# Patient Record
Sex: Male | Born: 1985 | Race: White | Hispanic: No | Marital: Single | Smoking: Current every day smoker
Health system: Southern US, Community
[De-identification: ages and names within clinical notes are randomized; demographics above are authoritative.]

## PROBLEM LIST (undated history)

## (undated) ENCOUNTER — Emergency Department (HOSPITAL_COMMUNITY): Payer: Self-pay

## (undated) DIAGNOSIS — F329 Major depressive disorder, single episode, unspecified: Secondary | ICD-10-CM

## (undated) DIAGNOSIS — F32A Depression, unspecified: Secondary | ICD-10-CM

## (undated) DIAGNOSIS — F1010 Alcohol abuse, uncomplicated: Secondary | ICD-10-CM

## (undated) DIAGNOSIS — T7840XA Allergy, unspecified, initial encounter: Secondary | ICD-10-CM

## (undated) DIAGNOSIS — Z9189 Other specified personal risk factors, not elsewhere classified: Secondary | ICD-10-CM

## (undated) DIAGNOSIS — F1910 Other psychoactive substance abuse, uncomplicated: Secondary | ICD-10-CM

## (undated) DIAGNOSIS — F909 Attention-deficit hyperactivity disorder, unspecified type: Secondary | ICD-10-CM

## (undated) DIAGNOSIS — I509 Heart failure, unspecified: Secondary | ICD-10-CM

## (undated) HISTORY — DX: Allergy, unspecified, initial encounter: T78.40XA

---
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
## 2006-12-12 ENCOUNTER — Emergency Department (HOSPITAL_COMMUNITY): Admission: EM | Admit: 2006-12-12 | Discharge: 2006-12-12 | Payer: Self-pay | Admitting: Emergency Medicine

## 2007-04-10 ENCOUNTER — Emergency Department (HOSPITAL_COMMUNITY): Admission: EM | Admit: 2007-04-10 | Discharge: 2007-04-10 | Payer: Self-pay | Admitting: Emergency Medicine

## 2007-11-15 ENCOUNTER — Emergency Department (HOSPITAL_COMMUNITY): Admission: EM | Admit: 2007-11-15 | Discharge: 2007-11-15 | Payer: Self-pay | Admitting: Emergency Medicine

## 2010-06-16 ENCOUNTER — Emergency Department (HOSPITAL_COMMUNITY)
Admission: EM | Admit: 2010-06-16 | Discharge: 2010-06-17 | Disposition: A | Discharge status: Other Acute Inpatient Hospital | Payer: Self-pay | Attending: Emergency Medicine | Admitting: Emergency Medicine

## 2010-06-16 DIAGNOSIS — R45851 Suicidal ideations: Secondary | ICD-10-CM | POA: Insufficient documentation

## 2010-06-16 DIAGNOSIS — IMO0002 Reserved for concepts with insufficient information to code with codable children: Secondary | ICD-10-CM | POA: Insufficient documentation

## 2010-06-16 DIAGNOSIS — F1010 Alcohol abuse, uncomplicated: Secondary | ICD-10-CM | POA: Insufficient documentation

## 2010-06-16 DIAGNOSIS — F1020 Alcohol dependence, uncomplicated: Secondary | ICD-10-CM

## 2010-06-16 LAB — ETHANOL

## 2010-06-16 LAB — CBC
MCH: 32.2 pg (ref 26.0–34.0)
Platelets: 272 10*3/uL (ref 150–400)
RBC: 4.9 MIL/uL (ref 4.22–5.81)
RDW: 12.2 % (ref 11.5–15.5)

## 2010-06-16 LAB — RAPID URINE DRUG SCREEN, HOSP PERFORMED
Amphetamines: NOT DETECTED
Benzodiazepines: POSITIVE — AB
Cocaine: NOT DETECTED
Tetrahydrocannabinol: POSITIVE — AB

## 2010-06-16 LAB — DIFFERENTIAL
Basophils Relative: 1 % (ref 0–1)
Eosinophils Absolute: 0.6 10*3/uL (ref 0.0–0.7)
Monocytes Relative: 6 % (ref 3–12)
Neutrophils Relative %: 48 % (ref 43–77)

## 2010-06-16 LAB — COMPREHENSIVE METABOLIC PANEL
AST: 27 U/L (ref 0–37)
Albumin: 4.1 g/dL (ref 3.5–5.2)
Calcium: 9.1 mg/dL (ref 8.4–10.5)
Chloride: 110 mEq/L (ref 96–112)
Creatinine, Ser: 0.86 mg/dL (ref 0.4–1.5)
Total Bilirubin: 0.6 mg/dL (ref 0.3–1.2)
Total Protein: 7.3 g/dL (ref 6.0–8.3)

## 2010-06-17 ENCOUNTER — Inpatient Hospital Stay (HOSPITAL_COMMUNITY)
Admission: AD | Admit: 2010-06-17 | Discharge: 2010-06-19 | DRG: 897 | Disposition: A | Discharge status: Home / Self Care | Payer: PRIVATE HEALTH INSURANCE | Source: Ambulatory Visit | Attending: Psychiatry | Admitting: Psychiatry

## 2010-06-17 DIAGNOSIS — Z560 Unemployment, unspecified: Secondary | ICD-10-CM

## 2010-06-17 DIAGNOSIS — F1020 Alcohol dependence, uncomplicated: Secondary | ICD-10-CM

## 2010-06-17 DIAGNOSIS — F909 Attention-deficit hyperactivity disorder, unspecified type: Secondary | ICD-10-CM

## 2010-06-17 DIAGNOSIS — F411 Generalized anxiety disorder: Secondary | ICD-10-CM

## 2010-06-17 DIAGNOSIS — J45909 Unspecified asthma, uncomplicated: Secondary | ICD-10-CM

## 2010-06-17 DIAGNOSIS — F329 Major depressive disorder, single episode, unspecified: Secondary | ICD-10-CM

## 2010-06-17 DIAGNOSIS — F1920 Other psychoactive substance dependence, uncomplicated: Principal | ICD-10-CM

## 2010-06-17 DIAGNOSIS — Z5900 Homelessness unspecified: Secondary | ICD-10-CM

## 2010-06-17 DIAGNOSIS — F3289 Other specified depressive episodes: Secondary | ICD-10-CM

## 2010-06-17 DIAGNOSIS — Z6379 Other stressful life events affecting family and household: Secondary | ICD-10-CM

## 2010-06-17 DIAGNOSIS — F1994 Other psychoactive substance use, unspecified with psychoactive substance-induced mood disorder: Secondary | ICD-10-CM

## 2010-06-18 DIAGNOSIS — F988 Other specified behavioral and emotional disorders with onset usually occurring in childhood and adolescence: Secondary | ICD-10-CM

## 2010-06-18 DIAGNOSIS — F329 Major depressive disorder, single episode, unspecified: Secondary | ICD-10-CM

## 2010-06-18 DIAGNOSIS — F411 Generalized anxiety disorder: Secondary | ICD-10-CM

## 2010-06-18 DIAGNOSIS — F3289 Other specified depressive episodes: Secondary | ICD-10-CM

## 2010-06-18 DIAGNOSIS — F1020 Alcohol dependence, uncomplicated: Secondary | ICD-10-CM
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
## 2010-08-16 ENCOUNTER — Emergency Department (HOSPITAL_COMMUNITY)
Admission: EM | Admit: 2010-08-16 | Discharge: 2010-08-16 | Disposition: A | Discharge status: Home / Self Care | Payer: Self-pay | Attending: Emergency Medicine | Admitting: Emergency Medicine

## 2010-08-16 DIAGNOSIS — F329 Major depressive disorder, single episode, unspecified: Secondary | ICD-10-CM | POA: Insufficient documentation

## 2010-08-16 DIAGNOSIS — F3289 Other specified depressive episodes: Secondary | ICD-10-CM | POA: Insufficient documentation

## 2010-08-16 DIAGNOSIS — J45901 Unspecified asthma with (acute) exacerbation: Secondary | ICD-10-CM | POA: Insufficient documentation

## 2010-08-16 DIAGNOSIS — R0789 Other chest pain: Secondary | ICD-10-CM | POA: Insufficient documentation

## 2010-08-20 ENCOUNTER — Emergency Department (HOSPITAL_COMMUNITY)
Admission: EM | Admit: 2010-08-20 | Discharge: 2010-08-20 | Disposition: A | Discharge status: Home / Self Care | Payer: PRIVATE HEALTH INSURANCE | Attending: Emergency Medicine | Admitting: Emergency Medicine

## 2010-08-20 DIAGNOSIS — Z79899 Other long term (current) drug therapy: Secondary | ICD-10-CM | POA: Insufficient documentation

## 2010-08-20 DIAGNOSIS — R0989 Other specified symptoms and signs involving the circulatory and respiratory systems: Secondary | ICD-10-CM | POA: Insufficient documentation

## 2010-08-20 DIAGNOSIS — R0609 Other forms of dyspnea: Secondary | ICD-10-CM | POA: Insufficient documentation

## 2010-08-20 DIAGNOSIS — J45909 Unspecified asthma, uncomplicated: Secondary | ICD-10-CM | POA: Insufficient documentation

## 2010-08-20 DIAGNOSIS — F17200 Nicotine dependence, unspecified, uncomplicated: Secondary | ICD-10-CM | POA: Insufficient documentation

## 2010-08-20 DIAGNOSIS — R059 Cough, unspecified: Secondary | ICD-10-CM | POA: Insufficient documentation

## 2010-08-20 DIAGNOSIS — R05 Cough: Secondary | ICD-10-CM | POA: Insufficient documentation

## 2010-08-20 DIAGNOSIS — R0682 Tachypnea, not elsewhere classified: Secondary | ICD-10-CM | POA: Insufficient documentation

## 2010-08-20 DIAGNOSIS — F341 Dysthymic disorder: Secondary | ICD-10-CM | POA: Insufficient documentation

## 2010-09-11 ENCOUNTER — Emergency Department (HOSPITAL_COMMUNITY)
Admission: EM | Admit: 2010-09-11 | Discharge: 2010-09-11 | Disposition: A | Discharge status: Home / Self Care | Payer: PRIVATE HEALTH INSURANCE | Attending: Emergency Medicine | Admitting: Emergency Medicine

## 2010-09-11 DIAGNOSIS — J45909 Unspecified asthma, uncomplicated: Secondary | ICD-10-CM | POA: Insufficient documentation

## 2010-09-11 DIAGNOSIS — F17200 Nicotine dependence, unspecified, uncomplicated: Secondary | ICD-10-CM | POA: Insufficient documentation

## 2010-09-17 ENCOUNTER — Emergency Department (HOSPITAL_COMMUNITY): Payer: Self-pay

## 2010-09-17 ENCOUNTER — Emergency Department (HOSPITAL_COMMUNITY)
Admission: EM | Admit: 2010-09-17 | Discharge: 2010-09-17 | Disposition: A | Discharge status: Home / Self Care | Payer: Self-pay | Attending: Emergency Medicine | Admitting: Emergency Medicine

## 2010-09-17 DIAGNOSIS — J45909 Unspecified asthma, uncomplicated: Secondary | ICD-10-CM | POA: Insufficient documentation

## 2010-09-17 DIAGNOSIS — F329 Major depressive disorder, single episode, unspecified: Secondary | ICD-10-CM | POA: Insufficient documentation

## 2010-09-17 DIAGNOSIS — F3289 Other specified depressive episodes: Secondary | ICD-10-CM | POA: Insufficient documentation

## 2010-09-17 DIAGNOSIS — Z79899 Other long term (current) drug therapy: Secondary | ICD-10-CM | POA: Insufficient documentation

## 2010-10-15 ENCOUNTER — Emergency Department (HOSPITAL_COMMUNITY)
Admission: EM | Admit: 2010-10-15 | Discharge: 2010-10-15 | Disposition: A | Discharge status: Home / Self Care | Payer: Self-pay | Attending: Emergency Medicine | Admitting: Emergency Medicine

## 2010-10-15 DIAGNOSIS — J45909 Unspecified asthma, uncomplicated: Secondary | ICD-10-CM | POA: Insufficient documentation

## 2010-10-15 DIAGNOSIS — J40 Bronchitis, not specified as acute or chronic: Secondary | ICD-10-CM | POA: Insufficient documentation

## 2010-10-15 DIAGNOSIS — R059 Cough, unspecified: Secondary | ICD-10-CM | POA: Insufficient documentation

## 2010-10-15 DIAGNOSIS — F17200 Nicotine dependence, unspecified, uncomplicated: Secondary | ICD-10-CM | POA: Insufficient documentation

## 2010-10-15 DIAGNOSIS — R05 Cough: Secondary | ICD-10-CM | POA: Insufficient documentation

## 2010-11-17 ENCOUNTER — Emergency Department (HOSPITAL_COMMUNITY)
Admission: EM | Admit: 2010-11-17 | Discharge: 2010-11-17 | Disposition: A | Discharge status: Home / Self Care | Payer: Self-pay | Attending: Emergency Medicine | Admitting: Emergency Medicine

## 2010-11-17 DIAGNOSIS — J45909 Unspecified asthma, uncomplicated: Secondary | ICD-10-CM | POA: Insufficient documentation

## 2010-11-17 DIAGNOSIS — F3289 Other specified depressive episodes: Secondary | ICD-10-CM | POA: Insufficient documentation

## 2010-11-17 DIAGNOSIS — R0602 Shortness of breath: Secondary | ICD-10-CM | POA: Insufficient documentation

## 2010-11-17 DIAGNOSIS — F329 Major depressive disorder, single episode, unspecified: Secondary | ICD-10-CM | POA: Insufficient documentation

## 2010-11-21 ENCOUNTER — Emergency Department (HOSPITAL_COMMUNITY)
Admission: EM | Admit: 2010-11-21 | Discharge: 2010-11-21 | Disposition: A | Discharge status: Home / Self Care | Payer: Self-pay | Attending: Emergency Medicine | Admitting: Emergency Medicine

## 2010-11-21 ENCOUNTER — Emergency Department (HOSPITAL_COMMUNITY): Payer: Self-pay

## 2010-11-21 DIAGNOSIS — R05 Cough: Secondary | ICD-10-CM | POA: Insufficient documentation

## 2010-11-21 DIAGNOSIS — R0602 Shortness of breath: Secondary | ICD-10-CM | POA: Insufficient documentation

## 2010-11-21 DIAGNOSIS — R059 Cough, unspecified: Secondary | ICD-10-CM | POA: Insufficient documentation

## 2010-11-21 DIAGNOSIS — J45901 Unspecified asthma with (acute) exacerbation: Secondary | ICD-10-CM | POA: Insufficient documentation

## 2010-12-02 ENCOUNTER — Emergency Department (HOSPITAL_COMMUNITY)
Admission: EM | Admit: 2010-12-02 | Discharge: 2010-12-02 | Disposition: A | Discharge status: Home / Self Care | Payer: Self-pay | Attending: Emergency Medicine | Admitting: Emergency Medicine

## 2010-12-02 DIAGNOSIS — J45909 Unspecified asthma, uncomplicated: Secondary | ICD-10-CM | POA: Insufficient documentation

## 2010-12-02 DIAGNOSIS — F17200 Nicotine dependence, unspecified, uncomplicated: Secondary | ICD-10-CM | POA: Insufficient documentation

## 2010-12-05 ENCOUNTER — Emergency Department (HOSPITAL_COMMUNITY)
Admission: EM | Admit: 2010-12-05 | Discharge: 2010-12-05 | Discharge status: Against Medical Advice | Payer: Self-pay | Attending: Emergency Medicine | Admitting: Emergency Medicine

## 2010-12-05 DIAGNOSIS — J45909 Unspecified asthma, uncomplicated: Secondary | ICD-10-CM | POA: Insufficient documentation

## 2010-12-05 DIAGNOSIS — F3289 Other specified depressive episodes: Secondary | ICD-10-CM | POA: Insufficient documentation

## 2010-12-05 DIAGNOSIS — F17200 Nicotine dependence, unspecified, uncomplicated: Secondary | ICD-10-CM | POA: Insufficient documentation

## 2010-12-05 DIAGNOSIS — F329 Major depressive disorder, single episode, unspecified: Secondary | ICD-10-CM | POA: Insufficient documentation

## 2010-12-13 ENCOUNTER — Emergency Department (HOSPITAL_COMMUNITY)
Admission: EM | Admit: 2010-12-13 | Discharge: 2010-12-14 | Disposition: A | Discharge status: Home / Self Care | Payer: Self-pay | Attending: Emergency Medicine | Admitting: Emergency Medicine

## 2010-12-13 DIAGNOSIS — R0609 Other forms of dyspnea: Secondary | ICD-10-CM | POA: Insufficient documentation

## 2010-12-13 DIAGNOSIS — F329 Major depressive disorder, single episode, unspecified: Secondary | ICD-10-CM | POA: Insufficient documentation

## 2010-12-13 DIAGNOSIS — J45901 Unspecified asthma with (acute) exacerbation: Secondary | ICD-10-CM | POA: Insufficient documentation

## 2010-12-13 DIAGNOSIS — R0989 Other specified symptoms and signs involving the circulatory and respiratory systems: Secondary | ICD-10-CM | POA: Insufficient documentation

## 2010-12-13 DIAGNOSIS — F3289 Other specified depressive episodes: Secondary | ICD-10-CM | POA: Insufficient documentation

## 2010-12-18 ENCOUNTER — Emergency Department (HOSPITAL_COMMUNITY)
Admission: EM | Admit: 2010-12-18 | Discharge: 2010-12-18 | Disposition: A | Discharge status: Home / Self Care | Payer: Self-pay | Attending: Emergency Medicine | Admitting: Emergency Medicine

## 2010-12-18 DIAGNOSIS — J45909 Unspecified asthma, uncomplicated: Secondary | ICD-10-CM | POA: Insufficient documentation

## 2010-12-18 DIAGNOSIS — R05 Cough: Secondary | ICD-10-CM | POA: Insufficient documentation

## 2010-12-18 DIAGNOSIS — R059 Cough, unspecified: Secondary | ICD-10-CM | POA: Insufficient documentation

## 2010-12-25 ENCOUNTER — Emergency Department (HOSPITAL_COMMUNITY)
Admission: EM | Admit: 2010-12-25 | Discharge: 2010-12-25 | Disposition: A | Discharge status: Home / Self Care | Payer: Self-pay | Attending: Emergency Medicine | Admitting: Emergency Medicine

## 2010-12-25 DIAGNOSIS — F3289 Other specified depressive episodes: Secondary | ICD-10-CM | POA: Insufficient documentation

## 2010-12-25 DIAGNOSIS — R0609 Other forms of dyspnea: Secondary | ICD-10-CM | POA: Insufficient documentation

## 2010-12-25 DIAGNOSIS — R05 Cough: Secondary | ICD-10-CM | POA: Insufficient documentation

## 2010-12-25 DIAGNOSIS — J45909 Unspecified asthma, uncomplicated: Secondary | ICD-10-CM | POA: Insufficient documentation

## 2010-12-25 DIAGNOSIS — R0602 Shortness of breath: Secondary | ICD-10-CM | POA: Insufficient documentation

## 2010-12-25 DIAGNOSIS — R059 Cough, unspecified: Secondary | ICD-10-CM | POA: Insufficient documentation

## 2010-12-25 DIAGNOSIS — Z79899 Other long term (current) drug therapy: Secondary | ICD-10-CM | POA: Insufficient documentation

## 2010-12-25 DIAGNOSIS — R0989 Other specified symptoms and signs involving the circulatory and respiratory systems: Secondary | ICD-10-CM | POA: Insufficient documentation

## 2010-12-25 DIAGNOSIS — F329 Major depressive disorder, single episode, unspecified: Secondary | ICD-10-CM | POA: Insufficient documentation

## 2010-12-26 ENCOUNTER — Emergency Department (HOSPITAL_COMMUNITY)
Admission: EM | Admit: 2010-12-26 | Discharge: 2010-12-26 | Disposition: A | Discharge status: Home / Self Care | Payer: Self-pay | Attending: Emergency Medicine | Admitting: Emergency Medicine

## 2010-12-26 DIAGNOSIS — J45909 Unspecified asthma, uncomplicated: Secondary | ICD-10-CM | POA: Insufficient documentation

## 2010-12-26 DIAGNOSIS — F3289 Other specified depressive episodes: Secondary | ICD-10-CM | POA: Insufficient documentation

## 2010-12-26 DIAGNOSIS — R109 Unspecified abdominal pain: Secondary | ICD-10-CM | POA: Insufficient documentation

## 2010-12-26 DIAGNOSIS — F329 Major depressive disorder, single episode, unspecified: Secondary | ICD-10-CM | POA: Insufficient documentation

## 2011-01-14 ENCOUNTER — Emergency Department (HOSPITAL_COMMUNITY): Payer: Self-pay

## 2011-01-14 ENCOUNTER — Encounter: Payer: Self-pay | Admitting: *Deleted

## 2011-01-14 ENCOUNTER — Emergency Department (HOSPITAL_COMMUNITY)
Admission: EM | Admit: 2011-01-14 | Discharge: 2011-01-14 | Disposition: A | Discharge status: Home / Self Care | Payer: Self-pay | Attending: Emergency Medicine | Admitting: Emergency Medicine

## 2011-01-14 DIAGNOSIS — F909 Attention-deficit hyperactivity disorder, unspecified type: Secondary | ICD-10-CM | POA: Insufficient documentation

## 2011-01-14 DIAGNOSIS — R0602 Shortness of breath: Secondary | ICD-10-CM | POA: Insufficient documentation

## 2011-01-14 DIAGNOSIS — R05 Cough: Secondary | ICD-10-CM | POA: Insufficient documentation

## 2011-01-14 DIAGNOSIS — J45909 Unspecified asthma, uncomplicated: Secondary | ICD-10-CM | POA: Insufficient documentation

## 2011-01-14 DIAGNOSIS — R059 Cough, unspecified: Secondary | ICD-10-CM | POA: Insufficient documentation

## 2011-01-14 HISTORY — DX: Attention-deficit hyperactivity disorder, unspecified type: F90.9

## 2011-01-14 HISTORY — DX: Depression, unspecified: F32.A

## 2011-01-14 HISTORY — DX: Major depressive disorder, single episode, unspecified: F32.9

## 2011-01-14 MED ORDER — ALBUTEROL SULFATE HFA 108 (90 BASE) MCG/ACT IN AERS: 2.0000 | INHALATION_SPRAY | RESPIRATORY_TRACT | Status: DC | PRN

## 2011-01-14 MED ORDER — PREDNISONE 10 MG PO TABS: 20.0000 mg | ORAL_TABLET | Freq: Every day | ORAL | Status: AC

## 2011-01-14 MED ORDER — CETIRIZINE-PSEUDOEPHEDRINE ER 5-120 MG PO TB12: 1.0000 | ORAL_TABLET | Freq: Every day | ORAL | Status: AC

## 2011-01-14 MED ORDER — ALBUTEROL SULFATE HFA 108 (90 BASE) MCG/ACT IN AERS
2.0000 | INHALATION_SPRAY | Freq: Once | RESPIRATORY_TRACT | Status: AC
  Administered 2011-01-14: 2 via RESPIRATORY_TRACT
  Filled 2011-01-14: qty 6.7

## 2011-01-14 MED ORDER — IPRATROPIUM BROMIDE 0.02 % IN SOLN
0.5000 mg | Freq: Once | RESPIRATORY_TRACT | Status: AC
  Administered 2011-01-14: 0.5 mg via RESPIRATORY_TRACT

## 2011-01-14 MED ORDER — PREDNISONE 20 MG PO TABS
60.0000 mg | ORAL_TABLET | Freq: Once | ORAL | Status: AC
  Administered 2011-01-14: 60 mg via ORAL
  Filled 2011-01-14: qty 3

## 2011-01-14 MED ORDER — ALBUTEROL SULFATE (5 MG/ML) 0.5% IN NEBU
2.5000 mg | INHALATION_SOLUTION | Freq: Once | RESPIRATORY_TRACT | Status: AC
  Administered 2011-01-14: 2.5 mg via RESPIRATORY_TRACT

## 2011-01-14 NOTE — ED Notes (Signed)
Call to radiology regarding delay for chest xray

## 2011-01-14 NOTE — ED Notes (Signed)
Sx. Since yesterday. Exhausted albuterol

## 2011-01-14 NOTE — ED Notes (Signed)
Patient is resting comfortably. No c/o SOB, pending xray

## 2011-01-14 NOTE — ED Provider Notes (Signed)
History     CSN: [id number] Arrival date & time: 01/14/2011  8:09 AM   First MD Initiated Contact with Patient 01/14/11 [phone]      Chief Complaint  Patient presents with  . Asthma    (Consider location/radiation/quality/duration/timing/severity/associated sxs/prior treatment) Patient is a 25 y.o. male presenting with wheezing.  Wheezing  Associated symptoms include wheezing.   Patient presents to the emergency department with complaints of asthma. The patient states that his wheezing is not able to be controlled at home with his albuterol inhaler. Patient has been here frequently for this problem. He has been smoking for 10 years and states that he knows this is not the cause of his asthma and breathing problems in the he will not quit. He also states he is allergic to cats, but sleeps with his cat. And feels this may also have something to do with it. He also states that his house has mold and he's allergic to mold and this may be causing the exacerbation as well. Past Medical History  Diagnosis Date  . Asthma   . Depressed   . ADHD (attention deficit hyperactivity disorder)     No past surgical history on file.  Family History  Problem Relation Age of Onset  . COPD Other     History  Substance Use Topics  . Smoking status: Current Everyday Smoker -- 10 years    Types: Cigarettes  . Smokeless tobacco: Not on file  . Alcohol Use: No      Review of Systems  Respiratory: Positive for wheezing.   All other systems reviewed and are negative.    Allergies  Review of patient's allergies indicates no known allergies.  Home Medications   Current Outpatient Rx  Name Route Sig Dispense Refill  . ALBUTEROL SULFATE HFA 108 (90 BASE) MCG/ACT IN AERS Inhalation Inhale 2 puffs into the lungs every hour as needed. For wheezing/shortness of breath       BP 109/64  Pulse 57  Temp(Src) 97.9 F (36.6 C) (Oral)  Resp 24  SpO2 95%  Physical Exam  Vitals  reviewed. Constitutional: He appears well-developed and well-nourished.  HENT:  Head: Normocephalic and atraumatic.  Eyes: Conjunctivae are normal. Pupils are equal, round, and reactive to light.  Neck: Trachea normal, normal range of motion and full passive range of motion without pain. Neck supple.  Cardiovascular: Normal rate and normal pulses.   Pulmonary/Chest: Effort normal. He has wheezes in the right upper field, the right middle field, the right lower field, the left upper field, the left middle field and the left lower field.  Abdominal: Soft. Normal appearance and bowel sounds are normal.  Lymphadenopathy:       Head (right side): No tonsillar and no preauricular adenopathy present.       Head (left side): No tonsillar and no preauricular adenopathy present.  Skin: Skin is warm, dry and intact.    ED Course  Procedures (including critical care time)  Labs Reviewed - No data to display No results found.      DG Chest 2 View (Final result)   Result time:01/14/11 1247    Final result by Rad Results In Interface (01/14/11 12:47:31)    Narrative:   *RADIOLOGY REPORT*  Clinical Data: Cough, shortness of breath.  CHEST - 2 VIEW  Comparison: 11/21/2010  Findings: There is hyperinflation of the lungs. Heart and mediastinal contours are within normal limits. No focal opacities or effusions. No acute bony abnormality.  IMPRESSION: Hyperinflation. No  active disease.  Original Report Authenticated By: Cyndie Chime, M.D.    No diagnosis found.    MDM  Xray result  negative for pneumonia.        Dorthula Matas, PA 01/20/11 [phone]

## 2011-01-14 NOTE — ED Provider Notes (Signed)
Medical screening examination/treatment/procedure(s) were performed by non-physician practitioner and as supervising physician I was immediately available for consultation/collaboration.  Nicholes Stairs, MD 01/14/11 [phone]

## 2011-01-22 NOTE — ED Provider Notes (Signed)
Medical screening examination/treatment/procedure(s) were performed by non-physician practitioner and as supervising physician I was immediately available for consultation/collaboration.  Nicholes Stairs, MD 01/22/11 1513

## 2011-04-15 ENCOUNTER — Encounter (HOSPITAL_COMMUNITY): Payer: Self-pay | Admitting: *Deleted

## 2011-04-15 ENCOUNTER — Emergency Department (HOSPITAL_COMMUNITY)
Admission: EM | Admit: 2011-04-15 | Discharge: 2011-04-15 | Disposition: A | Discharge status: Home / Self Care | Payer: Self-pay | Attending: Emergency Medicine | Admitting: Emergency Medicine

## 2011-04-15 DIAGNOSIS — R05 Cough: Secondary | ICD-10-CM | POA: Insufficient documentation

## 2011-04-15 DIAGNOSIS — J45909 Unspecified asthma, uncomplicated: Secondary | ICD-10-CM | POA: Insufficient documentation

## 2011-04-15 DIAGNOSIS — R0609 Other forms of dyspnea: Secondary | ICD-10-CM | POA: Insufficient documentation

## 2011-04-15 DIAGNOSIS — F909 Attention-deficit hyperactivity disorder, unspecified type: Secondary | ICD-10-CM | POA: Insufficient documentation

## 2011-04-15 DIAGNOSIS — R0602 Shortness of breath: Secondary | ICD-10-CM | POA: Insufficient documentation

## 2011-04-15 DIAGNOSIS — R059 Cough, unspecified: Secondary | ICD-10-CM | POA: Insufficient documentation

## 2011-04-15 DIAGNOSIS — R0989 Other specified symptoms and signs involving the circulatory and respiratory systems: Secondary | ICD-10-CM | POA: Insufficient documentation

## 2011-04-15 DIAGNOSIS — F329 Major depressive disorder, single episode, unspecified: Secondary | ICD-10-CM | POA: Insufficient documentation

## 2011-04-15 DIAGNOSIS — F3289 Other specified depressive episodes: Secondary | ICD-10-CM | POA: Insufficient documentation

## 2011-04-15 MED ORDER — ALBUTEROL SULFATE HFA 108 (90 BASE) MCG/ACT IN AERS
2.0000 | INHALATION_SPRAY | Freq: Once | RESPIRATORY_TRACT | Status: AC
  Administered 2011-04-15: 2 via RESPIRATORY_TRACT
  Filled 2011-04-15: qty 6.7

## 2011-04-15 MED ORDER — PREDNISONE 20 MG PO TABS
60.0000 mg | ORAL_TABLET | Freq: Once | ORAL | Status: AC
  Administered 2011-04-15: 60 mg via ORAL
  Filled 2011-04-15: qty 3

## 2011-04-15 MED ORDER — ALBUTEROL SULFATE HFA 108 (90 BASE) MCG/ACT IN AERS: 2.0000 | INHALATION_SPRAY | RESPIRATORY_TRACT | Status: DC | PRN

## 2011-04-15 MED ORDER — ALBUTEROL SULFATE (5 MG/ML) 0.5% IN NEBU
INHALATION_SOLUTION | RESPIRATORY_TRACT | Status: AC
  Filled 2011-04-15: qty 1

## 2011-04-15 MED ORDER — ALBUTEROL SULFATE (5 MG/ML) 0.5% IN NEBU
2.5000 mg | INHALATION_SOLUTION | Freq: Once | RESPIRATORY_TRACT | Status: AC
  Administered 2011-04-15: 2.5 mg via RESPIRATORY_TRACT
  Filled 2011-04-15: qty 0.5

## 2011-04-15 MED ORDER — ALBUTEROL SULFATE (5 MG/ML) 0.5% IN NEBU
2.5000 mg | INHALATION_SOLUTION | Freq: Once | RESPIRATORY_TRACT | Status: AC
  Administered 2011-04-15: 5 mg via RESPIRATORY_TRACT

## 2011-04-15 NOTE — ED Notes (Signed)
Feels better following neb tx, spo2 is 97%

## 2011-04-15 NOTE — ED Provider Notes (Signed)
History     CSN: [id number]  Arrival date & time 04/15/11  0748   First MD Initiated Contact with Patient 04/15/11 0802      Chief Complaint  Patient presents with  . Asthma    (Consider location/radiation/quality/duration/timing/severity/associated sxs/prior treatment) HPI Comments: History of asthma who uses his inhaler nightly presenting with wheezing and difficulty breathing since last night. States he is out of his inhaler. Denies any chest pain, fever, abdominal pain nausea or vomiting. He states he is cutting down on his smoking and has moved out of the house that had mold in it.  No previous admissions for asthma.  Patient is a 26 y.o. male presenting with asthma. The history is provided by the patient.  Asthma This is a new problem. The current episode started yesterday. The problem occurs constantly. The problem has been gradually improving. Associated symptoms include shortness of breath. Pertinent negatives include no chest pain, no abdominal pain and no headaches. The symptoms are aggravated by smoking and coughing. The symptoms are relieved by nothing. He has tried nothing for the symptoms.    Past Medical History  Diagnosis Date  . Asthma   . Depressed   . ADHD (attention deficit hyperactivity disorder)     History reviewed. No pertinent past surgical history.  Family History  Problem Relation Age of Onset  . COPD Other     History  Substance Use Topics  . Smoking status: Current Everyday Smoker -- 10 years    Types: Cigarettes  . Smokeless tobacco: Not on file  . Alcohol Use: No      Review of Systems  Constitutional: Negative for fever, activity change and appetite change.  HENT: Negative for congestion, sore throat and rhinorrhea.   Eyes: Negative for visual disturbance.  Respiratory: Positive for cough and shortness of breath.   Cardiovascular: Negative for chest pain.  Gastrointestinal: Negative for nausea, vomiting and abdominal pain.    Genitourinary: Negative for hematuria.  Musculoskeletal: Negative for back pain.  Skin: Negative for rash.  Neurological: Negative for headaches.    Allergies  Review of patient's allergies indicates no known allergies.  Home Medications   Current Outpatient Rx  Name Route Sig Dispense Refill  . ALBUTEROL SULFATE HFA 108 (90 BASE) MCG/ACT IN AERS Inhalation Inhale 2 puffs into the lungs every 4 (four) hours as needed. For wheezing    . CETIRIZINE-PSEUDOEPHEDRINE ER 5-120 MG PO TB12 Oral Take 1 tablet by mouth daily. 30 tablet 0  . FLUTICASONE-SALMETEROL 250-50 MCG/DOSE IN AEPB Inhalation Inhale 1 puff into the lungs every 12 (twelve) hours.    . ALBUTEROL SULFATE HFA 108 (90 BASE) MCG/ACT IN AERS Inhalation Inhale 2 puffs into the lungs every 4 (four) hours as needed for wheezing. 1 Inhaler 0    BP 113/68  Pulse 78  Resp 22  SpO2 97%  Physical Exam  Constitutional: He appears well-developed and well-nourished. No distress.  HENT:  Head: Normocephalic and atraumatic.  Mouth/Throat: Oropharynx is clear and moist. No oropharyngeal exudate.  Eyes: Conjunctivae are normal. Pupils are equal, round, and reactive to light.  Neck: Normal range of motion. Neck supple.  Cardiovascular: Normal rate, regular rhythm and normal heart sounds.   Pulmonary/Chest: Effort normal. No respiratory distress. He has wheezes.  Abdominal: Soft. There is no tenderness. There is no rebound and no guarding.  Musculoskeletal: Normal range of motion. He exhibits no edema and no tenderness.  Neurological: He is alert. No cranial nerve deficit.  Skin: Skin is  warm.    ED Course  Procedures (including critical care time)  Labs Reviewed - No data to display No results found.   1. Asthma       MDM  Asthma attack secondary to being out of inhalers. He is alert he has already received one albuterol treatment before my evaluation. He is good air exchange with mostly clear lungs have a few scattered  wheezes  Improvement with nebs.  No distress.  Normal pulse ox with ambulation.  Will refill albuterol.  Patient states has advair.     Glynn Octave, MD 04/15/11 [phone]

## 2011-04-15 NOTE — ED Notes (Signed)
Pt having asthma attack, audible wheezing at triage, is out of inhaler. spo2 95% on room air.

## 2014-02-01 ENCOUNTER — Ambulatory Visit (INDEPENDENT_AMBULATORY_CARE_PROVIDER_SITE_OTHER): Payer: BC Managed Care – PPO | Admitting: Emergency Medicine

## 2014-02-01 VITALS — BP 120/80 | HR 84 | Temp 97.6°F | Resp 18 | Ht 71.0 in | Wt 133.6 lb

## 2014-02-01 DIAGNOSIS — F17200 Nicotine dependence, unspecified, uncomplicated: Secondary | ICD-10-CM | POA: Insufficient documentation

## 2014-02-01 DIAGNOSIS — Z720 Tobacco use: Secondary | ICD-10-CM

## 2014-02-01 DIAGNOSIS — J4521 Mild intermittent asthma with (acute) exacerbation: Secondary | ICD-10-CM

## 2014-02-01 DIAGNOSIS — J209 Acute bronchitis, unspecified: Secondary | ICD-10-CM

## 2014-02-01 MED ORDER — HYDROCOD POLST-CHLORPHEN POLST 10-8 MG/5ML PO LQCR: 5.0000 mL | Freq: Two times a day (BID) | ORAL | Status: DC | PRN

## 2014-02-01 MED ORDER — IPRATROPIUM BROMIDE 0.02 % IN SOLN
0.5000 mg | Freq: Once | RESPIRATORY_TRACT | Status: AC
  Administered 2014-02-01: 0.5 mg via RESPIRATORY_TRACT

## 2014-02-01 MED ORDER — ALBUTEROL SULFATE (2.5 MG/3ML) 0.083% IN NEBU
5.0000 mg | INHALATION_SOLUTION | Freq: Once | RESPIRATORY_TRACT | Status: AC
  Administered 2014-02-01: 5 mg via RESPIRATORY_TRACT

## 2014-02-01 MED ORDER — ALBUTEROL SULFATE (2.5 MG/3ML) 0.083% IN NEBU: 5.0000 mg | INHALATION_SOLUTION | RESPIRATORY_TRACT | Status: DC | PRN

## 2014-02-01 MED ORDER — ALBUTEROL SULFATE HFA 108 (90 BASE) MCG/ACT IN AERS: 2.0000 | INHALATION_SPRAY | RESPIRATORY_TRACT | Status: DC | PRN

## 2014-02-01 MED ORDER — VARENICLINE TARTRATE 1 MG PO TABS: 1.0000 mg | ORAL_TABLET | Freq: Two times a day (BID) | ORAL | Status: DC

## 2014-02-01 MED ORDER — AMOXICILLIN-POT CLAVULANATE 875-125 MG PO TABS: 1.0000 | ORAL_TABLET | Freq: Two times a day (BID) | ORAL | Status: DC

## 2014-02-01 MED ORDER — VARENICLINE TARTRATE 0.5 MG PO TABS: ORAL_TABLET | ORAL | Status: DC

## 2014-02-01 MED ORDER — PSEUDOEPHEDRINE-GUAIFENESIN ER 60-600 MG PO TB12: 1.0000 | ORAL_TABLET | Freq: Two times a day (BID) | ORAL | Status: AC

## 2014-02-01 NOTE — Addendum Note (Signed)
Addended by: Ihor DowBYRD, Jasiel Apachito M on: 02/01/2014 11:01 AM   Modules accepted: Orders

## 2014-02-01 NOTE — Addendum Note (Signed)
Addended by: Ihor DowBYRD, Timiyah Romito M on: 02/01/2014 10:40 AM   Modules accepted: Orders

## 2014-02-01 NOTE — Addendum Note (Signed)
Addended by: Carmelina DaneANDERSON, Pollyann Roa S on: 02/01/2014 10:58 AM   Modules accepted: Orders

## 2014-02-01 NOTE — Progress Notes (Signed)
[facility] [address], [place] [id number] [phone]  Date:  02/01/2014   Name:  Alex [name]   DOB:  [birth year]   MRN:  [id number]  PCP:  No primary care provider on file.    Chief Complaint: Cough; chest congestion; Shortness of Breath; Sleeping Problem; Wheezing; and Chest Pain   History of Present Illness:  Alex [name] is a 28 y.o. very pleasant male patient who presents with the following:  Ill for 10 days with nasal congestion and drainage that is purulent.   Has cough productive of purulent sputum.  Interferes with sleep.  Asthma is worse. No fever or chills.  No nausea or vomiting.  No stool change Sore throat. No improvement with over the counter medications or other home remedies.  Denies other complaint or health concern today.   Patient Active Problem List   Diagnosis Date Noted  . ADHD (attention deficit hyperactivity disorder)     Past Medical History  Diagnosis Date  . Asthma   . Depressed   . ADHD (attention deficit hyperactivity disorder)   . Allergy     History reviewed. No pertinent past surgical history.  History  Substance Use Topics  . Smoking status: Current Every Day Smoker -- 10 years    Types: Cigarettes  . Smokeless tobacco: Not on file  . Alcohol Use: No    Family History  Problem Relation Age of Onset  . COPD Other     No Known Allergies  Medication list has been reviewed and updated.  Current Outpatient Prescriptions on File Prior to Visit  Medication Sig Dispense Refill  . albuterol (PROVENTIL HFA;VENTOLIN HFA) 108 (90 BASE) MCG/ACT inhaler Inhale 2 puffs into the lungs every 4 (four) hours as needed. For wheezing    . albuterol (PROVENTIL HFA;VENTOLIN HFA) 108 (90 BASE) MCG/ACT inhaler Inhale 2 puffs into the lungs every 4 (four) hours as needed for wheezing. 1 Inhaler 0   No current facility-administered medications on file prior to visit.    Review of Systems:  As per HPI, otherwise  negative.    Physical Examination: Filed Vitals:   02/01/14 0935  BP: 120/80  Pulse: 84  Temp: 97.6 F (36.4 C)  Resp: 18   Filed Vitals:   02/01/14 0935  Height: 5\' 11"  (1.803 m)  Weight: 133 lb 9.6 oz (60.601 kg)   Body mass index is 18.64 kg/(m^2). Ideal Body Weight: Weight in (lb) to have BMI = 25: 178.9  GEN: WDWN, NAD, Non-toxic, A & O x 3  Persistent cough HEENT: Atraumatic, Normocephalic. Neck supple. No masses, No LAD. Ears and Nose: No external deformity. CV: RRR, No M/G/R. No JVD. No thrill. No extra heart sounds. PULM: diffuse wheezing , no crackles, rhonchi. No retractions. No resp. distress. No accessory muscle use. ABD: S, NT, ND, +BS. No rebound. No HSM. EXTR: No c/c/e NEURO Normal gait.  PSYCH: Normally interactive. Conversant. Not depressed or anxious appearing.  Calm demeanor.    Assessment and Plan: Exacerbation asthma Bronchitis Sinusitis Smoking chantix Albuterol augmentin tussionex mucinex   Signed,  Phillips OdorJeffery Anderson, MD

## 2014-02-01 NOTE — Patient Instructions (Signed)
Smoking Cessation Quitting smoking is important to your health and has many advantages. However, it is not always easy to quit since nicotine is a very addictive drug. Oftentimes, people try 3 times or more before being able to quit. This document explains the best ways for you to prepare to quit smoking. Quitting takes hard work and a lot of effort, but you can do it. ADVANTAGES OF QUITTING SMOKING  You will live longer, feel better, and live better.  Your body will feel the impact of quitting smoking almost immediately.  Within 20 minutes, blood pressure decreases. Your pulse returns to its normal level.  After 8 hours, carbon monoxide levels in the blood return to normal. Your oxygen level increases.  After 24 hours, the chance of having a heart attack starts to decrease. Your breath, hair, and body stop smelling like smoke.  After 48 hours, damaged nerve endings begin to recover. Your sense of taste and smell improve.  After 72 hours, the body is virtually free of nicotine. Your bronchial tubes relax and breathing becomes easier.  After 2 to 12 weeks, lungs can hold more air. Exercise becomes easier and circulation improves.  The risk of having a heart attack, stroke, cancer, or lung disease is greatly reduced.  After 1 year, the risk of coronary heart disease is cut in half.  After 5 years, the risk of stroke falls to the same as a nonsmoker.  After 10 years, the risk of lung cancer is cut in half and the risk of other cancers decreases significantly.  After 15 years, the risk of coronary heart disease drops, usually to the level of a nonsmoker.  If you are pregnant, quitting smoking will improve your chances of having a healthy baby.  The people you live with, especially any children, will be healthier.  You will have extra money to spend on things other than cigarettes. QUESTIONS TO THINK ABOUT BEFORE ATTEMPTING TO QUIT You may want to talk about your answers with your  health care provider.  Why do you want to quit?  If you tried to quit in the past, what helped and what did not?  What will be the most difficult situations for you after you quit? How will you plan to handle them?  Who can help you through the tough times? Your family? Friends? A health care provider?  What pleasures do you get from smoking? What ways can you still get pleasure if you quit? Here are some questions to ask your health care provider:  How can you help me to be successful at quitting?  What medicine do you think would be best for me and how should I take it?  What should I do if I need more help?  What is smoking withdrawal like? How can I get information on withdrawal? GET READY  Set a quit date.  Change your environment by getting rid of all cigarettes, ashtrays, matches, and lighters in your home, car, or work. Do not let people smoke in your home.  Review your past attempts to quit. Think about what worked and what did not. GET SUPPORT AND ENCOURAGEMENT You have a better chance of being successful if you have help. You can get support in many ways.  Tell your family, friends, and coworkers that you are going to quit and need their support. Ask them not to smoke around you.  Get individual, group, or telephone counseling and support. Programs are available at local hospitals and health centers. Call   your local health department for information about programs in your area.  Spiritual beliefs and practices may help some smokers quit.  Download a "quit meter" on your computer to keep track of quit statistics, such as how long you have gone without smoking, cigarettes not smoked, and money saved.  Get a self-help book about quitting smoking and staying off tobacco. LEARN NEW SKILLS AND BEHAVIORS  Distract yourself from urges to smoke. Talk to someone, go for a walk, or occupy your time with a task.  Change your normal routine. Take a different route to work.  Drink tea instead of coffee. Eat breakfast in a different place.  Reduce your stress. Take a hot bath, exercise, or read a book.  Plan something enjoyable to do every day. Reward yourself for not smoking.  Explore interactive web-based programs that specialize in helping you quit. GET MEDICINE AND USE IT CORRECTLY Medicines can help you stop smoking and decrease the urge to smoke. Combining medicine with the above behavioral methods and support can greatly increase your chances of successfully quitting smoking.  Nicotine replacement therapy helps deliver nicotine to your body without the negative effects and risks of smoking. Nicotine replacement therapy includes nicotine gum, lozenges, inhalers, nasal sprays, and skin patches. Some may be available over-the-counter and others require a prescription.  Antidepressant medicine helps people abstain from smoking, but how this works is unknown. This medicine is available by prescription.  Nicotinic receptor partial agonist medicine simulates the effect of nicotine in your brain. This medicine is available by prescription. Ask your health care provider for advice about which medicines to use and how to use them based on your health history. Your health care provider will tell you what side effects to look out for if you choose to be on a medicine or therapy. Carefully read the information on the package. Do not use any other product containing nicotine while using a nicotine replacement product.  RELAPSE OR DIFFICULT SITUATIONS Most relapses occur within the first 3 months after quitting. Do not be discouraged if you start smoking again. Remember, most people try several times before finally quitting. You may have symptoms of withdrawal because your body is used to nicotine. You may crave cigarettes, be irritable, feel very hungry, cough often, get headaches, or have difficulty concentrating. The withdrawal symptoms are only temporary. They are strongest  when you first quit, but they will go away within 10-14 days. To reduce the chances of relapse, try to:  Avoid drinking alcohol. Drinking lowers your chances of successfully quitting.  Reduce the amount of caffeine you consume. Once you quit smoking, the amount of caffeine in your body increases and can give you symptoms, such as a rapid heartbeat, sweating, and anxiety.  Avoid smokers because they can make you want to smoke.  Do not let weight gain distract you. Many smokers will gain weight when they quit, usually less than 10 pounds. Eat a healthy diet and stay active. You can always lose the weight gained after you quit.  Find ways to improve your mood other than smoking. FOR MORE INFORMATION  www.smokefree.gov  Document Released: 02/20/2001 Document Revised: 07/13/2013 Document Reviewed: 06/07/2011 [employer] Patient Information 2015 [employer]. This information is not intended to replace advice given to you by your health care provider. Make sure you discuss any questions you have with your health care provider.  

## 2014-04-26 ENCOUNTER — Emergency Department (HOSPITAL_COMMUNITY)
Admission: EM | Admit: 2014-04-26 | Discharge: 2014-04-26 | Disposition: A | Discharge status: Home / Self Care | Payer: BLUE CROSS/BLUE SHIELD | Attending: Emergency Medicine | Admitting: Emergency Medicine

## 2014-04-26 ENCOUNTER — Encounter (HOSPITAL_COMMUNITY): Payer: Self-pay

## 2014-04-26 ENCOUNTER — Emergency Department (HOSPITAL_COMMUNITY): Payer: BLUE CROSS/BLUE SHIELD

## 2014-04-26 DIAGNOSIS — R079 Chest pain, unspecified: Secondary | ICD-10-CM | POA: Diagnosis present

## 2014-04-26 DIAGNOSIS — J45909 Unspecified asthma, uncomplicated: Secondary | ICD-10-CM | POA: Insufficient documentation

## 2014-04-26 DIAGNOSIS — Z79899 Other long term (current) drug therapy: Secondary | ICD-10-CM | POA: Insufficient documentation

## 2014-04-26 DIAGNOSIS — Z8659 Personal history of other mental and behavioral disorders: Secondary | ICD-10-CM | POA: Insufficient documentation

## 2014-04-26 DIAGNOSIS — Z720 Tobacco use: Secondary | ICD-10-CM | POA: Diagnosis not present

## 2014-04-26 LAB — CBC
HEMATOCRIT: 42.2 % (ref 39.0–52.0)
HEMOGLOBIN: 14.1 g/dL (ref 13.0–17.0)
MCH: 30 pg (ref 26.0–34.0)
MCHC: 33.4 g/dL (ref 30.0–36.0)
MCV: 89.8 fL (ref 78.0–100.0)
PLATELETS: 277 10*3/uL (ref 150–400)
RBC: 4.7 MIL/uL (ref 4.22–5.81)
RDW: 12.7 % (ref 11.5–15.5)
WBC: 9.2 10*3/uL (ref 4.0–10.5)

## 2014-04-26 LAB — BASIC METABOLIC PANEL
Anion gap: 8 (ref 5–15)
BUN: 8 mg/dL (ref 6–23)
CALCIUM: 9.2 mg/dL (ref 8.4–10.5)
CO2: 27 mmol/L (ref 19–32)
CREATININE: 0.85 mg/dL (ref 0.50–1.35)
Chloride: 104 mmol/L (ref 96–112)
GFR calc Af Amer: >90 mL/min (ref >90)
GLUCOSE: 142 mg/dL — AB (ref 70–99)
POTASSIUM: 3.6 mmol/L (ref 3.5–5.1)
SODIUM: 139 mmol/L (ref 135–145)

## 2014-04-26 LAB — I-STAT TROPONIN, ED: Troponin i, poc: 0 ng/mL (ref 0.00–0.08)

## 2014-04-26 MED ORDER — ALBUTEROL SULFATE HFA 108 (90 BASE) MCG/ACT IN AERS
1.0000 | INHALATION_SPRAY | RESPIRATORY_TRACT | Status: DC | PRN
  Administered 2014-04-26: 2 via RESPIRATORY_TRACT
  Filled 2014-04-26: qty 6.7

## 2014-04-26 MED ORDER — PREDNISONE 20 MG PO TABS: 40.0000 mg | ORAL_TABLET | Freq: Every day | ORAL | Status: DC

## 2014-04-26 MED ORDER — PREDNISONE 20 MG PO TABS
60.0000 mg | ORAL_TABLET | Freq: Once | ORAL | Status: AC
  Administered 2014-04-26: 60 mg via ORAL
  Filled 2014-04-26: qty 3

## 2014-04-26 NOTE — ED Notes (Signed)
Pt here for chest pain, sts tight, hx of asthma but states it feels different. Pt reports that he hasnt taken medication because he doesn't like medication.

## 2014-04-26 NOTE — Discharge Instructions (Signed)
Take the prescribed medication as directed. °Return to the ED for new or worsening symptoms. ° °

## 2014-04-26 NOTE — ED Provider Notes (Signed)
CSN: [id number]     Arrival date & time 04/26/14  1425 History   First MD Initiated Contact with Patient 04/26/14 1501     Chief Complaint  Patient presents with  . Chest Pain     (Consider location/radiation/quality/duration/timing/severity/associated sxs/prior Treatment) Patient is a 29 y.o. male presenting with chest pain. The history is provided by the patient and medical records.  Chest Pain   This is a 29 y.o. M with PMH significant for asthma, depression, ADHD, allergies, presenting to the ED for chest pain intermittently for the past week and half.  Patient states his chest feels tight.  He does have history of asthma but states this feels different.  He denies any shortness of breath. He states occasionally he feels a "heat sensation" traveling across his chest.  He states he mostly notices his symptoms at night when his mind is not otherwise distracted.  He states at work when exerting himself or when he is otherwise distracted he has no symptoms.  He states he is concerned that he is dwelling on his symptoms too much.  Patient denies recent travel, LE edema, calf pain.  No prior hx of DVT or PE.  Patient has been using his albuterol neb machine at home which usually provides some relief.  Patient also admits that he has been trying to quit smoking cigarettes and has started inhaling vapor cigarettes instead.  Mild tachycardia noted on arrival.  Past Medical History  Diagnosis Date  . Asthma   . Depressed   . ADHD (attention deficit hyperactivity disorder)   . Allergy    History reviewed. No pertinent past surgical history. Family History  Problem Relation Age of Onset  . COPD Other    History  Substance Use Topics  . Smoking status: Current Every Day Smoker -- 10 years    Types: Cigarettes  . Smokeless tobacco: Not on file  . Alcohol Use: No     Comment: recovering    Review of Systems  Respiratory: Positive for chest tightness.   Cardiovascular: Positive for chest  pain.  All other systems reviewed and are negative.     Allergies  Review of patient's allergies indicates no known allergies.  Home Medications   Prior to Admission medications   Medication Sig Start Date End Date Taking? Authorizing Provider  albuterol (PROVENTIL HFA;VENTOLIN HFA) 108 (90 BASE) MCG/ACT inhaler Inhale 2 puffs into the lungs every 4 (four) hours as needed for wheezing. 04/15/11 04/14/12  Glynn Octave, MD  albuterol (PROVENTIL HFA;VENTOLIN HFA) 108 (90 BASE) MCG/ACT inhaler Inhale 2 puffs into the lungs every 4 (four) hours as needed. For wheezing 02/01/14 02/01/15  Carmelina Dane, MD  albuterol (PROVENTIL) (2.5 MG/3ML) 0.083% nebulizer solution Take 6 mLs (5 mg total) by nebulization every 4 (four) hours as needed for wheezing or shortness of breath. 02/01/14   Carmelina Dane, MD  amoxicillin-clavulanate (AUGMENTIN) 875-125 MG per tablet Take 1 tablet by mouth 2 (two) times daily. 02/01/14   Carmelina Dane, MD  chlorpheniramine-HYDROcodone (TUSSIONEX PENNKINETIC ER) 10-8 MG/5ML LQCR Take 5 mLs by mouth every 12 (twelve) hours as needed. 02/01/14   Carmelina Dane, MD  pseudoephedrine-guaifenesin (MUCINEX D) 60-600 MG per tablet Take 1 tablet by mouth every 12 (twelve) hours. 02/01/14 02/01/15  Carmelina Dane, MD  varenicline (CHANTIX CONTINUING MONTH PAK) 1 MG tablet Take 1 tablet (1 mg total) by mouth 2 (two) times daily. 02/01/14   Carmelina Dane, MD  varenicline (CHANTIX) 0.5 MG  tablet As directed 02/01/14   Carmelina DaneJeffery S Anderson, MD   BP 121/65 mmHg  Pulse 111  Temp(Src) 97.9 F (36.6 C) (Oral)  Resp 20  Ht 5\' 11"  (1.803 m)  Wt 135 lb (61.236 kg)  BMI 18.84 kg/m2  SpO2 97%   Physical Exam  Constitutional: He is oriented to person, place, and time. He appears well-developed and well-nourished.  HENT:  Head: Normocephalic and atraumatic.  Mouth/Throat: Oropharynx is clear and moist.  Eyes: Conjunctivae and EOM are normal. Pupils are equal,  round, and reactive to light.  Neck: Normal range of motion. Neck supple.  Cardiovascular: Normal rate, regular rhythm and normal heart sounds.   Pulmonary/Chest: Effort normal and breath sounds normal. No respiratory distress. He has no wheezes.  Abdominal: Soft. Bowel sounds are normal. There is no tenderness. There is no guarding.  Musculoskeletal: Normal range of motion.  No calf asymmetry, tenderness, or palpable cords Negative Homan's sign bilaterally  Neurological: He is alert and oriented to person, place, and time.  Skin: Skin is warm and dry.  Psychiatric: He has a normal mood and affect.  Nursing note and vitals reviewed.   ED Course  Procedures (including critical care time) Labs Review Labs Reviewed  BASIC METABOLIC PANEL - Abnormal; Notable for the following:    Glucose, Bld 142 (*)    All other components within normal limits  CBC  I-STAT TROPOININ, ED    Imaging Review Dg Chest 2 View  04/26/2014   CLINICAL DATA:  Two days of left-sided chest pain with weakness  EXAM: CHEST  2 VIEW  COMPARISON:  PA and lateral chest x-ray of January 14, 2011  FINDINGS: The lungs are hyperinflated with mild hemidiaphragm flattening. There is no focal infiltrate. There is minimal blunting of the posterior costophrenic angles which is stable. The heart and pulmonary vascularity are normal. The mediastinum is normal in width. The bony thorax is unremarkable.  IMPRESSION: Reactive airway disease. There is no pneumonia nor other acute cardiopulmonary abnormality.   Electronically Signed   By: David  [place]azil[place]   On: 04/26/2014 14:55     EKG Interpretation None        Date: 04/26/2014  Rate: 101  Rhythm: sinus tachycardia  QRS Axis: normal  Intervals: normal  ST/T Wave abnormalities: normal  Conduction Disutrbances:none  Narrative Interpretation:   Old EKG Reviewed: none available    MDM   Final diagnoses:  Chest pain, unspecified chest pain type   29 year old male with vague  complaints of intermittent chest pain over the past week and a half. He states he mostly has symptoms at night when his mind is not otherwise distracted. EKG sinus tachycardia without ischemic change. Labwork is reassuring, troponin negative. Chest x-ray is clear. Patient has no clinical signs of DVT nor any risk factors for DVT or PE.  At this time, low suspicion for ACS, PE, dissection, or other acute cardiac event.  Symptoms possibly multifactorial given his hx of asthma and change in smoking products-- possibly airway irritation.  Feel patient is safe for d/c home with supportive care.  Discussed plan with patient, he/she acknowledged understanding and agreed with plan of care.  Return precautions given for new or worsening symptoms.  Tachycardia resolved prior to discharge without intervention.  Filed Vitals:   04/26/14 1542  BP: 113/58  Pulse: 77  Temp: 98.4 F (36.9 C)  Resp: [address]20    Garlon HatchetLisa M Velda Wendt, PA-C 04/26/14 1740  Enid SkeensJoshua M Zavitz, MD 05/05/14 [phone]

## 2015-03-07 ENCOUNTER — Ambulatory Visit (INDEPENDENT_AMBULATORY_CARE_PROVIDER_SITE_OTHER): Payer: BLUE CROSS/BLUE SHIELD | Admitting: Physician Assistant

## 2015-03-07 VITALS — BP 100/68 | HR 61 | Temp 98.1°F | Resp 18 | Ht 71.0 in | Wt 127.0 lb

## 2015-03-07 DIAGNOSIS — R062 Wheezing: Secondary | ICD-10-CM

## 2015-03-07 DIAGNOSIS — Z8709 Personal history of other diseases of the respiratory system: Secondary | ICD-10-CM

## 2015-03-07 MED ORDER — ALBUTEROL SULFATE HFA 108 (90 BASE) MCG/ACT IN AERS: 2.0000 | INHALATION_SPRAY | Freq: Four times a day (QID) | RESPIRATORY_TRACT | Status: DC | PRN

## 2015-03-07 MED ORDER — BECLOMETHASONE DIPROPIONATE 40 MCG/ACT IN AERS: 1.0000 | INHALATION_SPRAY | Freq: Every morning | RESPIRATORY_TRACT | Status: DC

## 2015-03-07 MED ORDER — ALBUTEROL SULFATE (2.5 MG/3ML) 0.083% IN NEBU: 2.5000 mg | INHALATION_SOLUTION | Freq: Four times a day (QID) | RESPIRATORY_TRACT | Status: DC | PRN

## 2015-03-07 MED ORDER — ALBUTEROL SULFATE (2.5 MG/3ML) 0.083% IN NEBU
2.5000 mg | INHALATION_SOLUTION | Freq: Once | RESPIRATORY_TRACT | Status: AC
  Administered 2015-03-07: 2.5 mg via RESPIRATORY_TRACT

## 2015-03-07 MED ORDER — IPRATROPIUM BROMIDE 0.02 % IN SOLN
0.5000 mg | Freq: Once | RESPIRATORY_TRACT | Status: AC
  Administered 2015-03-07: 0.5 mg via RESPIRATORY_TRACT

## 2015-03-07 NOTE — Progress Notes (Signed)
03/07/2015 11:57 AM   DOB: 10-02-1985 / MRN: [id number]  SUBJECTIVE:  Cough The current episode started in the past 7 days. The problem has been gradually worsening. The problem occurs every few minutes. The cough is non-productive. Associated symptoms include chest pain (tightness), shortness of breath and wheezing. Pertinent negatives include no chills, myalgias, rhinorrhea, sore throat, sweats or weight loss. The symptoms are aggravated by cold air. He has tried a beta-agonist inhaler for the symptoms. The treatment provided moderate relief. His past medical history is significant for asthma. There is no history of emphysema or pneumonia.  Shortness of Breath Associated symptoms include chest pain (tightness) and wheezing. Pertinent negatives include no rhinorrhea or sore throat. His past medical history is significant for asthma. There is no history of pneumonia.   He has a history of asthma and has been without his medication for about two weeks now.  Has used pulmacort daily as a child and did not require rescue medication during this period.  He stopped taking this due to cost.    He has No Known Allergies.   He  has a past medical history of Asthma; Depressed; ADHD (attention deficit hyperactivity disorder); and Allergy.    He  reports that he has been smoking Cigarettes.  He has smoked for the past 10 years. He does not have any smokeless tobacco history on file. He reports that he uses illicit drugs (Marijuana). He reports that he does not drink alcohol. He  reports that he does not currently engage in sexual activity. The patient  has no past surgical history on file.  His family history includes COPD in his other.  Review of Systems  Constitutional: Negative for chills and weight loss.  HENT: Negative for rhinorrhea and sore throat.   Respiratory: Positive for cough, shortness of breath and wheezing.   Cardiovascular: Positive for chest pain (tightness).  Musculoskeletal:  Negative for myalgias.    Problem list and medications reviewed and updated by myself where necessary, and exist elsewhere in the encounter.   OBJECTIVE:  BP 100/68 mmHg  Pulse 61  Temp(Src) 98.1 F (36.7 C) (Oral)  Resp 18  Ht  (1.803 m)  Wt 127 lb (57.607 kg)  BMI 17.72 kg/m2  SpO2 98%  Physical Exam  Constitutional: He is oriented to person, place, and time. He appears well-developed. He does not appear ill.  Eyes: Conjunctivae and EOM are normal. Pupils are equal, round, and reactive to light.  Cardiovascular: Normal rate and regular rhythm.   Pulmonary/Chest: Effort normal. He has wheezes (end inspiratory and expiratory wheezing in all lung fields, resolved with one round of  duoneb).  Abdominal: He exhibits no distension.  Musculoskeletal: Normal range of motion.  Neurological: He is alert and oriented to person, place, and time. No cranial nerve deficit. Coordination normal.  Skin: Skin is warm and dry. He is not diaphoretic.  Psychiatric: He has a normal mood and affect.  Nursing note and vitals reviewed.   No results found for this or any previous visit (from the past 48 hour(s)).  ASSESSMENT AND PLAN:  Alex [name] was seen today for cough and shortness of breath.  Diagnoses and all orders for this visit:  History of asthma -     beclomethasone (QVAR) 40 MCG/ACT inhaler; Inhale 1 puff into the lungs every morning. -     albuterol (PROVENTIL) (2.5 MG/3ML) 0.083% nebulizer solution; Take 3 mLs (2.5 mg total) by nebulization every 6 (six) hours as needed for wheezing  or shortness of breath. -     albuterol (PROVENTIL HFA;VENTOLIN HFA) 108 (90 BASE) MCG/ACT inhaler; Inhale 2 puffs into the lungs every 6 (six) hours as needed for wheezing or shortness of breath.  Wheezing: Patient has needed inhaled corticosteroids in the past and this worked well for him as a child. I will restart this.  He has a neb machine and home and reports this helps him greatly symptomatically.   Advised that he return to clinic if his symptoms do not improve.  -     albuterol (PROVENTIL) (2.5 MG/3ML) 0.083% nebulizer solution 2.5 mg; Take 3 mLs (2.5 mg total) by nebulization once. -     ipratropium (ATROVENT) nebulizer solution 0.5 mg; Take 2.5 mLs (0.5 mg total) by nebulization once.    The patient was advised to call or return to clinic if he does not see an improvement in symptoms or to seek the care of the closest emergency department if he worsens with the above plan.   Deliah BostonMichael Arayna Illescas, MHS, PA-C [facility] 03/07/2015 11:57 AM

## 2015-10-02 ENCOUNTER — Emergency Department (HOSPITAL_COMMUNITY)
Admission: EM | Admit: 2015-10-02 | Discharge: 2015-10-02 | Disposition: A | Discharge status: Home / Self Care | Payer: BLUE CROSS/BLUE SHIELD | Attending: Emergency Medicine | Admitting: Emergency Medicine

## 2015-10-02 DIAGNOSIS — J45901 Unspecified asthma with (acute) exacerbation: Secondary | ICD-10-CM | POA: Insufficient documentation

## 2015-10-02 DIAGNOSIS — Z8709 Personal history of other diseases of the respiratory system: Secondary | ICD-10-CM

## 2015-10-02 DIAGNOSIS — J4521 Mild intermittent asthma with (acute) exacerbation: Secondary | ICD-10-CM

## 2015-10-02 DIAGNOSIS — F17210 Nicotine dependence, cigarettes, uncomplicated: Secondary | ICD-10-CM | POA: Insufficient documentation

## 2015-10-02 MED ORDER — BECLOMETHASONE DIPROPIONATE 40 MCG/ACT IN AERS: 1.0000 | INHALATION_SPRAY | Freq: Every morning | RESPIRATORY_TRACT | 0 refills | Status: DC

## 2015-10-02 MED ORDER — ALBUTEROL SULFATE HFA 108 (90 BASE) MCG/ACT IN AERS: 2.0000 | INHALATION_SPRAY | RESPIRATORY_TRACT | 0 refills | Status: DC | PRN

## 2015-10-02 MED ORDER — PREDNISONE 20 MG PO TABS
40.0000 mg | ORAL_TABLET | Freq: Every day | ORAL | 0 refills | Status: DC
Start: 2015-10-02 — End: 2016-04-29

## 2015-10-02 MED ORDER — ALBUTEROL SULFATE (2.5 MG/3ML) 0.083% IN NEBU: 2.5000 mg | INHALATION_SOLUTION | Freq: Four times a day (QID) | RESPIRATORY_TRACT | 0 refills | Status: DC | PRN

## 2015-10-02 MED ORDER — ALBUTEROL SULFATE (2.5 MG/3ML) 0.083% IN NEBU: 5.0000 mg | INHALATION_SOLUTION | RESPIRATORY_TRACT | 5 refills | Status: DC | PRN

## 2015-10-02 NOTE — ED Notes (Signed)
See downtime documentation 

## 2015-10-07 NOTE — ED Provider Notes (Signed)
[facility]-EMERGENCY DEPT Provider Note   CSN: [id number] Arrival date & time: 10/02/15  4098  First Provider Contact:  First MD Initiated Contact with Patient 10/03/15 [phone]        History   Chief Complaint No chief complaint on file.   HPI Alex [name] is a 30 y.o. male.  HPI   Patient is a 30 year old male, current smoker, with hx asthma, presents to the emergency room in respiratory distress which he states is an asthma exacerbation and he has been out of medications for the past several days.Marland Kitchen He complains of chest tightness and chronic cough, non-productive. He denies fever, chills, sweats, fatigue.  He continues to smoke.  He states he does not know what his triggers are.  No other associated or acute complaints  Level 5 caveat secondary to respiratory distress   Past Medical History:  Diagnosis Date  . ADHD (attention deficit hyperactivity disorder)   . Allergy   . Asthma   . Depressed     Patient Active Problem List   Diagnosis Date Noted  . Tobacco use disorder 02/01/2014  . ADHD (attention deficit hyperactivity disorder)     No past surgical history on file.     Home Medications    Prior to Admission medications   Medication Sig Start Date End Date Taking? Authorizing Provider  albuterol (PROVENTIL HFA;VENTOLIN HFA) 108 (90 Base) MCG/ACT inhaler Inhale 2 puffs into the lungs every 4 (four) hours as needed for wheezing. 10/02/15 10/01/16  Danelle Berry, PA-C  albuterol (PROVENTIL) (2.5 MG/3ML) 0.083% nebulizer solution Take 6 mLs (5 mg total) by nebulization every 4 (four) hours as needed for wheezing or shortness of breath. 10/02/15   Danelle Berry, PA-C  albuterol (PROVENTIL) (2.5 MG/3ML) 0.083% nebulizer solution Take 3 mLs (2.5 mg total) by nebulization every 6 (six) hours as needed for wheezing or shortness of breath. 10/02/15   Danelle Berry, PA-C  beclomethasone (QVAR) 40 MCG/ACT inhaler Inhale 1 puff into the lungs every morning. 10/02/15   Danelle Berry, PA-C    predniSONE (DELTASONE) 20 MG tablet Take 2 tablets (40 mg total) by mouth daily. 10/02/15   Danelle Berry, PA-C  varenicline (CHANTIX CONTINUING MONTH PAK) 1 MG tablet Take 1 tablet (1 mg total) by mouth 2 (two) times daily. Patient not taking: Reported on 04/26/2014 02/01/14   Carmelina Dane, MD  varenicline (CHANTIX) 0.5 MG tablet As directed Patient not taking: Reported on 04/26/2014 02/01/14   Carmelina Dane, MD    Family History Family History  Problem Relation Age of Onset  . COPD Other     Social History Social History  Substance Use Topics  . Smoking status: Current Every Day Smoker    Years: 10.00    Types: Cigarettes  . Smokeless tobacco: Not on file  . Alcohol use No     Comment: recovering     Allergies   Review of patient's allergies indicates no known allergies.   Review of Systems Review of Systems  All other systems reviewed and are negative.    Physical Exam Updated Vital Signs There were no vitals taken for this visit.  Physical Exam  Constitutional: He is oriented to person, place, and time. He appears well-developed and well-nourished. He appears distressed.  HENT:  Head: Normocephalic and atraumatic.  Nose: Nose normal.  Mouth/Throat: Oropharynx is clear and moist.  Eyes: Conjunctivae and EOM are normal. Pupils are equal, round, and reactive to light. Right eye exhibits no discharge. Left eye exhibits  no discharge. No scleral icterus.  Neck: Normal range of motion. No JVD present. No tracheal deviation present.  Cardiovascular: Regular rhythm, normal heart sounds and intact distal pulses.  Tachycardia present.  Exam reveals no gallop and no friction rub.   No murmur heard. Pulmonary/Chest: Accessory muscle usage present. No stridor. Tachypnea noted. He is in respiratory distress. He has decreased breath sounds in the right lower field and the left lower field. He has wheezes. He has no rales. He exhibits retraction. He exhibits no tenderness,  no bony tenderness and no crepitus.  Respiratory distress with retractions, audible wheeze, able to speak 2-3 words at a time.  Diffuse inspiratory and prolonged expiratory wheeze, diminished bilaterally at the bases   Abdominal: Soft. Bowel sounds are normal. He exhibits no distension and no mass. There is no tenderness. There is no rebound and no guarding.  Musculoskeletal: Normal range of motion. He exhibits no edema or tenderness.  Lymphadenopathy:    He has no cervical adenopathy.  Neurological: He is alert and oriented to person, place, and time. He has normal reflexes. He displays normal reflexes. No cranial nerve deficit. He exhibits normal muscle tone. Coordination normal.  Skin: Skin is warm and dry. No rash noted. He is not diaphoretic. No erythema. No pallor.  Psychiatric: He has a normal mood and affect. His behavior is normal. Judgment and thought content normal.  Nursing note and vitals reviewed.    ED Treatments / Results  Labs (all labs ordered are listed, but only abnormal results are displayed) Labs Reviewed - No data to display  EKG  EKG Interpretation None       Radiology No results found.  Procedures Procedures (including critical care time)  Medications Ordered in ED Medications - No data to display   Initial Impression / Assessment and Plan / ED Course  I have reviewed the triage vital signs and the nursing notes.  Pertinent labs & imaging results that were available during my care of the patient were reviewed by me and considered in my medical decision making (see chart for details).  Clinical Course   Pt with asthma exacerbation, Presented in respiratory distress, patient was given steroids and multiple breathing treatments with significant improvement of his dyspnea and wheeze.  Medications were refilled and patient was given a steroid taper.  He was discharged in good condition with stable vital signs.  Final Clinical Impressions(s) / ED  Diagnoses   Final diagnoses:  Acute asthma exacerbation, mild intermittent    New Prescriptions Discharge Medication List as of 10/02/2015  6:00 AM    START taking these medications   Details  predniSONE (DELTASONE) 20 MG tablet Take 2 tablets (40 mg total) by mouth daily., Starting Sun 10/02/2015, Print         Danelle Berry, PA-C 10/08/15 [id number]    Derwood Kaplan, MD 10/11/15 0030

## 2016-04-22 ENCOUNTER — Emergency Department (HOSPITAL_COMMUNITY)
Admission: EM | Admit: 2016-04-22 | Discharge: 2016-04-23 | Disposition: A | Discharge status: Home / Self Care | Payer: BLUE CROSS/BLUE SHIELD | Attending: Emergency Medicine | Admitting: Emergency Medicine

## 2016-04-22 DIAGNOSIS — F17210 Nicotine dependence, cigarettes, uncomplicated: Secondary | ICD-10-CM | POA: Insufficient documentation

## 2016-04-22 DIAGNOSIS — Z79899 Other long term (current) drug therapy: Secondary | ICD-10-CM | POA: Insufficient documentation

## 2016-04-22 DIAGNOSIS — R45851 Suicidal ideations: Secondary | ICD-10-CM

## 2016-04-22 DIAGNOSIS — F909 Attention-deficit hyperactivity disorder, unspecified type: Secondary | ICD-10-CM | POA: Insufficient documentation

## 2016-04-22 DIAGNOSIS — J45909 Unspecified asthma, uncomplicated: Secondary | ICD-10-CM | POA: Insufficient documentation

## 2016-04-22 DIAGNOSIS — F322 Major depressive disorder, single episode, severe without psychotic features: Secondary | ICD-10-CM | POA: Diagnosis present

## 2016-04-22 DIAGNOSIS — F1920 Other psychoactive substance dependence, uncomplicated: Secondary | ICD-10-CM | POA: Diagnosis present

## 2016-04-22 HISTORY — DX: Other specified personal risk factors, not elsewhere classified: Z91.89

## 2016-04-22 HISTORY — DX: Alcohol abuse, uncomplicated: F10.10

## 2016-04-23 ENCOUNTER — Encounter (HOSPITAL_COMMUNITY): Payer: Self-pay | Admitting: *Deleted

## 2016-04-23 ENCOUNTER — Inpatient Hospital Stay (HOSPITAL_COMMUNITY)
Admission: AD | Admit: 2016-04-23 | Discharge: 2016-04-29 | DRG: 885 | Disposition: A | Discharge status: Home / Self Care | Payer: Federal, State, Local not specified - Other | Source: Intra-hospital | Attending: Psychiatry | Admitting: Psychiatry

## 2016-04-23 ENCOUNTER — Encounter (HOSPITAL_COMMUNITY): Payer: Self-pay

## 2016-04-23 DIAGNOSIS — F322 Major depressive disorder, single episode, severe without psychotic features: Secondary | ICD-10-CM | POA: Diagnosis not present

## 2016-04-23 DIAGNOSIS — F1290 Cannabis use, unspecified, uncomplicated: Secondary | ICD-10-CM | POA: Diagnosis present

## 2016-04-23 DIAGNOSIS — F1920 Other psychoactive substance dependence, uncomplicated: Secondary | ICD-10-CM | POA: Diagnosis not present

## 2016-04-23 DIAGNOSIS — F1010 Alcohol abuse, uncomplicated: Secondary | ICD-10-CM | POA: Diagnosis present

## 2016-04-23 DIAGNOSIS — F1110 Opioid abuse, uncomplicated: Secondary | ICD-10-CM | POA: Diagnosis present

## 2016-04-23 DIAGNOSIS — Z79899 Other long term (current) drug therapy: Secondary | ICD-10-CM | POA: Diagnosis not present

## 2016-04-23 DIAGNOSIS — F1420 Cocaine dependence, uncomplicated: Secondary | ICD-10-CM | POA: Diagnosis present

## 2016-04-23 DIAGNOSIS — F1994 Other psychoactive substance use, unspecified with psychoactive substance-induced mood disorder: Secondary | ICD-10-CM

## 2016-04-23 DIAGNOSIS — F17210 Nicotine dependence, cigarettes, uncomplicated: Secondary | ICD-10-CM | POA: Diagnosis present

## 2016-04-23 LAB — COMPREHENSIVE METABOLIC PANEL
ALBUMIN: 4.2 g/dL (ref 3.5–5.0)
ALT: 16 U/L — AB (ref 17–63)
AST: 19 U/L (ref 15–41)
Alkaline Phosphatase: 57 U/L (ref 38–126)
Anion gap: 7 (ref 5–15)
BILIRUBIN TOTAL: 0.4 mg/dL (ref 0.3–1.2)
BUN: 9 mg/dL (ref 6–20)
CHLORIDE: 112 mmol/L — AB (ref 101–111)
CO2: 24 mmol/L (ref 22–32)
CREATININE: 0.76 mg/dL (ref 0.61–1.24)
Calcium: 8.9 mg/dL (ref 8.9–10.3)
GFR calc Af Amer: >60 mL/min (ref >60)
GLUCOSE: 86 mg/dL (ref 65–99)
Potassium: 3.4 mmol/L — ABNORMAL LOW (ref 3.5–5.1)
Sodium: 143 mmol/L (ref 135–145)
Total Protein: 7.2 g/dL (ref 6.5–8.1)

## 2016-04-23 LAB — CBC WITH DIFFERENTIAL/PLATELET
BASOS PCT: 1 %
Basophils Absolute: 0.1 10*3/uL (ref 0.0–0.1)
Eosinophils Absolute: 0.5 10*3/uL (ref 0.0–0.7)
Eosinophils Relative: 5 %
HEMATOCRIT: 43.5 % (ref 39.0–52.0)
Hemoglobin: 14.9 g/dL (ref 13.0–17.0)
LYMPHS PCT: 32 %
Lymphs Abs: 3.2 10*3/uL (ref 0.7–4.0)
MCH: 31.2 pg (ref 26.0–34.0)
MCHC: 34.3 g/dL (ref 30.0–36.0)
MCV: 91 fL (ref 78.0–100.0)
Monocytes Absolute: 1 10*3/uL (ref 0.1–1.0)
Monocytes Relative: 10 %
NEUTROS ABS: 5.3 10*3/uL (ref 1.7–7.7)
NEUTROS PCT: 52 %
PLATELETS: 288 10*3/uL (ref 150–400)
RBC: 4.78 MIL/uL (ref 4.22–5.81)
RDW: 12.9 % (ref 11.5–15.5)
WBC: 10 10*3/uL (ref 4.0–10.5)

## 2016-04-23 LAB — RAPID URINE DRUG SCREEN, HOSP PERFORMED
AMPHETAMINES: NOT DETECTED
BARBITURATES: NOT DETECTED
Benzodiazepines: POSITIVE — AB
COCAINE: POSITIVE — AB
Opiates: NOT DETECTED
TETRAHYDROCANNABINOL: POSITIVE — AB

## 2016-04-23 LAB — SALICYLATE LEVEL: Salicylate Lvl: <7 mg/dL (ref 2.8–30.0)

## 2016-04-23 LAB — ACETAMINOPHEN LEVEL: Acetaminophen (Tylenol), Serum: <10 ug/mL — ABNORMAL LOW (ref 10–30)

## 2016-04-23 LAB — ETHANOL: Alcohol, Ethyl (B): 139 mg/dL — ABNORMAL HIGH (ref <5)

## 2016-04-23 MED ORDER — VITAMIN B-1 100 MG PO TABS
100.0000 mg | ORAL_TABLET | Freq: Every day | ORAL | Status: DC
  Administered 2016-04-23: 100 mg via ORAL
  Filled 2016-04-23: qty 1

## 2016-04-23 MED ORDER — NICOTINE 21 MG/24HR TD PT24
21.0000 mg | MEDICATED_PATCH | Freq: Every day | TRANSDERMAL | Status: DC
  Filled 2016-04-23 (×2): qty 1

## 2016-04-23 MED ORDER — NICOTINE 21 MG/24HR TD PT24
21.0000 mg | MEDICATED_PATCH | Freq: Every day | TRANSDERMAL | Status: DC
  Administered 2016-04-23: 21 mg via TRANSDERMAL
  Filled 2016-04-23: qty 1

## 2016-04-23 MED ORDER — ALUM & MAG HYDROXIDE-SIMETH 200-200-20 MG/5ML PO SUSP: 30.0000 mL | ORAL | Status: DC | PRN

## 2016-04-23 MED ORDER — NICOTINE POLACRILEX 2 MG MT GUM
2.0000 mg | CHEWING_GUM | OROMUCOSAL | Status: AC | PRN
  Administered 2016-04-23 – 2016-04-26 (×15): 2 mg via ORAL

## 2016-04-23 MED ORDER — VITAMIN B-1 100 MG PO TABS
100.0000 mg | ORAL_TABLET | Freq: Every day | ORAL | Status: DC
  Administered 2016-04-24 – 2016-04-29 (×6): 100 mg via ORAL
  Filled 2016-04-23 (×9): qty 1

## 2016-04-23 MED ORDER — LORAZEPAM 1 MG PO TABS
0.0000 mg | ORAL_TABLET | Freq: Four times a day (QID) | ORAL | Status: DC
  Administered 2016-04-23 (×2): 1 mg via ORAL
  Filled 2016-04-23 (×2): qty 1

## 2016-04-23 MED ORDER — ONDANSETRON HCL 4 MG PO TABS: 4.0000 mg | ORAL_TABLET | Freq: Three times a day (TID) | ORAL | Status: DC | PRN

## 2016-04-23 MED ORDER — LORAZEPAM 1 MG PO TABS: 0.0000 mg | ORAL_TABLET | Freq: Two times a day (BID) | ORAL | Status: DC

## 2016-04-23 MED ORDER — IBUPROFEN 200 MG PO TABS: 600.0000 mg | ORAL_TABLET | Freq: Three times a day (TID) | ORAL | Status: DC | PRN

## 2016-04-23 MED ORDER — LORAZEPAM 1 MG PO TABS
0.0000 mg | ORAL_TABLET | Freq: Four times a day (QID) | ORAL | Status: DC
  Administered 2016-04-23 – 2016-04-24 (×3): 1 mg via ORAL
  Filled 2016-04-23 (×3): qty 1

## 2016-04-23 MED ORDER — ACETAMINOPHEN 325 MG PO TABS
650.0000 mg | ORAL_TABLET | ORAL | Status: DC | PRN
Start: 2016-04-23 — End: 2016-04-29
  Administered 2016-04-27 – 2016-04-28 (×2): 650 mg via ORAL
  Filled 2016-04-23 (×2): qty 2

## 2016-04-23 MED ORDER — ALBUTEROL SULFATE HFA 108 (90 BASE) MCG/ACT IN AERS
2.0000 | INHALATION_SPRAY | Freq: Four times a day (QID) | RESPIRATORY_TRACT | Status: DC | PRN
  Filled 2016-04-23: qty 6.7

## 2016-04-23 MED ORDER — MAGNESIUM HYDROXIDE 400 MG/5ML PO SUSP: 30.0000 mL | Freq: Every day | ORAL | Status: DC | PRN

## 2016-04-23 MED ORDER — IBUPROFEN 600 MG PO TABS
600.0000 mg | ORAL_TABLET | Freq: Three times a day (TID) | ORAL | Status: DC | PRN
  Administered 2016-04-27 – 2016-04-28 (×3): 600 mg via ORAL
  Filled 2016-04-23 (×3): qty 1

## 2016-04-23 MED ORDER — ACETAMINOPHEN 325 MG PO TABS: 650.0000 mg | ORAL_TABLET | ORAL | Status: DC | PRN

## 2016-04-23 MED ORDER — ZOLPIDEM TARTRATE 5 MG PO TABS: 5.0000 mg | ORAL_TABLET | Freq: Every evening | ORAL | Status: DC | PRN

## 2016-04-23 MED ORDER — ALBUTEROL SULFATE HFA 108 (90 BASE) MCG/ACT IN AERS
2.0000 | INHALATION_SPRAY | Freq: Four times a day (QID) | RESPIRATORY_TRACT | Status: DC | PRN
  Administered 2016-04-24 – 2016-04-28 (×7): 2 via RESPIRATORY_TRACT
  Filled 2016-04-23: qty 6.7

## 2016-04-23 MED ORDER — THIAMINE HCL 100 MG/ML IJ SOLN: 100.0000 mg | Freq: Every day | INTRAMUSCULAR | Status: DC

## 2016-04-23 NOTE — ED Notes (Signed)
Pt remains on monitor. 

## 2016-04-23 NOTE — Progress Notes (Signed)
Patient denied SI and HI, contracts for safety.  Denied A/V hallucinations.  Patient has been talking to peers in dayroom and talking on phone.  Patient stated he would like to go to rehab after discharge.

## 2016-04-23 NOTE — ED Provider Notes (Addendum)
[facility]-EMERGENCY DEPT Provider Note   CSN: [id number] Arrival date & time: 04/22/16  2354  By signing my name below, I, Alex [name], attest that this documentation has been prepared under the direction and in the presence of Alex Creasehristopher J Pollina, MD. Electronically Signed: Elder Negusussell [name], Scribe. 04/23/16. 12:37 AM.   History   Chief Complaint Chief Complaint  Patient presents with  . Suicidal    HPI Alex [name] is a 31 y.o. male who presents to the ED for evaluation of suicidality. According to police report, this patient has been sending text messages throughout the week to his friend alluding to him "ending it all". Tonight he sent several text messages of similar nature his friend who then called police for assistance. Police arrive at the patient's place of residence to see him holding a gun. The patient then exits his apartment with his gun in his hand and "throws the gun to the ground".   At interview, the patient states "yea I might have said I am not happy with my life, but I wasn't going to shoot myself. I would drink myself to death before shooting myself. I drink every night". Otherwise is combative and a complete history is limited.   The history is provided by the patient. No language interpreter was used.    Past Medical History:  Diagnosis Date  . ADHD (attention deficit hyperactivity disorder)   . Allergy   . Asthma   . Depressed   . ETOH abuse   . History of suicidal tendencies     Patient Active Problem List   Diagnosis Date Noted  . Tobacco use disorder 02/01/2014  . ADHD (attention deficit hyperactivity disorder)     History reviewed. No pertinent surgical history.     Home Medications    Prior to Admission medications   Medication Sig Start Date End Date Taking? Authorizing Provider  albuterol (PROVENTIL HFA;VENTOLIN HFA) 108 (90 Base) MCG/ACT inhaler Inhale 2 puffs into the lungs every 4 (four) hours as needed for wheezing. 10/02/15  10/01/16  Danelle BerryLeisa Tapia, PA-C  albuterol (PROVENTIL) (2.5 MG/3ML) 0.083% nebulizer solution Take 6 mLs (5 mg total) by nebulization every 4 (four) hours as needed for wheezing or shortness of breath. 10/02/15   Danelle BerryLeisa Tapia, PA-C  albuterol (PROVENTIL) (2.5 MG/3ML) 0.083% nebulizer solution Take 3 mLs (2.5 mg total) by nebulization every 6 (six) hours as needed for wheezing or shortness of breath. 10/02/15   Danelle BerryLeisa Tapia, PA-C  beclomethasone (QVAR) 40 MCG/ACT inhaler Inhale 1 puff into the lungs every morning. 10/02/15   Danelle BerryLeisa Tapia, PA-C  predniSONE (DELTASONE) 20 MG tablet Take 2 tablets (40 mg total) by mouth daily. 10/02/15   Danelle BerryLeisa Tapia, PA-C  varenicline (CHANTIX CONTINUING MONTH PAK) 1 MG tablet Take 1 tablet (1 mg total) by mouth 2 (two) times daily. Patient not taking: Reported on 04/26/2014 02/01/14   Carmelina DaneJeffery S Anderson, MD  varenicline (CHANTIX) 0.5 MG tablet As directed Patient not taking: Reported on 04/26/2014 02/01/14   Carmelina DaneJeffery S Anderson, MD    Family History Family History  Problem Relation Age of Onset  . COPD Other     Social History Social History  Substance Use Topics  . Smoking status: Current Every Day Smoker    Packs/day: 1.00    Years: 10.00    Types: Cigarettes  . Smokeless tobacco: Never Used  . Alcohol use Yes     Comment: 5th of vodka daily     Allergies   Patient has no  known allergies.   Review of Systems Review of Systems  Psychiatric/Behavioral: Positive for suicidal ideas.  All other systems reviewed and are negative.    Physical Exam Updated Vital Signs BP 124/79 (BP Location: Left Arm)   Pulse 95   Temp 98.6 F (37 C) (Oral)   Resp 16   Ht 6' (1.829 m)   Wt 139 lb (63 kg)   SpO2 97%   BMI 18.85 kg/m   Physical Exam  Constitutional: He is oriented to person, place, and time. He appears well-developed and well-nourished. No distress.  HENT:  Head: Normocephalic and atraumatic.  Right Ear: Hearing normal.  Left Ear: Hearing normal.    Nose: Nose normal.  Mouth/Throat: Oropharynx is clear and moist and mucous membranes are normal.  Eyes: Conjunctivae and EOM are normal. Pupils are equal, round, and reactive to light.  Neck: Normal range of motion. Neck supple.  Cardiovascular: Regular rhythm, S1 normal and S2 normal.  Exam reveals no gallop and no friction rub.   No murmur heard. Pulmonary/Chest: Effort normal and breath sounds normal. No respiratory distress. He exhibits no tenderness.  Abdominal: Soft. Normal appearance and bowel sounds are normal. There is no hepatosplenomegaly. There is no tenderness. There is no rebound, no guarding, no tenderness at McBurney's point and negative Murphy's sign. No hernia.  Musculoskeletal: Normal range of motion.  Neurological: He is alert and oriented to person, place, and time. He has normal strength. No cranial nerve deficit or sensory deficit. Coordination normal. GCS eye subscore is 4. GCS verbal subscore is 5. GCS motor subscore is 6.  Skin: Skin is warm, dry and intact. No rash noted. No cyanosis.  Psychiatric: His speech is normal. Thought content normal. His affect is angry. He is agitated. He exhibits a depressed mood.  Nursing note and vitals reviewed.    ED Treatments / Results  Labs (all labs ordered are listed, but only abnormal results are displayed) Labs Reviewed  CBC WITH DIFFERENTIAL/PLATELET  COMPREHENSIVE METABOLIC PANEL  ETHANOL  SALICYLATE LEVEL  ACETAMINOPHEN LEVEL  RAPID URINE DRUG SCREEN, HOSP PERFORMED    EKG  EKG Interpretation  Date/Time:  Monday April 23 2016 00:42:47 EST Ventricular Rate:  95 PR Interval:    QRS Duration: 80 QT Interval:  334 QTC Calculation: 420 R Axis:   88 Text Interpretation:  Sinus rhythm ST elev, probable normal early repol pattern Confirmed by POLLINA  MD, CHRISTOPHER ([id number]) on 04/23/2016 12:46:27 AM       Radiology No results found.  Procedures Procedures (including critical care time)  Medications  Ordered in ED Medications - No data to display   Initial Impression / Assessment and Plan / ED Course  I have reviewed the triage vital signs and the nursing notes.  Pertinent labs & imaging results that were available during my care of the patient were reviewed by me and considered in my medical decision making (see chart for details).     Patient brought to the emergency department in the custody of police. He reportedly detected a friend numerous times tonight that he was depressed and wanted to kill himself. He text at multiple Goodbyes to his friend and stated that he was going to use his Glock. The friend called the police who found him sitting on his couch with his gun. When they knocked out the door, he racked the slide of the gun and answered the door with the gun. When he noticed it was the police, he did drop it. Patient  now extremely agitated. He does report that he has been depressed in his life is bad. He says now that he would not shoot himself. He says he drinks every day and he was going to kill himself he would drink himself to death.  Patient will require psychiatric evaluation. Based on his actions earlier tonight, IVC paperwork initiated.  Final Clinical Impressions(s) / ED Diagnoses   Final diagnoses:  Suicidal ideation  Depression, unspecified depression type    New Prescriptions New Prescriptions   No medications on file  I personally performed the services described in this documentation, which was scribed in my presence. The recorded information has been reviewed and is accurate.    Alex Crease, MD 04/23/16 1610    Alex Crease, MD 04/23/16 [phone]

## 2016-04-23 NOTE — ED Triage Notes (Signed)
Pt brought in by [employer] d/t pt texting a note to friend, Madelin RearDillon.  Pt stated "I pulled a gun on the cops but when I knew it was cops I threw it out on the porch.  I smoked some meth, tood 13 valium and drank a pint of vodka.  I just texted Madelin RearDillon saying see ya later, if you're lucky.  I've been in a psychiatric hospital before."

## 2016-04-23 NOTE — ED Notes (Signed)
TTS consult in progress. °

## 2016-04-23 NOTE — Tx Team (Addendum)
Initial Treatment Plan 04/23/2016 2:49 PM Alex [name] M [place] [id number]    PATIENT STRESSORS: Substance abuse   PATIENT STRENGTHS: Ability for insight Active sense of humor Average or above average intelligence Capable of independent living Communication skills General fund of knowledge Work skills   PATIENT IDENTIFIED PROBLEMS: Loss of a relationship  Alcohol use  Substance abuse  "get clean"  "go home"             DISCHARGE CRITERIA:  Ability to meet basic life and health needs Improved stabilization in mood, thinking, and/or behavior  PRELIMINARY DISCHARGE PLAN: Outpatient therapy Return to previous work or school arrangements   PATIENT/FAMILY INVOLVEMENT: This treatment plan has been presented to and reviewed with the patient rThe patient and family have been given the opportunity to ask questions and make suggestions.  Dara HoyerAshley N Strader, RN 04/23/2016, 2:49 PM

## 2016-04-23 NOTE — BHH Counselor (Signed)
Spoke with Jari Favrescar, RN advised the pt's BAL must be determined prior to conducting the assessment. Pt admits to drinking a pink of vodka this evening. Pt's BAL must be below 150 in order to complete the assessment. Labs are currently pending and will be reviewed upon completion.  Princess BruinsAquicha Duff, MSW, Theresia MajorsLCSWA

## 2016-04-23 NOTE — BHH Counselor (Signed)
Pt's BAL 139 (h) at 01:30 on 04/23/16. Pt to be assessed by TTS   Alex [name], MSW, LCSWA

## 2016-04-23 NOTE — ED Notes (Signed)
Report called to behavioral health, Charity fundraiserBeverly RN.  [employer] to transport patient.

## 2016-04-23 NOTE — Progress Notes (Addendum)
Pt admitted to room 305/2. Pt compliant with admission process. Skin assessment performed. Pt denies SI/HI/AVH at this time. Pt reports increase in anxiety and depression. Pt reports 5 [facility] once [facility]. Pt reports he was sober from age 29 .Pt reports he had a bad breakup with girlfriend one year ago and has been drinking since this event. Pt reports he is a daily drinker. Pt laughing inappropriately at times during assessment. Pt reports he lives with his sister. Encouragement and support provided. Special checks q 15 mins in place for safety. Report given to nurse [facility],RN.

## 2016-04-23 NOTE — Progress Notes (Signed)
Nursing Progress Note 7p-7a  D) Patient presents irritable and is guarded with Clinical research associatewriter. Patient is isolative to his bed and sleeping during beginning of shift. Patient awakened for medication. Patient is A&O x4 when awakened. Patient states he has no issues or concerns for Clinical research associatewriter. Patient states angrily, "I am here so I'm not okay". Patient reports withdrawal symptoms. Patient denies SI/HI/AVH or pain. Patient contracts for safety at this time. CIWA score is 5.  A) Emotional support given. Patient medicated with PM orders as prescribed. Medications reviewed with patient. Patient on q15 min safety checks. Opportunities for questions or concerns presented to patient. Patient encouraged to continue to work on treatment goals.  R) Patient receptive to interaction with nurse. Patient remains safe on the unit at this time. Patient is resting in bed without complaints. Will continue to monitor.

## 2016-04-23 NOTE — BH Assessment (Signed)
[facility] Assessment Progress Note  Per Thedore MinsMojeed Akintayo, MD, this pt requires psychiatric hospitalization.  Malva LimesLinsey Strader, RN, [facility].  Pt presents under IVC initiated by EDP Ronna Poliohristopher Polina, MD, and [facility].  Pt's nurse has been notified, and agrees to call report to [phone].  Pt is to be transported via [employer] examinerlaw enforcement.   Doylene Canninghomas Dannisha Eckmann, MA Triage Specialist [phone]

## 2016-04-23 NOTE — B[facility] Group Notes (Signed)
[facility] LCSW Group Therapy  04/23/2016 1:30 to 2:30 PM  Type of Therapy:  Group Therapy Overcoming Obstacles   Participation Level:  Did Not Attend; invited to participate yet did not despite overhead announcement and encouragement by staff     Catherine C Harrill, LCSW 

## 2016-04-23 NOTE — Plan of Care (Signed)
Problem: Activity: Goal: Interest or engagement in activities will improve Outcome: Not Progressing Patient is isolative to room and minimal with staff members. Patient did not attend group or get up for snacks.

## 2016-04-23 NOTE — BHH Counselor (Signed)
TTS consult ordered for the pt at 00:39 on 04/23/16. Per chart, pt reports he "smoked some meth, tood 13 valium and drank a pint of vodka". Pt's labs are not back at this time. Unable to identify the pt's current level of intoxication. TTS assessment will take place once the pt's labs have been reviewed to determine the pt is not intoxicated and is fully able to participate in the assessment.  Princess BruinsAquicha Duff, MSW, Theresia MajorsLCSWA

## 2016-04-23 NOTE — Progress Notes (Signed)
Psychoeducational Group Note  Date:  04/23/2016 Time:  2328  Group Topic/Focus:  Wrap-Up Group:   The focus of this group is to help patients review their daily goal of treatment and discuss progress on daily workbooks.  Participation Level: Did Not Attend  Participation Quality:  Not Applicable  Affect:  Not Applicable  Cognitive:  Not Applicable  Insight:  Not Applicable  Engagement in Group: Not Applicable  Additional Comments:  The patient did not attend group since he was asleep in his bed.   Hazle CocaGOODMAN, Christie Viscomi S 04/23/2016, 11:28 PM

## 2016-04-23 NOTE — BH Assessment (Signed)
Tele Assessment Note   Alex [name] is an 31 y.o. male who presents to the ED under IVC. According to the chart, the police report stated that the pt sent a text message to his friend saying that he wanted to "end it all." Pt reports he sent a message to his friend and his friend "misunderstood the message" and called the police because he thought he was suicidal. Pt denies that he is suicidal. According to the chart, pt "pulled a gun on police." Pt reports he was holding the door and when he saw that the police were at the door he dropped the gun. Pt continued to laugh at inappropriate times during the assessment stating "I am quite intelligent if I wanted to kill myself I would be dead already. I have tons of guns at home."    Pt states he was sober for 4 years from age 25 to age 55 however after he cheated on his girlfriend he began using drugs and consuming alcohol heavily after being sober for 4 years. Pt reports the incident today was triggered by a conflict he had with his girlfriend. Pt continued asking when he was going to be allowed to leave and stated he was not suicidal. Pt denies HI and denies AVH. Pt laughed and stated "i'm not crazy." Pt stated "I was just upset. I just had a bad night." Pt reports he uses drugs because "I want to be outside of myself because I usually hate people but when I do drugs I'm able to be social and tolerate people." Pt stated "i'm a very happy person I just get depressed and lash out."    During the assessment the pt presented to be agitated. Pt was handcuffed to the bed and continued asking when he will be able to go home. Pt was alert and partially oriented during the assessment. Pt presented to be minimizing the severity and extent of the incident often laughing and making sarcastic remarks throughout the assessment.   Per Nira Conn, NP and Gilda Crease, MD pt meets criteria for inpt treatment. TTS to seek placement.    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 No pertinent surgical history.  Family History:  Family History  Problem Relation Age of Onset   COPD Other     Social History:  reports that he has been smoking Cigarettes.  He has a 10.00 pack-year smoking history. He has never used smokeless tobacco. He reports that he drinks alcohol. He reports that he uses drugs, including Marijuana and Cocaine, about 6 times per week.  Additional Social History:  Alcohol / Drug Use Pain Medications: See PTA meds Prescriptions: See PTA meds Over the Counter: See PTA meds History of alcohol / drug use?: Yes Longest period of sobriety (when/how long): 4 years from age [age] Substance #1 Name of Substance 1: Alcohol 1 - Age of First Use: 31 y/o 1 - Amount (size/oz): pt stated "as much as I can drink" 1 - Frequency: daily 1 - Duration: ongoing 1 - Last Use / Amount: today Substance #2 Name of Substance 2: Cocaine 2 - Age of First  Use: 31 y/o 2 - Amount (size/oz): 2 grams 2 - Frequency: every other night  2 - Duration: ongoing 2 - Last Use / Amount: today Substance #3 Name of Substance 3: Opioid 3 - Age of First Use: unknown 3 - Amount (size/oz): pt reports "took 15 valium" 3 - Frequency: pt stated "not often" 3 - Duration: ongoing 3 - Last Use / Amount: today Substance #4 Name of Substance 4: Marijuana 4 - Age of First Use: unknown 4 - Amount (size/oz): unknown 4 - Frequency: daily 4 - Duration: ongoing 4 - Last Use / Amount: today   CIWA: CIWA-Ar BP: 124/79 Pulse Rate: 95 Nausea and Vomiting: no nausea and no vomiting Tactile Disturbances: none Tremor: no tremor Auditory Disturbances: not  present Paroxysmal Sweats: no sweat visible Visual Disturbances: not present Anxiety: moderately anxious, or guarded, so anxiety is inferred Headache, Fullness in Head: none present Agitation: moderately fidgety and restless Orientation and Clouding of Sensorium: oriented and can do serial additions CIWA-Ar Total: 8 COWS:    PATIENT STRENGTHS: (choose at least two) Capable of independent living Financial means  Allergies: No Known Allergies  Home Medications:  (Not in a hospital admission)  OB/GYN Status:  No LMP for male patient.  General Assessment Data Location of Assessment: [facility] ED TTS Assessment: In system Is this a Tele or Face-to-Face Assessment?: Face-to-Face Is this an Initial Assessment or a Re-assessment for this encounter?: Initial Assessment Marital status: Single Is patient pregnant?: No Pregnancy Status: No Living Arrangements: Other relatives Can pt return to current living arrangement?: Yes Admission Status: Involuntary Is patient capable of signing voluntary admission?: No Referral Source: Self/Family/Friend Insurance type: BCBS     Crisis Care Plan Living Arrangements: Other relatives Name of Psychiatrist: none Name of Therapist: none  Education Status Is patient currently in school?: No Highest grade of school patient has completed: 11th  Risk to self with the past 6 months Suicidal Ideation: Yes-Currently Present Has patient been a risk to self within the past 6 months prior to admission? : Yes Suicidal Intent: No Has patient had any suicidal intent within the past 6 months prior to admission? : No Is patient at risk for suicide?: Yes (per reports from chart, pt was holding gun when [employer] arrived ) Suicidal Plan?: Yes-Currently Present Has patient had any suicidal plan within the past 6 months prior to admission? : Yes Specify Current Suicidal Plan: pt denies however per reports pt sent a text message to his friend and was holding a gun when  police arrived to the home  Access to Means: Yes Specify Access to Suicidal Means: pt reports he has multiple guns in the home  What has been your use of drugs/alcohol within the last 12 months?: reports to daily alcohol use, cocaine usage and pt tested positive for cannabis  Previous Attempts/Gestures: No Triggers for Past Attempts: None known Intentional Self Injurious Behavior: None Family Suicide History: No Recent stressful life event(s): Conflict (Comment) (pt reports he argued with his girlfriend ) Persecutory voices/beliefs?: No Depression: No Depression Symptoms: Feeling angry/irritable Substance abuse history and/or treatment for substance abuse?: Yes Suicide prevention information given to non-admitted patients: Not applicable  Risk to Others within the past 6 months Homicidal Ideation: No Does patient have any lifetime risk of violence toward others beyond the six months prior to admission? : No Thoughts of Harm to Others: No Current Homicidal Intent: No Current Homicidal Plan: No Access to Homicidal Means: No History of harm to others?: No Assessment  of Violence: None Noted Does patient have access to weapons?: Yes (Comment) (guns) Criminal Charges Pending?: No (denies) Does patient have a court date: No Is patient on probation?: No  Psychosis Hallucinations: None noted Delusions: None noted  Mental Status Report Appearance/Hygiene: Disheveled Eye Contact: Good Motor Activity: Agitation Speech: Aggressive, Argumentative Level of Consciousness: Alert Mood: Angry, Irritable Affect: Anxious, Angry Anxiety Level: Minimal Thought Processes: Relevant, Coherent Judgement: Impaired Orientation: Person, Time, Appropriate for developmental age Obsessive Compulsive Thoughts/Behaviors: None  Cognitive Functioning Concentration: Normal Memory: Recent Intact, Remote Intact IQ: Average Insight: Poor Impulse Control: Poor Appetite: Good Sleep: No Change Total Hours  of Sleep: 8 Vegetative Symptoms: None  ADLScreening [facility] Assessment Services) Patient's cognitive ability adequate to safely complete daily activities?: Yes Patient able to express need for assistance with ADLs?: Yes Independently performs ADLs?: Yes (appropriate for developmental age)  Prior Inpatient Therapy Prior Inpatient Therapy: Yes Prior Therapy Dates: 2005 Prior Therapy Facilty/Provider(s): [facility] Reason for Treatment: SA  Prior Outpatient Therapy Prior Outpatient Therapy: Yes Prior Therapy Dates: 2013 Prior Therapy Facilty/Provider(s): [facility] Reason for Treatment: SA Does patient have an ACCT team?: No Does patient have Intensive In-House Services?  : No Does patient have [facility] services? : No Does patient have P4CC services?: No  ADL Screening (condition at time of admission) Patient's cognitive ability adequate to safely complete daily activities?: Yes Is the patient deaf or have difficulty hearing?: No Does the patient have difficulty seeing, even when wearing glasses/contacts?: No Does the patient have difficulty concentrating, remembering, or making decisions?: No Patient able to express need for assistance with ADLs?: Yes Does the patient have difficulty dressing or bathing?: No Independently performs ADLs?: Yes (appropriate for developmental age) Does the patient have difficulty walking or climbing stairs?: No Weakness of Legs: None Weakness of Arms/Hands: None  Home Assistive Devices/Equipment Home Assistive Devices/Equipment: None    Abuse/Neglect Assessment (Assessment to be complete while patient is alone) Physical Abuse: Denies Verbal Abuse: Denies Sexual Abuse: Denies Exploitation of patient/patient's resources: Denies Self-Neglect: Denies     Merchant navy officer (For Healthcare) Does Patient Have a Medical Advance Directive?: No Would patient like information on creating a medical advance directive?: No - Patient declined    Additional  Information 1:1 In Past 12 Months?: No CIRT Risk: No Elopement Risk: No Does patient have medical clearance?: Yes     Disposition:  Disposition Initial Assessment Completed for this Encounter: Yes Disposition of Patient: Inpatient treatment program Type of inpatient treatment program: Adult (per Nira Conn, NP)  Karolee Ohs 04/23/2016 2:19 AM

## 2016-04-23 NOTE — ED Notes (Signed)
Pt stated "[facility] because my parents put me there for drinking & doing drugs.  I was taking Celexa but it's been 6-7 yrs ago.  I was clean until I was 29 and then I started drinking because of a girl.  I work in [employer].  I do cocaine every day I work because I drink at night and it's the only way I can get up.  I really wasn't going to kill myself or the cops.  But when someone comes to your door.  When they came and I saw it was them I threw it on the porch.  My friend Madelin RearDillon just misunderstood the text."

## 2016-04-24 DIAGNOSIS — F322 Major depressive disorder, single episode, severe without psychotic features: Principal | ICD-10-CM

## 2016-04-24 DIAGNOSIS — F1920 Other psychoactive substance dependence, uncomplicated: Secondary | ICD-10-CM

## 2016-04-24 DIAGNOSIS — Z79899 Other long term (current) drug therapy: Secondary | ICD-10-CM

## 2016-04-24 DIAGNOSIS — F1994 Other psychoactive substance use, unspecified with psychoactive substance-induced mood disorder: Secondary | ICD-10-CM

## 2016-04-24 MED ORDER — TRAZODONE HCL 50 MG PO TABS
50.0000 mg | ORAL_TABLET | Freq: Every evening | ORAL | Status: DC | PRN
  Administered 2016-04-24 – 2016-04-28 (×7): 50 mg via ORAL
  Filled 2016-04-24 (×15): qty 1

## 2016-04-24 MED ORDER — NALTREXONE HCL 50 MG PO TABS
50.0000 mg | ORAL_TABLET | Freq: Every day | ORAL | Status: DC
  Administered 2016-04-25 – 2016-04-29 (×5): 50 mg via ORAL
  Filled 2016-04-24 (×7): qty 1

## 2016-04-24 MED ORDER — CHLORDIAZEPOXIDE HCL 25 MG PO CAPS
25.0000 mg | ORAL_CAPSULE | Freq: Four times a day (QID) | ORAL | Status: AC | PRN
  Administered 2016-04-25 – 2016-04-26 (×2): 25 mg via ORAL
  Filled 2016-04-24 (×2): qty 1

## 2016-04-24 NOTE — Progress Notes (Signed)
D:  Patient's self inventory sheet, patient has fair sleep, no sleep medication given.  Fair appetite, low energy level, good concentration.  Rated depression 5, denied hopeless, anxiety #10.  Withdrawals, chilling, cravings.  Denied SI.  Physical problems "I wanna drink."  Physical pain, worst pain #4 in past 24 hours, back, no pain medication.  Goal is "to not drink".  Plans to "not drink".    No discharge plans. A:  Medications administered per MD orders.  Emotional support and encouragement given patient. R:  Patient denied SI and HI, contracts for safety.  Denied  A/V hallucinations.  Safety maintained with 15 minute checks.

## 2016-04-24 NOTE — Tx Team (Signed)
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 acetaminophen (TYLENOL) tablet 650 mg  650 mg Oral Q4H PRN Patrecia Pour, NP      . albuterol (PROVENTIL HFA;VENTOLIN HFA) 108 (90 Base) MCG/ACT inhaler 2 puff  2 puff Inhalation Q6H PRN Patrecia Pour, NP   2 puff at 04/24/16 [phone]  . alum & mag hydroxide-simeth (MAALOX/MYLANTA) 200-200-20 MG/5ML suspension 30 mL  30 mL Oral Q4H PRN Patrecia Pour, NP      . chlordiazePOXIDE (LIBRIUM) capsule 25 mg  25 mg Oral Q6H PRN Artist Beach, MD      . ibuprofen (ADVIL,MOTRIN) tablet 600 mg  600 mg Oral Q8H PRN Patrecia Pour, NP      . magnesium hydroxide (MILK OF MAGNESIA) suspension 30 mL  30 mL Oral Daily PRN Patrecia Pour, NP      . Derrill Memo ON 04/25/2016] naltrexone (DEPADE) tablet 50 mg  50 mg Oral Daily Artist Beach, MD      . nicotine polacrilex (NICORETTE) gum 2 mg  2 mg Oral PRN Jenne Campus, MD   2 mg at 04/24/16 1108  . ondansetron (ZOFRAN) tablet 4 mg  4 mg Oral Q8H PRN Patrecia Pour, NP      . thiamine (VITAMIN B-1) tablet 100 mg  100 mg Oral Daily Patrecia Pour, NP   100 mg at 04/24/16 0825   PTA Medications: Prescriptions Prior to Admission  Medication Sig Dispense Refill Last Dose  . albuterol (PROVENTIL HFA;VENTOLIN HFA) 108 (90 Base) MCG/ACT inhaler Inhale 2 puffs into the lungs every 4 (four) hours as needed for wheezing. 1 Inhaler 0 Past Month at Unknown time  . albuterol (PROVENTIL) (2.5 MG/3ML) 0.083% nebulizer solution Take 3 mLs (2.5 mg total) by nebulization every 6 (six)  hours as needed for wheezing or shortness of breath. (Patient not taking: Reported on 04/23/2016) 150 mL 0 Not Taking at Unknown time  . beclomethasone (QVAR) 40 MCG/ACT inhaler Inhale 1 puff into the lungs every morning. (Patient not taking: Reported on 04/23/2016) 1 Inhaler 0 Not Taking at Unknown time  . predniSONE (DELTASONE) 20 MG tablet Take 2 tablets (40 mg total) by mouth daily. (Patient not taking: Reported on 04/23/2016) 10 tablet 0 Not Taking at Unknown time  . varenicline (CHANTIX CONTINUING MONTH PAK) 1 MG tablet Take 1 tablet (1 mg total) by mouth 2 (two) times daily. (Patient not taking: Reported on 04/26/2014) 60 tablet 2 Not Taking at Unknown time  . varenicline (CHANTIX) 0.5 MG tablet As directed (Patient not taking: Reported on 04/26/2014) 53 tablet 0 Not Taking at Unknown time    Patient Stressors: Substance abuse  Patient Strengths: Ability for insight Active sense of humor Average or above average intelligence Capable of independent living Communication skills General fund of knowledge Work skills  Treatment Modalities: Medication Management, Group therapy, Case management,  1 to 1 session with clinician, Psychoeducation, Recreational therapy.   Physician Treatment Plan for Primary Diagnosis: Substance induced mood disorder ([place]) Long Term Goal(s): Improvement in symptoms so as ready for discharge Improvement in symptoms so as ready for discharge  Short Term Goals: Ability to identify changes in lifestyle to reduce recurrence of condition will improve Ability to verbalize feelings will improve Ability to disclose and discuss suicidal ideas Ability to demonstrate self-control will improve Ability to identify and develop effective coping behaviors will improve Ability to maintain clinical measurements within normal limits will improve Compliance with prescribed medications will improve Ability to identify triggers associated with substance abuse/mental health issues  will improve Ability to identify changes in lifestyle to reduce recurrence of condition will improve Ability to verbalize feelings will improve Ability to disclose and discuss suicidal ideas Ability to demonstrate self-control will improve Ability to identify and develop effective coping behaviors will improve Ability to maintain clinical measurements within normal limits will improve Compliance with prescribed medications will improve Ability to identify triggers associated with substance abuse/mental health issues will improve  Medication Management: Evaluate patient's response, side effects, and tolerance of medication regimen.  Therapeutic Interventions: 1 to 1 sessions, Unit Group sessions and Medication administration.  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[place])  Long Term Goal(s): Improvement in symptoms so as ready for discharge Improvement in symptoms so as ready for discharge   Short Term Goals: Ability to identify changes in lifestyle to reduce recurrence of condition will improve Ability to verbalize feelings will improve Ability to disclose and discuss suicidal ideas Ability to demonstrate self-control will improve Ability to identify and develop effective coping behaviors will improve Ability to maintain clinical measurements within normal limits will improve Compliance with prescribed medications will improve Ability to identify triggers associated with substance abuse/mental health issues will improve Ability to identify changes in lifestyle to reduce recurrence of condition will improve Ability to verbalize feelings will improve Ability to disclose and discuss suicidal ideas Ability to demonstrate self-control will improve Ability to identify and develop effective coping  behaviors will improve Ability to maintain clinical measurements within normal limits will improve Compliance with prescribed medications will improve Ability to identify triggers associated with substance abuse/mental health issues will improve     Medication Management: Evaluate patient's response, side effects, and tolerance of medication regimen.  Therapeutic Interventions: 1 to 1 sessions, Unit Group sessions and Medication administration.  Evaluation of Outcomes: Progressing   RN Treatment Plan for Primary Diagnosis: Substance induced mood disorder ([place]) Long Term Goal(s): Knowledge of disease and therapeutic regimen to maintain health will improve  Short Term Goals: Ability to remain free from injury will improve and Ability to demonstrate self-control  Medication Management: RN will administer medications as ordered by provider, will assess and evaluate patient's response and provide education to patient for prescribed medication. RN will report any adverse and/or side effects to prescribing provider.  Therapeutic Interventions: 1 on 1 counseling sessions, Psychoeducation, Medication administration, Evaluate responses to treatment, Monitor vital signs and CBGs as ordered, Perform/monitor CIWA, COWS, AIMS and Fall Risk screenings as ordered, Perform wound care treatments as ordered.  Evaluation of Outcomes: Progressing   LCSW Treatment Plan for Primary Diagnosis: Substance induced mood disorder ([place]) Long Term Goal(s): Safe transition to appropriate next level of care at discharge, Engage patient in therapeutic group addressing interpersonal concerns.  Short Term Goals: Engage patient in aftercare planning with referrals and resources  Therapeutic Interventions: Assess for all discharge needs, 1 to 1 time with Social worker, Explore available resources and support systems, Assess for adequacy in  community support network, Educate family and significant other(s) on suicide  prevention, Complete Psychosocial Assessment, Interpersonal group therapy.  Evaluation of Outcomes: Not Met   Progress in Treatment: Attending groups: Yes. Participating in groups: Yes. Taking medication as prescribed: Yes. Toleration medication: Yes. Family/Significant other contact made: No, will contact:  Father/sister Patient understands diagnosis: Yes. Discussing patient identified problems/goals with staff: Yes. Medical problems stabilized or resolved: Yes. Denies suicidal/homicidal ideation: Yes. Issues/concerns per patient self-inventory: Yes. Other:   New problem(s) identified: No, Describe:  none currently  New Short Term/Long Term Goal(s):  Discharge Plan or Barriers:  Patient assessing his options with outpatient vs inpatient treatment.  Reason for Continuation of Hospitalization: Anxiety Depression Medical Issues Medication stabilization Withdrawal symptoms  Estimated Length of Stay: 3-5 days  Attendees: Patient: 04/24/2016 2:23 PM  Physician: Parke Poisson 04/24/2016 2:23 PM  Nursing: Rise Paganini, RN 04/24/2016 2:23 PM  RN Care Manager: Anderson Malta, RN CM 04/24/2016 2:23 PM  Social Worker:  Jarrett Soho, Marlinda Mike 04/24/2016 2:23 PM  Recreational Therapist:  04/24/2016 2:23 PM  Other:  04/24/2016 2:23 PM  Other:  04/24/2016 2:23 PM  Other: 04/24/2016 2:23 PM    Scribe for Treatment Team: Lilly Cove, LCSW 04/24/2016 2:23 PM

## 2016-04-24 NOTE — Plan of Care (Signed)
Problem: Education: Goal: Ability to state activities that reduce stress will improve Outcome: Progressing Nurse discussed anxiety/coping skills with patient.    

## 2016-04-24 NOTE — [facility] Group Notes (Signed)
[facility] LCSW Group Therapy  04/24/2016 2:33 PM  Type of Therapy: Mental Health Association Presentation  Participation Level: Active  Participation Quality: Attentive  Affect: Appropriate  Cognitive: Oriented  Insight: Developing/Improving  Engagement in Therapy: Engaged  Modes of Intervention: Discussion, Education and Socialization  Summary of Progress/Problems: Mental Health Association (MHA) Speaker came to talk about his personal journey with substance abuse and addiction. The pt processed ways by which to relate to the speaker. MHA speaker provided handouts and educational information pertaining to groups and services offered by [facility]. Pt was engaged in speaker's presentation and was receptive to resources provided.    Raye SorrowCoble, Alex [name] 04/24/2016, 2:33 PM

## 2016-04-24 NOTE — [facility] Counselor (Signed)
Adult Comprehensive Assessment  Patient ID: ARLO BUTT, male   DOB: [birth year], 31 y.o.   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 Stable housing and able to return at discharge. Reports this is a positive situation. How long has patient lived in current situation?: few years What is atmosphere in current home: Loving, Supportive  Family History:  Marital status: Single Are you sexually active?: No Does patient have children?: Yes How many children?: 1 How is patient's relationship with their children?: patient reports he gave up all rights of son, no contact.  Childhood History:  By whom was/is the patient raised?: Father Additional childhood history information: Born and raised in [place], [place].  His amother struggled with addiction and left patient when he was 46 months old. Description of patient's relationship with caregiver when they were a child: Patient had no relationship with his mother and raised by father, no adversities, but patient reports he did have behavior problems and ODD.  Patient's description of current relationship with people who raised him/her: no contact currently.  Lives with his sister How were you disciplined when you got in trouble as a child/adolescent?: No adversity with discipline, nothing out of the ordinary. Does patient have siblings?: Yes Number of Siblings: 1 Description of patient's current relationship with siblings: patient lives with his sister and has a positive relationship. Did patient suffer any verbal/emotional/physical/sexual abuse as a child?: No Did patient suffer from severe childhood neglect?:  No Has patient ever been sexually abused/assaulted/raped as an adolescent or adult?: No Was the patient ever a victim of a crime or a disaster?: No Witnessed domestic violence?: No Has patient been effected by domestic violence as an adult?: No  Education:  Highest grade of school patient has completed: 11th, but dropped out.  Reports he did go back and get his GED Currently a student?: No Learning disability?: No  Employment/Work Situation:   Employment situation: Employed Where is patient currently employed?: [employer] How long has patient been employed?: 6 months Patient's job has been impacted by current illness: No What is the longest time patient has a held a job?: 6 months-1 year Where was the patient employed at that time?: see above Has patient ever been in the [employer]?: No Has patient ever served in combat?: No Did You Receive Any Psychiatric Treatment/Services While in Equ[employer]?: No Are There Guns or Other Weapons in Your Home?: Yes Types of Guns/Weapons: upon admission patient was standing with a hand gun when police arrived.  Per report patient has 2 guns. Are These Weapons Safely Secured?: No Who Could Verify You Are Able To Have These Secured:: Unknown at this time.  Will address with patient and also his sister with regards to weapon.  Police have one of the Cabin crew Resources:   Financial resources: Income from employment, Support from parents / caregiver Does patient have a Lawyer or guardian?: No  Alcohol/Substance Abuse:   What has been your use of drugs/alcohol within the last 12 months?: alcohol, THC, cocaine, opiates, benzos If attempted suicide, did drugs/alcohol play a role in this?: Yes Alcohol/Substance Abuse Treatment Hx: Past Tx, Inpatient, Attends AA/NA If yes, describe treatment: patient completed 30 day program: [facility] Has  alcohol/substance abuse ever caused legal problems?: No  Social Support System:   Patient's  Community Support System: Fair Describe Community Support System: Patient reports he has a job, positive family support and friends. Type of faith/religion: denies How does patient's faith help to cope with current illness?: NA  Leisure/Recreation:   Leisure and Hobbies: reports no fun because of all the guilt and drinking he does  Strengths/Needs:   What things does the patient do well?: hard worker In what areas does patient struggle / problems for patient: addiction, say and doing stupid things  Discharge Plan:   Does patient have access to transportation?: Yes Will patient be returning to same living situation after discharge?: Yes Currently receiving community mental health services: No If no, would patient like referral for services when discharged?: Yes (What county?) (patient would like options and review re: outpatient and residental) Does patient have financial barriers related to discharge medications?: Yes Patient description of barriers related to discharge medications: limited income, no insurance  Summary/Recommendations:   Summary and Recommendations (to be completed by the evaluator): [name] M Hoefer is an 31 y.o. male who presents to the ED under IVC. According to the chart, the police report stated that the pt sent a text message to his friend saying that he wanted to "end it all." Pt reports he sent a message to his friend and his friend "misunderstood the message" and called the police because he thought he was suicidal. Pt denies that he is suicidal. According to the chart, pt "pulled a gun on police." Pt reports he was holding the door and when he saw that the police were at the door he dropped the gun. Pt continued to laugh at inappropriate times during the assessment stating "I am quite intelligent if I wanted to kill myself I would be dead already. I have tons of guns at home."   Patient will benefit from crisis stablization, after care planning and group therapy to  address depression, SI and addiction issues.  Raye SorrowCoble, Denasia Venn N. 04/24/2016

## 2016-04-24 NOTE — Progress Notes (Signed)
Patient requested order for anxiety medication.  Patient does not have any tremors, chilling, sweating, etc for librium medication.

## 2016-04-24 NOTE — Plan of Care (Signed)
Problem: Safety: Goal: Periods of time without injury will increase Outcome: Progressing Pt. remains a low fall risk, denies SI/HI/AVH at this time, Q 15 checks in place for safety.    

## 2016-04-24 NOTE — [facility] Group Notes (Signed)
[facility] Group Notes:  (Nursing/MHT/Case Management/Adjunct)  Date:  04/24/2016  Time:  0900  Type of Therapy:  Nurse Education  Participation Level:  Active  Participation Quality:  Attentive  Affect:  Anxious  Cognitive:  Alert  Insight:  Improving  Engagement in Group:  Engaged  Modes of Intervention:  Education  Summary of Progress/Problems: Patient attended and participated in group.  Merian CapronFriedman, Gwenlyn Hottinger [facility] 04/24/2016, 0930

## 2016-04-24 NOTE — H&P (Addendum)
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 Polysubstance dependence ([place]) [F19.20] 04/23/2016  . Major depressive disorder, single episode, severe without psychotic features ([place]) [F32.2] 04/23/2016  . Major depressive disorder, single episode, severe without psychosis ([place]) [F32.2] 04/23/2016  . Tobacco use disorder [F17.200] 02/01/2014  . ADHD (attention deficit hyperactivity disorder) [F90.9]    History of Present Illness:  31 yo Caucasian male, single, lives with his sister. Gainfully employed. Presented to the emergency room via the police. He was involuntarily committed by the police. His friend called the police after patient sent them electronic messages indicating that he was considering suicide. Patient was armed with a hand gun when the police showed up. His BAL at presentation was 139 mg/dl. His UDS was positive for cocaine, THC and benzodiazepines. Other laboratory parameters were essentially normal except for mild hypokalemia. At interview, patient reports that he has a long history of SUD. Says he stayed sober for five years. He relapsed about a year ago. Patient reports daily use of alcohol. Says he uses cocaine occasionally. States that end of his relationship with Alex [name] was the trigger.  " I regret the things I did in my past ,,,, it drives me to drink ,,,, I rather feel numb than guilty ,,,, I like the feeling of getting high ,,,,, I have fun when I am high ,,, I am not depressed and then drink,,,,,, rather I get depressed and anxious after I have used ,,,,, I felt well and normal for the five years I did not use ,,,, I have never  attempted suicide in my life ,,,,,, nothing so bad in my life ,,,,,, when I drink I say stupid things ,,,, I carry a gun on me all the time ,,,,, The neighborhood I live in makes me carry" Patient states that he was intoxicated when he sent the text messages. Says he never meant to kill himself. Says he can understand why his friends read it the other way. Patient states that he has not really gotten over the end of his relationship with Alex [name]. Says she took his cat and left him. Patient states that he has a son whom he gave up all rights on. Says when sober, he sometimes feels guilty as he is not sure how his son is faring in foster care.  Patient reports he does not have any suicidal thoughts at this time. Says he wants to work on his addiction. He has had withdrawal seizures in the past. He typically gets blackouts when drunk. No associated hallucinations. No associated delusions. No passivity phenomena. Currently he does not feel depressed. He notes that his sleep wake cycle varies with drug use. He has normal appetite. Patient states that he is able to think clearly now. No racing thoughts. He feels calm and not anxious. He denies any other stressors at this time. Associated Signs/Symptoms: Depression Symptoms:  as above (Hypo) Manic Symptoms:  None Anxiety Symptoms:  None Psychotic Symptoms:  None PTSD Symptoms: None Total Time spent with patient: 1 hour  Past Psychiatric History: Long history of substance use (alcohol, cocaine, THC, benzodiazepines). He was admitted once in 2012. This was in context of substance use. He detoxed and did 30 day  inpatient care at [facility]. He engaged with AA for two years afterwards. He was prescribed citalopram and Trazodone in the past. Says he did not really have any benefit from them and stopped them almost immediately. He has done well mentally for years without medications and substances.  No past history of mania. No past history of psychosis. No past history  of violent behavior. No past history of suicidal attempt. He has two guns at home. The police has one of his guns.   Is the patient at risk to self? Yes.    Has the patient been a risk to self in the past 6 months? No.  Has the patient been a risk to self within the distant past? Yes.    Is the patient a risk to others? No.  Has the patient been a risk to others in the past 6 months? No.  Has the patient been a risk to others within the distant past? No.   Prior Inpatient Therapy:   Prior Outpatient Therapy:    Alcohol Screening: 1. How often do you have a drink containing alcohol?: 4 or more times a week 2. How many drinks containing alcohol do you have on a typical day when you are drinking?: 10 or more 3. How often do you have six or more drinks on one occasion?: Daily or almost daily Preliminary Score: 8 4. How often during the last year have you found that you were not able to stop drinking once you had started?: Daily or almost daily 5. How often during the last year have you failed to do what was normally expected from you becasue of drinking?: Never 6. How often during the last year have you needed a first drink in the morning to get yourself going after a heavy drinking session?: Weekly 7. How often during the last year have you had a feeling of guilt of remorse after drinking?: Daily or almost daily 8. How often during the last year have you been unable to remember what happened the night before because you had been drinking?: Weekly 9. Have you or someone else been injured as a result of your drinking?: No 10. Has a relative or friend or a doctor or another health worker been concerned about your drinking or suggested you cut down?: Yes, during the last year Alcohol Use Disorder Identification Test Final Score (AUDIT): 30 Brief Intervention: Yes Substance Abuse History in the last 12 months:  Yes.   Consequences of Substance Abuse: Family Consequences:  End of his previous  relationship Blackouts:  typically when drunk Withdrawal Symptoms:   shakes and seizures.  Previous Psychotropic Medications: Yes  Psychological Evaluations: Yes  Past Medical History:  Past Medical History:  Diagnosis Date  . ADHD (attention deficit hyperactivity disorder)   . Allergy   . Asthma   . Depressed   . ETOH abuse   . History of suicidal tendencies    History reviewed. No pertinent surgical history. Family History:  Family History  Problem Relation Age of Onset  . COPD Other    Family Psychiatric  History: Strong history of substance use disorder on both sides of his family.  No family history of any other mental illness. No family history of suicide.  Tobacco Screening: Have you used any form of tobacco in the last 30 days? (Cigarettes, Smokeless Tobacco, Cigars, and/or Pipes): Yes Tobacco use, Select all that apply: 5 or more cigarettes per day Are you interested in Tobacco Cessation Medications?: Yes, will notify  MD for an order Counseled patient on smoking cessation including recognizing danger situations, developing coping skills and basic information about quitting provided: Yes Social History:  History  Alcohol Use  . Yes    Comment: 5th of vodka daily     History  Drug Use  . Frequency: 6.0 times per week  . Types: Marijuana, Cocaine    Additional Social History:     Patient was born and raised in [place]. He was brought up by his father. His mother struggled with addiction and left when patient was 51 months old. He was well adjusted as a child. No childhood adversities. He got along well with his only sister. Says he was very oppositional and did not get along with authority figures. He skipped school a lot. He dropped out at 11th grade. He later got his GED. Patient has never been married. He has a son whom he never met. He is currently in a relationship. Says they both drink and abuse benzodiazepines together. Patient has been on his current job for six  months. No issues at work. He denies any past or pending legal issues. No military experience.      Allergies:  No Known Allergies Lab Results:  Results for orders placed or performed during the hospital encounter of 04/22/16 (from the past 48 hour(s))  CBC with Differential/Platelet     Status: None   Collection Time: 04/23/16 12:50 AM  Result Value Ref Range   WBC 10.0 4.0 - 10.5 K/uL   RBC 4.78 4.22 - 5.81 MIL/uL   Hemoglobin 14.9 13.0 - 17.0 g/dL   HCT 43.5 39.0 - 52.0 %   MCV 91.0 78.0 - 100.0 fL   MCH 31.2 26.0 - 34.0 pg   MCHC 34.3 30.0 - 36.0 g/dL   RDW 12.9 11.5 - 15.5 %   Platelets 288 150 - 400 K/uL   Neutrophils Relative % 52 %   Neutro Abs 5.3 1.7 - 7.7 K/uL   Lymphocytes Relative 32 %   Lymphs Abs 3.2 0.7 - 4.0 K/uL   Monocytes Relative 10 %   Monocytes Absolute 1.0 0.1 - 1.0 K/uL   Eosinophils Relative 5 %   Eosinophils Absolute 0.5 0.0 - 0.7 K/uL   Basophils Relative 1 %   Basophils Absolute 0.1 0.0 - 0.1 K/uL  Comprehensive metabolic panel     Status: Abnormal   Collection Time: 04/23/16 12:50 AM  Result Value Ref Range   Sodium 143 135 - 145 mmol/L   Potassium 3.4 (L) 3.5 - 5.1 mmol/L   Chloride 112 (H) 101 - 111 mmol/L   CO2 24 22 - 32 mmol/L   Glucose, Bld 86 65 - 99 mg/dL   BUN 9 6 - 20 mg/dL   Creatinine, Ser 0.76 0.61 - 1.24 mg/dL   Calcium 8.9 8.9 - 10.3 mg/dL   Total Protein 7.2 6.5 - 8.1 g/dL   Albumin 4.2 3.5 - 5.0 g/dL   AST 19 15 - 41 U/L   ALT 16 (L) 17 - 63 U/L   Alkaline Phosphatase 57 38 - 126 U/L   Total Bilirubin 0.4 0.3 - 1.2 mg/dL   GFR calc non Af Amer >60 >60 mL/min   GFR calc Af Amer >60 >60 mL/min    Comment: (NOTE) The eGFR has been calculated using the CKD EPI equation. This calculation has not been validated in all clinical situations. eGFR's persistently <60 mL/min signify possible Chronic Kidney Disease.    Anion gap 7 5 -  15  Ethanol     Status: Abnormal   Collection Time: 04/23/16 12:50 AM  Result Value Ref Range    Alcohol, Ethyl (B) 139 (H) <5 mg/dL    Comment:        LOWEST DETECTABLE LIMIT FOR SERUM ALCOHOL IS 5 mg/dL FOR MEDICAL PURPOSES ONLY   Salicylate level     Status: None   Collection Time: 04/23/16 12:50 AM  Result Value Ref Range   Salicylate Lvl <2.1 2.8 - 30.0 mg/dL  Acetaminophen level     Status: Abnormal   Collection Time: 04/23/16 12:50 AM  Result Value Ref Range   Acetaminophen (Tylenol), Serum <10 (L) 10 - 30 ug/mL    Comment:        THERAPEUTIC CONCENTRATIONS VARY SIGNIFICANTLY. A RANGE OF 10-30 ug/mL MAY BE AN EFFECTIVE CONCENTRATION FOR MANY PATIENTS. HOWEVER, SOME ARE BEST TREATED AT CONCENTRATIONS OUTSIDE THIS RANGE. ACETAMINOPHEN CONCENTRATIONS >150 ug/mL AT 4 HOURS AFTER INGESTION AND >50 ug/mL AT 12 HOURS AFTER INGESTION ARE OFTEN ASSOCIATED WITH TOXIC REACTIONS.   Rapid urine drug screen (hospital performed)     Status: Abnormal   Collection Time: 04/23/16 12:50 AM  Result Value Ref Range   Opiates NONE DETECTED NONE DETECTED   Cocaine POSITIVE (A) NONE DETECTED   Benzodiazepines POSITIVE (A) NONE DETECTED   Amphetamines NONE DETECTED NONE DETECTED   Tetrahydrocannabinol POSITIVE (A) NONE DETECTED   Barbiturates NONE DETECTED NONE DETECTED    Comment:        DRUG SCREEN FOR MEDICAL PURPOSES ONLY.  IF CONFIRMATION IS NEEDED FOR ANY PURPOSE, NOTIFY LAB WITHIN 5 DAYS.        LOWEST DETECTABLE LIMITS FOR URINE DRUG SCREEN Drug Class       Cutoff (ng/mL) Amphetamine      1000 Barbiturate      200 Benzodiazepine   308 Tricyclics       657 Opiates          300 Cocaine          300 THC              50     Blood Alcohol level:  Lab Results  Component Value Date   ETH 139 (H) 04/23/2016   ETH (H) 06/16/2010    233        LOWEST DETECTABLE LIMIT FOR SERUM ALCOHOL IS 5 mg/dL FOR MEDICAL PURPOSES ONLY    Metabolic Disorder Labs:  No results found for: HGBA1C, MPG No results found for: PROLACTIN No results found for: CHOL, TRIG, HDL,  CHOLHDL, VLDL, LDLCALC  Current Medications: Current Facility-Administered Medications  Medication Dose Route Frequency Provider Last Rate Last Dose  . acetaminophen (TYLENOL) tablet 650 mg  650 mg Oral Q4H PRN Patrecia Pour, NP      . albuterol (PROVENTIL HFA;VENTOLIN HFA) 108 (90 Base) MCG/ACT inhaler 2 puff  2 puff Inhalation Q6H PRN Patrecia Pour, NP   2 puff at 04/24/16 [phone]  . alum & mag hydroxide-simeth (MAALOX/MYLANTA) 200-200-20 MG/5ML suspension 30 mL  30 mL Oral Q4H PRN Patrecia Pour, NP      . ibuprofen (ADVIL,MOTRIN) tablet 600 mg  600 mg Oral Q8H PRN Patrecia Pour, NP      . LORazepam (ATIVAN) tablet 0-4 mg  0-4 mg Oral Q6H Patrecia Pour, NP   1 mg at 04/24/16 0630   Followed by  . [START ON 04/26/2016] LORazepam (ATIVAN) tablet 0-4 mg  0-4 mg Oral Q12H Asa Saunas  Lord, NP      . magnesium hydroxide (MILK OF MAGNESIA) suspension 30 mL  30 mL Oral Daily PRN Patrecia Pour, NP      . nicotine polacrilex (NICORETTE) gum 2 mg  2 mg Oral PRN Jenne Campus, MD   2 mg at 04/23/16 1836  . ondansetron (ZOFRAN) tablet 4 mg  4 mg Oral Q8H PRN Patrecia Pour, NP      . thiamine (VITAMIN B-1) tablet 100 mg  100 mg Oral Daily Patrecia Pour, NP   100 mg at 04/24/16 0825   PTA Medications: Prescriptions Prior to Admission  Medication Sig Dispense Refill Last Dose  . albuterol (PROVENTIL HFA;VENTOLIN HFA) 108 (90 Base) MCG/ACT inhaler Inhale 2 puffs into the lungs every 4 (four) hours as needed for wheezing. 1 Inhaler 0 Past Month at Unknown time  . albuterol (PROVENTIL) (2.5 MG/3ML) 0.083% nebulizer solution Take 3 mLs (2.5 mg total) by nebulization every 6 (six) hours as needed for wheezing or shortness of breath. (Patient not taking: Reported on 04/23/2016) 150 mL 0 Not Taking at Unknown time  . beclomethasone (QVAR) 40 MCG/ACT inhaler Inhale 1 puff into the lungs every morning. (Patient not taking: Reported on 04/23/2016) 1 Inhaler 0 Not Taking at Unknown time  . predniSONE (DELTASONE)  20 MG tablet Take 2 tablets (40 mg total) by mouth daily. (Patient not taking: Reported on 04/23/2016) 10 tablet 0 Not Taking at Unknown time  . varenicline (CHANTIX CONTINUING MONTH PAK) 1 MG tablet Take 1 tablet (1 mg total) by mouth 2 (two) times daily. (Patient not taking: Reported on 04/26/2014) 60 tablet 2 Not Taking at Unknown time  . varenicline (CHANTIX) 0.5 MG tablet As directed (Patient not taking: Reported on 04/26/2014) 53 tablet 0 Not Taking at Unknown time    Musculoskeletal: Strength & Muscle Tone: within normal limits Gait & Station: normal Patient leans: N/A  Psychiatric Specialty Exam: Physical Exam  Constitutional: He is oriented to person, place, and time. He appears well-developed and well-nourished.  HENT:  Head: Normocephalic and atraumatic.  Eyes: Conjunctivae and EOM are normal. Pupils are equal, round, and reactive to light.  Neck: Normal range of motion. Neck supple.  Cardiovascular: Normal rate, regular rhythm and normal heart sounds.   Respiratory: Effort normal and breath sounds normal.  GI: Soft. Bowel sounds are normal.  Musculoskeletal: Normal range of motion.  Neurological: He is alert and oriented to person, place, and time. He has normal reflexes.  Skin: Skin is warm and dry.  Psychiatric:  As above    Review of Systems  Constitutional: Negative.   HENT: Negative.   Eyes: Negative.   Respiratory: Negative.   Cardiovascular: Negative.   Gastrointestinal: Negative.   Genitourinary: Negative.   Musculoskeletal: Negative.   Skin: Negative.   Neurological: Negative.   Endo/Heme/Allergies: Negative.   Psychiatric/Behavioral:       As above    Blood pressure 119/73, pulse 72, resp. rate 20, height 5' 8"  (1.727 m), weight 59.4 kg (131 lb).Body mass index is 19.92 kg/m.  General Appearance: Casually dressed, calm and cooperative. No tremors, no sweatiness. Moderate eye contact. Normal psychomotor activity. Not internally distracted.   Eye Contact:   Moderate  Speech:  Normal Rate  Volume:  Normal  Mood:  Feels okay  Affect:  Appropriate and Full Range  Thought Process:  Coherent and Goal Directed  Orientation:  Full (Time, Place, and Person)  Thought Content:  No hopelessness, no worthlessness. Patient is optimistic  about the future. He is focused on getting better. No thoughts of violence.. No delusional theme. No hallucination in any modality.   Suicidal Thoughts:  None currently  Homicidal Thoughts:  No  Memory:  Immediate;   Good Recent;   Good Remote;   Good  Judgement:  Other:  Poor based on recent events  Insight:  Good  Psychomotor Activity:  Normal  Concentration:  Concentration: Good and Attention Span: Good  Recall:  Good  Fund of Knowledge:  Good  Language:  Good  Akathisia:  No  Handed:  Right  AIMS (if indicated):     Assets:  Communication Skills Desire for Improvement Housing Intimacy Physical Health Resilience Social Support  ADL's:  Intact  Cognition:  WNL  Sleep:  Number of Hours: 6.25    Treatment Plan Summary: Patient has insight into his substance use. He wants to stay sober and get his life back. We discussed detox with Chlordiazepoxide. We also explored use of Naltrexone as an anti-craving agent. Patient consented to both after we discussed the risks and benefits. He is also considering residential treatment and AA again as both has helped in the past. We agreed to review his mood after he has come off substances completely.   Observation Level/Precautions:  Detox 15 minute checks Seizure  Laboratory:  None  Psychotherapy:    Medications:    Consultations:    Discharge Concerns:    Estimated LOS:  Other:     Physician Treatment Plan for Primary Diagnosis: <principal problem not specified> Long Term Goal(s): Improvement in symptoms so as ready for discharge  Short Term Goals: Ability to identify changes in lifestyle to reduce recurrence of condition will improve, Ability to verbalize  feelings will improve, Ability to disclose and discuss suicidal ideas, Ability to demonstrate self-control will improve, Ability to identify and develop effective coping behaviors will improve, Ability to maintain clinical measurements within normal limits will improve, Compliance with prescribed medications will improve and Ability to identify triggers associated with substance abuse/mental health issues will improve  Physician Treatment Plan for Secondary Diagnosis: Active Problems:   Major depressive disorder, single episode, severe without psychosis ([place])  Long Term Goal(s): Improvement in symptoms so as ready for discharge  Short Term Goals: Ability to identify changes in lifestyle to reduce recurrence of condition will improve, Ability to verbalize feelings will improve, Ability to disclose and discuss suicidal ideas, Ability to demonstrate self-control will improve, Ability to identify and develop effective coping behaviors will improve, Ability to maintain clinical measurements within normal limits will improve, Compliance with prescribed medications will improve and Ability to identify triggers associated with substance abuse/mental health issues will improve  I certify that inpatient services furnished can reasonably be expected to improve the patient's condition.    [place], MD 2/13/201810:31 AM

## 2016-04-24 NOTE — Progress Notes (Signed)
Recreation Therapy Notes  Animal-Assisted Activity (AAA) Program Checklist/Progress Notes Patient Eligibility Criteria Checklist & Daily Group note for Rec TxIntervention  Date: 02.13.2018 Time: 2:45pm [address]    AAA/T Program Assumption of Risk Form signed by Patient/ or Parent Legal Guardian Yes  Patient is free of allergies or sever asthma Yes  Patient reports no fear of animals Yes  Patient reports no history of cruelty to animals Yes  Patient understands his/her participation is voluntary Yes  Patient washes hands before animal contact Yes  Patient washes hands after animal contact Yes  Behavioral Response: Appropriate   Education:Hand Washing, Appropriate Animal Interaction   Education Outcome: Acknowledges education.   Clinical Observations/Feedback: Patient attended session and interacted appropriately with therapy dog and peers.   Alex [name], LRT/CTRS       Sadao Weyer L 04/24/2016 3:03 PM
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
## 2016-04-24 NOTE — [facility] Suicide Risk Assessment (Signed)
[facility]   Nursing information obtained from:  Patient Demographic factors:  Male, Adolescent or young adult, Caucasian Current Mental Status:  Self-harm behaviors Loss Factors:  Loss of significant relationship Historical Factors:  NA Risk Reduction Factors:  Employed, Living with another person, especially a relative  Total Time spent with patient: 30 minutes Principal Problem: Substance induced mood disorder (HCC) Diagnosis:   Patient Active Problem List   Diagnosis Date Noted  . Substance induced mood disorder (HCC) [F19.94] 04/24/2016  . Polysubstance dependence (HCC) [F19.20] 04/23/2016  . 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 Tobacco use disorder [F17.200] 02/01/2014  . ADHD (attention deficit hyperactivity disorder) [F90.9]    Subjective Data:  31 yo Caucasian male, single, lives with his sister. Gainfully employed. Presented to the emergency room via the police. He was involuntarily committed by the police. His friend called the police after patient sent them electronic messages indicating that he was considering suicide. Patient was armed with a hand gun when the police showed up. His BAL at presentation was 139 mg/dl. His UDS was positive for cocaine, THC and benzodiazepines. Other laboratory parameters were essentially normal except for mild hypokalemia. At interview, patient reports that he has a long history of SUD. Says he stayed sober for five years. He relapsed about a year ago. Patient reports daily use of alcohol. Says he uses cocaine occasionally. States that end of his relationship with Florentina AddisonKatie was the trigger.  " I regret the things I did in my past ,,,, it drives me to drink ,,,, I rather feel numb than guilty ,,,, I like the feeling of getting high ,,,,, I have fun when I am high  ,,, I am not depressed and then drink,,,,,, rather I get depressed and anxious after I have used ,,,,, I felt well and normal for the five years I did not use ,,,, I have never attempted suicide in my life ,,,,,, nothing so bad in my life ,,,,,, when I drink I say stupid things ,,,, I carry a gun on me all the time ,,,,, The neighborhood I live in makes me carry" Patient states that he was intoxicated when he sent the text messages. Says he never meant to kill himself. Says he can understand why his friends read it the other way. Patient states that he has not really gotten over the end of his relationship with [name]. Says she took his cat and left him. Patient states that he has a son whom he gave up all rights on. Says when sober, he sometimes feels guilty as he is not sure how his son is faring in foster care.  Patient reports he does not have any suicidal thoughts at this time. Says he wants to work on his addiction. He has had withdrawal seizures in the past. He typically gets blackouts when drunk. No associated hallucinations. No associated delusions. No passivity phenomena. Currently he does not feel depressed. He notes that his sleep wake cycle varies with drug use. He has normal appetite. Patient states that he is able to think clearly now. No racing thoughts. He feels calm and not anxious. He denies any other stressors at this time.  Continued Clinical Symptoms:  Alcohol Use Disorder Identification Test Final Score (AUDIT): 30 The "Alcohol Use Disorders Identification Test", Guidelines for Use in Primary Care, Second Edition.  World Science writerHealth Organization [facility](WHO).  Score between 0-7:  no or low risk or alcohol related problems. Score between 8-15:  moderate risk of alcohol related problems. Score between 16-19:  high risk of alcohol related problems. Score 20 or above:  warrants further diagnostic evaluation for alcohol dependence and treatment.   CLINICAL FACTORS:  Substance use Access to  weapons    Musculoskeletal: Strength & Muscle Tone: within normal limits Gait & Station: normal Patient leans: NA  Psychiatric Specialty Exam: Physical Exam See H&P  ROS See H&P  Blood pressure 119/73, pulse 72, resp. rate 20, height 5\' 8"  (1.727 m), weight 59.4 kg (131 lb).Body mass index is 19.92 kg/m.  General Appearance: See H&P  Eye Contact:  See H&P  Speech:  See H&P  Volume:  See H&P  Mood:  See H&P  Affect:  See H&P  Thought Process:  See H&P  Orientation:  See H&P  Thought Content:  See H&P  Suicidal Thoughts:  See H&P  Homicidal Thoughts:  See H&P  Memory:  See H&P  Judgement:  See H&P  Insight:  See H&P  Psychomotor Activity:  See H&P  Concentration:  See H&P  Recall:  See H&P  Fund of Knowledge:  See H&P  Language:  See H&P  Akathisia:  See H&P  Handed:  See H&P  AIMS (if indicated):     Assets:  See H&P  ADL's:  See H&P  Cognition:  See H&P  Sleep:  Number of Hours: 6.25      COGNITIVE FEATURES THAT CONTRIBUTE TO RISK:  None    SUICIDE RISK:   Moderate:  Frequent suicidal ideation with limited intensity, and duration, some specificity in terms of plans, no associated intent, good self-control, limited dysphoria/symptomatology, some risk factors present, and identifiable protective factors, including available and accessible social support.  PLAN OF CARE:  1. Alcohol withdrawal protocol 2. Naltrexone 50 mg daily 3. Addiction related groups and unit activities 4. Motivational enhancement  5. SW would help facilitate residential treatment  I certify that inpatient services furnished can reasonably be expected to improve the patient's condition.   Georgiann Cocker, MD 04/24/2016, 11:15 AM

## 2016-04-25 NOTE — Progress Notes (Signed)
[facility]  04/25/2016 6:23 PM Alex [name]  MRN:  [id number] Subjective:   31 yo Caucasian male, single, lives with his sister. Gainfully employed. Presented to the emergency room via the police. He was involuntarily committed by the police. His friend called the police after patient sent them electronic messages indicating that he was considering suicide. Patient was armed with a hand gun when the police showed up. His BAL at presentation was 139 mg/dl. His UDS was positive for cocaine, THC and benzodiazepines. Other laboratory parameters were essentially normal except for mild hypokalemia.  Seen today. Says he is coming off alcohol well. He feels good today. In good mood. Says he spoke with his sister today. She plans to ask their roommate who drinks to move out. Patient states that he has been processing ways of staying sober. He hopes he can get back to work soon. No thoughts of suicide. No thoughts of violence. No hallucination in any modality. No feeling of persecution. No nausea retching or vomiting.  Nursing staff reports that he has bene appropriate. He has been socializing with peers. No withdrawal symptoms. No behavioral issues. He has not voiced any suicidal thoughts.   Principal Problem: Substance induced mood disorder (HCC) Diagnosis:   Patient Active Problem List   Diagnosis Date Noted  . Substance induced mood disorder (HCC) [F19.94] 04/24/2016  . Polysubstance dependence (HCC) [F19.20] 04/23/2016  . 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 Tobacco use disorder [F17.200] 02/01/2014  . ADHD (attention deficit hyperactivity disorder) [F90.9]    Total Time spent with patient: 20 minutes  Past Psychiatric History: See H&P  Past Medical History:  Past Medical History:  Diagnosis Date  . ADHD (attention  deficit hyperactivity disorder)   . Allergy   . Asthma   . Depressed   . ETOH abuse   . History of suicidal tendencies    History reviewed. No pertinent surgical history. Family History:  Family History  Problem Relation Age of Onset  . COPD Other    Family Psychiatric  History: See H&P Social History:  History  Alcohol Use  . Yes    Comment: 5th of vodka daily     History  Drug Use  . Frequency: 6.0 times per week  . Types: Marijuana, Cocaine    Social History   Social History  . Marital status: Single    Spouse name: N/A  . Number of children: N/A  . Years of education: N/A   Social History Main Topics  . Smoking status: Current Every Day Smoker    Packs/day: 1.00    Years: 10.00    Types: Cigarettes  . Smokeless tobacco: Never Used  . Alcohol use Yes     Comment: 5th of vodka daily  . Drug use: Yes    Frequency: 6.0 times per week    Types: Marijuana, Cocaine  . Sexual activity: Not Currently   Other Topics Concern  . None   Social History Narrative  . None   Additional Social History:      Sleep: Good  Appetite:  Good  Current Medications: Current Facility-Administered Medications  Medication Dose Route Frequency Provider Last Rate Last Dose  . acetaminophen (TYLENOL) tablet 650 mg  650 mg Oral Q4H PRN Charm RingsJamison Y Lord, NP      . albuterol (PROVENTIL HFA;VENTOLIN HFA) 108 (90 Base) MCG/ACT inhaler 2  puff  2 puff Inhalation Q6H PRN Charm Rings, NP   2 puff at 04/24/16 2217  . alum & mag hydroxide-simeth (MAALOX/MYLANTA) 200-200-20 MG/5ML suspension 30 mL  30 mL Oral Q4H PRN Charm Rings, NP      . chlordiazePOXIDE (LIBRIUM) capsule 25 mg  25 mg Oral Q6H PRN Georgiann Cocker, MD      . ibuprofen (ADVIL,MOTRIN) tablet 600 mg  600 mg Oral Q8H PRN Charm Rings, NP      . magnesium hydroxide (MILK OF MAGNESIA) suspension 30 mL  30 mL Oral Daily PRN Charm Rings, NP      . naltrexone (DEPADE) tablet 50 mg  50 mg Oral Daily Georgiann Cocker,  MD   50 mg at 04/25/16 0901  . nicotine polacrilex (NICORETTE) gum 2 mg  2 mg Oral PRN Craige Cotta, MD   2 mg at 04/25/16 1315  . ondansetron (ZOFRAN) tablet 4 mg  4 mg Oral Q8H PRN Charm Rings, NP      . thiamine (VITAMIN B-1) tablet 100 mg  100 mg Oral Daily Charm Rings, NP   100 mg at 04/25/16 0901  . traZODone (DESYREL) tablet 50 mg  50 mg Oral QHS,MR X 1 Kerry Hough, PA-C   50 mg at 04/24/16 2216    Lab Results: No results found for this or any previous visit (from the past 48 hour(s)).  Blood Alcohol level:  Lab Results  Component Value Date   ETH 139 (H) 04/23/2016   ETH (H) 06/16/2010    233        LOWEST DETECTABLE LIMIT FOR SERUM ALCOHOL IS 5 mg/dL FOR MEDICAL PURPOSES ONLY    Metabolic Disorder Labs: No results found for: HGBA1C, MPG No results found for: PROLACTIN No results found for: CHOL, TRIG, HDL, CHOLHDL, VLDL, LDLCALC  Physical Findings: AIMS: Facial and Oral Movements Muscles of Facial Expression: None, normal Lips and Perioral Area: None, normal Jaw: None, normal Tongue: None, normal,Extremity Movements Upper (arms, wrists, hands, fingers): None, normal Lower (legs, knees, ankles, toes): None, normal, Trunk Movements Neck, shoulders, hips: None, normal, Overall Severity Severity of abnormal movements (highest score from questions above): None, normal Incapacitation due to abnormal movements: None, normal Patient's awareness of abnormal movements (rate only patient's report): No Awareness, Dental Status Current problems with teeth and/or dentures?: No Does patient usually wear dentures?: No  CIWA:  CIWA-Ar Total: 0 COWS:  COWS Total Score: 1  Musculoskeletal: Strength & Muscle Tone: within normal limits Gait & Station: normal Patient leans: N/A  Psychiatric Specialty Exam: Physical Exam  ROS  Blood pressure (!) 128/91, pulse 65, temperature 97.8 F (36.6 C), temperature source Oral, resp. rate 16, height 5\' 8"  (1.727 m), weight  59.4 kg (131 lb).Body mass index is 19.92 kg/m.  General Appearance: Pleasant, good rapport. Approrpiate behavior  Eye Contact:  Good  Speech:  Normal Rate  Volume:  Normal  Mood:  Anxious and worries about getting back to work soon.   Affect:  Appropriate and Full Range  Thought Process:  Goal Directed  Orientation:  Full (Time, Place, and Person)  Thought Content:  No thoughts of violence. No hallucination in any modality. No delusional theme.   Suicidal Thoughts:  No  Homicidal Thoughts:  No  Memory:  Immediate;   Good Recent;   Good Remote;   Good  Judgement:  Good  Insight:  Good  Psychomotor Activity:  Normal  Concentration:  Concentration: Good  and Attention Span: Good  Recall:  Good  Fund of Knowledge:  Good  Language:  Good  Akathisia:  No  Handed:    AIMS (if indicated):     Assets:  Communication Skills Desire for Improvement Financial Resources/Insurance Housing Resilience Social Support  ADL's:  Intact  Cognition:  WNL  Sleep:  Number of Hours: 6.75     Treatment Plan Summary: Patient is coming off alcohol smoothly. He is not a danger to self or others. We are still evaluating him.   PLAN: 1. Continue current regimen 2. Continue to monitor mood, behavior and interaction with peers. 3. Collateral from is family  Georgiann Cocker, MD 04/25/2016, 6:23 PM

## 2016-04-25 NOTE — [facility] Group Notes (Signed)
[facility] LCSW Group Therapy  04/25/2016 11:47 AM  Type of Therapy:  Group Therapy  Participation Level:  Minimal  Participation Quality:  Resistant  Affect:  Blunted  Cognitive:  Alert and Appropriate  Insight:  Developing/Improving  Engagement in Therapy:  Limited  Modes of Intervention:  Discussion and Exploration  Summary of Progress/Problems:The purpose of this group is to assist patients in learning to regulate negative emotions and experience positive emotions. Patients will be guided to discuss ways in which they have been vulnerable to their negative emotions. These vulnerabilities will be juxtaposed with experiences of positive emotions or situations, and patients challenged to use positive emotions to combat negative ones. Special emphasis will be placed on coping with negative emotions in conflict situations, and patients will process healthy conflict resolution skills.  Therapeutic Goals: 1. Patient will identify two positive emotions or experiences to reflect on in order to balance out negative emotions:  2. Patient will label two or more emotions that they find the most difficult to experience:  Patient will be able to demonstrate positive conflict resolution skills through discussion or role plays:   Alex [name] colored through most of group, but was paying attention and at times would contribute to the conversation about how his past experiences contribute to how he regulates his emotions.  He was minimizing with regards to how his emotions contribute to how he responds in relationships.  He used humor when he was uncomfortable and attempted to support others while they were sharing.  Alex [name], Zonya Gudger N 04/25/2016, 11:47 AM

## 2016-04-25 NOTE — Progress Notes (Signed)
  DATA ACTION RESPONSE  Objective- Pt. is up and visible in the milieu, interacting and playing cards with peers approprietly. Pt. presents with an animated affect and mood.  Subjective- Denies having any SI/HI/AVH/Pain at this time. Pt. states "I am just a happy person". Pt. continues to be cooperative and remain safe & pleasant on the unit.  1:1 interaction in private to establish rapport. Encouragement, education, & support given from staff. Pt. requested sleep mediation. Provider on-call contacted and order  obtained. Meds. ordered and administered. PRN Nicotine gum and Albuterol requested and will re-eval accordingly.   Safety maintained with Q 15 checks. Continues to follow treatment plan and will monitor closely. No additonal questions/concerns noted.

## 2016-04-25 NOTE — Progress Notes (Signed)
Recreation Therapy Notes  Date: 04/25/16 Time: 0930 Location: [address] Dayroom  Group Topic: Stress Management  Goal Area(s) Addresses:  Patient will verbalize importance of using healthy stress management.  Patient will identify positive emotions associated with healthy stress management.   Behavioral Response: Engaged  Intervention: Stress Management  Activity :  Resilience Meditation.  LRT introduced the stress management technique of meditation.  LRT play the meditation from the Calm app to allow patients to engage in the activity.  Patients were to follow along as the meditation was played to fully engage in the technique.  Education:  Stress Management, Discharge Planning.   Education Outcome: Acknowledges edcuation/In group clarification offered/Needs additional education  Clinical Observations/Feedback: Pt attended group.   Caroll RancherMarjette Tulsi Crossett, LRT/CTRS         Caroll RancherLindsay, Dorie Ohms A 04/25/2016 12:17 PM

## 2016-04-25 NOTE — Progress Notes (Cosign Needed)
Adult Psychoeducational Group Note  Date:  04/25/2016 Time:  1:38 PM  Group Topic/Focus:  Goals Group:   The focus of this group is to help patients establish daily goals to achieve during treatment and discuss how the patient can incorporate goal setting into their daily lives to aide in recovery.  Participation Level:  Active  Participation Quality:  Appropriate  Affect:  Anxious  Cognitive:  Alert  Insight: Improving  Engagement in Group:  Engaged  Modes of Intervention:  Discussion  Additional Comments:  Pt did participate in group this morning, he states that he drank away 5 years of sobriety and feels like  He just wants to continue to drink his life away.  Pt states that he feels that depression is a choice, not a mental condition and that one is selfish for feeling that way.  Pt states that he is going to try to think more positive thoughts and try to take one day at a time. Lindee Leason R Vicent Febles 04/25/2016, 1:38 PM

## 2016-04-25 NOTE — Progress Notes (Signed)
Patient ID: Alex [name], male   DOB: 1985/03/19, 31 y.o.   MRN: [id number]  DAR: Pt. Denies SI/HI and A/V Hallucinations. He reports sleep is fair, appetite is good, energy level is normal, and concentration is good. He rates depression 5/10, hopelessness 0/10, and anxiety 10/10. Patient does not report any pain at this time. Support and encouragement provided to the patient. Scheduled medications administered to patient as well as PRN Nicorette. Patient is minimal with Clinical research associatewriter and forwards little. He is seen in the milieu interacting with peers intermittently. He reports his goal for the day is, "not dying" and he will "not die" is what he will do to meet his goal. Patient does not appear vested in treatment at this time. Q15 minute checks are maintained for safety.

## 2016-04-25 NOTE — Progress Notes (Signed)
Pt attended the evening NA speaker meeting. Pt was engaged and appropriate. Paulanthony Gleaves C, NT 04/25/16 10:41 PM  

## 2016-04-26 MED ORDER — NICOTINE 21 MG/24HR TD PT24
21.0000 mg | MEDICATED_PATCH | Freq: Every day | TRANSDERMAL | Status: DC
  Administered 2016-04-27: 21 mg via TRANSDERMAL
  Filled 2016-04-26 (×4): qty 1

## 2016-04-26 MED ORDER — HYDROXYZINE HCL 50 MG PO TABS
50.0000 mg | ORAL_TABLET | Freq: Four times a day (QID) | ORAL | Status: DC | PRN
  Administered 2016-04-26 – 2016-04-29 (×9): 50 mg via ORAL
  Filled 2016-04-26 (×10): qty 1

## 2016-04-26 MED ORDER — HYDROCORTISONE 0.5 % EX CREA
TOPICAL_CREAM | Freq: Four times a day (QID) | CUTANEOUS | Status: DC | PRN
  Administered 2016-04-26 – 2016-04-28 (×2): via TOPICAL
  Filled 2016-04-26: qty 28.35

## 2016-04-26 NOTE — Progress Notes (Signed)
[facility] Group Notes:  (Nursing/MHT/Case Management/Adjunct)  Date:  04/26/2016  Time:  10:53 PM  Type of Therapy:  Psychoeducational Skills  Participation Level:  Active  Participation Quality:  Appropriate  Affect:  Appropriate  Cognitive:  Appropriate  Insight:  Good  Engagement in Group:  Engaged  Modes of Intervention:  Education  Summary of Progress/Problems: The patient verbalized that he had a pretty good day overall. He states that he had a good visit with his friend and girlfriend this evening. He also stated that he was proud of the fact that he has been eating on a regular basis which is normally an issue for him. His goal for tomorrow is to try to focus on one thing at a time.   Hazle CocaGOODMAN, [name] S 04/26/2016, 10:53 PM

## 2016-04-26 NOTE — Progress Notes (Signed)
[facility]  04/26/2016 5:32 PM Alex Johnhomas M Albee  MRN:  [id number] Subjective:   31 yo Caucasian male, single, lives with his sister. Gainfully employed. Presented to the emergency room via the police. He was involuntarily committed by the police. His friend called the police after patient sent them electronic messages indicating that he was considering suicide. Patient was armed with a hand gun when the police showed up. His BAL at presentation was 139 mg/dl. His UDS was positive for cocaine, THC and benzodiazepines. Other laboratory parameters were essentially normal except for mild hypokalemia.  Nursing staff reports that he has been appropriate on the unit. He has been engaging with peers. No behavioral issues. He has not voiced any thoughts of suicide. He has not been observed to be internally stimulated.  At interview, patient tells me that he feels better each day. Says he spoke with his father and sister. He is encouraged by their support. Patient says he has also been in contact with his employers. He is pleased his job is there. Says he feels good as the alcohol is coming off his system. No perceptual abnormalities. No suicidal thoughts. No thoughts of violence.   Principal Problem: Substance induced mood disorder (HCC) Diagnosis:   Patient Active Problem List   Diagnosis Date Noted  . Substance induced mood disorder (HCC) [F19.94] 04/24/2016  . Polysubstance dependence (HCC) [F19.20] 04/23/2016  . 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 Tobacco use disorder [F17.200] 02/01/2014  . ADHD (attention deficit hyperactivity disorder) [F90.9]    Total Time spent with patient: 20 minutes  Past Psychiatric History: See H&P  Past Medical History:  Past Medical History:  Diagnosis Date  . ADHD (attention deficit  hyperactivity disorder)   . Allergy   . Asthma   . Depressed   . ETOH abuse   . History of suicidal tendencies    History reviewed. No pertinent surgical history. Family History:  Family History  Problem Relation Age of Onset  . COPD Other    Family Psychiatric  History: See H&P Social History:  History  Alcohol Use  . Yes    Comment: 5th of vodka daily     History  Drug Use  . Frequency: 6.0 times per week  . Types: Marijuana, Cocaine    Social History   Social History  . Marital status: Single    Spouse name: N/A  . Number of children: N/A  . Years of education: N/A   Social History Main Topics  . Smoking status: Current Every Day Smoker    Packs/day: 1.00    Years: 10.00    Types: Cigarettes  . Smokeless tobacco: Never Used  . Alcohol use Yes     Comment: 5th of vodka daily  . Drug use: Yes    Frequency: 6.0 times per week    Types: Marijuana, Cocaine  . Sexual activity: Not Currently   Other Topics Concern  . None   Social History Narrative  . None   Additional Social History:      Sleep: Good  Appetite:  Good  Current Medications: Current Facility-Administered Medications  Medication Dose Route Frequency Provider Last Rate Last Dose  . acetaminophen (TYLENOL) tablet 650 mg  650 mg Oral Q4H PRN Charm RingsJamison Y Lord, NP      . albuterol (PROVENTIL HFA;VENTOLIN HFA) 108 (90 Base) MCG/ACT inhaler 2 puff  2 puff Inhalation Q6H PRN Charm Rings, NP   2 puff at 04/26/16 [phone]  . alum & mag hydroxide-simeth (MAALOX/MYLANTA) 200-200-20 MG/5ML suspension 30 mL  30 mL Oral Q4H PRN Charm Rings, NP      . chlordiazePOXIDE (LIBRIUM) capsule 25 mg  25 mg Oral Q6H PRN Georgiann Cocker, MD   25 mg at 04/25/16 1958  . hydrocortisone cream 0.5 %   Topical QID PRN Adonis Brook, NP      . hydrOXYzine (ATARAX/VISTARIL) tablet 50 mg  50 mg Oral Q6H PRN Georgiann Cocker, MD   50 mg at 04/26/16 1624  . ibuprofen (ADVIL,MOTRIN) tablet 600 mg  600 mg Oral Q8H PRN  Charm Rings, NP      . magnesium hydroxide (MILK OF MAGNESIA) suspension 30 mL  30 mL Oral Daily PRN Charm Rings, NP      . naltrexone (DEPADE) tablet 50 mg  50 mg Oral Daily Georgiann Cocker, MD   50 mg at 04/26/16 8295  . nicotine polacrilex (NICORETTE) gum 2 mg  2 mg Oral PRN Craige Cotta, MD   2 mg at 04/26/16 1613  . ondansetron (ZOFRAN) tablet 4 mg  4 mg Oral Q8H PRN Charm Rings, NP      . thiamine (VITAMIN B-1) tablet 100 mg  100 mg Oral Daily Charm Rings, NP   100 mg at 04/26/16 6213  . traZODone (DESYREL) tablet 50 mg  50 mg Oral QHS,MR X 1 Kerry Hough, PA-C   50 mg at 04/25/16 2150    Lab Results: No results found for this or any previous visit (from the past 48 hour(s)).  Blood Alcohol level:  Lab Results  Component Value Date   ETH 139 (H) 04/23/2016   ETH (H) 06/16/2010    233        LOWEST DETECTABLE LIMIT FOR SERUM ALCOHOL IS 5 mg/dL FOR MEDICAL PURPOSES ONLY    Metabolic Disorder Labs: No results found for: HGBA1C, MPG No results found for: PROLACTIN No results found for: CHOL, TRIG, HDL, CHOLHDL, VLDL, LDLCALC  Physical Findings: AIMS: Facial and Oral Movements Muscles of Facial Expression: None, normal Lips and Perioral Area: None, normal Jaw: None, normal Tongue: None, normal,Extremity Movements Upper (arms, wrists, hands, fingers): None, normal Lower (legs, knees, ankles, toes): None, normal, Trunk Movements Neck, shoulders, hips: None, normal, Overall Severity Severity of abnormal movements (highest score from questions above): None, normal Incapacitation due to abnormal movements: None, normal Patient's awareness of abnormal movements (rate only patient's report): No Awareness, Dental Status Current problems with teeth and/or dentures?: No Does patient usually wear dentures?: No  CIWA:  CIWA-Ar Total: 1 COWS:  COWS Total Score: 1  Musculoskeletal: Strength & Muscle Tone: within normal limits Gait & Station: normal Patient  leans: N/A  Psychiatric Specialty Exam: Physical Exam  ROS  Blood pressure 120/71, pulse 64, temperature 97.8 F (36.6 C), temperature source Oral, resp. rate 16, height 5\' 8"  (1.727 m), weight 59.4 kg (131 lb).Body mass index is 19.92 kg/m.  General Appearance: Calm and cooperative. Good eye contact. He was coloring prior to interview. Appropriate behavior.   Eye Contact:  Good  Speech:  Normal Rate  Volume:  Normal  Mood:  Euthymic  Affect:  Appropriate and Full Range  Thought Process:  Goal Directed  Orientation:  Full (Time, Place, and Person)  Thought Content:  No thoughts of violence. No hallucination in any modality. No delusional theme.  Suicidal Thoughts:  No  Homicidal Thoughts:  No  Memory:  Immediate;   Good Recent;   Good Remote;   Good  Judgement:  Good  Insight:  Good  Psychomotor Activity:  Normal  Concentration:  Concentration: Good and Attention Span: Good  Recall:  Good  Fund of Knowledge:  Good  Language:  Good  Akathisia:  No  Handed:    AIMS (if indicated):     Assets:  Communication Skills Desire for Improvement Financial Resources/Insurance Housing Resilience Social Support  ADL's:  Intact  Cognition:  WNL  Sleep:  Number of Hours: 6     Treatment Plan Summary: Patient is detoxing from alcohol smoothly. He is not a danger to self or others. We are finalizing aftercare.  PLAN: 1. Continue current regimen 2. Continue to monitor mood, behavior and interaction with peers. 3. Likely discharge this weekend  Georgiann Cocker, MD 04/26/2016, 5:32 PMPatient ID: Alex [name], male   DOB: June 19, 1985, 30 y.o.   MRN: [id number]

## 2016-04-26 NOTE — Progress Notes (Signed)
D: Court denied SI, HI, and AVH this a.m. Asked how he was doing this morning, pt said, "Well, I was asleep." (Pt was awakened for morning medications.) Pt appeared superficial and glib in his interaction. He requested an albuterol inhaler and nicotine gum. Pt also said he was having an allergic reaction at site of his right antecubital space where tape had been. "What can be done about that?" he asked. Some localized redness noted. On his self inventory, pt reported poor sleep, good appetite, normal energy level and good concentration. He wrote that his goal is "not dying."  A: Meds given as ordered. Order obtained for hydrocortisone cream if needed for pt comfort. Q15 safety checks maintained. Support/encouragement offered.  R: Pt remains free from harm and continues with treatment. Will continue to monitor for needs/safety.

## 2016-04-26 NOTE — Plan of Care (Signed)
Problem: Self-Concept: Goal: Level of anxiety will decrease Outcome: Not Progressing Pt reports levels of anxiety that lead him to request PRN medication.   Problem: Safety: Goal: Ability to remain free from injury will improve Outcome: Adequate for Discharge Pt exhibits few S&S of withdrawal and remains free from injury.   Problem: Activity: Goal: Interest or engagement in activities will improve Outcome: Progressing Pt has been present in milieu and is interacting ably with peers.

## 2016-04-26 NOTE — Progress Notes (Signed)
At the beginning of the shift, pt came to the med window stating that he needed his inhaler and some nicorette gum.  He complained that nothing was helping his anxiety, and that the doctor was not listening to him.  He said that he drinks a lot and that the medications he has received are not helping.  Writer reviewed pt's prn meds and found that he has Librium available.  Pt was educated on the benefits of Librium for detox.  Pt agreed to try it.  He denies SI/HI/AVH, but states his withdrawal symptoms have been moderate this evening.  After the NA group, pt came to get his night time meds, and his attitude was different.  He said the Librium had helped.  He was jovial in conversation with Clinical research associatewriter at that time.  Pt was informed that the Librium was available q6h as needed and be sure and ask for it if he began to feel anxious or shaky.  Pt voiced understanding.  Support and encouragement offered.  Discharge plans are in process.  Safety maintained with q15 minute checks.

## 2016-04-26 NOTE — [facility] Group Notes (Signed)
[facility] LCSW Group Therapy  04/26/2016 2:31 PM  Type of Therapy:  Group Therapy  Participation Level:  Active  Participation Quality:  Attentive and Sharing  Affect:  Anxious  Cognitive:  Alert and Appropriate  Insight:  Developing/Improving  Engagement in Therapy:  Engaged  Modes of Intervention:  Discussion, Exploration and Limit-setting  Summary of Progress/Problems:  Group consisted of members processing balance in life.  Members were asked to reflect on areas of life that become unbalanced and how having purpose, boundaries, and support increase balance and stability.  Maisie Fushomas was very active and engaged in group sharing how his previous relationship was irratic and difficult to maintain balance.  He reports his job also contributes to unbalanced times in life because of limited time to set boundaries, find time to sleep and eat.    Raye SorrowCoble, Henrique Parekh N 04/26/2016, 2:31 PM

## 2016-04-27 MED ORDER — NICOTINE POLACRILEX 2 MG MT GUM
2.0000 mg | CHEWING_GUM | OROMUCOSAL | Status: DC | PRN
  Administered 2016-04-27 – 2016-04-29 (×7): 2 mg via ORAL

## 2016-04-27 NOTE — Progress Notes (Signed)
D:  Patent's self inventory sheet, patient sleeps good, sleep medication helpful.  Good appetite, high energy level, good concentration.  Rated depression 2, denied hopeless, rated anxiety #4.  Denied withdrawals.  Checked tremors, anxious.  Denied SI.  Physical problems, back hurts, bad bed.  Physical pain, back, worst pain #2.  Goal is return to work.  Plans is to discharge.  [facility]   A:  Medications administered per MD orders.  Emotional support and encouragement given patient. R:  Denied SI and HI, contracts for safety.  Denied A/V hallucinations.  Safety maintained with 15 minute checks.

## 2016-04-27 NOTE — Progress Notes (Signed)
  Adult Psychoeducational Group Note  Date:  04/27/2016 Time:  12:35 PM  Group Topic/Focus:  Relapse Prevention Planning:   The focus of this group is to define relapse and discuss the need for planning to combat relapse.  Participation Level:  Active  Participation Quality:  Appropriate, Attentive, Sharing and Supportive  Affect:  Appropriate  Cognitive:  Appropriate  Insight: Appropriate and Good  Engagement in Group:  Developing/Improving and Engaged  Modes of Intervention:  Discussion, Education, Limit-setting, Orientation, Socialization and Support  Additional Comments: Pt attended group on relapse prevention and recognizing mental health issues and triggers. Discussion of how to overcome relapse and ways to utilize time management becoming healthier.   Karleen HampshireFox, Pedro Whiters Brittini 04/27/2016, 12:35 PM

## 2016-04-27 NOTE — Progress Notes (Signed)
The patient attended this evening's A.A.meeting and was appropriate.  

## 2016-04-27 NOTE — Progress Notes (Signed)
[facility] MD Progress Note  04/27/2016 3:42 PM Alex [name]  MRN:  [id number] Subjective:   31 yo Caucasian male, single, lives with his sister. Gainfully employed. Presented to the emergency room via the police. He was involuntarily committed by the police. His friend called the police after patient sent them electronic messages indicating that he was considering suicide. Patient was armed with a hand gun when the police showed up. His BAL at presentation was 139 mg/dl. His UDS was positive for cocaine, THC and benzodiazepines. Other laboratory parameters were essentially normal except for mild hypokalemia.  Seen today, doing well. Says he is looking forward to being back at work. Denies any cravings for alcohol. Says he is in good spirits. His thoughts are clear. He loves to paint here. He has been getting along well with peers. He does not feel irritated by others. No thoughts of suicide. No thoughts of homicide.  SW spoke with his father today. No concerns about suicidality raised. Family wants him home by Sunday.  Nursing staff reports that he has been appropriate. No behavioral issues. No evidence of mania. No evidence of depression. He has been maintaining normal biological and ego functions.   Principal Problem: Substance induced mood disorder (HCC) Diagnosis:   Patient Active Problem List   Diagnosis Date Noted  . Substance induced mood disorder (HCC) [F19.94] 04/24/2016  . Polysubstance dependence (HCC) [F19.20] 04/23/2016  . 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 Tobacco use disorder [F17.200] 02/01/2014  . ADHD (attention deficit hyperactivity disorder) [F90.9]    Total Time spent with patient: 20 minutes  Past Psychiatric History: See H&P  Past Medical History:  Past Medical History:  Diagnosis Date  . ADHD (attention  deficit hyperactivity disorder)   . Allergy   . Asthma   . Depressed   . ETOH abuse   . History of suicidal tendencies    History reviewed. No pertinent surgical history. Family History:  Family History  Problem Relation Age of Onset  . COPD Other    Family Psychiatric  History: See H&P Social History:  History  Alcohol Use  . Yes    Comment: 5th of vodka daily     History  Drug Use  . Frequency: 6.0 times per week  . Types: Marijuana, Cocaine    Social History   Social History  . Marital status: Single    Spouse name: N/A  . Number of children: N/A  . Years of education: N/A   Social History Main Topics  . Smoking status: Current Every Day Smoker    Packs/day: 1.00    Years: 10.00    Types: Cigarettes  . Smokeless tobacco: Never Used  . Alcohol use Yes     Comment: 5th of vodka daily  . Drug use: Yes    Frequency: 6.0 times per week    Types: Marijuana, Cocaine  . Sexual activity: Not Currently   Other Topics Concern  . None   Social History Narrative  . None   Additional Social History:      Sleep: Good  Appetite:  Good  Current Medications: Current Facility-Administered Medications  Medication Dose Route Frequency Provider Last Rate Last Dose  . acetaminophen (TYLENOL) tablet 650 mg  650 mg Oral Q4H PRN Charm Rings, NP   650 mg at 04/27/16 1303  . albuterol (PROVENTIL HFA;VENTOLIN HFA) 108 (90 Base)  MCG/ACT inhaler 2 puff  2 puff Inhalation Q6H PRN Charm RingsJamison Y Lord, NP   2 puff at 04/27/16 [phone]  . alum & mag hydroxide-simeth (MAALOX/MYLANTA) 200-200-20 MG/5ML suspension 30 mL  30 mL Oral Q4H PRN Charm RingsJamison Y Lord, NP      . hydrocortisone cream 0.5 %   Topical QID PRN Adonis BrookSheila Agustin, NP      . hydrOXYzine (ATARAX/VISTARIL) tablet 50 mg  50 mg Oral Q6H PRN Georgiann CockerVincent A Izediuno, MD   50 mg at 04/27/16 1303  . ibuprofen (ADVIL,MOTRIN) tablet 600 mg  600 mg Oral Q8H PRN Charm RingsJamison Y Lord, NP   600 mg at 04/27/16 0754  . magnesium hydroxide (MILK OF MAGNESIA)  suspension 30 mL  30 mL Oral Daily PRN Charm RingsJamison Y Lord, NP      . naltrexone (DEPADE) tablet 50 mg  50 mg Oral Daily Georgiann CockerVincent A Izediuno, MD   50 mg at 04/27/16 0750  . nicotine (NICODERM CQ - dosed in mg/24 hours) patch 21 mg  21 mg Transdermal Daily Georgiann CockerVincent A Izediuno, MD   21 mg at 04/27/16 [id number]  . ondansetron (ZOFRAN) tablet 4 mg  4 mg Oral Q8H PRN Charm RingsJamison Y Lord, NP      . thiamine (VITAMIN B-1) tablet 100 mg  100 mg Oral Daily Charm RingsJamison Y Lord, NP   100 mg at 04/27/16 0750  . traZODone (DESYREL) tablet 50 mg  50 mg Oral QHS,MR X 1 Kerry HoughSpencer E Simon, PA-C   50 mg at 04/26/16 2223    Lab Results: No results found for this or any previous visit (from the past 48 hour(s)).  Blood Alcohol level:  Lab Results  Component Value Date   ETH 139 (H) 04/23/2016   ETH (H) 06/16/2010    233        LOWEST DETECTABLE LIMIT FOR SERUM ALCOHOL IS 5 mg/dL FOR MEDICAL PURPOSES ONLY    Metabolic Disorder Labs: No results found for: HGBA1C, MPG No results found for: PROLACTIN No results found for: CHOL, TRIG, HDL, CHOLHDL, VLDL, LDLCALC  Physical Findings: AIMS: Facial and Oral Movements Muscles of Facial Expression: None, normal Lips and Perioral Area: None, normal Jaw: None, normal Tongue: None, normal,Extremity Movements Upper (arms, wrists, hands, fingers): None, normal Lower (legs, knees, ankles, toes): None, normal, Trunk Movements Neck, shoulders, hips: None, normal, Overall Severity Severity of abnormal movements (highest score from questions above): None, normal Incapacitation due to abnormal movements: None, normal Patient's awareness of abnormal movements (rate only patient's report): No Awareness, Dental Status Current problems with teeth and/or dentures?: No Does patient usually wear dentures?: No  CIWA:  CIWA-Ar Total: 1 COWS:  COWS Total Score: 1  Musculoskeletal: Strength & Muscle Tone: within normal limits Gait & Station: normal Patient leans: N/A  Psychiatric Specialty  Exam: Physical Exam  ROS  Blood pressure 111/66, pulse 72, temperature 98.1 F (36.7 C), temperature source Oral, resp. rate 16, height 5\' 8"  (1.727 m), weight 59.4 kg (131 lb).Body mass index is 19.92 kg/m.  General Appearance: Calm and cooperative. Good eye contact. He was coloring prior to interview. Appropriate behavior.   Eye Contact:  Good  Speech:  Normal Rate  Volume:  Normal  Mood:  Euthymic  Affect:  Appropriate and Full Range  Thought Process:  Goal Directed  Orientation:  Full (Time, Place, and Person)  Thought Content:  No thoughts of violence. No hallucination in any modality. No delusional theme.   Suicidal Thoughts:  No  Homicidal Thoughts:  No  Memory:  Immediate;   Good Recent;   Good Remote;   Good  Judgement:  Good  Insight:  Good  Psychomotor Activity:  Normal  Concentration:  Concentration: Good and Attention Span: Good  Recall:  Good  Fund of Knowledge:  Good  Language:  Good  Akathisia:  No  Handed:    AIMS (if indicated):     Assets:  Communication Skills Desire for Improvement Financial Resources/Insurance Housing Resilience Social Support  ADL's:  Intact  Cognition:  WNL  Sleep:  Number of Hours: 6     Treatment Plan Summary: Patient is detoxing from alcohol smoothly. He is not a danger to self or others. We are finalizing aftercare.  PLAN: 1. Continue current regimen 2. Continue to monitor mood, behavior and interaction with peers. 3. For discharge on Sunday  Georgiann Cocker, MD 04/27/2016, 3:42 PMPatient ID: Alex [name], male   DOB: 1985-06-26, 30 y.o.   MRN: [id number] Patient ID: Alex [name], male   DOB: 17-Mar-1985, 31 y.o.   MRN: [id number]

## 2016-04-27 NOTE — Tx Team (Signed)
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 acetaminophen (TYLENOL) tablet 650 mg  650 mg Oral Q4H PRN Patrecia Pour, NP      . albuterol (PROVENTIL HFA;VENTOLIN HFA) 108 (90 Base) MCG/ACT inhaler 2 puff  2 puff Inhalation Q6H PRN Patrecia Pour, NP   2 puff at 04/27/16 [phone]  . alum & mag hydroxide-simeth (MAALOX/MYLANTA) 200-200-20 MG/5ML suspension 30 mL  30 mL Oral Q4H PRN Patrecia Pour, NP      . hydrocortisone cream 0.5 %   Topical QID PRN Kerrie Buffalo, NP      . hydrOXYzine (ATARAX/VISTARIL) tablet 50 mg  50 mg Oral Q6H PRN Artist Beach, MD   50 mg at 04/27/16 5009  . ibuprofen (ADVIL,MOTRIN) tablet 600 mg  600 mg Oral Q8H PRN Patrecia Pour, NP   600 mg at 04/27/16 0754  . magnesium hydroxide (MILK OF MAGNESIA) suspension 30 mL  30 mL Oral Daily PRN Patrecia Pour, NP      . naltrexone (DEPADE) tablet 50 mg  50 mg Oral Daily Artist Beach, MD   50 mg at 04/27/16 0750  . nicotine (NICODERM CQ - dosed in mg/24 hours) patch 21 mg  21 mg Transdermal Daily Artist Beach, MD   21 mg at 04/27/16 3818  . ondansetron (ZOFRAN) tablet 4 mg  4 mg Oral Q8H PRN Patrecia Pour, NP      . thiamine (VITAMIN B-1) tablet 100 mg  100 mg Oral Daily Patrecia Pour, NP   100 mg at 04/27/16 0750  . traZODone (DESYREL) tablet 50 mg  50 mg Oral QHS,MR X 1 Laverle Hobby, PA-C   50 mg at 04/26/16 2223   PTA Medications: Prescriptions Prior to Admission  Medication Sig Dispense Refill Last Dose  . albuterol (PROVENTIL HFA;VENTOLIN HFA) 108 (90 Base)  MCG/ACT inhaler Inhale 2 puffs into the lungs every 4 (four) hours as needed for wheezing. 1 Inhaler 0 Past Month at Unknown time  . albuterol (PROVENTIL) (2.5 MG/3ML) 0.083% nebulizer solution Take 3 mLs (2.5 mg total) by nebulization every 6 (six) hours as needed for wheezing or shortness of breath. (Patient not taking: Reported on 04/23/2016) 150 mL 0 Not Taking at Unknown time  . beclomethasone (QVAR) 40 MCG/ACT inhaler Inhale 1 puff into the lungs every morning. (Patient not taking: Reported on 04/23/2016) 1 Inhaler 0 Not Taking at Unknown time  . predniSONE (DELTASONE) 20 MG tablet Take 2 tablets (40 mg total) by mouth daily. (Patient not taking: Reported on 04/23/2016) 10 tablet 0 Not Taking at Unknown time  . varenicline (CHANTIX CONTINUING MONTH PAK) 1 MG tablet Take 1 tablet (1 mg total) by mouth 2 (two) times daily. (Patient not taking: Reported on 04/26/2014) 60 tablet 2 Not Taking at Unknown time  . varenicline (CHANTIX) 0.5 MG tablet As directed (Patient not taking: Reported on 04/26/2014) 53 tablet 0 Not Taking at Unknown time    Patient Stressors: Substance abuse  Patient Strengths: Ability for insight Active sense of humor Average or above average intelligence Capable of independent living Communication skills General fund of knowledge Work  skills  Treatment Modalities: Medication Management, Group therapy, Case management,  1 to 1 session with clinician, Psychoeducation, Recreational therapy.   Physician Treatment Plan for Primary Diagnosis: Substance induced mood disorder ([place]) Long Term Goal(s): Improvement in symptoms so as ready for discharge Improvement in symptoms so as ready for discharge   Short Term Goals: Ability to identify changes in lifestyle to reduce recurrence of condition will improve Ability to verbalize feelings will improve Ability to disclose and discuss suicidal ideas Ability to demonstrate self-control will improve Ability to identify and develop  effective coping behaviors will improve Ability to maintain clinical measurements within normal limits will improve Compliance with prescribed medications will improve Ability to identify triggers associated with substance abuse/mental health issues will improve Ability to identify changes in lifestyle to reduce recurrence of condition will improve Ability to verbalize feelings will improve Ability to disclose and discuss suicidal ideas Ability to demonstrate self-control will improve Ability to identify and develop effective coping behaviors will improve Ability to maintain clinical measurements within normal limits will improve Compliance with prescribed medications will improve Ability to identify triggers associated with substance abuse/mental health issues will improve  Medication Management: Evaluate patient's response, side effects, and tolerance of medication regimen.  Therapeutic Interventions: 1 to 1 sessions, Unit Group sessions and Medication administration.  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[place])  Long Term Goal(s): Improvement in symptoms so as ready for discharge Improvement in symptoms so as ready for discharge   Short Term Goals: Ability to identify changes in lifestyle to reduce recurrence of condition will improve Ability to verbalize feelings will improve Ability to disclose and discuss suicidal ideas Ability to demonstrate self-control will improve Ability to identify and develop effective coping behaviors will improve Ability to maintain clinical measurements within normal limits will improve Compliance with prescribed medications will improve Ability to identify triggers associated with substance abuse/mental health issues will improve Ability to  identify changes in lifestyle to reduce recurrence of condition will improve Ability to verbalize feelings will improve Ability to disclose and discuss suicidal ideas Ability to demonstrate self-control will improve Ability to identify and develop effective coping behaviors will improve Ability to maintain clinical measurements within normal limits will improve Compliance with prescribed medications will improve Ability to identify triggers associated with substance abuse/mental health issues will improve     Medication Management: Evaluate patient's response, side effects, and tolerance of medication regimen.  Therapeutic Interventions: 1 to 1 sessions, Unit Group sessions and Medication administration.  Evaluation of Outcomes: Met   RN Treatment Plan for Primary Diagnosis: Substance induced mood disorder ([place]) Long Term Goal(s): Knowledge of disease and therapeutic regimen to maintain health will improve  Short Term Goals: Ability to remain free from injury will improve and Ability to demonstrate self-control  Medication Management: RN will administer medications as ordered by provider, will assess and evaluate patient's response and provide education to patient for prescribed medication. RN will report any adverse and/or side effects to prescribing provider.  Therapeutic Interventions: 1 on 1 counseling sessions, Psychoeducation, Medication administration, Evaluate responses to treatment, Monitor vital signs and CBGs as ordered, Perform/monitor CIWA, COWS, AIMS and Fall Risk screenings as ordered, Perform wound care treatments as ordered.  Evaluation of Outcomes: Met   LCSW Treatment Plan for Primary Diagnosis: Substance induced mood disorder ([place]) Long Term Goal(s):  Safe transition to appropriate next level of care at discharge, Engage patient in therapeutic group addressing interpersonal concerns.  Short Term Goals: Engage patient in aftercare planning with referrals and  resources  Therapeutic Interventions: Assess for all discharge needs, 1 to 1 time with Social worker, Explore available resources and support systems, Assess for adequacy in community support network, Educate family and significant other(s) on suicide prevention, Complete Psychosocial Assessment, Interpersonal group therapy.  Evaluation of Outcomes: Adequate for discharge   Progress in Treatment: Attending groups: Yes. Participating in groups: Yes. Taking medication as prescribed: Yes. Toleration medication: Yes. Family/Significant other contact made: Pt is in process of providing his father's contact information for SPE. SPE also completed with pt.  Patient understands diagnosis: Yes. Discussing patient identified problems/goals with staff: Yes. Medical problems stabilized or resolved: Yes. Denies suicidal/homicidal ideation: Yes. Issues/concerns per patient self-inventory: Yes. Other:   New problem(s) identified: No, Describe:  none currently  New Short Term/Long Term Goal(s):  Discharge Plan or Barriers:  Pt plans to return home; agreeable to follow-up at [facility]. Pt provided with AA/NA list for [facility] and mental health associates information.   Reason for Continuation of Hospitalization: medication management' Mood stabilization   Estimated Length of Stay: 2 days (pt scheduled for discharge on Sunday per Dr. Sanjuana Letters).   Attendees: Patient: 04/27/2016 11:28 AM  Physician: Dr. Sanjuana Letters MD 04/27/2016 11:28 AM  Nursing: Rise Paganini RN; [facility] RN 04/27/2016 11:28 AM  RN Care Manager: Anderson Malta, RN CM 04/27/2016 11:28 AM  Social Worker:  Maxie Better, LCSW; Adriana Reams LCSW 04/27/2016 11:28 AM  Recreational Therapist: Rhunette Croft 04/27/2016 11:28 AM  Other: Lindell Spar NP; Ricky Ala NP 04/27/2016 11:28 AM  Other:  04/27/2016 11:28 AM  Other: 04/27/2016 11:28 AM    Scribe for Treatment Team: Leon, LCSW 04/27/2016 11:28 AM

## 2016-04-27 NOTE — Progress Notes (Signed)
Recreation Therapy Notes  Date: 04/27/16 Time: 0930 Location: 300 [address] Dayroom  Group Topic: Coping Skills  Goal Area(s) Addresses:  Patients will be able to identify positive stress management techniques. Patients will be able to identify the benefits of stress management. Patients will be able to identify benefits of using stress management post d/c.  Behavioral Response: Engaged  Intervention: Stress Management  Activity: Progressive Muscle Relaxation.  LRT introduced the stress management technique of progressive muscle relaxation.  LRT read a script to lead the patients through the technique to tense and relax each muscle group individually.  Patients were to follow along as the script was read to engage in the activity,   Education: Coping Skills, Discharge Planning.   Education Outcome: Acknowledges understanding/In group clarification offered/Needs additional education.   Clinical Observations/Feedback: Pt attended group.    Caroll RancherMarjette Reesa Gotschall, LRT/CTRS         Caroll RancherLindsay, Idamay Hosein A 04/27/2016 11:37 AM

## 2016-04-27 NOTE — Progress Notes (Signed)
Following breakfast, the patient became upset in the hallway while his peers were in the process of getting agitated and he stated that he would rip the CIRT button off the hall. He also complained that he was being held illegally in the hospital. The patient was encouraged to calm down and to talk to the staff as an alternative. In addition, he along with a male peer became argumentative with a staff member in the cafeteria after being encouraged to finish his meal and return to the unit.

## 2016-04-27 NOTE — [facility] Suicide Risk Assessment (Signed)
[facility] INPATIENT:  Family/Significant Other Suicide Prevention Education  Suicide Prevention Education:  Education Completed; Alex [name] (pt's father) [phone] has been identified by the patient as the family member/significant other with whom the patient will be residing, and identified as the person(s) who will aid the patient in the event of a mental health crisis (suicidal ideations/suicide attempt).  With written consent from the patient, the family member/significant other has been provided the following suicide prevention education, prior to the and/or following the discharge of the patient.  The suicide prevention education provided includes the following:  Suicide risk factors  Suicide prevention and interventions  National Suicide Hotline telephone number  [facility] telephone number  [facility] 911  [facility] and/or Residential Mobile Crisis Unit telephone number  Request made of family/significant other to:  Remove weapons (e.g., guns, rifles, knives), all items previously/currently identified as safety concern.    Remove drugs/medications (over-the-counter, prescriptions, illicit drugs), all items previously/currently identified as a safety concern.  The family member/significant other verbalizes understanding of the suicide prevention education information provided.  The family member/significant other agrees to remove the items of safety concern listed above.  Pt's father has no concerns regarding pt discharging. 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

## 2016-04-27 NOTE — [facility] Group Notes (Signed)
[facility] LCSW Group Therapy  04/27/2016 1:15pm  Type of Therapy: Group Therapy   Topic: Overcoming Obstacles  Participation Level: Pt invited. Did not attend.  04/27/16 2:40 PM Jonathon JordanLynn B Patrycja Mumpower, MSW, LCSWA

## 2016-04-27 NOTE — Progress Notes (Signed)
Pt sitting in cafeteria with 6 people at the table. Staff asked pt to return to unit. Pt said no he has more time. Staff explain when eatting you do but talking and children are coming. Adult pt cannot be on the hallway when children there. Pt said again he has more time. Staff call security for help. Security came to cafeteria. The charge RN  was notify. 

## 2016-04-27 NOTE — Progress Notes (Signed)
Pt reports his day has been better than yesterday.  He seems to have a brighter mood tonight as compared to last night.  He states his anxiety level has decreased and he feels more hopeful.  He reports that his withdrawal symptoms are improving.  He denies SI/HI/AVH at this time.  He has spent most of the evening in the dayroom talking and interacting with peers.  He plans to discharge home after detox.  He says that he spoke to his employer and he still has his job.  That has made him feel more goal oriented.  Support and encouragement offered.  Discharge plans are in process.  Safety maintained with q15 minute checks.

## 2016-04-27 NOTE — Plan of Care (Signed)
Problem: Education: Goal: Ability to make informed decisions regarding treatment will improve Outcome: Progressing Nurse discussed depression/coping skills with patient.   

## 2016-04-28 MED ORDER — IBUPROFEN 600 MG PO TABS
600.0000 mg | ORAL_TABLET | Freq: Three times a day (TID) | ORAL | Status: DC | PRN
  Administered 2016-04-28: 600 mg via ORAL
  Filled 2016-04-28: qty 1

## 2016-04-28 NOTE — Progress Notes (Signed)
DAR Note: Alex [name] has been visible on the unit.  He denies SI/HI or A/V hallucinations.  He reported that today he is really tired and wanting to sleep.  He did state that he has been having "crazy" dreams and it feels like he is "dreaming" in his dreams.  He complete his self inventory and reported that his depression is 1/10 and his anxiety and hopelessness are 0/10.  He reported that his goal for today is "sleep" and he will accomplish this goal by "sleeping more."  Encouraged continued participation in group and unit activities.  Q 15 minute checks maintained for safety.  We will continue to monitor the progress towards  his goals.

## 2016-04-28 NOTE — Progress Notes (Signed)
Pt reports he had a good day and is looking forward to being discharged sometime this weekend.  He denies SI/HI/AVH.  He states his withdrawal symptoms are tolerable and minimal.  He said that he has been having trouble sleeping, so writer reminded him that he had a repeat dose of the Trazodone 50 mg if needed, so the plan is for him to receive both doses so hopefully he will have a better night.  He has been pleasant and cooperative with staff, but MHT reports he is can be a little sarcastic with peers, causing some issues with other patients.  There has been no major incident involving the patient, though.  Pt makes his needs and concerns known to staff.  Support and encouragement offered.  Discharge plans are in process, and pt plans to return home and return to his job.  Safety maintained with q15 minute checks.

## 2016-04-28 NOTE — Progress Notes (Signed)
Pt attended AA group this evening.  

## 2016-04-28 NOTE — Progress Notes (Signed)
D.  Pt pleasant on approach, denies complaints at this time.  Pt was positive for evening AA group, interacting appropriately with peers on the unit.  Pt denies SI/HI/hallucinations at this time.  A.  Support and encouragement offered, medication given as ordered.  R.  Pt remains safe on the unit, will continue to monitor.

## 2016-04-28 NOTE — Progress Notes (Signed)
[facility] MD Progress Note  04/28/2016 3:22 PM Alex [name]  MRN:  [id number] Subjective:   31 yo Caucasian male, single, lives with his sister. Gainfully employed. Presented to the emergency room via the police. He was involuntarily committed by the police. His friend called the police after patient sent them electronic messages indicating that he was considering suicide. Patient was armed with a hand gun when the police showed up. His BAL at presentation was 139 mg/dl. His UDS was positive for cocaine, THC and benzodiazepines. Other laboratory parameters were essentially normal except for mild hypokalemia.  Seen today. In good spirits. Looking forward to discharge tomorrow. Says he just spoke with his father. His dad would be picking him up tomorrow. Patient feels good. Not feeling depressed. Not anxious. He is able to think clearly. No racing thoughts. No evidence of psychosis.   Nursing staff reports that he has been appropriate. No behavioral issues. He has not expressed any thoughts of violence. He has not been observed to be internally preoccupied.   Principal Problem: Substance induced mood disorder (HCC) Diagnosis:   Patient Active Problem List   Diagnosis Date Noted  . Substance induced mood disorder (HCC) [F19.94] 04/24/2016  . Polysubstance dependence (HCC) [F19.20] 04/23/2016  . 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 Tobacco use disorder [F17.200] 02/01/2014  . ADHD (attention deficit hyperactivity disorder) [F90.9]    Total Time spent with patient: 20 minutes  Past Psychiatric History: See H&P  Past Medical History:  Past Medical History:  Diagnosis Date  . ADHD (attention deficit hyperactivity disorder)   . Allergy   . Asthma   . Depressed   . ETOH abuse   . History of suicidal tendencies    History reviewed. No  pertinent surgical history. Family History:  Family History  Problem Relation Age of Onset  . COPD Other    Family Psychiatric  History: See H&P Social History:  History  Alcohol Use  . Yes    Comment: 5th of vodka daily     History  Drug Use  . Frequency: 6.0 times per week  . Types: Marijuana, Cocaine    Social History   Social History  . Marital status: Single    Spouse name: N/A  . Number of children: N/A  . Years of education: N/A   Social History Main Topics  . Smoking status: Current Every Day Smoker    Packs/day: 1.00    Years: 10.00    Types: Cigarettes  . Smokeless tobacco: Never Used  . Alcohol use Yes     Comment: 5th of vodka daily  . Drug use: Yes    Frequency: 6.0 times per week    Types: Marijuana, Cocaine  . Sexual activity: Not Currently   Other Topics Concern  . None   Social History Narrative  . None   Additional Social History:      Sleep: Good  Appetite:  Good  Current Medications: Current Facility-Administered Medications  Medication Dose Route Frequency Provider Last Rate Last Dose  . acetaminophen (TYLENOL) tablet 650 mg  650 mg Oral Q4H PRN Charm Rings, NP   650 mg at 04/27/16 1303  . albuterol (PROVENTIL HFA;VENTOLIN HFA) 108 (90 Base) MCG/ACT inhaler 2 puff  2 puff Inhalation Q6H PRN Charm Rings, NP   2 puff at 04/28/16 [phone]  . alum & mag hydroxide-simeth (MAALOX/MYLANTA)  200-200-20 MG/5ML suspension 30 mL  30 mL Oral Q4H PRN Charm Rings, NP      . hydrocortisone cream 0.5 %   Topical QID PRN Adonis Brook, NP      . hydrOXYzine (ATARAX/VISTARIL) tablet 50 mg  50 mg Oral Q6H PRN Georgiann Cocker, MD   50 mg at 04/28/16 2956  . ibuprofen (ADVIL,MOTRIN) tablet 600 mg  600 mg Oral Q8H PRN Oneta Rack, NP      . magnesium hydroxide (MILK OF MAGNESIA) suspension 30 mL  30 mL Oral Daily PRN Charm Rings, NP      . naltrexone (DEPADE) tablet 50 mg  50 mg Oral Daily Georgiann Cocker, MD   50 mg at 04/28/16 0811  .  nicotine polacrilex (NICORETTE) gum 2 mg  2 mg Oral PRN Craige Cotta, MD   2 mg at 04/28/16 1257  . ondansetron (ZOFRAN) tablet 4 mg  4 mg Oral Q8H PRN Charm Rings, NP      . thiamine (VITAMIN B-1) tablet 100 mg  100 mg Oral Daily Charm Rings, NP   100 mg at 04/28/16 2130  . traZODone (DESYREL) tablet 50 mg  50 mg Oral QHS,MR X 1 Kerry Hough, PA-C   50 mg at 04/27/16 2242    Lab Results: No results found for this or any previous visit (from the past 48 hour(s)).  Blood Alcohol level:  Lab Results  Component Value Date   ETH 139 (H) 04/23/2016   ETH (H) 06/16/2010    233        LOWEST DETECTABLE LIMIT FOR SERUM ALCOHOL IS 5 mg/dL FOR MEDICAL PURPOSES ONLY    Metabolic Disorder Labs: No results found for: HGBA1C, MPG No results found for: PROLACTIN No results found for: CHOL, TRIG, HDL, CHOLHDL, VLDL, LDLCALC  Physical Findings: AIMS: Facial and Oral Movements Muscles of Facial Expression: None, normal Lips and Perioral Area: None, normal Jaw: None, normal Tongue: None, normal,Extremity Movements Upper (arms, wrists, hands, fingers): None, normal Lower (legs, knees, ankles, toes): None, normal, Trunk Movements Neck, shoulders, hips: None, normal, Overall Severity Severity of abnormal movements (highest score from questions above): None, normal Incapacitation due to abnormal movements: None, normal Patient's awareness of abnormal movements (rate only patient's report): No Awareness, Dental Status Current problems with teeth and/or dentures?: No Does patient usually wear dentures?: No  CIWA:  CIWA-Ar Total: 0 COWS:  COWS Total Score: 1  Musculoskeletal: Strength & Muscle Tone: within normal limits Gait & Station: normal Patient leans: N/A  Psychiatric Specialty Exam: Physical Exam  ROS  Blood pressure 125/88, pulse 70, temperature 97.8 F (36.6 C), resp. rate 16, height 5\' 8"  (1.727 m), weight 59.4 kg (131 lb).Body mass index is 19.92 kg/m.  General  Appearance: Neatly dressed, pleasant, engaging well and cooperative. Appropriate behavior. Not in any distress. Good relatedness. Not internally stimulated  Eye Contact:  Good  Speech:  Spontaneous, normal prosody. Normal tone and rate.   Volume:  Normal  Mood:  Euthymic  Affect:  Appropriate and Full Range  Thought Process:  Goal Directed  Orientation:  Full (Time, Place, and Person)  Thought Content:  No delusional theme. No preoccupation with violent thoughts. No negative ruminations. No obsession.  No hallucination in any modality.   Suicidal Thoughts:  No  Homicidal Thoughts:  No  Memory:  Immediate;   Good Recent;   Good Remote;   Good  Judgement:  Good  Insight:  Good  Psychomotor Activity:  Normal  Concentration:  Concentration: Good and Attention Span: Good  Recall:  Good  Fund of Knowledge:  Good  Language:  Good  Akathisia:  No  Handed:    AIMS (if indicated):     Assets:  Communication Skills Desire for Improvement Financial Resources/Insurance Housing Resilience Social Support  ADL's:  Intact  Cognition:  WNL  Sleep:  Number of Hours: 6.25     Treatment Plan Summary: Patient has fully come off alcohol. He is not depressed. He is not psychotic or manic. No anxiety. He is scheduled for discharge tomorrow.   PLAN: 1. Continue current regimen 2. Continue to monitor mood, behavior and interaction with peers. 3. Discharge tomorrow.   Georgiann CockerVincent A Ameilia Rattan, MD 04/28/2016, 3:22 PMPatient ID: Alex [name], male   DOB: 04-03-1985, 30 y.o.   MRN: [id number] Patient ID: Alex [name], male   DOB: 04-03-1985, 31 y.o.   MRN: [id number] Patient ID: Alex [name], male   DOB: 04-03-1985, 31 y.o.   MRN: [id number]

## 2016-04-28 NOTE — [facility] Group Notes (Signed)
[facility] LCSW Group Therapy Note  04/28/2016 at 10:10 to 11:05 AM  Type of Therapy and Topic:  Group Therapy: Motivation   Participation Level:  Active  Participation Quality:  Attentive and Sharing  Affect:  Appropriate  Cognitive:  Appropriate  Insight:  Developing/Improving  Engagement in Therapy:  Engaged   Therapeutic models used Cognitive Behavioral Therapy Person-Centered Therapy Motivational Interviewing   Summary of Patient Progress: The main focus of today's process group was to discuss patient motivation to change. Motivational Interviewing was used to initiate discussion with group and individuals were allowed to process to their resistance. Alex [name] uses his intellegence and satire to deflect attention from himself and yet was able to honestly answer questions when challenged.   Carney Bernatherine C Azelia Reiger, LCSW

## 2016-04-28 NOTE — [facility] Group Notes (Signed)
[facility] Group Notes:  (Nursing/MHT/Case Management/Adjunct)  Date:  04/28/2016  Time:  3:27 PM  Type of Therapy:  Nurse Education  Participation Level:  Active  Participation Quality:  Appropriate  Affect:  Blunted and Flat  Cognitive:  Alert, Appropriate and Oriented  Insight:  Lacking  Engagement in Group:  Engaged  Modes of Intervention:  Discussion and Support  Summary of Progress/Problems: Patient came in toward the end of group. Talked about his lack of emotions in distressing situations, self diagnosed as a sociopath.  Vinetta BergamoBarbara M Holland Kotter 04/28/2016, 3:27 PM

## 2016-04-29 MED ORDER — ALBUTEROL SULFATE HFA 108 (90 BASE) MCG/ACT IN AERS: 2.0000 | INHALATION_SPRAY | Freq: Four times a day (QID) | RESPIRATORY_TRACT | 1 refills | Status: DC | PRN

## 2016-04-29 MED ORDER — HYDROXYZINE HCL 50 MG PO TABS: 50.0000 mg | ORAL_TABLET | Freq: Four times a day (QID) | ORAL | 0 refills | Status: DC | PRN

## 2016-04-29 MED ORDER — NICOTINE POLACRILEX 2 MG MT GUM: 2.0000 mg | CHEWING_GUM | OROMUCOSAL | 0 refills | Status: DC | PRN

## 2016-04-29 MED ORDER — HYDROCORTISONE 0.5 % EX CREA: TOPICAL_CREAM | Freq: Four times a day (QID) | CUTANEOUS | 0 refills | Status: DC | PRN

## 2016-04-29 MED ORDER — TRAZODONE HCL 50 MG PO TABS: 50.0000 mg | ORAL_TABLET | Freq: Every evening | ORAL | 0 refills | Status: DC | PRN

## 2016-04-29 MED ORDER — NALTREXONE HCL 50 MG PO TABS: 50.0000 mg | ORAL_TABLET | Freq: Every day | ORAL | 0 refills | Status: DC

## 2016-04-29 NOTE — Discharge Summary (Signed)
Physician Discharge Summary Note  Patient:  Alex [name] is an 31 y.o., male MRN:  [id number] DOB:  [birth year] Patient phone:  [phone] (home)  Patient address:   [address] [place] [address],  Total Time spent with patient: 30 minutes  Date of Admission:  04/23/2016 Date of Discharge: 04/30/2016  Reason for Admission:PER H&P-  31 yo Caucasian male, single, lives with his sister. Gainfully employed. Presented to the emergency room via the police. He was involuntarily committed by the police. His friend called the police after patient sent them electronic messages indicating that he was considering suicide. Patient was armed with a hand gun when the police showed up. His BAL at presentation was 139 mg/dl. His UDS was positive for cocaine, THC and benzodiazepines. Other laboratory parameters were essentially normal except for mild hypokalemia.At interview, patient reports that he has a long history of SUD. Says he stayed sober for five years. He relapsed about a year ago. Patient reports daily use of alcohol. Says he uses cocaine occasionally. States that end of his relationship with Alex [name] was the trigger.  " I regret the things I did in my past ,,,, it drives me to drink ,,,, I rather feel numb than guilty ,,,, I like the feeling of getting high ,,,,, I have fun when I am high ,,, I am not depressed and then drink,,,,,, rather I get depressed and anxious after I have used ,,,,, I felt well and normal for the five years I did not use ,,,, I have never attempted suicide in my life ,,,,,, nothing so bad in my life ,,,,,, when I drink I say stupid things ,,,, I carry a gun on me all the time ,,,,, The neighborhood I live in makes me carry" Patient states that he was intoxicated when he sent the text messages. Says he never meant to kill himself. Says he can understand why his friends read it the other way. Patient states that he has not really gotten over the end of his relationship with  Alex [name]. Says she took his cat and left him. Patient states that he has a son whom he gave up all rights on. Says when sober, he sometimes feels guilty as he is not sure how his son is faring in foster care. Patient reports he does not have any suicidal thoughts at this time. Says he wants to work on his addiction. He has had withdrawal seizures in the past. He typically gets blackouts when drunk. No associated hallucinations. No associated delusions. No passivity phenomena. Currently he does not feel depressed. He notes that his sleep wake cycle varies with drug use. He has normal appetite. Patient states that he is able to think clearly now. No racing thoughts. He feels calm and not anxious. He denies any other stressors at this time.  Principal Problem: Substance induced mood disorder [facility]) Discharge Diagnoses: Patient Active Problem List   Diagnosis Date Noted  . Substance induced mood disorder (HCC) [F19.94] 04/24/2016  . Polysubstance dependence (HCC) [F19.20] 04/23/2016  . 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 Tobacco use disorder [F17.200] 02/01/2014  . ADHD (attention deficit hyperactivity disorder) [F90.9]     Past Psychiatric History:   Past Medical History:  Past Medical History:  Diagnosis Date  . ADHD (attention deficit hyperactivity disorder)   . Allergy   . Asthma   . Depressed   .  ETOH abuse   . History of suicidal tendencies    History reviewed. No pertinent surgical history. Family History:  Family History  Problem Relation Age of Onset  . COPD Other    Family Psychiatric  History:  Social History:  History  Alcohol Use  . Yes    Comment: 5th of vodka daily     History  Drug Use  . Frequency: 6.0 times per week  . Types: Marijuana, Cocaine    Social History   Social History  . Marital status:  Single    Spouse name: N/A  . Number of children: N/A  . Years of education: N/A   Social History Main Topics  . Smoking status: Current Every Day Smoker    Packs/day: 1.00    Years: 10.00    Types: Cigarettes  . Smokeless tobacco: Never Used  . Alcohol use Yes     Comment: 5th of vodka daily  . Drug use: Yes    Frequency: 6.0 times per week    Types: Marijuana, Cocaine  . Sexual activity: Not Currently   Other Topics Concern  . None   Social History Narrative  . None    Hospital Course:  Alex [name] was admitted for Substance induced mood disorder [facility])  and crisis management.  Pt was treated discharged with the medications listed below under Medication List.  Medical problems were identified and treated as needed.  Home medications were restarted as appropriate.  Improvement was monitored by observation and Alex [name] 's daily report of symptom reduction.  Emotional and mental status was monitored by daily self-inventory reports completed by Alex [name] and clinical staff.         Alex [name] was evaluated by the treatment team for stability and plans for continued recovery upon discharge. Alex [name] 's motivation was an integral factor for scheduling further treatment. Employment, transportation, bed availability, health status, family support, and any pending legal issues were also considered during hospital stay. Pt was offered further treatment options upon discharge including but not limited to Residential, Intensive Outpatient, and Outpatient treatment.  Alex [name] will follow up with the services as listed below under Follow Up Information.     Upon completion of this admission the patient was both mentally and medically stable for discharge denying suicidal/homicidal ideation, auditory/visual/tactile hallucinations, delusional thoughts and paranoia.   Alex [name] responded well to treatment with Depade 50 mg and trazodone 50mg  without adverse  effects. Pt demonstrated improvement without reported or observed adverse effects to the point of stability appropriate for outpatient management. Pertinent labs include: CMP, for which outpatient follow-up is necessary for lab recheck as mentioned below. Reviewed CBC, CMP, BAL+ 139 on admission, and UDS + Cocaine, benzodiazepines and THC; all unremarkable aside from noted exceptions.   Physical Findings: AIMS: Facial and Oral Movements Muscles of Facial Expression: None, normal Lips and Perioral Area: None, normal Jaw: None, normal Tongue: None, normal,Extremity Movements Upper (arms, wrists, hands, fingers): None, normal Lower (legs, knees, ankles, toes): None, normal, Trunk Movements Neck, shoulders, hips: None, normal, Overall Severity Severity of abnormal movements (highest score from questions above): None, normal Incapacitation due to abnormal movements: None, normal Patient's awareness of abnormal movements (rate only patient's report): No Awareness, Dental Status Current problems with teeth and/or dentures?: No Does patient usually wear dentures?: No  CIWA:  CIWA-Ar Total: 0 COWS:  COWS Total Score: 1  Musculoskeletal: Strength & Muscle Tone: within normal limits Gait &  Station: normal Patient leans: N/A  Psychiatric Specialty Exam: See Sra BY MD  Physical Exam  Nursing note and vitals reviewed. Constitutional: He is oriented to person, place, and time. He appears well-developed.  Neurological: He is alert and oriented to person, place, and time.  Psychiatric: He has a normal mood and affect. His behavior is normal.    Review of Systems  Psychiatric/Behavioral: Negative for depression (stable). The patient is not nervous/anxious (stable).     Blood pressure (!) 118/92, pulse 88, temperature 97.6 F (36.4 C), temperature source Oral, resp. rate 16, height 5\' 8"  (1.727 m), weight 59.4 kg (131 lb).Body mass index is 19.92 kg/m.    Have you used any form of tobacco in the  last 30 days? (Cigarettes, Smokeless Tobacco, Cigars, and/or Pipes): Yes  Has this patient used any form of tobacco in the last 30 days? (Cigarettes, Smokeless Tobacco, Cigars, and/or Pipes) , Yes, A prescription for an FDA-approved tobacco cessation medication was offered at discharge and the patient refused  Blood Alcohol level:  Lab Results  Component Value Date   ETH 139 (H) 04/23/2016   ETH (H) 06/16/2010    233        LOWEST DETECTABLE LIMIT FOR SERUM ALCOHOL IS 5 mg/dL FOR MEDICAL PURPOSES ONLY    Metabolic Disorder Labs:  No results found for: HGBA1C, MPG No results found for: PROLACTIN No results found for: CHOL, TRIG, HDL, CHOLHDL, VLDL, LDLCALC  See Psychiatric Specialty Exam and Suicide Risk Assessment completed by Attending Physician prior to discharge.  Discharge destination:  Home  Is patient on multiple antipsychotic therapies at discharge:  No   Has Patient had three or more failed trials of antipsychotic monotherapy by history:  No  Recommended Plan for Multiple Antipsychotic Therapies: NA   Allergies as of 04/29/2016   No Known Allergies     Medication List    STOP taking these medications   beclomethasone 40 MCG/ACT inhaler Commonly known as:  QVAR   predniSONE 20 MG tablet Commonly known as:  DELTASONE   varenicline 0.5 MG tablet Commonly known as:  CHANTIX   varenicline 1 MG tablet Commonly known as:  CHANTIX CONTINUING MONTH PAK     TAKE these medications     Indication  albuterol (2.5 MG/3ML) 0.083% nebulizer solution Commonly known as:  PROVENTIL Take 3 mLs (2.5 mg total) by nebulization every 6 (six) hours as needed for wheezing or shortness of breath. What changed:  Another medication with the same name was changed. Make sure you understand how and when to take each.  Indication:  Acute Bronchospastic Disease   albuterol 108 (90 Base) MCG/ACT inhaler Commonly known as:  PROVENTIL HFA;VENTOLIN HFA Inhale 2 puffs into the lungs every  6 (six) hours as needed for wheezing or shortness of breath. What changed:  when to take this  reasons to take this  Indication:  Asthma   hydrocortisone cream 0.5 % Apply topically 4 (four) times daily as needed for itching.  Indication:  Itching   hydrOXYzine 50 MG tablet Commonly known as:  ATARAX/VISTARIL Take 1 tablet (50 mg total) by mouth every 6 (six) hours as needed for anxiety.  Indication:  Anxiety Neurosis   naltrexone 50 MG tablet Commonly known as:  DEPADE Take 1 tablet (50 mg total) by mouth daily. Start taking on:  04/30/2016  Indication:  Excessive Use of Alcohol   nicotine polacrilex 2 MG gum Commonly known as:  NICORETTE Take 1 each (2 mg total) by  mouth as needed for smoking cessation.  Indication:  Nicotine Addiction   traZODone 50 MG tablet Commonly known as:  DESYREL Take 1 tablet (50 mg total) by mouth at bedtime and may repeat dose one time if needed.  Indication:  Aggressive Behavior      Follow-up Information    [facility] Follow up.   Specialty:  Behavioral Health Why:  Walk in between 8am-9am Monday through Friday for hospital follow-up/medication management/assessment for counseling services. Please go within 7 days of hospital discharge.  [address] [place] [address] [phone]           Follow-up recommendations:  Activity:  as tolerated Diet:  heart healthy  Comments:  Take all medications as prescribed. Keep all follow-up appointments as scheduled.  Do not consume alcohol or use illegal drugs while on prescription medications. Report any adverse effects from your medications to your primary care provider promptly.  In the event of recurrent symptoms or worsening symptoms, call 911, a crisis hotline, or go to the nearest emergency department for evaluation.   Signed: Oneta Rack, NP 04/29/2016, 8:57 AM

## 2016-04-29 NOTE — BHH Suicide Risk Assessment (Signed)
[facility]   Principal Problem: Substance induced mood disorder [facility](HCC) Discharge Diagnoses: Alcohol Use Disorder Patient Active Problem List   Diagnosis Date Noted  . Substance induced mood disorder (HCC) [F19.94] 04/24/2016  . Polysubstance dependence (HCC) [F19.20] 04/23/2016  . 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 Tobacco use disorder [F17.200] 02/01/2014  . ADHD (attention deficit hyperactivity disorder) [F90.9]     Total Time spent with patient: 30 minutes  Musculoskeletal: Strength & Muscle Tone: within normal limits Gait & Station: normal Patient leans: N/A  Psychiatric Specialty Exam: Review of Systems  Constitutional: Negative.   HENT: Negative.   Eyes: Negative.   Respiratory: Negative.   Cardiovascular: Negative.   Gastrointestinal: Negative.   Genitourinary: Negative.   Musculoskeletal: Negative.   Skin: Negative.   Neurological: Negative.   Endo/Heme/Allergies: Negative.   Psychiatric/Behavioral: Negative for depression, hallucinations, memory loss and suicidal ideas. The patient is not nervous/anxious and does not have insomnia.     Blood pressure (!) 118/92, pulse 88, temperature 97.6 F (36.4 C), temperature source Oral, resp. rate 16, height 5\' 8"  (1.727 m), weight 59.4 kg (131 lb).Body mass index is 19.92 kg/m.  General Appearance: Neatly dressed, pleasant, engaging well and cooperative. Appropriate behavior. Not in any distress. Good relatedness. Not internally stimulated. No residual withdrawal symptoms.   Eye Contact: Good  Speech: Spontaneous, normal prosody. Normal tone and rate.   Volume:  Normal  Mood:  Euthymic  Affect:  Appropriate and Full Range  Thought Process:  Linear, logical and well organized  Orientation:  Full (Time, Place, and Person)  Thought  Content:  No delusional theme. No preoccupation with violent thoughts. No negative ruminations. No obsession.  No hallucination in any modality.   Suicidal Thoughts:  No  Homicidal Thoughts:  No  Memory:  Immediate;   Good Recent;   Good Remote;   Good  Judgement:  Good  Insight:  Good  Psychomotor Activity:  Normal  Concentration:  Good  Recall:  Good  Fund of Knowledge:Good  Language: Good  Akathisia:  No  Handed:    AIMS (if indicated):     Assets:  Communication Skills Desire for Improvement Financial Resources/Insurance Housing Physical Health Resilience Social Support Talents/Skills Transportation  Sleep:  Number of Hours: 6  Cognition: WNL  ADL's:  Intact   Clinical  Assessment::   On Admission:  Self-harm behaviors 31 yo Caucasian male, single, lives with his sister. Gainfully employed. Presented to the emergency room via the police. He was involuntarily committed by the police. His friend called the police after patient sent them electronic messages indicating that he was considering suicide. Patient was armed with a hand gun when the police showed up. His BAL at presentation was 139 mg/dl. His UDS was positive for cocaine, THC and benzodiazepines. Other laboratory parameters were essentially normal except for mild hypokalemia.  Seen today. Doing well. No evidence of depression. No evidence of anxiety. No thoughts of suicide. No thoughts of homicide. No thoughts of violence.  Patient is looking forward to getting back to work. He reports thinking clearly. No perceptual abnormalities.  Nursing staff reports that patient has been appropriate on the unit. Patient has been interacting well with peers. No behavioral issues. Patient has not voiced any suicidal thoughts. Patient has not been observed to be internally stimulated. Patient has been adherent with treatment recommendations. Patient has  been tolerating their medication well.   Team agrees that he is back to his  baseline. Team agrees on discharge today.   Demographic Factors:  Male and Caucasian  Loss Factors: NA  Historical Factors: Impulsivity  Risk Reduction Factors:   Sense of responsibility to family, Employed, Living with another person, especially a relative, Positive social support, Positive therapeutic relationship and Positive coping skills or problem solving skills  Continued Clinical Symptoms:   Symptoms have completely resolved.   Cognitive Features That Contribute To Risk:  None    Suicide Risk:  Minimal: No identifiable suicidal ideation.   Patient is not having any thoughts of suicide at this time. Modifiable risk factors targeted during this admission includes alcohol use disorder and related mood symptoms. Symptoms are completely resolved.  Demographical and historical risk factors cannot be modified. Patient is now engaging well. Patient is reliable and is future oriented. We have buffered patient's support structures. At this point, patient is at low risk of suicide. Patient is aware of the effects of psychoactive substances on decision making process. Patient has been provided with emergency contacts. Patient acknowledges to use resources provided if unforseen circumstances changes their current risk stratification.    Follow-up Information    [facility] up.   Specialty:  Behavioral Health Why:  Walk in between 8am-9am Monday through Friday for hospital follow-up/medication management/assessment for counseling services. Please go within 7 days of hospital discharge.  Contact information: [address] [place] [address] [phone]           Plan Of Care/Follow-up recommendations:  1. Continue current psychotropic medications 2. Mental health and addiction follow up as arranged.  3. Discharge in care of their family    Georgiann Cocker, MD 04/29/2016, 9:19 AM

## 2016-04-29 NOTE — Progress Notes (Signed)
D: Alex [name] denies SI, HI, and AVH. He verbalizes no complaints other than mild anxiety, for which he requested and received PRN Vistaril. He also requested and received nicotine gum. Pt says he's ready for discharge. He's been visible in milieu and has been interacting with peers. No concerns or needs voiced.   A: Meds given as ordered. Q15 safety checks maintained. Support/encouragement offered.  R: Pt remains free from harm and continues with treatment. Will continue to monitor for needs/safety.

## 2016-04-29 NOTE — Progress Notes (Signed)
  [facility]  Will you be returning to the same living situation after discharge:  Yes,  back with sister At discharge, do you have transportation home?: Yes,  father Do you have the ability to pay for your medications: No.  Release of information consent forms completed and turned in to Medical Records  Patient to Follow up at: Follow-up Information    [facility].   Specialty:  Behavioral Health Why:  Walk in between 8am-9am Monday through Friday for hospital follow-up/medication management/assessment for counseling services. Please go within 7 days of hospital discharge.  Contact information: [address] [place] [id number] [phone]           Next level of care provider [facility]:no  Safety Planning and Suicide Prevention discussed: Yes,  with patient and family member  Have you used any form of tobacco in the last 30 days? (Cigarettes, Smokeless Tobacco, Cigars, and/or Pipes): Yes  Has patient been referred to the Quitline?: Patient refused referral  Patient has been referred for addiction treatment: Yes  Lynnell ChadMareida J Grossman-Orr 04/29/2016, 8:16 AM

## 2016-04-29 NOTE — Progress Notes (Signed)
Patient was discharged per order. AVS, SRA, scripts, and transition summary were all reviewed with patient. Pt was given an opportunity to ask questions and verbalized understanding of all discharge paperwork. Belongings were returned, and patient signed for receipt. Patient verbalized readiness for discharge and appeared in no acute distress when escorted to lobby where parent awaited.

## 2016-07-03 ENCOUNTER — Emergency Department (HOSPITAL_COMMUNITY)
Admission: EM | Admit: 2016-07-03 | Discharge: 2016-07-03 | Disposition: A | Discharge status: Home / Self Care | Payer: BLUE CROSS/BLUE SHIELD | Attending: Physician Assistant | Admitting: Physician Assistant

## 2016-07-03 ENCOUNTER — Encounter (HOSPITAL_COMMUNITY): Payer: Self-pay

## 2016-07-03 DIAGNOSIS — Z79899 Other long term (current) drug therapy: Secondary | ICD-10-CM | POA: Insufficient documentation

## 2016-07-03 DIAGNOSIS — F1910 Other psychoactive substance abuse, uncomplicated: Secondary | ICD-10-CM

## 2016-07-03 DIAGNOSIS — F17210 Nicotine dependence, cigarettes, uncomplicated: Secondary | ICD-10-CM | POA: Insufficient documentation

## 2016-07-03 DIAGNOSIS — F1010 Alcohol abuse, uncomplicated: Secondary | ICD-10-CM | POA: Insufficient documentation

## 2016-07-03 DIAGNOSIS — J45909 Unspecified asthma, uncomplicated: Secondary | ICD-10-CM | POA: Insufficient documentation

## 2016-07-03 DIAGNOSIS — F909 Attention-deficit hyperactivity disorder, unspecified type: Secondary | ICD-10-CM | POA: Insufficient documentation

## 2016-07-03 NOTE — ED Provider Notes (Signed)
[facility]-EMERGENCY DEPT Provider Note   CSN: [id number] Arrival date & time: 07/03/16  1527   By signing my name below, I, Soijett Blue, attest that this documentation has been prepared under the direction and in the presence of Lorretta Harp, PA-C Electronically Signed: Soijett Blue, ED Scribe. 07/03/16. 6:38 PM.  History   Chief Complaint Chief Complaint  Patient presents with  . detox    HPI Alex [name] is a 31 y.o. male with a PMHx of ETOH abuse, who presents to the Emergency Department complaining of requesting detox onset today. He states that he is requesting detox from ETOH and cocaine and he last used cocaine yesterday and ETOH 4 hours ago PTA. Pt has not tried any medications for the relief of his symptoms. Denies IV drug use. He denies seizures, CP, SOB, back pain, chills, SI, HI, auditory/visual hallucinations, and any other symptoms. He states that he smokes 1 PPD of cigarettes.    The history is provided by the patient. No language interpreter was used.    Past Medical History:  Diagnosis Date  . ADHD (attention deficit hyperactivity disorder)   . Allergy   . Asthma   . Depressed   . ETOH abuse   . History of suicidal tendencies     Patient Active Problem List   Diagnosis Date Noted  . Substance induced mood disorder (HCC) 04/24/2016  . Polysubstance dependence (HCC) 04/23/2016  . Major depressive disorder, single episode, severe without psychotic features (HCC) 04/23/2016  . 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 Tobacco use disorder 02/01/2014  . ADHD (attention deficit hyperactivity disorder)     History reviewed. No pertinent surgical history.     Home Medications    Prior to Admission medications   Medication Sig Start Date End Date Taking? Authorizing Provider  albuterol (PROVENTIL HFA;VENTOLIN HFA) 108 (90 Base) MCG/ACT inhaler Inhale 2 puffs into the lungs every 6 (six) hours as needed for  wheezing or shortness of breath. 04/29/16   Oneta Rack, NP  albuterol (PROVENTIL) (2.5 MG/3ML) 0.083% nebulizer solution Take 3 mLs (2.5 mg total) by nebulization every 6 (six) hours as needed for wheezing or shortness of breath. Patient not taking: Reported on 04/23/2016 10/02/15   Danelle Berry, PA-C  hydrocortisone cream 0.5 % Apply topically 4 (four) times daily as needed for itching. 04/29/16   Oneta Rack, NP  hydrOXYzine (ATARAX/VISTARIL) 50 MG tablet Take 1 tablet (50 mg total) by mouth every 6 (six) hours as needed for anxiety. 04/29/16   Oneta Rack, NP  naltrexone (DEPADE) 50 MG tablet Take 1 tablet (50 mg total) by mouth daily. 04/30/16   Georgiann Cocker, MD  nicotine polacrilex (NICORETTE) 2 MG gum Take 1 each (2 mg total) by mouth as needed for smoking cessation. 04/29/16   Oneta Rack, NP  traZODone (DESYREL) 50 MG tablet Take 1 tablet (50 mg total) by mouth at bedtime and may repeat dose one time if needed. 04/29/16   Oneta Rack, NP    Family History Family History  Problem Relation Age of Onset  . COPD Other     Social History Social History  Substance Use Topics  . Smoking status: Current Every Day Smoker    Packs/day: 0.50    Years: 10.00    Types: Cigarettes  . Smokeless tobacco: Never Used  . Alcohol use Yes     Comment: daily use     Allergies   Patient  has no known allergies.   Review of Systems Review of Systems  Constitutional: Negative for chills and fever.  Neurological: Negative for seizures.  Psychiatric/Behavioral: Negative for hallucinations and suicidal ideas.       No SI/HI     Physical Exam Updated Vital Signs BP 100/74 (BP Location: Right Arm)   Pulse 100   Temp 98.3 F (36.8 C) (Oral)   Resp 20   Ht 6' (1.829 m)   Wt 65.8 kg   SpO2 99%   BMI 19.67 kg/m   Physical Exam  Constitutional: He appears well-developed and well-nourished.  Well appearing  HENT:  Head: Normocephalic and atraumatic.  Nose: Nose normal.    Eyes: Conjunctivae and EOM are normal.  Neck: Normal range of motion.  Cardiovascular: Normal rate, regular rhythm, normal heart sounds and intact distal pulses.  Exam reveals no gallop and no friction rub.   No murmur heard. Pulmonary/Chest: Effort normal and breath sounds normal. No respiratory distress. He has no wheezes. He has no rales.  Normal work of breathing. No respiratory distress noted.   Abdominal: Soft.  Musculoskeletal: Normal range of motion.  Neurological: He is alert.  Skin: Skin is warm.  No train track appearance on forearms  Psychiatric: He has a normal mood and affect. His behavior is normal.  Nursing note and vitals reviewed.    ED Treatments / Results  DIAGNOSTIC STUDIES: Oxygen Saturation is 95% on RA, nl by my interpretation.    COORDINATION OF CARE: 6:38 PM Discussed treatment plan with pt at bedside and pt agreed to plan.   Procedures Procedures (including critical care time)  Medications Ordered in ED Medications - No data to display   Initial Impression / Assessment and Plan / ED Course  I have reviewed the triage vital signs and the nursing notes.  Patient here for detox. Patient here with no active medical problems at this time. he is in no apparent distress, hemodynamically stable, afebrile. No seizures or tremors noted here. Patient here with no other complaints. Patient denied SI/HI and visual/auditory hallucinations. Patient given information on intensive outpatient treatment for detoxification. Patient case spoke with Dr. Corlis Leak who agreed with assessment and plan. I feel safe for discharge at this time. Reasons to immediately return to the ED discussed. Patient also given instruction to follow-up with his primary care provider regarding today's visit.  Final Clinical Impressions(s) / ED Diagnoses   Final diagnoses:  Substance abuse, continuous    New Prescriptions Discharge Medication List as of 07/03/2016  6:41 PM     I personally  performed the services described in this documentation, which was scribed in my presence. The recorded information has been reviewed and is accurate.   [address] [place], [place] 07/03/16 1959    Courteney Randall An, MD 07/05/16 [phone]

## 2016-07-03 NOTE — Discharge Instructions (Signed)
Substance Abuse Treatment Programs ° °Intensive Outpatient Programs °[facility]     °[address]      °[place], [place]                   °[phone]      ° °[facility] °[address] °[place], [place] °[phone] ° °[facility]     °(Inpatient and outpatient)     °[address]           °[phone]   ° °[facility] °[phone] (Suboxone and Methadone) ° °[address]      °[place], [place] [address]      °[phone]      ° °[address] °[place], [place] °[phone] ° °[facility] (Outpatient/Inpatient, [facility])    °(insurance only) [phone]      °       °[facility] (Groups & Residential) °[place], [place] °[phone] ° °   °[facility]     °[address]     °[place], [place]      °[phone]      ° °[facility] (for caregivers and family) °[address] °[place], [place] °336-299-4655 ° ° ° ° ° °Residential Treatment Programs °[facility]      °[address], [place], [place] [address]  °[phone]      ° °[facility] °[address] [place], [place] [address] °[phone] ° °[facility]        °[phone]      ° °[facility] °1-800-659-3381 ° °[facility]             °[address]                                         °[place], [place]                                                °877-615-2722 or 336-784-9470                              ° °[facility] [place] °[address] °[place], [address] °1.877.941.8954 ° °[facility]    °[address]      °[place], [place]     °[phone]      ° °[facility] °[address] °, [place] °336-285-9073 ° °[facility]   °[address]     °[place], [place] [address]     °[phone]      °Admissions: 8am-3pm M-F ° °[facility] °[address] °[place],  [place] °336-227-7417 ° °[facility]: Residential Program (90 Days)   °[place], [place]      °336-725-8389 or 800-758-6077    ° °[facility] °[place],  °(Walk in Hours over the weekend or by referral) ° °[facility] °[address], [place],  [address] °([phone] ° °Crisis Mobile: [facility]:  1-877-626-1772 (for crisis response 24 hours a day) °[facility] Hotline:      1-800-256-2452 °Outpatient Psychiatry and Counseling ° °[facility]: Mobile Crisis   Management 24 hours:  1-877-626-1772 ° °[facility] sliding scale fee and walk in schedule: M-F 8am-12pm/1pm-3pm °[address]  °[place], [place] [address] °[phone] ° °[facility] °[address] °[place], [place] [address] °336-703-9650 ° °[facility] ([facility] new patient walk-in appointments available Monday - Friday 8am -3pm.          °[address] °[place], [place] [address] °336-676-6840 or crisis line- 336-676-6905 ° °[facility] Intensive Outpatient Therapy Program °[address] °[place], [place] [address] °336-832-9804 ° °[facility]                  °Crisis Services      °336.641.4993      °[address]     °[place], [place] [address]                ° °[facility]   °[facility] °[phone] °[address] °[place], [place] [address] ° ° °[facility]          °[address]  °[place], [place] [address]       °([phone] ° °[facility] °[address] °[place], [place] [address] °336-292-1510 ° °[facility]    °[address]    °[place], [place] [address]     °[phone]      ° °Parish McKinney, [place]     °[address]     °[place] [address]     °336-282-1251     °  °[facility] °[address] °[place] [address] ° °[facility]     °[address]     °[place], [place]      °336-542-2076      ° °[facility] °Shamsher Ahluwalia, [place] °[address] °[place], [place] [address] °[phone] ° °[facility]     °[address]     °[place], [place] [address]     °336-274-1237      ° °[facility] °[address] °[place], [place] [address] °336-854-4450 °Resources for Temporary Residential Assistance/Crisis Centers ° °DAY CENTERS °[facility] °M-F 8am-3pm   °[address] [place], [place] [address]   336-332-0824 °Services include: laundry, barbering, support groups, case management, phone  & computer access, showers, AA/NA mtgs, mental health/substance abuse nurse, job skills class, disability information, VA assistance, spiritual classes, etc.  ° °HOMELESS SHELTERS ° °[facility]     °[facility]   °[address], [place]     °[phone]       °       °[facility] (women and children)       °[address] °[place], [place] [address] °336-275-0820 °[email or web address] for application and process °Application Required ° °[facility]   °[address]    °[place] [address]     °[phone]       °             °[facility] °[address] °,  [address] °336.273.5572 °336-235-0363(schedule application appt.) °Application Required ° °[facility] (women only)    °[address]     °[place],  [address]     °336-884-1039      °  Intake starts 6pm daily °Need valid ID, SSC, & Police report °[facility] [place] °[address] °[place], [place] °336-881-5420 °Application Required ° °[facility] (men only)     °[address]      °[place], [place]     °[phone]      ° °[facility] °(Pregnant women only) °[address] °[place], [place] °336-275-0206 ° °[facility]      °[address]      °[place], [place] [address]     °[phone]      °       °[facility] °[address] °[place], [place] °336-723-1848 °90 day commitment/SA/Application process ° °[facility](men only)     °[address]     °[place], [place]     °[phone]       °Check-in at 7pm     °       °[facility] °[address] °[place], [place] [address] °336-248-6684 °Men/Women/Women and Children must be there by 7 pm ° °[facility] °[place],  °336-722-8721                ° °

## 2016-07-03 NOTE — ED Notes (Signed)
Attempted to call patient for a second time for assigned room with no answer.

## 2016-07-03 NOTE — ED Triage Notes (Signed)
Patient requesting detox from cocaine and alcohol. Patient denies SI/HI. Patient denies visual or auditory hallucinations

## 2016-07-03 NOTE — ED Notes (Signed)
Attempted to call patient for assigned room with no answer.  

## 2016-10-25 ENCOUNTER — Emergency Department (HOSPITAL_COMMUNITY)
Admission: EM | Admit: 2016-10-25 | Discharge: 2016-10-25 | Disposition: A | Discharge status: Home / Self Care | Payer: BLUE CROSS/BLUE SHIELD | Attending: Emergency Medicine | Admitting: Emergency Medicine

## 2016-10-25 ENCOUNTER — Encounter (HOSPITAL_COMMUNITY): Payer: Self-pay | Admitting: Emergency Medicine

## 2016-10-25 DIAGNOSIS — R0602 Shortness of breath: Secondary | ICD-10-CM | POA: Insufficient documentation

## 2016-10-25 DIAGNOSIS — Z5321 Procedure and treatment not carried out due to patient leaving prior to being seen by health care provider: Secondary | ICD-10-CM | POA: Insufficient documentation

## 2016-10-25 MED ORDER — ALBUTEROL SULFATE (2.5 MG/3ML) 0.083% IN NEBU
INHALATION_SOLUTION | RESPIRATORY_TRACT | Status: DC
Start: 2016-10-25 — End: 2016-10-25
  Filled 2016-10-25: qty 6

## 2016-10-25 MED ORDER — ALBUTEROL SULFATE (2.5 MG/3ML) 0.083% IN NEBU: 5.0000 mg | INHALATION_SOLUTION | Freq: Once | RESPIRATORY_TRACT | Status: DC

## 2016-10-25 NOTE — ED Triage Notes (Signed)
Pt presents with shob, insp and exp wheezing. Retractions.  Pt states he felt shob before going to sleep then awoke wheezing, pt does have hx of asthma but out of meds, pt states has no exacerbations but has been ill recently with cold like s/s and sore throat.

## 2016-10-25 NOTE — ED Notes (Signed)
Pt unable to be found for CXR x 2

## 2016-10-25 NOTE — ED Notes (Signed)
Neb treatment resolved much of wheezing. Pt states he is feeling much better.

## 2016-10-25 NOTE — ED Notes (Signed)
Pt called for xray x2 with no answer.

## 2016-10-25 NOTE — ED Notes (Signed)
Pt called for room with no answer. Provider notified.

## 2017-01-16 ENCOUNTER — Ambulatory Visit: Payer: Self-pay | Admitting: Emergency Medicine

## 2017-01-16 VITALS — BP 110/65 | HR 96 | Temp 98.8°F | Wt 145.0 lb

## 2017-01-16 DIAGNOSIS — J209 Acute bronchitis, unspecified: Secondary | ICD-10-CM

## 2017-01-16 DIAGNOSIS — J440 Chronic obstructive pulmonary disease with acute lower respiratory infection: Secondary | ICD-10-CM

## 2017-01-16 DIAGNOSIS — J4541 Moderate persistent asthma with (acute) exacerbation: Secondary | ICD-10-CM

## 2017-01-16 MED ORDER — ALBUTEROL SULFATE (2.5 MG/3ML) 0.083% IN NEBU: 2.5000 mg | INHALATION_SOLUTION | RESPIRATORY_TRACT | 2 refills | Status: DC | PRN

## 2017-01-16 MED ORDER — ALBUTEROL SULFATE HFA 108 (90 BASE) MCG/ACT IN AERS: 1.0000 | INHALATION_SPRAY | RESPIRATORY_TRACT | 2 refills | Status: DC | PRN

## 2017-01-16 MED ORDER — BECLOMETHASONE DIPROP HFA 80 MCG/ACT IN AERB: 2.0000 | INHALATION_SPRAY | Freq: Two times a day (BID) | RESPIRATORY_TRACT | 1 refills | Status: DC

## 2017-01-16 MED ORDER — GUAIFENESIN-CODEINE 100-10 MG/5ML PO SOLN: 5.0000 mL | Freq: Three times a day (TID) | ORAL | 0 refills | Status: DC | PRN

## 2017-01-16 MED ORDER — AZITHROMYCIN 250 MG PO TABS: ORAL_TABLET | ORAL | 0 refills | Status: DC

## 2017-01-16 MED ORDER — PREDNISONE 50 MG PO TABS
ORAL_TABLET | ORAL | 0 refills | Status: DC
Start: 2017-01-16 — End: 2017-04-30

## 2017-01-16 NOTE — Progress Notes (Signed)
Subjective:  [name] M Christo is a 31 y.o. male who presents for evaluation of URI like symptoms and asthma exacerbation.  Symptoms include congestion, fever: low grade fevers, nasal congestion, non productive cough and wheezing.  Onset of symptoms was 2 weeks ago, and has been gradually worsening since that time.  Treatment to date:  acetaminophen, antihistamines, cough suppressants, decongestants, rest and albuterol inhaliers.  High risk factors for influenza complications:  asthma.  With regard to asthma symptoms, he has been using his albuterol inhaler and nebulizer every 4 hours with relief, prior to becoming ill, he had increased inhaler usage, he does not currently have PCP and is not on a controller medicine. Symptoms occurring nearly nightly. He does smoke, but has decreased from 1 pack per day to 5 cigarettes a day. Occupational risks include exposure to mold and fumes. He will be getting insurance in 7 days and would like refills on his asthma medicines.  The following portions of the patient's history were reviewed and updated as appropriate:  allergies, current medications and past medical history. Review of Systems  Constitutional: Positive for chills and fever.  HENT: Positive for congestion. Negative for ear discharge, ear pain and sinus pain.   Respiratory: Positive for cough, shortness of breath and wheezing.   Cardiovascular: Negative.   Gastrointestinal: Negative for diarrhea, nausea and vomiting.  Musculoskeletal: Negative.   Skin: Negative.   Neurological: Positive for weakness. Negative for focal weakness, loss of consciousness and headaches.  Psychiatric/Behavioral: Negative for depression and suicidal ideas. Substance abuse: ETOH.    Objective:   Vitals:   01/16/17 1223  BP: 110/65  Pulse: 96  Temp: 98.8 F (37.1 C)  SpO2: 97%   Physical Exam  Constitutional: He is well-developed, well-nourished, and in no distress. No distress.  HENT:  Head: Normocephalic.   Right Ear: Tympanic membrane and external ear normal.  Left Ear: Tympanic membrane and external ear normal.  Mouth/Throat: Oropharynx is clear and moist.  Eyes: Pupils are equal, round, and reactive to light.  Neck: Normal range of motion. Neck supple.  Cardiovascular: Normal rate and normal heart sounds.  Pulmonary/Chest: Effort normal. No respiratory distress. He has wheezes.  Abdominal: There is no tenderness.  Lymphadenopathy:    He has no cervical adenopathy.  Neurological: He is alert.  Skin: Skin is warm and dry. He is not diaphoretic.  Psychiatric: Mood and affect normal.  Nursing note and vitals reviewed.     Assessment:  Bronchitis Asthma Exacerbation   Plan:  Discussed diagnosis and treatment of URI. Educational material distributed and questions answered. Suggested symptomatic OTC remedies. Supportive care with appropriate antipyretics and fluids. Follow up in 1 weeks or as needed. Provided refills on his albuterol prescriptions, due to frequency of symptoms, will start a controller medicine, QVAR Rediinhaler, provided contact information of PCP currently accepting new patients.Encouraged to seek treatment at the ER if symptoms worsen at anytime

## 2017-01-16 NOTE — Patient Instructions (Signed)

## 2017-04-30 ENCOUNTER — Ambulatory Visit (HOSPITAL_COMMUNITY)
Admission: EM | Admit: 2017-04-30 | Discharge: 2017-04-30 | Disposition: A | Discharge status: Home / Self Care | Payer: 59 | Attending: Internal Medicine | Admitting: Internal Medicine

## 2017-04-30 ENCOUNTER — Encounter (HOSPITAL_COMMUNITY): Payer: Self-pay | Admitting: Emergency Medicine

## 2017-04-30 DIAGNOSIS — R062 Wheezing: Secondary | ICD-10-CM

## 2017-04-30 DIAGNOSIS — J45901 Unspecified asthma with (acute) exacerbation: Secondary | ICD-10-CM | POA: Diagnosis not present

## 2017-04-30 DIAGNOSIS — R05 Cough: Secondary | ICD-10-CM | POA: Diagnosis not present

## 2017-04-30 DIAGNOSIS — J40 Bronchitis, not specified as acute or chronic: Secondary | ICD-10-CM

## 2017-04-30 DIAGNOSIS — J019 Acute sinusitis, unspecified: Secondary | ICD-10-CM | POA: Diagnosis not present

## 2017-04-30 MED ORDER — IPRATROPIUM-ALBUTEROL 0.5-2.5 (3) MG/3ML IN SOLN
3.0000 mL | Freq: Once | RESPIRATORY_TRACT | Status: AC
  Administered 2017-04-30: 3 mL via RESPIRATORY_TRACT

## 2017-04-30 MED ORDER — ALBUTEROL SULFATE HFA 108 (90 BASE) MCG/ACT IN AERS: 1.0000 | INHALATION_SPRAY | RESPIRATORY_TRACT | 2 refills | Status: DC | PRN

## 2017-04-30 MED ORDER — PREDNISONE 50 MG PO TABS: 50.0000 mg | ORAL_TABLET | Freq: Every day | ORAL | 0 refills | Status: DC

## 2017-04-30 MED ORDER — IPRATROPIUM-ALBUTEROL 0.5-2.5 (3) MG/3ML IN SOLN
RESPIRATORY_TRACT | Status: AC
  Filled 2017-04-30: qty 3

## 2017-04-30 MED ORDER — AMOXICILLIN-POT CLAVULANATE 875-125 MG PO TABS: 1.0000 | ORAL_TABLET | Freq: Two times a day (BID) | ORAL | 0 refills | Status: AC

## 2017-04-30 MED ORDER — ALBUTEROL SULFATE HFA 108 (90 BASE) MCG/ACT IN AERS: 1.0000 | INHALATION_SPRAY | RESPIRATORY_TRACT | 0 refills | Status: DC | PRN

## 2017-04-30 NOTE — ED Triage Notes (Signed)
PT reports cough, congestion, chest congestion, vomiting after coughing, heart burn, and blood in mucus.   PT has asthma and reports difficulty breathing.   Symptoms for over 1 week.

## 2017-04-30 NOTE — ED Provider Notes (Signed)
[facility]    CSN: [id number] Arrival date & time: 04/30/17  1238     History   Chief Complaint Chief Complaint  Patient presents with  . URI  . Asthma    HPI Alex [name] is a 32 y.o. male.   He has a history of asthma but has been without insurance and is not currently taking any meds.  He presents today with 2-week history of increasing cough and head congestion, cannot stop coughing, nose is constantly running.  Feeling worse in the last week.  Some headache.  Posttussive emesis.  He works in Production designer, theatre/television/filmmaintenance for an apartment complex and his employer has asked him to have an assessment.    HPI  Past Medical History:  Diagnosis Date  . ADHD (attention deficit hyperactivity disorder)   . Allergy   . Asthma   . Depressed   . ETOH abuse   . History of suicidal tendencies     Patient Active Problem List   Diagnosis Date Noted  . Substance induced mood disorder (HCC) 04/24/2016  . Polysubstance dependence (HCC) 04/23/2016  . Major depressive disorder, single episode, severe without psychotic features (HCC) 04/23/2016  . 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 Tobacco use disorder 02/01/2014  . ADHD (attention deficit hyperactivity disorder)     History reviewed. No pertinent surgical history.     Home Medications    Prior to Admission medications   Medication Sig Start Date End Date Taking? Authorizing Provider  traZODone (DESYREL) 50 MG tablet Take 1 tablet (50 mg total) by mouth at bedtime and may repeat dose one time if needed. 04/29/16  Yes Oneta RackLewis, Tanika N, NP  albuterol (PROVENTIL HFA) 108 (90 Base) MCG/ACT inhaler Inhale 1-2 puffs into the lungs every 4 (four) hours as needed for wheezing or shortness of breath. 04/30/17   Isa RankinMurray, Jahnasia Tatum Wilson, MD  albuterol (PROVENTIL HFA;VENTOLIN HFA) 108 (90 Base) MCG/ACT inhaler Inhale 1-2 puffs into the lungs every 4 (four) hours as needed for wheezing or shortness  of breath. 04/30/17   Isa RankinMurray, Lejuan Botto Wilson, MD  amoxicillin-clavulanate (AUGMENTIN) 875-125 MG tablet Take 1 tablet by mouth 2 (two) times daily for 10 days. 04/30/17 05/10/17  Isa RankinMurray, Dea Bitting Wilson, MD  azithromycin (ZITHROMAX) 250 MG tablet 2 tablets today then one daily till finish 01/16/17   Dorena BodoKennard, Lawrence, NP  beclomethasone (QVAR REDIHALER) 80 MCG/ACT inhaler Inhale 2 puffs 2 (two) times daily into the lungs. 01/16/17   Dorena BodoKennard, Lawrence, NP  guaiFENesin-codeine 100-10 MG/5ML syrup Take 5 mLs 3 (three) times daily as needed by mouth for cough. 01/16/17   Dorena BodoKennard, Lawrence, NP  predniSONE (DELTASONE) 50 MG tablet Take 1 tablet (50 mg total) by mouth daily. 04/30/17   Isa RankinMurray, Pamula Luther Wilson, MD  Pseudoephedrine-APAP-DM (TYLENOL COLD/FLU SEVERE DAY PO) Take once by mouth.    [provider]    Family History Family History  Problem Relation Age of Onset  . COPD Other     Social History Social History   Tobacco Use  . Smoking status: Current Every Day Smoker    Packs/day: 0.50    Years: 10.00    Pack years: 5.00    Types: Cigarettes  . Smokeless tobacco: Never Used  Substance Use Topics  . Alcohol use: Yes    Alcohol/week: 7.2 oz    Types: 12 Cans of beer per week    Comment: 12 beers daily  . Drug use: Yes    Frequency: 6.0 times  per week    Types: Marijuana, Cocaine    Comment: daily use cocaine (snort)     Allergies   Patient has no known allergies.   Review of Systems Review of Systems  All other systems reviewed and are negative.    Physical Exam Triage Vital Signs ED Triage Vitals  Enc Vitals Group     BP 04/30/17 1408 (!) 143/88     Pulse Rate 04/30/17 1408 76     Resp 04/30/17 1408 18     Temp 04/30/17 1408 98.3 F (36.8 C)     Temp Source 04/30/17 1408 Oral     SpO2 04/30/17 1408 97 %     Weight 04/30/17 1409 150 lb (68 kg)     Height --      Pain Score 04/30/17 1409 5     Pain Loc --    Updated Vital Signs BP (!) 143/88   Pulse 76   Temp  98.3 F (36.8 C) (Oral)   Resp 18   Wt 150 lb (68 kg)   SpO2 97%   BMI 20.34 kg/m   Physical Exam  Constitutional: He is oriented to person, place, and time. No distress.  Alert, nicely groomed Sounds extremely congested, frequent coughing  HENT:  Head: Atraumatic.  B TMs red, quite dull Marked nasal congestion  Eyes:  Conjugate gaze, no eye redness/drainage  Neck: Neck supple.  Cardiovascular: Normal rate and regular rhythm.  Pulmonary/Chest: No respiratory distress. He has no wheezes. He has no rales.  Diffuse exp wheezing, symmetric breath sounds  Abdominal: He exhibits no distension.  Musculoskeletal: Normal range of motion.  Neurological: He is alert and oriented to person, place, and time.  Skin: Skin is warm and dry.  No cyanosis  Nursing note and vitals reviewed.    UC Treatments / Results   Procedures Procedures (including critical care time)  Medications Ordered in UC Medications  ipratropium-albuterol (DUONEB) 0.5-2.5 (3) MG/3ML nebulizer solution 3 mL (3 mLs Nebulization Given 04/30/17 1454)    Final Clinical Impressions(s) / UC Diagnoses   Final diagnoses:  Acute sinusitis with symptoms > 10 days  Asthmatic bronchitis with acute exacerbation, unspecified asthma severity, unspecified whether persistent   Anticipate gradual improvement in head congestion, coughing, well being, over the next several days.  Cough may take a couple weeks to subside.  Prescriptions for amoxicillin/clavulanate (antibiotic), prednisone (for wheezing, coughing, congestion), and albuterol inhaler (for asthma) were sent to the pharmacy.  Note for work, return to work 2/21.  Recheck for new fever >100.5, increasing phlegm production/nasal discharge, or if not starting to improve in a few days.      ED Discharge Orders        Ordered    amoxicillin-clavulanate (AUGMENTIN) 875-125 MG tablet  2 times daily     04/30/17 1520    predniSONE (DELTASONE) 50 MG tablet  Daily     04/30/17  1520    albuterol (PROVENTIL HFA;VENTOLIN HFA) 108 (90 Base) MCG/ACT inhaler  Every 4 hours PRN     04/30/17 1520    albuterol (PROVENTIL HFA) 108 (90 Base) MCG/ACT inhaler  Every 4 hours PRN     04/30/17 1520         Isa Rankin, MD 05/02/17 1431

## 2017-04-30 NOTE — Discharge Instructions (Addendum)
Anticipate gradual improvement in head congestion, coughing, well being, over the next several days.  Cough may take a couple weeks to subside.  Prescriptions for amoxicillin/clavulanate (antibiotic), prednisone (for wheezing, coughing, congestion), and albuterol inhaler (for asthma) were sent to the pharmacy.  Note for work, return to work 2/21.  Recheck for new fever >100.5, increasing phlegm production/nasal discharge, or if not starting to improve in a few days.

## 2017-07-25 ENCOUNTER — Ambulatory Visit (HOSPITAL_COMMUNITY)
Admission: EM | Admit: 2017-07-25 | Discharge: 2017-07-25 | Disposition: A | Discharge status: Home / Self Care | Payer: 59 | Attending: Family Medicine | Admitting: Family Medicine

## 2017-07-25 ENCOUNTER — Encounter (HOSPITAL_COMMUNITY): Payer: Self-pay | Admitting: Family Medicine

## 2017-07-25 DIAGNOSIS — J4521 Mild intermittent asthma with (acute) exacerbation: Secondary | ICD-10-CM

## 2017-07-25 MED ORDER — BECLOMETHASONE DIPROP HFA 80 MCG/ACT IN AERB: 2.0000 | INHALATION_SPRAY | Freq: Two times a day (BID) | RESPIRATORY_TRACT | 0 refills | Status: DC

## 2017-07-25 MED ORDER — IPRATROPIUM-ALBUTEROL 0.5-2.5 (3) MG/3ML IN SOLN
RESPIRATORY_TRACT | Status: AC
  Filled 2017-07-25: qty 3

## 2017-07-25 MED ORDER — MONTELUKAST SODIUM 10 MG PO TABS: 10.0000 mg | ORAL_TABLET | Freq: Every day | ORAL | 0 refills | Status: DC

## 2017-07-25 MED ORDER — IPRATROPIUM-ALBUTEROL 0.5-2.5 (3) MG/3ML IN SOLN: 3.0000 mL | Freq: Four times a day (QID) | RESPIRATORY_TRACT | 0 refills | Status: DC | PRN

## 2017-07-25 MED ORDER — PREDNISONE 20 MG PO TABS: 40.0000 mg | ORAL_TABLET | Freq: Every day | ORAL | 0 refills | Status: AC

## 2017-07-25 MED ORDER — ALBUTEROL SULFATE HFA 108 (90 BASE) MCG/ACT IN AERS: 1.0000 | INHALATION_SPRAY | RESPIRATORY_TRACT | 0 refills | Status: DC | PRN

## 2017-07-25 MED ORDER — IPRATROPIUM-ALBUTEROL 0.5-2.5 (3) MG/3ML IN SOLN
3.0000 mL | Freq: Once | RESPIRATORY_TRACT | Status: AC
  Administered 2017-07-25: 3 mL via RESPIRATORY_TRACT

## 2017-07-25 NOTE — ED Provider Notes (Signed)
[facility]    CSN: [id number] Arrival date & time: 07/25/17  1201     History   Chief Complaint Chief Complaint  Patient presents with  . Asthma    HPI JOHNATHON OLDEN is a 32 y.o. male.   Bobbyjoe presents with complaints of shortness of breath  And wheezing which has been worsening over the past few days as he ran out of inhaler and nebulizer. Symptoms are worse at night, he notices he wakes at night to use inhaler. Occasional cough and mucus production. Otherwise without runny nose, sore throat, ear pain, fevers, chills, chest pain. States he works around a lot of dust and mold. Does not take any allergy medication. Received breathing treatment in triage and states now does not feel shortness of breath. Does not have a PCP. Does smoke, has been trying to decrease. Hx of asthma, adhd, allergies, polysubstance use.    ROS per HPI.      Past Medical History:  Diagnosis Date  . ADHD (attention deficit hyperactivity disorder)   . Allergy   . Asthma   . Depressed   . ETOH abuse   . History of suicidal tendencies     Patient Active Problem List   Diagnosis Date Noted  . Substance induced mood disorder (HCC) 04/24/2016  . Polysubstance dependence (HCC) 04/23/2016  . Major depressive disorder, single episode, severe without psychotic features (HCC) 04/23/2016  . 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 Tobacco use disorder 02/01/2014  . ADHD (attention deficit hyperactivity disorder)     History reviewed. No pertinent surgical history.     Home Medications    Prior to Admission medications   Medication Sig Start Date End Date Taking? Authorizing Provider  albuterol (PROVENTIL HFA) 108 (90 Base) MCG/ACT inhaler Inhale 1-2 puffs into the lungs every 4 (four) hours as needed for wheezing or shortness of breath. 07/25/17   Georgetta Haber, NP  albuterol (PROVENTIL HFA;VENTOLIN HFA) 108 (90 Base) MCG/ACT inhaler  Inhale 1-2 puffs into the lungs every 4 (four) hours as needed for wheezing or shortness of breath. 04/30/17   Isa Rankin, MD  beclomethasone (QVAR REDIHALER) 80 MCG/ACT inhaler Inhale 2 puffs into the lungs 2 (two) times daily. 07/25/17   Georgetta Haber, NP  ipratropium-albuterol (DUONEB) 0.5-2.5 (3) MG/3ML SOLN Take 3 mLs by nebulization every 6 (six) hours as needed (wheezing). 07/25/17   Georgetta Haber, NP  montelukast (SINGULAIR) 10 MG tablet Take 1 tablet (10 mg total) by mouth at bedtime. 07/25/17   Georgetta Haber, NP  predniSONE (DELTASONE) 20 MG tablet Take 2 tablets (40 mg total) by mouth daily with breakfast for 5 days. 07/25/17 07/30/17  Georgetta Haber, NP  traZODone (DESYREL) 50 MG tablet Take 1 tablet (50 mg total) by mouth at bedtime and may repeat dose one time if needed. 04/29/16   Oneta Rack, NP    Family History Family History  Problem Relation Age of Onset  . COPD Other     Social History Social History   Tobacco Use  . Smoking status: Current Every Day Smoker    Packs/day: 0.50    Years: 10.00    Pack years: 5.00    Types: Cigarettes  . Smokeless tobacco: Never Used  Substance Use Topics  . Alcohol use: Yes    Alcohol/week: 7.2 oz    Types: 12 Cans of beer per week    Comment: 12 beers daily  .  Drug use: Yes    Frequency: 6.0 times per week    Types: Marijuana, Cocaine    Comment: daily use cocaine (snort)     Allergies   Patient has no known allergies.   Review of Systems Review of Systems   Physical Exam Triage Vital Signs ED Triage Vitals  Enc Vitals Group     BP 07/25/17 1301 (!) 112/97     Pulse Rate 07/25/17 1301 (!) 101     Resp 07/25/17 1301 (!) 28     Temp 07/25/17 1301 98.5 F (36.9 C)     Temp src --      SpO2 07/25/17 1301 97 %     Weight --      Height --      Head Circumference --      Peak Flow --      Pain Score 07/25/17 1222 0     Pain Loc --      Pain Edu? --      Excl. in GC? --    No data  found.  Updated Vital Signs BP (!) 112/97   Pulse (!) 101   Temp 98.5 F (36.9 C)   Resp (!) 28   SpO2 97%    Physical Exam  Constitutional: He is oriented to person, place, and time. He appears well-developed and well-nourished.  HENT:  Head: Normocephalic and atraumatic.  Right Ear: Tympanic membrane, external ear and ear canal normal.  Left Ear: Tympanic membrane, external ear and ear canal normal.  Nose: Nose normal. Right sinus exhibits no maxillary sinus tenderness and no frontal sinus tenderness. Left sinus exhibits no maxillary sinus tenderness and no frontal sinus tenderness.  Mouth/Throat: Uvula is midline, oropharynx is clear and moist and mucous membranes are normal.  Eyes: Pupils are equal, round, and reactive to light. Conjunctivae are normal.  Neck: Normal range of motion.  Cardiovascular: Normal rate and regular rhythm.  Pulmonary/Chest: Effort normal. No accessory muscle usage. No tachypnea. No respiratory distress. He has wheezes.  Faint expiratory wheezes  Lymphadenopathy:    He has no cervical adenopathy.  Neurological: He is alert and oriented to person, place, and time.  Skin: Skin is warm and dry.  Vitals reviewed.    UC Treatments / Results  Labs (all labs ordered are listed, but only abnormal results are displayed) Labs Reviewed - No data to display  EKG None  Radiology No results found.  Procedures Procedures (including critical care time)  Medications Ordered in UC Medications  ipratropium-albuterol (DUONEB) 0.5-2.5 (3) MG/3ML nebulizer solution 3 mL (3 mLs Nebulization Given 07/25/17 1228)    Initial Impression / Assessment and Plan / UC Course  I have reviewed the triage vital signs and the nursing notes.  Pertinent labs & imaging results that were available during my care of the patient were reviewed by me and considered in my medical decision making (see chart for details).     RR improved with breathing treatment in clinic  today. Without acute distress. Ran out of medications. Will add nightly singulair at this time as well. 5 days of prednisone. Encouraged decrease to quit smoking. encouraged establish with PCP for asthma management. Return precautions provided. Patient verbalized understanding and agreeable to plan.    Final Clinical Impressions(s) / UC Diagnoses   Final diagnoses:  Mild intermittent asthma with exacerbation     Discharge Instructions     5 days of prednisone. Use of inhaler or nebulizer every 4-6 hours as needed for  shortness of breath or wheezing. Continue with twice daily qvar.  Nightly montelukast helps with asthma and allergy symptoms.  Continue to decrease to quite smoking.  If develop worsening of shortness of breath, fevers, chills, chest pain or otherwise worsening please return or go to Er.    ED Prescriptions    Medication Sig Dispense Auth. Provider   albuterol (PROVENTIL HFA) 108 (90 Base) MCG/ACT inhaler Inhale 1-2 puffs into the lungs every 4 (four) hours as needed for wheezing or shortness of breath. 1 Inhaler Linus Mako B, NP   beclomethasone (QVAR REDIHALER) 80 MCG/ACT inhaler Inhale 2 puffs into the lungs 2 (two) times daily. 1 Inhaler Burky, Natalie B, NP   ipratropium-albuterol (DUONEB) 0.5-2.5 (3) MG/3ML SOLN Take 3 mLs by nebulization every 6 (six) hours as needed (wheezing). 360 mL Linus Mako B, NP   predniSONE (DELTASONE) 20 MG tablet Take 2 tablets (40 mg total) by mouth daily with breakfast for 5 days. 10 tablet Linus Mako B, NP   montelukast (SINGULAIR) 10 MG tablet Take 1 tablet (10 mg total) by mouth at bedtime. 30 tablet Georgetta Haber, NP     Controlled Substance Prescriptions [place] Controlled Substance Registry consulted? Not Applicable   Georgetta Haber, NP 07/25/17 1333

## 2017-07-25 NOTE — Discharge Instructions (Signed)
5 days of prednisone. Use of inhaler or nebulizer every 4-6 hours as needed for shortness of breath or wheezing. Continue with twice daily qvar.  Nightly montelukast helps with asthma and allergy symptoms.  Continue to decrease to quite smoking.  If develop worsening of shortness of breath, fevers, chills, chest pain or otherwise worsening please return or go to Er.

## 2017-07-25 NOTE — ED Triage Notes (Addendum)
Pt here for cough, congestion, SOB and wheezing worsening  since last night. Reports hx of asthma and is out of meds.

## 2018-02-27 ENCOUNTER — Ambulatory Visit (HOSPITAL_COMMUNITY)
Admission: EM | Admit: 2018-02-27 | Discharge: 2018-02-27 | Disposition: A | Discharge status: Home / Self Care | Payer: 59 | Attending: Family Medicine | Admitting: Family Medicine

## 2018-02-27 ENCOUNTER — Other Ambulatory Visit: Payer: Self-pay

## 2018-02-27 ENCOUNTER — Encounter (HOSPITAL_COMMUNITY): Payer: Self-pay

## 2018-02-27 DIAGNOSIS — J209 Acute bronchitis, unspecified: Secondary | ICD-10-CM | POA: Insufficient documentation

## 2018-02-27 MED ORDER — IPRATROPIUM-ALBUTEROL 0.5-2.5 (3) MG/3ML IN SOLN
RESPIRATORY_TRACT | Status: AC
  Filled 2018-02-27: qty 3

## 2018-02-27 MED ORDER — ALBUTEROL SULFATE HFA 108 (90 BASE) MCG/ACT IN AERS: 1.0000 | INHALATION_SPRAY | RESPIRATORY_TRACT | 2 refills | Status: DC | PRN

## 2018-02-27 MED ORDER — IPRATROPIUM-ALBUTEROL 0.5-2.5 (3) MG/3ML IN SOLN
3.0000 mL | Freq: Once | RESPIRATORY_TRACT | Status: AC
  Administered 2018-02-27: 3 mL via RESPIRATORY_TRACT

## 2018-02-27 MED ORDER — PREDNISONE 20 MG PO TABS: 40.0000 mg | ORAL_TABLET | Freq: Every day | ORAL | 0 refills | Status: AC

## 2018-02-27 MED ORDER — BECLOMETHASONE DIPROP HFA 80 MCG/ACT IN AERB: 2.0000 | INHALATION_SPRAY | Freq: Two times a day (BID) | RESPIRATORY_TRACT | 0 refills | Status: DC

## 2018-02-27 NOTE — ED Triage Notes (Signed)
Pt cc chest congestion and cough. Pt is all of his asthma medication. X 2 weeks.

## 2018-02-27 NOTE — ED Provider Notes (Signed)
[facility]    CSN: [id number] Arrival date & time: 02/27/18  1629     History   Chief Complaint Chief Complaint  Patient presents with  . Cough  . Nasal Congestion    HPI Alex [name] is a 32 y.o. male.   Patient is a 32 year old male with past medical history of ADHD, allergies, asthma, depression, EtOH abuse.  He presents with approximately 2 to 3 weeks of cough, congestion, wheezing, shortness of breath.  His symptoms been constant remain the same.  He has ran out of his albuterol inhaler.  He is still using his daily Qvar.  He does work outside in the cold air which patient symptoms worse.  He denies any fever, chills, body aches, night sweats.  He denies any ear pain or sore throat.  He is a current everyday smoker  ROS per HPI      Past Medical History:  Diagnosis Date  . ADHD (attention deficit hyperactivity disorder)   . Allergy   . Asthma   . Depressed   . ETOH abuse   . History of suicidal tendencies     Patient Active Problem List   Diagnosis Date Noted  . Substance induced mood disorder (HCC) 04/24/2016  . Polysubstance dependence (HCC) 04/23/2016  . Major depressive disorder, single episode, severe without psychotic features (HCC) 04/23/2016  . 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 Tobacco use disorder 02/01/2014  . ADHD (attention deficit hyperactivity disorder)     History reviewed. No pertinent surgical history.     Home Medications    Prior to Admission medications   Medication Sig Start Date End Date Taking? Authorizing Provider  albuterol (PROVENTIL HFA) 108 (90 Base) MCG/ACT inhaler Inhale 1-2 puffs into the lungs every 4 (four) hours as needed for wheezing or shortness of breath. 07/25/17   Georgetta Haber, NP  albuterol (PROVENTIL HFA;VENTOLIN HFA) 108 (90 Base) MCG/ACT inhaler Inhale 1-2 puffs into the lungs every 4 (four) hours as needed for wheezing or shortness of  breath. 02/27/18   Dahlia Byes A, NP  beclomethasone (QVAR REDIHALER) 80 MCG/ACT inhaler Inhale 2 puffs into the lungs 2 (two) times daily. 02/27/18   Rohith Fauth, Gloris Manchester A, NP  ipratropium-albuterol (DUONEB) 0.5-2.5 (3) MG/3ML SOLN Take 3 mLs by nebulization every 6 (six) hours as needed (wheezing). 07/25/17   Georgetta Haber, NP  montelukast (SINGULAIR) 10 MG tablet Take 1 tablet (10 mg total) by mouth at bedtime. 07/25/17   Georgetta Haber, NP  predniSONE (DELTASONE) 20 MG tablet Take 2 tablets (40 mg total) by mouth daily with breakfast for 5 days. 02/27/18 03/04/18  Dahlia Byes A, NP  traZODone (DESYREL) 50 MG tablet Take 1 tablet (50 mg total) by mouth at bedtime and may repeat dose one time if needed. 04/29/16   Oneta Rack, NP    Family History Family History  Problem Relation Age of Onset  . COPD Other     Social History Social History   Tobacco Use  . Smoking status: Current Every Day Smoker    Packs/day: 0.50    Years: 10.00    Pack years: 5.00    Types: Cigarettes  . Smokeless tobacco: Never Used  Substance Use Topics  . Alcohol use: Yes    Alcohol/week: 12.0 standard drinks    Types: 12 Cans of beer per week    Comment: 12 beers daily  . Drug use: Yes    Frequency: 6.0  times per week    Types: Marijuana, Cocaine    Comment: daily use cocaine (snort)     Allergies   Patient has no known allergies.   Review of Systems Review of Systems   Physical Exam Triage Vital Signs ED Triage Vitals  Enc Vitals Group     BP 02/27/18 1706 121/73     Pulse Rate 02/27/18 1706 80     Resp 02/27/18 1706 18     Temp 02/27/18 1706 98.1 F (36.7 C)     Temp Source 02/27/18 1706 Oral     SpO2 02/27/18 1706 98 %     Weight 02/27/18 1708 156 lb (70.8 kg)     Height --      Head Circumference --      Peak Flow --      Pain Score 02/27/18 1708 3     Pain Loc --      Pain Edu? --      Excl. in GC? --    No data found.  Updated Vital Signs BP 121/73 (BP Location: Right  Arm)   Pulse 80   Temp 98.1 F (36.7 C) (Oral)   Resp 18   Wt 156 lb (70.8 kg)   SpO2 98%   BMI 21.16 kg/m   Visual Acuity Right Eye Distance:   Left Eye Distance:   Bilateral Distance:    Right Eye Near:   Left Eye Near:    Bilateral Near:     Physical Exam Vitals signs and nursing note reviewed.  Constitutional:      Appearance: Normal appearance. He is not ill-appearing or toxic-appearing.  HENT:     Head: Normocephalic and atraumatic.     Right Ear: Tympanic membrane, ear canal and external ear normal.     Left Ear: Tympanic membrane, ear canal and external ear normal.     Nose: No congestion or rhinorrhea.     Mouth/Throat:     Mouth: Mucous membranes are moist.     Pharynx: Oropharynx is clear.  Eyes:     Conjunctiva/sclera: Conjunctivae normal.  Neck:     Musculoskeletal: Normal range of motion.  Cardiovascular:     Rate and Rhythm: Normal rate and regular rhythm.     Heart sounds: Normal heart sounds.  Pulmonary:     Effort: Pulmonary effort is normal.     Breath sounds: Wheezing present.     Comments: Expiratory wheezing throughout lung fields No dyspnea or distress Musculoskeletal: Normal range of motion.  Skin:    General: Skin is warm and dry.  Neurological:     Mental Status: He is alert.  Psychiatric:        Mood and Affect: Mood normal.      UC Treatments / Results  Labs (all labs ordered are listed, but only abnormal results are displayed) Labs Reviewed - No data to display  EKG None  Radiology No results found.  Procedures Procedures (including critical care time)  Medications Ordered in UC Medications  ipratropium-albuterol (DUONEB) 0.5-2.5 (3) MG/3ML nebulizer solution 3 mL (3 mLs Nebulization Given 02/27/18 1731)    Initial Impression / Assessment and Plan / UC Course  I have reviewed the triage vital signs and the nursing notes.  Pertinent labs & imaging results that were available during my care of the patient were  reviewed by me and considered in my medical decision making (see chart for details).     Acute bronchitis DuoNeb given in clinic Prescription sent  to the pharmacy for albuterol inhaler and prednisone daily for the next 5 days Instructed if symptoms persist or worsen to please follow-up Final Clinical Impressions(s) / UC Diagnoses   Final diagnoses:  Acute bronchitis, unspecified organism     Discharge Instructions     We are treating you for bronchitis Nebulizer treatment given here in clinic Prednisone daily for the next 5 days Prescription for albuterol inhaler sent to the pharmacy along with refills. If your symptoms continue or worsen please follow-up    ED Prescriptions    Medication Sig Dispense Auth. Provider   albuterol (PROVENTIL HFA;VENTOLIN HFA) 108 (90 Base) MCG/ACT inhaler Inhale 1-2 puffs into the lungs every 4 (four) hours as needed for wheezing or shortness of breath. 1 Inhaler Gershon Shorten A, NP   predniSONE (DELTASONE) 20 MG tablet Take 2 tablets (40 mg total) by mouth daily with breakfast for 5 days. 10 tablet Deetya Drouillard A, NP   beclomethasone (QVAR REDIHALER) 80 MCG/ACT inhaler Inhale 2 puffs into the lungs 2 (two) times daily. 1 Inhaler Dahlia ByesBast, Sharnelle Cappelli A, NP     Controlled Substance Prescriptions [place] Controlled Substance Registry consulted? Not Applicable   Janace ArisBast, Mirel Hundal A, NP 02/27/18 1738

## 2018-02-27 NOTE — Discharge Instructions (Signed)
We are treating you for bronchitis Nebulizer treatment given here in clinic Prednisone daily for the next 5 days Prescription for albuterol inhaler sent to the pharmacy along with refills. If your symptoms continue or worsen please follow-up

## 2018-04-14 ENCOUNTER — Emergency Department (HOSPITAL_COMMUNITY)
Admission: EM | Admit: 2018-04-14 | Discharge: 2018-04-14 | Disposition: A | Discharge status: Home / Self Care | Payer: Self-pay | Attending: Emergency Medicine | Admitting: Emergency Medicine

## 2018-04-14 ENCOUNTER — Other Ambulatory Visit: Payer: Self-pay

## 2018-04-14 ENCOUNTER — Encounter (HOSPITAL_COMMUNITY): Payer: Self-pay | Admitting: Emergency Medicine

## 2018-04-14 DIAGNOSIS — F17210 Nicotine dependence, cigarettes, uncomplicated: Secondary | ICD-10-CM | POA: Insufficient documentation

## 2018-04-14 DIAGNOSIS — Z79899 Other long term (current) drug therapy: Secondary | ICD-10-CM | POA: Insufficient documentation

## 2018-04-14 DIAGNOSIS — J4521 Mild intermittent asthma with (acute) exacerbation: Secondary | ICD-10-CM | POA: Insufficient documentation

## 2018-04-14 MED ORDER — ALBUTEROL SULFATE (2.5 MG/3ML) 0.083% IN NEBU
5.0000 mg | INHALATION_SOLUTION | Freq: Once | RESPIRATORY_TRACT | Status: AC
  Administered 2018-04-14: 5 mg via RESPIRATORY_TRACT
  Filled 2018-04-14: qty 6

## 2018-04-14 MED ORDER — IPRATROPIUM-ALBUTEROL 0.5-2.5 (3) MG/3ML IN SOLN: 3.0000 mL | Freq: Four times a day (QID) | RESPIRATORY_TRACT | 0 refills | Status: DC | PRN

## 2018-04-14 MED ORDER — ALBUTEROL SULFATE HFA 108 (90 BASE) MCG/ACT IN AERS
2.0000 | INHALATION_SPRAY | RESPIRATORY_TRACT | Status: DC | PRN
  Filled 2018-04-14: qty 6.7

## 2018-04-14 NOTE — Discharge Instructions (Addendum)
Get established with a primary care provider as soon as you are able. Return here as needed.

## 2018-04-14 NOTE — ED Notes (Signed)
Patient verbalizes understanding of discharge instructions. Opportunity for questioning and answers were provided. Armband removed by staff, pt discharged from ED. Ambulated out to lobby  

## 2018-04-14 NOTE — ED Triage Notes (Signed)
C/o sob with audible wheezing x 4 hours.  States he is out of inhalers.

## 2018-04-14 NOTE — ED Provider Notes (Signed)
[facility] EMERGENCY DEPARTMENT Provider Note   CSN: [id number] Arrival date & time: 04/14/18  0413     History   Chief Complaint Chief Complaint  Patient presents with  . Asthma    HPI Alex [name] is a 33 y.o. male.  Patient with a history of asthma, former smoker now vaping, presents with SOB c/w asthma exacerbation. He has been out of his inhaler for 3 days and out of his nebulizer solution for longer. No fever. He does have nasal congestion and cough. No vomiting.   The history is provided by the patient. No language interpreter was used.  Asthma  Associated symptoms include shortness of breath.    Past Medical History:  Diagnosis Date  . ADHD (attention deficit hyperactivity disorder)   . Allergy   . Asthma   . Depressed   . ETOH abuse   . History of suicidal tendencies     Patient Active Problem List   Diagnosis Date Noted  . Substance induced mood disorder (HCC) 04/24/2016  . Polysubstance dependence (HCC) 04/23/2016  . Major depressive disorder, single episode, severe without psychotic features (HCC) 04/23/2016  . 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 Tobacco use disorder 02/01/2014  . ADHD (attention deficit hyperactivity disorder)     History reviewed. No pertinent surgical history.      Home Medications    Prior to Admission medications   Medication Sig Start Date End Date Taking? Authorizing Provider  albuterol (PROVENTIL HFA) 108 (90 Base) MCG/ACT inhaler Inhale 1-2 puffs into the lungs every 4 (four) hours as needed for wheezing or shortness of breath. 07/25/17   Georgetta Haber, NP  albuterol (PROVENTIL HFA;VENTOLIN HFA) 108 (90 Base) MCG/ACT inhaler Inhale 1-2 puffs into the lungs every 4 (four) hours as needed for wheezing or shortness of breath. 02/27/18   Dahlia Byes A, NP  beclomethasone (QVAR REDIHALER) 80 MCG/ACT inhaler Inhale 2 puffs into the lungs 2 (two) times  daily. 02/27/18   Bast, Gloris Manchester A, NP  ipratropium-albuterol (DUONEB) 0.5-2.5 (3) MG/3ML SOLN Take 3 mLs by nebulization every 6 (six) hours as needed (wheezing). 07/25/17   Georgetta Haber, NP  montelukast (SINGULAIR) 10 MG tablet Take 1 tablet (10 mg total) by mouth at bedtime. 07/25/17   Georgetta Haber, NP  traZODone (DESYREL) 50 MG tablet Take 1 tablet (50 mg total) by mouth at bedtime and may repeat dose one time if needed. 04/29/16   Oneta Rack, NP    Family History Family History  Problem Relation Age of Onset  . COPD Other     Social History Social History   Tobacco Use  . Smoking status: Current Every Day Smoker    Packs/day: 0.50    Years: 10.00    Pack years: 5.00    Types: Cigarettes  . Smokeless tobacco: Never Used  Substance Use Topics  . Alcohol use: Yes    Alcohol/week: 12.0 standard drinks    Types: 12 Cans of beer per week    Comment: 12 beers daily  . Drug use: Yes    Frequency: 6.0 times per week    Types: Marijuana, Cocaine    Comment: daily use cocaine (snort)     Allergies   Patient has no known allergies.   Review of Systems Review of Systems  Constitutional: Negative for chills and fever.  HENT: Positive for congestion and rhinorrhea.   Respiratory: Positive for cough, chest tightness, shortness of  breath and wheezing.   Cardiovascular: Negative.   Gastrointestinal: Negative.  Negative for vomiting.  Musculoskeletal: Negative.  Negative for myalgias.  Skin: Negative.   Neurological: Negative.      Physical Exam Updated Vital Signs BP 117/81 (BP Location: Right Arm)   Pulse 81   Temp 97.7 F (36.5 C) (Oral)   Resp 18   SpO2 96%   Physical Exam Vitals signs and nursing note reviewed.  Constitutional:      Appearance: He is well-developed.  HENT:     Head: Normocephalic.     Nose: Congestion present.  Neck:     Musculoskeletal: Normal range of motion and neck supple.  Cardiovascular:     Rate and Rhythm: Normal rate and  regular rhythm.  Pulmonary:     Effort: Pulmonary effort is normal.     Breath sounds: Normal breath sounds. No wheezing or rhonchi.     Comments: Examined after nebulizer treatment x 1 Abdominal:     General: Bowel sounds are normal.     Palpations: Abdomen is soft.     Tenderness: There is no abdominal tenderness. There is no guarding or rebound.  Musculoskeletal: Normal range of motion.  Skin:    General: Skin is warm and dry.     Findings: No rash.  Neurological:     Mental Status: He is alert.     Cranial Nerves: No cranial nerve deficit.      ED Treatments / Results  Labs (all labs ordered are listed, but only abnormal results are displayed) Labs Reviewed - No data to display  EKG None  Radiology No results found.  Procedures Procedures (including critical care time)  Medications Ordered in ED Medications  albuterol (PROVENTIL) (2.5 MG/3ML) 0.083% nebulizer solution 5 mg (5 mg Nebulization Given 04/14/18 0446)     Initial Impression / Assessment and Plan / ED Course  I have reviewed the triage vital signs and the nursing notes.  Pertinent labs & imaging results that were available during my care of the patient were reviewed by me and considered in my medical decision making (see chart for details).     Patient to ED with wheezing, out of inhaler x 3 days. Cold and cough symptoms without fever.   He has had one nebulizer treatment and reports he feels "100%" better. He is not wheezing. VSS.   Will provide inhaler, Rx for neb solution. Encouraged to stop vaping.   Final Clinical Impressions(s) / ED Diagnoses   Final diagnoses:  None   1. Asthma exacerbation  ED Discharge Orders    None       Elpidio AnisUpstill, Keeton Kassebaum, PA-C 04/14/18 [id number]    Gilda CreasePollina, Christopher J, MD 04/14/18 0600

## 2018-04-14 NOTE — ED Notes (Addendum)
Pt at [facility] yelling and upset that he has not gone straight to treatment room.  Explained to pt that I am going to get him a breathing treatment and that 4 patients arrived at the same time he did.  Pt states he has never had to wait and it is unacceptable.  Pts sats 98% on arrival.  Pt speaking in complete sentences.  Audible wheezing is present.  Working on getting pt a treatment room. PT continued to yell about another pt that he assumed went back before him but had went to have labs obtained.

## 2018-04-29 ENCOUNTER — Other Ambulatory Visit: Payer: Self-pay

## 2018-04-29 ENCOUNTER — Encounter (HOSPITAL_COMMUNITY): Payer: Self-pay | Admitting: Pharmacy Technician

## 2018-04-29 ENCOUNTER — Emergency Department (HOSPITAL_COMMUNITY)
Admission: EM | Admit: 2018-04-29 | Discharge: 2018-04-30 | Disposition: A | Discharge status: Home / Self Care | Payer: Self-pay | Attending: Emergency Medicine | Admitting: Emergency Medicine

## 2018-04-29 DIAGNOSIS — Z5321 Procedure and treatment not carried out due to patient leaving prior to being seen by health care provider: Secondary | ICD-10-CM | POA: Insufficient documentation

## 2018-04-29 DIAGNOSIS — R062 Wheezing: Secondary | ICD-10-CM | POA: Insufficient documentation

## 2018-04-29 MED ORDER — ALBUTEROL SULFATE (2.5 MG/3ML) 0.083% IN NEBU
5.0000 mg | INHALATION_SOLUTION | Freq: Once | RESPIRATORY_TRACT | Status: AC
  Administered 2018-04-29: 5 mg via RESPIRATORY_TRACT
  Filled 2018-04-29: qty 6

## 2018-04-29 NOTE — ED Triage Notes (Signed)
Pt c/o hx of asthma and running out of medications earlier today. Pt with audible wheezing. Speaking in complete sentences.

## 2018-04-29 NOTE — ED Notes (Signed)
Pt stated that he felt much better after treatment and had to work tomorrow so he was [place] go home and sleep and go to urgent care to get inhaler tomorrow.  Pt is gone.

## 2018-07-05 ENCOUNTER — Ambulatory Visit (HOSPITAL_COMMUNITY)
Admission: EM | Admit: 2018-07-05 | Discharge: 2018-07-05 | Disposition: A | Discharge status: Home / Self Care | Payer: Self-pay | Attending: Family Medicine | Admitting: Family Medicine

## 2018-07-05 ENCOUNTER — Encounter (HOSPITAL_COMMUNITY): Payer: Self-pay | Admitting: Emergency Medicine

## 2018-07-05 ENCOUNTER — Other Ambulatory Visit: Payer: Self-pay

## 2018-07-05 DIAGNOSIS — J4522 Mild intermittent asthma with status asthmaticus: Secondary | ICD-10-CM

## 2018-07-05 MED ORDER — ALBUTEROL SULFATE (2.5 MG/3ML) 0.083% IN NEBU: 2.5000 mg | INHALATION_SOLUTION | Freq: Four times a day (QID) | RESPIRATORY_TRACT | 2 refills | Status: DC | PRN

## 2018-07-05 MED ORDER — ALBUTEROL SULFATE HFA 108 (90 BASE) MCG/ACT IN AERS: 1.0000 | INHALATION_SPRAY | RESPIRATORY_TRACT | 2 refills | Status: DC | PRN

## 2018-07-05 MED ORDER — PREDNISONE 10 MG (21) PO TBPK: ORAL_TABLET | Freq: Every day | ORAL | 0 refills | Status: DC

## 2018-07-05 NOTE — ED Triage Notes (Signed)
Pt c/o asthma flair up, states he ran out of his inhaler a while ago and has been using his nebulizer, now he has run out of his nebulizer medicine. Since yesterday

## 2018-07-09 NOTE — ED Provider Notes (Signed)
[facility]   [id number] 07/05/18 Arrival Time: 1628  ASSESSMENT & PLAN:  1. Asthma, not well controlled, mild intermittent, with status asthmaticus    Meds ordered this encounter  Medications  . albuterol (PROVENTIL HFA) 108 (90 Base) MCG/ACT inhaler    Sig: Inhale 1-2 puffs into the lungs every 4 (four) hours as needed for wheezing or shortness of breath.    Dispense:  1 Inhaler    Refill:  2  . albuterol (PROVENTIL) (2.5 MG/3ML) 0.083% nebulizer solution    Sig: Take 3 mLs (2.5 mg total) by nebulization every 6 (six) hours as needed for wheezing or shortness of breath.    Dispense:  75 mL    Refill:  2  . predniSONE (STERAPRED UNI-PAK 21 TAB) 10 MG (21) TBPK tablet    Sig: Take by mouth daily. Take as directed.    Dispense:  21 tablet    Refill:  0   Asthma precautions given. OTC symptom care as needed.  Follow-up Information    Schedule an appointment as soon as possible for a visit  with [facility].   Contact information: [address] [place] [address] [phone]         Reviewed expectations re: course of current medical issues. Questions answered. Outlined signs and symptoms indicating need for more acute intervention. Patient verbalized understanding. After Visit Summary given.  SUBJECTIVE: History from: patient.  Alex [name] is a 33 y.o. male who presents with complaint of intermittent chest tightness and wheezing. Triggers: none identified. Mild and occasional exacerbations with recent increase in pollen. Out of albuterol inhaler and requests refill. Describes wheezing as mild when present. Fever: no. Overall normal PO intake without n/v. Sick contacts: no. Typically his asthma is very poorly controlled. Inhaler use: prn with help. OTC treatment: none reported.  Social History   Tobacco Use  Smoking Status Current Every Day Smoker  . Packs/day: 0.50  . Years: 10.00  . Pack years:  5.00  . Types: Cigarettes  Smokeless Tobacco Never Used    ROS: As per HPI. All other systems negative.    OBJECTIVE:  Vitals:   07/05/18 1653  BP: 114/73  Pulse: (!) 57  Resp: 16  Temp: (!) 97.4 F (36.3 C)  SpO2: 94%     General appearance: alert; no distress HEENT: nasal congestion; clear runny nose; throat irritation secondary to post-nasal drainage Neck: supple without LAD Cv: slight bradycardia; regular rhythm; without murmer Lungs: unlabored respirations, mild bilateral expiratory wheezing; cough: absent; no significant respiratory distress; able to speak full sentences without difficulty Skin: warm and dry Psychological: alert and cooperative; normal mood and affect   No Known Allergies  Past Medical History:  Diagnosis Date  . ADHD (attention deficit hyperactivity disorder)   . Allergy   . Asthma   . Depressed   . ETOH abuse   . History of suicidal tendencies    Family History  Problem Relation Age of Onset  . COPD Other    Social History   Socioeconomic History  . Marital status: Single    Spouse name: Not on file  . Number of children: Not on file  . Years of education: Not on file  . Highest education level: Not on file  Occupational History  . Not on file  Social Needs  . Financial resource strain: Not on file  . Food insecurity:    Worry: Not on file    Inability: Not  on file  . Transportation needs:    Medical: Not on file    Non-medical: Not on file  Tobacco Use  . Smoking status: Current Every Day Smoker    Packs/day: 0.50    Years: 10.00    Pack years: 5.00    Types: Cigarettes  . Smokeless tobacco: Never Used  Substance and Sexual Activity  . Alcohol use: Yes    Alcohol/week: 12.0 standard drinks    Types: 12 Cans of beer per week    Comment: 12 beers daily  . Drug use: Yes    Frequency: 6.0 times per week    Types: Marijuana, Cocaine    Comment: daily use cocaine (snort)  . Sexual activity: Not Currently  Lifestyle  .  Physical activity:    Days per week: Not on file    Minutes per session: Not on file  . Stress: Not on file  Relationships  . Social connections:    Talks on phone: Not on file    Gets together: Not on file    Attends religious service: Not on file    Active member of club or organization: Not on file    Attends meetings of clubs or organizations: Not on file    Relationship status: Not on file  . Intimate partner violence:    Fear of current or ex partner: Not on file    Emotionally abused: Not on file    Physically abused: Not on file    Forced sexual activity: Not on file  Other Topics Concern  . Not on file  Social History Narrative  . Not on file            Mardella LaymanHagler, Arabella Revelle, MD 07/09/18 [phone]

## 2018-09-08 ENCOUNTER — Ambulatory Visit (INDEPENDENT_AMBULATORY_CARE_PROVIDER_SITE_OTHER)
Admission: RE | Admit: 2018-09-08 | Discharge: 2018-09-08 | Disposition: A | Discharge status: Home / Self Care | Payer: Self-pay | Source: Ambulatory Visit

## 2018-09-08 DIAGNOSIS — J4520 Mild intermittent asthma, uncomplicated: Secondary | ICD-10-CM

## 2018-09-08 MED ORDER — ALBUTEROL SULFATE HFA 108 (90 BASE) MCG/ACT IN AERS: 1.0000 | INHALATION_SPRAY | RESPIRATORY_TRACT | 2 refills | Status: DC | PRN

## 2018-09-08 MED ORDER — ALBUTEROL SULFATE (2.5 MG/3ML) 0.083% IN NEBU: 2.5000 mg | INHALATION_SOLUTION | Freq: Four times a day (QID) | RESPIRATORY_TRACT | 2 refills | Status: DC | PRN

## 2018-09-08 NOTE — ED Provider Notes (Signed)
Virtual Visit via Video Note:  Alex [name]  initiated request for Telemedicine visit with [facility] team. I connected with Alex [name]  on 09/08/2018 at 2:06 PM  for a synchronized telemedicine visit using a video enabled HIPPA compliant telemedicine application. I verified that I am speaking with Alex [name]  using two identifiers. Alex Hollen C Keaira Whitehurst, PA-C  was physically located in a [facility] Urgent care site and Alex [name] was located at a different location.   The limitations of evaluation and management by telemedicine as well as the availability of in-person appointments were discussed. Patient was informed that he  may incur a bill ( including co-pay) for this virtual visit encounter. Alex [name]  expressed understanding and gave verbal consent to proceed with virtual visit.     History of Present Illness:Alex [name]  is a 33 y.o. male presents with need for medication refill of albuterol.  Patient notes that he has has recently run out of his albuterol.  He mainly has issues with asthma at nighttime.  States that he is significant improvement with breathing treatments.  He is not on daily maintenance inhalers.  Denies worsening of asthma.  Denies wheezing or shortness of breath.  Denies fevers chills or body aches.  Past Medical History:  Diagnosis Date  . ADHD (attention deficit hyperactivity disorder)   . Allergy   . Asthma   . Depressed   . ETOH abuse   . History of suicidal tendencies     No Known Allergies      Observations/Objective: Physical Exam  Constitutional: He is oriented to person, place, and time and well-developed, well-nourished, and in no distress. No distress.  HENT:  Head: Normocephalic and atraumatic.  Neck: Normal range of motion. Neck supple.  Pulmonary/Chest: Effort normal. No respiratory distress.  Speaking in full sentences Breathing comfortably, no coughing during video exam  Musculoskeletal: Normal range of motion.   Neurological: He is alert and oriented to person, place, and time.  Speech clear, face symmetric     Assessment and Plan:   ICD-10-CM   1. Mild intermittent asthma without complication  I71.24    Will refill albuterol inhaler and nebulizers.  Does not have current worsening of symptoms and does not seem to need oral steroids at this time.  Will hold off on steroid therapy.  Will have follow-up if symptoms progressing or worsening, not being controlled with albuterol alone.  Denies fevers.  Do not suspect COVID at this time.Discussed strict return precautions. Patient verbalized understanding and is agreeable with plan.   Follow Up Instructions:    I discussed the assessment and treatment plan with the patient. The patient was provided an opportunity to ask questions and all were answered. The patient agreed with the plan and demonstrated an understanding of the instructions.   The patient was advised to call back or seek an in-person evaluation if the symptoms worsen or if the condition fails to improve as anticipated.     Janith Lima, PA-C  09/08/2018 2:06 PM        Janith Lima, PA-C 09/08/18 1647

## 2018-09-08 NOTE — Discharge Instructions (Signed)
Albuterol refilled ; use as prescribed Follow up if asthma not controlled with albuterol inhaler/nebulizers

## 2018-09-11 DIAGNOSIS — F1020 Alcohol dependence, uncomplicated: Secondary | ICD-10-CM | POA: Insufficient documentation

## 2018-11-21 ENCOUNTER — Emergency Department (HOSPITAL_COMMUNITY): Payer: Self-pay

## 2018-11-21 ENCOUNTER — Encounter (HOSPITAL_COMMUNITY): Payer: Self-pay | Admitting: Emergency Medicine

## 2018-11-21 ENCOUNTER — Emergency Department (HOSPITAL_COMMUNITY)
Admission: EM | Admit: 2018-11-21 | Discharge: 2018-11-21 | Discharge status: Against Medical Advice | Payer: Self-pay | Attending: Emergency Medicine | Admitting: Emergency Medicine

## 2018-11-21 ENCOUNTER — Other Ambulatory Visit: Payer: Self-pay

## 2018-11-21 DIAGNOSIS — Y939 Activity, unspecified: Secondary | ICD-10-CM | POA: Insufficient documentation

## 2018-11-21 DIAGNOSIS — S022XXA Fracture of nasal bones, initial encounter for closed fracture: Secondary | ICD-10-CM | POA: Insufficient documentation

## 2018-11-21 DIAGNOSIS — Y92019 Unspecified place in single-family (private) house as the place of occurrence of the external cause: Secondary | ICD-10-CM | POA: Insufficient documentation

## 2018-11-21 DIAGNOSIS — Z5320 Procedure and treatment not carried out because of patient's decision for unspecified reasons: Secondary | ICD-10-CM | POA: Insufficient documentation

## 2018-11-21 DIAGNOSIS — Y999 Unspecified external cause status: Secondary | ICD-10-CM | POA: Insufficient documentation

## 2018-11-21 MED ORDER — ACETAMINOPHEN 325 MG PO TABS
650.0000 mg | ORAL_TABLET | Freq: Once | ORAL | Status: AC
  Administered 2018-11-21: 650 mg via ORAL
  Filled 2018-11-21: qty 2

## 2018-11-21 NOTE — ED Notes (Signed)
ED Provider at bedside. 
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
## 2018-11-21 NOTE — ED Notes (Signed)
Pt did not respond when called for vitals check 

## 2018-11-21 NOTE — ED Notes (Signed)
Patient transported to X-ray 

## 2018-11-21 NOTE — ED Notes (Signed)
Pt returned to waiting room waiting to be seen.
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
## 2018-11-21 NOTE — ED Notes (Signed)
Pt not found in waiting room, called x3.
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
## 2018-12-11 DIAGNOSIS — I079 Rheumatic tricuspid valve disease, unspecified: Secondary | ICD-10-CM

## 2018-12-11 HISTORY — DX: Rheumatic tricuspid valve disease, unspecified: I07.9

## 2018-12-27 DIAGNOSIS — F1190 Opioid use, unspecified, uncomplicated: Secondary | ICD-10-CM | POA: Diagnosis present

## 2018-12-27 DIAGNOSIS — F1994 Other psychoactive substance use, unspecified with psychoactive substance-induced mood disorder: Secondary | ICD-10-CM | POA: Diagnosis present

## 2018-12-27 DIAGNOSIS — F909 Attention-deficit hyperactivity disorder, unspecified type: Secondary | ICD-10-CM | POA: Diagnosis present

## 2018-12-27 DIAGNOSIS — E876 Hypokalemia: Secondary | ICD-10-CM | POA: Diagnosis not present

## 2018-12-27 DIAGNOSIS — R6520 Severe sepsis without septic shock: Secondary | ICD-10-CM | POA: Diagnosis present

## 2018-12-27 DIAGNOSIS — Z20828 Contact with and (suspected) exposure to other viral communicable diseases: Secondary | ICD-10-CM | POA: Diagnosis present

## 2018-12-27 DIAGNOSIS — Z79899 Other long term (current) drug therapy: Secondary | ICD-10-CM

## 2018-12-27 DIAGNOSIS — J45909 Unspecified asthma, uncomplicated: Secondary | ICD-10-CM | POA: Diagnosis present

## 2018-12-27 DIAGNOSIS — Z791 Long term (current) use of non-steroidal anti-inflammatories (NSAID): Secondary | ICD-10-CM

## 2018-12-27 DIAGNOSIS — Z5329 Procedure and treatment not carried out because of patient's decision for other reasons: Secondary | ICD-10-CM | POA: Diagnosis not present

## 2018-12-27 DIAGNOSIS — R748 Abnormal levels of other serum enzymes: Secondary | ICD-10-CM | POA: Diagnosis present

## 2018-12-27 DIAGNOSIS — I071 Rheumatic tricuspid insufficiency: Secondary | ICD-10-CM | POA: Diagnosis present

## 2018-12-27 DIAGNOSIS — Z590 Homelessness: Secondary | ICD-10-CM

## 2018-12-27 DIAGNOSIS — M6250 Muscle wasting and atrophy, not elsewhere classified, unspecified site: Secondary | ICD-10-CM | POA: Diagnosis present

## 2018-12-27 DIAGNOSIS — Z781 Physical restraint status: Secondary | ICD-10-CM

## 2018-12-27 DIAGNOSIS — Y712 Prosthetic and other implants, materials and accessory cardiovascular devices associated with adverse incidents: Secondary | ICD-10-CM | POA: Diagnosis not present

## 2018-12-27 DIAGNOSIS — F17210 Nicotine dependence, cigarettes, uncomplicated: Secondary | ICD-10-CM | POA: Diagnosis present

## 2018-12-27 DIAGNOSIS — F329 Major depressive disorder, single episode, unspecified: Secondary | ICD-10-CM | POA: Diagnosis present

## 2018-12-27 DIAGNOSIS — F10231 Alcohol dependence with withdrawal delirium: Secondary | ICD-10-CM | POA: Diagnosis present

## 2018-12-27 DIAGNOSIS — R7401 Elevation of levels of liver transaminase levels: Secondary | ICD-10-CM | POA: Diagnosis present

## 2018-12-27 DIAGNOSIS — A4102 Sepsis due to Methicillin resistant Staphylococcus aureus: Principal | ICD-10-CM | POA: Diagnosis present

## 2018-12-27 DIAGNOSIS — F1590 Other stimulant use, unspecified, uncomplicated: Secondary | ICD-10-CM | POA: Diagnosis present

## 2018-12-27 DIAGNOSIS — T82534A Leakage of infusion catheter, initial encounter: Secondary | ICD-10-CM | POA: Diagnosis not present

## 2018-12-27 DIAGNOSIS — Z681 Body mass index (BMI) 19 or less, adult: Secondary | ICD-10-CM

## 2018-12-27 DIAGNOSIS — I330 Acute and subacute infective endocarditis: Secondary | ICD-10-CM | POA: Diagnosis present

## 2018-12-27 DIAGNOSIS — N509 Disorder of male genital organs, unspecified: Secondary | ICD-10-CM | POA: Diagnosis present

## 2018-12-27 DIAGNOSIS — R636 Underweight: Secondary | ICD-10-CM | POA: Diagnosis present

## 2018-12-27 DIAGNOSIS — Z825 Family history of asthma and other chronic lower respiratory diseases: Secondary | ICD-10-CM

## 2018-12-27 DIAGNOSIS — D649 Anemia, unspecified: Secondary | ICD-10-CM | POA: Diagnosis present

## 2018-12-27 DIAGNOSIS — G92 Toxic encephalopathy: Secondary | ICD-10-CM | POA: Diagnosis present

## 2018-12-27 DIAGNOSIS — R945 Abnormal results of liver function studies: Secondary | ICD-10-CM | POA: Diagnosis present

## 2018-12-27 DIAGNOSIS — R739 Hyperglycemia, unspecified: Secondary | ICD-10-CM | POA: Diagnosis not present

## 2018-12-27 DIAGNOSIS — E871 Hypo-osmolality and hyponatremia: Secondary | ICD-10-CM | POA: Diagnosis present

## 2018-12-28 ENCOUNTER — Inpatient Hospital Stay (HOSPITAL_COMMUNITY)
Admission: EM | Admit: 2018-12-28 | Discharge: 2019-01-06 | DRG: 871 | Discharge status: Against Medical Advice | Payer: Self-pay | Attending: Internal Medicine | Admitting: Internal Medicine

## 2018-12-28 ENCOUNTER — Inpatient Hospital Stay (HOSPITAL_COMMUNITY): Payer: Self-pay

## 2018-12-28 ENCOUNTER — Emergency Department (HOSPITAL_COMMUNITY): Payer: Self-pay

## 2018-12-28 ENCOUNTER — Other Ambulatory Visit: Payer: Self-pay

## 2018-12-28 ENCOUNTER — Encounter (HOSPITAL_COMMUNITY): Payer: Self-pay | Admitting: Emergency Medicine

## 2018-12-28 DIAGNOSIS — F1990 Other psychoactive substance use, unspecified, uncomplicated: Secondary | ICD-10-CM

## 2018-12-28 DIAGNOSIS — I079 Rheumatic tricuspid valve disease, unspecified: Secondary | ICD-10-CM

## 2018-12-28 DIAGNOSIS — R7989 Other specified abnormal findings of blood chemistry: Secondary | ICD-10-CM

## 2018-12-28 DIAGNOSIS — G9341 Metabolic encephalopathy: Secondary | ICD-10-CM

## 2018-12-28 DIAGNOSIS — F1920 Other psychoactive substance dependence, uncomplicated: Secondary | ICD-10-CM

## 2018-12-28 DIAGNOSIS — F1994 Other psychoactive substance use, unspecified with psychoactive substance-induced mood disorder: Secondary | ICD-10-CM

## 2018-12-28 DIAGNOSIS — F10231 Alcohol dependence with withdrawal delirium: Secondary | ICD-10-CM

## 2018-12-28 DIAGNOSIS — R945 Abnormal results of liver function studies: Secondary | ICD-10-CM

## 2018-12-28 DIAGNOSIS — R748 Abnormal levels of other serum enzymes: Secondary | ICD-10-CM

## 2018-12-28 DIAGNOSIS — F17200 Nicotine dependence, unspecified, uncomplicated: Secondary | ICD-10-CM

## 2018-12-28 DIAGNOSIS — A419 Sepsis, unspecified organism: Secondary | ICD-10-CM | POA: Diagnosis present

## 2018-12-28 LAB — URINALYSIS, ROUTINE W REFLEX MICROSCOPIC
Bacteria, UA: NONE SEEN
Bilirubin Urine: NEGATIVE
Glucose, UA: NEGATIVE mg/dL
Ketones, ur: NEGATIVE mg/dL
Leukocytes,Ua: NEGATIVE
Nitrite: NEGATIVE
Protein, ur: NEGATIVE mg/dL
Specific Gravity, Urine: 1.01 (ref 1.005–1.030)
pH: 6 (ref 5.0–8.0)

## 2018-12-28 LAB — COMPREHENSIVE METABOLIC PANEL
ALT: 57 U/L — ABNORMAL HIGH (ref 0–44)
AST: 57 U/L — ABNORMAL HIGH (ref 15–41)
Albumin: 2.9 g/dL — ABNORMAL LOW (ref 3.5–5.0)
Alkaline Phosphatase: 231 U/L — ABNORMAL HIGH (ref 38–126)
Anion gap: 13 (ref 5–15)
BUN: 8 mg/dL (ref 6–20)
CO2: 22 mmol/L (ref 22–32)
Calcium: 8.7 mg/dL — ABNORMAL LOW (ref 8.9–10.3)
Chloride: 95 mmol/L — ABNORMAL LOW (ref 98–111)
Creatinine, Ser: 0.7 mg/dL (ref 0.61–1.24)
GFR calc Af Amer: >60 mL/min (ref >60)
GFR calc non Af Amer: >60 mL/min (ref >60)
Glucose, Bld: 110 mg/dL — ABNORMAL HIGH (ref 70–99)
Potassium: 3.9 mmol/L (ref 3.5–5.1)
Sodium: 130 mmol/L — ABNORMAL LOW (ref 135–145)
Total Bilirubin: 0.8 mg/dL (ref 0.3–1.2)
Total Protein: 7 g/dL (ref 6.5–8.1)

## 2018-12-28 LAB — CBC WITH DIFFERENTIAL/PLATELET
Abs Immature Granulocytes: 0.06 10*3/uL (ref 0.00–0.07)
Basophils Absolute: 0.1 10*3/uL (ref 0.0–0.1)
Basophils Relative: 0 %
Eosinophils Absolute: 0 10*3/uL (ref 0.0–0.5)
Eosinophils Relative: 0 %
HCT: 35.7 % — ABNORMAL LOW (ref 39.0–52.0)
Hemoglobin: 11.8 g/dL — ABNORMAL LOW (ref 13.0–17.0)
Immature Granulocytes: 0 %
Lymphocytes Relative: 8 %
Lymphs Abs: 1 10*3/uL (ref 0.7–4.0)
MCH: 29.4 pg (ref 26.0–34.0)
MCHC: 33.1 g/dL (ref 30.0–36.0)
MCV: 89 fL (ref 80.0–100.0)
Monocytes Absolute: 1.1 10*3/uL — ABNORMAL HIGH (ref 0.1–1.0)
Monocytes Relative: 8 %
Neutro Abs: 11.1 10*3/uL — ABNORMAL HIGH (ref 1.7–7.7)
Neutrophils Relative %: 84 %
Platelets: 208 10*3/uL (ref 150–400)
RBC: 4.01 MIL/uL — ABNORMAL LOW (ref 4.22–5.81)
RDW: 12.5 % (ref 11.5–15.5)
WBC: 13.3 10*3/uL — ABNORMAL HIGH (ref 4.0–10.5)
nRBC: 0 % (ref 0.0–0.2)

## 2018-12-28 LAB — RESPIRATORY PANEL BY PCR

## 2018-12-28 LAB — SARS CORONAVIRUS 2 BY RT PCR (HOSPITAL ORDER, PERFORMED IN ~~LOC~~ HOSPITAL LAB): SARS Coronavirus 2: NEGATIVE

## 2018-12-28 LAB — PROTIME-INR
INR: 1.2 (ref 0.8–1.2)
Prothrombin Time: 15.5 seconds — ABNORMAL HIGH (ref 11.4–15.2)

## 2018-12-28 LAB — LACTIC ACID, PLASMA
Lactic Acid, Venous: 1 mmol/L (ref 0.5–1.9)
Lactic Acid, Venous: 1.3 mmol/L (ref 0.5–1.9)

## 2018-12-28 LAB — RAPID URINE DRUG SCREEN, HOSP PERFORMED
Amphetamines: POSITIVE — AB
Barbiturates: NOT DETECTED
Benzodiazepines: NOT DETECTED
Cocaine: NOT DETECTED
Opiates: POSITIVE — AB
Tetrahydrocannabinol: NOT DETECTED

## 2018-12-28 LAB — HEPATITIS PANEL, ACUTE
HCV Ab: NONREACTIVE
Hep A IgM: NONREACTIVE
Hep B C IgM: NONREACTIVE
Hepatitis B Surface Ag: NONREACTIVE

## 2018-12-28 LAB — CK: Total CK: 52 U/L (ref 49–397)

## 2018-12-28 LAB — HIV ANTIBODY (ROUTINE TESTING W REFLEX): HIV Screen 4th Generation wRfx: NONREACTIVE

## 2018-12-28 LAB — SARS CORONAVIRUS 2 (TAT 6-24 HRS): SARS Coronavirus 2: NEGATIVE

## 2018-12-28 LAB — SEDIMENTATION RATE: Sed Rate: 105 mm/hr — ABNORMAL HIGH (ref 0–16)

## 2018-12-28 LAB — C-REACTIVE PROTEIN: CRP: 19.6 mg/dL — ABNORMAL HIGH (ref <1.0)

## 2018-12-28 LAB — MRSA PCR SCREENING: MRSA by PCR: POSITIVE — AB

## 2018-12-28 LAB — ETHANOL: Alcohol, Ethyl (B): <10 mg/dL (ref <10)

## 2018-12-28 LAB — APTT: aPTT: 38 seconds — ABNORMAL HIGH (ref 24–36)

## 2018-12-28 MED ORDER — ALBUTEROL SULFATE HFA 108 (90 BASE) MCG/ACT IN AERS: 1.0000 | INHALATION_SPRAY | RESPIRATORY_TRACT | Status: DC | PRN

## 2018-12-28 MED ORDER — POLYVINYL ALCOHOL 1.4 % OP SOLN
1.0000 [drp] | Freq: Three times a day (TID) | OPHTHALMIC | Status: DC | PRN
  Filled 2018-12-28: qty 15

## 2018-12-28 MED ORDER — LORAZEPAM 2 MG/ML IJ SOLN
5.0000 mg | Freq: Once | INTRAMUSCULAR | Status: AC
  Administered 2018-12-28: 15:00:00 5 mg via INTRAVENOUS

## 2018-12-28 MED ORDER — THIAMINE HCL 100 MG/ML IJ SOLN
Freq: Once | INTRAVENOUS | Status: AC
  Administered 2018-12-28: 09:00:00 via INTRAVENOUS
  Filled 2018-12-28: qty 1000

## 2018-12-28 MED ORDER — ALBUTEROL SULFATE (2.5 MG/3ML) 0.083% IN NEBU
2.5000 mg | INHALATION_SOLUTION | RESPIRATORY_TRACT | Status: DC | PRN
  Administered 2018-12-30 – 2019-01-02 (×3): 2.5 mg via RESPIRATORY_TRACT
  Filled 2018-12-28 (×2): qty 3

## 2018-12-28 MED ORDER — VANCOMYCIN HCL IN DEXTROSE 1-5 GM/200ML-% IV SOLN
1000.0000 mg | Freq: Two times a day (BID) | INTRAVENOUS | Status: DC
  Administered 2018-12-28 – 2018-12-30 (×5): 1000 mg via INTRAVENOUS
  Filled 2018-12-28 (×4): qty 200

## 2018-12-28 MED ORDER — LORAZEPAM 2 MG/ML IJ SOLN: 0.0000 mg | Freq: Two times a day (BID) | INTRAMUSCULAR | Status: DC

## 2018-12-28 MED ORDER — HALOPERIDOL LACTATE 5 MG/ML IJ SOLN
5.0000 mg | Freq: Four times a day (QID) | INTRAMUSCULAR | Status: DC | PRN
  Administered 2018-12-30 – 2018-12-31 (×3): 5 mg via INTRAVENOUS
  Filled 2018-12-28 (×3): qty 1

## 2018-12-28 MED ORDER — CHLORHEXIDINE GLUCONATE CLOTH 2 % EX PADS
6.0000 | MEDICATED_PAD | Freq: Every day | CUTANEOUS | Status: AC
  Administered 2018-12-29 – 2019-01-02 (×4): 6 via TOPICAL

## 2018-12-28 MED ORDER — HYPROMELLOSE (GONIOSCOPIC) 2.5 % OP SOLN: 1.0000 [drp] | Freq: Three times a day (TID) | OPHTHALMIC | Status: DC | PRN

## 2018-12-28 MED ORDER — LORAZEPAM 2 MG/ML IJ SOLN
INTRAMUSCULAR | Status: AC
  Filled 2018-12-28: qty 3

## 2018-12-28 MED ORDER — HYDROCODONE-ACETAMINOPHEN 5-325 MG PO TABS
1.0000 | ORAL_TABLET | ORAL | Status: DC | PRN
  Administered 2018-12-29 – 2019-01-04 (×15): 2 via ORAL
  Administered 2019-01-04: 1 via ORAL
  Administered 2019-01-04: 2 via ORAL
  Administered 2019-01-05 (×3): 1 via ORAL
  Filled 2018-12-28 (×7): qty 2
  Filled 2018-12-28: qty 1
  Filled 2018-12-28 (×2): qty 2
  Filled 2018-12-28: qty 1
  Filled 2018-12-28 (×2): qty 2
  Filled 2018-12-28: qty 1
  Filled 2018-12-28 (×6): qty 2

## 2018-12-28 MED ORDER — ORAL CARE MOUTH RINSE
15.0000 mL | Freq: Two times a day (BID) | OROMUCOSAL | Status: DC
  Administered 2018-12-28 – 2019-01-06 (×16): 15 mL via OROMUCOSAL

## 2018-12-28 MED ORDER — ENOXAPARIN SODIUM 40 MG/0.4ML ~~LOC~~ SOLN
40.0000 mg | SUBCUTANEOUS | Status: DC
  Administered 2018-12-28 – 2019-01-05 (×9): 40 mg via SUBCUTANEOUS
  Filled 2018-12-28 (×9): qty 0.4

## 2018-12-28 MED ORDER — ACETAMINOPHEN 650 MG RE SUPP: 650.0000 mg | Freq: Four times a day (QID) | RECTAL | Status: DC | PRN

## 2018-12-28 MED ORDER — LORAZEPAM 2 MG/ML IJ SOLN: 10.0000 mg | Freq: Once | INTRAMUSCULAR | Status: DC

## 2018-12-28 MED ORDER — HALOPERIDOL LACTATE 5 MG/ML IJ SOLN
INTRAMUSCULAR | Status: AC
  Filled 2018-12-28: qty 1

## 2018-12-28 MED ORDER — LORAZEPAM 2 MG/ML IJ SOLN
INTRAMUSCULAR | Status: AC
  Administered 2018-12-28: 2 mg
  Filled 2018-12-28: qty 1

## 2018-12-28 MED ORDER — SODIUM CHLORIDE 0.9 % IV SOLN
INTRAVENOUS | Status: DC
  Administered 2018-12-28 – 2019-01-01 (×8): via INTRAVENOUS

## 2018-12-28 MED ORDER — CHLORHEXIDINE GLUCONATE CLOTH 2 % EX PADS
6.0000 | MEDICATED_PAD | Freq: Every day | CUTANEOUS | Status: DC
  Administered 2018-12-28: 6 via TOPICAL

## 2018-12-28 MED ORDER — ACETAMINOPHEN 500 MG PO TABS
1000.0000 mg | ORAL_TABLET | Freq: Once | ORAL | Status: AC
  Administered 2018-12-28: 1000 mg via ORAL
  Filled 2018-12-28: qty 2

## 2018-12-28 MED ORDER — SODIUM CHLORIDE 0.9 % IV SOLN
2.0000 g | INTRAVENOUS | Status: AC
  Administered 2018-12-28: 08:00:00 2 g via INTRAVENOUS
  Filled 2018-12-28: qty 2

## 2018-12-28 MED ORDER — ONDANSETRON HCL 4 MG/2ML IJ SOLN: 4.0000 mg | Freq: Four times a day (QID) | INTRAMUSCULAR | Status: DC | PRN

## 2018-12-28 MED ORDER — NICOTINE 21 MG/24HR TD PT24
21.0000 mg | MEDICATED_PATCH | Freq: Every day | TRANSDERMAL | Status: DC
  Administered 2018-12-29 – 2019-01-06 (×9): 21 mg via TRANSDERMAL
  Filled 2018-12-28 (×9): qty 1

## 2018-12-28 MED ORDER — LORAZEPAM 1 MG PO TABS: 0.0000 mg | ORAL_TABLET | Freq: Four times a day (QID) | ORAL | Status: DC

## 2018-12-28 MED ORDER — HALOPERIDOL LACTATE 5 MG/ML IJ SOLN
5.0000 mg | Freq: Once | INTRAMUSCULAR | Status: AC
  Administered 2018-12-28: 5 mg via INTRAVENOUS

## 2018-12-28 MED ORDER — LORAZEPAM 2 MG/ML IJ SOLN
0.0000 mg | Freq: Four times a day (QID) | INTRAMUSCULAR | Status: DC
  Administered 2018-12-28 – 2018-12-29 (×3): 2 mg via INTRAVENOUS
  Filled 2018-12-28: qty 2
  Filled 2018-12-28 (×3): qty 1
  Filled 2018-12-28: qty 2
  Filled 2018-12-28: qty 1

## 2018-12-28 MED ORDER — SODIUM CHLORIDE 0.9 % IV BOLUS
1000.0000 mL | Freq: Once | INTRAVENOUS | Status: AC
  Administered 2018-12-28: 1000 mL via INTRAVENOUS

## 2018-12-28 MED ORDER — ACETAMINOPHEN 325 MG PO TABS
650.0000 mg | ORAL_TABLET | Freq: Four times a day (QID) | ORAL | Status: DC | PRN
  Administered 2018-12-30 – 2018-12-31 (×2): 650 mg via ORAL
  Filled 2018-12-28 (×2): qty 2

## 2018-12-28 MED ORDER — MUPIROCIN 2 % EX OINT
1.0000 "application " | TOPICAL_OINTMENT | Freq: Two times a day (BID) | CUTANEOUS | Status: AC
  Administered 2018-12-28 – 2019-01-01 (×10): 1 via NASAL
  Filled 2018-12-28: qty 22

## 2018-12-28 MED ORDER — ONDANSETRON HCL 4 MG PO TABS: 4.0000 mg | ORAL_TABLET | Freq: Four times a day (QID) | ORAL | Status: DC | PRN

## 2018-12-28 MED ORDER — VANCOMYCIN HCL 10 G IV SOLR
1250.0000 mg | INTRAVENOUS | Status: AC
  Administered 2018-12-28: 1250 mg via INTRAVENOUS
  Filled 2018-12-28: qty 1250

## 2018-12-28 MED ORDER — PROMETHAZINE HCL 25 MG/ML IJ SOLN
25.0000 mg | Freq: Once | INTRAMUSCULAR | Status: AC
  Administered 2018-12-28: 07:00:00 25 mg via INTRAVENOUS
  Filled 2018-12-28: qty 1

## 2018-12-28 MED ORDER — ALBUTEROL SULFATE HFA 108 (90 BASE) MCG/ACT IN AERS
2.0000 | INHALATION_SPRAY | RESPIRATORY_TRACT | Status: DC
  Filled 2018-12-28: qty 6.7

## 2018-12-28 MED ORDER — SENNOSIDES-DOCUSATE SODIUM 8.6-50 MG PO TABS: 1.0000 | ORAL_TABLET | Freq: Every evening | ORAL | Status: DC | PRN

## 2018-12-28 MED ORDER — SODIUM CHLORIDE 0.9 % IV SOLN
2.0000 g | Freq: Three times a day (TID) | INTRAVENOUS | Status: DC
  Administered 2018-12-28 – 2018-12-29 (×4): 2 g via INTRAVENOUS
  Filled 2018-12-28 (×4): qty 2

## 2018-12-28 MED ORDER — LORAZEPAM 2 MG/ML IJ SOLN
1.0000 mg | Freq: Once | INTRAMUSCULAR | Status: AC
  Administered 2018-12-28: 07:00:00 1 mg via INTRAVENOUS
  Filled 2018-12-28: qty 1

## 2018-12-28 MED ORDER — LORAZEPAM 2 MG/ML IJ SOLN
5.0000 mg | INTRAMUSCULAR | Status: DC | PRN
  Administered 2018-12-28 – 2018-12-29 (×2): 5 mg via INTRAVENOUS
  Filled 2018-12-28 (×2): qty 3

## 2018-12-28 MED ORDER — LORAZEPAM 1 MG PO TABS: 0.0000 mg | ORAL_TABLET | Freq: Two times a day (BID) | ORAL | Status: DC

## 2018-12-28 MED ORDER — CHLORDIAZEPOXIDE HCL 25 MG PO CAPS
25.0000 mg | ORAL_CAPSULE | Freq: Three times a day (TID) | ORAL | Status: DC
  Administered 2018-12-28: 25 mg via ORAL
  Filled 2018-12-28: qty 1

## 2018-12-28 MED ORDER — SODIUM CHLORIDE 0.9 % IV SOLN
25.0000 mg | Freq: Once | INTRAVENOUS | Status: AC
  Administered 2018-12-28: 25 mg via INTRAVENOUS
  Filled 2018-12-28: qty 1

## 2018-12-28 MED ORDER — IBUPROFEN 200 MG PO TABS
600.0000 mg | ORAL_TABLET | Freq: Once | ORAL | Status: AC
  Administered 2018-12-28: 08:00:00 600 mg via ORAL
  Filled 2018-12-28: qty 3

## 2018-12-28 MED ORDER — CHLORDIAZEPOXIDE HCL 25 MG PO CAPS
50.0000 mg | ORAL_CAPSULE | Freq: Once | ORAL | Status: AC
  Administered 2018-12-28: 50 mg via ORAL
  Filled 2018-12-28 (×2): qty 2

## 2018-12-28 NOTE — Progress Notes (Signed)
A consult was received from an ED physician for Vancomycin and Cefepime per pharmacy dosing for sepsis.    The patient's profile has been reviewed for ht/wt/allergies/indication/available labs.    A one time order has been placed for Vancomycin 1250mg  and Cefepime 2gm IV.    Further antibiotics/pharmacy consults should be ordered by admitting physician if indicated.                       Thank you, Everette Rank, PharmD 12/28/2018  7:15 AM

## 2018-12-28 NOTE — ED Notes (Signed)
Pt gone to xray

## 2018-12-28 NOTE — ED Provider Notes (Addendum)
[facility]-EMERGENCY DEPT Provider Note   CSN: [id number] Arrival date & time: 12/27/18  2319     History   Chief Complaint Chief Complaint  Patient presents with   Code Sepsis   Opioid Problem   Alcohol Problem    HPI Alex [name] is a 33 y.o. male with a history of asthma, alcohol abuse, and IV drug use who presents to the emergency department with a chief complaint of generalized body aches.  The patient endorses diffuse joint and muscle pain that began 24 hours ago.  He states that he feels as if he is hurting throughout his entire body.  He reports that he used IV heroin just prior to arrival to see if the pain would improve.  He reports that he is only been using IV methamphetamine and heroin for the last month and injects in his left upper extremity.  Last meth use was around a week ago.  He also reports alcohol dependence, but reports that he has not consumed any alcohol since yesterday.  He reports one episode of vomiting just prior to arrival.  States he was prompted to come to the ER and his sister checked his temperature just prior to arrival and it was 100 degrees.  He reports that he has been having chills and subjective fever for the last few days.  He also reports worsening shortness of breath.  States that he is having chest and abdominal pain, but states that this does not hurt worse than any of his other joints or muscles.  No diarrhea, constipation, headache, dizziness, lightheadedness, visual changes.   No treatment other than IV heroin use prior to arrival.  He also notes a 10 pound weight loss over the last 2 months.  He reports he was not trying to lose weight, but suspects it is secondary to methamphetamine use and poor dietary habits as he has been living in and out of hotels for the last few months.  Reports his last HIV screening was negative.  He is not sexually active at the moment.  Alex [name] was evaluated in Emergency  Department on 01/05/2019 for the symptoms described in the history of present illness. He was evaluated in the context of the global COVID-19 pandemic, which necessitated consideration that the patient might be at risk for infection with the SARS-CoV-2 virus that causes COVID-19. Institutional protocols and algorithms that pertain to the evaluation of patients at risk for COVID-19 are in a state of rapid change based on information released by regulatory bodies including the CDC and federal and state organizations. These policies and algorithms were followed during the patient's care in the ED.      The history is provided by the patient. No language interpreter was used.    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 Authorizing Provider  albuterol (PROVENTIL HFA) 108 (90 Base) MCG/ACT inhaler Inhale 1-2 puffs into the lungs every 4 (four) hours as needed for wheezing or shortness of  breath. 09/08/18  Yes Wieters, Hallie C, PA-C  albuterol (PROVENTIL) (2.5 MG/3ML) 0.083% nebulizer solution Take 3 mLs (2.5 mg total) by nebulization every 6 (six) hours as needed for wheezing or shortness of breath. 09/08/18  Yes Wieters, Hallie C, PA-C  hydroxypropyl methylcellulose / hypromellose (ISOPTO TEARS / GONIOVISC) 2.5 % ophthalmic solution Place 1 drop into both eyes 3 (three) times daily as needed for dry eyes.   Yes [provider]  naproxen sodium (ALEVE) 220 MG tablet Take 440 mg by mouth 2 (two) times daily as needed (pain).   Yes [provider]  ipratropium-albuterol (DUONEB) 0.5-2.5 (3) MG/3ML SOLN Take 3 mLs by nebulization every 6 (six) hours as needed (wheezing). Patient not taking: Reported on 12/28/2018 04/14/18 12/28/18  Elpidio Anis, PA-C  montelukast (SINGULAIR) 10 MG tablet Take 1 tablet (10 mg total) by mouth at bedtime. Patient not taking: Reported on 12/28/2018 07/25/17 12/28/18  Georgetta Haber, NP  traZODone (DESYREL) 50 MG tablet Take 1 tablet (50 mg total) by mouth at bedtime and may repeat dose one time if needed. Patient not taking: Reported on 07/05/2018 04/29/16 12/28/18  Oneta Rack, NP    Family History Family History  Problem Relation Age of Onset   COPD Other     Social History Social History   Tobacco Use   Smoking status: Current Every Day Smoker    Packs/day: 1.00    Years: 10.00    Pack years: 10.00    Types: Cigarettes   Smokeless tobacco: Never Used  Substance Use Topics   Alcohol use: Yes    Alcohol/week: 12.0 standard drinks    Types: 12 Cans of beer per week    Comment: 12 beers daily   Drug use: Yes    Frequency: 6.0 times per week    Types: Marijuana, Cocaine, Heroin, Fentanyl    Comment: daily use cocaine (snort)     Allergies   Patient has no known allergies.   Review of Systems Review of Systems  Constitutional: Positive for chills and fever. Negative for appetite change.  HENT: Negative  for congestion and sore throat.   Eyes: Negative for visual disturbance.  Respiratory: Positive for shortness of breath. Negative for cough and wheezing.   Cardiovascular: Negative for chest pain, palpitations and leg swelling.  Gastrointestinal: Negative for abdominal pain, diarrhea, nausea and vomiting.  Genitourinary: Negative for dysuria, frequency, genital sores, penile pain and urgency.  Musculoskeletal: Positive for myalgias. Negative for back pain.  Skin: Negative for rash.  Allergic/Immunologic: Negative for immunocompromised state.  Neurological: Negative for dizziness, seizures, syncope, weakness and headaches.  Psychiatric/Behavioral: Negative for confusion.     Physical Exam Updated Vital Signs BP 125/70 (BP Location: Right Arm)    Pulse 91    Temp 97.6 F (36.4 C) (Oral)    Resp 20    Ht 6' (1.829 m)    Wt 54.3 kg    SpO2 100%    BMI 16.24 kg/m  Physical Exam Vitals signs and nursing note reviewed.  Constitutional:      Appearance: He is well-developed. He is not ill-appearing or toxic-appearing.     Comments: Cachectic male. Uncomfortable and writhing around on the bed  HENT:     Head: Normocephalic.  Eyes:     Conjunctiva/sclera: Conjunctivae normal.  Neck:     Musculoskeletal: Neck supple.  Cardiovascular:     Rate and Rhythm: Regular rhythm. Tachycardia present.     Pulses: Normal pulses.     Heart sounds: Normal heart sounds. No murmur. No gallop.      Comments: No murmurs rubs or gallops. Pulmonary:     Effort: Pulmonary effort is normal. No respiratory distress.     Breath sounds: No stridor. Wheezing present. No rhonchi or rales.     Comments: Wheezes auscultated in the right lower lobe.  Lungs are otherwise clear to auscultation bilaterally. Chest:     Chest wall: No tenderness.  Abdominal:     General: There is no distension.     Palpations: Abdomen is soft.     Comments: Diffusely tender to palpation throughout the abdomen.  No focal tenderness.   Abdomen is mildly distended.  Musculoskeletal:     Comments: Full active and passive range of motion of all joints.  No focal overlying redness or warmth noted to the joints.  Diffusely tender to palpation throughout his entire body.  Skin:    General: Skin is warm and dry.     Capillary Refill: Capillary refill takes less than 2 seconds.     Comments: Scabs are noted throughout the patient's body.  Track marks noted on the left upper extremity.  Skin is warm to the touch.  Neurological:     Mental Status: He is alert.     Comments: Rhythmic shaking noted to the left lower foot.  Patient states this is baseline and has been present for all of his life.  Psychiatric:        Attention and Perception: Attention and perception normal. He does not perceive auditory or visual hallucinations.        Mood and Affect: Mood is anxious.        Behavior: Behavior normal.        Thought Content: Thought content does not include homicidal or suicidal ideation. Thought content does not include homicidal or suicidal plan.      ED Treatments / Results  Labs (all labs ordered are listed, but only abnormal results are displayed) Labs Reviewed  CULTURE, BLOOD (ROUTINE X 2) - Abnormal; Notable for the following components:      Result Value   Culture METHICILLIN RESISTANT STAPHYLOCOCCUS AUREUS (*)    All other components within normal limits  CULTURE, BLOOD (ROUTINE X 2) - Abnormal; Notable for the following components:   Culture   (*)    Value: STAPHYLOCOCCUS AUREUS SUSCEPTIBILITIES PERFORMED ON PREVIOUS CULTURE WITHIN THE LAST 5 DAYS. Performed at [facility], [address] [place], [place] [id number]    All other components within normal limits  URINE CULTURE - Abnormal; Notable for the following components:   Culture   (*)    Value: 30,000 COLONIES/mL METHICILLIN RESISTANT STAPHYLOCOCCUS AUREUS   Organism ID, Bacteria METHICILLIN RESISTANT STAPHYLOCOCCUS AUREUS (*)    All other  components within normal limits  MRSA PCR SCREENING - Abnormal; Notable for the following components:   MRSA by PCR POSITIVE (*)    All other components within normal limits  BLOOD CULTURE ID PANEL (REFLEXED) - Abnormal; Notable for the following components:   Staphylococcus species DETECTED (*)    Staphylococcus aureus (BCID) DETECTED (*)    Methicillin resistance DETECTED (*)    All other components within normal limits  CBC WITH DIFFERENTIAL/PLATELET - Abnormal; Notable for the following components:   WBC 13.3 (*)    RBC 4.01 (*)    Hemoglobin 11.8 (*)    HCT 35.7 (*)    Neutro Abs 11.1 (*)    Monocytes Absolute 1.1 (*)    All other components within normal limits  COMPREHENSIVE METABOLIC PANEL - Abnormal; Notable for the following components:   Sodium 130 (*)    Chloride 95 (*)    Glucose, Bld 110 (*)    Calcium 8.7 (*)    Albumin 2.9 (*)    AST 57 (*)    ALT 57 (*)    Alkaline Phosphatase 231 (*)    All other components within normal limits  URINALYSIS, ROUTINE W REFLEX MICROSCOPIC - Abnormal; Notable for the following components:   Hgb urine dipstick SMALL (*)    All other components within normal limits  RAPID URINE DRUG SCREEN, HOSP PERFORMED - Abnormal; Notable for the following components:   Opiates POSITIVE (*)    Amphetamines POSITIVE (*)    All other components within normal limits  APTT - Abnormal; Notable for the following components:   aPTT 38 (*)    All other components within normal limits  PROTIME-INR - Abnormal; Notable for the following components:   Prothrombin Time 15.5 (*)    All other components within normal limits  C-REACTIVE PROTEIN - Abnormal; Notable for the following components:   CRP 19.6 (*)    All other components within normal limits  SEDIMENTATION RATE - Abnormal; Notable for the following components:   Sed Rate 105 (*)    All other components within normal limits  CBC - Abnormal; Notable for the following components:   WBC 14.8 (*)      RBC 4.15 (*)    Hemoglobin 12.2 (*)    HCT 35.5 (*)    All other components within normal limits  COMPREHENSIVE METABOLIC PANEL - Abnormal; Notable for the following components:   CO2 18 (*)    Calcium 8.4 (*)    Total Protein 6.0 (*)    Albumin 2.5 (*)    Alkaline Phosphatase 166 (*)    All other components within normal limits  HSV(HERPES SIMPLEX VRS) I + II AB-IGM - Abnormal; Notable for the following components:   HSVI/II Comb IgM 1.13 (*)    All other components within normal limits  HSV(HERPES SIMPLEX VRS) I + II AB-IGG - Abnormal; Notable for the following components:   HSV 1 Glycoprotein G Ab, IgG 55.40 (*)    HSV 2 Glycoprotein G Ab, IgG 1.67 (*)    All other components within normal limits  HSV-2 IGG SUPPLEMENTAL TEST - Abnormal; Notable for the following components:   HSV-2 IgG Supplemental Test Positive (*)    All other components within normal limits  GLUCOSE, CAPILLARY - Abnormal; Notable for the following components:   Glucose-Capillary 112 (*)    All other components within normal limits  BASIC METABOLIC PANEL - Abnormal; Notable for the following components:   Potassium 3.1 (*)    Glucose, Bld 146 (*)    Creatinine, Ser 0.50 (*)    Calcium 7.9 (*)    All other components within normal limits  CBC - Abnormal; Notable  for the following components:   RBC 3.72 (*)    Hemoglobin 10.9 (*)    HCT 32.7 (*)    All other components within normal limits  GLUCOSE, CAPILLARY - Abnormal; Notable for the following components:   Glucose-Capillary 131 (*)    All other components within normal limits  VANCOMYCIN, TROUGH - Abnormal; Notable for the following components:   Vancomycin Tr 5 (*)    All other components within normal limits  GLUCOSE, CAPILLARY - Abnormal; Notable for the following components:   Glucose-Capillary 119 (*)    All other components within normal limits  GLUCOSE, CAPILLARY - Abnormal; Notable for the following components:   Glucose-Capillary 115  (*)    All other components within normal limits  HEMOGLOBIN A1C - Abnormal; Notable for the following components:   Hgb A1c MFr Bld 6.1 (*)    All other components within normal limits  IRON AND TIBC - Abnormal; Notable for the following components:   Iron 16 (*)    TIBC 184 (*)    Saturation Ratios 9 (*)    All other components within normal limits  RETICULOCYTES - Abnormal; Notable for the following components:   RBC. 3.37 (*)    Retic Count, Absolute 10.4 (*)    All other components within normal limits  COMPREHENSIVE METABOLIC PANEL - Abnormal; Notable for the following components:   Glucose, Bld 125 (*)    BUN 5 (*)    Creatinine, Ser 0.51 (*)    Calcium 8.2 (*)    Total Protein 5.9 (*)    Albumin 2.2 (*)    Alkaline Phosphatase 151 (*)    All other components within normal limits  CBC WITH DIFFERENTIAL/PLATELET - Abnormal; Notable for the following components:   RBC 3.37 (*)    Hemoglobin 9.8 (*)    HCT 29.8 (*)    All other components within normal limits  GLUCOSE, CAPILLARY - Abnormal; Notable for the following components:   Glucose-Capillary 107 (*)    All other components within normal limits  GLUCOSE, CAPILLARY - Abnormal; Notable for the following components:   Glucose-Capillary 101 (*)    All other components within normal limits  VANCOMYCIN, PEAK - Abnormal; Notable for the following components:   Vancomycin Pk 15 (*)    All other components within normal limits  CBC WITH DIFFERENTIAL/PLATELET - Abnormal; Notable for the following components:   WBC 10.6 (*)    RBC 3.63 (*)    Hemoglobin 10.6 (*)    HCT 32.8 (*)    Monocytes Absolute 1.1 (*)    All other components within normal limits  COMPREHENSIVE METABOLIC PANEL - Abnormal; Notable for the following components:   Creatinine, Ser 0.56 (*)    Calcium 8.3 (*)    Total Protein 6.2 (*)    Albumin 2.2 (*)    Alkaline Phosphatase 155 (*)    Total Bilirubin 0.2 (*)    All other components within normal  limits  GLUCOSE, CAPILLARY - Abnormal; Notable for the following components:   Glucose-Capillary 107 (*)    All other components within normal limits  VANCOMYCIN, TROUGH - Abnormal; Notable for the following components:   Vancomycin Tr 8 (*)    All other components within normal limits  GLUCOSE, CAPILLARY - Abnormal; Notable for the following components:   Glucose-Capillary 126 (*)    All other components within normal limits  GLUCOSE, CAPILLARY - Abnormal; Notable for the following components:   Glucose-Capillary 103 (*)  All other components within normal limits  CBC WITH DIFFERENTIAL/PLATELET - Abnormal; Notable for the following components:   RBC 3.46 (*)    Hemoglobin 10.1 (*)    HCT 31.0 (*)    All other components within normal limits  COMPREHENSIVE METABOLIC PANEL - Abnormal; Notable for the following components:   Glucose, Bld 122 (*)    Creatinine, Ser 0.51 (*)    Calcium 8.5 (*)    Total Protein 6.4 (*)    Albumin 2.4 (*)    Alkaline Phosphatase 150 (*)    Total Bilirubin 0.2 (*)    All other components within normal limits  BASIC METABOLIC PANEL - Abnormal; Notable for the following components:   Glucose, Bld 112 (*)    Calcium 8.5 (*)    All other components within normal limits  CBC - Abnormal; Notable for the following components:   RBC 3.47 (*)    Hemoglobin 10.2 (*)    HCT 31.9 (*)    Platelets 446 (*)    All other components within normal limits  GLUCOSE, CAPILLARY - Abnormal; Notable for the following components:   Glucose-Capillary 117 (*)    All other components within normal limits  RAPID URINE DRUG SCREEN, HOSP PERFORMED - Abnormal; Notable for the following components:   Opiates POSITIVE (*)    Benzodiazepines POSITIVE (*)    All other components within normal limits  COMPREHENSIVE METABOLIC PANEL - Abnormal; Notable for the following components:   Glucose, Bld 105 (*)    Albumin 2.9 (*)    ALT 59 (*)    Alkaline Phosphatase 180 (*)    All  other components within normal limits  CBC WITH DIFFERENTIAL/PLATELET - Abnormal; Notable for the following components:   WBC 11.5 (*)    RBC 3.93 (*)    Hemoglobin 11.5 (*)    HCT 35.9 (*)    Platelets 548 (*)    Neutro Abs 8.0 (*)    All other components within normal limits  RAPID URINE DRUG SCREEN, HOSP PERFORMED - Abnormal; Notable for the following components:   Opiates POSITIVE (*)    Benzodiazepines POSITIVE (*)    Amphetamines POSITIVE (*)    All other components within normal limits  SARS CORONAVIRUS 2 (TAT 6-24 HRS)  RESPIRATORY PANEL BY PCR  SARS CORONAVIRUS 2 BY RT PCR (HOSPITAL ORDER, PERFORMED IN [facility])  CULTURE, BLOOD (ROUTINE X 2)  CULTURE, BLOOD (ROUTINE X 2)  CULTURE, BLOOD (ROUTINE X 2)  ETHANOL  CK  LACTIC ACID, PLASMA  LACTIC ACID, PLASMA  HIV ANTIBODY (ROUTINE TESTING W REFLEX)  HEPATITIS PANEL, ACUTE  MAGNESIUM  PHOSPHORUS  RPR  GLUCOSE, CAPILLARY  GLUCOSE, CAPILLARY  GLUCOSE, CAPILLARY  VANCOMYCIN, PEAK  MAGNESIUM  GLUCOSE, CAPILLARY  MAGNESIUM  VITAMIN B12  FOLATE  FERRITIN  PHOSPHORUS  GLUCOSE, CAPILLARY  RPR  MAGNESIUM  PHOSPHORUS  GLUCOSE, CAPILLARY  MAGNESIUM  PHOSPHORUS  MAGNESIUM  PHOSPHORUS  VANCOMYCIN, PEAK  VANCOMYCIN, TROUGH  MAGNESIUM  PHOSPHORUS  HIV4GL SAVE TUBE  CBC  CBC  CBC  COMPREHENSIVE METABOLIC PANEL  CBC WITH DIFFERENTIAL/PLATELET  MAGNESIUM  PHOSPHORUS  GC/CHLAMYDIA PROBE AMP ([place]) NOT AT [facility]  GC/CHLAMYDIA PROBE AMP ([place]) NOT AT [facility]    EKG EKG Interpretation  Date/Time:  Sunday December 28 2018 07:27:48 EDT Ventricular Rate:  96 PR Interval:    QRS Duration: 81 QT Interval:  330 QTC Calculation: 417 R Axis:   85 Text Interpretation:  Sinus rhythm Anteroseptal infarct,  age indeterminate No acute changes No significant change since last tracing Confirmed by Derwood Kaplan [phone]) on 12/29/2018 5:28:21 PM   Radiology No results  found.  Procedures .Critical Care Performed by: Barkley Boards, PA-C Authorized by: Barkley Boards, PA-C   Critical care provider statement:    Critical care time (minutes):  40   Critical care time was exclusive of:  Separately billable procedures and treating other patients and teaching time   Critical care was necessary to treat or prevent imminent or life-threatening deterioration of the following conditions:  Sepsis   Critical care was time spent personally by me on the following activities:  Ordering and performing treatments and interventions, ordering and review of laboratory studies, ordering and review of radiographic studies, pulse oximetry, re-evaluation of patient's condition, review of old charts, obtaining history from patient or surrogate, examination of patient and evaluation of patient's response to treatment   I assumed direction of critical care for this patient from another provider in my specialty: no     (including critical care time)  Medications Ordered in ED Medications  enoxaparin (LOVENOX) injection 40 mg (40 mg Subcutaneous Given 01/05/19 2016)  acetaminophen (TYLENOL) tablet 650 mg ( Oral MAR Unhold 12/31/18 1440)    Or  acetaminophen (TYLENOL) suppository 650 mg ( Rectal MAR Unhold 12/31/18 1440)  HYDROcodone-acetaminophen (NORCO/VICODIN) 5-325 MG per tablet 1-2 tablet (1 tablet Oral Given 01/05/19 1826)  senna-docusate (Senokot-S) tablet 1 tablet ( Oral MAR Unhold 12/31/18 1440)  ondansetron (ZOFRAN) tablet 4 mg ( Oral MAR Unhold 12/31/18 1440)    Or  ondansetron (ZOFRAN) injection 4 mg ( Intravenous MAR Unhold 12/31/18 1440)  albuterol (PROVENTIL) (2.5 MG/3ML) 0.083% nebulizer solution 2.5 mg (2.5 mg Nebulization Given 01/02/19 0408)  polyvinyl alcohol (LIQUIFILM TEARS) 1.4 % ophthalmic solution 1 drop ( Both Eyes MAR Unhold 12/31/18 1440)  nicotine (NICODERM CQ - dosed in mg/24 hours) patch 21 mg (21 mg Transdermal Patch Applied 01/05/19 1100)   haloperidol lactate (HALDOL) injection 5 mg ( Intravenous MAR Unhold 12/31/18 1440)  Chlorhexidine Gluconate Cloth 2 % PADS 6 each (6 each Topical Given 01/02/19 0545)  MEDLINE mouth rinse (15 mLs Mouth Rinse Given 01/05/19 2017)  chlordiazePOXIDE (LIBRIUM) capsule 10 mg (10 mg Oral Given 01/05/19 2017)  LORazepam (ATIVAN) injection 2 mg (2 mg Intravenous Given 01/05/19 1826)  LORazepam (ATIVAN) tablet 1-4 mg (1 mg Oral Given 01/01/19 0644)    Or  LORazepam (ATIVAN) injection 1-4 mg ( Intravenous See Alternative 01/01/19 0644)  folic acid (FOLVITE) tablet 1 mg (1 mg Oral Given 01/05/19 1100)  multivitamin with minerals tablet 1 tablet (1 tablet Oral Given 01/05/19 1100)  thiamine (VITAMIN B-1) tablet 100 mg (100 mg Oral Given 01/05/19 1100)    Or  thiamine (B-1) injection 100 mg ( Intravenous See Alternative 01/05/19 1100)  feeding supplement (ENSURE ENLIVE) (ENSURE ENLIVE) liquid 237 mL (237 mLs Oral Given 01/05/19 2018)  feeding supplement (PRO-STAT SUGAR FREE 64) liquid 30 mL (30 mLs Oral Not Given 01/05/19 1713)  sodium chloride flush (NS) 0.9 % injection 10-40 mL (10 mLs Intracatheter Given 01/05/19 2018)  sodium chloride flush (NS) 0.9 % injection 10-40 mL ( Intracatheter MAR Unhold 12/31/18 1440)  potassium chloride 10 mEq in 100 mL IVPB (10 mEq Intravenous Not Given 12/31/18 1625)  potassium chloride 10 MEQ/100ML IVPB (  Not Given 12/31/18 1628)  iron polysaccharides (NIFEREX) capsule 150 mg (150 mg Oral Given 01/05/19 1100)  hydrocortisone cream 1 % 1 application (1 application Topical Given  01/02/19 1023)  Chlorhexidine Gluconate Cloth 2 % PADS 6 each (6 each Topical Given 01/05/19 1100)  sulfamethoxazole-trimethoprim (BACTRIM DS) 800-160 MG per tablet 1 tablet (has no administration in time range)  promethazine (PHENERGAN) injection 25 mg (25 mg Intravenous Given 12/28/18 1517)  acetaminophen (TYLENOL) tablet 1,000 mg (1,000 mg Oral Given 12/28/18 6160)  LORazepam (ATIVAN)  injection 1 mg (1 mg Intravenous Given 12/28/18 0647)  ibuprofen (ADVIL) tablet 600 mg (600 mg Oral Given 12/28/18 0748)  sodium chloride 0.9 % bolus 1,000 mL (0 mLs Intravenous Stopped 12/28/18 0842)  vancomycin (VANCOCIN) 1,250 mg in sodium chloride 0.9 % 250 mL IVPB (0 mg Intravenous Stopped 12/28/18 0935)  ceFEPIme (MAXIPIME) 2 g in sodium chloride 0.9 % 100 mL IVPB (0 g Intravenous Stopped 12/28/18 0833)  sodium chloride 0.9 % 1,000 mL with thiamine 737 mg, folic acid 1 mg, multivitamins adult 10 mL infusion ( Intravenous Stopped 12/28/18 2100)  haloperidol lactate (HALDOL) injection 5 mg ( Intravenous Canceled Entry 12/28/18 0937)  chlordiazePOXIDE (LIBRIUM) capsule 50 mg (50 mg Oral Given 12/28/18 1321)  LORazepam (ATIVAN) 2 MG/ML injection (2 mg  Given 12/28/18 1153)  chlorproMAZINE (THORAZINE) 25 mg in sodium chloride 0.9 % 25 mL IVPB ( Intravenous Stopped 12/28/18 1416)  LORazepam (ATIVAN) 2 MG/ML injection (  Duplicate 10/62/69 4854)  LORazepam (ATIVAN) injection 5 mg (5 mg Intravenous Given 12/28/18 1505)  mupirocin ointment (BACTROBAN) 2 % 1 application (1 application Nasal Given 01/01/19 2303)  potassium chloride 10 mEq in 100 mL IVPB (0 mEq Intravenous Stopped 12/31/18 2038)  potassium chloride SA (KLOR-CON) CR tablet 40 mEq (40 mEq Oral Given 01/01/19 1335)  metoprolol tartrate (LOPRESSOR) tablet 12.5 mg (12.5 mg Oral Given 01/04/19 2326)  Oritavancin Diphosphate (ORBACTIV) 1,200 mg in dextrose 5 % IVPB (1,200 mg Intravenous New Bag/Given 01/05/19 1835)     Initial Impression / Assessment and Plan / ED Course  I have reviewed the triage vital signs and the nursing notes.  Pertinent labs & imaging results that were available during my care of the patient were reviewed by me and considered in my medical decision making (see chart for details).        33 year old male with a history of asthma, alcohol abuse, and IV drug use who presents the emergency department with a chief  complaint of myalgias and arthralgias for the last 24 hours accompanied by fever and chills.  He has a history of daily, heavy alcohol use, but has not drank alcohol for the last 24 hours.  He also uses IV methamphetamine, last use 1 week ago, and IV heroin, last use just prior to arrival.  Febrile to 101.2 on arrival with mild tachycardia.  He has a leukocytosis of 13.3.  Code sepsis was initiated.  The patient was empirically started on vancomycin and cefepime.  Blood cultures x2 were collected.  On exam, heart is regular rate and rhythm without murmurs rubs or gallops.  He has no focal joint tenderness.  Spine is nontender.  He is cachectic, covered in scabs, and writhing around on the bed.  I suspect he is bacteremic secondary to IV drug use.  Lower suspicion for epidural abscess, endocarditis, or septic arthritis at this time.  Given his heavy, daily alcohol use with last drink being more than 24 hours ago, CIWA protocol was placed.  Will also order a banana bag for hydration and prevention of DTs.   Chest x-ray with lung hyperexpansion and bronchitic changes, but no evidence of pneumonia.  Albuterol  has been ordered.  COVID-19 test is pending.  Patient also reports a 10 pound weight loss over the last 2 months unintentionally.  Although this could be secondary to methamphetamine use, he does also appear quite cachectic.  He will likely benefit from HIV testing while admitted for sepsis of unknown etiology and an IV drug user. Spoke with the hospitalist team who will accept the patient for admission.  Final Clinical Impressions(s) / ED Diagnoses   Final diagnoses:  Sepsis without acute organ dysfunction, due to unspecified organism [facility])  IVDU (intravenous drug user)    ED Discharge Orders    None       Barkley Boards, PA-C 12/28/18 0805    Nira Conn, MD 12/29/18 0232    Barkley Boards, PA-C 01/05/19 2227    Nira Conn, MD 01/07/19 [phone]

## 2018-12-28 NOTE — ED Notes (Signed)
Patient was attempting to bite RN.

## 2018-12-28 NOTE — ED Notes (Signed)
Patient placed in soft wrist and ankle restraints at this time due to becoming altered mental status and getting out of bed 2x without knowing why or having a reason. RN also concerned that patient may loose IV access due to current mental state.

## 2018-12-28 NOTE — Progress Notes (Signed)
Pharmacy Antibiotic Note  Alex [name] is a 33 y.o. male with PMH substance use disorder admitted on 12/28/2018 with sepsis.  Pharmacy has been consulted for vancomycin and cefepime dosing.  Plan:  Vancomycin 1250 mg IV given already in ED, then 1000 mg IV q12 hr (est AUC 464 based on SCr 0.7 and Vd 0.72)  Measure vancomycin AUC at steady state as indicated  SCr q48 hr while on vancomycin  Cefepime 2 g IV q8 hr  Height: 6' (182.9 cm) Weight: 125 lb (56.7 kg) IBW/kg (Calculated) : 77.6  Temp (24hrs), Avg:100.3 F (37.9 C), Min:99.3 F (37.4 C), Max:101.2 F (38.4 C)  Recent Labs  Lab 12/28/18 0630  WBC 13.3*  CREATININE 0.70    Estimated Creatinine Clearance: 105.3 mL/min (by C-G formula based on SCr of 0.7 mg/dL).    No Known Allergies    Thank you for allowing pharmacy to be a part of this patient's care.  Reuel Boom, PharmD, BCPS [phone] 12/28/2018, 9:19 AM

## 2018-12-28 NOTE — ED Notes (Signed)
RN has spoken with Hospitalist regarding patient being placed in CCM and on drip.

## 2018-12-28 NOTE — Consult Note (Signed)
NAME:  Alex [name], MRN:  [id number], DOB:  June 24, 1985, LOS: 0 ADMISSION DATE:  12/28/2018, CONSULTATION DATE: 12/28/2018 REFERRING MD: Dr. Sharolyn Douglas, CHIEF COMPLAINT: Agitation  Brief History   33 year old gentleman admitted to the intensive care unit for polysubstance abuse and agitation history of alcohol use methamphetamines and opiates.  History of present illness   33 year old gentleman admitted to the intensive care unit for polysubstance abuse and agitation.  Patient has a past medical history of depression, alcohol abuse and polysubstance abuse.  Per documentation use of methamphetamines as well as opiates.  And alcohol.  The patient presented to the emergency room at approximately 12:30 AM this morning.  Complaints of generalized body aches.  He has a history of IV drug use and the patient was febrile.  Patient also complained on admission to having chills and fevers for the past few days.  Has worsening shortness of breath chest and abdominal pain.  No diarrhea per documentation.  He reported then that his last HIV screen was negative.  He is also been losing weight over the past 2 months per ED documentation.  Per chart review he has history of major depressive disorder with polysubstance dependence, tobacco abuse.  Review of systems in the ED during a time in which she was able to answer questions was positive for fevers chills shortness of breath and myalgias.  Past Medical History   Past Medical History:  Diagnosis Date  . ADHD (attention deficit hyperactivity disorder)   . Allergy   . Asthma   . Depressed   . ETOH abuse   . History of suicidal tendencies    Significant Hospital Events   Stepdown unit admission  Consults:  Pulmonary critical care  Procedures:    Significant Diagnostic Tests:    Micro Data:  COVID-19: Pending Blood cultures: Pending  Antimicrobials:  Cefepime Vancomycin  Interim history/subjective:  Patient very agitated thrashing around  the bed.  Obvious potential self-harm.  Not redirectable.    Objective   Blood pressure 132/86, pulse 86, temperature 98.9 F (37.2 C), temperature source Rectal, resp. rate 13, height 6' (1.829 m), weight 54.3 kg, SpO2 94 %.        Intake/Output Summary (Last 24 hours) at 12/28/2018 1320 Last data filed at 12/28/2018 0935 Gross per 24 hour  Intake 2100 ml  Output -  Net 2100 ml   Filed Weights   12/28/18 0040 12/28/18 1254  Weight: 56.7 kg 54.3 kg    Examination: General: Young male restrained in bed encephalopathic unresponsive to commands HENT: NCAT poor dentition Lungs: Clear to auscultation bilaterally, no obvious rhonchi Cardiovascular: Tachycardic, regular S1-S2 Abdomen: Soft, nontender nondistended Extremities: Thin extremities, tattoos Neuro: Encephalopathic, not following commands, moves all 4 extremities spontaneously GU: Foley in place, papules on penile shaft  Resolved Hospital Problem list     Assessment & Plan:   Acute metabolic encephalopathy Acute toxic encephalopathy Possible alcohol withdrawal, delirium tremens Possible drug overdose Sepsis, fevers, chills, myalgias - Patient needs rapid COVID-19 test, at risk for need of intubation and mechanical ventilation - Per documentation of the [facility] patient has only received 6 mg of Ativan IV in ED. - We will give him additional escalating doses of benzodiazepines until he is calm and delirium tremens is under control - We will also give him additional doses of IV antipsychotic - We will observe the patient for close respiratory decline. - If needed patient will be intubated for mechanical ventilation in an effort to protect his airway. -  Continue cefepime, vancomycin, at risk for bacteremia due to IV drug use history.   Best practice:  Diet: N.p.o. Pain/Anxiety/Delirium protocol (if indicated): n/a VAP protocol (if indicated): n/a DVT prophylaxis: na GI prophylaxis: na Glucose control: na Mobility: na  Code Status: full  Family Communication: Per [facility] Disposition: Stepdown unit  Labs   CBC: Recent Labs  Lab 12/28/18 0630  WBC 13.3*  NEUTROABS 11.1*  HGB 11.8*  HCT 35.7*  MCV 89.0  PLT 858    Basic Metabolic Panel: Recent Labs  Lab 12/28/18 0630  NA 130*  K 3.9  CL 95*  CO2 22  GLUCOSE 110*  BUN 8  CREATININE 0.70  CALCIUM 8.7*   GFR: Estimated Creatinine Clearance: 100.9 mL/min (by C-G formula based on SCr of 0.7 mg/dL). Recent Labs  Lab 12/28/18 0630 12/28/18 0747 12/28/18 1026  WBC 13.3*  --   --   LATICACIDVEN  --  1.0 1.3    Liver Function Tests: Recent Labs  Lab 12/28/18 0630  AST 57*  ALT 57*  ALKPHOS 231*  BILITOT 0.8  PROT 7.0  ALBUMIN 2.9*   No results for input(s): LIPASE, AMYLASE in the last 168 hours. No results for input(s): AMMONIA in the last 168 hours.  ABG No results found for: PHART, PCO2ART, PO2ART, HCO3, TCO2, ACIDBASEDEF, O2SAT   Coagulation Profile: Recent Labs  Lab 12/28/18 0747  INR 1.2    Cardiac Enzymes: Recent Labs  Lab 12/28/18 0630  CKTOTAL 52    HbA1C: No results found for: HGBA1C  CBG: No results for input(s): GLUCAP in the last 168 hours.  Review of Systems:   Unable to be obtained secondary to altered mental status.  Past Medical History  He,  has a past medical history of ADHD (attention deficit hyperactivity disorder), Allergy, Asthma, Depressed, ETOH abuse, and History of suicidal tendencies.   Surgical History   History reviewed. No pertinent surgical history.   Social History   reports that he has been smoking cigarettes. He has a 5.00 pack-year smoking history. He has never used smokeless tobacco. He reports current alcohol use of about 12.0 standard drinks of alcohol per week. He reports current drug use. Frequency: 6.00 times per week. Drugs: Marijuana and Cocaine.   Family History   His family history includes COPD in an other family member.   Allergies No Known Allergies   Home  Medications  Prior to Admission medications   Medication Sig Start Date End Date Taking? Authorizing Provider  albuterol (PROVENTIL HFA) 108 (90 Base) MCG/ACT inhaler Inhale 1-2 puffs into the lungs every 4 (four) hours as needed for wheezing or shortness of breath. 09/08/18  Yes Wieters, Hallie C, PA-C  albuterol (PROVENTIL) (2.5 MG/3ML) 0.083% nebulizer solution Take 3 mLs (2.5 mg total) by nebulization every 6 (six) hours as needed for wheezing or shortness of breath. 09/08/18  Yes Wieters, Hallie C, PA-C  hydroxypropyl methylcellulose / hypromellose (ISOPTO TEARS / GONIOVISC) 2.5 % ophthalmic solution Place 1 drop into both eyes 3 (three) times daily as needed for dry eyes.   Yes [provider]  naproxen sodium (ALEVE) 220 MG tablet Take 440 mg by mouth 2 (two) times daily as needed (pain).   Yes [provider]  ipratropium-albuterol (DUONEB) 0.5-2.5 (3) MG/3ML SOLN Take 3 mLs by nebulization every 6 (six) hours as needed (wheezing). Patient not taking: Reported on 12/28/2018 04/14/18 12/28/18  Charlann Lange, PA-C  montelukast (SINGULAIR) 10 MG tablet Take 1 tablet (10 mg total) by mouth  at bedtime. Patient not taking: Reported on 12/28/2018 07/25/17 12/28/18  Georgetta HaberBurky, Natalie B, NP  traZODone (DESYREL) 50 MG tablet Take 1 tablet (50 mg total) by mouth at bedtime and may repeat dose one time if needed. Patient not taking: Reported on 07/05/2018 04/29/16 12/28/18  Oneta RackLewis, Tanika N, NP     This patient is critically ill with multiple organ system failure; which, requires frequent high complexity decision making, assessment, support, evaluation, and titration of therapies. This was completed through the application of advanced monitoring technologies and extensive interpretation of multiple databases. During this encounter critical care time was devoted to patient care services described in this note for 38 minutes.  Josephine IgoBradley L Amarachukwu Lakatos, DO [facility] 12/28/2018 4:07  PM  Personal pager: [phone] If unanswered, please page CCM On-call: [phone]

## 2018-12-28 NOTE — H&P (Addendum)
History and Physical  Alex [name] ASU:[id number] DOB: 04/05/1985 DOA: 12/28/2018   Patient coming from: Home & is able to ambulate  Chief Complaint: Generalized body aches, fever  HPI: Alex [name] is a 33 y.o. male with medical history significant for alcohol abuse, polysubstance abuse, asthma, presents to the ED complaining of generalized body aches and fever for the past 1 day.  History gotten from chart review, as patient was mildly sedated and was unable to give me proper history.  Patient presented with nonspecific generalized body aches, including diffuse joint aches, with subjective fever.  Patient denies any chest pain, cough, abdominal pain, nausea/vomiting, diarrhea, dizziness, headaches.  Of note, patient has a significant history of polysubstance abuse with IV heroin and methamphetamines which he reports injecting in his left upper extremity.  Last meth use was about a week ago, last heroine use was right before he presented to the ED.  Patient is currently homeless, and lives in and out of hotels for the past couple of months.  ED Course: Patient noted to be sedated, but also combative and very agitated/restless.  Fever 101.2, tachycardic, saturating well on room air.  Labs showed WBC 13.3, hemoglobin 11.8, sodium of 130.  UDS positive for amphetamines, opiates.  Acetaminophen/salicylate/alcohol levels all unremarkable.  COVID-19 pending.  Patient admitted for further management.  Review of Systems: Review of systems are otherwise negative   Past Medical History:  Diagnosis Date  . ADHD (attention deficit hyperactivity disorder)   . Allergy   . Asthma   . Depressed   . ETOH abuse   . History of suicidal tendencies    History reviewed. No pertinent surgical history.  Social History:  reports that he has been smoking cigarettes. He has a 5.00 pack-year smoking history. He has never used smokeless tobacco. He reports current alcohol use of about 12.0 standard drinks of  alcohol per week. He reports current drug use. Frequency: 6.00 times per week. Drugs: Marijuana and Cocaine.   No Known Allergies  Family History  Problem Relation Age of Onset  . COPD Other       Prior to Admission medications   Medication Sig Start Date End Date Taking? Authorizing Provider  albuterol (PROVENTIL HFA) 108 (90 Base) MCG/ACT inhaler Inhale 1-2 puffs into the lungs every 4 (four) hours as needed for wheezing or shortness of breath. 09/08/18  Yes Wieters, Hallie C, PA-C  albuterol (PROVENTIL) (2.5 MG/3ML) 0.083% nebulizer solution Take 3 mLs (2.5 mg total) by nebulization every 6 (six) hours as needed for wheezing or shortness of breath. 09/08/18  Yes Wieters, Hallie C, PA-C  hydroxypropyl methylcellulose / hypromellose (ISOPTO TEARS / GONIOVISC) 2.5 % ophthalmic solution Place 1 drop into both eyes 3 (three) times daily as needed for dry eyes.   Yes [provider]  naproxen sodium (ALEVE) 220 MG tablet Take 440 mg by mouth 2 (two) times daily as needed (pain).   Yes [provider]  ipratropium-albuterol (DUONEB) 0.5-2.5 (3) MG/3ML SOLN Take 3 mLs by nebulization every 6 (six) hours as needed (wheezing). Patient not taking: Reported on 12/28/2018 04/14/18 12/28/18  Charlann Lange, PA-C  montelukast (SINGULAIR) 10 MG tablet Take 1 tablet (10 mg total) by mouth at bedtime. Patient not taking: Reported on 12/28/2018 07/25/17 12/28/18  Zigmund Gottron, NP  traZODone (DESYREL) 50 MG tablet Take 1 tablet (50 mg total) by mouth at bedtime and may repeat dose one time if needed. Patient not taking: Reported on 07/05/2018 04/29/16 12/28/18  Derrill Center, NP    Physical Exam: BP 95/79   Pulse 87   Temp (!) 101.2 F (38.4 C) (Rectal)   Resp 17   Ht 6' (1.829 m)   Wt 56.7 kg   SpO2 100%   BMI 16.95 kg/m   General: NAD, cachectic/chronically ill-appearing, restless/thrashing around the bed Eyes: Normal ENT: Normal Neck: Supple Cardiovascular: S1, S2 present  Respiratory: Mild bilateral wheezing noted, otherwise good air entry Abdomen: Soft, nontender, nondistended, bowel sounds present Skin: Multiple scabs noted throughout his body, track marks noted on the left upper extremity Musculoskeletal: No pedal edema noted bilaterally Psychiatric: Restless, agitated, combative Neurologic: No obvious focal neurologic deficits noted          Labs on Admission:  Basic Metabolic Panel: Recent Labs  Lab 12/28/18 0630  NA 130*  K 3.9  CL 95*  CO2 22  GLUCOSE 110*  BUN 8  CREATININE 0.70  CALCIUM 8.7*   Liver Function Tests: Recent Labs  Lab 12/28/18 0630  AST 57*  ALT 57*  ALKPHOS 231*  BILITOT 0.8  PROT 7.0  ALBUMIN 2.9*   No results for input(s): LIPASE, AMYLASE in the last 168 hours. No results for input(s): AMMONIA in the last 168 hours. CBC: Recent Labs  Lab 12/28/18 0630  WBC 13.3*  NEUTROABS 11.1*  HGB 11.8*  HCT 35.7*  MCV 89.0  PLT 208   Cardiac Enzymes: Recent Labs  Lab 12/28/18 0630  CKTOTAL 52    BNP (last 3 results) No results for input(s): BNP in the last 8760 hours.  ProBNP (last 3 results) No results for input(s): PROBNP in the last 8760 hours.  CBG: No results for input(s): GLUCAP in the last 168 hours.  Radiological Exams on Admission: Dg Chest 2 View  Result Date: 12/28/2018 CLINICAL DATA:  Generalized body aches.  Drug abuse. EXAM: CHEST - 2 VIEW COMPARISON:  11/21/2018; 04/26/2014 FINDINGS: Grossly unchanged cardiac silhouette and mediastinal contours. The lungs appear hyperexpanded with flattening of the diaphragms mild diffuse slightly nodular thickening of the pulmonary interstitium. No discrete focal airspace opacities. No pleural effusion or pneumothorax. No evidence of edema. No acute osseous abnormalities. IMPRESSION: Similar findings of lung hyperexpansion and bronchitic change without superimposed acute cardiopulmonary disease. Electronically Signed   By: Sandi Mariscal M.D.   On:  12/28/2018 06:11    EKG: Sinus rhythm noted, no acute ST changes  Assessment/Plan Present on Admission: . Sepsis ([place]) . Tobacco use disorder . Polysubstance dependence ([place]) . Substance induced mood disorder ([facility])  Principal Problem:   Sepsis ([place]) Active Problems:   Tobacco use disorder   Polysubstance dependence ([place])   Substance induced mood disorder ([place])   Sepsis with generalized body aches Unknown etiology, rule out bacteremia in an IV drug abuser Febrile, tachycardic, leukocytosis on presentation COVID-19 pending ESR, CRP, LA pending collection BC x2 pending UA negative for infection, UC pending Chest x-ray showed lung hyperexpansion bronchitic changes without superimposed acute cardiopulmonary disease Continue broad-spectrum antibiotics, vancomycin, cefepime IV fluids Monitor closely  Hyponatremia Likely 2/2 poor oral intake IV fluids Daily BMP  Elevated liver enzymes Unknown etiology Alkaline phosphatase 231, AST 57, ALT 57 Viral hepatitis panel pending Right upper quadrant ultrasound pending  Asthma Noted to be wheezing Chest x-ray with hyperexpansion and bronchitic changes Albuterol nebulizer/inhalers ordered  Normocytic anemia Unknown etiology Baseline hemoglobin normal around 14, presents with 11.8 Daily CBC  Polysubstance abuse with active possible withdrawal Currently combative, agitated/restless Recent IV heroin, amphetamine use UDS positive  for amphetamines, opiates CIWA protocol Monitor closely in SDU, may require mechanical ventilation to protect airway Social work consult  Tobacco abuse Advised to quit smoking Nicotine patch ordered     DVT prophylaxis: Lovenox  Code Status: Full  Family Communication: None at bedside  Disposition Plan: To be determined  Consults called: None  Admission status: Inpatient    Alma Friendly MD [facility]  If 7PM-7AM, please contact night-coverage [email or web address]   12/28/2018, 9:13 AM

## 2018-12-28 NOTE — ED Notes (Signed)
Patient continues to thrash around, attempting to take out IV's, rips off Cardiac monitoring device and O2 device.   Nurse Secretary has paged Hospitalist.

## 2018-12-28 NOTE — ED Notes (Signed)
Abd Ultrasound can not be completed due to patient still being somewhat combative and jerking/twitching due to current diagnosis.

## 2018-12-28 NOTE — ED Triage Notes (Signed)
Pt presents with generalized body aches with most pain in left lower leg. No obvious injury to left foot or leg. Pt does report using drugs.

## 2018-12-28 NOTE — ED Notes (Signed)
Patient was attempting to rip off Pulse Ox with teeth.

## 2018-12-28 NOTE — Progress Notes (Signed)
PHARMACY - PHYSICIAN COMMUNICATION CRITICAL VALUE ALERT - BLOOD CULTURE IDENTIFICATION (BCID)  Alex [name] is an 33 y.o. male who presented to [facility] on 12/28/2018 with a chief complaint of sepsis in IVDU  Assessment:  1 of 4 bottles blood with [place]  Name of physician (or Provider) Contacted: n/a  Current antibiotics: vanc/cefeime  Changes to prescribed antibiotics recommended:  No changes  No results found for this or any previous visit.  Alex [name] 12/28/2018  7:49 PM

## 2018-12-28 NOTE — Progress Notes (Signed)
PHARMACY - PHYSICIAN COMMUNICATION CRITICAL VALUE ALERT - BLOOD CULTURE IDENTIFICATION (BCID)  Alex [name] is an 33 y.o. male who presented to [facility] on 12/28/2018 with a chief complaint of IVDU.  Assessment:  bacteremia (include suspected source if known) 4 of 4 bottles MRSA Name of physician (or Provider) Contacted: Jeannette Corpus, NP  Current antibiotics: cefepime and vancomycin  Changes to prescribed antibiotics recommended:  Patient is on recommended antibiotics - No changes needed  Consider narrowing to just Vancomycin when clinically appropriate  No results found for this or any previous visit.  Dorrene German 12/28/2018  10:45 PM

## 2018-12-29 ENCOUNTER — Inpatient Hospital Stay (HOSPITAL_COMMUNITY): Payer: Self-pay

## 2018-12-29 ENCOUNTER — Encounter (HOSPITAL_COMMUNITY): Payer: Self-pay

## 2018-12-29 DIAGNOSIS — F1010 Alcohol abuse, uncomplicated: Secondary | ICD-10-CM

## 2018-12-29 DIAGNOSIS — F1510 Other stimulant abuse, uncomplicated: Secondary | ICD-10-CM

## 2018-12-29 DIAGNOSIS — F1110 Opioid abuse, uncomplicated: Secondary | ICD-10-CM

## 2018-12-29 DIAGNOSIS — G9340 Encephalopathy, unspecified: Secondary | ICD-10-CM

## 2018-12-29 DIAGNOSIS — R7881 Bacteremia: Secondary | ICD-10-CM

## 2018-12-29 DIAGNOSIS — B9561 Methicillin susceptible Staphylococcus aureus infection as the cause of diseases classified elsewhere: Secondary | ICD-10-CM

## 2018-12-29 DIAGNOSIS — F17210 Nicotine dependence, cigarettes, uncomplicated: Secondary | ICD-10-CM

## 2018-12-29 DIAGNOSIS — R451 Restlessness and agitation: Secondary | ICD-10-CM

## 2018-12-29 LAB — GLUCOSE, CAPILLARY
Glucose-Capillary: 86 mg/dL (ref 70–99)
Glucose-Capillary: 88 mg/dL (ref 70–99)
Glucose-Capillary: 89 mg/dL (ref 70–99)

## 2018-12-29 LAB — CBC
HCT: 35.5 % — ABNORMAL LOW (ref 39.0–52.0)
Hemoglobin: 12.2 g/dL — ABNORMAL LOW (ref 13.0–17.0)
MCH: 29.4 pg (ref 26.0–34.0)
MCHC: 34.4 g/dL (ref 30.0–36.0)
MCV: 85.5 fL (ref 80.0–100.0)
Platelets: 178 10*3/uL (ref 150–400)
RBC: 4.15 MIL/uL — ABNORMAL LOW (ref 4.22–5.81)
RDW: 12.4 % (ref 11.5–15.5)
WBC: 14.8 10*3/uL — ABNORMAL HIGH (ref 4.0–10.5)
nRBC: 0 % (ref 0.0–0.2)

## 2018-12-29 LAB — BLOOD CULTURE ID PANEL (REFLEXED)

## 2018-12-29 LAB — COMPREHENSIVE METABOLIC PANEL
ALT: 36 U/L (ref 0–44)
AST: 26 U/L (ref 15–41)
Albumin: 2.5 g/dL — ABNORMAL LOW (ref 3.5–5.0)
Alkaline Phosphatase: 166 U/L — ABNORMAL HIGH (ref 38–126)
Anion gap: 11 (ref 5–15)
BUN: 9 mg/dL (ref 6–20)
CO2: 18 mmol/L — ABNORMAL LOW (ref 22–32)
Calcium: 8.4 mg/dL — ABNORMAL LOW (ref 8.9–10.3)
Chloride: 110 mmol/L (ref 98–111)
Creatinine, Ser: 0.62 mg/dL (ref 0.61–1.24)
GFR calc Af Amer: >60 mL/min (ref >60)
GFR calc non Af Amer: >60 mL/min (ref >60)
Glucose, Bld: 89 mg/dL (ref 70–99)
Potassium: 3.7 mmol/L (ref 3.5–5.1)
Sodium: 139 mmol/L (ref 135–145)
Total Bilirubin: 0.7 mg/dL (ref 0.3–1.2)
Total Protein: 6 g/dL — ABNORMAL LOW (ref 6.5–8.1)

## 2018-12-29 LAB — MAGNESIUM: Magnesium: 1.9 mg/dL (ref 1.7–2.4)

## 2018-12-29 LAB — PHOSPHORUS: Phosphorus: 3.9 mg/dL (ref 2.5–4.6)

## 2018-12-29 MED ORDER — LORAZEPAM 1 MG PO TABS
1.0000 mg | ORAL_TABLET | ORAL | Status: AC | PRN
  Administered 2019-01-01: 1 mg via ORAL
  Filled 2018-12-29: qty 1

## 2018-12-29 MED ORDER — CHLORDIAZEPOXIDE HCL 5 MG PO CAPS
10.0000 mg | ORAL_CAPSULE | Freq: Three times a day (TID) | ORAL | Status: DC
  Administered 2018-12-29 – 2019-01-05 (×23): 10 mg via ORAL
  Filled 2018-12-29 (×2): qty 2
  Filled 2018-12-29: qty 1
  Filled 2018-12-29 (×5): qty 2
  Filled 2018-12-29: qty 1
  Filled 2018-12-29: qty 2
  Filled 2018-12-29: qty 1
  Filled 2018-12-29: qty 2
  Filled 2018-12-29: qty 1
  Filled 2018-12-29 (×10): qty 2

## 2018-12-29 MED ORDER — FOLIC ACID 1 MG PO TABS
1.0000 mg | ORAL_TABLET | Freq: Every day | ORAL | Status: DC
  Administered 2018-12-29 – 2019-01-06 (×8): 1 mg via ORAL
  Filled 2018-12-29 (×8): qty 1

## 2018-12-29 MED ORDER — LORAZEPAM 2 MG/ML IJ SOLN
2.0000 mg | INTRAMUSCULAR | Status: DC | PRN
  Administered 2018-12-29 – 2019-01-06 (×17): 2 mg via INTRAVENOUS
  Filled 2018-12-29 (×20): qty 1

## 2018-12-29 MED ORDER — ADULT MULTIVITAMIN W/MINERALS CH
1.0000 | ORAL_TABLET | Freq: Every day | ORAL | Status: DC
  Administered 2018-12-29 – 2019-01-06 (×8): 1 via ORAL
  Filled 2018-12-29 (×8): qty 1

## 2018-12-29 MED ORDER — LORAZEPAM 2 MG/ML IJ SOLN
1.0000 mg | INTRAMUSCULAR | Status: AC | PRN
  Administered 2018-12-29: 2 mg via INTRAVENOUS
  Administered 2018-12-29 (×2): 4 mg via INTRAVENOUS
  Administered 2018-12-29: 3 mg via INTRAVENOUS
  Administered 2018-12-30: 1 mg via INTRAVENOUS
  Administered 2018-12-30: 01:00:00 2 mg via INTRAVENOUS
  Administered 2018-12-31: 1 mg via INTRAVENOUS
  Administered 2018-12-31: 2 mg via INTRAVENOUS
  Administered 2018-12-31: 1 mg via INTRAVENOUS
  Administered 2018-12-31: 2 mg via INTRAVENOUS
  Filled 2018-12-29: qty 2
  Filled 2018-12-29 (×2): qty 1
  Filled 2018-12-29: qty 2
  Filled 2018-12-29 (×3): qty 1
  Filled 2018-12-29: qty 2
  Filled 2018-12-29 (×2): qty 1

## 2018-12-29 MED ORDER — VITAMIN B-1 100 MG PO TABS
100.0000 mg | ORAL_TABLET | Freq: Every day | ORAL | Status: DC
  Administered 2018-12-29 – 2019-01-06 (×8): 100 mg via ORAL
  Filled 2018-12-29 (×8): qty 1

## 2018-12-29 MED ORDER — THIAMINE HCL 100 MG/ML IJ SOLN
100.0000 mg | Freq: Every day | INTRAMUSCULAR | Status: DC
  Administered 2018-12-31: 100 mg via INTRAVENOUS
  Filled 2018-12-29: qty 2

## 2018-12-29 NOTE — CV Procedure (Signed)
Attempted echocardiogram but patient unable to lay still; raising up out of bed.

## 2018-12-29 NOTE — Consult Note (Signed)
[facility]    Date of Admission:  12/28/2018   Total days of antibiotics: 1 vanco               Reason for Consult: Staph aureus bacteremia    Referring Provider: CHAMP   Assessment: Staph aureus bacteremia IVDA ETOH abuse  Plan: 1. continue vanco 2. Stop cefepime 3. Check gc/chalmydia, RPR 4. Repeat BCx 5. He did not tolerate TTE due to agitation. Repeat when he is able 6. Treat for withdrawal  Comment- He is at very high risk for endocarditis.  Will need to wait for imaging of his CV Will need to get his detox hx when he is more cooperative.   Thank you so much for this interesting consult,  Principal Problem:   Sepsis (HCC) Active Problems:   Tobacco use disorder   Polysubstance dependence (HCC)   Substance induced mood disorder (HCC)    chlordiazePOXIDE  10 mg Oral TID   Chlorhexidine Gluconate Cloth  6 each Topical Q0600   enoxaparin (LOVENOX) injection  40 mg Subcutaneous Q24H   folic acid  1 mg Oral Daily   mouth rinse  15 mL Mouth Rinse BID   multivitamin with minerals  1 tablet Oral Daily   mupirocin ointment  1 application Nasal BID   nicotine  21 mg Transdermal Daily   thiamine  100 mg Oral Daily   Or   thiamine  100 mg Intravenous Daily    HPI: Alex [name] is a 33 y.o. male with hx of IVDA, ETOH abuse, admon 10-18 with myalgias. He attempted to improve this with heroin without relief. He developed worsening SOB as well, CP and abd pain.  He has lost 10# as well.  He has been using heroin/methamphetamine/ETOH.  In Ed he was found to have temp 101.2,  WBC 13, CXR with no acute disease. He was started on vanoc/cefepime. His BCx 4/4 have since grown staph aureus and his UCx is 30k S aureus.   His HIV and viral hepatitis panel is negative.   Review of Systems: Review of Systems  Unable to perform ROS: Mental status change  Constitutional: Positive for fever.    Past Medical History:    Diagnosis Date   ADHD (attention deficit hyperactivity disorder)    Allergy    Asthma    Depressed    ETOH abuse    History of suicidal tendencies     Social History   Tobacco Use   Smoking status: Current Every Day Smoker    Packs/day: 1.00    Years: 10.00    Pack years: 10.00    Types: Cigarettes   Smokeless tobacco: Never Used  Substance Use Topics   Alcohol use: Yes    Alcohol/week: 12.0 standard drinks    Types: 12 Cans of beer per week    Comment: 12 beers daily   Drug use: Yes    Frequency: 6.0 times per week    Types: Marijuana, Cocaine, Heroin, Fentanyl    Comment: daily use cocaine (snort)    Family History  Problem Relation Age of Onset   COPD Other      Medications:  Scheduled:  chlordiazePOXIDE  10 mg Oral TID   Chlorhexidine Gluconate Cloth  6 each Topical Q0600   enoxaparin (LOVENOX) injection  40 mg Subcutaneous Q24H   folic acid  1 mg Oral Daily   mouth rinse  15 mL Mouth Rinse BID  multivitamin with minerals  1 tablet Oral Daily   mupirocin ointment  1 application Nasal BID   nicotine  21 mg Transdermal Daily   thiamine  100 mg Oral Daily   Or   thiamine  100 mg Intravenous Daily    Abtx:  Anti-infectives (From admission, onward)   Start     Dose/Rate Route Frequency Ordered Stop   12/28/18 1800  vancomycin (VANCOCIN) IVPB 1000 mg/200 mL premix     1,000 mg 200 mL/hr over 60 Minutes Intravenous 2 times daily 12/28/18 0914     12/28/18 1400  ceFEPIme (MAXIPIME) 2 g in sodium chloride 0.9 % 100 mL IVPB     2 g 200 mL/hr over 30 Minutes Intravenous Every 8 hours 12/28/18 0914     12/28/18 0730  vancomycin (VANCOCIN) 1,250 mg in sodium chloride 0.9 % 250 mL IVPB     1,250 mg 166.7 mL/hr over 90 Minutes Intravenous STAT 12/28/18 0715 12/28/18 0935   12/28/18 0730  ceFEPIme (MAXIPIME) 2 g in sodium chloride 0.9 % 100 mL IVPB     2 g 200 mL/hr over 30 Minutes Intravenous STAT 12/28/18 0715 12/28/18 0833         OBJECTIVE: Blood pressure 121/70, pulse 92, temperature (!) 100.6 F (38.1 C), temperature source Bladder, resp. rate 17, height 6' (1.829 m), weight 54.3 kg, SpO2 100 %.  Physical Exam Constitutional:      Appearance: He is ill-appearing.  HENT:     Mouth/Throat:     Mouth: Mucous membranes are moist.  Eyes:     Extraocular Movements: Extraocular movements intact.     Pupils: Pupils are equal, round, and reactive to light.  Cardiovascular:     Rate and Rhythm: Normal rate and regular rhythm.  Pulmonary:     Effort: Pulmonary effort is normal.     Breath sounds: Normal breath sounds.  Abdominal:     General: Abdomen is flat. Bowel sounds are normal. There is no distension.     Palpations: Abdomen is soft.     Tenderness: There is no abdominal tenderness.  Genitourinary:    Penis: Normal and circumcised. No swelling or lesions.   Musculoskeletal:       Arms:     Right lower leg: No edema.     Left lower leg: No edema.  Psychiatric:        Behavior: Behavior is agitated.     Comments: encephalopathic     Lab Results Results for orders placed or performed during the hospital encounter of 12/28/18 (from the past 48 hour(s))  Urinalysis, Routine w reflex microscopic     Status: Abnormal   Collection Time: 12/28/18  5:36 AM  Result Value Ref Range   Color, Urine YELLOW YELLOW   APPearance CLEAR CLEAR   Specific Gravity, Urine 1.010 1.005 - 1.030   pH 6.0 5.0 - 8.0   Glucose, UA NEGATIVE NEGATIVE mg/dL   Hgb urine dipstick SMALL (A) NEGATIVE   Bilirubin Urine NEGATIVE NEGATIVE   Ketones, ur NEGATIVE NEGATIVE mg/dL   Protein, ur NEGATIVE NEGATIVE mg/dL   Nitrite NEGATIVE NEGATIVE   Leukocytes,Ua NEGATIVE NEGATIVE   RBC / HPF 0-5 0 - 5 RBC/hpf   WBC, UA 0-5 0 - 5 WBC/hpf   Bacteria, UA NONE SEEN NONE SEEN   Mucus PRESENT     Comment: Performed at [facility], [address] [place], [place] [address]  SARS CORONAVIRUS 2 (TAT 6-24 HRS)  Nasopharyngeal Nasopharyngeal Swab  Status: None   Collection Time: 12/28/18  5:37 AM   Specimen: Nasopharyngeal Swab  Result Value Ref Range   SARS Coronavirus 2 NEGATIVE NEGATIVE    Comment: (NOTE) SARS-CoV-2 target nucleic acids are NOT DETECTED. The SARS-CoV-2 RNA is generally detectable in upper and lower respiratory specimens during the acute phase of infection. Negative results do not preclude SARS-CoV-2 infection, do not rule out co-infections with other pathogens, and should not be used as the sole basis for treatment or other patient management decisions. Negative results must be combined with clinical observations, patient history, and epidemiological information. The expected result is Negative. Fact Sheet for Patients: HairSlick.no Fact Sheet for Healthcare Providers: quierodirigir.com This test is not yet approved or cleared by the [place] FDA and  has been authorized for detection and/or diagnosis of SARS-CoV-2 by FDA under an Emergency Use Authorization (EUA). This EUA will remain  in effect (meaning this test can be used) for the duration of the COVID-19 declaration under Section 56 4(b)(1) of the Act, 21 U.S.C. section 360bbb-3(b)(1), unless the authorization is terminated or revoked sooner. Performed at [facility], [address] [place], [place] [address]   Rapid urine drug screen (hospital performed)     Status: Abnormal   Collection Time: 12/28/18  5:39 AM  Result Value Ref Range   Opiates POSITIVE (A) NONE DETECTED   Cocaine NONE DETECTED NONE DETECTED   Benzodiazepines NONE DETECTED NONE DETECTED   Amphetamines POSITIVE (A) NONE DETECTED   Tetrahydrocannabinol NONE DETECTED NONE DETECTED   Barbiturates NONE DETECTED NONE DETECTED    Comment: (NOTE) DRUG SCREEN FOR MEDICAL PURPOSES ONLY.  IF CONFIRMATION IS NEEDED FOR ANY PURPOSE, NOTIFY LAB WITHIN 5 DAYS. LOWEST DETECTABLE  LIMITS FOR URINE DRUG SCREEN Drug Class                     Cutoff (ng/mL) Amphetamine and metabolites    1000 Barbiturate and metabolites    200 Benzodiazepine                 200 Tricyclics and metabolites     300 Opiates and metabolites        300 Cocaine and metabolites        300 THC                            50 Performed at [facility], [address], [place], [place] [address]   CBC with Differential     Status: Abnormal   Collection Time: 12/28/18  6:30 AM  Result Value Ref Range   WBC 13.3 (H) 4.0 - 10.5 K/uL   RBC 4.01 (L) 4.22 - 5.81 MIL/uL   Hemoglobin 11.8 (L) 13.0 - 17.0 g/dL   HCT 19.1 (L) 47.8 - 29.5 %   MCV 89.0 80.0 - 100.0 fL   MCH 29.4 26.0 - 34.0 pg   MCHC 33.1 30.0 - 36.0 g/dL   RDW 62.1 30.8 - 65.7 %   Platelets 208 150 - 400 K/uL   nRBC 0.0 0.0 - 0.2 %   Neutrophils Relative % 84 %   Neutro Abs 11.1 (H) 1.7 - 7.7 K/uL   Lymphocytes Relative 8 %   Lymphs Abs 1.0 0.7 - 4.0 K/uL   Monocytes Relative 8 %   Monocytes Absolute 1.1 (H) 0.1 - 1.0 K/uL   Eosinophils Relative 0 %   Eosinophils Absolute 0.0 0.0 - 0.5  K/uL   Basophils Relative 0 %   Basophils Absolute 0.1 0.0 - 0.1 K/uL   Immature Granulocytes 0 %   Abs Immature Granulocytes 0.06 0.00 - 0.07 K/uL    Comment: Performed at [facility], [address] [place], [place] [address]  Comprehensive metabolic panel     Status: Abnormal   Collection Time: 12/28/18  6:30 AM  Result Value Ref Range   Sodium 130 (L) 135 - 145 mmol/L   Potassium 3.9 3.5 - 5.1 mmol/L   Chloride 95 (L) 98 - 111 mmol/L   CO2 22 22 - 32 mmol/L   Glucose, Bld 110 (H) 70 - 99 mg/dL   BUN 8 6 - 20 mg/dL   Creatinine, Ser 6.04 0.61 - 1.24 mg/dL   Calcium 8.7 (L) 8.9 - 10.3 mg/dL   Total Protein 7.0 6.5 - 8.1 g/dL   Albumin 2.9 (L) 3.5 - 5.0 g/dL   AST 57 (H) 15 - 41 U/L   ALT 57 (H) 0 - 44 U/L   Alkaline Phosphatase 231 (H) 38 - 126 U/L   Total Bilirubin 0.8 0.3 - 1.2 mg/dL   GFR  calc non Af Amer >60 >60 mL/min   GFR calc Af Amer >60 >60 mL/min   Anion gap 13 5 - 15    Comment: Performed at [facility], [address] [place], [place] [address]  Ethanol     Status: None   Collection Time: 12/28/18  6:30 AM  Result Value Ref Range   Alcohol, Ethyl (B) <10 <10 mg/dL    Comment: (NOTE) Lowest detectable limit for serum alcohol is 10 mg/dL. For medical purposes only. Performed at [facility], [address], [place], [place] [address]   CK     Status: None   Collection Time: 12/28/18  6:30 AM  Result Value Ref Range   Total CK 52 49 - 397 U/L    Comment: Performed at [facility] [address] [place], [place] [address]  Blood culture (routine x 2)     Status: Abnormal (Preliminary result)   Collection Time: 12/28/18  7:47 AM   Specimen: BLOOD RIGHT ARM  Result Value Ref Range   Specimen Description      BLOOD RIGHT ARM Performed at [facility], [address] [place], [place] [address]    Special Requests      BOTTLES DRAWN AEROBIC AND ANAEROBIC Blood Culture results may not be optimal due to an excessive volume of blood received in culture bottles Performed at [facility], [address] [place], [place] [address]    Culture  Setup Time      GRAM POSITIVE COCCI IN BOTH AEROBIC AND ANAEROBIC BOTTLES CRITICAL RESULT CALLED TO, READ BACK BY AND VERIFIED WITH: PHARMD JUSTIN L 1948 [id number] FCP CRITICAL RESULT CALLED TO, READ BACK BY AND VERIFIED WITH: B GREEN,PHARMD AT 2206 12/28/2018 BY L BENFIELD Performed at [facility], [address] [place], [place] [address]    Culture STAPHYLOCOCCUS AUREUS (A)    Report Status PENDING   Blood culture (routine x 2)     Status: Abnormal (Preliminary result)   Collection Time: 12/28/18  7:47 AM   Specimen: BLOOD  Result Value Ref Range   Specimen Description      BLOOD RIGHT WRIST Performed at [facility], [address], [place], [place] [address]    Special Requests  BOTTLES DRAWN AEROBIC AND ANAEROBIC Blood Culture results may not be optimal due to an inadequate volume of blood received in culture bottles Performed at [facility], [address], [place], [place] [address]    Culture  Setup Time      GRAM POSITIVE COCCI IN CLUSTERS IN BOTH AEROBIC AND ANAEROBIC BOTTLES CRITICAL VALUE NOTED.  VALUE IS CONSISTENT WITH PREVIOUSLY REPORTED AND CALLED VALUE. Performed at [facility], [address] [place], [place] [address]    Culture STAPHYLOCOCCUS AUREUS (A)    Report Status PENDING   Lactic acid, plasma     Status: None   Collection Time: 12/28/18  7:47 AM  Result Value Ref Range   Lactic Acid, Venous 1.0 0.5 - 1.9 mmol/L    Comment: Performed at [facility], [address], [place], [place] [address]  APTT     Status: Abnormal   Collection Time: 12/28/18  7:47 AM  Result Value Ref Range   aPTT 38 (H) 24 - 36 seconds    Comment:        IF BASELINE aPTT IS ELEVATED, SUGGEST PATIENT RISK ASSESSMENT BE USED TO DETERMINE APPROPRIATE ANTICOAGULANT THERAPY. Performed at [facility], [address] [place], [place] [address]   Protime-INR     Status: Abnormal   Collection Time: 12/28/18  7:47 AM  Result Value Ref Range   Prothrombin Time 15.5 (H) 11.4 - 15.2 seconds   INR 1.2 0.8 - 1.2    Comment: (NOTE) INR goal varies based on device and disease states. Performed at [facility], [address], [place], [place] [address]   Urine culture     Status: Abnormal (Preliminary result)   Collection Time: 12/28/18  7:47 AM   Specimen: In/Out Cath Urine  Result Value Ref Range   Specimen Description      IN/OUT CATH URINE Performed at [facility], [address] [place], [place] [address]    Special Requests      NONE Performed at [facility], [address] [place], [place] [address]    Culture (A)     30,000 COLONIES/mL STAPHYLOCOCCUS AUREUS SUSCEPTIBILITIES TO FOLLOW Performed at [facility], [address], [place], [place] [address]    Report Status PENDING   Blood Culture ID Panel (Reflexed)     Status: Abnormal   Collection Time: 12/28/18  7:47 AM  Result Value Ref Range   Enterococcus species NOT DETECTED NOT DETECTED   Listeria monocytogenes NOT DETECTED NOT DETECTED   Staphylococcus species DETECTED (A) NOT DETECTED    Comment: CRITICAL RESULT CALLED TO, READ BACK BY AND VERIFIED WITH: B GREEN,PHARMD AT 2206 12/28/2018 BY L BENFIELD    Staphylococcus aureus (BCID) DETECTED (A) NOT DETECTED    Comment: Methicillin (oxacillin)-resistant Staphylococcus aureus (MRSA). MRSA is predictably resistant to beta-lactam antibiotics (except ceftaroline). Preferred therapy is vancomycin unless clinically contraindicated. Patient requires contact precautions if  hospitalized. CRITICAL RESULT CALLED TO, READ BACK BY AND VERIFIED WITH: B GREEN,PHARMD AT 2206 12/28/2018 BY L BENFIELD    Methicillin resistance DETECTED (A) NOT DETECTED    Comment: CRITICAL RESULT CALLED TO, READ BACK BY AND VERIFIED WITH: B GREEN,PHARMD AT 2206 12/28/2018 BY L BENFIELD    Streptococcus species NOT DETECTED NOT DETECTED   Streptococcus agalactiae NOT DETECTED NOT DETECTED   Streptococcus pneumoniae NOT DETECTED NOT DETECTED   Streptococcus pyogenes NOT DETECTED NOT DETECTED   Acinetobacter  baumannii NOT DETECTED NOT DETECTED   Enterobacteriaceae species NOT DETECTED NOT DETECTED   Enterobacter cloacae complex NOT DETECTED NOT DETECTED   Escherichia coli NOT DETECTED NOT DETECTED   Klebsiella oxytoca NOT DETECTED NOT DETECTED   Klebsiella pneumoniae NOT DETECTED NOT DETECTED   Proteus species NOT DETECTED NOT DETECTED   Serratia marcescens NOT DETECTED NOT DETECTED   Haemophilus influenzae NOT DETECTED NOT DETECTED    Neisseria meningitidis NOT DETECTED NOT DETECTED   Pseudomonas aeruginosa NOT DETECTED NOT DETECTED   Candida albicans NOT DETECTED NOT DETECTED   Candida glabrata NOT DETECTED NOT DETECTED   Candida krusei NOT DETECTED NOT DETECTED   Candida parapsilosis NOT DETECTED NOT DETECTED   Candida tropicalis NOT DETECTED NOT DETECTED    Comment: Performed at [facility], [address], [place], [place] [address]  HIV Antibody (routine testing w rflx)     Status: None   Collection Time: 12/28/18 10:25 AM  Result Value Ref Range   HIV Screen 4th Generation wRfx NON REACTIVE NON REACTIVE    Comment: Performed at [facility], [address] [place], [place] [address]  C-reactive protein     Status: Abnormal   Collection Time: 12/28/18 10:25 AM  Result Value Ref Range   CRP 19.6 (H) <1.0 mg/dL    Comment: Performed at [facility], [address], [place], [place] [address]  Sedimentation rate     Status: Abnormal   Collection Time: 12/28/18 10:25 AM  Result Value Ref Range   Sed Rate 105 (H) 0 - 16 mm/hr    Comment: Performed at [facility], [address], [place], [place] [address]  Hepatitis panel, acute     Status: None   Collection Time: 12/28/18 10:25 AM  Result Value Ref Range   Hepatitis B Surface Ag NON REACTIVE NON REACTIVE   HCV Ab NON REACTIVE NON REACTIVE    Comment: (NOTE) Nonreactive HCV antibody screen is consistent with no HCV infections,  unless recent infection is suspected or other evidence exists to indicate HCV infection.    Hep A IgM NON REACTIVE NON REACTIVE   Hep B C IgM NON REACTIVE NON REACTIVE    Comment: Performed at [facility], [address], [place], [place] [address]  Lactic acid, plasma     Status: None   Collection Time: 12/28/18 10:26 AM  Result Value Ref Range   Lactic Acid, Venous 1.3 0.5 - 1.9 mmol/L    Comment: Performed at [facility], [address],  [place], [place] [address]  MRSA PCR Screening     Status: Abnormal   Collection Time: 12/28/18 12:51 PM   Specimen: Nasal Mucosa; Nasopharyngeal  Result Value Ref Range   MRSA by PCR POSITIVE (A) NEGATIVE    Comment:        The GeneXpert MRSA Assay (FDA approved for NASAL specimens only), is one component of a comprehensive MRSA colonization surveillance program. It is not intended to diagnose MRSA infection nor to guide or monitor treatment for MRSA infections. RESULT CALLED TO, READ BACK BY AND VERIFIED WITH: K ZULETA,RN 12/28/18 1507 RHOLMES Performed at [facility], [address], [place], [place] [address]   Respiratory Panel by PCR     Status: None   Collection Time: 12/28/18  1:59 PM   Specimen: Nasopharyngeal Swab; Respiratory  Result Value Ref Range   Adenovirus NOT DETECTED NOT DETECTED   Coronavirus 229E NOT  DETECTED NOT DETECTED    Comment: (NOTE) The Coronavirus on the Respiratory Panel, DOES NOT test for the novel  Coronavirus (2019 nCoV)    Coronavirus HKU1 NOT DETECTED NOT DETECTED   Coronavirus NL63 NOT DETECTED NOT DETECTED   Coronavirus OC43 NOT DETECTED NOT DETECTED   Metapneumovirus NOT DETECTED NOT DETECTED   Rhinovirus / Enterovirus NOT DETECTED NOT DETECTED   Influenza A NOT DETECTED NOT DETECTED   Influenza B NOT DETECTED NOT DETECTED   Parainfluenza Virus 1 NOT DETECTED NOT DETECTED   Parainfluenza Virus 2 NOT DETECTED NOT DETECTED   Parainfluenza Virus 3 NOT DETECTED NOT DETECTED   Parainfluenza Virus 4 NOT DETECTED NOT DETECTED   Respiratory Syncytial Virus NOT DETECTED NOT DETECTED   Bordetella pertussis NOT DETECTED NOT DETECTED   Chlamydophila pneumoniae NOT DETECTED NOT DETECTED   Mycoplasma pneumoniae NOT DETECTED NOT DETECTED    Comment: Performed at [facility], [address], [place], [place] [address]  SARS Coronavirus 2 by RT PCR (hospital order, performed in [facility] hospital lab) Nasopharyngeal  Nasopharyngeal Swab     Status: None   Collection Time: 12/28/18  1:59 PM   Specimen: Nasopharyngeal Swab  Result Value Ref Range   SARS Coronavirus 2 NEGATIVE NEGATIVE    Comment: (NOTE) If result is NEGATIVE SARS-CoV-2 target nucleic acids are NOT DETECTED. The SARS-CoV-2 RNA is generally detectable in upper and lower  respiratory specimens during the acute phase of infection. The lowest  concentration of SARS-CoV-2 viral copies this assay can detect is 250  copies / mL. A negative result does not preclude SARS-CoV-2 infection  and should not be used as the sole basis for treatment or other  patient management decisions.  A negative result may occur with  improper specimen collection / handling, submission of specimen other  than nasopharyngeal swab, presence of viral mutation(s) within the  areas targeted by this assay, and inadequate number of viral copies  (<250 copies / mL). A negative result must be combined with clinical  observations, patient history, and epidemiological information. If result is POSITIVE SARS-CoV-2 target nucleic acids are DETECTED. The SARS-CoV-2 RNA is generally detectable in upper and lower  respiratory specimens dur ing the acute phase of infection.  Positive  results are indicative of active infection with SARS-CoV-2.  Clinical  correlation with patient history and other diagnostic information is  necessary to determine patient infection status.  Positive results do  not rule out bacterial infection or co-infection with other viruses. If result is PRESUMPTIVE POSTIVE SARS-CoV-2 nucleic acids MAY BE PRESENT.   A presumptive positive result was obtained on the submitted specimen  and confirmed on repeat testing.  While 2019 novel coronavirus  (SARS-CoV-2) nucleic acids may be present in the submitted sample  additional confirmatory testing may be necessary for epidemiological  and / or clinical management purposes  to differentiate between  SARS-CoV-2  and other Sarbecovirus currently known to infect humans.  If clinically indicated additional testing with an alternate test  methodology [phone]) is advised. The SARS-CoV-2 RNA is generally  detectable in upper and lower respiratory sp ecimens during the acute  phase of infection. The expected result is Negative. Fact Sheet for Patients:  BoilerBrush.com.cy Fact Sheet for Healthcare Providers: https://pope.com/ This test is not yet approved or cleared by the [place] FDA and has been authorized for detection and/or diagnosis of SARS-CoV-2 by FDA under an Emergency Use Authorization (EUA).  This EUA will remain in effect (meaning this test  can be used) for the duration of the COVID-19 declaration under Section 564(b)(1) of the Act, 21 U.S.C. section 360bbb-3(b)(1), unless the authorization is terminated or revoked sooner. Performed at [facility], [address] [place], [place] [address]   CBC     Status: Abnormal   Collection Time: 12/29/18  5:33 AM  Result Value Ref Range   WBC 14.8 (H) 4.0 - 10.5 K/uL   RBC 4.15 (L) 4.22 - 5.81 MIL/uL   Hemoglobin 12.2 (L) 13.0 - 17.0 g/dL   HCT 62.1 (L) 30.8 - 65.7 %   MCV 85.5 80.0 - 100.0 fL   MCH 29.4 26.0 - 34.0 pg   MCHC 34.4 30.0 - 36.0 g/dL   RDW 84.6 96.2 - 95.2 %   Platelets 178 150 - 400 K/uL   nRBC 0.0 0.0 - 0.2 %    Comment: Performed at [facility], [address] [place], [place] [address]  Comprehensive metabolic panel     Status: Abnormal   Collection Time: 12/29/18  6:09 AM  Result Value Ref Range   Sodium 139 135 - 145 mmol/L    Comment: DELTA CHECK NOTED REPEATED TO VERIFY    Potassium 3.7 3.5 - 5.1 mmol/L   Chloride 110 98 - 111 mmol/L   CO2 18 (L) 22 - 32 mmol/L   Glucose, Bld 89 70 - 99 mg/dL   BUN 9 6 - 20 mg/dL   Creatinine, Ser 4.40 0.61 - 1.24 mg/dL   Calcium 8.4 (L) 8.9 - 10.3 mg/dL   Total Protein 6.0 (L) 6.5 -  8.1 g/dL   Albumin 2.5 (L) 3.5 - 5.0 g/dL   AST 26 15 - 41 U/L   ALT 36 0 - 44 U/L   Alkaline Phosphatase 166 (H) 38 - 126 U/L   Total Bilirubin 0.7 0.3 - 1.2 mg/dL   GFR calc non Af Amer >60 >60 mL/min   GFR calc Af Amer >60 >60 mL/min   Anion gap 11 5 - 15    Comment: Performed at [facility], [address], [place], [place] [address]  Magnesium     Status: None   Collection Time: 12/29/18  9:20 AM  Result Value Ref Range   Magnesium 1.9 1.7 - 2.4 mg/dL    Comment: Performed at [facility], [address], [place], [place] [address]  Phosphorus     Status: None   Collection Time: 12/29/18  9:20 AM  Result Value Ref Range   Phosphorus 3.9 2.5 - 4.6 mg/dL    Comment: Performed at [facility], [address], [place], [place] [address]  Glucose, capillary     Status: None   Collection Time: 12/29/18 11:53 AM  Result Value Ref Range   Glucose-Capillary 89 70 - 99 mg/dL      Component Value Date/Time   SDES  12/28/2018 0747    BLOOD RIGHT ARM Performed at [facility], [address], [place], [place] [address]    SDES  12/28/2018 [phone]    BLOOD RIGHT WRIST Performed at [facility], [address] [place], [place] [address]    SDES  12/28/2018 [phone]    IN/OUT CATH URINE Performed at [facility], [address], [place], [place] [address]    SPECREQUEST  12/28/2018 0747    BOTTLES DRAWN AEROBIC AND ANAEROBIC Blood Culture results may not be optimal due to an excessive volume of blood  received in culture bottles Performed at [facility], [address], [place], [place] [address]    SPECREQUEST  12/28/2018 0747    BOTTLES DRAWN AEROBIC AND ANAEROBIC Blood Culture results may not be optimal due to an inadequate volume of blood received in culture bottles Performed at [facility], [address] [place], [place] [address]     SPECREQUEST  12/28/2018 0981    NONE Performed at [facility], [address], [place], [place] [address]    CULT STAPHYLOCOCCUS AUREUS (A) 12/28/2018 0747   CULT STAPHYLOCOCCUS AUREUS (A) 12/28/2018 0747   CULT (A) 12/28/2018 0747    30,000 COLONIES/mL STAPHYLOCOCCUS AUREUS SUSCEPTIBILITIES TO FOLLOW Performed at [facility], [address] [place], [place] [address]    REPTSTATUS PENDING 12/28/2018 0747   REPTSTATUS PENDING 12/28/2018 0747   REPTSTATUS PENDING 12/28/2018 0747   Dg Chest 2 View  Result Date: 12/28/2018 CLINICAL DATA:  Generalized body aches.  Drug abuse. EXAM: CHEST - 2 VIEW COMPARISON:  11/21/2018; 04/26/2014 FINDINGS: Grossly unchanged cardiac silhouette and mediastinal contours. The lungs appear hyperexpanded with flattening of the diaphragms mild diffuse slightly nodular thickening of the pulmonary interstitium. No discrete focal airspace opacities. No pleural effusion or pneumothorax. No evidence of edema. No acute osseous abnormalities. IMPRESSION: Similar findings of lung hyperexpansion and bronchitic change without superimposed acute cardiopulmonary disease. Electronically Signed   By: Simonne Come M.D.   On: 12/28/2018 06:11   US Abdomen Limited Ruq  Result Date: 12/28/2018 CLINICAL DATA:  Elevated liver enzymes. EXAM: ULTRASOUND ABDOMEN LIMITED RIGHT UPPER QUADRANT COMPARISON:  None. FINDINGS: Gallbladder: No gallstones or wall thickening visualized. No sonographic Murphy sign noted by sonographer. Common bile duct: Diameter: 4.2 mm Liver: No focal lesion identified. Within normal limits in parenchymal echogenicity. Portal vein is patent on color Doppler imaging with normal direction of blood flow towards the liver. Other: None. IMPRESSION: Normal study. Electronically Signed   By: Gerome Sam III M.D   On: 12/28/2018 17:09   Recent Results (from the past 240 hour(s))  SARS CORONAVIRUS 2 (TAT 6-24 HRS) Nasopharyngeal Nasopharyngeal  Swab     Status: None   Collection Time: 12/28/18  5:37 AM   Specimen: Nasopharyngeal Swab  Result Value Ref Range Status   SARS Coronavirus 2 NEGATIVE NEGATIVE Final    Comment: (NOTE) SARS-CoV-2 target nucleic acids are NOT DETECTED. The SARS-CoV-2 RNA is generally detectable in upper and lower respiratory specimens during the acute phase of infection. Negative results do not preclude SARS-CoV-2 infection, do not rule out co-infections with other pathogens, and should not be used as the sole basis for treatment or other patient management decisions. Negative results must be combined with clinical observations, patient history, and epidemiological information. The expected result is Negative. Fact Sheet for Patients: HairSlick.no Fact Sheet for Healthcare Providers: quierodirigir.com This test is not yet approved or cleared by the [place] FDA and  has been authorized for detection and/or diagnosis of SARS-CoV-2 by FDA under an Emergency Use Authorization (EUA). This EUA will remain  in effect (meaning this test can be used) for the duration of the COVID-19 declaration under Section 56 4(b)(1) of the Act, 21 U.S.C. section 360bbb-3(b)(1), unless the authorization is terminated or revoked sooner. Performed at [facility], [address], [place], [place] [address]   Blood culture (routine x 2)     Status: Abnormal (Preliminary result)   Collection Time: 12/28/18  7:47 AM  Specimen: BLOOD RIGHT ARM  Result Value Ref Range Status   Specimen Description   Final    BLOOD RIGHT ARM Performed at [facility], [address], [place], [place] [address]    Special Requests   Final    BOTTLES DRAWN AEROBIC AND ANAEROBIC Blood Culture results may not be optimal due to an excessive volume of blood received in culture bottles Performed at [place] [address] [place], [place] [address]     Culture  Setup Time   Final    GRAM POSITIVE COCCI IN BOTH AEROBIC AND ANAEROBIC BOTTLES CRITICAL RESULT CALLED TO, READ BACK BY AND VERIFIED WITH: [place] [id number] FCP CRITICAL RESULT CALLED TO, READ BACK BY AND VERIFIED WITH: B GREEN,PHARMD AT 2206 12/28/2018 BY L BENFIELD Performed at [facility], [place] [address] [place], [place] [address]    Culture STAPHYLOCOCCUS AUREUS (A)  Final   Report Status PENDING  Incomplete  Blood culture (routine x 2)     Status: Abnormal (Preliminary result)   Collection Time: 12/28/18  7:47 AM   Specimen: BLOOD  Result Value Ref Range Status   Specimen Description   Final    BLOOD RIGHT WRIST Performed at [facility] [address], [place], [place] [address]    Special Requests   Final    BOTTLES DRAWN AEROBIC AND ANAEROBIC Blood Culture results may not be optimal due to an inadequate volume of blood received in culture bottles Performed at [place] [address], [place], [place] [address]    Culture  Setup Time   Final    GRAM POSITIVE COCCI IN CLUSTERS IN BOTH AEROBIC AND ANAEROBIC BOTTLES CRITICAL VALUE NOTED.  VALUE IS CONSISTENT WITH PREVIOUSLY REPORTED AND CALLED VALUE. Performed at [facility], [place] [address] [place], [place] [address]    Culture STAPHYLOCOCCUS AUREUS (A)  Final   Report Status PENDING  Incomplete  Urine culture     Status: Abnormal (Preliminary result)   Collection Time: 12/28/18  7:47 AM   Specimen: In/Out Cath Urine  Result Value Ref Range Status   Specimen Description   Final    IN/OUT CATH URINE Performed at [place] [address] [place], [place] [address]    Special Requests   Final    NONE Performed at [facility], [place] [address] [place], [place] [address]    Culture (A)  Final    30,000 COLONIES/mL STAPHYLOCOCCUS AUREUS SUSCEPTIBILITIES TO FOLLOW Performed at [facility], [place] [address], [place], [place] [address]    Report Status PENDING  Incomplete  Blood Culture ID Panel (Reflexed)     Status: Abnormal   Collection Time: 12/28/18  7:47 AM  Result Value Ref Range Status   Enterococcus species NOT DETECTED NOT DETECTED Final   Listeria monocytogenes NOT DETECTED NOT DETECTED Final   Staphylococcus species DETECTED (A) NOT DETECTED Final    Comment: CRITICAL RESULT CALLED TO, READ BACK BY AND VERIFIED WITH: B GREEN,PHARMD AT 2206 12/28/2018 BY L BENFIELD    Staphylococcus aureus (BCID) DETECTED (A) NOT DETECTED Final    Comment: Methicillin (oxacillin)-resistant Staphylococcus aureus (MRSA). MRSA is predictably resistant to beta-lactam antibiotics (except ceftaroline). Preferred therapy is vancomycin unless clinically contraindicated. Patient requires contact precautions if  hospitalized. CRITICAL RESULT CALLED TO, READ BACK BY AND VERIFIED WITH: B GREEN,PHARMD AT 2206 12/28/2018 BY L BENFIELD  Methicillin resistance DETECTED (A) NOT DETECTED Final    Comment: CRITICAL RESULT CALLED TO, READ BACK BY AND VERIFIED WITH: B GREEN,PHARMD AT 2206 12/28/2018 BY L BENFIELD    Streptococcus species NOT DETECTED NOT DETECTED Final   Streptococcus agalactiae NOT DETECTED NOT DETECTED Final   Streptococcus pneumoniae NOT DETECTED NOT DETECTED Final   Streptococcus pyogenes NOT DETECTED NOT DETECTED Final   Acinetobacter baumannii NOT DETECTED NOT DETECTED Final   Enterobacteriaceae species NOT DETECTED NOT DETECTED Final   Enterobacter cloacae complex NOT DETECTED NOT DETECTED Final   Escherichia coli NOT DETECTED NOT DETECTED Final   Klebsiella oxytoca NOT DETECTED NOT DETECTED Final   Klebsiella pneumoniae NOT DETECTED NOT DETECTED Final   Proteus species NOT DETECTED NOT DETECTED Final   Serratia marcescens NOT DETECTED NOT DETECTED Final   Haemophilus influenzae NOT DETECTED NOT DETECTED Final   Neisseria meningitidis NOT DETECTED NOT DETECTED Final   Pseudomonas  aeruginosa NOT DETECTED NOT DETECTED Final   Candida albicans NOT DETECTED NOT DETECTED Final   Candida glabrata NOT DETECTED NOT DETECTED Final   Candida krusei NOT DETECTED NOT DETECTED Final   Candida parapsilosis NOT DETECTED NOT DETECTED Final   Candida tropicalis NOT DETECTED NOT DETECTED Final    Comment: Performed at [facility], [address], [place], [place] [address]  MRSA PCR Screening     Status: Abnormal   Collection Time: 12/28/18 12:51 PM   Specimen: Nasal Mucosa; Nasopharyngeal  Result Value Ref Range Status   MRSA by PCR POSITIVE (A) NEGATIVE Final    Comment:        The GeneXpert MRSA Assay (FDA approved for NASAL specimens only), is one component of a comprehensive MRSA colonization surveillance program. It is not intended to diagnose MRSA infection nor to guide or monitor treatment for MRSA infections. RESULT CALLED TO, READ BACK BY AND VERIFIED WITH: K ZULETA,RN 12/28/18 1507 RHOLMES Performed at [facility], [address], [place], [place] [address]   Respiratory Panel by PCR     Status: None   Collection Time: 12/28/18  1:59 PM   Specimen: Nasopharyngeal Swab; Respiratory  Result Value Ref Range Status   Adenovirus NOT DETECTED NOT DETECTED Final   Coronavirus 229E NOT DETECTED NOT DETECTED Final    Comment: (NOTE) The Coronavirus on the Respiratory Panel, DOES NOT test for the novel  Coronavirus (2019 nCoV)    Coronavirus HKU1 NOT DETECTED NOT DETECTED Final   Coronavirus NL63 NOT DETECTED NOT DETECTED Final   Coronavirus OC43 NOT DETECTED NOT DETECTED Final   Metapneumovirus NOT DETECTED NOT DETECTED Final   Rhinovirus / Enterovirus NOT DETECTED NOT DETECTED Final   Influenza A NOT DETECTED NOT DETECTED Final   Influenza B NOT DETECTED NOT DETECTED Final   Parainfluenza Virus 1 NOT DETECTED NOT DETECTED Final   Parainfluenza Virus 2 NOT DETECTED NOT DETECTED Final   Parainfluenza Virus 3 NOT DETECTED NOT DETECTED  Final   Parainfluenza Virus 4 NOT DETECTED NOT DETECTED Final   Respiratory Syncytial Virus NOT DETECTED NOT DETECTED Final   Bordetella pertussis NOT DETECTED NOT DETECTED Final   Chlamydophila pneumoniae NOT DETECTED NOT DETECTED Final   Mycoplasma pneumoniae NOT DETECTED NOT DETECTED Final    Comment: Performed at [facility], [address], [place], [place] [address]  SARS Coronavirus 2 by RT PCR (hospital order, performed in [facility] hospital lab) Nasopharyngeal Nasopharyngeal Swab     Status: None   Collection Time: 12/28/18  1:59 PM   Specimen: Nasopharyngeal Swab  Result Value Ref Range Status   SARS Coronavirus 2 NEGATIVE NEGATIVE Final    Comment: (NOTE) If result is NEGATIVE SARS-CoV-2 target nucleic acids are NOT DETECTED. The SARS-CoV-2 RNA is generally detectable in upper and lower  respiratory specimens during the acute phase of infection. The lowest  concentration of SARS-CoV-2 viral copies this assay can detect is 250  copies / mL. A negative result does not preclude SARS-CoV-2 infection  and should not be used as the sole basis for treatment or other  patient management decisions.  A negative result may occur with  improper specimen collection / handling, submission of specimen other  than nasopharyngeal swab, presence of viral mutation(s) within the  areas targeted by this assay, and inadequate number of viral copies  (<250 copies / mL). A negative result must be combined with clinical  observations, patient history, and epidemiological information. If result is POSITIVE SARS-CoV-2 target nucleic acids are DETECTED. The SARS-CoV-2 RNA is generally detectable in upper and lower  respiratory specimens dur ing the acute phase of infection.  Positive  results are indicative of active infection with SARS-CoV-2.  Clinical  correlation with patient history and other diagnostic information is  necessary to determine patient infection status.  Positive results do   not rule out bacterial infection or co-infection with other viruses. If result is PRESUMPTIVE POSTIVE SARS-CoV-2 nucleic acids MAY BE PRESENT.   A presumptive positive result was obtained on the submitted specimen  and confirmed on repeat testing.  While 2019 novel coronavirus  (SARS-CoV-2) nucleic acids may be present in the submitted sample  additional confirmatory testing may be necessary for epidemiological  and / or clinical management purposes  to differentiate between  SARS-CoV-2 and other Sarbecovirus currently known to infect humans.  If clinically indicated additional testing with an alternate test  methodology [phone]) is advised. The SARS-CoV-2 RNA is generally  detectable in upper and lower respiratory sp ecimens during the acute  phase of infection. The expected result is Negative. Fact Sheet for Patients:  BoilerBrush.com.cy Fact Sheet for Healthcare Providers: https://pope.com/ This test is not yet approved or cleared by the [place] FDA and has been authorized for detection and/or diagnosis of SARS-CoV-2 by FDA under an Emergency Use Authorization (EUA).  This EUA will remain in effect (meaning this test can be used) for the duration of the COVID-19 declaration under Section 564(b)(1) of the Act, 21 U.S.C. section 360bbb-3(b)(1), unless the authorization is terminated or revoked sooner. Performed at [facility], [address] [place], [place] [address]     Microbiology: Recent Results (from the past 240 hour(s))  SARS CORONAVIRUS 2 (TAT 6-24 HRS) Nasopharyngeal Nasopharyngeal Swab     Status: None   Collection Time: 12/28/18  5:37 AM   Specimen: Nasopharyngeal Swab  Result Value Ref Range Status   SARS Coronavirus 2 NEGATIVE NEGATIVE Final    Comment: (NOTE) SARS-CoV-2 target nucleic acids are NOT DETECTED. The SARS-CoV-2 RNA is generally detectable in upper and lower respiratory  specimens during the acute phase of infection. Negative results do not preclude SARS-CoV-2 infection, do not rule out co-infections with other pathogens, and should not be used as the sole basis for treatment or other patient management decisions. Negative results must be combined with clinical observations, patient history, and epidemiological information. The expected result is Negative. Fact Sheet for Patients: HairSlick.no Fact Sheet for Healthcare Providers: quierodirigir.com This test is not yet approved or cleared by  the [place] FDA and  has been authorized for detection and/or diagnosis of SARS-CoV-2 by FDA under an Emergency Use Authorization (EUA). This EUA will remain  in effect (meaning this test can be used) for the duration of the COVID-19 declaration under Section 56 4(b)(1) of the Act, 21 U.S.C. section 360bbb-3(b)(1), unless the authorization is terminated or revoked sooner. Performed at [facility], [address] [place], [place] [address]   Blood culture (routine x 2)     Status: Abnormal (Preliminary result)   Collection Time: 12/28/18  7:47 AM   Specimen: BLOOD RIGHT ARM  Result Value Ref Range Status   Specimen Description   Final    BLOOD RIGHT ARM Performed at [facility], [address] [place], [place] [address]    Special Requests   Final    BOTTLES DRAWN AEROBIC AND ANAEROBIC Blood Culture results may not be optimal due to an excessive volume of blood received in culture bottles Performed at [facility], [address] [place], [place] [address]    Culture  Setup Time   Final    GRAM POSITIVE COCCI IN BOTH AEROBIC AND ANAEROBIC BOTTLES CRITICAL RESULT CALLED TO, READ BACK BY AND VERIFIED WITH: PHAR[place] JUSTIN L 1948 [id number] FCP CRITICAL RESULT CALLED TO, READ BACK BY AND VERIFIED WITH: B GREEN,PHARMD AT 2206 12/28/2018 BY L BENFIELD Performed at [facility], [address] [place], [place] [address]    Culture STAPHYLOCOCCUS AUREUS (A)  Final   Report Status PENDING  Incomplete  Blood culture (routine x 2)     Status: Abnormal (Preliminary result)   Collection Time: 12/28/18  7:47 AM   Specimen: BLOOD  Result Value Ref Range Status   Specimen Description   Final    BLOOD RIGHT WRIST Performed at [facility], [address] [place], [place] [address]    Special Requests   Final    BOTTLES DRAWN AEROBIC AND ANAEROBIC Blood Culture results may not be optimal due to an inadequate volume of blood received in culture bottles Performed at [facility], [address] [place], [place] [address]    Culture  Setup Time   Final    GRAM POSITIVE COCCI IN CLUSTERS IN BOTH AEROBIC AND ANAEROBIC BOTTLES CRITICAL VALUE NOTED.  VALUE IS CONSISTENT WITH PREVIOUSLY REPORTED AND CALLED VALUE. Performed at [facility], [address] [place], [place] [address]    Culture STAPHYLOCOCCUS AUREUS (A)  Final   Report Status PENDING  Incomplete  Urine culture     Status: Abnormal (Preliminary result)   Collection Time: 12/28/18  7:47 AM   Specimen: In/Out Cath Urine  Result Value Ref Range Status   Specimen Description   Final    IN/OUT CATH URINE Performed at [facility], [address] [place], [place] [address]    Special Requests   Final    NONE Performed at [facility], [address] [place], [place] [address]    Culture (A)  Final    30,000 COLONIES/mL STAPHYLOCOCCUS AUREUS SUSCEPTIBILITIES TO FOLLOW Performed at [facility], [address] [place], [place] [address]    Report Status PENDING  Incomplete  Blood Culture ID Panel (Reflexed)     Status: Abnormal   Collection Time: 12/28/18  7:47 AM  Result Value Ref Range Status   Enterococcus species NOT DETECTED NOT DETECTED Final   Listeria monocytogenes NOT  DETECTED NOT DETECTED Final    Staphylococcus species DETECTED (A) NOT DETECTED Final    Comment: CRITICAL RESULT CALLED TO, READ BACK BY AND VERIFIED WITH: B GREEN,PHARMD AT 2206 12/28/2018 BY L BENFIELD    Staphylococcus aureus (BCID) DETECTED (A) NOT DETECTED Final    Comment: Methicillin (oxacillin)-resistant Staphylococcus aureus (MRSA). MRSA is predictably resistant to beta-lactam antibiotics (except ceftaroline). Preferred therapy is vancomycin unless clinically contraindicated. Patient requires contact precautions if  hospitalized. CRITICAL RESULT CALLED TO, READ BACK BY AND VERIFIED WITH: B GREEN,PHARMD AT 2206 12/28/2018 BY L BENFIELD    Methicillin resistance DETECTED (A) NOT DETECTED Final    Comment: CRITICAL RESULT CALLED TO, READ BACK BY AND VERIFIED WITH: B GREEN,PHARMD AT 2206 12/28/2018 BY L BENFIELD    Streptococcus species NOT DETECTED NOT DETECTED Final   Streptococcus agalactiae NOT DETECTED NOT DETECTED Final   Streptococcus pneumoniae NOT DETECTED NOT DETECTED Final   Streptococcus pyogenes NOT DETECTED NOT DETECTED Final   Acinetobacter baumannii NOT DETECTED NOT DETECTED Final   Enterobacteriaceae species NOT DETECTED NOT DETECTED Final   Enterobacter cloacae complex NOT DETECTED NOT DETECTED Final   Escherichia coli NOT DETECTED NOT DETECTED Final   Klebsiella oxytoca NOT DETECTED NOT DETECTED Final   Klebsiella pneumoniae NOT DETECTED NOT DETECTED Final   Proteus species NOT DETECTED NOT DETECTED Final   Serratia marcescens NOT DETECTED NOT DETECTED Final   Haemophilus influenzae NOT DETECTED NOT DETECTED Final   Neisseria meningitidis NOT DETECTED NOT DETECTED Final   Pseudomonas aeruginosa NOT DETECTED NOT DETECTED Final   Candida albicans NOT DETECTED NOT DETECTED Final   Candida glabrata NOT DETECTED NOT DETECTED Final   Candida krusei NOT DETECTED NOT DETECTED Final   Candida parapsilosis NOT DETECTED NOT DETECTED Final   Candida tropicalis NOT DETECTED NOT DETECTED Final     Comment: Performed at [facility], [address], [place], [place] [address]  MRSA PCR Screening     Status: Abnormal   Collection Time: 12/28/18 12:51 PM   Specimen: Nasal Mucosa; Nasopharyngeal  Result Value Ref Range Status   MRSA by PCR POSITIVE (A) NEGATIVE Final    Comment:        The GeneXpert MRSA Assay (FDA approved for NASAL specimens only), is one component of a comprehensive MRSA colonization surveillance program. It is not intended to diagnose MRSA infection nor to guide or monitor treatment for MRSA infections. RESULT CALLED TO, READ BACK BY AND VERIFIED WITH: K ZULETA,RN 12/28/18 1507 RHOLMES Performed at [facility], [address], [place], [place] [address]   Respiratory Panel by PCR     Status: None   Collection Time: 12/28/18  1:59 PM   Specimen: Nasopharyngeal Swab; Respiratory  Result Value Ref Range Status   Adenovirus NOT DETECTED NOT DETECTED Final   Coronavirus 229E NOT DETECTED NOT DETECTED Final    Comment: (NOTE) The Coronavirus on the Respiratory Panel, DOES NOT test for the novel  Coronavirus (2019 nCoV)    Coronavirus HKU1 NOT DETECTED NOT DETECTED Final   Coronavirus NL63 NOT DETECTED NOT DETECTED Final   Coronavirus OC43 NOT DETECTED NOT DETECTED Final   Metapneumovirus NOT DETECTED NOT DETECTED Final   Rhinovirus / Enterovirus NOT DETECTED NOT DETECTED Final   Influenza A NOT DETECTED NOT DETECTED Final   Influenza B NOT DETECTED NOT DETECTED Final   Parainfluenza Virus 1 NOT DETECTED NOT DETECTED Final   Parainfluenza Virus 2 NOT DETECTED NOT DETECTED Final   Parainfluenza Virus  3 NOT DETECTED NOT DETECTED Final   Parainfluenza Virus 4 NOT DETECTED NOT DETECTED Final   Respiratory Syncytial Virus NOT DETECTED NOT DETECTED Final   Bordetella pertussis NOT DETECTED NOT DETECTED Final   Chlamydophila pneumoniae NOT DETECTED NOT DETECTED Final   Mycoplasma pneumoniae NOT DETECTED NOT DETECTED Final    Comment:  Performed at [facility], [address], [place], [place] [address]  SARS Coronavirus 2 by RT PCR (hospital order, performed in [facility] hospital lab) Nasopharyngeal Nasopharyngeal Swab     Status: None   Collection Time: 12/28/18  1:59 PM   Specimen: Nasopharyngeal Swab  Result Value Ref Range Status   SARS Coronavirus 2 NEGATIVE NEGATIVE Final    Comment: (NOTE) If result is NEGATIVE SARS-CoV-2 target nucleic acids are NOT DETECTED. The SARS-CoV-2 RNA is generally detectable in upper and lower  respiratory specimens during the acute phase of infection. The lowest  concentration of SARS-CoV-2 viral copies this assay can detect is 250  copies / mL. A negative result does not preclude SARS-CoV-2 infection  and should not be used as the sole basis for treatment or other  patient management decisions.  A negative result may occur with  improper specimen collection / handling, submission of specimen other  than nasopharyngeal swab, presence of viral mutation(s) within the  areas targeted by this assay, and inadequate number of viral copies  (<250 copies / mL). A negative result must be combined with clinical  observations, patient history, and epidemiological information. If result is POSITIVE SARS-CoV-2 target nucleic acids are DETECTED. The SARS-CoV-2 RNA is generally detectable in upper and lower  respiratory specimens dur ing the acute phase of infection.  Positive  results are indicative of active infection with SARS-CoV-2.  Clinical  correlation with patient history and other diagnostic information is  necessary to determine patient infection status.  Positive results do  not rule out bacterial infection or co-infection with other viruses. If result is PRESUMPTIVE POSTIVE SARS-CoV-2 nucleic acids MAY BE PRESENT.   A presumptive positive result was obtained on the submitted specimen  and confirmed on repeat testing.  While 2019 novel coronavirus  (SARS-CoV-2) nucleic  acids may be present in the submitted sample  additional confirmatory testing may be necessary for epidemiological  and / or clinical management purposes  to differentiate between  SARS-CoV-2 and other Sarbecovirus currently known to infect humans.  If clinically indicated additional testing with an alternate test  methodology [phone]) is advised. The SARS-CoV-2 RNA is generally  detectable in upper and lower respiratory sp ecimens during the acute  phase of infection. The expected result is Negative. Fact Sheet for Patients:  BoilerBrush.com.cy Fact Sheet for Healthcare Providers: https://pope.com/ This test is not yet approved or cleared by the [place] FDA and has been authorized for detection and/or diagnosis of SARS-CoV-2 by FDA under an Emergency Use Authorization (EUA).  This EUA will remain in effect (meaning this test can be used) for the duration of the COVID-19 declaration under Section 564(b)(1) of the Act, 21 U.S.C. section 360bbb-3(b)(1), unless the authorization is terminated or revoked sooner. Performed at [facility], [address], [place], [place] [address]     Radiographs and labs were personally reviewed by me.   Johny Sax, MD [facility] [phone] 12/29/2018, 3:07 PM

## 2018-12-29 NOTE — Progress Notes (Signed)
PROGRESS NOTE    Alex [name]  PYK:[id number] DOB: [birth year] DOA: 12/28/2018 PCP: Patient, No Pcp Per   Brief Narrative:  33 year old with history of alcohol abuse, polysubstance abuse, asthma came to the hospital complains of generalized body aches and chills.  Uses IV heroine and methamphetamine.  Last meth use was about a week ago and heroin use was prior to ED arrival.  He was noted to be febrile and sepsis without unknown etiology.  UDS was positive for amphetamine, opioids.   Assessment & Plan:   Principal Problem:   Sepsis (HCC) Active Problems:   Tobacco use disorder   Polysubstance dependence (HCC)   Substance induced mood disorder (HCC)  Sepsis without shock, likely from IV drug use Gram-positive bacteremia AMS -MRSA screen positive.  Blood cultures growing gram-positive cocci -Continue IV vancomycin and cefepime.  D/c Cefepime in 24 hrs.  -Check echocardiogram to rule out IE -IV fluids -Inflammatory markers elevated as expected -Check CT head due to altered mental status. -Keep him n.p.o. Accuchecks q6hrs.   Hyponatremia -Resolved with IV fluids.  This morning is 139  Transaminitis -Trending downwards.  Continue to monitor. -Right upper quadrant-normal  Polysubstance abuse with concerns of active withdrawal Tobacco use -Withdrawal protocol. -Librium 3 times daily -Nicotine patch  History of asthma -As needed bronchodilators  Penile lesion -STI work-up ordered  DVT prophylaxis: Lovenox Code Status: Full Family Communication: Spoke with his Father  Disposition Plan: Maintain hospital stay for IV antibiotics secondary to ongoing bacteremia and its evaluation  Consultants:   PCCM  Procedures:   None  Antimicrobials:   Vancomycin day 2  Cefepime day 2   Subjective: Confused, moving all the extremities. No meaning interaction.   Review of Systems Otherwise negative except as per HPI, including: unble to perform.  Objective: Vitals:    12/29/18 0400 12/29/18 0500 12/29/18 0600 12/29/18 0700  BP: 119/73 111/75 127/69 121/84  Pulse: (!) 101 98 81 84  Resp: (!) 34 (!) 26 20 (!) 21  Temp: 99.7 F (37.6 C) 99.7 F (37.6 C) 99.7 F (37.6 C) 99.7 F (37.6 C)  TempSrc:      SpO2: 98% 99% 100% 100%  Weight:      Height:        Intake/Output Summary (Last 24 hours) at 12/29/2018 0746 Last data filed at 12/29/2018 0600 Gross per 24 hour  Intake 4828.41 ml  Output 1990 ml  Net 2838.41 ml   Filed Weights   12/28/18 0040 12/28/18 1254  Weight: 56.7 kg 54.3 kg    Examination:  General exam: Currently in restraints.  Mumbles when called by his name. Respiratory system: Clear to auscultation. Respiratory effort normal. Cardiovascular system: S1 & S2 heard, RRR. No JVD, murmurs, rubs, gallops or clicks. No pedal edema. Gastrointestinal system: Abdomen is nondistended, soft and nontender. No organomegaly or masses felt. Normal bowel sounds heard. Central nervous system: Mumbles to his name and answers head towards voice commands.  Moving all the extremities grossly. Extremities: Grossly moving all the extremities Skin: Penile lesion Psychiatry: Unable to assess    Data Reviewed:   CBC: Recent Labs  Lab 12/28/18 0630 12/29/18 0533  WBC 13.3* 14.8*  NEUTROABS 11.1*  --   HGB 11.8* 12.2*  HCT 35.7* 35.5*  MCV 89.0 85.5  PLT 208 178   Basic Metabolic Panel: Recent Labs  Lab 12/28/18 0630 12/29/18 0609  NA 130* 139  K 3.9 3.7  CL 95* 110  CO2 22 18*  GLUCOSE 110* 89  BUN 8 9  CREATININE 0.70 0.62  CALCIUM 8.7* 8.4*   GFR: Estimated Creatinine Clearance: 100.9 mL/min (by C-G formula based on SCr of 0.62 mg/dL). Liver Function Tests: Recent Labs  Lab 12/28/18 0630 12/29/18 0609  AST 57* 26  ALT 57* 36  ALKPHOS 231* 166*  BILITOT 0.8 0.7  PROT 7.0 6.0*  ALBUMIN 2.9* 2.5*   No results for input(s): LIPASE, AMYLASE in the last 168 hours. No results for input(s): AMMONIA in the last 168  hours. Coagulation Profile: Recent Labs  Lab 12/28/18 0747  INR 1.2   Cardiac Enzymes: Recent Labs  Lab 12/28/18 0630  CKTOTAL 52   BNP (last 3 results) No results for input(s): PROBNP in the last 8760 hours. HbA1C: No results for input(s): HGBA1C in the last 72 hours. CBG: No results for input(s): GLUCAP in the last 168 hours. Lipid Profile: No results for input(s): CHOL, HDL, LDLCALC, TRIG, CHOLHDL, LDLDIRECT in the last 72 hours. Thyroid Function Tests: No results for input(s): TSH, T4TOTAL, FREET4, T3FREE, THYROIDAB in the last 72 hours. Anemia Panel: No results for input(s): VITAMINB12, FOLATE, FERRITIN, TIBC, IRON, RETICCTPCT in the last 72 hours. Sepsis Labs: Recent Labs  Lab 12/28/18 0747 12/28/18 1026  LATICACIDVEN 1.0 1.3    Recent Results (from the past 240 hour(s))  SARS CORONAVIRUS 2 (TAT 6-24 HRS) Nasopharyngeal Nasopharyngeal Swab     Status: None   Collection Time: 12/28/18  5:37 AM   Specimen: Nasopharyngeal Swab  Result Value Ref Range Status   SARS Coronavirus 2 NEGATIVE NEGATIVE Final    Comment: (NOTE) SARS-CoV-2 target nucleic acids are NOT DETECTED. The SARS-CoV-2 RNA is generally detectable in upper and lower respiratory specimens during the acute phase of infection. Negative results do not preclude SARS-CoV-2 infection, do not rule out co-infections with other pathogens, and should not be used as the sole basis for treatment or other patient management decisions. Negative results must be combined with clinical observations, patient history, and epidemiological information. The expected result is Negative. Fact Sheet for Patients: SugarRoll.be Fact Sheet for Healthcare Providers: https://www.woods-mathews.com/ This test is not yet approved or cleared by the [place] FDA and  has been authorized for detection and/or diagnosis of SARS-CoV-2 by FDA under an Emergency Use Authorization (EUA). This  EUA will remain  in effect (meaning this test can be used) for the duration of the COVID-19 declaration under Section 56 4(b)(1) of the Act, 21 U.S.C. section 360bbb-3(b)(1), unless the authorization is terminated or revoked sooner. Performed at [facility], [place] [address], [place], [place] [address]   Blood culture (routine x 2)     Status: None (Preliminary result)   Collection Time: 12/28/18  7:47 AM   Specimen: BLOOD RIGHT ARM  Result Value Ref Range Status   Specimen Description   Final    BLOOD RIGHT ARM Performed at [facility], [address] [place], [place] [address]    Special Requests   Final    BOTTLES DRAWN AEROBIC AND ANAEROBIC Blood Culture results may not be optimal due to an excessive volume of blood received in culture bottles Performed at [place] [address], [place], [place] [address]    Culture  Setup Time   Final    GRAM POSITIVE COCCI IN BOTH AEROBIC AND ANAEROBIC BOTTLES CRITICAL RESULT CALLED TO, READ BACK BY AND VERIFIED WITH: [place]ak[place] [id number] FCP CRITICAL RESULT CALLED TO, READ BACK BY AND VERIFIED WITH: B GREEN,PHARMD  AT 2206 12/28/2018 BY L BENFIELD Performed at [facility], [address] [place], [place] [address]    Culture GRAM POSITIVE COCCI  Final   Report Status PENDING  Incomplete  Blood culture (routine x 2)     Status: None (Preliminary result)   Collection Time: 12/28/18  7:47 AM   Specimen: BLOOD  Result Value Ref Range Status   Specimen Description   Final    BLOOD RIGHT WRIST Performed at [facility], [address] [place], [place] [address]    Special Requests   Final    BOTTLES DRAWN AEROBIC AND ANAEROBIC Blood Culture results may not be optimal due to an inadequate volume of blood received in culture bottles Performed at [facility], [address] [place], [place] [address]    Culture  Setup Time   Final    GRAM POSITIVE  COCCI IN CLUSTERS IN BOTH AEROBIC AND ANAEROBIC BOTTLES CRITICAL VALUE NOTED.  VALUE IS CONSISTENT WITH PREVIOUSLY REPORTED AND CALLED VALUE. Performed at [facility], [address] [place], [place] [address]    Culture GRAM POSITIVE COCCI  Final   Report Status PENDING  Incomplete  MRSA PCR Screening     Status: Abnormal   Collection Time: 12/28/18 12:51 PM   Specimen: Nasal Mucosa; Nasopharyngeal  Result Value Ref Range Status   MRSA by PCR POSITIVE (A) NEGATIVE Final    Comment:        The GeneXpert MRSA Assay (FDA approved for NASAL specimens only), is one component of a comprehensive MRSA colonization surveillance program. It is not intended to diagnose MRSA infection nor to guide or monitor treatment for MRSA infections. RESULT CALLED TO, READ BACK BY AND VERIFIED WITH: K ZULETA,RN 12/28/18 1507 RHOLMES Performed at [facility], [address] [place], [place] [address]   Respiratory Panel by PCR     Status: None   Collection Time: 12/28/18  1:59 PM   Specimen: Nasopharyngeal Swab; Respiratory  Result Value Ref Range Status   Adenovirus NOT DETECTED NOT DETECTED Final   Coronavirus 229E NOT DETECTED NOT DETECTED Final    Comment: (NOTE) The Coronavirus on the Respiratory Panel, DOES NOT test for the novel  Coronavirus (2019 nCoV)    Coronavirus HKU1 NOT DETECTED NOT DETECTED Final   Coronavirus NL63 NOT DETECTED NOT DETECTED Final   Coronavirus OC43 NOT DETECTED NOT DETECTED Final   Metapneumovirus NOT DETECTED NOT DETECTED Final   Rhinovirus / Enterovirus NOT DETECTED NOT DETECTED Final   Influenza A NOT DETECTED NOT DETECTED Final   Influenza B NOT DETECTED NOT DETECTED Final   Parainfluenza Virus 1 NOT DETECTED NOT DETECTED Final   Parainfluenza Virus 2 NOT DETECTED NOT DETECTED Final   Parainfluenza Virus 3 NOT DETECTED NOT DETECTED Final   Parainfluenza Virus 4 NOT DETECTED NOT DETECTED Final   Respiratory Syncytial Virus NOT  DETECTED NOT DETECTED Final   Bordetella pertussis NOT DETECTED NOT DETECTED Final   Chlamydophila pneumoniae NOT DETECTED NOT DETECTED Final   Mycoplasma pneumoniae NOT DETECTED NOT DETECTED Final    Comment: Performed at [facility], [address] [place], [place] [address]  SARS Coronavirus 2 by RT PCR (hospital order, performed in [facility]) Nasopharyngeal Nasopharyngeal Swab     Status: None   Collection Time: 12/28/18  1:59 PM   Specimen: Nasopharyngeal Swab  Result Value Ref Range Status   SARS Coronavirus 2 NEGATIVE NEGATIVE Final  Comment: (NOTE) If result is NEGATIVE SARS-CoV-2 target nucleic acids are NOT DETECTED. The SARS-CoV-2 RNA is generally detectable in upper and lower  respiratory specimens during the acute phase of infection. The lowest  concentration of SARS-CoV-2 viral copies this assay can detect is 250  copies / mL. A negative result does not preclude SARS-CoV-2 infection  and should not be used as the sole basis for treatment or other  patient management decisions.  A negative result may occur with  improper specimen collection / handling, submission of specimen other  than nasopharyngeal swab, presence of viral mutation(s) within the  areas targeted by this assay, and inadequate number of viral copies  (<250 copies / mL). A negative result must be combined with clinical  observations, patient history, and epidemiological information. If result is POSITIVE SARS-CoV-2 target nucleic acids are DETECTED. The SARS-CoV-2 RNA is generally detectable in upper and lower  respiratory specimens dur ing the acute phase of infection.  Positive  results are indicative of active infection with SARS-CoV-2.  Clinical  correlation with patient history and other diagnostic information is  necessary to determine patient infection status.  Positive results do  not rule out bacterial infection or co-infection with other viruses. If result is PRESUMPTIVE  POSTIVE SARS-CoV-2 nucleic acids MAY BE PRESENT.   A presumptive positive result was obtained on the submitted specimen  and confirmed on repeat testing.  While 2019 novel coronavirus  (SARS-CoV-2) nucleic acids may be present in the submitted sample  additional confirmatory testing may be necessary for epidemiological  and / or clinical management purposes  to differentiate between  SARS-CoV-2 and other Sarbecovirus currently known to infect humans.  If clinically indicated additional testing with an alternate test  methodology [phone]) is advised. The SARS-CoV-2 RNA is generally  detectable in upper and lower respiratory sp ecimens during the acute  phase of infection. The expected result is Negative. Fact Sheet for Patients:  BoilerBrush.com.cy Fact Sheet for Healthcare Providers: https://pope.com/ This test is not yet approved or cleared by the [place] FDA and has been authorized for detection and/or diagnosis of SARS-CoV-2 by FDA under an Emergency Use Authorization (EUA).  This EUA will remain in effect (meaning this test can be used) for the duration of the COVID-19 declaration under Section 564(b)(1) of the Act, 21 U.S.C. section 360bbb-3(b)(1), unless the authorization is terminated or revoked sooner. Performed at [facility], [address], [place], [place] [address]          Radiology Studies: Dg Chest 2 View  Result Date: 12/28/2018 CLINICAL DATA:  Generalized body aches.  Drug abuse. EXAM: CHEST - 2 VIEW COMPARISON:  11/21/2018; 04/26/2014 FINDINGS: Grossly unchanged cardiac silhouette and mediastinal contours. The lungs appear hyperexpanded with flattening of the diaphragms mild diffuse slightly nodular thickening of the pulmonary interstitium. No discrete focal airspace opacities. No pleural effusion or pneumothorax. No evidence of edema. No acute osseous abnormalities. IMPRESSION: Similar  findings of lung hyperexpansion and bronchitic change without superimposed acute cardiopulmonary disease. Electronically Signed   By: Simonne Come M.D.   On: 12/28/2018 06:11   US Abdomen Limited Ruq  Result Date: 12/28/2018 CLINICAL DATA:  Elevated liver enzymes. EXAM: ULTRASOUND ABDOMEN LIMITED RIGHT UPPER QUADRANT COMPARISON:  None. FINDINGS: Gallbladder: No gallstones or wall thickening visualized. No sonographic Murphy sign noted by sonographer. Common bile duct: Diameter: 4.2 mm Liver: No focal lesion identified. Within normal limits in parenchymal echogenicity. Portal vein is patent on color Doppler imaging with normal direction  of blood flow towards the liver. Other: None. IMPRESSION: Normal study. Electronically Signed   By: Gerome Sam III M.D   On: 12/28/2018 17:09        Scheduled Meds:  chlordiazePOXIDE  25 mg Oral TID   Chlorhexidine Gluconate Cloth  6 each Topical Q0600   enoxaparin (LOVENOX) injection  40 mg Subcutaneous Q24H   LORazepam  0-4 mg Intravenous Q6H   Or   LORazepam  0-4 mg Oral Q6H   [START ON 12/30/2018] LORazepam  0-4 mg Intravenous Q12H   Or   [START ON 12/30/2018] LORazepam  0-4 mg Oral Q12H   LORazepam  10 mg Intravenous Once   mouth rinse  15 mL Mouth Rinse BID   mupirocin ointment  1 application Nasal BID   nicotine  21 mg Transdermal Daily   Continuous Infusions:  sodium chloride 100 mL/hr at 12/29/18 0600   ceFEPime (MAXIPIME) IV Stopped (12/29/18 0600)   vancomycin Stopped (12/29/18 0641)     LOS: 1 day   Time spent= 45 mins    Torra Pala Joline Maxcy, MD [facility]  If 7PM-7AM, please contact night-coverage  12/29/2018, 7:46 AM

## 2018-12-29 NOTE — Consult Note (Signed)
NAME:  Alex [name], MRN:  [id number], DOB:  [birth year], LOS: 1 ADMISSION DATE:  12/28/2018, CONSULTATION DATE: 12/28/2018 REFERRING MD: Dr. Sharolyn Douglas, CHIEF COMPLAINT: Agitation  Brief History   33 year old gentleman admitted to the intensive care unit for polysubstance abuse and agitation history of alcohol use methamphetamines and opiates.  History of present illness   33 year old gentleman admitted to the intensive care unit for polysubstance abuse and agitation.  Patient has a past medical history of depression, alcohol abuse and polysubstance abuse.  Per documentation use of methamphetamines as well as opiates.  And alcohol.  The patient presented to the emergency room at approximately 12:30 AM this morning.  Complaints of generalized body aches.  He has a history of IV drug use and the patient was febrile.  Patient also complained on admission to having chills and fevers for the past few days.  Has worsening shortness of breath chest and abdominal pain.  No diarrhea per documentation.  He reported then that his last HIV screen was negative.  He is also been losing weight over the past 2 months per ED documentation.  Per chart review he has history of major depressive disorder with polysubstance dependence, tobacco abuse.  Review of systems in the ED during a time in which she was able to answer questions was positive for fevers chills shortness of breath and myalgias.  Past Medical History   Past Medical History:  Diagnosis Date  . ADHD (attention deficit hyperactivity disorder)   . Allergy   . Asthma   . Depressed   . ETOH abuse   . History of suicidal tendencies    Significant Hospital Events   Stepdown unit admission  Consults:  Pulmonary critical care  Procedures:    Significant Diagnostic Tests:  ECHO: pending   Micro Data:  COVID-19: Negative  Blood cultures: +MRSA 4/4  Antimicrobials:  Cefepime Vancomycin  Interim history/subjective:  Stable overnight. In  restraints. Still confused. BCX positive for MRSA 4/4   Objective   Blood pressure 121/84, pulse 79, temperature 99.7 F (37.6 C), resp. rate (!) 21, height 6' (1.829 m), weight 54.3 kg, SpO2 100 %.        Intake/Output Summary (Last 24 hours) at 12/29/2018 0806 Last data filed at 12/29/2018 0600 Gross per 24 hour  Intake 4828.41 ml  Output 1990 ml  Net 2838.41 ml   Filed Weights   12/28/18 0040 12/28/18 1254  Weight: 56.7 kg 54.3 kg    Examination: General: young male, in restraints, still confused HENT: NCAT, poor dentition  Lungs: CTAB, no wheeze  Cardiovascular: RRR, s1 s2, no mrg  Abdomen: soft, nt nd  Extremities: thin, muscle wasting  Neuro: responds to pain GU: foley in place   Resolved Hospital Problem list     Assessment & Plan:   Acute metabolic encephalopathy Acute toxic encephalopathy Possible alcohol withdrawal, delirium tremens Possible drug overdose - continue longacting BDZs and PRNS  - slowly he seems to be improving  - continue PRN haldol  - appears clear of needs for intubation risk at this time   Severe Sepsis, MRSA Bacteremia, associated encephalopathy  - would stop cefepime  - continue vancomycin  - consider ID consultation  - ECHO pending to r/o IE - He will need repeat BCx to document sterility   Penile Lesions - per primary - send RPR, HSV, GC/Chly   Best practice:   Diet: N.p.o. Pain/Anxiety/Delirium protocol (if indicated): n/a VAP protocol (if indicated): n/a DVT prophylaxis: na GI prophylaxis:  na Glucose control: na Mobility: na Code Status: full  Family Communication: Per [facility] Disposition: Stepdown unit  Labs    CBC: Recent Labs  Lab 12/28/18 0630 12/29/18 0533  WBC 13.3* 14.8*  NEUTROABS 11.1*  --   HGB 11.8* 12.2*  HCT 35.7* 35.5*  MCV 89.0 85.5  PLT 208 631    Basic Metabolic Panel: Recent Labs  Lab 12/28/18 0630 12/29/18 0609  NA 130* 139  K 3.9 3.7  CL 95* 110  CO2 22 18*  GLUCOSE 110* 89   BUN 8 9  CREATININE 0.70 0.62  CALCIUM 8.7* 8.4*   GFR: Estimated Creatinine Clearance: 100.9 mL/min (by C-G formula based on SCr of 0.62 mg/dL). Recent Labs  Lab 12/28/18 0630 12/28/18 0747 12/28/18 1026 12/29/18 0533  WBC 13.3*  --   --  14.8*  LATICACIDVEN  --  1.0 1.3  --     Liver Function Tests: Recent Labs  Lab 12/28/18 0630 12/29/18 0609  AST 57* 26  ALT 57* 36  ALKPHOS 231* 166*  BILITOT 0.8 0.7  PROT 7.0 6.0*  ALBUMIN 2.9* 2.5*   No results for input(s): LIPASE, AMYLASE in the last 168 hours. No results for input(s): AMMONIA in the last 168 hours.  ABG No results found for: PHART, PCO2ART, PO2ART, HCO3, TCO2, ACIDBASEDEF, O2SAT   Coagulation Profile: Recent Labs  Lab 12/28/18 0747  INR 1.2    Cardiac Enzymes: Recent Labs  Lab 12/28/18 0630  CKTOTAL 52    HbA1C: No results found for: HGBA1C  CBG: No results for input(s): GLUCAP in the last 168 hours.  Review of Systems:   Unable to be obtained secondary to altered mental status.  Past Medical History  He,  has a past medical history of ADHD (attention deficit hyperactivity disorder), Allergy, Asthma, Depressed, ETOH abuse, and History of suicidal tendencies.   Surgical History   History reviewed. No pertinent surgical history.   Social History   reports that he has been smoking cigarettes. He has a 5.00 pack-year smoking history. He has never used smokeless tobacco. He reports current alcohol use of about 12.0 standard drinks of alcohol per week. He reports current drug use. Frequency: 6.00 times per week. Drugs: Marijuana and Cocaine.   Family History   His family history includes COPD in an other family member.   Allergies No Known Allergies   Home Medications  Prior to Admission medications   Medication Sig Start Date End Date Taking? Authorizing Provider  albuterol (PROVENTIL HFA) 108 (90 Base) MCG/ACT inhaler Inhale 1-2 puffs into the lungs every 4 (four) hours as needed for  wheezing or shortness of breath. 09/08/18  Yes Wieters, Hallie C, PA-C  albuterol (PROVENTIL) (2.5 MG/3ML) 0.083% nebulizer solution Take 3 mLs (2.5 mg total) by nebulization every 6 (six) hours as needed for wheezing or shortness of breath. 09/08/18  Yes Wieters, Hallie C, PA-C  hydroxypropyl methylcellulose / hypromellose (ISOPTO TEARS / GONIOVISC) 2.5 % ophthalmic solution Place 1 drop into both eyes 3 (three) times daily as needed for dry eyes.   Yes [provider]  naproxen sodium (ALEVE) 220 MG tablet Take 440 mg by mouth 2 (two) times daily as needed (pain).   Yes [provider]  ipratropium-albuterol (DUONEB) 0.5-2.5 (3) MG/3ML SOLN Take 3 mLs by nebulization every 6 (six) hours as needed (wheezing). Patient not taking: Reported on 12/28/2018 04/14/18 12/28/18  Charlann Lange, PA-C  montelukast (SINGULAIR) 10 MG tablet Take 1 tablet (10 mg total) by mouth  at bedtime. Patient not taking: Reported on 12/28/2018 07/25/17 12/28/18  Georgetta HaberBurky, Natalie B, NP  traZODone (DESYREL) 50 MG tablet Take 1 tablet (50 mg total) by mouth at bedtime and may repeat dose one time if needed. Patient not taking: Reported on 07/05/2018 04/29/16 12/28/18  Oneta RackLewis, Tanika N, NP     This patient is critically ill with multiple organ system failure; which, requires frequent high complexity decision making, assessment, support, evaluation, and titration of therapies. This was completed through the application of advanced monitoring technologies and extensive interpretation of multiple databases. During this encounter critical care time was devoted to patient care services described in this note for 32 minutes.  Josephine IgoBradley L Ardene Remley, DO [facility] 12/29/2018 8:07 AM  Personal pager: [phone] If unanswered, please page [facility]CM On-call: #[phone]

## 2018-12-29 NOTE — H&P (View-Only) (Signed)
°   ° ° ° ° °[facility]   ° °Date of Admission:  12/28/2018   Total days of antibiotics: 1 vanco °        °      °Reason for Consult: Staph aureus bacteremia    °Referring Provider: CHAMP ° ° °Assessment: °Staph aureus bacteremia °IVDA °ETOH abuse ° °Plan: °1. continue vanco °2. Stop cefepime °3. Check gc/chalmydia, RPR °4. Repeat BCx °5. He did not tolerate TTE due to agitation. Repeat when he is able °6. Treat for withdrawal ° °Comment- °He is at very high risk for endocarditis.  °Will need to wait for imaging of his CV °Will need to get his detox hx when he is more cooperative.  ° °Thank you so much for this interesting consult, ° °Principal Problem: °  Sepsis (HCC) °Active Problems: °  Tobacco use disorder °  Polysubstance dependence (HCC) °  Substance induced mood disorder (HCC) ° ° °• chlordiazePOXIDE  10 mg Oral TID  °• Chlorhexidine Gluconate Cloth  6 each Topical Q0600  °• enoxaparin (LOVENOX) injection  40 mg Subcutaneous Q24H  °• folic acid  1 mg Oral Daily  °• mouth rinse  15 mL Mouth Rinse BID  °• multivitamin with minerals  1 tablet Oral Daily  °• mupirocin ointment  1 application Nasal BID  °• nicotine  21 mg Transdermal Daily  °• thiamine  100 mg Oral Daily  ° Or  °• thiamine  100 mg Intravenous Daily  ° ° °HPI: Alex [name] is a 33 y.o. male with hx of IVDA, ETOH abuse, admon 10-18 with myalgias. He attempted to improve this with heroin without relief. He developed worsening SOB as well, CP and abd pain.  °He has lost 10# as well.  °He has been using heroin/methamphetamine/ETOH.  °In Ed he was found to have temp 101.2,  WBC 13, CXR with no acute disease. He was started on vanoc/cefepime. His BCx 4/4 have since grown staph aureus and his UCx is 30k S aureus.  ° °His HIV and viral hepatitis panel is negative.  ° °Review of Systems: °Review of Systems  °Unable to perform ROS: Mental status change  °Constitutional: Positive for fever.  ° ° °Past Medical History:    °Diagnosis Date  °• ADHD (attention deficit hyperactivity disorder)   °• Allergy   °• Asthma   °• Depressed   °• ETOH abuse   °• History of suicidal tendencies   ° ° °Social History  ° °Tobacco Use  °• Smoking status: Current Every Day Smoker  °  Packs/day: 1.00  °  Years: 10.00  °  Pack years: 10.00  °  Types: Cigarettes  °• Smokeless tobacco: Never Used  °Substance Use Topics  °• Alcohol use: Yes  °  Alcohol/week: 12.0 standard drinks  °  Types: 12 Cans of beer per week  °  Comment: 12 beers daily  °• Drug use: Yes  °  Frequency: 6.0 times per week  °  Types: Marijuana, Cocaine, Heroin, Fentanyl  °  Comment: daily use cocaine (snort)  ° ° °Family History  °Problem Relation Age of Onset  °• COPD Other   ° ° ° °Medications:  °Scheduled: °• chlordiazePOXIDE  10 mg Oral TID  °• Chlorhexidine Gluconate Cloth  6 each Topical Q0600  °• enoxaparin (LOVENOX) injection  40 mg Subcutaneous Q24H  °• folic acid  1 mg Oral Daily  °• mouth rinse  15 mL Mouth Rinse BID  °•   multivitamin with minerals  1 tablet Oral Daily  °• mupirocin ointment  1 application Nasal BID  °• nicotine  21 mg Transdermal Daily  °• thiamine  100 mg Oral Daily  ° Or  °• thiamine  100 mg Intravenous Daily  ° ° °Abtx:  °Anti-infectives (From admission, onward)  ° Start     Dose/Rate Route Frequency Ordered Stop  ° 12/28/18 1800  vancomycin (VANCOCIN) IVPB 1000 mg/200 mL premix    ° 1,000 mg °200 mL/hr over 60 Minutes Intravenous 2 times daily 12/28/18 0914    ° 12/28/18 1400  ceFEPIme (MAXIPIME) 2 g in sodium chloride 0.9 % 100 mL IVPB    ° 2 g °200 mL/hr over 30 Minutes Intravenous Every 8 hours 12/28/18 0914    ° 12/28/18 0730  vancomycin (VANCOCIN) 1,250 mg in sodium chloride 0.9 % 250 mL IVPB    ° 1,250 mg °166.7 mL/hr over 90 Minutes Intravenous STAT 12/28/18 0715 12/28/18 0935  ° 12/28/18 0730  ceFEPIme (MAXIPIME) 2 g in sodium chloride 0.9 % 100 mL IVPB    ° 2 g °200 mL/hr over 30 Minutes Intravenous STAT 12/28/18 0715 12/28/18 0833  °   ° ° ° ° °OBJECTIVE: °Blood pressure 121/70, pulse 92, temperature (!) 100.6 °F (38.1 °C), temperature source Bladder, resp. rate 17, height 6' (1.829 m), weight 54.3 kg, SpO2 100 %. ° °Physical Exam °Constitutional:   °   Appearance: He is ill-appearing.  °HENT:  °   Mouth/Throat:  °   Mouth: Mucous membranes are moist.  °Eyes:  °   Extraocular Movements: Extraocular movements intact.  °   Pupils: Pupils are equal, round, and reactive to light.  °Cardiovascular:  °   Rate and Rhythm: Normal rate and regular rhythm.  °Pulmonary:  °   Effort: Pulmonary effort is normal.  °   Breath sounds: Normal breath sounds.  °Abdominal:  °   General: Abdomen is flat. Bowel sounds are normal. There is no distension.  °   Palpations: Abdomen is soft.  °   Tenderness: There is no abdominal tenderness.  °Genitourinary: °   Penis: Normal and circumcised. No swelling or lesions.   °Musculoskeletal:  °     Arms: ° °   Right lower leg: No edema.  °   Left lower leg: No edema.  °Psychiatric:     °   Behavior: Behavior is agitated.  °   Comments: encephalopathic  ° ° ° °Lab Results °Results for orders placed or performed during the hospital encounter of 12/28/18 (from the past 48 hour(s))  °Urinalysis, Routine w reflex microscopic     Status: Abnormal  ° Collection Time: 12/28/18  5:36 AM  °Result Value Ref Range  ° Color, Urine YELLOW YELLOW  ° APPearance CLEAR CLEAR  ° Specific Gravity, Urine 1.010 1.005 - 1.030  ° pH 6.0 5.0 - 8.0  ° Glucose, UA NEGATIVE NEGATIVE mg/dL  ° Hgb urine dipstick SMALL (A) NEGATIVE  ° Bilirubin Urine NEGATIVE NEGATIVE  ° Ketones, ur NEGATIVE NEGATIVE mg/dL  ° Protein, ur NEGATIVE NEGATIVE mg/dL  ° Nitrite NEGATIVE NEGATIVE  ° Leukocytes,Ua NEGATIVE NEGATIVE  ° RBC / HPF 0-5 0 - 5 RBC/hpf  ° WBC, UA 0-5 0 - 5 WBC/hpf  ° Bacteria, UA NONE SEEN NONE SEEN  ° Mucus PRESENT   °  Comment: Performed at [facility], [address] , [place] [address]  °SARS CORONAVIRUS 2 (TAT 6-24 HRS)  Nasopharyngeal Nasopharyngeal Swab       Status: None  ° Collection Time: 12/28/18  5:37 AM  ° Specimen: Nasopharyngeal Swab  °Result Value Ref Range  ° SARS Coronavirus 2 NEGATIVE NEGATIVE  °  Comment: (NOTE) °SARS-CoV-2 target nucleic acids are NOT DETECTED. °The SARS-CoV-2 RNA is generally detectable in upper and lower °respiratory specimens during the acute phase of infection. Negative °results do not preclude SARS-CoV-2 infection, do not rule out °co-infections with other pathogens, and should not be used as the °sole basis for treatment or other patient management decisions. °Negative results must be combined with clinical observations, °patient history, and epidemiological information. The expected °result is Negative. °Fact Sheet for Patients: °https://www.fda.gov/media/138098/download °Fact Sheet for Healthcare Providers: °https://www.fda.gov/media/138095/download °This test is not yet approved or cleared by the [place] FDA and  °has been authorized for detection and/or diagnosis of SARS-CoV-2 by °FDA under an Emergency Use Authorization (EUA). This EUA will remain  °in effect (meaning this test can be used) for the duration of the °COVID-19 declaration under Section 56 °4(b)(1) of the Act, 21 U.S.C. °section 360bbb-3(b)(1), unless the authorization is terminated or °revoked sooner. °Performed at [facility], [address] [place], [place] °[address] °  °Rapid urine drug screen (hospital performed)     Status: Abnormal  ° Collection Time: 12/28/18  5:39 AM  °Result Value Ref Range  ° Opiates POSITIVE (A) NONE DETECTED  ° Cocaine NONE DETECTED NONE DETECTED  ° Benzodiazepines NONE DETECTED NONE DETECTED  ° Amphetamines POSITIVE (A) NONE DETECTED  ° Tetrahydrocannabinol NONE DETECTED NONE DETECTED  ° Barbiturates NONE DETECTED NONE DETECTED  °  Comment: (NOTE) °DRUG SCREEN FOR MEDICAL PURPOSES °ONLY.  IF CONFIRMATION IS NEEDED °FOR ANY PURPOSE, NOTIFY LAB °WITHIN 5 DAYS. °LOWEST DETECTABLE  LIMITS °FOR URINE DRUG SCREEN °Drug Class                     Cutoff (ng/mL) °Amphetamine and metabolites    1000 °Barbiturate and metabolites    200 °Benzodiazepine                 200 °Tricyclics and metabolites     300 °Opiates and metabolites        300 °Cocaine and metabolites        300 °THC                            50 °Performed at [facility], [address] °[place],  [address] °  °CBC with Differential     Status: Abnormal  ° Collection Time: 12/28/18  6:30 AM  °Result Value Ref Range  ° WBC 13.3 (H) 4.0 - 10.5 K/uL  ° RBC 4.01 (L) 4.22 - 5.81 MIL/uL  ° Hemoglobin 11.8 (L) 13.0 - 17.0 g/dL  ° HCT 35.7 (L) 39.0 - 52.0 %  ° MCV 89.0 80.0 - 100.0 fL  ° MCH 29.4 26.0 - 34.0 pg  ° MCHC 33.1 30.0 - 36.0 g/dL  ° RDW 12.5 11.5 - 15.5 %  ° Platelets 208 150 - 400 K/uL  ° nRBC 0.0 0.0 - 0.2 %  ° Neutrophils Relative % 84 %  ° Neutro Abs 11.1 (H) 1.7 - 7.7 K/uL  ° Lymphocytes Relative 8 %  ° Lymphs Abs 1.0 0.7 - 4.0 K/uL  ° Monocytes Relative 8 %  ° Monocytes Absolute 1.1 (H) 0.1 - 1.0 K/uL  ° Eosinophils Relative 0 %  ° Eosinophils Absolute 0.0 0.0 - 0.5   K/uL  ° Basophils Relative 0 %  ° Basophils Absolute 0.1 0.0 - 0.1 K/uL  ° Immature Granulocytes 0 %  ° Abs Immature Granulocytes 0.06 0.00 - 0.07 K/uL  °  Comment: Performed at [facility], [address] [place], [place] [address]  °Comprehensive metabolic panel     Status: Abnormal  ° Collection Time: 12/28/18  6:30 AM  °Result Value Ref Range  ° Sodium 130 (L) 135 - 145 mmol/L  ° Potassium 3.9 3.5 - 5.1 mmol/L  ° Chloride 95 (L) 98 - 111 mmol/L  ° CO2 22 22 - 32 mmol/L  ° Glucose, Bld 110 (H) 70 - 99 mg/dL  ° BUN 8 6 - 20 mg/dL  ° Creatinine, Ser 0.70 0.61 - 1.24 mg/dL  ° Calcium 8.7 (L) 8.9 - 10.3 mg/dL  ° Total Protein 7.0 6.5 - 8.1 g/dL  ° Albumin 2.9 (L) 3.5 - 5.0 g/dL  ° AST 57 (H) 15 - 41 U/L  ° ALT 57 (H) 0 - 44 U/L  ° Alkaline Phosphatase 231 (H) 38 - 126 U/L  ° Total Bilirubin 0.8 0.3 - 1.2 mg/dL  ° GFR  calc non Af Amer >60 >60 mL/min  ° GFR calc Af Amer >60 >60 mL/min  ° Anion gap 13 5 - 15  °  Comment: Performed at [facility], [address] [place], [place] [address]  °Ethanol     Status: None  ° Collection Time: 12/28/18  6:30 AM  °Result Value Ref Range  ° Alcohol, Ethyl (B) <10 <10 mg/dL  °  Comment: (NOTE) °Lowest detectable limit for serum alcohol is 10 mg/dL. °For medical purposes only. °Performed at [facility], [address] °[place], [place] [address] °  °CK     Status: None  ° Collection Time: 12/28/18  6:30 AM  °Result Value Ref Range  ° Total CK 52 49 - 397 U/L  °  Comment: Performed at [facility], [address] [place], [place] [address]  °Blood culture (routine x 2)     Status: Abnormal (Preliminary result)  ° Collection Time: 12/28/18  7:47 AM  ° Specimen: BLOOD RIGHT ARM  °Result Value Ref Range  ° Specimen Description    °  BLOOD RIGHT ARM °Performed at [facility], [address] [place], [place] [address] °  ° Special Requests    °  BOTTLES DRAWN AEROBIC AND ANAEROBIC Blood Culture results may not be optimal due to an excessive volume of blood received in culture bottles °Performed at [facility], [address] [place], [place] [address] °  ° Culture  Setup Time    °  GRAM POSITIVE COCCI °IN BOTH AEROBIC AND ANAEROBIC BOTTLES °CRITICAL RESULT CALLED TO, READ BACK BY AND VERIFIED WITH: PHARMD JUSTIN L 1948 [id number] FCP °CRITICAL RESULT CALLED TO, READ BACK BY AND VERIFIED WITH: B GREEN,PHARMD AT 2206 12/28/2018 BY L BENFIELD °Performed at [facility], [address] [place], [place] [address] °  ° Culture STAPHYLOCOCCUS AUREUS (A)   ° Report Status PENDING   °Blood culture (routine x 2)     Status: Abnormal (Preliminary result)  ° Collection Time: 12/28/18  7:47 AM  ° Specimen: BLOOD  °Result Value Ref Range  ° Specimen Description    °  BLOOD RIGHT WRIST °Performed at [facility], [address] [place], [place] [address] °  ° Special Requests    °    BOTTLES DRAWN AEROBIC AND ANAEROBIC Blood Culture results may not be optimal due to an inadequate volume of blood received in culture bottles °Performed at [facility], [address] [place], [place] [address] °  ° Culture  Setup Time    °  GRAM POSITIVE COCCI IN CLUSTERS °IN BOTH AEROBIC AND ANAEROBIC BOTTLES °CRITICAL VALUE NOTED.  VALUE IS CONSISTENT WITH PREVIOUSLY REPORTED AND CALLED VALUE. °Performed at [facility], [address] [place], [place] [address] °  ° Culture STAPHYLOCOCCUS AUREUS (A)   ° Report Status PENDING   °Lactic acid, plasma     Status: None  ° Collection Time: 12/28/18  7:47 AM  °Result Value Ref Range  ° Lactic Acid, Venous 1.0 0.5 - 1.9 mmol/L  °  Comment: Performed at [facility], [address] [place], [place] [address]  °APTT     Status: Abnormal  ° Collection Time: 12/28/18  7:47 AM  °Result Value Ref Range  ° aPTT 38 (H) 24 - 36 seconds  °  Comment:        °IF BASELINE aPTT IS ELEVATED, °SUGGEST PATIENT RISK ASSESSMENT °BE USED TO DETERMINE APPROPRIATE °ANTICOAGULANT THERAPY. °Performed at [facility], [address] [place], [place] [address] °  °Protime-INR     Status: Abnormal  ° Collection Time: 12/28/18  7:47 AM  °Result Value Ref Range  ° Prothrombin Time 15.5 (H) 11.4 - 15.2 seconds  ° INR 1.2 0.8 - 1.2  °  Comment: (NOTE) °INR goal varies based on device and disease states. °Performed at [facility], [address] °[place], [place] [address] °  °Urine culture     Status: Abnormal (Preliminary result)  ° Collection Time: 12/28/18  7:47 AM  ° Specimen: In/Out Cath Urine  °Result Value Ref Range  ° Specimen Description    °  IN/OUT CATH URINE °Performed at [facility], [address] [place], [place] [address] °  ° Special Requests    °  NONE °Performed at [facility], [address] [place], [place] [address] °  ° Culture (A)   °  30,000 COLONIES/mL STAPHYLOCOCCUS AUREUS °SUSCEPTIBILITIES TO FOLLOW °Performed at [facility], [address] [place], [place] [address] °  ° Report Status PENDING   °Blood Culture ID Panel (Reflexed)     Status: Abnormal  ° Collection Time: 12/28/18  7:47 AM  °Result Value Ref Range  ° Enterococcus species NOT DETECTED NOT DETECTED  ° Listeria monocytogenes NOT DETECTED NOT DETECTED  ° Staphylococcus species DETECTED (A) NOT DETECTED  °  Comment: CRITICAL RESULT CALLED TO, READ BACK BY AND VERIFIED WITH: °B GREEN,PHARMD AT 2206 12/28/2018 BY L BENFIELD °  ° Staphylococcus aureus (BCID) DETECTED (A) NOT DETECTED  °  Comment: Methicillin (oxacillin)-resistant Staphylococcus aureus (MRSA). MRSA is predictably resistant to beta-lactam antibiotics (except ceftaroline). Preferred therapy is vancomycin unless clinically contraindicated. Patient requires contact precautions if  °hospitalized. °CRITICAL RESULT CALLED TO, READ BACK BY AND VERIFIED WITH: °B GREEN,PHARMD AT 2206 12/28/2018 BY L BENFIELD °  ° Methicillin resistance DETECTED (A) NOT DETECTED  °  Comment: CRITICAL RESULT CALLED TO, READ BACK BY AND VERIFIED WITH: °B GREEN,PHARMD AT 2206 12/28/2018 BY L BENFIELD °  ° Streptococcus species NOT DETECTED NOT DETECTED  ° Streptococcus agalactiae NOT DETECTED NOT DETECTED  ° Streptococcus pneumoniae NOT DETECTED NOT DETECTED  ° Streptococcus pyogenes NOT DETECTED NOT DETECTED  ° Acinetobacter   baumannii NOT DETECTED NOT DETECTED  ° Enterobacteriaceae species NOT DETECTED NOT DETECTED  ° Enterobacter cloacae complex NOT DETECTED NOT DETECTED  ° Escherichia coli NOT DETECTED NOT DETECTED  ° Klebsiella oxytoca NOT DETECTED NOT DETECTED  ° Klebsiella pneumoniae NOT DETECTED NOT DETECTED  ° Proteus species NOT DETECTED NOT DETECTED  ° Serratia marcescens NOT DETECTED NOT DETECTED  ° Haemophilus influenzae NOT DETECTED NOT DETECTED  °  Neisseria meningitidis NOT DETECTED NOT DETECTED  ° Pseudomonas aeruginosa NOT DETECTED NOT DETECTED  ° Candida albicans NOT DETECTED NOT DETECTED  ° Candida glabrata NOT DETECTED NOT DETECTED  ° Candida krusei NOT DETECTED NOT DETECTED  ° Candida parapsilosis NOT DETECTED NOT DETECTED  ° Candida tropicalis NOT DETECTED NOT DETECTED  °  Comment: Performed at [facility], [address] [place], [place] [address]  °HIV Antibody (routine testing w rflx)     Status: None  ° Collection Time: 12/28/18 10:25 AM  °Result Value Ref Range  ° HIV Screen 4th Generation wRfx NON REACTIVE NON REACTIVE  °  Comment: Performed at [facility], [address] [place], [place] [address]  °C-reactive protein     Status: Abnormal  ° Collection Time: 12/28/18 10:25 AM  °Result Value Ref Range  ° CRP 19.6 (H) <1.0 mg/dL  °  Comment: Performed at [facility], [address] [place], [place] [address]  °Sedimentation rate     Status: Abnormal  ° Collection Time: 12/28/18 10:25 AM  °Result Value Ref Range  ° Sed Rate 105 (H) 0 - 16 mm/hr  °  Comment: Performed at [facility], [address] [place], [place] [address]  °Hepatitis panel, acute     Status: None  ° Collection Time: 12/28/18 10:25 AM  °Result Value Ref Range  ° Hepatitis B Surface Ag NON REACTIVE NON REACTIVE  ° HCV Ab NON REACTIVE NON REACTIVE  °  Comment: (NOTE) °Nonreactive HCV antibody screen is consistent with no HCV infections,  °unless recent infection is suspected or other evidence exists to °indicate HCV infection. °  ° Hep A IgM NON REACTIVE NON REACTIVE  ° Hep B C IgM NON REACTIVE NON REACTIVE  °  Comment: Performed at  [facility]ospital Lab, [address] [place], [place] [address]  °Lactic acid, plasma     Status: None  ° Collection Time: 12/28/18 10:26 AM  °Result Value Ref Range  ° Lactic Acid, Venous 1.3 0.5 - 1.9 mmol/L  °  Comment: Performed at [facility], [address]  [place], [place] [address]  °MRSA PCR Screening     Status: Abnormal  ° Collection Time: 12/28/18 12:51 PM  ° Specimen: Nasal Mucosa; Nasopharyngeal  °Result Value Ref Range  ° MRSA by PCR POSITIVE (A) NEGATIVE  °  Comment:        °The GeneXpert MRSA Assay (FDA °approved for NASAL specimens °only), is one component of a °comprehensive MRSA colonization °surveillance program. It is not °intended to diagnose MRSA °infection nor to guide or °monitor treatment for °MRSA infections. °RESULT CALLED TO, READ BACK BY AND VERIFIED WITH: °K ZULETA,RN 12/28/18 1507 RHOLMES °Performed at [facility], [address] [place], [place] [address] °  °Respiratory Panel by PCR     Status: None  ° Collection Time: 12/28/18  1:59 PM  ° Specimen: Nasopharyngeal Swab; Respiratory  °Result Value Ref Range  ° Adenovirus NOT DETECTED NOT DETECTED  ° Coronavirus 229E NOT   DETECTED NOT DETECTED  °  Comment: (NOTE) °The Coronavirus on the Respiratory Panel, DOES NOT test for the novel  °Coronavirus (2019 nCoV) °  ° Coronavirus HKU1 NOT DETECTED NOT DETECTED  ° Coronavirus NL63 NOT DETECTED NOT DETECTED  ° Coronavirus OC43 NOT DETECTED NOT DETECTED  ° Metapneumovirus NOT DETECTED NOT DETECTED  ° Rhinovirus / Enterovirus NOT DETECTED NOT DETECTED  ° Influenza A NOT DETECTED NOT DETECTED  ° Influenza B NOT DETECTED NOT DETECTED  ° Parainfluenza Virus 1 NOT DETECTED NOT DETECTED  ° Parainfluenza Virus 2 NOT DETECTED NOT DETECTED  ° Parainfluenza Virus 3 NOT DETECTED NOT DETECTED  ° Parainfluenza Virus 4 NOT DETECTED NOT DETECTED  ° Respiratory Syncytial Virus NOT DETECTED NOT DETECTED  ° Bordetella pertussis NOT DETECTED NOT DETECTED  ° Chlamydophila pneumoniae NOT DETECTED NOT DETECTED  ° Mycoplasma pneumoniae NOT DETECTED NOT DETECTED  °  Comment: Performed at [facility], [address] [place], [place] [address]  °SARS Coronavirus 2 by RT PCR (hospital order, performed in [place] hospital lab) Nasopharyngeal  Nasopharyngeal Swab     Status: None  ° Collection Time: 12/28/18  1:59 PM  ° Specimen: Nasopharyngeal Swab  °Result Value Ref Range  ° SARS Coronavirus 2 NEGATIVE NEGATIVE  °  Comment: (NOTE) °If result is NEGATIVE °SARS-CoV-2 target nucleic acids are NOT DETECTED. °The SARS-CoV-2 RNA is generally detectable in upper and lower  °respiratory specimens during the acute phase of infection. The lowest  °concentration of SARS-CoV-2 viral copies this assay can detect is 250  °copies / mL. A negative result does not preclude SARS-CoV-2 infection  °and should not be used as the sole basis for treatment or other  °patient management decisions.  A negative result may occur with  °improper specimen collection / handling, submission of specimen other  °than nasopharyngeal swab, presence of viral mutation(s) within the  °areas targeted by this assay, and inadequate number of viral copies  °(<250 copies / mL). A negative result must be combined with clinical  °observations, patient history, and epidemiological information. °If result is POSITIVE °SARS-CoV-2 target nucleic acids are DETECTED. °The SARS-CoV-2 RNA is generally detectable in upper and lower  °respiratory specimens dur °ing the acute phase of infection.  Positive  °results are indicative of active infection with SARS-CoV-2.  Clinical  °correlation with patient history and other diagnostic information is  °necessary to determine patient infection status.  Positive results do  °not rule out bacterial infection or co-infection with other viruses. °If result is PRESUMPTIVE POSTIVE °SARS-CoV-2 nucleic acids MAY BE PRESENT.   °A presumptive positive result was obtained on the submitted specimen  °and confirmed on repeat testing.  While 2019 novel coronavirus  °(SARS-CoV-2) nucleic acids may be present in the submitted sample  °additional confirmatory testing may be necessary for epidemiological  °and / or clinical management purposes  to differentiate between  °SARS-CoV-2  and other Sarbecovirus currently known to infect humans.  °If clinically indicated additional testing with an alternate test  °methodology ([id number]) is advised. The SARS-CoV-2 RNA is generally  °detectable in upper and lower respiratory sp °ecimens during the acute  °phase of infection. °The expected result is Negative. °Fact Sheet for Patients:  https://www.fda.gov/media/136312/download °Fact Sheet for Healthcare Providers: °https://www.fda.gov/media/136313/download °This test is not yet approved or cleared by the [place] FDA and °has been authorized for detection and/or diagnosis of SARS-CoV-2 by °FDA under an Emergency Use Authorization (EUA).  This EUA will remain °in effect (meaning this test   can be used) for the duration of the °COVID-19 declaration under Section 564(b)(1) of the Act, 21 U.S.C. °section 360bbb-3(b)(1), unless the authorization is terminated or °revoked sooner. °Performed at [facility], [address] °[place], [place] [address] °  °CBC     Status: Abnormal  ° Collection Time: 12/29/18  5:33 AM  °Result Value Ref Range  ° WBC 14.8 (H) 4.0 - 10.5 K/uL  ° RBC 4.15 (L) 4.22 - 5.81 MIL/uL  ° Hemoglobin 12.2 (L) 13.0 - 17.0 g/dL  ° HCT 35.5 (L) 39.0 - 52.0 %  ° MCV 85.5 80.0 - 100.0 fL  ° MCH 29.4 26.0 - 34.0 pg  ° MCHC 34.4 30.0 - 36.0 g/dL  ° RDW 12.4 11.5 - 15.5 %  ° Platelets 178 150 - 400 K/uL  ° nRBC 0.0 0.0 - 0.2 %  °  Comment: Performed at [facility], [address] [place], [place] [address]  °Comprehensive metabolic panel     Status: Abnormal  ° Collection Time: 12/29/18  6:09 AM  °Result Value Ref Range  ° Sodium 139 135 - 145 mmol/L  °  Comment: DELTA CHECK NOTED °REPEATED TO VERIFY °  ° Potassium 3.7 3.5 - 5.1 mmol/L  ° Chloride 110 98 - 111 mmol/L  ° CO2 18 (L) 22 - 32 mmol/L  ° Glucose, Bld 89 70 - 99 mg/dL  ° BUN 9 6 - 20 mg/dL  ° Creatinine, Ser 0.62 0.61 - 1.24 mg/dL  ° Calcium 8.4 (L) 8.9 - 10.3 mg/dL  ° Total Protein 6.0 (L) 6.5 -  8.1 g/dL  ° Albumin 2.5 (L) 3.5 - 5.0 g/dL  ° AST 26 15 - 41 U/L  ° ALT 36 0 - 44 U/L  ° Alkaline Phosphatase 166 (H) 38 - 126 U/L  ° Total Bilirubin 0.7 0.3 - 1.2 mg/dL  ° GFR calc non Af Amer >60 >60 mL/min  ° GFR calc Af Amer >60 >60 mL/min  ° Anion gap 11 5 - 15  °  Comment: Performed at [facility], [address] [place], [place] [address]  °Magnesium     Status: None  ° Collection Time: 12/29/18  9:20 AM  °Result Value Ref Range  ° Magnesium 1.9 1.7 - 2.4 mg/dL  °  Comment: Performed at [facility], [address] [address] [place], [place] [address]  °Phosphorus     Status: None  ° Collection Time: 12/29/18  9:20 AM  °Result Value Ref Range  ° Phosphorus 3.9 2.5 - 4.6 mg/dL  °  Comment: Performed at [facility], [address] [place], [place] [address]  °Glucose, capillary     Status: None  ° Collection Time: 12/29/18 11:53 AM  °Result Value Ref Range  ° Glucose-Capillary 89 70 - 99 mg/dL  ° °   °Component Value Date/Time  ° SDES  12/28/2018 0747  °  BLOOD RIGHT ARM °Performed at [facility], [address] [place], [place] [address] °  ° SDES  12/28/2018 0747  °  BLOOD RIGHT WRIST °Performed at [facility], [address] [place], [place] [address] °  ° SDES  12/28/2018 0747  °  IN/OUT CATH URINE °Performed at [facility], [address] [place], [place] [address] °  ° SPECREQUEST  12/28/2018 0747  °  BOTTLES DRAWN AEROBIC AND ANAEROBIC Blood Culture results may not be optimal due to an excessive volume of blood   received in culture bottles °Performed at [facility], [address] [place], [place] [address] °  ° SPECREQUEST  12/28/2018 0747  °  BOTTLES DRAWN AEROBIC AND ANAEROBIC Blood Culture results may not be optimal due to an inadequate volume of blood received in culture bottles °Performed at [facility], [address] [place], [place] [address] °  °  SPECREQUEST  12/28/2018 0747  °  NONE °[facility] [address] [place], [place] [address] °  ° CULT STAPHYLOCOCCUS AUREUS (A) 12/28/2018 0747  ° CULT STAPHYLOCOCCUS AUREUS (A) 12/28/2018 0747  ° CULT (A) 12/28/2018 0747  °  30,000 COLONIES/mL STAPHYLOCOCCUS AUREUS °SUSCEPTIBILITIES TO FOLLOW °Performed at [facility], [address] [place], [place] [address] °  ° REPTSTATUS PENDING 12/28/2018 0747  ° REPTSTATUS PENDING 12/28/2018 0747  ° REPTSTATUS PENDING 12/28/2018 0747  ° °Dg Chest 2 View ° °Result Date: 12/28/2018 °CLINICAL DATA:  Generalized body aches.  Drug abuse. EXAM: CHEST - 2 VIEW COMPARISON:  11/21/2018; 04/26/2014 FINDINGS: Grossly unchanged cardiac silhouette and mediastinal contours. The lungs appear hyperexpanded with flattening of the diaphragms mild diffuse slightly nodular thickening of the pulmonary interstitium. No discrete focal airspace opacities. No pleural effusion or pneumothorax. No evidence of edema. No acute osseous abnormalities. IMPRESSION: Similar findings of lung hyperexpansion and bronchitic change without superimposed acute cardiopulmonary disease. Electronically Signed   By: John  Watts M.D.   On: 12/28/2018 06:11  ° °Us Abdomen Limited Ruq ° °Result Date: 12/28/2018 °CLINICAL DATA:  Elevated liver enzymes. EXAM: ULTRASOUND ABDOMEN LIMITED RIGHT UPPER QUADRANT COMPARISON:  None. FINDINGS: Gallbladder: No gallstones or wall thickening visualized. No sonographic Murphy sign noted by sonographer. Common bile duct: Diameter: 4.2 mm Liver: No focal lesion identified. Within normal limits in parenchymal echogenicity. Portal vein is patent on color Doppler imaging with normal direction of blood flow towards the liver. Other: None. IMPRESSION: Normal study. Electronically Signed   By: David  Williams III M.D   On: 12/28/2018 17:09  ° °Recent Results (from the past 240 hour(s))  °SARS CORONAVIRUS 2 (TAT 6-24 HRS) Nasopharyngeal Nasopharyngeal  Swab     Status: None  ° Collection Time: 12/28/18  5:37 AM  ° Specimen: Nasopharyngeal Swab  °Result Value Ref Range Status  ° SARS Coronavirus 2 NEGATIVE NEGATIVE Final  °  Comment: (NOTE) °SARS-CoV-2 target nucleic acids are NOT DETECTED. °The SARS-CoV-2 RNA is generally detectable in upper and lower °respiratory specimens during the acute phase of infection. Negative °results do not preclude SARS-CoV-2 infection, do not rule out °co-infections with other pathogens, and should not be used as the °sole basis for treatment or other patient management decisions. °Negative results must be combined with clinical observations, °patient history, and epidemiological information. The expected °result is Negative. °Fact Sheet for Patients: °https://www.fda.gov/media/138098/download °Fact Sheet for Healthcare Providers: °https://www.fda.gov/media/138095/download °This test is not yet approved or cleared by the [place] FDA and  °has been authorized for detection and/or diagnosis of SARS-CoV-2 by °FDA under an Emergency Use Authorization (EUA). This EUA will remain  °in effect (meaning this test can be used) for the duration of the °COVID-19 declaration under Section 56 °4(b)(1) of the Act, 21 U.S.C. °section 360bbb-3(b)(1), unless the authorization is terminated or °revoked sooner. °Performed at [facility], [address] [place], [place] °[address] °  °Blood culture (routine x 2)     Status: Abnormal (Preliminary result)  ° Collection Time: 12/28/18  7:47 AM  °   Specimen: BLOOD RIGHT ARM  °Result Value Ref Range Status  ° Specimen Description   Final  °  BLOOD RIGHT ARM °Performed at [facility], [address] [place], [place] [address] °  ° Special Requests   Final  °  BOTTLES DRAWN AEROBIC AND ANAEROBIC Blood Culture results may not be optimal due to an excessive volume of blood received in culture bottles °Performed at [facility], [address] [place], [place] [address] °  °  Culture  Setup Time   Final  °  GRAM POSITIVE COCCI °IN BOTH AEROBIC AND ANAEROBIC BOTTLES °CRITICAL RESULT CALLED TO, READ BACK BY AND VERIFIED WITH: PHARMD JUSTIN L 1948 [id number] FCP °CRITICAL RESULT CALLED TO, READ BACK BY AND VERIFIED WITH: B GREEN,PHARMD AT 2206 12/28/2018 BY L BENFIELD °Performed at [facility], [address] [place], [place] [address] °  ° Culture STAPHYLOCOCCUS AUREUS (A)  Final  ° Report Status PENDING  Incomplete  °Blood culture (routine x 2)     Status: Abnormal (Preliminary result)  ° Collection Time: 12/28/18  7:47 AM  ° Specimen: BLOOD  °Result Value Ref Range Status  ° Specimen Description   Final  °  BLOOD RIGHT WRIST °Performed at [facility], [address] [place], [place] [address] °  ° Special Requests   Final  °  BOTTLES DRAWN AEROBIC AND ANAEROBIC Blood Culture results may not be optimal due to an inadequate volume of blood received in culture bottles °Performed at [facility], [address] [place], [place] [address] °  ° Culture  Setup Time   Final  °  GRAM POSITIVE COCCI IN CLUSTERS °IN BOTH AEROBIC AND ANAEROBIC BOTTLES °CRITICAL VALUE NOTED.  VALUE IS CONSISTENT WITH PREVIOUSLY REPORTED AND CALLED VALUE. °Performed at [facility], [address] [place], [place] [address] °  ° Culture STAPHYLOCOCCUS AUREUS (A)  Final  ° Report Status PENDING  Incomplete  °Urine culture     Status: Abnormal (Preliminary result)  ° Collection Time: 12/28/18  7:47 AM  ° Specimen: In/Out Cath Urine  °Result Value Ref Range Status  ° Specimen Description   Final  °  IN/OUT CATH URINE °Performed at [facility], [address] [place], [place] [address] °  ° Special Requests   Final  °  NONE °[facility] at  [facility], [address] [place], [place] [address] °  ° Culture (A)  Final  °  30,000 COLONIES/mL STAPHYLOCOCCUS AUREUS °SUSCEPTIBILITIES TO FOLLOW °Performed at [facility], [address] [place],  [address] °  ° Report Status PENDING  Incomplete  °Blood Culture ID Panel (Reflexed)     Status: Abnormal  ° Collection Time: 12/28/18  7:47 AM  °Result Value Ref Range Status  ° Enterococcus species NOT DETECTED NOT DETECTED Final  ° Listeria monocytogenes NOT DETECTED NOT DETECTED Final  ° Staphylococcus species DETECTED (A) NOT DETECTED Final  °  Comment: CRITICAL RESULT CALLED TO, READ BACK BY AND VERIFIED WITH: °B GREEN,PHARMD AT 2206 12/28/2018 BY L BENFIELD °  ° Staphylococcus aureus (BCID) DETECTED (A) NOT DETECTED Final  °  Comment: Methicillin (oxacillin)-resistant Staphylococcus aureus (MRSA). MRSA is predictably resistant to beta-lactam antibiotics (except ceftaroline). Preferred therapy is vancomycin unless clinically contraindicated. Patient requires contact precautions if  °hospitalized. °CRITICAL RESULT CALLED TO, READ BACK BY AND VERIFIED WITH: °B GREEN,PHARMD AT 2206 12/28/2018 BY L BENFIELD °  °   Methicillin resistance DETECTED (A) NOT DETECTED Final  °  Comment: CRITICAL RESULT CALLED TO, READ BACK BY AND VERIFIED WITH: °B GREEN,PHARMD AT 2206 12/28/2018 BY L BENFIELD °  ° Streptococcus species NOT DETECTED NOT DETECTED Final  ° Streptococcus agalactiae NOT DETECTED NOT DETECTED Final  ° Streptococcus pneumoniae NOT DETECTED NOT DETECTED Final  ° Streptococcus pyogenes NOT DETECTED NOT DETECTED Final  ° Acinetobacter baumannii NOT DETECTED NOT DETECTED Final  ° Enterobacteriaceae species NOT DETECTED NOT DETECTED Final  ° Enterobacter cloacae complex NOT DETECTED NOT DETECTED Final  ° Escherichia coli NOT DETECTED NOT DETECTED Final  ° Klebsiella oxytoca NOT DETECTED NOT DETECTED Final  ° Klebsiella pneumoniae NOT DETECTED NOT DETECTED Final  ° Proteus species NOT DETECTED NOT DETECTED Final  ° Serratia marcescens NOT DETECTED NOT DETECTED Final  ° Haemophilus influenzae NOT DETECTED NOT DETECTED Final  ° Neisseria meningitidis NOT DETECTED NOT DETECTED Final  ° Pseudomonas  aeruginosa NOT DETECTED NOT DETECTED Final  ° Candida albicans NOT DETECTED NOT DETECTED Final  ° Candida glabrata NOT DETECTED NOT DETECTED Final  ° Candida krusei NOT DETECTED NOT DETECTED Final  ° Candida parapsilosis NOT DETECTED NOT DETECTED Final  ° Candida tropicalis NOT DETECTED NOT DETECTED Final  °  Comment: Performed at [facility], [address] [place], [place] [address]  °MRSA PCR Screening     Status: Abnormal  ° Collection Time: 12/28/18 12:51 PM  ° Specimen: Nasal Mucosa; Nasopharyngeal  °Result Value Ref Range Status  ° MRSA by PCR POSITIVE (A) NEGATIVE Final  °  Comment:        °The GeneXpert MRSA Assay (FDA °approved for NASAL specimens °only), is one component of a °comprehensive MRSA colonization °surveillance program. It is not °intended to diagnose MRSA °infection nor to guide or °monitor treatment for °MRSA infections. °RESULT CALLED TO, READ BACK BY AND VERIFIED WITH: °K ZULETA,RN 12/28/18 1507 RHOLMES °Performed at [facility], [address] [place], [place] [address] °  °Respiratory Panel by PCR     Status: None  ° Collection Time: 12/28/18  1:59 PM  ° Specimen: Nasopharyngeal Swab; Respiratory  °Result Value Ref Range Status  ° Adenovirus NOT DETECTED NOT DETECTED Final  ° Coronavirus 229E NOT DETECTED NOT DETECTED Final  °  Comment: (NOTE) °The Coronavirus on the Respiratory Panel, DOES NOT test for the novel  °Coronavirus (2019 nCoV) °  ° Coronavirus HKU1 NOT DETECTED NOT DETECTED Final  ° Coronavirus NL63 NOT DETECTED NOT DETECTED Final  ° Coronavirus OC43 NOT DETECTED NOT DETECTED Final  ° Metapneumovirus NOT DETECTED NOT DETECTED Final  ° Rhinovirus / Enterovirus NOT DETECTED NOT DETECTED Final  ° Influenza A NOT DETECTED NOT DETECTED Final  ° Influenza B NOT DETECTED NOT DETECTED Final  ° Parainfluenza Virus 1 NOT DETECTED NOT DETECTED Final  ° Parainfluenza Virus 2 NOT DETECTED NOT DETECTED Final  ° Parainfluenza Virus 3 NOT DETECTED NOT DETECTED  Final  ° Parainfluenza Virus 4 NOT DETECTED NOT DETECTED Final  ° Respiratory Syncytial Virus NOT DETECTED NOT DETECTED Final  ° Bordetella pertussis NOT DETECTED NOT DETECTED Final  ° Chlamydophila pneumoniae NOT DETECTED NOT DETECTED Final  ° Mycoplasma pneumoniae NOT DETECTED NOT DETECTED Final  °  Comment: Performed at [facility], [address] [place], [place] [address]  °SARS Coronavirus 2 by RT PCR (hospital order, performed in [facility]) Nasopharyngeal Nasopharyngeal Swab     Status: None  ° Collection Time: 12/28/18    1:59 PM  ° Specimen: Nasopharyngeal Swab  °Result Value Ref Range Status  ° SARS Coronavirus 2 NEGATIVE NEGATIVE Final  °  Comment: (NOTE) °If result is NEGATIVE °SARS-CoV-2 target nucleic acids are NOT DETECTED. °The SARS-CoV-2 RNA is generally detectable in upper and lower  °respiratory specimens during the acute phase of infection. The lowest  °concentration of SARS-CoV-2 viral copies this assay can detect is 250  °copies / mL. A negative result does not preclude SARS-CoV-2 infection  °and should not be used as the sole basis for treatment or other  °patient management decisions.  A negative result may occur with  °improper specimen collection / handling, submission of specimen other  °than nasopharyngeal swab, presence of viral mutation(s) within the  °areas targeted by this assay, and inadequate number of viral copies  °(<250 copies / mL). A negative result must be combined with clinical  °observations, patient history, and epidemiological information. °If result is POSITIVE °SARS-CoV-2 target nucleic acids are DETECTED. °The SARS-CoV-2 RNA is generally detectable in upper and lower  °respiratory specimens dur °ing the acute phase of infection.  Positive  °results are indicative of active infection with SARS-CoV-2.  Clinical  °correlation with patient history and other diagnostic information is  °necessary to determine patient infection status.  Positive results do   °not rule out bacterial infection or co-infection with other viruses. °If result is PRESUMPTIVE POSTIVE °SARS-CoV-2 nucleic acids MAY BE PRESENT.   °A presumptive positive result was obtained on the submitted specimen  °and confirmed on repeat testing.  While 2019 novel coronavirus  °(SARS-CoV-2) nucleic acids may be present in the submitted sample  °additional confirmatory testing may be necessary for epidemiological  °and / or clinical management purposes  to differentiate between  °SARS-CoV-2 and other Sarbecovirus currently known to infect humans.  °If clinically indicated additional testing with an alternate test  °methodology ([id number]) is advised. The SARS-CoV-2 RNA is generally  °detectable in upper and lower respiratory sp °ecimens during the acute  °phase of infection. °The expected result is Negative. °Fact Sheet for Patients:  https://www.fda.gov/media/136312/download °Fact Sheet for Healthcare Providers: °https://www.fda.gov/media/136313/download °This test is not yet approved or cleared by the [place] FDA and °has been authorized for detection and/or diagnosis of SARS-CoV-2 by °FDA under an Emergency Use Authorization (EUA).  This EUA will remain °in effect (meaning this test can be used) for the duration of the °COVID-19 declaration under Section 564(b)(1) of the Act, 21 U.S.C. °section 360bbb-3(b)(1), unless the authorization is terminated or °revoked sooner. °Performed at [facility], [address] °[place], [place] [address] °  ° ° °Microbiology: °Recent Results (from the past 240 hour(s))  °SARS CORONAVIRUS 2 (TAT 6-24 HRS) Nasopharyngeal Nasopharyngeal Swab     Status: None  ° Collection Time: 12/28/18  5:37 AM  ° Specimen: Nasopharyngeal Swab  °Result Value Ref Range Status  ° SARS Coronavirus 2 NEGATIVE NEGATIVE Final  °  Comment: (NOTE) °SARS-CoV-2 target nucleic acids are NOT DETECTED. °The SARS-CoV-2 RNA is generally detectable in upper and lower °respiratory  specimens during the acute phase of infection. Negative °results do not preclude SARS-CoV-2 infection, do not rule out °co-infections with other pathogens, and should not be used as the °sole basis for treatment or other patient management decisions. °Negative results must be combined with clinical observations, °patient history, and epidemiological information. The expected °result is Negative. °Fact Sheet for Patients: °https://www.fda.gov/media/138098/download °Fact Sheet for Healthcare Providers: °https://www.fda.gov/media/138095/download °This test is not yet approved or cleared by   the [place] FDA and  °has been authorized for detection and/or diagnosis of SARS-CoV-2 by °FDA under an Emergency Use Authorization (EUA). This EUA will remain  °in effect (meaning this test can be used) for the duration of the °COVID-19 declaration under Section 56 °4(b)(1) of the Act, 21 U.S.C. °section 360bbb-3(b)(1), unless the authorization is terminated or °revoked sooner. °Performed at [facility], [address] [place], [place] °[address] °  °Blood culture (routine x 2)     Status: Abnormal (Preliminary result)  ° Collection Time: 12/28/18  7:47 AM  ° Specimen: BLOOD RIGHT ARM  °Result Value Ref Range Status  ° Specimen Description   Final  °  BLOOD RIGHT ARM °Performed at [facility], [address] [place], [place] [address] °  ° Special Requests   Final  °  BOTTLES DRAWN AEROBIC AND ANAEROBIC Blood Culture results may not be optimal due to an excessive volume of blood received in culture bottles °Performed at [facility], [address] [place], [place] [address] °  ° Culture  Setup Time   Final  °  GRAM POSITIVE COCCI °IN BOTH AEROBIC AND ANAEROBIC BOTTLES °CRITICAL RESULT CALLED TO, READ BACK BY AND VERIFIED WITH: PHARMD JUSTIN L 1948 [id number] FCP °CRITICAL RESULT CALLED TO, READ BACK BY AND VERIFIED WITH: B GREEN,PHARMD AT 2206 12/28/2018 BY L BENFIELD °Performed at [facility], [address] [place], [place] [address] °  ° Culture STAPHYLOCOCCUS AUREUS (A)  Final  ° Report Status PENDING  Incomplete  °Blood culture (routine x 2)     Status: Abnormal (Preliminary result)  ° Collection Time: 12/28/18  7:47 AM  ° Specimen: BLOOD  °Result Value Ref Range Status  ° Specimen Description   Final  °  BLOOD RIGHT WRIST °Performed at [facility], [address] [place], [place] [address] °  ° Special Requests   Final  °  BOTTLES DRAWN AEROBIC AND ANAEROBIC Blood Culture results may not be optimal due to an inadequate volume of blood received in culture bottles °Performed at [facility], [address] [place], [place] [address] °  ° Culture  Setup Time   Final  °  GRAM POSITIVE COCCI IN CLUSTERS °IN BOTH AEROBIC AND ANAEROBIC BOTTLES °CRITICAL VALUE NOTED.  VALUE IS CONSISTENT WITH PREVIOUSLY REPORTED AND CALLED VALUE. °Performed at [facility], [address] [place], [place] [address] °  ° Culture STAPHYLOCOCCUS AUREUS (A)  Final  ° Report Status PENDING  Incomplete  °Urine culture     Status: Abnormal (Preliminary result)  ° Collection Time: 12/28/18  7:47 AM  ° Specimen: In/Out Cath Urine  °Result Value Ref Range Status  ° Specimen Description   Final  °  IN/OUT CATH URINE °Performed at [facility], [address] [place], [place] [address] °  ° Special Requests   Final  °  NONE °[facility]formed at  [facility], [address] [place], [place] [address] °  ° Culture (A)  Final  °  30,000 COLONIES/mL STAPHYLOCOCCUS AUREUS °SUSCEPTIBILITIES TO FOLLOW °Performed at [facility], [address] [place],  [address] °  ° Report Status PENDING  Incomplete  °Blood Culture ID Panel (Reflexed)     Status: Abnormal  ° Collection Time: 12/28/18  7:47 AM  °Result Value Ref Range Status  ° Enterococcus species NOT DETECTED NOT DETECTED Final  ° Listeria monocytogenes NOT   DETECTED NOT DETECTED Final  °  Staphylococcus species DETECTED (A) NOT DETECTED Final  °  Comment: CRITICAL RESULT CALLED TO, READ BACK BY AND VERIFIED WITH: °B GREEN,PHARMD AT 2206 12/28/2018 BY L BENFIELD °  ° Staphylococcus aureus (BCID) DETECTED (A) NOT DETECTED Final  °  Comment: Methicillin (oxacillin)-resistant Staphylococcus aureus (MRSA). MRSA is predictably resistant to beta-lactam antibiotics (except ceftaroline). Preferred therapy is vancomycin unless clinically contraindicated. Patient requires contact precautions if  °hospitalized. °CRITICAL RESULT CALLED TO, READ BACK BY AND VERIFIED WITH: °B GREEN,PHARMD AT 2206 12/28/2018 BY L BENFIELD °  ° Methicillin resistance DETECTED (A) NOT DETECTED Final  °  Comment: CRITICAL RESULT CALLED TO, READ BACK BY AND VERIFIED WITH: °B GREEN,PHARMD AT 2206 12/28/2018 BY L BENFIELD °  ° Streptococcus species NOT DETECTED NOT DETECTED Final  ° Streptococcus agalactiae NOT DETECTED NOT DETECTED Final  ° Streptococcus pneumoniae NOT DETECTED NOT DETECTED Final  ° Streptococcus pyogenes NOT DETECTED NOT DETECTED Final  ° Acinetobacter baumannii NOT DETECTED NOT DETECTED Final  ° Enterobacteriaceae species NOT DETECTED NOT DETECTED Final  ° Enterobacter cloacae complex NOT DETECTED NOT DETECTED Final  ° Escherichia coli NOT DETECTED NOT DETECTED Final  ° Klebsiella oxytoca NOT DETECTED NOT DETECTED Final  ° Klebsiella pneumoniae NOT DETECTED NOT DETECTED Final  ° Proteus species NOT DETECTED NOT DETECTED Final  ° Serratia marcescens NOT DETECTED NOT DETECTED Final  ° Haemophilus influenzae NOT DETECTED NOT DETECTED Final  ° Neisseria meningitidis NOT DETECTED NOT DETECTED Final  ° Pseudomonas aeruginosa NOT DETECTED NOT DETECTED Final  ° Candida albicans NOT DETECTED NOT DETECTED Final  ° Candida glabrata NOT DETECTED NOT DETECTED Final  ° Candida krusei NOT DETECTED NOT DETECTED Final  ° Candida parapsilosis NOT DETECTED NOT DETECTED Final  ° Candida tropicalis NOT DETECTED NOT DETECTED Final  °   Comment: Performed at  [facility], [address] [place], [place] [address]  °MRSA PCR Screening     Status: Abnormal  ° Collection Time: 12/28/18 12:51 PM  ° Specimen: Nasal Mucosa; Nasopharyngeal  °Result Value Ref Range Status  ° MRSA by PCR POSITIVE (A) NEGATIVE Final  °  Comment:        °The GeneXpert MRSA Assay (FDA °approved for NASAL specimens °only), is one component of a °comprehensive MRSA colonization °surveillance program. It is not °intended to diagnose MRSA °infection nor to guide or °monitor treatment for °MRSA infections. °RESULT CALLED TO, READ BACK BY AND VERIFIED WITH: °K ZULETA,RN 12/28/18 1507 RHOLMES °Performed at [facility], [address] [place], [place] [address] °  °Respiratory Panel by PCR     Status: None  ° Collection Time: 12/28/18  1:59 PM  ° Specimen: Nasopharyngeal Swab; Respiratory  °Result Value Ref Range Status  ° Adenovirus NOT DETECTED NOT DETECTED Final  ° Coronavirus 229E NOT DETECTED NOT DETECTED Final  °  Comment: (NOTE) °The Coronavirus on the Respiratory Panel, DOES NOT test for the novel  °Coronavirus (2019 nCoV) °  ° Coronavirus HKU1 NOT DETECTED NOT DETECTED Final  ° Coronavirus NL63 NOT DETECTED NOT DETECTED Final  ° Coronavirus OC43 NOT DETECTED NOT DETECTED Final  ° Metapneumovirus NOT DETECTED NOT DETECTED Final  ° Rhinovirus / Enterovirus NOT DETECTED NOT DETECTED Final  ° Influenza A NOT DETECTED NOT DETECTED Final  ° Influenza B NOT DETECTED NOT DETECTED Final  ° Parainfluenza Virus 1 NOT DETECTED NOT DETECTED Final  ° Parainfluenza Virus 2 NOT DETECTED NOT DETECTED Final  ° Parainfluenza Virus   3 NOT DETECTED NOT DETECTED Final  ° Parainfluenza Virus 4 NOT DETECTED NOT DETECTED Final  ° Respiratory Syncytial Virus NOT DETECTED NOT DETECTED Final  ° Bordetella pertussis NOT DETECTED NOT DETECTED Final  ° Chlamydophila pneumoniae NOT DETECTED NOT DETECTED Final  ° Mycoplasma pneumoniae NOT DETECTED NOT DETECTED Final  °  Comment:  Performed at [facility], [address] [place], [place] [address]  °SARS Coronavirus 2 by RT PCR (hospital order, performed in [facility] hospital lab) Nasopharyngeal Nasopharyngeal Swab     Status: None  ° Collection Time: 12/28/18  1:59 PM  ° Specimen: Nasopharyngeal Swab  °Result Value Ref Range Status  ° SARS Coronavirus 2 NEGATIVE NEGATIVE Final  °  Comment: (NOTE) °If result is NEGATIVE °SARS-CoV-2 target nucleic acids are NOT DETECTED. °The SARS-CoV-2 RNA is generally detectable in upper and lower  °respiratory specimens during the acute phase of infection. The lowest  °concentration of SARS-CoV-2 viral copies this assay can detect is 250  °copies / mL. A negative result does not preclude SARS-CoV-2 infection  °and should not be used as the sole basis for treatment or other  °patient management decisions.  A negative result may occur with  °improper specimen collection / handling, submission of specimen other  °than nasopharyngeal swab, presence of viral mutation(s) within the  °areas targeted by this assay, and inadequate number of viral copies  °(<250 copies / mL). A negative result must be combined with clinical  °observations, patient history, and epidemiological information. °If result is POSITIVE °SARS-CoV-2 target nucleic acids are DETECTED. °The SARS-CoV-2 RNA is generally detectable in upper and lower  °respiratory specimens dur °ing the acute phase of infection.  Positive  °results are indicative of active infection with SARS-CoV-2.  Clinical  °correlation with patient history and other diagnostic information is  °necessary to determine patient infection status.  Positive results do  °not rule out bacterial infection or co-infection with other viruses. °If result is PRESUMPTIVE POSTIVE °SARS-CoV-2 nucleic acids MAY BE PRESENT.   °A presumptive positive result was obtained on the submitted specimen  °and confirmed on repeat testing.  While 2019 novel coronavirus  °(SARS-CoV-2) nucleic  acids may be present in the submitted sample  °additional confirmatory testing may be necessary for epidemiological  °and / or clinical management purposes  to differentiate between  °SARS-CoV-2 and other Sarbecovirus currently known to infect humans.  °If clinically indicated additional testing with an alternate test  °methodology ([id number]) is advised. The SARS-CoV-2 RNA is generally  °detectable in upper and lower respiratory sp °ecimens during the acute  °phase of infection. °The expected result is Negative. °Fact Sheet for Patients:  https://www.fda.gov/media/136312/download °Fact Sheet for Healthcare Providers: °https://www.fda.gov/media/136313/download °This test is not yet approved or cleared by the [place] FDA and °has been authorized for detection and/or diagnosis of SARS-CoV-2 by °FDA under an Emergency Use Authorization (EUA).  This EUA will remain °in effect (meaning this test can be used) for the duration of the °COVID-19 declaration under Section 564(b)(1) of the Act, 21 U.S.C. °section 360bbb-3(b)(1), unless the authorization is terminated or °revoked sooner. °Performed at [facility], [address] °[place], [place] [address] °  ° ° °Radiographs and labs were personally reviewed by me.  ° °Christon Parada, MD FACP °[facility] °[facility] °[phone] °12/29/2018, 3:07 PM ° °

## 2018-12-30 ENCOUNTER — Inpatient Hospital Stay (HOSPITAL_COMMUNITY): Payer: Self-pay

## 2018-12-30 DIAGNOSIS — I361 Nonrheumatic tricuspid (valve) insufficiency: Secondary | ICD-10-CM

## 2018-12-30 DIAGNOSIS — B9562 Methicillin resistant Staphylococcus aureus infection as the cause of diseases classified elsewhere: Secondary | ICD-10-CM

## 2018-12-30 DIAGNOSIS — F1910 Other psychoactive substance abuse, uncomplicated: Secondary | ICD-10-CM

## 2018-12-30 DIAGNOSIS — F1990 Other psychoactive substance use, unspecified, uncomplicated: Secondary | ICD-10-CM

## 2018-12-30 LAB — CULTURE, BLOOD (ROUTINE X 2)

## 2018-12-30 LAB — GLUCOSE, CAPILLARY
Glucose-Capillary: 112 mg/dL — ABNORMAL HIGH (ref 70–99)
Glucose-Capillary: 131 mg/dL — ABNORMAL HIGH (ref 70–99)

## 2018-12-30 LAB — ECHOCARDIOGRAM COMPLETE
Height: 72 in
Weight: 1915.36 oz

## 2018-12-30 LAB — URINE CULTURE: Culture: 30000 — AB

## 2018-12-30 LAB — VANCOMYCIN, PEAK: Vancomycin Pk: 31 ug/mL (ref 30–40)

## 2018-12-30 LAB — HSV(HERPES SIMPLEX VRS) I + II AB-IGG
HSV 1 Glycoprotein G Ab, IgG: 55.4 index — ABNORMAL HIGH (ref 0.00–0.90)
HSV 2 Glycoprotein G Ab, IgG: 1.67 index — ABNORMAL HIGH (ref 0.00–0.90)

## 2018-12-30 LAB — RPR: RPR Ser Ql: NONREACTIVE

## 2018-12-30 LAB — HSV-2 IGG SUPPLEMENTAL TEST: HSV-2 IgG Supplemental Test: POSITIVE — AB

## 2018-12-30 MED ORDER — SODIUM CHLORIDE 0.9% FLUSH
10.0000 mL | Freq: Two times a day (BID) | INTRAVENOUS | Status: DC
  Administered 2019-01-01 – 2019-01-06 (×7): 10 mL

## 2018-12-30 MED ORDER — PRO-STAT SUGAR FREE PO LIQD
30.0000 mL | Freq: Two times a day (BID) | ORAL | Status: DC
  Administered 2018-12-30 – 2019-01-05 (×7): 30 mL via ORAL
  Filled 2018-12-30 (×8): qty 30

## 2018-12-30 MED ORDER — SODIUM CHLORIDE 0.9% FLUSH
10.0000 mL | INTRAVENOUS | Status: DC | PRN
  Administered 2019-01-06: 10 mL
  Filled 2018-12-30: qty 40

## 2018-12-30 MED ORDER — ENSURE ENLIVE PO LIQD
237.0000 mL | Freq: Two times a day (BID) | ORAL | Status: DC
  Administered 2018-12-30 – 2019-01-06 (×13): 237 mL via ORAL

## 2018-12-30 NOTE — Progress Notes (Signed)
Echocardiogram 2D Echocardiogram has been performed.  Oneal Deputy Tayonna Bacha 12/30/2018, 12:24 PM

## 2018-12-30 NOTE — Progress Notes (Signed)
2D echo neg for Endocarditis.  Will order TEE to rule it out.  Will notify cardiology and make him NPO PMN.   Gerlean Ren MD [facility]

## 2018-12-30 NOTE — Progress Notes (Signed)
Initial Nutrition Assessment  DOCUMENTATION CODES:   Underweight  INTERVENTION:  - will order Ensure Enlive BID, each supplement provides 350 kcal and 20 grams of protein - will order 30 mL Prostat BID, each supplement provides 100 kcal and 15 grams of protein. - continue to encourage PO intakes. - will attempt NFPE and to obtain nutrition-related information at follow-up.    NUTRITION DIAGNOSIS:   Increased nutrient needs related to acute illness(sepsis) as evidenced by estimated needs.   GOAL:   Patient will meet greater than or equal to 90% of their needs  MONITOR:   PO intake, Supplement acceptance, Labs, Weight trends  REASON FOR ASSESSMENT:   Other (Comment)(underweight BMI)  ASSESSMENT:   33 y.o. male with medical history of alcohol abuse, polysubstance abuse/use (IV heroin, methamphetamines, and opiates), and asthma. He presented to the ED on 10/18 complaining of generalized body aches and fever x1 day. Patient presented with non-specific generalized body aches, including diffuse joint aches, with subjective fever. Patient denied any chest pain, cough, abdominal pain, nausea/vomiting, diarrhea, dizziness, or headaches. Patient is currently homeless; has been living in and out of hotels for the past couple of months. Patient was combative and very agitated/restless in the ED. Fever in the ED on 101.2 degrees F. UDS positive for amphetamines and opiates.  No intakes documented since admission. Unable to see patient d/t ongoing attempts to get out of bed unassisted requiring administration of IV ativan. Per chart review, current weight is 120 lb and weight on 04/29/18 was 150 lb. This indicates 30 lb weight loss (20% body weight) in the past 8 months; significant for time frame. Suspect some degree of malnutrition given weight loss and homelessness with uncertainty about patient's access to food/if he has consistent access to food.   Per notes: - acute metabolic  encephalopathy, acute toxic encephalopathy--a/o self and place - alcohol withdrawal, delirium tremens--improving - possible drug OD - severe sepsis--ID following; possible plan for TEE - MRSA bacteremia - penile lesions--STD testing pending   Labs reviewed; Ca: 8.4 mg/dl. Medications reviewed; 1 mg folvite/day, PRN IV ativan and haldol, daily multivitamin with minerals, 100 mg oral thiamine/day.  IVF; NS @ 100 ml/hr.     NUTRITION - FOCUSED PHYSICAL EXAM:  unable to complete at this time.   Diet Order:   Diet Order            Diet regular Room service appropriate? Yes; Fluid consistency: Thin  Diet effective now              EDUCATION NEEDS:   No education needs have been identified at this time  Skin:  Skin Assessment: Reviewed RN Assessment  Last BM:  PTA/unknown  Height:   Ht Readings from Last 1 Encounters:  12/28/18 6' (1.829 m)    Weight:   Wt Readings from Last 1 Encounters:  12/28/18 54.3 kg    Ideal Body Weight:  80.9 kg  BMI:  Body mass index is 16.24 kg/m.  Estimated Nutritional Needs:   Kcal:  1900-2175 kcal  Protein:  95-110 grams  Fluid:  >/= 2.1 L/day      Jarome Matin, MS, RD, LDN, [facility] Inpatient Clinical Dietitian Pager # [phone] After hours/weekend pager # [phone]

## 2018-12-30 NOTE — Progress Notes (Signed)
NAME:  Alex [name], MRN:  [id number], DOB:  [birth year], LOS: 2 ADMISSION DATE:  12/28/2018, CONSULTATION DATE: 12/28/2018 REFERRING MD: Dr. Sharolyn Douglas, CHIEF COMPLAINT: Agitation  Brief History   33 year old gentleman admitted to the intensive care unit for polysubstance abuse and agitation history of alcohol use methamphetamines and opiates.  History of present illness   33 year old gentleman admitted to the intensive care unit for polysubstance abuse and agitation.  Patient has a past medical history of depression, alcohol abuse and polysubstance abuse.  Per documentation use of methamphetamines as well as opiates.  And alcohol.  The patient presented to the emergency room at approximately 12:30 AM this morning.  Complaints of generalized body aches.  He has a history of IV drug use and the patient was febrile.  Patient also complained on admission to having chills and fevers for the past few days.  Has worsening shortness of breath chest and abdominal pain.  No diarrhea per documentation.  He reported then that his last HIV screen was negative.  He is also been losing weight over the past 2 months per ED documentation.  Per chart review he has history of major depressive disorder with polysubstance dependence, tobacco abuse.  Review of systems in the ED during a time in which she was able to answer questions was positive for fevers chills shortness of breath and myalgias.  Past Medical History   Past Medical History:  Diagnosis Date  . ADHD (attention deficit hyperactivity disorder)   . Allergy   . Asthma   . Depressed   . ETOH abuse   . History of suicidal tendencies    Significant Hospital Events   Stepdown unit admission  Consults:  Pulmonary critical care  Procedures:    Significant Diagnostic Tests:  ECHO 10/20 >>   Micro Data:  COVID-19 10/18 >>  negative  Blood cultures: 10/18 >> +MRSA 4/4 Urine 10/18 >> staph aureus >>   Antimicrobials:  Cefepime 10/18 >> 10/19  Vancomycin 10/18 >>   Interim history/subjective:  No significant agitation reported overnight, more calm this morning Echocardiogram has not yet been done  Objective   Blood pressure 121/83, pulse 93, temperature 97.9 F (36.6 C), resp. rate (!) 22, height 6' (1.829 m), weight 54.3 kg, SpO2 98 %.        Intake/Output Summary (Last 24 hours) at 12/30/2018 0751 Last data filed at 12/30/2018 0046 Gross per 24 hour  Intake 2251.72 ml  Output 500 ml  Net 1751.72 ml   Filed Weights   12/28/18 0040 12/28/18 1254  Weight: 56.7 kg 54.3 kg    Examination: General: Ill-appearing young man, lying in bed HENT: Poor dentition, pupils equal, no icterus Lungs: Clear bilaterally, no crackles, no wheezes Cardiovascular: Regular, no murmur, distant Abdomen: Soft, nondistended, positive bowel sounds Extremities: Thin, no edema scattered bruises Neuro: Very somnolent but does wake to stimulation, briefly answers questions and then quickly back to sleep.  He will follow commands all extremities.  Pupils are equal and react GU: Foley catheter  Resolved Hospital Problem list     Assessment & Plan:   Acute metabolic encephalopathy Acute toxic encephalopathy Alcohol withdrawal, delirium tremens, improving on current regimen Possible drug overdose -Agree with scheduled Librium, Ativan as per CIWA protocol.  Careful with his degree of sedation.  Has adequate airway protection at this time -Haldol available as needed -Folate, thiamine, MVI  Severe Sepsis, MRSA Bacteremia, associated encephalopathy  -Appreciate ID input -Continue vancomycin, cefepime has been stopped -Echocardiogram pending to  evaluate for endocarditis.  Given index of suspicion if negative strongly consider TEE -Repeat blood cultures after 48 hours therapy  Penile Lesions -TRH evaluating.  GC/chlamydia, RPR, HSV   Best practice:   Diet: Regular Pain/Anxiety/Delirium protocol (if indicated): n/a VAP protocol (if  indicated): n/a DVT prophylaxis: Enoxaparin GI prophylaxis: na Glucose control: na Mobility: na Code Status: full  Family Communication: Per TRH Disposition: Stepdown status until mental status is more clear  Labs    CBC: Recent Labs  Lab 12/28/18 0630 12/29/18 0533  WBC 13.3* 14.8*  NEUTROABS 11.1*  --   HGB 11.8* 12.2*  HCT 35.7* 35.5*  MCV 89.0 85.5  PLT 208 161    Basic Metabolic Panel: Recent Labs  Lab 12/28/18 0630 12/29/18 0609 12/29/18 0920  NA 130* 139  --   K 3.9 3.7  --   CL 95* 110  --   CO2 22 18*  --   GLUCOSE 110* 89  --   BUN 8 9  --   CREATININE 0.70 0.62  --   CALCIUM 8.7* 8.4*  --   MG  --   --  1.9  PHOS  --   --  3.9   GFR: Estimated Creatinine Clearance: 100.9 mL/min (by C-G formula based on SCr of 0.62 mg/dL). Recent Labs  Lab 12/28/18 0630 12/28/18 0747 12/28/18 1026 12/29/18 0533  WBC 13.3*  --   --  14.8*  LATICACIDVEN  --  1.0 1.3  --     Liver Function Tests: Recent Labs  Lab 12/28/18 0630 12/29/18 0609  AST 57* 26  ALT 57* 36  ALKPHOS 231* 166*  BILITOT 0.8 0.7  PROT 7.0 6.0*  ALBUMIN 2.9* 2.5*   No results for input(s): LIPASE, AMYLASE in the last 168 hours. No results for input(s): AMMONIA in the last 168 hours.  ABG No results found for: PHART, PCO2ART, PO2ART, HCO3, TCO2, ACIDBASEDEF, O2SAT   Coagulation Profile: Recent Labs  Lab 12/28/18 0747  INR 1.2    Cardiac Enzymes: Recent Labs  Lab 12/28/18 0630  CKTOTAL 52    HbA1C: No results found for: HGBA1C  CBG: Recent Labs  Lab 12/29/18 1153 12/29/18 1726 12/29/18 2340  GLUCAP 89 86 88    Baltazar Apo, MD, PhD 12/30/2018, 8:00 AM [facility] [phone] or if no answer [phone]

## 2018-12-30 NOTE — Progress Notes (Signed)
INFECTIOUS DISEASE PROGRESS NOTE  ID: Alex [name] is a 34 y.o. male with  Principal Problem:   Sepsis (HCC) Active Problems:   Tobacco use disorder   Polysubstance dependence (HCC)   Substance induced mood disorder (HCC)  Subjective: Resting quietly  Abtx:  Anti-infectives (From admission, onward)   Start     Dose/Rate Route Frequency Ordered Stop   12/28/18 1800  vancomycin (VANCOCIN) IVPB 1000 mg/200 mL premix     1,000 mg 200 mL/hr over 60 Minutes Intravenous 2 times daily 12/28/18 0914     12/28/18 1400  ceFEPIme (MAXIPIME) 2 g in sodium chloride 0.9 % 100 mL IVPB  Status:  Discontinued     2 g 200 mL/hr over 30 Minutes Intravenous Every 8 hours 12/28/18 0914 12/29/18 1542   12/28/18 0730  vancomycin (VANCOCIN) 1,250 mg in sodium chloride 0.9 % 250 mL IVPB     1,250 mg 166.7 mL/hr over 90 Minutes Intravenous STAT 12/28/18 0715 12/28/18 0935   12/28/18 0730  ceFEPIme (MAXIPIME) 2 g in sodium chloride 0.9 % 100 mL IVPB     2 g 200 mL/hr over 30 Minutes Intravenous STAT 12/28/18 0715 12/28/18 0833      Medications:  Scheduled: . chlordiazePOXIDE  10 mg Oral TID  . Chlorhexidine Gluconate Cloth  6 each Topical Q0600  . enoxaparin (LOVENOX) injection  40 mg Subcutaneous Q24H  . folic acid  1 mg Oral Daily  . mouth rinse  15 mL Mouth Rinse BID  . multivitamin with minerals  1 tablet Oral Daily  . mupirocin ointment  1 application Nasal BID  . nicotine  21 mg Transdermal Daily  . thiamine  100 mg Oral Daily   Or  . thiamine  100 mg Intravenous Daily    Objective: Vital signs in last 24 hours: Temp:  [97.9 F (36.6 C)-100.6 F (38.1 C)] 97.9 F (36.6 C) (10/20 0500) Pulse Rate:  [73-96] 93 (10/20 0600) Resp:  [11-30] 22 (10/20 0500) BP: (96-143)/(55-99) 121/83 (10/20 0500) SpO2:  [90 %-100 %] 98 % (10/20 0500)   General appearance: no distress Resp: clear to auscultation bilaterally Cardio: regular rate and rhythm GI: normal findings: bowel sounds  normal and soft, non-tender  Lab Results Recent Labs    12/28/18 0630 12/29/18 0533 12/29/18 0609  WBC 13.3* 14.8*  --   HGB 11.8* 12.2*  --   HCT 35.7* 35.5*  --   NA 130*  --  139  K 3.9  --  3.7  CL 95*  --  110  CO2 22  --  18*  BUN 8  --  9  CREATININE 0.70  --  0.62   Liver Panel Recent Labs    12/28/18 0630 12/29/18 0609  PROT 7.0 6.0*  ALBUMIN 2.9* 2.5*  AST 57* 26  ALT 57* 36  ALKPHOS 231* 166*  BILITOT 0.8 0.7   Sedimentation Rate Recent Labs    12/28/18 1025  ESRSEDRATE 105*   C-Reactive Protein Recent Labs    12/28/18 1025  CRP 19.6*    Microbiology: Recent Results (from the past 240 hour(s))  SARS CORONAVIRUS 2 (TAT 6-24 HRS) Nasopharyngeal Nasopharyngeal Swab     Status: None   Collection Time: 12/28/18  5:37 AM   Specimen: Nasopharyngeal Swab  Result Value Ref Range Status   SARS Coronavirus 2 NEGATIVE NEGATIVE Final    Comment: (NOTE) SARS-CoV-2 target nucleic acids are NOT DETECTED. The SARS-CoV-2 RNA is generally detectable in upper and lower  respiratory specimens during the acute phase of infection. Negative results do not preclude SARS-CoV-2 infection, do not rule out co-infections with other pathogens, and should not be used as the sole basis for treatment or other patient management decisions. Negative results must be combined with clinical observations, patient history, and epidemiological information. The expected result is Negative. Fact Sheet for Patients: HairSlick.no Fact Sheet for Healthcare Providers: quierodirigir.com This test is not yet approved or cleared by the [place] FDA and  has been authorized for detection and/or diagnosis of SARS-CoV-2 by FDA under an Emergency Use Authorization (EUA). This EUA will remain  in effect (meaning this test can be used) for the duration of the COVID-19 declaration under Section 56 4(b)(1) of the Act, 21 U.S.C. section  360bbb-3(b)(1), unless the authorization is terminated or revoked sooner. Performed at [facility], [address] [place], [place] [address]   Blood culture (routine x 2)     Status: Abnormal (Preliminary result)   Collection Time: 12/28/18  7:47 AM   Specimen: BLOOD RIGHT ARM  Result Value Ref Range Status   Specimen Description   Final    BLOOD RIGHT ARM Performed at [facility], [address], [place], [place] [address]    Special Requests   Final    BOTTLES DRAWN AEROBIC AND ANAEROBIC Blood Culture results may not be optimal due to an excessive volume of blood received in culture bottles Performed at [facility], [address] [place], [place] [address]    Culture  Setup Time   Final    GRAM POSITIVE COCCI IN BOTH AEROBIC AND ANAEROBIC BOTTLES CRITICAL RESULT CALLED TO, READ BACK BY AND VERIFIED WITH: PHARMD JUSTIN L 1948 [id number] FCP CRITICAL RESULT CALLED TO, READ BACK BY AND VERIFIED WITH: B GREEN,PHARMD AT 2206 12/28/2018 BY L BENFIELD Performed at [facility], [address] [place], [place] [address]    Culture STAPHYLOCOCCUS AUREUS (A)  Final   Report Status PENDING  Incomplete  Blood culture (routine x 2)     Status: Abnormal (Preliminary result)   Collection Time: 12/28/18  7:47 AM   Specimen: BLOOD  Result Value Ref Range Status   Specimen Description   Final    BLOOD RIGHT WRIST Performed at [facility], [address] [place], [place] [address]    Special Requests   Final    BOTTLES DRAWN AEROBIC AND ANAEROBIC Blood Culture results may not be optimal due to an inadequate volume of blood received in culture bottles Performed at [facility], [address] [place], [place] [address]    Culture  Setup Time   Final    GRAM POSITIVE COCCI IN CLUSTERS IN BOTH AEROBIC AND ANAEROBIC BOTTLES CRITICAL VALUE NOTED.  VALUE IS CONSISTENT WITH PREVIOUSLY REPORTED AND CALLED VALUE.  Performed at [facility], [address], [place], [place] [address]    Culture STAPHYLOCOCCUS AUREUS (A)  Final   Report Status PENDING  Incomplete  Urine culture     Status: Abnormal (Preliminary result)   Collection Time: 12/28/18  7:47 AM   Specimen: In/Out Cath Urine  Result Value Ref Range Status   Specimen Description   Final    IN/OUT CATH URINE Performed at [facility], [address] [place], [place] [address]    Special Requests   Final    NONE Performed at [facility], [address], [place], [place] [address]  Culture (A)  Final    30,000 COLONIES/mL STAPHYLOCOCCUS AUREUS SUSCEPTIBILITIES TO FOLLOW Performed at [facility], [address], [place], [place] [address]    Report Status PENDING  Incomplete  Blood Culture ID Panel (Reflexed)     Status: Abnormal   Collection Time: 12/28/18  7:47 AM  Result Value Ref Range Status   Enterococcus species NOT DETECTED NOT DETECTED Final   Listeria monocytogenes NOT DETECTED NOT DETECTED Final   Staphylococcus species DETECTED (A) NOT DETECTED Final    Comment: CRITICAL RESULT CALLED TO, READ BACK BY AND VERIFIED WITH: B GREEN,PHARMD AT 2206 12/28/2018 BY L BENFIELD    Staphylococcus aureus (BCID) DETECTED (A) NOT DETECTED Final    Comment: Methicillin (oxacillin)-resistant Staphylococcus aureus (MRSA). MRSA is predictably resistant to beta-lactam antibiotics (except ceftaroline). Preferred therapy is vancomycin unless clinically contraindicated. Patient requires contact precautions if  hospitalized. CRITICAL RESULT CALLED TO, READ BACK BY AND VERIFIED WITH: B GREEN,PHARMD AT 2206 12/28/2018 BY L BENFIELD    Methicillin resistance DETECTED (A) NOT DETECTED Final    Comment: CRITICAL RESULT CALLED TO, READ BACK BY AND VERIFIED WITH: B GREEN,PHARMD AT 2206 12/28/2018 BY L BENFIELD    Streptococcus species NOT DETECTED NOT DETECTED Final   Streptococcus agalactiae  NOT DETECTED NOT DETECTED Final   Streptococcus pneumoniae NOT DETECTED NOT DETECTED Final   Streptococcus pyogenes NOT DETECTED NOT DETECTED Final   Acinetobacter baumannii NOT DETECTED NOT DETECTED Final   Enterobacteriaceae species NOT DETECTED NOT DETECTED Final   Enterobacter cloacae complex NOT DETECTED NOT DETECTED Final   Escherichia coli NOT DETECTED NOT DETECTED Final   Klebsiella oxytoca NOT DETECTED NOT DETECTED Final   Klebsiella pneumoniae NOT DETECTED NOT DETECTED Final   Proteus species NOT DETECTED NOT DETECTED Final   Serratia marcescens NOT DETECTED NOT DETECTED Final   Haemophilus influenzae NOT DETECTED NOT DETECTED Final   Neisseria meningitidis NOT DETECTED NOT DETECTED Final   Pseudomonas aeruginosa NOT DETECTED NOT DETECTED Final   Candida albicans NOT DETECTED NOT DETECTED Final   Candida glabrata NOT DETECTED NOT DETECTED Final   Candida krusei NOT DETECTED NOT DETECTED Final   Candida parapsilosis NOT DETECTED NOT DETECTED Final   Candida tropicalis NOT DETECTED NOT DETECTED Final    Comment: Performed at [facility], [address], [place], [place] [address]  MRSA PCR Screening     Status: Abnormal   Collection Time: 12/28/18 12:51 PM   Specimen: Nasal Mucosa; Nasopharyngeal  Result Value Ref Range Status   MRSA by PCR POSITIVE (A) NEGATIVE Final    Comment:        The GeneXpert MRSA Assay (FDA approved for NASAL specimens only), is one component of a comprehensive MRSA colonization surveillance program. It is not intended to diagnose MRSA infection nor to guide or monitor treatment for MRSA infections. RESULT CALLED TO, READ BACK BY AND VERIFIED WITH: K ZULETA,RN 12/28/18 1507 RHOLMES Performed at [facility], [address], [place], [place] [address]   Respiratory Panel by PCR     Status: None   Collection Time: 12/28/18  1:59 PM   Specimen: Nasopharyngeal Swab; Respiratory  Result Value Ref Range Status    Adenovirus NOT DETECTED NOT DETECTED Final   Coronavirus 229E NOT DETECTED NOT DETECTED Final    Comment: (NOTE) The Coronavirus on the Respiratory Panel, DOES NOT test for the novel  Coronavirus (2019 nCoV)    Coronavirus HKU1 NOT DETECTED NOT DETECTED Final   Coronavirus  NL63 NOT DETECTED NOT DETECTED Final   Coronavirus OC43 NOT DETECTED NOT DETECTED Final   Metapneumovirus NOT DETECTED NOT DETECTED Final   Rhinovirus / Enterovirus NOT DETECTED NOT DETECTED Final   Influenza A NOT DETECTED NOT DETECTED Final   Influenza B NOT DETECTED NOT DETECTED Final   Parainfluenza Virus 1 NOT DETECTED NOT DETECTED Final   Parainfluenza Virus 2 NOT DETECTED NOT DETECTED Final   Parainfluenza Virus 3 NOT DETECTED NOT DETECTED Final   Parainfluenza Virus 4 NOT DETECTED NOT DETECTED Final   Respiratory Syncytial Virus NOT DETECTED NOT DETECTED Final   Bordetella pertussis NOT DETECTED NOT DETECTED Final   Chlamydophila pneumoniae NOT DETECTED NOT DETECTED Final   Mycoplasma pneumoniae NOT DETECTED NOT DETECTED Final    Comment: Performed at [facility], [address] [place], [place] [address]  SARS Coronavirus 2 by RT PCR (hospital order, performed in [facility] hospital lab) Nasopharyngeal Nasopharyngeal Swab     Status: None   Collection Time: 12/28/18  1:59 PM   Specimen: Nasopharyngeal Swab  Result Value Ref Range Status   SARS Coronavirus 2 NEGATIVE NEGATIVE Final    Comment: (NOTE) If result is NEGATIVE SARS-CoV-2 target nucleic acids are NOT DETECTED. The SARS-CoV-2 RNA is generally detectable in upper and lower  respiratory specimens during the acute phase of infection. The lowest  concentration of SARS-CoV-2 viral copies this assay can detect is 250  copies / mL. A negative result does not preclude SARS-CoV-2 infection  and should not be used as the sole basis for treatment or other  patient management decisions.  A negative result may occur with  improper specimen  collection / handling, submission of specimen other  than nasopharyngeal swab, presence of viral mutation(s) within the  areas targeted by this assay, and inadequate number of viral copies  (<250 copies / mL). A negative result must be combined with clinical  observations, patient history, and epidemiological information. If result is POSITIVE SARS-CoV-2 target nucleic acids are DETECTED. The SARS-CoV-2 RNA is generally detectable in upper and lower  respiratory specimens dur ing the acute phase of infection.  Positive  results are indicative of active infection with SARS-CoV-2.  Clinical  correlation with patient history and other diagnostic information is  necessary to determine patient infection status.  Positive results do  not rule out bacterial infection or co-infection with other viruses. If result is PRESUMPTIVE POSTIVE SARS-CoV-2 nucleic acids MAY BE PRESENT.   A presumptive positive result was obtained on the submitted specimen  and confirmed on repeat testing.  While 2019 novel coronavirus  (SARS-CoV-2) nucleic acids may be present in the submitted sample  additional confirmatory testing may be necessary for epidemiological  and / or clinical management purposes  to differentiate between  SARS-CoV-2 and other Sarbecovirus currently known to infect humans.  If clinically indicated additional testing with an alternate test  methodology [phone] is advised. The SARS-CoV-2 RNA is generally  detectable in upper and lower respiratory sp ecimens during the acute  phase of infection. The expected result is Negative. Fact Sheet for Patients:  BoilerBrush.com.cyhttps://www.fda.gov/media/136312/download Fact Sheet for Healthcare Providers: https://pope.com/https://www.fda.gov/media/136313/download This test is not yet approved or cleared by the [place] FDA and has been authorized for detection and/or diagnosis of SARS-CoV-2 by FDA under an Emergency Use Authorization (EUA).  This EUA will remain in effect  (meaning this test can be used) for the duration of the COVID-19 declaration under Section 564(b)(1) of the Act, 21 U.S.C. section 360bbb-3(b)(1),  unless the authorization is terminated or revoked sooner. Performed at [facility], [place] [address], [place], [place] [address]     Studies/Results: US Abdomen Limited Ruq  Result Date: 12/28/2018 CLINICAL DATA:  Elevated liver enzymes. EXAM: ULTRASOUND ABDOMEN LIMITED RIGHT UPPER QUADRANT COMPARISON:  None. FINDINGS: Gallbladder: No gallstones or wall thickening visualized. No sonographic Murphy sign noted by sonographer. Common bile duct: Diameter: 4.2 mm Liver: No focal lesion identified. Within normal limits in parenchymal echogenicity. Portal vein is patent on color Doppler imaging with normal direction of blood flow towards the liver. Other: None. IMPRESSION: Normal study. Electronically Signed   By: Dorise Bullion III M.D   On: 12/28/2018 17:09     Assessment/Plan: Staph aureus bacteremia, MRSA IVDA ETOH abuse  Total days of antibiotics: 2 vanco  Sedation- not sure if his improved status is from librium, ativan or vicodin.  Please try to repeat TTE Needs IV access for vanco STD results pending         Bobby Rumpf MD, FACP Infectious Diseases (pager) [phone] [email or web address] 12/30/2018, 7:34 AM  LOS: 2 days

## 2018-12-30 NOTE — Progress Notes (Signed)
Patient pulled IV out. RN unable to obtain a new IV. IV team consulted for assistance.

## 2018-12-30 NOTE — Progress Notes (Signed)
PROGRESS NOTE    TASHAWN LASWELL  ZOX:[id number] DOB: [birth year] DOA: 12/28/2018 PCP: Patient, No Pcp Per   Brief Narrative:  33 year old with history of alcohol abuse, polysubstance abuse, asthma came to the hospital complains of generalized body aches and chills.  Uses IV heroine and methamphetamine.  Last meth use was about a week ago and heroin use was prior to ED arrival.  He was noted to be febrile and sepsis without unknown etiology.  UDS was positive for amphetamine, opioids. BCx positive for MRSA, on Vanc. On going work up for Endocarditis.    Assessment & Plan:   Principal Problem:   Sepsis (HCC) Active Problems:   Tobacco use disorder   Polysubstance dependence (HCC)   Substance induced mood disorder (HCC)   IVDU (intravenous drug user)  Sepsis without shock, likely from IV drug use MRSA bacteremia AMS, resolved -Mentation is greatly improved. -Order echocardiogram to rule out infective endocarditis -Continue IV vancomycin-day 2 -Repeat surveillance cultures ordered -Leukocytosis improving -Discontinue Foley.  Regular diet  Hyponatremia -Resolved with fluids  Transaminitis -Repeat lab tomorrow morning -Right upper quadrant-normal  Polysubstance abuse with concerns of active withdrawal Tobacco use -Withdrawal protocol. -Continue Librium to help with his withdrawal -Nicotine patch  History of asthma -As needed bronchodilators  Penile lesion -STI work-up-pending  DVT prophylaxis: Lovenox Code Status: Full Family Communication: Called his father he did not answer therefore left him a voicemail Disposition Plan: Maintain hospital stay for IV antibiotics and ongoing evaluation for bacteremia including endocarditis  Consultants:   PCCM  Procedures:   None  Antimicrobials:   Vancomycin day 3  Cefepime day 3   Subjective: Feels much better this morning.  He is awake alert oriented.  Denies any complaints besides generally feeling very weak.   Tells me he started using IV heroin about a month ago.  Review of Systems Otherwise negative except as per HPI, including: General = no fevers, chills, dizziness, malaise, fatigue HEENT/EYES = negative for pain, redness, loss of vision, double vision, blurred vision, loss of hearing, sore throat, hoarseness, dysphagia Cardiovascular= negative for chest pain, palpitation, murmurs, lower extremity swelling Respiratory/lungs= negative for shortness of breath, cough, hemoptysis, wheezing, mucus production Gastrointestinal= negative for nausea, vomiting,, abdominal pain, melena, hematemesis Genitourinary= negative for Dysuria, Hematuria, Change in Urinary Frequency MSK = Negative for arthralgia, myalgias, Back Pain, Joint swelling  Neurology= Negative for headache, seizures, numbness, tingling  Psychiatry= Negative for anxiety, depression, suicidal and homocidal ideation Allergy/Immunology= Medication/Food allergy as listed  Skin= Negative for Rash, lesions, ulcers, itching   Objective: Vitals:   12/30/18 0700 12/30/18 0800 12/30/18 0845 12/30/18 0900  BP: (!) 98/45 (!) 95/49 106/63 125/89  Pulse:  88 95 94  Resp: (!) 23 (!) 24  20  Temp: (!) 100.4 F (38 C) (!) 100.6 F (38.1 C)  (!) 100.4 F (38 C)  TempSrc:  Core    SpO2:  99%  100%  Weight:      Height:        Intake/Output Summary (Last 24 hours) at 12/30/2018 1048 Last data filed at 12/30/2018 0808 Gross per 24 hour  Intake 2853.09 ml  Output 500 ml  Net 2353.09 ml   Filed Weights   12/28/18 0040 12/28/18 1254  Weight: 56.7 kg 54.3 kg    Examination:  Constitutional: NAD, calm, comfortable Eyes: PERRL, lids and conjunctivae normal ENMT: Mucous membranes are moist. Posterior pharynx clear of any exudate or lesions poor dentition.  Neck: normal, supple, no masses, no thyromegaly  Respiratory: clear to auscultation bilaterally, no wheezing, no crackles. Normal respiratory effort. No accessory muscle use.   Cardiovascular: Regular rate and rhythm, no murmurs / rubs / gallops. No extremity edema. 2+ pedal pulses. No carotid bruits.  Abdomen: no tenderness, no masses palpated. No hepatosplenomegaly. Bowel sounds positive.  Musculoskeletal: no clubbing / cyanosis. No joint deformity upper and lower extremities. Good ROM, no contractures. Normal muscle tone.  Skin: no rashes, lesions, ulcers. No induration Neurologic: CN 2-12 grossly intact. Sensation intact, DTR normal. Strength 5/5 in all 4.  Psychiatric: Normal judgment and insight. Alert and oriented x 3. Normal mood.    Data Reviewed:   CBC: Recent Labs  Lab 12/28/18 0630 12/29/18 0533  WBC 13.3* 14.8*  NEUTROABS 11.1*  --   HGB 11.8* 12.2*  HCT 35.7* 35.5*  MCV 89.0 85.5  PLT 208 696   Basic Metabolic Panel: Recent Labs  Lab 12/28/18 0630 12/29/18 0609 12/29/18 0920  NA 130* 139  --   K 3.9 3.7  --   CL 95* 110  --   CO2 22 18*  --   GLUCOSE 110* 89  --   BUN 8 9  --   CREATININE 0.70 0.62  --   CALCIUM 8.7* 8.4*  --   MG  --   --  1.9  PHOS  --   --  3.9   GFR: Estimated Creatinine Clearance: 100.9 mL/min (by C-G formula based on SCr of 0.62 mg/dL). Liver Function Tests: Recent Labs  Lab 12/28/18 0630 12/29/18 0609  AST 57* 26  ALT 57* 36  ALKPHOS 231* 166*  BILITOT 0.8 0.7  PROT 7.0 6.0*  ALBUMIN 2.9* 2.5*   No results for input(s): LIPASE, AMYLASE in the last 168 hours. No results for input(s): AMMONIA in the last 168 hours. Coagulation Profile: Recent Labs  Lab 12/28/18 0747  INR 1.2   Cardiac Enzymes: Recent Labs  Lab 12/28/18 0630  CKTOTAL 52   BNP (last 3 results) No results for input(s): PROBNP in the last 8760 hours. HbA1C: No results for input(s): HGBA1C in the last 72 hours. CBG: Recent Labs  Lab 12/29/18 1153 12/29/18 1726 12/29/18 2340  GLUCAP 89 86 88   Lipid Profile: No results for input(s): CHOL, HDL, LDLCALC, TRIG, CHOLHDL, LDLDIRECT in the last 72 hours. Thyroid  Function Tests: No results for input(s): TSH, T4TOTAL, FREET4, T3FREE, THYROIDAB in the last 72 hours. Anemia Panel: No results for input(s): VITAMINB12, FOLATE, FERRITIN, TIBC, IRON, RETICCTPCT in the last 72 hours. Sepsis Labs: Recent Labs  Lab 12/28/18 0747 12/28/18 1026  LATICACIDVEN 1.0 1.3    Recent Results (from the past 240 hour(s))  SARS CORONAVIRUS 2 (TAT 6-24 HRS) Nasopharyngeal Nasopharyngeal Swab     Status: None   Collection Time: 12/28/18  5:37 AM   Specimen: Nasopharyngeal Swab  Result Value Ref Range Status   SARS Coronavirus 2 NEGATIVE NEGATIVE Final    Comment: (NOTE) SARS-CoV-2 target nucleic acids are NOT DETECTED. The SARS-CoV-2 RNA is generally detectable in upper and lower respiratory specimens during the acute phase of infection. Negative results do not preclude SARS-CoV-2 infection, do not rule out co-infections with other pathogens, and should not be used as the sole basis for treatment or other patient management decisions. Negative results must be combined with clinical observations, patient history, and epidemiological information. The expected result is Negative. Fact Sheet for Patients: SugarRoll.be Fact Sheet for Healthcare Providers: https://www.woods-mathews.com/ This test is not yet approved or cleared by the [place]  States FDA and  has been authorized for detection and/or diagnosis of SARS-CoV-2 by FDA under an Emergency Use Authorization (EUA). This EUA will remain  in effect (meaning this test can be used) for the duration of the COVID-19 declaration under Section 56 4(b)(1) of the Act, 21 U.S.C. section 360bbb-3(b)(1), unless the authorization is terminated or revoked sooner. Performed at [facility], [address] [place], [place] [address]   Blood culture (routine x 2)     Status: Abnormal   Collection Time: 12/28/18  7:47 AM   Specimen: BLOOD RIGHT ARM  Result Value Ref Range  Status   Specimen Description   Final    BLOOD RIGHT ARM Performed at [facility], [address] [place], [place] [address]    Special Requests   Final    BOTTLES DRAWN AEROBIC AND ANAEROBIC Blood Culture results may not be optimal due to an excessive volume of blood received in culture bottles Performed at [facility], [address] [place], [place] [address]    Culture  Setup Time   Final    GRAM POSITIVE COCCI IN BOTH AEROBIC AND ANAEROBIC BOTTLES CRITICAL RESULT CALLED TO, READ BACK BY AND VERIFIED WITH: PHARMD JUSTIN L 1948 [id number] FCP CRITICAL RESULT CALLED TO, READ BACK BY AND VERIFIED WITH: B GREEN,PHARMD AT 2206 12/28/2018 BY L BENFIELD Performed at [facility], [address], [place], [place] [address]    Culture METHICILLIN RESISTANT STAPHYLOCOCCUS AUREUS (A)  Final   Report Status 12/30/2018 FINAL  Final   Organism ID, Bacteria METHICILLIN RESISTANT STAPHYLOCOCCUS AUREUS  Final      Susceptibility   Methicillin resistant staphylococcus aureus - MIC*    CIPROFLOXACIN >=8 RESISTANT Resistant     ERYTHROMYCIN >=8 RESISTANT Resistant     GENTAMICIN <=0.5 SENSITIVE Sensitive     OXACILLIN >=4 RESISTANT Resistant     TETRACYCLINE <=1 SENSITIVE Sensitive     VANCOMYCIN <=0.5 SENSITIVE Sensitive     TRIMETH/SULFA <=10 SENSITIVE Sensitive     CLINDAMYCIN <=0.25 SENSITIVE Sensitive     RIFAMPIN <=0.5 SENSITIVE Sensitive     Inducible Clindamycin NEGATIVE Sensitive     * METHICILLIN RESISTANT STAPHYLOCOCCUS AUREUS  Blood culture (routine x 2)     Status: Abnormal   Collection Time: 12/28/18  7:47 AM   Specimen: BLOOD  Result Value Ref Range Status   Specimen Description   Final    BLOOD RIGHT WRIST Performed at [facility], [address] [place], [place] [address]    Special Requests   Final    BOTTLES DRAWN AEROBIC AND ANAEROBIC Blood Culture results may not be optimal due to an inadequate volume of blood received  in culture bottles Performed at [facility], [address], [place], [place] [address]    Culture  Setup Time   Final    GRAM POSITIVE COCCI IN CLUSTERS IN BOTH AEROBIC AND ANAEROBIC BOTTLES CRITICAL VALUE NOTED.  VALUE IS CONSISTENT WITH PREVIOUSLY REPORTED AND CALLED VALUE.    Culture (A)  Final    STAPHYLOCOCCUS AUREUS SUSCEPTIBILITIES PERFORMED ON PREVIOUS CULTURE WITHIN THE LAST 5 DAYS. Performed at [facility], [address], [place], [place] [address]    Report Status 12/30/2018 FINAL  Final  Urine culture     Status: Abnormal   Collection Time: 12/28/18  7:47 AM   Specimen: In/Out Cath Urine  Result Value Ref Range Status   Specimen Description  Final    IN/OUT CATH URINE Performed at [facility], [address] [place], [place] [address]    Special Requests   Final    NONE Performed at [facility], [address], [place], [place] [address]    Culture (A)  Final    30,000 COLONIES/mL METHICILLIN RESISTANT STAPHYLOCOCCUS AUREUS   Report Status 12/30/2018 FINAL  Final   Organism ID, Bacteria METHICILLIN RESISTANT STAPHYLOCOCCUS AUREUS (A)  Final      Susceptibility   Methicillin resistant staphylococcus aureus - MIC*    CIPROFLOXACIN >=8 RESISTANT Resistant     GENTAMICIN <=0.5 SENSITIVE Sensitive     NITROFURANTOIN <=16 SENSITIVE Sensitive     OXACILLIN >=4 RESISTANT Resistant     TETRACYCLINE <=1 SENSITIVE Sensitive     VANCOMYCIN 1 SENSITIVE Sensitive     TRIMETH/SULFA <=10 SENSITIVE Sensitive     CLINDAMYCIN <=0.25 SENSITIVE Sensitive     RIFAMPIN <=0.5 SENSITIVE Sensitive     Inducible Clindamycin NEGATIVE Sensitive     * 30,000 COLONIES/mL METHICILLIN RESISTANT STAPHYLOCOCCUS AUREUS  Blood Culture ID Panel (Reflexed)     Status: Abnormal   Collection Time: 12/28/18  7:47 AM  Result Value Ref Range Status   Enterococcus species NOT DETECTED NOT DETECTED Final   Listeria monocytogenes NOT  DETECTED NOT DETECTED Final   Staphylococcus species DETECTED (A) NOT DETECTED Final    Comment: CRITICAL RESULT CALLED TO, READ BACK BY AND VERIFIED WITH: B GREEN,PHARMD AT 2206 12/28/2018 BY L BENFIELD    Staphylococcus aureus (BCID) DETECTED (A) NOT DETECTED Final    Comment: Methicillin (oxacillin)-resistant Staphylococcus aureus (MRSA). MRSA is predictably resistant to beta-lactam antibiotics (except ceftaroline). Preferred therapy is vancomycin unless clinically contraindicated. Patient requires contact precautions if  hospitalized. CRITICAL RESULT CALLED TO, READ BACK BY AND VERIFIED WITH: B GREEN,PHARMD AT 2206 12/28/2018 BY L BENFIELD    Methicillin resistance DETECTED (A) NOT DETECTED Final    Comment: CRITICAL RESULT CALLED TO, READ BACK BY AND VERIFIED WITH: B GREEN,PHARMD AT 2206 12/28/2018 BY L BENFIELD    Streptococcus species NOT DETECTED NOT DETECTED Final   Streptococcus agalactiae NOT DETECTED NOT DETECTED Final   Streptococcus pneumoniae NOT DETECTED NOT DETECTED Final   Streptococcus pyogenes NOT DETECTED NOT DETECTED Final   Acinetobacter baumannii NOT DETECTED NOT DETECTED Final   Enterobacteriaceae species NOT DETECTED NOT DETECTED Final   Enterobacter cloacae complex NOT DETECTED NOT DETECTED Final   Escherichia coli NOT DETECTED NOT DETECTED Final   Klebsiella oxytoca NOT DETECTED NOT DETECTED Final   Klebsiella pneumoniae NOT DETECTED NOT DETECTED Final   Proteus species NOT DETECTED NOT DETECTED Final   Serratia marcescens NOT DETECTED NOT DETECTED Final   Haemophilus influenzae NOT DETECTED NOT DETECTED Final   Neisseria meningitidis NOT DETECTED NOT DETECTED Final   Pseudomonas aeruginosa NOT DETECTED NOT DETECTED Final   Candida albicans NOT DETECTED NOT DETECTED Final   Candida glabrata NOT DETECTED NOT DETECTED Final   Candida krusei NOT DETECTED NOT DETECTED Final   Candida parapsilosis NOT DETECTED NOT DETECTED Final   Candida tropicalis NOT  DETECTED NOT DETECTED Final    Comment: Performed at [facility], [address] [place], [place] [address]  MRSA PCR Screening     Status: Abnormal   Collection Time: 12/28/18 12:51 PM   Specimen: Nasal Mucosa; Nasopharyngeal  Result Value Ref Range Status   MRSA by PCR POSITIVE (A) NEGATIVE Final    Comment:  The GeneXpert MRSA Assay (FDA approved for NASAL specimens only), is one component of a comprehensive MRSA colonization surveillance program. It is not intended to diagnose MRSA infection nor to guide or monitor treatment for MRSA infections. RESULT CALLED TO, READ BACK BY AND VERIFIED WITH: K ZULETA,RN 12/28/18 1507 RHOLMES Performed at [facility], [address] [place], [place] [id number]   Respiratory Panel by PCR     Status: None   Collection Time: 12/28/18  1:59 PM   Specimen: Nasopharyngeal Swab; Respiratory  Result Value Ref Range Status   Adenovirus NOT DETECTED NOT DETECTED Final   Coronavirus 229E NOT DETECTED NOT DETECTED Final    Comment: (NOTE) The Coronavirus on the Respiratory Panel, DOES NOT test for the novel  Coronavirus (2019 nCoV)    Coronavirus HKU1 NOT DETECTED NOT DETECTED Final   Coronavirus NL63 NOT DETECTED NOT DETECTED Final   Coronavirus OC43 NOT DETECTED NOT DETECTED Final   Metapneumovirus NOT DETECTED NOT DETECTED Final   Rhinovirus / Enterovirus NOT DETECTED NOT DETECTED Final   Influenza A NOT DETECTED NOT DETECTED Final   Influenza B NOT DETECTED NOT DETECTED Final   Parainfluenza Virus 1 NOT DETECTED NOT DETECTED Final   Parainfluenza Virus 2 NOT DETECTED NOT DETECTED Final   Parainfluenza Virus 3 NOT DETECTED NOT DETECTED Final   Parainfluenza Virus 4 NOT DETECTED NOT DETECTED Final   Respiratory Syncytial Virus NOT DETECTED NOT DETECTED Final   Bordetella pertussis NOT DETECTED NOT DETECTED Final   Chlamydophila pneumoniae NOT DETECTED NOT DETECTED Final   Mycoplasma pneumoniae NOT DETECTED  NOT DETECTED Final    Comment: Performed at [facility], [address] [place], [place] [address]  SARS Coronavirus 2 by RT PCR (hospital order, performed in [facility] hospital lab) Nasopharyngeal Nasopharyngeal Swab     Status: None   Collection Time: 12/28/18  1:59 PM   Specimen: Nasopharyngeal Swab  Result Value Ref Range Status   SARS Coronavirus 2 NEGATIVE NEGATIVE Final    Comment: (NOTE) If result is NEGATIVE SARS-CoV-2 target nucleic acids are NOT DETECTED. The SARS-CoV-2 RNA is generally detectable in upper and lower  respiratory specimens during the acute phase of infection. The lowest  concentration of SARS-CoV-2 viral copies this assay can detect is 250  copies / mL. A negative result does not preclude SARS-CoV-2 infection  and should not be used as the sole basis for treatment or other  patient management decisions.  A negative result may occur with  improper specimen collection / handling, submission of specimen other  than nasopharyngeal swab, presence of viral mutation(s) within the  areas targeted by this assay, and inadequate number of viral copies  (<250 copies / mL). A negative result must be combined with clinical  observations, patient history, and epidemiological information. If result is POSITIVE SARS-CoV-2 target nucleic acids are DETECTED. The SARS-CoV-2 RNA is generally detectable in upper and lower  respiratory specimens dur ing the acute phase of infection.  Positive  results are indicative of active infection with SARS-CoV-2.  Clinical  correlation with patient history and other diagnostic information is  necessary to determine patient infection status.  Positive results do  not rule out bacterial infection or co-infection with other viruses. If result is PRESUMPTIVE POSTIVE SARS-CoV-2 nucleic acids MAY BE PRESENT.   A presumptive positive result was obtained on the submitted specimen  and confirmed on repeat testing.  While 2019 novel  coronavirus  (SARS-CoV-2) nucleic acids may be present in  the submitted sample  additional confirmatory testing may be necessary for epidemiological  and / or clinical management purposes  to differentiate between  SARS-CoV-2 and other Sarbecovirus currently known to infect humans.  If clinically indicated additional testing with an alternate test  methodology [phone]) is advised. The SARS-CoV-2 RNA is generally  detectable in upper and lower respiratory sp ecimens during the acute  phase of infection. The expected result is Negative. Fact Sheet for Patients:  BoilerBrush.com.cy Fact Sheet for Healthcare Providers: https://pope.com/ This test is not yet approved or cleared by the [place] FDA and has been authorized for detection and/or diagnosis of SARS-CoV-2 by FDA under an Emergency Use Authorization (EUA).  This EUA will remain in effect (meaning this test can be used) for the duration of the COVID-19 declaration under Section 564(b)(1) of the Act, 21 U.S.C. section 360bbb-3(b)(1), unless the authorization is terminated or revoked sooner. Performed at [facility], [address] [place], [place] [address]          Radiology Studies: US Abdomen Limited Ruq  Result Date: 12/28/2018 CLINICAL DATA:  Elevated liver enzymes. EXAM: ULTRASOUND ABDOMEN LIMITED RIGHT UPPER QUADRANT COMPARISON:  None. FINDINGS: Gallbladder: No gallstones or wall thickening visualized. No sonographic Murphy sign noted by sonographer. Common bile duct: Diameter: 4.2 mm Liver: No focal lesion identified. Within normal limits in parenchymal echogenicity. Portal vein is patent on color Doppler imaging with normal direction of blood flow towards the liver. Other: None. IMPRESSION: Normal study. Electronically Signed   By: Gerome Sam III M.D   On: 12/28/2018 17:09        Scheduled Meds: . chlordiazePOXIDE  10 mg Oral TID  .  Chlorhexidine Gluconate Cloth  6 each Topical Q0600  . enoxaparin (LOVENOX) injection  40 mg Subcutaneous Q24H  . folic acid  1 mg Oral Daily  . mouth rinse  15 mL Mouth Rinse BID  . multivitamin with minerals  1 tablet Oral Daily  . mupirocin ointment  1 application Nasal BID  . nicotine  21 mg Transdermal Daily  . thiamine  100 mg Oral Daily   Or  . thiamine  100 mg Intravenous Daily   Continuous Infusions: . sodium chloride 100 mL/hr at 12/30/18 0205  . vancomycin Stopped (12/30/18 1021)     LOS: 2 days   Time spent= 45 mins    Ankit Joline Maxcy, MD [facility]  If 7PM-7AM, please contact night-coverage  12/30/2018, 10:48 AM

## 2018-12-30 NOTE — Progress Notes (Signed)
Multiple I.V. attempts made with and without ultrasound due to patient pulling away. At this time  Patient has a 22 g in left hand/vancomycin to be given in a.m. Please consider better access for infusion of vancomycin.

## 2018-12-31 ENCOUNTER — Inpatient Hospital Stay (HOSPITAL_COMMUNITY): Payer: Self-pay | Admitting: Anesthesiology

## 2018-12-31 ENCOUNTER — Encounter (HOSPITAL_COMMUNITY)
Admission: EM | Discharge status: Against Medical Advice | Payer: Self-pay | Source: Home / Self Care | Attending: Internal Medicine

## 2018-12-31 ENCOUNTER — Encounter (HOSPITAL_COMMUNITY): Payer: Self-pay | Admitting: *Deleted

## 2018-12-31 ENCOUNTER — Inpatient Hospital Stay (HOSPITAL_COMMUNITY): Payer: Self-pay

## 2018-12-31 DIAGNOSIS — A4102 Sepsis due to Methicillin resistant Staphylococcus aureus: Principal | ICD-10-CM

## 2018-12-31 DIAGNOSIS — R6520 Severe sepsis without septic shock: Secondary | ICD-10-CM

## 2018-12-31 DIAGNOSIS — A4101 Sepsis due to Methicillin susceptible Staphylococcus aureus: Secondary | ICD-10-CM

## 2018-12-31 DIAGNOSIS — R7881 Bacteremia: Secondary | ICD-10-CM

## 2018-12-31 DIAGNOSIS — I330 Acute and subacute infective endocarditis: Secondary | ICD-10-CM

## 2018-12-31 DIAGNOSIS — I079 Rheumatic tricuspid valve disease, unspecified: Secondary | ICD-10-CM

## 2018-12-31 DIAGNOSIS — F1990 Other psychoactive substance use, unspecified, uncomplicated: Secondary | ICD-10-CM

## 2018-12-31 HISTORY — PX: TEE WITHOUT CARDIOVERSION: SHX5443

## 2018-12-31 LAB — BASIC METABOLIC PANEL
Anion gap: 8 (ref 5–15)
BUN: 7 mg/dL (ref 6–20)
CO2: 23 mmol/L (ref 22–32)
Calcium: 7.9 mg/dL — ABNORMAL LOW (ref 8.9–10.3)
Chloride: 108 mmol/L (ref 98–111)
Creatinine, Ser: 0.5 mg/dL — ABNORMAL LOW (ref 0.61–1.24)
GFR calc Af Amer: >60 mL/min (ref >60)
GFR calc non Af Amer: >60 mL/min (ref >60)
Glucose, Bld: 146 mg/dL — ABNORMAL HIGH (ref 70–99)
Potassium: 3.1 mmol/L — ABNORMAL LOW (ref 3.5–5.1)
Sodium: 139 mmol/L (ref 135–145)

## 2018-12-31 LAB — CBC
HCT: 32.7 % — ABNORMAL LOW (ref 39.0–52.0)
Hemoglobin: 10.9 g/dL — ABNORMAL LOW (ref 13.0–17.0)
MCH: 29.3 pg (ref 26.0–34.0)
MCHC: 33.3 g/dL (ref 30.0–36.0)
MCV: 87.9 fL (ref 80.0–100.0)
Platelets: 239 10*3/uL (ref 150–400)
RBC: 3.72 MIL/uL — ABNORMAL LOW (ref 4.22–5.81)
RDW: 12.8 % (ref 11.5–15.5)
WBC: 10.2 10*3/uL (ref 4.0–10.5)
nRBC: 0 % (ref 0.0–0.2)

## 2018-12-31 LAB — HSV(HERPES SIMPLEX VRS) I + II AB-IGM: HSVI/II Comb IgM: 1.13 Ratio — ABNORMAL HIGH (ref 0.00–0.90)

## 2018-12-31 LAB — GLUCOSE, CAPILLARY
Glucose-Capillary: 115 mg/dL — ABNORMAL HIGH (ref 70–99)
Glucose-Capillary: 119 mg/dL — ABNORMAL HIGH (ref 70–99)
Glucose-Capillary: 96 mg/dL (ref 70–99)

## 2018-12-31 LAB — VANCOMYCIN, TROUGH: Vancomycin Tr: 5 ug/mL — ABNORMAL LOW (ref 15–20)

## 2018-12-31 LAB — MAGNESIUM: Magnesium: 1.9 mg/dL (ref 1.7–2.4)

## 2018-12-31 SURGERY — ECHOCARDIOGRAM, TRANSESOPHAGEAL
Anesthesia: Monitor Anesthesia Care

## 2018-12-31 MED ORDER — POTASSIUM CHLORIDE 10 MEQ/100ML IV SOLN
INTRAVENOUS | Status: AC
  Filled 2018-12-31: qty 100

## 2018-12-31 MED ORDER — ONDANSETRON HCL 4 MG/2ML IJ SOLN
INTRAMUSCULAR | Status: DC | PRN
  Administered 2018-12-31: 4 mg via INTRAVENOUS

## 2018-12-31 MED ORDER — SODIUM CHLORIDE 0.9 % IV SOLN
INTRAVENOUS | Status: DC
  Administered 2018-12-31: 14:00:00 via INTRAVENOUS

## 2018-12-31 MED ORDER — PROPOFOL 500 MG/50ML IV EMUL
INTRAVENOUS | Status: DC | PRN
  Administered 2018-12-31: 140 ug/kg/min via INTRAVENOUS

## 2018-12-31 MED ORDER — POTASSIUM CHLORIDE 10 MEQ/100ML IV SOLN
10.0000 meq | INTRAVENOUS | Status: AC
  Administered 2018-12-31: 15:00:00 10 meq via INTRAVENOUS
  Filled 2018-12-31: qty 100

## 2018-12-31 MED ORDER — VANCOMYCIN HCL 10 G IV SOLR
1250.0000 mg | Freq: Two times a day (BID) | INTRAVENOUS | Status: DC
  Administered 2018-12-31 – 2019-01-02 (×5): 1250 mg via INTRAVENOUS
  Filled 2018-12-31 (×6): qty 1250

## 2018-12-31 MED ORDER — PROPOFOL 10 MG/ML IV BOLUS
INTRAVENOUS | Status: DC | PRN
  Administered 2018-12-31: 40 mg via INTRAVENOUS
  Administered 2018-12-31: 50 mg via INTRAVENOUS
  Administered 2018-12-31: 30 mg via INTRAVENOUS

## 2018-12-31 MED ORDER — BUTAMBEN-TETRACAINE-BENZOCAINE 2-2-14 % EX AERO
INHALATION_SPRAY | CUTANEOUS | Status: DC | PRN
  Administered 2018-12-31: 14:00:00 2 via TOPICAL

## 2018-12-31 MED ORDER — LIDOCAINE 2% (20 MG/ML) 5 ML SYRINGE
INTRAMUSCULAR | Status: DC | PRN
  Administered 2018-12-31: 60 mg via INTRAVENOUS

## 2018-12-31 MED ORDER — POTASSIUM CHLORIDE 10 MEQ/100ML IV SOLN
10.0000 meq | INTRAVENOUS | Status: AC
  Administered 2018-12-31 (×3): 10 meq via INTRAVENOUS
  Filled 2018-12-31 (×2): qty 100

## 2018-12-31 NOTE — Anesthesia Preprocedure Evaluation (Signed)
Anesthesia Evaluation  Patient identified by MRN, date of birth, ID band Patient awake    Reviewed: Allergy & Precautions, NPO status , Patient's Chart, lab work & pertinent test results  Airway Mallampati: II  TM Distance: >3 FB Neck ROM: Full    Dental  (+) Dental Advisory Given   Pulmonary asthma , Current Smoker,    Pulmonary exam normal breath sounds clear to auscultation       Cardiovascular negative cardio ROS Normal cardiovascular exam Rhythm:Regular Rate:Normal     Neuro/Psych PSYCHIATRIC DISORDERS Depression negative neurological ROS     GI/Hepatic negative GI ROS, Neg liver ROS,   Endo/Other  negative endocrine ROS  Renal/GU negative Renal ROS     Musculoskeletal negative musculoskeletal ROS (+)   Abdominal   Peds  Hematology negative hematology ROS (+)   Anesthesia Other Findings   Reproductive/Obstetrics                             Anesthesia Physical Anesthesia Plan  ASA: III  Anesthesia Plan: MAC   Post-op Pain Management:    Induction: Intravenous  PONV Risk Score and Plan: 0 and Ondansetron  Airway Management Planned:   Additional Equipment:   Intra-op Plan:   Post-operative Plan:   Informed Consent: I have reviewed the patients History and Physical, chart, labs and discussed the procedure including the risks, benefits and alternatives for the proposed anesthesia with the patient or authorized representative who has indicated his/her understanding and acceptance.     Dental advisory given  Plan Discussed with: CRNA  Anesthesia Plan Comments:         Anesthesia Quick Evaluation

## 2018-12-31 NOTE — Anesthesia Postprocedure Evaluation (Signed)
Anesthesia Post Note  Patient: Alex [name]  Procedure(s) Performed: TRANSESOPHAGEAL ECHOCARDIOGRAM (TEE) (N/A )     Patient location during evaluation: PACU Anesthesia Type: MAC Level of consciousness: awake and alert Pain management: pain level controlled Vital Signs Assessment: post-procedure vital signs reviewed and stable Respiratory status: spontaneous breathing Cardiovascular status: stable Anesthetic complications: no    Last Vitals:  Vitals:   12/31/18 1412 12/31/18 1514  BP: 96/74 121/72  Pulse: 87 86  Resp: 17 (!) 22  Temp:  36.7 C  SpO2: 97% 100%    Last Pain:  Vitals:   12/31/18 1514  TempSrc: Axillary  PainSc:                  Nolon Nations

## 2018-12-31 NOTE — Progress Notes (Signed)
Echocardiogram Echocardiogram Transesophageal has been performed.  Alex [name] 12/31/2018, 2:11 PM

## 2018-12-31 NOTE — Progress Notes (Signed)
Per night RN, patient ate crackers and chocolate ice cream at 0330. [facility] endo made aware. Stated pt can proceed with EGD as planned. [facility] already arranged per endo.

## 2018-12-31 NOTE — Progress Notes (Signed)
   [place] has been requested to perform a transesophageal echocardiogram on Alex [name] for bacteremia.  After careful review of history and examination, the risks and benefits of transesophageal echocardiogram have been explained including risks of esophageal damage, perforation (1:10,000 risk), bleeding, pharyngeal hematoma as well as other potential complications associated with conscious sedation including aspiration, arrhythmia, respiratory failure and death. Alternatives to treatment were discussed, questions were answered. Patient is willing to proceed.   Procedure is schedule for today at 13:30 with Dr. Sallyanne Kuster.  Darreld Mclean, PA-C 12/31/2018 9:10 AM

## 2018-12-31 NOTE — Anesthesia Procedure Notes (Signed)
Procedure Name: MAC Date/Time: 12/31/2018 1:48 PM Performed by: Teressa Lower., CRNA Pre-anesthesia Checklist: Patient identified, Emergency Drugs available, Suction available, Patient being monitored and Timeout performed Patient Re-evaluated:Patient Re-evaluated prior to induction Oxygen Delivery Method: Simple face mask

## 2018-12-31 NOTE — Progress Notes (Signed)
PROGRESS NOTE    Alex [name]  ZOX:[id number] DOB: [birth year] DOA: 12/28/2018 PCP: Patient, No Pcp Per   Brief Narrative:  Patient is a 33 year old thin Caucasian male with a past medical history significant for but not limited to alcohol abuse, polysubstance abuse, asthma and other comorbidities who presented to the hospital with chief complaint of generalized body aches and chills.  Uses IV heroin and methamphetamines and last meth use was about a week ago and heroin use was prior to ED arrival.  In the ED he is noted to be febrile on septic without unknown etiology and UDS was positive for amphetamines and opiates.  Blood cultures were positive for MRSA and started on vancomycin.  Further work-up reveals that he has a tricuspid endocarditis with severe tricuspid insufficiency.  Blood cultures x2 have been repeated and are still pending.  Assessment & Plan:   Principal Problem:   Sepsis (HCC) Active Problems:   Tobacco use disorder   Polysubstance dependence (HCC)   Substance induced mood disorder (HCC)   IVDU (intravenous drug user)   Endocarditis of tricuspid valve  Sepsis without shock, likely from IV Drug Use MRSA bacteremia from Tricuspid Endocarditis  AMS in the setting of withdrawal and Bacteremia, Improved  -Mentation is greatly improved but is severely agitated and per nursing keeps getting out of the bed and yelling at the Nursing staff but was calm with me  -Ordered Echocardiogram to rule out infective endocarditis and was normal but TEE showed Tricuspid Valve Endocarditis and Severe Tricuspid Insuffiencey  -CRP was 19.6 and LA was 1.3 -Continue IV Vancomycin-Day 3 -Repeat surveillance cultures pending  -Leukocytosis improving and is now 10.2 -Discontinued Foley -Resumed Regular Diet  -ID following and Appreciate further evaluation and recommendations  Hyponatremia, improved -Resolved with IVF Hydration  -Continue to Monitor and Trend -Repeat CMP in AM    Transaminitis/Abnormal LFTs -AST was 57 and ALT was 57 and now improved as AST is 26 and ALT is 36 -Checked RUQ U/S and was read as a normal study -Continue to Monitor and Trend -Repeat CMP in AM   Polysubstance Abuse with concerns of Active Withdrawal Tobacco Use -C/w Withdrawal Protocol with IV Lorazepam 1-4 mg q1hprn and C/w IV Lorazepam 2 mg q4hprn Agitation -Continue Chlordiazepoxide 10 mg po TID D to help with his withdrawal -C/w Nicotine 21 mg TD Patch -C/w Folic Acid 1 mg po Daily, MVI+Minerals, and Thiamine 100 mg po Daily  -C/w Haloperidol Lactate 5 mg IV q6hprn for Agitation  History of Asthma -C/w Albuterol 2.5 mg Neg q2hprn Wheezing and SOB  Penile Lesion -STI work-up being done and RPR was Non-Reactive -HSV1 Glycoprotein was 55.40, HSV2 Glycoprotein was 1.67, HSV-2 IgG Supplemental Test was Postive and HSVI/II Comb IgM was 1.13 -Hepatitis was negative and GC/Chlaymidia   Underweight -Nutritionist consulted for further evaluation and recommendations -Patient is ordering Ensure Enlive twice daily low 30 mL with Prostat twice daily and continuing to encourage p.o. intakes  Normocytic Anemia -Patient's Hb/HCt is now 10.9/32.7 -Check Anemia Panel in the AM -Continue to Monitor for S/Sx of Bleeding -Repeat CBC in AM    Tricuspid Insuffiencey  -Seen on TEE and was noted to be Severe -Dr. Royann Shivers recommended treating Endocarditis and re-evaluating with Transthoracic ECHO after completion of IV Abx  Hyperglycemia -Likely Reactive -Check HbA1c in AM -Blood Sugars have been ranging from 89-146 on Daily BMP/CMP -Continue to Monitor Blood Sugars carefully and if necessary will place on Sensitive Novolog SSI AC  Electrolyte Abnormalities  Hypokalemia -Patient's K+ this AM was 3.1 -Mag Level was 1.9 -Replete with IV KCl 30 mEQ as patient was NPO -Continue to Monitor and Replete as Necessary  DVT prophylaxis: Enoxaparin 40 mg sq q24h Code Status: FULL CODE   Family Communication: No Family present at bedside  Disposition Plan: Pending further improvement and Clearance by ID  Consultants:   Cardiology for TEE  Infectious Diseases   PCCM/Pulmonary    Procedures:  ECHOCARDIOGRAM 12/30/2018  IMPRESSIONS    1. Left ventricular ejection fraction, by visual estimation, is 60 to 65%. The left ventricle has normal function. Normal left ventricular size. There is no left ventricular hypertrophy.  2. Global right ventricle has normal systolic function.The right ventricular size is normal. No increase in right ventricular wall thickness.  3. Left atrial size was normal.  4. Right atrial size was normal.  5. The mitral valve is normal in structure. No evidence of mitral valve regurgitation. No evidence of mitral stenosis.  6. Thickened TV leaflet with possible mobile vegetation. ricuspid valve regurgitation is mild.  7. AV is poorly visualized but concerning in the parasternal long axis view for possible vegetation on the ventricular side of AV. Aortic valve regurgitation was not visualized by color flow Doppler.  8. The pulmonic valve was normal in structure. Pulmonic valve regurgitation is not visualized by color flow Doppler.  9. Normal pulmonary artery systolic pressure. 10. The inferior vena cava is normal in size with greater than 50% respiratory variability, suggesting right atrial pressure of 3 mmHg. 11. Recommend TEE for further evaluation for possible vegetation on AV and TV.  FINDINGS  Left Ventricle: Left ventricular ejection fraction, by visual estimation, is 60 to 65%. The left ventricle has normal function. There is no left ventricular hypertrophy. Normal left ventricular size.  Right Ventricle: The right ventricular size is normal. No increase in right ventricular wall thickness. Global RV systolic function is has normal systolic function. The tricuspid regurgitant velocity is 2.13 m/s, and with an assumed right atrial pressure   of 3 mmHg, the estimated right ventricular systolic pressure is normal at 21.1 mmHg.  Left Atrium: Left atrial size was normal in size.  Right Atrium: Right atrial size was normal in size  Pericardium: There is no evidence of pericardial effusion.  Mitral Valve: The mitral valve is normal in structure. No evidence of mitral valve stenosis by observation. No evidence of mitral valve regurgitation.  Tricuspid Valve:Tricuspid valve regurgitation is mild by color flow Doppler. Thickened TV leaflet with possible mobile vegetation.    Aortic Valve: The aortic valve was not well visualized. Aortic valve regurgitation was not visualized by color flow Doppler. The aortic valve is structurally normal, with no evidence of sclerosis or stenosis. AV is poorly visualized but concerning in the  parasternal long axis view for possible vegetation on the ventricular side of AV.  Pulmonic Valve: The pulmonic valve was normal in structure. Pulmonic valve regurgitation is not visualized by color flow Doppler.  Aorta: The aortic root, ascending aorta and aortic arch are all structurally normal, with no evidence of dilitation or obstruction.  Venous: The inferior vena cava is normal in size with greater than 50% respiratory variability, suggesting right atrial pressure of 3 mmHg.  IAS/Shunts: No atrial level shunt detected by color flow Doppler. No ventricular septal defect is seen or detected. There is no evidence of an atrial septal defect.     LEFT VENTRICLE PLAX 2D LVIDd:         4.20  cm  Diastology LVIDs:         2.90 cm  LV e' lateral:   12.40 cm/s LV PW:         1.20 cm  LV E/e' lateral: 4.2 LV IVS:        1.10 cm  LV e' medial:    16.20 cm/s LVOT diam:     2.20 cm  LV E/e' medial:  3.2 LV SV:         46 ml LV SV Index:   28.28 LVOT Area:     3.80 cm    RIGHT VENTRICLE RV S prime:     14.10 cm/s TAPSE (M-mode): 2.6 cm  LEFT ATRIUM             Index LA diam:        2.80 cm  1.63 cm/m LA Vol (A2C):   23.8 ml 13.89 ml/m LA Vol (A4C):   34.9 ml 20.37 ml/m LA Biplane Vol: 31.1 ml 18.16 ml/m  AORTIC VALVE LVOT Vmax:   84.90 cm/s LVOT Vmean:  52.200 cm/s LVOT VTI:    0.126 m   AORTA Ao Asc diam: 2.90 cm  MITRAL VALVE                        TRICUSPID VALVE MV Area (PHT): 4.63 cm             TR Peak grad:   18.1 mmHg MV PHT:        47.56 msec           TR Vmax:        216.00 cm/s MV Decel Time: 164 msec MV E velocity: 52.30 cm/s 103 cm/s  SHUNTS MV A velocity: 42.00 cm/s 70.3 cm/s Systemic VTI:  0.13 m MV E/A ratio:  1.25       1.5       Systemic Diam: 2.20 cm  TEE 12/31/2018 IMPRESSIONS    1. Left ventricular ejection fraction, by visual estimation, is 60 to 65%. The left ventricle has normal function. Normal left ventricular size. There is no left ventricular hypertrophy.  2. Global right ventricle has normal systolic function.The right ventricular size is normal. No increase in right ventricular wall thickness.  3. Left atrial size was normal.  4. Right atrial size was normal.  5. The mitral valve is normal in structure. Trace mitral valve regurgitation. No evidence of mitral stenosis.  6. Small vegetation on the tricuspid valve. There is a probable perforation of the anterior leaflet, adjacent to the vegetation.  7. The tricuspid valve is normal in structure. Tricuspid valve regurgitation severe.  8. The aortic valve is normal in structure. Aortic valve regurgitation was not visualized by color flow Doppler. Structurally normal aortic valve, with no evidence of sclerosis or stenosis.  9. The pulmonic valve was normal in structure. Pulmonic valve regurgitation is not visualized by color flow Doppler. 10. Mildly elevated pulmonary artery systolic pressure.  FINDINGS  Left Ventricle: Left ventricular ejection fraction, by visual estimation, is 60 to 65%. The left ventricle has normal function. No evidence of left ventricular regional wall motion  abnormalities. There is no left ventricular hypertrophy. Normal left  ventricular size.  Right Ventricle: The right ventricular size is normal. No increase in right ventricular wall thickness. Global RV systolic function is has normal systolic function. The tricuspid regurgitant velocity is 2.65 m/s, and with an assumed right atrial pressure  of 8 mmHg, the estimated right  ventricular systolic pressure is mildly elevated at 36.1 mmHg.  Left Atrium: Left atrial size was normal in size.  Right Atrium: Right atrial size was normal in size  Pericardium: There is no evidence of pericardial effusion.  Mitral Valve: The mitral valve is normal in structure. No evidence of mitral valve stenosis by observation. Trace mitral valve regurgitation. There is no evidence of mitral valve vegetation.  Tricuspid Valve: The tricuspid valve is normal in structure. Tricuspid valve regurgitation severe by color flow Doppler. There is a small non-mobile tricuspid valve vegetation on the anterior leaflet. The TV vegetation measures 5 mm x 7 mm. There appears  to be a 3 mm diameter perforation in the anterior leaflet.  Aortic Valve: The aortic valve is normal in structure. Aortic valve regurgitation was not visualized by color flow Doppler. The aortic valve is structurally normal, with no evidence of sclerosis or stenosis.  Pulmonic Valve: The pulmonic valve was normal in structure. Pulmonic valve regurgitation is not visualized by color flow Doppler.  Aorta: The aortic root, ascending aorta and aortic arch are all structurally normal, with no evidence of dilitation or obstruction.  Shunts: No ventricular septal defect is seen or detected. There is no evidence of an atrial septal defect. No atrial level shunt detected by color flow Doppler.    TRICUSPID VALVE             Normals TR Peak grad:   28.1 mmHg TR Vmax:        265.00 cm/s 288 cm/s  Antimicrobials:  Anti-infectives (From admission, onward)    Start     Dose/Rate Route Frequency Ordered Stop   12/31/18 1000  vancomycin (VANCOCIN) 1,250 mg in sodium chloride 0.9 % 250 mL IVPB     1,250 mg 166.7 mL/hr over 90 Minutes Intravenous Every 12 hours 12/31/18 0929     12/28/18 1800  vancomycin (VANCOCIN) IVPB 1000 mg/200 mL premix  Status:  Discontinued     1,000 mg 200 mL/hr over 60 Minutes Intravenous 2 times daily 12/28/18 0914 12/31/18 0929   12/28/18 1400  ceFEPIme (MAXIPIME) 2 g in sodium chloride 0.9 % 100 mL IVPB  Status:  Discontinued     2 g 200 mL/hr over 30 Minutes Intravenous Every 8 hours 12/28/18 0914 12/29/18 1542   12/28/18 0730  vancomycin (VANCOCIN) 1,250 mg in sodium chloride 0.9 % 250 mL IVPB     1,250 mg 166.7 mL/hr over 90 Minutes Intravenous STAT 12/28/18 0715 12/28/18 0935   12/28/18 0730  ceFEPIme (MAXIPIME) 2 g in sodium chloride 0.9 % 100 mL IVPB     2 g 200 mL/hr over 30 Minutes Intravenous STAT 12/28/18 0715 12/28/18 0833     Subjective: Seen and examined at bedside and he was resting complaining of some abdominal pain.  No nausea or vomiting.  Wanting to know when his TEE was.  Nursing states that he has been extremely agitated getting up and out of bed but when I saw him he was calm and relaxed.  No other concerns or complaints at this time.  Objective: Vitals:   12/31/18 1232 12/31/18 1404 12/31/18 1412 12/31/18 1514  BP: 116/75 111/69 96/74 121/72  Pulse: 85 87 87 86  Resp: (!) (!) 22  Temp: 98.4 F (36.9 C) 98.5 F (36.9 C)  98 F (36.7 C)  TempSrc: Oral Axillary  Axillary  SpO2: 98% 99% 97% 100%  Weight:      Height:  Intake/Output Summary (Last 24 hours) at 12/31/2018 1612 Last data filed at 12/31/2018 1505 Gross per 24 hour  Intake 2976.9 ml  Output 2725 ml  Net 251.9 ml   Filed Weights   12/28/18 0040 12/28/18 1254  Weight: 56.7 kg 54.3 kg   Examination: Physical Exam:  Constitutional: Thin Caucasian male in NAD and appears calm Eyes: Lids and conjunctivae  normal, sclerae anicteric  ENMT: External Ears, Nose appear normal. Grossly normal hearing. Neck: Appears normal, supple, no cervical masses, normal ROM, no appreciable thyromegaly; no JVD Respiratory: Diminished to auscultation bilaterally, no wheezing, rales, rhonchi or crackles. Normal respiratory effort and patient is not tachypenic. No accessory muscle use. Unlabored breathing  Cardiovascular: RRR, no murmurs / rubs / gallops. S1 and S2 auscultated. No appreciable extremity edema. 2+ pedal pulses. No carotid bruits.  Abdomen: Soft, mildly -tender, non-distended. Bowel sounds positive x4.  GU: Deferred. Musculoskeletal: No clubbing / cyanosis of digits/nails. No joint deformity upper and lower extremities.  Skin: No rashes, lesions, ulcers but has some facial lesions. No induration; Warm and dry.  Neurologic: CN 2-12 grossly intact with no focal deficits. Romberg sign and cerebellar reflexes not assessed.  Psychiatric: Normal judgment and insight. Alert and oriented x 3. Slightly somnolent mood and appropriate affect.   Data Reviewed: I have personally reviewed following labs and imaging studies  CBC: Recent Labs  Lab 12/28/18 0630 12/29/18 0533 12/31/18 0439  WBC 13.3* 14.8* 10.2  NEUTROABS 11.1*  --   --   HGB 11.8* 12.2* 10.9*  HCT 35.7* 35.5* 32.7*  MCV 89.0 85.5 87.9  PLT 208 178 239   Basic Metabolic Panel: Recent Labs  Lab 12/28/18 0630 12/29/18 0609 12/29/18 0920 12/31/18 0439  NA 130* 139  --  139  K 3.9 3.7  --  3.1*  CL 95* 110  --  108  CO2 22 18*  --  23  GLUCOSE 110* 89  --  146*  BUN 8 9  --  7  CREATININE 0.70 0.62  --  0.50*  CALCIUM 8.7* 8.4*  --  7.9*  MG  --   --  1.9 1.9  PHOS  --   --  3.9  --    GFR: Estimated Creatinine Clearance: 100.9 mL/min (A) (by C-G formula based on SCr of 0.5 mg/dL (L)). Liver Function Tests: Recent Labs  Lab 12/28/18 0630 12/29/18 0609  AST 57* 26  ALT 57* 36  ALKPHOS 231* 166*  BILITOT 0.8 0.7  PROT 7.0 6.0*   ALBUMIN 2.9* 2.5*   No results for input(s): LIPASE, AMYLASE in the last 168 hours. No results for input(s): AMMONIA in the last 168 hours. Coagulation Profile: Recent Labs  Lab 12/28/18 0747  INR 1.2   Cardiac Enzymes: Recent Labs  Lab 12/28/18 0630  CKTOTAL 52   BNP (last 3 results) No results for input(s): PROBNP in the last 8760 hours. HbA1C: No results for input(s): HGBA1C in the last 72 hours. CBG: Recent Labs  Lab 12/30/18 1122 12/30/18 1753 12/31/18 0012 12/31/18 0632 12/31/18 1512  GLUCAP 112* 131* 119* 115* 96   Lipid Profile: No results for input(s): CHOL, HDL, LDLCALC, TRIG, CHOLHDL, LDLDIRECT in the last 72 hours. Thyroid Function Tests: No results for input(s): TSH, T4TOTAL, FREET4, T3FREE, THYROIDAB in the last 72 hours. Anemia Panel: No results for input(s): VITAMINB12, FOLATE, FERRITIN, TIBC, IRON, RETICCTPCT in the last 72 hours. Sepsis Labs: Recent Labs  Lab 12/28/18 0747 12/28/18 1026  LATICACIDVEN 1.0 1.3  Recent Results (from the past 240 hour(s))  SARS CORONAVIRUS 2 (TAT 6-24 HRS) Nasopharyngeal Nasopharyngeal Swab     Status: None   Collection Time: 12/28/18  5:37 AM   Specimen: Nasopharyngeal Swab  Result Value Ref Range Status   SARS Coronavirus 2 NEGATIVE NEGATIVE Final    Comment: (NOTE) SARS-CoV-2 target nucleic acids are NOT DETECTED. The SARS-CoV-2 RNA is generally detectable in upper and lower respiratory specimens during the acute phase of infection. Negative results do not preclude SARS-CoV-2 infection, do not rule out co-infections with other pathogens, and should not be used as the sole basis for treatment or other patient management decisions. Negative results must be combined with clinical observations, patient history, and epidemiological information. The expected result is Negative. Fact Sheet for Patients: HairSlick.no Fact Sheet for Healthcare Providers:  quierodirigir.com This test is not yet approved or cleared by the [place] FDA and  has been authorized for detection and/or diagnosis of SARS-CoV-2 by FDA under an Emergency Use Authorization (EUA). This EUA will remain  in effect (meaning this test can be used) for the duration of the COVID-19 declaration under Section 56 4(b)(1) of the Act, 21 U.S.C. section 360bbb-3(b)(1), unless the authorization is terminated or revoked sooner. Performed at [facility], [address], [place], [place] [address]   Blood culture (routine x 2)     Status: Abnormal   Collection Time: 12/28/18  7:47 AM   Specimen: BLOOD RIGHT ARM  Result Value Ref Range Status   Specimen Description   Final    BLOOD RIGHT ARM Performed at [facility], [address] [place], [place] [address]    Special Requests   Final    BOTTLES DRAWN AEROBIC AND ANAEROBIC Blood Culture results may not be optimal due to an excessive volume of blood received in culture bottles Performed at [facility], [address] [place], [place] [address]    Culture  Setup Time   Final    GRAM POSITIVE COCCI IN BOTH AEROBIC AND ANAEROBIC BOTTLES CRITICAL RESULT CALLED TO, READ BACK BY AND VERIFIED WITH: PHARMD JUSTIN L 1948 [id number] FCP CRITICAL RESULT CALLED TO, READ BACK BY AND VERIFIED WITH: B GREEN,PHARMD AT 2206 12/28/2018 BY L BENFIELD Performed at [facility], [address], [place], [place] [address]    Culture METHICILLIN RESISTANT STAPHYLOCOCCUS AUREUS (A)  Final   Report Status 12/30/2018 FINAL  Final   Organism ID, Bacteria METHICILLIN RESISTANT STAPHYLOCOCCUS AUREUS  Final      Susceptibility   Methicillin resistant staphylococcus aureus - MIC*    CIPROFLOXACIN >=8 RESISTANT Resistant     ERYTHROMYCIN >=8 RESISTANT Resistant     GENTAMICIN <=0.5 SENSITIVE Sensitive     OXACILLIN >=4 RESISTANT Resistant     TETRACYCLINE <=1 SENSITIVE Sensitive      VANCOMYCIN <=0.5 SENSITIVE Sensitive     TRIMETH/SULFA <=10 SENSITIVE Sensitive     CLINDAMYCIN <=0.25 SENSITIVE Sensitive     RIFAMPIN <=0.5 SENSITIVE Sensitive     Inducible Clindamycin NEGATIVE Sensitive     * METHICILLIN RESISTANT STAPHYLOCOCCUS AUREUS  Blood culture (routine x 2)     Status: Abnormal   Collection Time: 12/28/18  7:47 AM   Specimen: BLOOD  Result Value Ref Range Status   Specimen Description   Final    BLOOD RIGHT WRIST Performed at [facility], [address] [place], [place] [address]    Special Requests   Final  BOTTLES DRAWN AEROBIC AND ANAEROBIC Blood Culture results may not be optimal due to an inadequate volume of blood received in culture bottles Performed at [facility], [address], [place], [place] [address]    Culture  Setup Time   Final    GRAM POSITIVE COCCI IN CLUSTERS IN BOTH AEROBIC AND ANAEROBIC BOTTLES CRITICAL VALUE NOTED.  VALUE IS CONSISTENT WITH PREVIOUSLY REPORTED AND CALLED VALUE.    Culture (A)  Final    STAPHYLOCOCCUS AUREUS SUSCEPTIBILITIES PERFORMED ON PREVIOUS CULTURE WITHIN THE LAST 5 DAYS. Performed at [facility], [address], [place], [place] [address]    Report Status 12/30/2018 FINAL  Final  Urine culture     Status: Abnormal   Collection Time: 12/28/18  7:47 AM   Specimen: In/Out Cath Urine  Result Value Ref Range Status   Specimen Description   Final    IN/OUT CATH URINE Performed at [facility], [address] [place], [place] [address]    Special Requests   Final    NONE Performed at [facility], [address], [place], [place] [address]    Culture (A)  Final    30,000 COLONIES/mL METHICILLIN RESISTANT STAPHYLOCOCCUS AUREUS   Report Status 12/30/2018 FINAL  Final   Organism ID, Bacteria METHICILLIN RESISTANT STAPHYLOCOCCUS AUREUS (A)  Final      Susceptibility   Methicillin resistant staphylococcus aureus - MIC*     CIPROFLOXACIN >=8 RESISTANT Resistant     GENTAMICIN <=0.5 SENSITIVE Sensitive     NITROFURANTOIN <=16 SENSITIVE Sensitive     OXACILLIN >=4 RESISTANT Resistant     TETRACYCLINE <=1 SENSITIVE Sensitive     VANCOMYCIN 1 SENSITIVE Sensitive     TRIMETH/SULFA <=10 SENSITIVE Sensitive     CLINDAMYCIN <=0.25 SENSITIVE Sensitive     RIFAMPIN <=0.5 SENSITIVE Sensitive     Inducible Clindamycin NEGATIVE Sensitive     * 30,000 COLONIES/mL METHICILLIN RESISTANT STAPHYLOCOCCUS AUREUS  Blood Culture ID Panel (Reflexed)     Status: Abnormal   Collection Time: 12/28/18  7:47 AM  Result Value Ref Range Status   Enterococcus species NOT DETECTED NOT DETECTED Final   Listeria monocytogenes NOT DETECTED NOT DETECTED Final   Staphylococcus species DETECTED (A) NOT DETECTED Final    Comment: CRITICAL RESULT CALLED TO, READ BACK BY AND VERIFIED WITH: B GREEN,PHARMD AT 2206 12/28/2018 BY L BENFIELD    Staphylococcus aureus (BCID) DETECTED (A) NOT DETECTED Final    Comment: Methicillin (oxacillin)-resistant Staphylococcus aureus (MRSA). MRSA is predictably resistant to beta-lactam antibiotics (except ceftaroline). Preferred therapy is vancomycin unless clinically contraindicated. Patient requires contact precautions if  hospitalized. CRITICAL RESULT CALLED TO, READ BACK BY AND VERIFIED WITH: B GREEN,PHARMD AT 2206 12/28/2018 BY L BENFIELD    Methicillin resistance DETECTED (A) NOT DETECTED Final    Comment: CRITICAL RESULT CALLED TO, READ BACK BY AND VERIFIED WITH: B GREEN,PHARMD AT 2206 12/28/2018 BY L BENFIELD    Streptococcus species NOT DETECTED NOT DETECTED Final   Streptococcus agalactiae NOT DETECTED NOT DETECTED Final   Streptococcus pneumoniae NOT DETECTED NOT DETECTED Final   Streptococcus pyogenes NOT DETECTED NOT DETECTED Final   Acinetobacter baumannii NOT DETECTED NOT DETECTED Final   Enterobacteriaceae species NOT DETECTED NOT DETECTED Final   Enterobacter cloacae complex NOT DETECTED  NOT DETECTED Final   Escherichia coli NOT DETECTED NOT DETECTED Final   Klebsiella oxytoca NOT DETECTED NOT DETECTED Final   Klebsiella pneumoniae NOT DETECTED NOT DETECTED Final  Proteus species NOT DETECTED NOT DETECTED Final   Serratia marcescens NOT DETECTED NOT DETECTED Final   Haemophilus influenzae NOT DETECTED NOT DETECTED Final   Neisseria meningitidis NOT DETECTED NOT DETECTED Final   Pseudomonas aeruginosa NOT DETECTED NOT DETECTED Final   Candida albicans NOT DETECTED NOT DETECTED Final   Candida glabrata NOT DETECTED NOT DETECTED Final   Candida krusei NOT DETECTED NOT DETECTED Final   Candida parapsilosis NOT DETECTED NOT DETECTED Final   Candida tropicalis NOT DETECTED NOT DETECTED Final    Comment: Performed at [facility], [address] [place], [place] [address]  MRSA PCR Screening     Status: Abnormal   Collection Time: 12/28/18 12:51 PM   Specimen: Nasal Mucosa; Nasopharyngeal  Result Value Ref Range Status   MRSA by PCR POSITIVE (A) NEGATIVE Final    Comment:        The GeneXpert MRSA Assay (FDA approved for NASAL specimens only), is one component of a comprehensive MRSA colonization surveillance program. It is not intended to diagnose MRSA infection nor to guide or monitor treatment for MRSA infections. RESULT CALLED TO, READ BACK BY AND VERIFIED WITH: K ZULETA,RN 12/28/18 1507 RHOLMES Performed at [facility], [address] [place], [place] [address]   Respiratory Panel by PCR     Status: None   Collection Time: 12/28/18  1:59 PM   Specimen: Nasopharyngeal Swab; Respiratory  Result Value Ref Range Status   Adenovirus NOT DETECTED NOT DETECTED Final   Coronavirus 229E NOT DETECTED NOT DETECTED Final    Comment: (NOTE) The Coronavirus on the Respiratory Panel, DOES NOT test for the novel  Coronavirus (2019 nCoV)    Coronavirus HKU1 NOT DETECTED NOT DETECTED Final   Coronavirus NL63 NOT DETECTED NOT DETECTED Final    Coronavirus OC43 NOT DETECTED NOT DETECTED Final   Metapneumovirus NOT DETECTED NOT DETECTED Final   Rhinovirus / Enterovirus NOT DETECTED NOT DETECTED Final   Influenza A NOT DETECTED NOT DETECTED Final   Influenza B NOT DETECTED NOT DETECTED Final   Parainfluenza Virus 1 NOT DETECTED NOT DETECTED Final   Parainfluenza Virus 2 NOT DETECTED NOT DETECTED Final   Parainfluenza Virus 3 NOT DETECTED NOT DETECTED Final   Parainfluenza Virus 4 NOT DETECTED NOT DETECTED Final   Respiratory Syncytial Virus NOT DETECTED NOT DETECTED Final   Bordetella pertussis NOT DETECTED NOT DETECTED Final   Chlamydophila pneumoniae NOT DETECTED NOT DETECTED Final   Mycoplasma pneumoniae NOT DETECTED NOT DETECTED Final    Comment: Performed at [facility], [address] [place], [place] [address]  SARS Coronavirus 2 by RT PCR (hospital order, performed in [facility] hospital lab) Nasopharyngeal Nasopharyngeal Swab     Status: None   Collection Time: 12/28/18  1:59 PM   Specimen: Nasopharyngeal Swab  Result Value Ref Range Status   SARS Coronavirus 2 NEGATIVE NEGATIVE Final    Comment: (NOTE) If result is NEGATIVE SARS-CoV-2 target nucleic acids are NOT DETECTED. The SARS-CoV-2 RNA is generally detectable in upper and lower  respiratory specimens during the acute phase of infection. The lowest  concentration of SARS-CoV-2 viral copies this assay can detect is 250  copies / mL. A negative result does not preclude SARS-CoV-2 infection  and should not be used as the sole basis for treatment or other  patient management decisions.  A negative result may occur with  improper specimen collection / handling, submission of specimen other  than nasopharyngeal swab, presence of  viral mutation(s) within the  areas targeted by this assay, and inadequate number of viral copies  (<250 copies / mL). A negative result must be combined with clinical  observations, patient history, and epidemiological information.  If result is POSITIVE SARS-CoV-2 target nucleic acids are DETECTED. The SARS-CoV-2 RNA is generally detectable in upper and lower  respiratory specimens dur ing the acute phase of infection.  Positive  results are indicative of active infection with SARS-CoV-2.  Clinical  correlation with patient history and other diagnostic information is  necessary to determine patient infection status.  Positive results do  not rule out bacterial infection or co-infection with other viruses. If result is PRESUMPTIVE POSTIVE SARS-CoV-2 nucleic acids MAY BE PRESENT.   A presumptive positive result was obtained on the submitted specimen  and confirmed on repeat testing.  While 2019 novel coronavirus  (SARS-CoV-2) nucleic acids may be present in the submitted sample  additional confirmatory testing may be necessary for epidemiological  and / or clinical management purposes  to differentiate between  SARS-CoV-2 and other Sarbecovirus currently known to infect humans.  If clinically indicated additional testing with an alternate test  methodology [id number]) is advised. The SARS-CoV-2 RNA is generally  detectable in upper and lower respiratory sp ecimens during the acute  phase of infection. The expected result is Negative. Fact Sheet for Patients:  StrictlyIdeas.no Fact Sheet for Healthcare Providers: BankingDealers.co.za This test is not yet approved or cleared by the [place] FDA and has been authorized for detection and/or diagnosis of SARS-CoV-2 by FDA under an Emergency Use Authorization (EUA).  This EUA will remain in effect (meaning this test can be used) for the duration of the COVID-19 declaration under Section 564(b)(1) of the Act, 21 U.S.C. section 360bbb-3(b)(1), unless the authorization is terminated or revoked sooner. Performed at [facility], [place] [address], [place], [place] [address]   Culture, blood (Routine X 2)  w Reflex to ID Panel     Status: None (Preliminary result)   Collection Time: 12/30/18  8:33 AM   Specimen: BLOOD RIGHT HAND  Result Value Ref Range Status   Specimen Description   Final    BLOOD RIGHT HAND Performed at [place] [address], [place], [place] [address]    Special Requests   Final    BOTTLES DRAWN AEROBIC ONLY Blood Culture adequate volume Performed at [place] [address], [place], [place] [address]    Culture   Final    NO GROWTH < 24 HOURS Performed at [facility] [address], [place], [place] [address]    Report Status PENDING  Incomplete    RN Pressure Injury Documentation:     Estimated body mass index is 16.24 kg/m as calculated from the following:   Height as of this encounter: 6' (1.829 m).   Weight as of this encounter: 54.3 kg.  Malnutrition Type:  Nutrition Problem: Increased nutrient needs Etiology: acute illness(sepsis)   Malnutrition Characteristics:  Signs/Symptoms: estimated needs   Nutrition Interventions:  Interventions: MVI, Prostat, Ensure Enlive (each supplement provides 350kcal and 20 grams of protein)   Radiology Studies: No results found.   Scheduled Meds: . chlordiazePOXIDE  10 mg Oral TID  . Chlorhexidine Gluconate Cloth  6 each Topical Q0600  . enoxaparin (LOVENOX) injection  40 mg Subcutaneous Q24H  . feeding supplement (ENSURE ENLIVE)  237 mL Oral BID BM  . feeding supplement (PRO-STAT SUGAR FREE 64)  30 mL Oral  BID  . folic acid  1 mg Oral Daily  . mouth rinse  15 mL Mouth Rinse BID  . multivitamin with minerals  1 tablet Oral Daily  . mupirocin ointment  1 application Nasal BID  . nicotine  21 mg Transdermal Daily  . sodium chloride flush  10-40 mL Intracatheter Q12H  . thiamine  100 mg Oral Daily   Or  . thiamine  100 mg Intravenous Daily   Continuous Infusions: . sodium chloride 100 mL/hr at 12/31/18 0600  . vancomycin 1,250 mg (12/31/18  0952)    LOS: 3 days   Merlene Laughter, DO [facility] PAGER is on AMION  If 7PM-7AM, please contact night-coverage [email or web address] Password [email or web address] 12/31/2018, 4:12 PM

## 2018-12-31 NOTE — Progress Notes (Addendum)
Pharmacy Antibiotic Note  Alex [name] is a 33 y.o. male w/ hx of substance use disorder, admitted on 12/28/2018 with sepsis. BCID on 10/18 positive for MRSA. Pt currently on vancomycin for MRSA bacteremia. Hx of SUD, TTE negative for endocarditis on 10/20. TTE ordered for 10/21 to rule out endocarditis.  Pharmacy has been consulted for vancomycin dosing.  Today, 12/31/2018 - Day #4 vancomycin for MRSA bacteremia - SCr 0.5 and trending down - Remains afebrile and WBC WNL - Vanc trough 5 mcg/mL, vanc peak 31 mcg/mL, est AUC 376 mcg/mL; est AUC and trough subtherapeutic - will adjust dose as needed  Plan: Vancomycin 1250mg  IV q12h (est AUC 468) Plan next levels 10/24 Measure vancomycin AUC at steady state and as indicated SCr q48h while on vancomycin  Height: 6' (182.9 cm) Weight: 119 lb 11.4 oz (54.3 kg) IBW/kg (Calculated) : 77.6  Temp (24hrs), Avg:98.1 F (36.7 C), Min:97.9 F (36.6 C), Max:98.4 F (36.9 C)  Recent Labs  Lab 12/28/18 0630 12/28/18 0747 12/28/18 1026 12/29/18 0533 12/29/18 0609 12/30/18 2145 12/31/18 0439 12/31/18 0813  WBC 13.3*  --   --  14.8*  --   --  10.2  --   CREATININE 0.70  --   --   --  0.62  --  0.50*  --   LATICACIDVEN  --  1.0 1.3  --   --   --   --   --   VANCOTROUGH  --   --   --   --   --   --   --  5*  VANCOPEAK  --   --   --   --   --  31  --   --     Estimated Creatinine Clearance: 100.9 mL/min (A) (by C-G formula based on SCr of 0.5 mg/dL (L)).    No Known Allergies  Antimicrobials this admission: Cefepime 10/18 >> 10/19 Vancomycin 10/18 >>   Dose adjustments this admission: 10/20: vancomycin 1000mg  q12h >> vancomycin 1250mg  q12h  Microbiology results: 10/18 BCx x2: MRSA 10/18 UCx: MRSA 10/18 MRSA PCR: positive 10/18: Resp Panel: negative 10/18: BCID 4 of 4 MRSA - on Vanc 10/18: COVID: negative 10/21: BCx x2 repeat: no growth < 24 hours  Thank you for allowing pharmacy to be a part of this patient's care.  Natale Lay 12/31/2018 12:33 PM

## 2018-12-31 NOTE — Op Note (Signed)
INDICATIONS: staphylococcal endocarditis of tricuspid valve  PROCEDURE:   Informed consent was obtained prior to the procedure. The risks, benefits and alternatives for the procedure were discussed and the patient comprehended these risks.  Risks include, but are not limited to, cough, sore throat, vomiting, nausea, somnolence, esophageal and stomach trauma or perforation, bleeding, low blood pressure, aspiration, pneumonia, infection, trauma to the teeth and death.    After a procedural time-out, the oropharynx was anesthetized with 20% benzocaine spray.   During this procedure the patient was administered IV propofol by Anesthesiology.  The transesophageal probe was inserted in the esophagus and stomach without difficulty and multiple views were obtained.  The patient was kept under observation until the patient left the procedure room.  The patient left the procedure room in stable condition.   Agitated microbubble saline contrast was not administered.  COMPLICATIONS:    There were no immediate complications.  FINDINGS:  Small vegetation on the anterior leaflet of the tricuspid valve. Severe tricuspid insufficiency. Otherwise normal TEE.  RECOMMENDATIONS:     Treat for endocarditis. Reevaluate with transthoracic echo after completion of IV antibiotics.  Time Spent Directly with the Patient:  30 minutes   Alex [name] 12/31/2018, 1:52 PM

## 2018-12-31 NOTE — Interval H&P Note (Signed)
History and Physical Interval Note:  12/31/2018 1:16 PM  Alex [name]  has presented today for surgery, with the diagnosis of BACTEREMIA , IV DRUG USE.  The various methods of treatment have been discussed with the patient and family. After consideration of risks, benefits and other options for treatment, the patient has consented to  Procedure(s): TRANSESOPHAGEAL ECHOCARDIOGRAM (TEE) (N/A) as a surgical intervention.  The patient's history has been reviewed, patient examined, no change in status, stable for surgery.  I have reviewed the patient's chart and labs.  Questions were answered to the patient's satisfaction.     Lizanne Erker

## 2018-12-31 NOTE — Transfer of Care (Signed)
Immediate Anesthesia Transfer of Care Note  Patient: Alex [name]  Procedure(s) Performed: TRANSESOPHAGEAL ECHOCARDIOGRAM (TEE) (N/A )  Patient Location: Endoscopy Unit  Anesthesia Type:MAC  Level of Consciousness: awake, alert  and oriented  Airway & Oxygen Therapy: Patient Spontanous Breathing and Patient connected to face mask oxygen  Post-op Assessment: Report given to RN and Post -op Vital signs reviewed and stable  Post vital signs: Reviewed and stable  Last Vitals:  Vitals Value Taken Time  BP    Temp    Pulse    Resp    SpO2      Last Pain:  Vitals:   12/31/18 1232  TempSrc: Oral  PainSc: 0-No pain         Complications: No apparent anesthesia complications

## 2018-12-31 NOTE — Progress Notes (Signed)
INFECTIOUS DISEASE PROGRESS NOTE  ID: Alex [name] is a 33 y.o. male with  Principal Problem:   Sepsis (HCC) Active Problems:   Tobacco use disorder   Polysubstance dependence (HCC)   Substance induced mood disorder (HCC)   IVDU (intravenous drug user)  Subjective: Resting post TEE.   Abtx:  Anti-infectives (From admission, onward)   Start     Dose/Rate Route Frequency Ordered Stop   12/28/18 1800  vancomycin (VANCOCIN) IVPB 1000 mg/200 mL premix     1,000 mg 200 mL/hr over 60 Minutes Intravenous 2 times daily 12/28/18 0914     12/28/18 1400  ceFEPIme (MAXIPIME) 2 g in sodium chloride 0.9 % 100 mL IVPB  Status:  Discontinued     2 g 200 mL/hr over 30 Minutes Intravenous Every 8 hours 12/28/18 0914 12/29/18 1542   12/28/18 0730  vancomycin (VANCOCIN) 1,250 mg in sodium chloride 0.9 % 250 mL IVPB     1,250 mg 166.7 mL/hr over 90 Minutes Intravenous STAT 12/28/18 0715 12/28/18 0935   12/28/18 0730  ceFEPIme (MAXIPIME) 2 g in sodium chloride 0.9 % 100 mL IVPB     2 g 200 mL/hr over 30 Minutes Intravenous STAT 12/28/18 0715 12/28/18 0833      Medications:  Scheduled: . chlordiazePOXIDE  10 mg Oral TID  . Chlorhexidine Gluconate Cloth  6 each Topical Q0600  . enoxaparin (LOVENOX) injection  40 mg Subcutaneous Q24H  . feeding supplement (ENSURE ENLIVE)  237 mL Oral BID BM  . feeding supplement (PRO-STAT SUGAR FREE 64)  30 mL Oral BID  . folic acid  1 mg Oral Daily  . mouth rinse  15 mL Mouth Rinse BID  . multivitamin with minerals  1 tablet Oral Daily  . mupirocin ointment  1 application Nasal BID  . nicotine  21 mg Transdermal Daily  . sodium chloride flush  10-40 mL Intracatheter Q12H  . thiamine  100 mg Oral Daily   Or  . thiamine  100 mg Intravenous Daily    Objective: Vital signs in last 24 hours: Temp:  [97.9 F (36.6 C)-100.6 F (38.1 C)] 97.9 F (36.6 C) (10/21 0535) Pulse Rate:  [79-100] 79 (10/21 0535) Resp:  [16-28] 18 (10/21 0535) BP:  (109-135)/(57-92) 119/67 (10/21 0535) SpO2:  [99 %-100 %] 100 % (10/21 0535)   General appearance: no distress Resp: clear to auscultation bilaterally Cardio: regular rate and rhythm GI: normal findings: bowel sounds normal and soft, non-tender  Lab Results Recent Labs    12/29/18 0533 12/29/18 0609 12/31/18 0439  WBC 14.8*  --  10.2  HGB 12.2*  --  10.9*  HCT 35.5*  --  32.7*  NA  --  139 139  K  --  3.7 3.1*  CL  --  110 108  CO2  --  18* 23  BUN  --  9 7  CREATININE  --  0.62 0.50*   Liver Panel Recent Labs    12/29/18 0609  PROT 6.0*  ALBUMIN 2.5*  AST 26  ALT 36  ALKPHOS 166*  BILITOT 0.7   Sedimentation Rate Recent Labs    12/28/18 1025  ESRSEDRATE 105*   C-Reactive Protein Recent Labs    12/28/18 1025  CRP 19.6*    Microbiology: Recent Results (from the past 240 hour(s))  SARS CORONAVIRUS 2 (TAT 6-24 HRS) Nasopharyngeal Nasopharyngeal Swab     Status: None   Collection Time: 12/28/18  5:37 AM   Specimen: Nasopharyngeal Swab  Result Value Ref Range Status   SARS Coronavirus 2 NEGATIVE NEGATIVE Final    Comment: (NOTE) SARS-CoV-2 target nucleic acids are NOT DETECTED. The SARS-CoV-2 RNA is generally detectable in upper and lower respiratory specimens during the acute phase of infection. Negative results do not preclude SARS-CoV-2 infection, do not rule out co-infections with other pathogens, and should not be used as the sole basis for treatment or other patient management decisions. Negative results must be combined with clinical observations, patient history, and epidemiological information. The expected result is Negative. Fact Sheet for Patients: HairSlick.nohttps://www.fda.gov/media/138098/download Fact Sheet for Healthcare Providers: quierodirigir.comhttps://www.fda.gov/media/138095/download This test is not yet approved or cleared by the [place] FDA and  has been authorized for detection and/or diagnosis of SARS-CoV-2 by FDA under an Emergency Use  Authorization (EUA). This EUA will remain  in effect (meaning this test can be used) for the duration of the COVID-19 declaration under Section 56 4(b)(1) of the Act, 21 U.S.C. section 360bbb-3(b)(1), unless the authorization is terminated or revoked sooner. Performed at [facility], [address] [place], [place] [address]   Blood culture (routine x 2)     Status: Abnormal   Collection Time: 12/28/18  7:47 AM   Specimen: BLOOD RIGHT ARM  Result Value Ref Range Status   Specimen Description   Final    BLOOD RIGHT ARM Performed at [facility], [address] [place], [place] [address]    Special Requests   Final    BOTTLES DRAWN AEROBIC AND ANAEROBIC Blood Culture results may not be optimal due to an excessive volume of blood received in culture bottles Performed at [facility], [address] [place], [place] [address]    Culture  Setup Time   Final    GRAM POSITIVE COCCI IN BOTH AEROBIC AND ANAEROBIC BOTTLES CRITICAL RESULT CALLED TO, READ BACK BY AND VERIFIED WITH: PHARMD JUSTIN L 1948 [id number] FCP CRITICAL RESULT CALLED TO, READ BACK BY AND VERIFIED WITH: B GREEN,PHARMD AT 2206 12/28/2018 BY L BENFIELD Performed at [facility], [address] [place], [place] [address]    Culture METHICILLIN RESISTANT STAPHYLOCOCCUS AUREUS (A)  Final   Report Status 12/30/2018 FINAL  Final   Organism ID, Bacteria METHICILLIN RESISTANT STAPHYLOCOCCUS AUREUS  Final      Susceptibility   Methicillin resistant staphylococcus aureus - MIC*    CIPROFLOXACIN >=8 RESISTANT Resistant     ERYTHROMYCIN >=8 RESISTANT Resistant     GENTAMICIN <=0.5 SENSITIVE Sensitive     OXACILLIN >=4 RESISTANT Resistant     TETRACYCLINE <=1 SENSITIVE Sensitive     VANCOMYCIN <=0.5 SENSITIVE Sensitive     TRIMETH/SULFA <=10 SENSITIVE Sensitive     CLINDAMYCIN <=0.25 SENSITIVE Sensitive     RIFAMPIN <=0.5 SENSITIVE Sensitive     Inducible Clindamycin NEGATIVE Sensitive      * METHICILLIN RESISTANT STAPHYLOCOCCUS AUREUS  Blood culture (routine x 2)     Status: Abnormal   Collection Time: 12/28/18  7:47 AM   Specimen: BLOOD  Result Value Ref Range Status   Specimen Description   Final    BLOOD RIGHT WRIST Performed at [facility], [address] [place], [place] [address]    Special Requests   Final    BOTTLES DRAWN AEROBIC AND ANAEROBIC Blood Culture results may not be optimal due to an inadequate volume of blood received in culture bottles Performed at [facility], [address] [place], [place] [address]  Culture  Setup Time   Final    GRAM POSITIVE COCCI IN CLUSTERS IN BOTH AEROBIC AND ANAEROBIC BOTTLES CRITICAL VALUE NOTED.  VALUE IS CONSISTENT WITH PREVIOUSLY REPORTED AND CALLED VALUE.    Culture (A)  Final    STAPHYLOCOCCUS AUREUS SUSCEPTIBILITIES PERFORMED ON PREVIOUS CULTURE WITHIN THE LAST 5 DAYS. Performed at [facility], [address], [place], [place] [address]    Report Status 12/30/2018 FINAL  Final  Urine culture     Status: Abnormal   Collection Time: 12/28/18  7:47 AM   Specimen: In/Out Cath Urine  Result Value Ref Range Status   Specimen Description   Final    IN/OUT CATH URINE Performed at [facility], [address] [place], [place] [address]    Special Requests   Final    NONE Performed at [facility], [address] [place], [place] [address]    Culture (A)  Final    30,000 COLONIES/mL METHICILLIN RESISTANT STAPHYLOCOCCUS AUREUS   Report Status 12/30/2018 FINAL  Final   Organism ID, Bacteria METHICILLIN RESISTANT STAPHYLOCOCCUS AUREUS (A)  Final      Susceptibility   Methicillin resistant staphylococcus aureus - MIC*    CIPROFLOXACIN >=8 RESISTANT Resistant     GENTAMICIN <=0.5 SENSITIVE Sensitive     NITROFURANTOIN <=16 SENSITIVE Sensitive     OXACILLIN >=4 RESISTANT Resistant     TETRACYCLINE <=1 SENSITIVE Sensitive      VANCOMYCIN 1 SENSITIVE Sensitive     TRIMETH/SULFA <=10 SENSITIVE Sensitive     CLINDAMYCIN <=0.25 SENSITIVE Sensitive     RIFAMPIN <=0.5 SENSITIVE Sensitive     Inducible Clindamycin NEGATIVE Sensitive     * 30,000 COLONIES/mL METHICILLIN RESISTANT STAPHYLOCOCCUS AUREUS  Blood Culture ID Panel (Reflexed)     Status: Abnormal   Collection Time: 12/28/18  7:47 AM  Result Value Ref Range Status   Enterococcus species NOT DETECTED NOT DETECTED Final   Listeria monocytogenes NOT DETECTED NOT DETECTED Final   Staphylococcus species DETECTED (A) NOT DETECTED Final    Comment: CRITICAL RESULT CALLED TO, READ BACK BY AND VERIFIED WITH: B GREEN,PHARMD AT 2206 12/28/2018 BY L BENFIELD    Staphylococcus aureus (BCID) DETECTED (A) NOT DETECTED Final    Comment: Methicillin (oxacillin)-resistant Staphylococcus aureus (MRSA). MRSA is predictably resistant to beta-lactam antibiotics (except ceftaroline). Preferred therapy is vancomycin unless clinically contraindicated. Patient requires contact precautions if  hospitalized. CRITICAL RESULT CALLED TO, READ BACK BY AND VERIFIED WITH: B GREEN,PHARMD AT 2206 12/28/2018 BY L BENFIELD    Methicillin resistance DETECTED (A) NOT DETECTED Final    Comment: CRITICAL RESULT CALLED TO, READ BACK BY AND VERIFIED WITH: B GREEN,PHARMD AT 2206 12/28/2018 BY L BENFIELD    Streptococcus species NOT DETECTED NOT DETECTED Final   Streptococcus agalactiae NOT DETECTED NOT DETECTED Final   Streptococcus pneumoniae NOT DETECTED NOT DETECTED Final   Streptococcus pyogenes NOT DETECTED NOT DETECTED Final   Acinetobacter baumannii NOT DETECTED NOT DETECTED Final   Enterobacteriaceae species NOT DETECTED NOT DETECTED Final   Enterobacter cloacae complex NOT DETECTED NOT DETECTED Final   Escherichia coli NOT DETECTED NOT DETECTED Final   Klebsiella oxytoca NOT DETECTED NOT DETECTED Final   Klebsiella pneumoniae NOT DETECTED NOT DETECTED Final   Proteus species NOT DETECTED  NOT DETECTED Final   Serratia marcescens NOT DETECTED NOT DETECTED Final   Haemophilus influenzae NOT DETECTED NOT DETECTED Final   Neisseria meningitidis NOT DETECTED NOT DETECTED Final   Pseudomonas aeruginosa  NOT DETECTED NOT DETECTED Final   Candida albicans NOT DETECTED NOT DETECTED Final   Candida glabrata NOT DETECTED NOT DETECTED Final   Candida krusei NOT DETECTED NOT DETECTED Final   Candida parapsilosis NOT DETECTED NOT DETECTED Final   Candida tropicalis NOT DETECTED NOT DETECTED Final    Comment: Performed at [facility], [place] [address] [place], [place] [address]  MRSA PCR Screening     Status: Abnormal   Collection Time: 12/28/18 12:51 PM   Specimen: Nasal Mucosa; Nasopharyngeal  Result Value Ref Range Status   MRSA by PCR POSITIVE (A) NEGATIVE Final    Comment:        The GeneXpert MRSA Assay (FDA approved for NASAL specimens only), is one component of a comprehensive MRSA colonization surveillance program. It is not intended to diagnose MRSA infection nor to guide or monitor treatment for MRSA infections. RESULT CALLED TO, READ BACK BY AND VERIFIED WITH: K ZULETA,RN 12/28/18 1507 RHOLMES Performed at [facility], [place] [address], [place], [place] [address]   Respiratory Panel by PCR     Status: None   Collection Time: 12/28/18  1:59 PM   Specimen: Nasopharyngeal Swab; Respiratory  Result Value Ref Range Status   Adenovirus NOT DETECTED NOT DETECTED Final   Coronavirus 229E NOT DETECTED NOT DETECTED Final    Comment: (NOTE) The Coronavirus on the Respiratory Panel, DOES NOT test for the novel  Coronavirus (2019 nCoV)    Coronavirus HKU1 NOT DETECTED NOT DETECTED Final   Coronavirus NL63 NOT DETECTED NOT DETECTED Final   Coronavirus OC43 NOT DETECTED NOT DETECTED Final   Metapneumovirus NOT DETECTED NOT DETECTED Final   Rhinovirus / Enterovirus NOT DETECTED NOT DETECTED Final   Influenza A NOT DETECTED NOT DETECTED Final    Influenza B NOT DETECTED NOT DETECTED Final   Parainfluenza Virus 1 NOT DETECTED NOT DETECTED Final   Parainfluenza Virus 2 NOT DETECTED NOT DETECTED Final   Parainfluenza Virus 3 NOT DETECTED NOT DETECTED Final   Parainfluenza Virus 4 NOT DETECTED NOT DETECTED Final   Respiratory Syncytial Virus NOT DETECTED NOT DETECTED Final   Bordetella pertussis NOT DETECTED NOT DETECTED Final   Chlamydophila pneumoniae NOT DETECTED NOT DETECTED Final   Mycoplasma pneumoniae NOT DETECTED NOT DETECTED Final    Comment: Performed at [facility], [place]. [address] [place],  [address]  SARS Coronavirus 2 by RT PCR (hospital order, performed in [facility] hospital lab) Nasopharyngeal Nasopharyngeal Swab     Status: None   Collection Time: 12/28/18  1:59 PM   Specimen: Nasopharyngeal Swab  Result Value Ref Range Status   SARS Coronavirus 2 NEGATIVE NEGATIVE Final    Comment: (NOTE) If result is NEGATIVE SARS-CoV-2 target nucleic acids are NOT DETECTED. The SARS-CoV-2 RNA is generally detectable in upper and lower  respiratory specimens during the acute phase of infection. The lowest  concentration of SARS-CoV-2 viral copies this assay can detect is 250  copies / mL. A negative result does not preclude SARS-CoV-2 infection  and should not be used as the sole basis for treatment or other  patient management decisions.  A negative result may occur with  improper specimen collection / handling, submission of specimen other  than nasopharyngeal swab, presence of viral mutation(s) within the  areas targeted by this assay, and inadequate number of viral copies  (<250 copies / mL). A negative result must be combined with clinical  observations, patient history, and epidemiological information. If result  is POSITIVE SARS-CoV-2 target nucleic acids are DETECTED. The SARS-CoV-2 RNA is generally detectable in upper and lower  respiratory specimens dur ing the acute phase of infection.  Positive   results are indicative of active infection with SARS-CoV-2.  Clinical  correlation with patient history and other diagnostic information is  necessary to determine patient infection status.  Positive results do  not rule out bacterial infection or co-infection with other viruses. If result is PRESUMPTIVE POSTIVE SARS-CoV-2 nucleic acids MAY BE PRESENT.   A presumptive positive result was obtained on the submitted specimen  and confirmed on repeat testing.  While 2019 novel coronavirus  (SARS-CoV-2) nucleic acids may be present in the submitted sample  additional confirmatory testing may be necessary for epidemiological  and / or clinical management purposes  to differentiate between  SARS-CoV-2 and other Sarbecovirus currently known to infect humans.  If clinically indicated additional testing with an alternate test  methodology [id number]) is advised. The SARS-CoV-2 RNA is generally  detectable in upper and lower respiratory sp ecimens during the acute  phase of infection. The expected result is Negative. Fact Sheet for Patients:  BoilerBrush.com.cy Fact Sheet for Healthcare Providers: https://pope.com/ This test is not yet approved or cleared by the [place] FDA and has been authorized for detection and/or diagnosis of SARS-CoV-2 by FDA under an Emergency Use Authorization (EUA).  This EUA will remain in effect (meaning this test can be used) for the duration of the COVID-19 declaration under Section 564(b)(1) of the Act, 21 U.S.C. section 360bbb-3(b)(1), unless the authorization is terminated or revoked sooner. Performed at [facility], [address], [place], [place] [address]   Culture, blood (Routine X 2) w Reflex to ID Panel     Status: None (Preliminary result)   Collection Time: 12/30/18  8:33 AM   Specimen: BLOOD RIGHT HAND  Result Value Ref Range Status   Specimen Description   Final    BLOOD RIGHT  HAND Performed at [facility], [address] [place], [place] [address]    Special Requests   Final    BOTTLES DRAWN AEROBIC ONLY Blood Culture adequate volume Performed at [facility], [address], [place], [place] [address]    Culture   Final    NO GROWTH < 24 HOURS Performed at [facility], [address], [place], [place] [address]    Report Status PENDING  Incomplete    Studies/Results: No results found.   Assessment/Plan: Staph aureus bacteremia, MRSA TV IE IVDA ETOH abuse  Total days of antibiotics: 3 vanco  Appreciate TRH arranging TEE. TV IE seen on study Repeat BCx sent 10-20, pending.  Continue vanco.  Cr stable Detox as able.          Johny Sax MD, FACP Infectious Diseases (pager) [phone] [email or web address] 12/31/2018, 9:08 AM  LOS: 3 days

## 2019-01-01 DIAGNOSIS — I071 Rheumatic tricuspid insufficiency: Secondary | ICD-10-CM

## 2019-01-01 LAB — CBC WITH DIFFERENTIAL/PLATELET
Abs Immature Granulocytes: 0.04 10*3/uL (ref 0.00–0.07)
Basophils Absolute: 0 10*3/uL (ref 0.0–0.1)
Basophils Relative: 0 %
Eosinophils Absolute: 0.1 10*3/uL (ref 0.0–0.5)
Eosinophils Relative: 1 %
HCT: 29.8 % — ABNORMAL LOW (ref 39.0–52.0)
Hemoglobin: 9.8 g/dL — ABNORMAL LOW (ref 13.0–17.0)
Immature Granulocytes: 0 %
Lymphocytes Relative: 19 %
Lymphs Abs: 1.9 10*3/uL (ref 0.7–4.0)
MCH: 29.1 pg (ref 26.0–34.0)
MCHC: 32.9 g/dL (ref 30.0–36.0)
MCV: 88.4 fL (ref 80.0–100.0)
Monocytes Absolute: 1 10*3/uL (ref 0.1–1.0)
Monocytes Relative: 10 %
Neutro Abs: 6.9 10*3/uL (ref 1.7–7.7)
Neutrophils Relative %: 70 %
Platelets: 265 10*3/uL (ref 150–400)
RBC: 3.37 MIL/uL — ABNORMAL LOW (ref 4.22–5.81)
RDW: 12.9 % (ref 11.5–15.5)
WBC: 10 10*3/uL (ref 4.0–10.5)
nRBC: 0 % (ref 0.0–0.2)

## 2019-01-01 LAB — GLUCOSE, CAPILLARY
Glucose-Capillary: 101 mg/dL — ABNORMAL HIGH (ref 70–99)
Glucose-Capillary: 107 mg/dL — ABNORMAL HIGH (ref 70–99)
Glucose-Capillary: 107 mg/dL — ABNORMAL HIGH (ref 70–99)
Glucose-Capillary: 80 mg/dL (ref 70–99)

## 2019-01-01 LAB — RETICULOCYTES
Immature Retic Fract: 12.1 % (ref 2.3–15.9)
RBC.: 3.37 MIL/uL — ABNORMAL LOW (ref 4.22–5.81)
Retic Count, Absolute: 10.4 10*3/uL — ABNORMAL LOW (ref 19.0–186.0)
Retic Ct Pct: 0.4 % (ref 0.4–3.1)

## 2019-01-01 LAB — FOLATE: Folate: 8.9 ng/mL (ref >5.9)

## 2019-01-01 LAB — COMPREHENSIVE METABOLIC PANEL
ALT: 27 U/L (ref 0–44)
AST: 22 U/L (ref 15–41)
Albumin: 2.2 g/dL — ABNORMAL LOW (ref 3.5–5.0)
Alkaline Phosphatase: 151 U/L — ABNORMAL HIGH (ref 38–126)
Anion gap: 8 (ref 5–15)
BUN: 5 mg/dL — ABNORMAL LOW (ref 6–20)
CO2: 24 mmol/L (ref 22–32)
Calcium: 8.2 mg/dL — ABNORMAL LOW (ref 8.9–10.3)
Chloride: 106 mmol/L (ref 98–111)
Creatinine, Ser: 0.51 mg/dL — ABNORMAL LOW (ref 0.61–1.24)
GFR calc Af Amer: >60 mL/min (ref >60)
GFR calc non Af Amer: >60 mL/min (ref >60)
Glucose, Bld: 125 mg/dL — ABNORMAL HIGH (ref 70–99)
Potassium: 3.5 mmol/L (ref 3.5–5.1)
Sodium: 138 mmol/L (ref 135–145)
Total Bilirubin: 0.4 mg/dL (ref 0.3–1.2)
Total Protein: 5.9 g/dL — ABNORMAL LOW (ref 6.5–8.1)

## 2019-01-01 LAB — PHOSPHORUS: Phosphorus: 3.3 mg/dL (ref 2.5–4.6)

## 2019-01-01 LAB — FERRITIN: Ferritin: 208 ng/mL (ref 24–336)

## 2019-01-01 LAB — IRON AND TIBC
Iron: 16 ug/dL — ABNORMAL LOW (ref 45–182)
Saturation Ratios: 9 % — ABNORMAL LOW (ref 17.9–39.5)
TIBC: 184 ug/dL — ABNORMAL LOW (ref 250–450)
UIBC: 168 ug/dL

## 2019-01-01 LAB — HEMOGLOBIN A1C
Hgb A1c MFr Bld: 6.1 % — ABNORMAL HIGH (ref 4.8–5.6)
Mean Plasma Glucose: 128.37 mg/dL

## 2019-01-01 LAB — VITAMIN B12: Vitamin B-12: 436 pg/mL (ref 180–914)

## 2019-01-01 LAB — MAGNESIUM: Magnesium: 1.9 mg/dL (ref 1.7–2.4)

## 2019-01-01 MED ORDER — POTASSIUM CHLORIDE CRYS ER 20 MEQ PO TBCR
40.0000 meq | EXTENDED_RELEASE_TABLET | Freq: Once | ORAL | Status: AC
  Administered 2019-01-01: 40 meq via ORAL
  Filled 2019-01-01: qty 2

## 2019-01-01 MED ORDER — POLYSACCHARIDE IRON COMPLEX 150 MG PO CAPS
150.0000 mg | ORAL_CAPSULE | Freq: Every day | ORAL | Status: DC
  Administered 2019-01-01 – 2019-01-06 (×6): 150 mg via ORAL
  Filled 2019-01-01 (×6): qty 1

## 2019-01-01 NOTE — Plan of Care (Signed)

## 2019-01-01 NOTE — Progress Notes (Signed)
PROGRESS NOTE    Alex [name]  JJO:[id number] DOB: 06-15-1985 DOA: 12/28/2018 PCP: Patient, No Pcp Per   Brief Narrative:  Patient is a 32 year old thin Caucasian male with a past medical history significant for but not limited to alcohol abuse, polysubstance abuse, asthma and other comorbidities who presented to the hospital with chief complaint of generalized body aches and chills.  Uses IV heroin and methamphetamines and last meth use was about a week ago and heroin use was prior to ED arrival.  In the ED he is noted to be febrile on septic without unknown etiology and UDS was positive for amphetamines and opiates.  Blood cultures were positive for MRSA and started on vancomycin.  Further work-up reveals that he has a tricuspid endocarditis with severe tricuspid insufficiency.  Blood cultures have been repeated on 10/20/202 and show NGTD at 2 Days.  We will have cardiothoracic surgery evaluate for further evaluation recommendations.  Patient will need long-term antibiotics and is willing to consider starting on Suboxone for treatment.  Assessment & Plan:   Principal Problem:   Sepsis (HCC) Active Problems:   Tobacco use disorder   Polysubstance dependence (HCC)   Substance induced mood disorder (HCC)   IVDU (intravenous drug user)   Endocarditis of tricuspid valve  Sepsis without shock, likely from IV Drug Use MRSA bacteremia from Tricuspid Endocarditis  AMS in the setting of withdrawal and Bacteremia, Improved  -Mentation is greatly improved but was severely agitated and per nursing keeps getting out of the bed and yelling at the Nursing staff but was calm with me yesterday and was calm today  -Ordered Echocardiogram to rule out infective endocarditis and was normal but TEE showed Tricuspid Valve Endocarditis and Severe Tricuspid Insuffiencey  -CRP was 19.6 and LA was 1.3 -Continue IV Vancomycin-Day 4; Monitor Renal Function closely  -Repeat surveillance cultures show NGTD at 2 Days   -Leukocytosis improving and is now 10.0 -Discontinued Foley -Resumed Regular Diet and patient was on IV fluid hydration with normal saline rate of 100 mL/h but will reduce down to 50 MLS per hour for now and stop after today -ID following and Appreciate further evaluation and recommendations recommending cardiothoracic surgery evaluation I have reached out to the [facility] for formal evaluation given his tricuspid endocarditis along with severe tricuspid valve insufficiency  Hyponatremia, improved -Resolved with IVF Hydration and is now 138 -Continue to Monitor and Trend -Repeat CMP in AM   Transaminitis/Abnormal LFTs -AST was 57 and ALT was 57 and now improved as AST is 22 and ALT is 27 -Checked RUQ U/S and was read as a normal study -Continue to Monitor and Trend -Repeat CMP in AM   Polysubstance Abuse with concerns of Active Withdrawal Tobacco Use -C/w Withdrawal Protocol with IV Lorazepam 1-4 mg q1hprn and C/w IV Lorazepam 2 mg q4hprn Agitation -Continue Chlordiazepoxide 10 mg po TID D to help with his withdrawal -May consider starting Suboxone but will need a washout. -C/w Nicotine 21 mg TD Patch -C/w Folic Acid 1 mg po Daily, MVI+Minerals, and Thiamine 100 mg po Daily  -C/w Haloperidol Lactate 5 mg IV q6hprn for Agitation  History of Asthma -C/w Albuterol 2.5 mg Neg q2hprn Wheezing and SOB  Penile Lesion -STI work-up being done and RPR was Non-Reactive -HSV1 Glycoprotein was 55.40, HSV2 Glycoprotein was 1.67, HSV-2 IgG Supplemental Test was Postive and HSVI/II Comb IgM was 1.13 -Hepatitis was negative and GC/Chlaymidia is still pending currently  Underweight -Nutritionist consulted for further evaluation and recommendations -Patient  is ordering Ensure Enlive twice daily low 30 mL with Prostat twice daily and continuing to encourage p.o. intakes  Normocytic Anemia -Patient's Hb/HCt is now 10.9/32.7 -Checked Anemia Panel and showed iron level of 16, U IBC of 168, TIBC of  184, saturation ratios of 9%, ferritin level 208, folate level of 8.9, vitamin B12 level 436 -Started the patient on iron polysaccharides 150 mg p.o. daily -Continue to Monitor for S/Sx of Bleeding -Repeat CBC in AM    Tricuspid Insuffiencey  -Seen on TEE and was noted to be Severe -Dr. Royann Shivers recommended treating Endocarditis and re-evaluating with Transthoracic ECHO after completion of IV Abx -We will have Cardiothoracic surgery evaluate  Hyperglycemia in the setting of Pre-Diabetes -Initially thought to be Reactive in the setting of infection -Checked HbA1c and was 6.1 -Blood Sugars have been ranging from 89-146 on Daily BMP/CMP -Continue to Monitor Blood Sugars carefully and if necessary will place on Sensitive Novolog SSI AC  Electrolyte Abnormalities  Hypokalemia, improved -Patient's K+ this AM was 3.4 -Mag Level was 1.9 -Replete with po KCl 40 mEQ x1 -Continue to Monitor and Replete as Necessary  DVT prophylaxis: Enoxaparin 40 mg sq q24h Code Status: FULL CODE  Family Communication: No Family present at bedside  Disposition Plan: Pending further improvement and Clearance by ID  Consultants:   Cardiology for TEE  Infectious Diseases   PCCM/Pulmonary   Cardiothoraic Surgery notified    Procedures:  ECHOCARDIOGRAM 12/30/2018  IMPRESSIONS    1. Left ventricular ejection fraction, by visual estimation, is 60 to 65%. The left ventricle has normal function. Normal left ventricular size. There is no left ventricular hypertrophy.  2. Global right ventricle has normal systolic function.The right ventricular size is normal. No increase in right ventricular wall thickness.  3. Left atrial size was normal.  4. Right atrial size was normal.  5. The mitral valve is normal in structure. No evidence of mitral valve regurgitation. No evidence of mitral stenosis.  6. Thickened TV leaflet with possible mobile vegetation. ricuspid valve regurgitation is mild.  7. AV is  poorly visualized but concerning in the parasternal long axis view for possible vegetation on the ventricular side of AV. Aortic valve regurgitation was not visualized by color flow Doppler.  8. The pulmonic valve was normal in structure. Pulmonic valve regurgitation is not visualized by color flow Doppler.  9. Normal pulmonary artery systolic pressure. 10. The inferior vena cava is normal in size with greater than 50% respiratory variability, suggesting right atrial pressure of 3 mmHg. 11. Recommend TEE for further evaluation for possible vegetation on AV and TV.  FINDINGS  Left Ventricle: Left ventricular ejection fraction, by visual estimation, is 60 to 65%. The left ventricle has normal function. There is no left ventricular hypertrophy. Normal left ventricular size.  Right Ventricle: The right ventricular size is normal. No increase in right ventricular wall thickness. Global RV systolic function is has normal systolic function. The tricuspid regurgitant velocity is 2.13 m/s, and with an assumed right atrial pressure  of 3 mmHg, the estimated right ventricular systolic pressure is normal at 21.1 mmHg.  Left Atrium: Left atrial size was normal in size.  Right Atrium: Right atrial size was normal in size  Pericardium: There is no evidence of pericardial effusion.  Mitral Valve: The mitral valve is normal in structure. No evidence of mitral valve stenosis by observation. No evidence of mitral valve regurgitation.  Tricuspid Valve:Tricuspid valve regurgitation is mild by color flow Doppler. Thickened TV leaflet with  possible mobile vegetation.    Aortic Valve: The aortic valve was not well visualized. Aortic valve regurgitation was not visualized by color flow Doppler. The aortic valve is structurally normal, with no evidence of sclerosis or stenosis. AV is poorly visualized but concerning in the  parasternal long axis view for possible vegetation on the ventricular side of  AV.  Pulmonic Valve: The pulmonic valve was normal in structure. Pulmonic valve regurgitation is not visualized by color flow Doppler.  Aorta: The aortic root, ascending aorta and aortic arch are all structurally normal, with no evidence of dilitation or obstruction.  Venous: The inferior vena cava is normal in size with greater than 50% respiratory variability, suggesting right atrial pressure of 3 mmHg.  IAS/Shunts: No atrial level shunt detected by color flow Doppler. No ventricular septal defect is seen or detected. There is no evidence of an atrial septal defect.     LEFT VENTRICLE PLAX 2D LVIDd:         4.20 cm  Diastology LVIDs:         2.90 cm  LV e' lateral:   12.40 cm/s LV PW:         1.20 cm  LV E/e' lateral: 4.2 LV IVS:        1.10 cm  LV e' medial:    16.20 cm/s LVOT diam:     2.20 cm  LV E/e' medial:  3.2 LV SV:         46 ml LV SV Index:   28.28 LVOT Area:     3.80 cm    RIGHT VENTRICLE RV S prime:     14.10 cm/s TAPSE (M-mode): 2.6 cm  LEFT ATRIUM             Index LA diam:        2.80 cm 1.63 cm/m LA Vol (A2C):   23.8 ml 13.89 ml/m LA Vol (A4C):   34.9 ml 20.37 ml/m LA Biplane Vol: 31.1 ml 18.16 ml/m  AORTIC VALVE LVOT Vmax:   84.90 cm/s LVOT Vmean:  52.200 cm/s LVOT VTI:    0.126 m   AORTA Ao Asc diam: 2.90 cm  MITRAL VALVE                        TRICUSPID VALVE MV Area (PHT): 4.63 cm             TR Peak grad:   18.1 mmHg MV PHT:        47.56 msec           TR Vmax:        216.00 cm/s MV Decel Time: 164 msec MV E velocity: 52.30 cm/s 103 cm/s  SHUNTS MV A velocity: 42.00 cm/s 70.3 cm/s Systemic VTI:  0.13 m MV E/A ratio:  1.25       1.5       Systemic Diam: 2.20 cm  TEE 12/31/2018 IMPRESSIONS    1. Left ventricular ejection fraction, by visual estimation, is 60 to 65%. The left ventricle has normal function. Normal left ventricular size. There is no left ventricular hypertrophy.  2. Global right ventricle has normal systolic  function.The right ventricular size is normal. No increase in right ventricular wall thickness.  3. Left atrial size was normal.  4. Right atrial size was normal.  5. The mitral valve is normal in structure. Trace mitral valve regurgitation. No evidence of mitral stenosis.  6. Small vegetation on the tricuspid valve.  There is a probable perforation of the anterior leaflet, adjacent to the vegetation.  7. The tricuspid valve is normal in structure. Tricuspid valve regurgitation severe.  8. The aortic valve is normal in structure. Aortic valve regurgitation was not visualized by color flow Doppler. Structurally normal aortic valve, with no evidence of sclerosis or stenosis.  9. The pulmonic valve was normal in structure. Pulmonic valve regurgitation is not visualized by color flow Doppler. 10. Mildly elevated pulmonary artery systolic pressure.  FINDINGS  Left Ventricle: Left ventricular ejection fraction, by visual estimation, is 60 to 65%. The left ventricle has normal function. No evidence of left ventricular regional wall motion abnormalities. There is no left ventricular hypertrophy. Normal left  ventricular size.  Right Ventricle: The right ventricular size is normal. No increase in right ventricular wall thickness. Global RV systolic function is has normal systolic function. The tricuspid regurgitant velocity is 2.65 m/s, and with an assumed right atrial pressure  of 8 mmHg, the estimated right ventricular systolic pressure is mildly elevated at 36.1 mmHg.  Left Atrium: Left atrial size was normal in size.  Right Atrium: Right atrial size was normal in size  Pericardium: There is no evidence of pericardial effusion.  Mitral Valve: The mitral valve is normal in structure. No evidence of mitral valve stenosis by observation. Trace mitral valve regurgitation. There is no evidence of mitral valve vegetation.  Tricuspid Valve: The tricuspid valve is normal in structure. Tricuspid  valve regurgitation severe by color flow Doppler. There is a small non-mobile tricuspid valve vegetation on the anterior leaflet. The TV vegetation measures 5 mm x 7 mm. There appears  to be a 3 mm diameter perforation in the anterior leaflet.  Aortic Valve: The aortic valve is normal in structure. Aortic valve regurgitation was not visualized by color flow Doppler. The aortic valve is structurally normal, with no evidence of sclerosis or stenosis.  Pulmonic Valve: The pulmonic valve was normal in structure. Pulmonic valve regurgitation is not visualized by color flow Doppler.  Aorta: The aortic root, ascending aorta and aortic arch are all structurally normal, with no evidence of dilitation or obstruction.  Shunts: No ventricular septal defect is seen or detected. There is no evidence of an atrial septal defect. No atrial level shunt detected by color flow Doppler.    TRICUSPID VALVE             Normals TR Peak grad:   28.1 mmHg TR Vmax:        265.00 cm/s 288 cm/s  Antimicrobials:  Anti-infectives (From admission, onward)   Start     Dose/Rate Route Frequency Ordered Stop   12/31/18 1000  vancomycin (VANCOCIN) 1,250 mg in sodium chloride 0.9 % 250 mL IVPB     1,250 mg 166.7 mL/hr over 90 Minutes Intravenous Every 12 hours 12/31/18 0929     12/28/18 1800  vancomycin (VANCOCIN) IVPB 1000 mg/200 mL premix  Status:  Discontinued     1,000 mg 200 mL/hr over 60 Minutes Intravenous 2 times daily 12/28/18 0914 12/31/18 0929   12/28/18 1400  ceFEPIme (MAXIPIME) 2 g in sodium chloride 0.9 % 100 mL IVPB  Status:  Discontinued     2 g 200 mL/hr over 30 Minutes Intravenous Every 8 hours 12/28/18 0914 12/29/18 1542   12/28/18 0730  vancomycin (VANCOCIN) 1,250 mg in sodium chloride 0.9 % 250 mL IVPB     1,250 mg 166.7 mL/hr over 90 Minutes Intravenous STAT 12/28/18 0715 12/28/18 0935   12/28/18 0730  ceFEPIme (MAXIPIME) 2 g in sodium chloride 0.9 % 100 mL IVPB     2 g 200 mL/hr over 30  Minutes Intravenous STAT 12/28/18 0715 12/28/18 0833     Subjective: Seen and examined at bedside and he was resting but states that he had some back pain and stated that it was uncomfortable.  No nausea or vomiting.  No chest pain, lightheadedness or dizziness.  No other concerns reported at this time and wanting to know how long he would need IV antibiotic therapy for patient  Objective: Vitals:   12/31/18 2031 12/31/18 2053 01/01/19 0425 01/01/19 1232  BP: 123/85  115/77 129/90  Pulse: 90  79 84  Resp: Temp: 98 F (36.7 C)  97.7 F (36.5 C) 97.7 F (36.5 C)  TempSrc: Oral  Oral Axillary  SpO2: 99% 98% 99% 99%  Weight:      Height:        Intake/Output Summary (Last 24 hours) at 01/01/2019 1239 Last data filed at 01/01/2019 1100 Gross per 24 hour  Intake 2308.09 ml  Output 3350 ml  Net -1041.91 ml   Filed Weights   12/28/18 0040 12/28/18 1254  Weight: 56.7 kg 54.3 kg   Examination: Physical Exam:  Constitutional: Thin Caucasian male in NAD who appears calm Eyes: Lids and conjunctivae normal, sclerae anicteric  ENMT: External Ears, Nose appear normal. Grossly normal hearing.  Neck: Appears normal, supple, no cervical masses, normal ROM, no appreciable thyromegaly; no JVD Respiratory: Diminished to auscultation bilaterally, no wheezing, rales, rhonchi or crackles. Normal respiratory effort and patient is not tachypenic. No accessory muscle use.  Cardiovascular: RRR, no murmurs / rubs / gallops. S1 and S2 auscultated. No extremity edema.  Abdomen: Soft, non-tender, non-distended. Bowel sounds positive x4.  GU: Deferred. Musculoskeletal: No clubbing / cyanosis of digits/nails. No joint deformity upper and lower extremities.  Skin: No rashes, lesions, ulcers but has some facial lesions on a limited skin evaluation. No induration; Warm and dry.  Neurologic: CN 2-12 grossly intact with no focal deficits. Romberg sign and cerebellar reflexes not assessed.    Psychiatric: Normal judgment and insight. Alert and oriented x 3. Normal mood and appropriate affect.     Data Reviewed: I have personally reviewed following labs and imaging studies  CBC: Recent Labs  Lab 12/28/18 0630 12/29/18 0533 12/31/18 0439 01/01/19 0342  WBC 13.3* 14.8* 10.2 10.0  NEUTROABS 11.1*  --   --  6.9  HGB 11.8* 12.2* 10.9* 9.8*  HCT 35.7* 35.5* 32.7* 29.8*  MCV 89.0 85.5 87.9 88.4  PLT 208 178 239 265   Basic Metabolic Panel: Recent Labs  Lab 12/28/18 0630 12/29/18 0609 12/29/18 0920 12/31/18 0439 01/01/19 0342  NA 130* 139  --  139 138  K 3.9 3.7  --  3.1* 3.5  CL 95* 110  --  108 106  CO2 22 18*  --  23 24  GLUCOSE 110* 89  --  146* 125*  BUN 8 9  --  7 5*  CREATININE 0.70 0.62  --  0.50* 0.51*  CALCIUM 8.7* 8.4*  --  7.9* 8.2*  MG  --   --  1.9 1.9 1.9  PHOS  --   --  3.9  --  3.3   GFR: Estimated Creatinine Clearance: 100.9 mL/min (A) (by C-G formula based on SCr of 0.51 mg/dL (L)). Liver Function Tests: Recent Labs  Lab 12/28/18 0630 12/29/18 0609 01/01/19 0342  AST 57* 26 22  ALT 57* 36 27  ALKPHOS 231* 166* 151*  BILITOT 0.8 0.7 0.4  PROT 7.0 6.0* 5.9*  ALBUMIN 2.9* 2.5* 2.2*   No results for input(s): LIPASE, AMYLASE in the last 168 hours. No results for input(s): AMMONIA in the last 168 hours. Coagulation Profile: Recent Labs  Lab 12/28/18 0747  INR 1.2   Cardiac Enzymes: Recent Labs  Lab 12/28/18 0630  CKTOTAL 52   BNP (last 3 results) No results for input(s): PROBNP in the last 8760 hours. HbA1C: Recent Labs    01/01/19 0342  HGBA1C 6.1*   CBG: Recent Labs  Lab 12/31/18 0632 12/31/18 1512 01/01/19 0001 01/01/19 0613 01/01/19 1229  GLUCAP 115* 96 107* 80 101*   Lipid Profile: No results for input(s): CHOL, HDL, LDLCALC, TRIG, CHOLHDL, LDLDIRECT in the last 72 hours. Thyroid Function Tests: No results for input(s): TSH, T4TOTAL, FREET4, T3FREE, THYROIDAB in the last 72 hours. Anemia Panel: Recent  Labs    01/01/19 0342  VITAMINB12 436  FOLATE 8.9  FERRITIN 208  TIBC 184*  IRON 16*  RETICCTPCT 0.4   Sepsis Labs: Recent Labs  Lab 12/28/18 0747 12/28/18 1026  LATICACIDVEN 1.0 1.3    Recent Results (from the past 240 hour(s))  SARS CORONAVIRUS 2 (TAT 6-24 HRS) Nasopharyngeal Nasopharyngeal Swab     Status: None   Collection Time: 12/28/18  5:37 AM   Specimen: Nasopharyngeal Swab  Result Value Ref Range Status   SARS Coronavirus 2 NEGATIVE NEGATIVE Final    Comment: (NOTE) SARS-CoV-2 target nucleic acids are NOT DETECTED. The SARS-CoV-2 RNA is generally detectable in upper and lower respiratory specimens during the acute phase of infection. Negative results do not preclude SARS-CoV-2 infection, do not rule out co-infections with other pathogens, and should not be used as the sole basis for treatment or other patient management decisions. Negative results must be combined with clinical observations, patient history, and epidemiological information. The expected result is Negative. Fact Sheet for Patients: HairSlick.no Fact Sheet for Healthcare Providers: quierodirigir.com This test is not yet approved or cleared by the [place] FDA and  has been authorized for detection and/or diagnosis of SARS-CoV-2 by FDA under an Emergency Use Authorization (EUA). This EUA will remain  in effect (meaning this test can be used) for the duration of the COVID-19 declaration under Section 56 4(b)(1) of the Act, 21 U.S.C. section 360bbb-3(b)(1), unless the authorization is terminated or revoked sooner. Performed at [facility], [address] [place], [place] [address]   Blood culture (routine x 2)     Status: Abnormal   Collection Time: 12/28/18  7:47 AM   Specimen: BLOOD RIGHT ARM  Result Value Ref Range Status   Specimen Description   Final    BLOOD RIGHT ARM Performed at [facility], [address] [place], [place] [address]    Special Requests   Final    BOTTLES DRAWN AEROBIC AND ANAEROBIC Blood Culture results may not be optimal due to an excessive volume of blood received in culture bottles Performed at [facility], [address], [place], [place] [address]    Culture  Setup Time   Final    GRAM POSITIVE COCCI IN BOTH AEROBIC AND ANAEROBIC BOTTLES CRITICAL RESULT CALLED TO, READ BACK BY AND VERIFIED WITH: PHARMD JUSTIN L 1948 [id number] FCP CRITICAL RESULT CALLED TO, READ BACK BY AND VERIFIED WITH: B GREEN,PHARMD AT 2206 12/28/2018 BY L BENFIELD Performed at [facility], [address] [place], [place] [address]    Culture METHICILLIN RESISTANT STAPHYLOCOCCUS AUREUS (A)  Final   Report Status 12/30/2018 FINAL  Final   Organism ID, Bacteria METHICILLIN RESISTANT STAPHYLOCOCCUS AUREUS  Final      Susceptibility   Methicillin resistant staphylococcus aureus - MIC*    CIPROFLOXACIN >=8 RESISTANT Resistant     ERYTHROMYCIN >=8 RESISTANT Resistant     GENTAMICIN <=0.5 SENSITIVE Sensitive     OXACILLIN >=4 RESISTANT Resistant     TETRACYCLINE <=1 SENSITIVE Sensitive     VANCOMYCIN <=0.5 SENSITIVE Sensitive     TRIMETH/SULFA <=10 SENSITIVE Sensitive     CLINDAMYCIN <=0.25 SENSITIVE Sensitive     RIFAMPIN <=0.5 SENSITIVE Sensitive     Inducible Clindamycin NEGATIVE Sensitive     * METHICILLIN RESISTANT STAPHYLOCOCCUS AUREUS  Blood culture (routine x 2)     Status: Abnormal   Collection Time: 12/28/18  7:47 AM   Specimen: BLOOD  Result Value Ref Range Status   Specimen Description   Final    BLOOD RIGHT WRIST Performed at [facility], [address] [place], [place] [address]    Special Requests   Final    BOTTLES DRAWN AEROBIC AND ANAEROBIC Blood Culture results may not be optimal due to an inadequate volume of blood received in culture bottles Performed at [facility], [address], [place], [place] [address]     Culture  Setup Time   Final    GRAM POSITIVE COCCI IN CLUSTERS IN BOTH AEROBIC AND ANAEROBIC BOTTLES CRITICAL VALUE NOTED.  VALUE IS CONSISTENT WITH PREVIOUSLY REPORTED AND CALLED VALUE.    Culture (A)  Final    STAPHYLOCOCCUS AUREUS SUSCEPTIBILITIES PERFORMED ON PREVIOUS CULTURE WITHIN THE LAST 5 DAYS. Performed at [facility], [address], [place], [place] [address]    Report Status 12/30/2018 FINAL  Final  Urine culture     Status: Abnormal   Collection Time: 12/28/18  7:47 AM   Specimen: In/Out Cath Urine  Result Value Ref Range Status   Specimen Description   Final    IN/OUT CATH URINE Performed at [facility], [address] [place], [place] [address]    Special Requests   Final    NONE Performed at [facility], [address] [place], [place] [address]    Culture (A)  Final    30,000 COLONIES/mL METHICILLIN RESISTANT STAPHYLOCOCCUS AUREUS   Report Status 12/30/2018 FINAL  Final   Organism ID, Bacteria METHICILLIN RESISTANT STAPHYLOCOCCUS AUREUS (A)  Final      Susceptibility   Methicillin resistant staphylococcus aureus - MIC*    CIPROFLOXACIN >=8 RESISTANT Resistant     GENTAMICIN <=0.5 SENSITIVE Sensitive     NITROFURANTOIN <=16 SENSITIVE Sensitive     OXACILLIN >=4 RESISTANT Resistant     TETRACYCLINE <=1 SENSITIVE Sensitive     VANCOMYCIN 1 SENSITIVE Sensitive     TRIMETH/SULFA <=10 SENSITIVE Sensitive     CLINDAMYCIN <=0.25 SENSITIVE Sensitive     RIFAMPIN <=0.5 SENSITIVE Sensitive     Inducible Clindamycin NEGATIVE Sensitive     * 30,000 COLONIES/mL METHICILLIN RESISTANT STAPHYLOCOCCUS AUREUS  Blood Culture ID Panel (Reflexed)     Status: Abnormal   Collection Time: 12/28/18  7:47 AM  Result Value Ref Range Status   Enterococcus species NOT DETECTED NOT DETECTED Final   Listeria monocytogenes NOT DETECTED NOT DETECTED Final   Staphylococcus species DETECTED (A) NOT DETECTED Final    Comment: CRITICAL  RESULT CALLED TO, READ BACK BY AND VERIFIED WITH: B GREEN,PHARMD AT 2206 12/28/2018 BY L BENFIELD    Staphylococcus aureus (BCID) DETECTED (A) NOT DETECTED Final    Comment: Methicillin (oxacillin)-resistant Staphylococcus aureus (MRSA). MRSA is predictably resistant to beta-lactam antibiotics (except ceftaroline). Preferred therapy is vancomycin unless clinically contraindicated. Patient requires contact precautions if  hospitalized. CRITICAL RESULT CALLED TO, READ BACK BY AND VERIFIED WITH: B GREEN,PHARMD AT 2206 12/28/2018 BY L BENFIELD    Methicillin resistance DETECTED (A) NOT DETECTED Final    Comment: CRITICAL RESULT CALLED TO, READ BACK BY AND VERIFIED WITH: B GREEN,PHARMD AT 2206 12/28/2018 BY L BENFIELD    Streptococcus species NOT DETECTED NOT DETECTED Final   Streptococcus agalactiae NOT DETECTED NOT DETECTED Final   Streptococcus pneumoniae NOT DETECTED NOT DETECTED Final   Streptococcus pyogenes NOT DETECTED NOT DETECTED Final   Acinetobacter baumannii NOT DETECTED NOT DETECTED Final   Enterobacteriaceae species NOT DETECTED NOT DETECTED Final   Enterobacter cloacae complex NOT DETECTED NOT DETECTED Final   Escherichia coli NOT DETECTED NOT DETECTED Final   Klebsiella oxytoca NOT DETECTED NOT DETECTED Final   Klebsiella pneumoniae NOT DETECTED NOT DETECTED Final   Proteus species NOT DETECTED NOT DETECTED Final   Serratia marcescens NOT DETECTED NOT DETECTED Final   Haemophilus influenzae NOT DETECTED NOT DETECTED Final   Neisseria meningitidis NOT DETECTED NOT DETECTED Final   Pseudomonas aeruginosa NOT DETECTED NOT DETECTED Final   Candida albicans NOT DETECTED NOT DETECTED Final   Candida glabrata NOT DETECTED NOT DETECTED Final   Candida krusei NOT DETECTED NOT DETECTED Final   Candida parapsilosis NOT DETECTED NOT DETECTED Final   Candida tropicalis NOT DETECTED NOT DETECTED Final    Comment: Performed at [facility], [address] [place], [place]  [id number]  MRSA PCR Screening     Status: Abnormal   Collection Time: 12/28/18 12:51 PM   Specimen: Nasal Mucosa; Nasopharyngeal  Result Value Ref Range Status   MRSA by PCR POSITIVE (A) NEGATIVE Final    Comment:        The GeneXpert MRSA Assay (FDA approved for NASAL specimens only), is one component of a comprehensive MRSA colonization surveillance program. It is not intended to diagnose MRSA infection nor to guide or monitor treatment for MRSA infections. RESULT CALLED TO, READ BACK BY AND VERIFIED WITH: K ZULETA,RN 12/28/18 1507 RHOLMES Performed at [facility], [address] [place], [place] [id number]   Respiratory Panel by PCR     Status: None   Collection Time: 12/28/18  1:59 PM   Specimen: Nasopharyngeal Swab; Respiratory  Result Value Ref Range Status   Adenovirus NOT DETECTED NOT DETECTED Final   Coronavirus 229E NOT DETECTED NOT DETECTED Final    Comment: (NOTE) The Coronavirus on the Respiratory Panel, DOES NOT test for the novel  Coronavirus (2019 nCoV)    Coronavirus HKU1 NOT DETECTED NOT DETECTED Final   Coronavirus NL63 NOT DETECTED NOT DETECTED Final   Coronavirus OC43 NOT DETECTED NOT DETECTED Final   Metapneumovirus NOT DETECTED NOT DETECTED Final   Rhinovirus / Enterovirus NOT DETECTED NOT DETECTED Final   Influenza A NOT DETECTED NOT DETECTED Final   Influenza B NOT DETECTED NOT DETECTED Final   Parainfluenza Virus 1 NOT DETECTED NOT DETECTED Final   Parainfluenza Virus 2 NOT DETECTED NOT DETECTED Final   Parainfluenza Virus 3 NOT DETECTED NOT DETECTED Final   Parainfluenza Virus 4 NOT DETECTED NOT DETECTED Final   Respiratory  Syncytial Virus NOT DETECTED NOT DETECTED Final   Bordetella pertussis NOT DETECTED NOT DETECTED Final   Chlamydophila pneumoniae NOT DETECTED NOT DETECTED Final   Mycoplasma pneumoniae NOT DETECTED NOT DETECTED Final    Comment: Performed at [facility], [address] [place], [place] [address]    SARS Coronavirus 2 by RT PCR (hospital order, performed in [facility] hospital lab) Nasopharyngeal Nasopharyngeal Swab     Status: None   Collection Time: 12/28/18  1:59 PM   Specimen: Nasopharyngeal Swab  Result Value Ref Range Status   SARS Coronavirus 2 NEGATIVE NEGATIVE Final    Comment: (NOTE) If result is NEGATIVE SARS-CoV-2 target nucleic acids are NOT DETECTED. The SARS-CoV-2 RNA is generally detectable in upper and lower  respiratory specimens during the acute phase of infection. The lowest  concentration of SARS-CoV-2 viral copies this assay can detect is 250  copies / mL. A negative result does not preclude SARS-CoV-2 infection  and should not be used as the sole basis for treatment or other  patient management decisions.  A negative result may occur with  improper specimen collection / handling, submission of specimen other  than nasopharyngeal swab, presence of viral mutation(s) within the  areas targeted by this assay, and inadequate number of viral copies  (<250 copies / mL). A negative result must be combined with clinical  observations, patient history, and epidemiological information. If result is POSITIVE SARS-CoV-2 target nucleic acids are DETECTED. The SARS-CoV-2 RNA is generally detectable in upper and lower  respiratory specimens dur ing the acute phase of infection.  Positive  results are indicative of active infection with SARS-CoV-2.  Clinical  correlation with patient history and other diagnostic information is  necessary to determine patient infection status.  Positive results do  not rule out bacterial infection or co-infection with other viruses. If result is PRESUMPTIVE POSTIVE SARS-CoV-2 nucleic acids MAY BE PRESENT.   A presumptive positive result was obtained on the submitted specimen  and confirmed on repeat testing.  While 2019 novel coronavirus  (SARS-CoV-2) nucleic acids may be present in the submitted sample  additional confirmatory testing  may be necessary for epidemiological  and / or clinical management purposes  to differentiate between  SARS-CoV-2 and other Sarbecovirus currently known to infect humans.  If clinically indicated additional testing with an alternate test  methodology [phone]) is advised. The SARS-CoV-2 RNA is generally  detectable in upper and lower respiratory sp ecimens during the acute  phase of infection. The expected result is Negative. Fact Sheet for Patients:  BoilerBrush.com.cy Fact Sheet for Healthcare Providers: https://pope.com/ This test is not yet approved or cleared by the [place] FDA and has been authorized for detection and/or diagnosis of SARS-CoV-2 by FDA under an Emergency Use Authorization (EUA).  This EUA will remain in effect (meaning this test can be used) for the duration of the COVID-19 declaration under Section 564(b)(1) of the Act, 21 U.S.C. section 360bbb-3(b)(1), unless the authorization is terminated or revoked sooner. Performed at [facility], [address], [place], [place] [address]   Culture, blood (Routine X 2) w Reflex to ID Panel     Status: None (Preliminary result)   Collection Time: 12/30/18  8:33 AM   Specimen: BLOOD RIGHT HAND  Result Value Ref Range Status   Specimen Description   Final    BLOOD RIGHT HAND Performed at [facility], [address], [place], [place] [address]    Special Requests  Final    BOTTLES DRAWN AEROBIC ONLY Blood Culture adequate volume Performed at [facility] [address], [place], [place] [address]    Culture   Final    NO GROWTH 2 DAYS Performed at [facility] [address] [place], [place] [address]    Report Status PENDING  Incomplete    RN Pressure Injury Documentation:     Estimated body mass index is 16.24 kg/m as calculated from the following:   Height as of this encounter: 6' (1.829  m).   Weight as of this encounter: 54.3 kg.  Malnutrition Type:  Nutrition Problem: Increased nutrient needs Etiology: acute illness(sepsis)   Malnutrition Characteristics:  Signs/Symptoms: estimated needs   Nutrition Interventions:  Interventions: MVI, Prostat, Ensure Enlive (each supplement provides 350kcal and 20 grams of protein)   Radiology Studies: No results found.   Scheduled Meds:  chlordiazePOXIDE  10 mg Oral TID   Chlorhexidine Gluconate Cloth  6 each Topical Q0600   enoxaparin (LOVENOX) injection  40 mg Subcutaneous Q24H   feeding supplement (ENSURE ENLIVE)  237 mL Oral BID BM   feeding supplement (PRO-STAT SUGAR FREE 64)  30 mL Oral BID   folic acid  1 mg Oral Daily   iron polysaccharides  150 mg Oral Daily   mouth rinse  15 mL Mouth Rinse BID   multivitamin with minerals  1 tablet Oral Daily   mupirocin ointment  1 application Nasal BID   nicotine  21 mg Transdermal Daily   sodium chloride flush  10-40 mL Intracatheter Q12H   thiamine  100 mg Oral Daily   Or   thiamine  100 mg Intravenous Daily   Continuous Infusions:  sodium chloride 100 mL/hr at 01/01/19 0927   vancomycin 1,250 mg (01/01/19 0928)    LOS: 4 days   Kerney Elbe, DO [facility] PAGER is on AMION  If 7PM-7AM, please contact night-coverage [email or web address] Password [facility] 01/01/2019, 12:39 PM

## 2019-01-01 NOTE — Progress Notes (Signed)
INFECTIOUS DISEASE PROGRESS NOTE  ID: Alex [name] is a 33 y.o. male with  Principal Problem:   Sepsis ([place]) Active Problems:   Tobacco use disorder   Polysubstance dependence ([place])   Substance induced mood disorder ([place])   IVDU (intravenous drug user)   Endocarditis of tricuspid valve  Subjective: No complaints.   Abtx:  Anti-infectives (From admission, onward)   Start     Dose/Rate Route Frequency Ordered Stop   12/31/18 1000  vancomycin (VANCOCIN) 1,250 mg in sodium chloride 0.9 % 250 mL IVPB     1,250 mg 166.7 mL/hr over 90 Minutes Intravenous Every 12 hours 12/31/18 0929     12/28/18 1800  vancomycin (VANCOCIN) IVPB 1000 mg/200 mL premix  Status:  Discontinued     1,000 mg 200 mL/hr over 60 Minutes Intravenous 2 times daily 12/28/18 0914 12/31/18 0929   12/28/18 1400  ceFEPIme (MAXIPIME) 2 g in sodium chloride 0.9 % 100 mL IVPB  Status:  Discontinued     2 g 200 mL/hr over 30 Minutes Intravenous Every 8 hours 12/28/18 0914 12/29/18 1542   12/28/18 0730  vancomycin (VANCOCIN) 1,250 mg in sodium chloride 0.9 % 250 mL IVPB     1,250 mg 166.7 mL/hr over 90 Minutes Intravenous STAT 12/28/18 0715 12/28/18 0935   12/28/18 0730  ceFEPIme (MAXIPIME) 2 g in sodium chloride 0.9 % 100 mL IVPB     2 g 200 mL/hr over 30 Minutes Intravenous STAT 12/28/18 0715 12/28/18 0833      Medications:  Scheduled: . chlordiazePOXIDE  10 mg Oral TID  . Chlorhexidine Gluconate Cloth  6 each Topical Q0600  . enoxaparin (LOVENOX) injection  40 mg Subcutaneous Q24H  . feeding supplement (ENSURE ENLIVE)  237 mL Oral BID BM  . feeding supplement (PRO-STAT SUGAR FREE 64)  30 mL Oral BID  . folic acid  1 mg Oral Daily  . iron polysaccharides  150 mg Oral Daily  . mouth rinse  15 mL Mouth Rinse BID  . multivitamin with minerals  1 tablet Oral Daily  . mupirocin ointment  1 application Nasal BID  . nicotine  21 mg Transdermal Daily  . sodium chloride flush  10-40 mL Intracatheter Q12H  .  thiamine  100 mg Oral Daily   Or  . thiamine  100 mg Intravenous Daily    Objective: Vital signs in last 24 hours: Temp:  [97.7 F (36.5 C)-98.5 F (36.9 C)] 97.7 F (36.5 C) (10/22 0425) Pulse Rate:  [79-90] 79 (10/22 0425) Resp:  [17-24] 20 (10/22 0425) BP: (96-123)/(69-85) 115/77 (10/22 0425) SpO2:  [97 %-100 %] 99 % (10/22 0425)   General appearance: alert, cooperative and no distress Resp: clear to auscultation bilaterally Cardio: regular rate and rhythm GI: normal findings: bowel sounds normal and soft, non-tender Extremities: edema none  Lab Results Recent Labs    12/31/18 0439 01/01/19 0342  WBC 10.2 10.0  HGB 10.9* 9.8*  HCT 32.7* 29.8*  NA 139 138  K 3.1* 3.5  CL 108 106  CO2 23 24  BUN 7 5*  CREATININE 0.50* 0.51*   Liver Panel Recent Labs    01/01/19 0342  PROT 5.9*  ALBUMIN 2.2*  AST 22  ALT 27  ALKPHOS 151*  BILITOT 0.4   Sedimentation Rate No results for input(s): ESRSEDRATE in the last 72 hours. C-Reactive Protein No results for input(s): CRP in the last 72 hours.  Microbiology: Recent Results (from the past 240 hour(s))  SARS CORONAVIRUS  2 (TAT 6-24 HRS) Nasopharyngeal Nasopharyngeal Swab     Status: None   Collection Time: 12/28/18  5:37 AM   Specimen: Nasopharyngeal Swab  Result Value Ref Range Status   SARS Coronavirus 2 NEGATIVE NEGATIVE Final    Comment: (NOTE) SARS-CoV-2 target nucleic acids are NOT DETECTED. The SARS-CoV-2 RNA is generally detectable in upper and lower respiratory specimens during the acute phase of infection. Negative results do not preclude SARS-CoV-2 infection, do not rule out co-infections with other pathogens, and should not be used as the sole basis for treatment or other patient management decisions. Negative results must be combined with clinical observations, patient history, and epidemiological information. The expected result is Negative. Fact Sheet for Patients:  HairSlick.no Fact Sheet for Healthcare Providers: quierodirigir.com This test is not yet approved or cleared by the [place] FDA and  has been authorized for detection and/or diagnosis of SARS-CoV-2 by FDA under an Emergency Use Authorization (EUA). This EUA will remain  in effect (meaning this test can be used) for the duration of the COVID-19 declaration under Section 56 4(b)(1) of the Act, 21 U.S.C. section 360bbb-3(b)(1), unless the authorization is terminated or revoked sooner. Performed at [facility], [address], [place], [place] [address]   Blood culture (routine x 2)     Status: Abnormal   Collection Time: 12/28/18  7:47 AM   Specimen: BLOOD RIGHT ARM  Result Value Ref Range Status   Specimen Description   Final    BLOOD RIGHT ARM Performed at [facility], [address] [place], [place] [address]    Special Requests   Final    BOTTLES DRAWN AEROBIC AND ANAEROBIC Blood Culture results may not be optimal due to an excessive volume of blood received in culture bottles Performed at [facility], [address], [place], [place] [address]    Culture  Setup Time   Final    GRAM POSITIVE COCCI IN BOTH AEROBIC AND ANAEROBIC BOTTLES CRITICAL RESULT CALLED TO, READ BACK BY AND VERIFIED WITH: PHARMD JUSTIN L 1948 [id number] FCP CRITICAL RESULT CALLED TO, READ BACK BY AND VERIFIED WITH: B GREEN,PHARMD AT 2206 12/28/2018 BY L BENFIELD Performed at [facility], [address] [place], [place] [address]    Culture METHICILLIN RESISTANT STAPHYLOCOCCUS AUREUS (A)  Final   Report Status 12/30/2018 FINAL  Final   Organism ID, Bacteria METHICILLIN RESISTANT STAPHYLOCOCCUS AUREUS  Final      Susceptibility   Methicillin resistant staphylococcus aureus - MIC*    CIPROFLOXACIN >=8 RESISTANT Resistant     ERYTHROMYCIN >=8 RESISTANT Resistant     GENTAMICIN <=0.5 SENSITIVE Sensitive      OXACILLIN >=4 RESISTANT Resistant     TETRACYCLINE <=1 SENSITIVE Sensitive     VANCOMYCIN <=0.5 SENSITIVE Sensitive     TRIMETH/SULFA <=10 SENSITIVE Sensitive     CLINDAMYCIN <=0.25 SENSITIVE Sensitive     RIFAMPIN <=0.5 SENSITIVE Sensitive     Inducible Clindamycin NEGATIVE Sensitive     * METHICILLIN RESISTANT STAPHYLOCOCCUS AUREUS  Blood culture (routine x 2)     Status: Abnormal   Collection Time: 12/28/18  7:47 AM   Specimen: BLOOD  Result Value Ref Range Status   Specimen Description   Final    BLOOD RIGHT WRIST Performed at [facility], [address] [place], [place] [address]    Special Requests   Final    BOTTLES DRAWN AEROBIC AND ANAEROBIC Blood Culture results may not  be optimal due to an inadequate volume of blood received in culture bottles Performed at [facility], [address] [place], [place] [address]    Culture  Setup Time   Final    GRAM POSITIVE COCCI IN CLUSTERS IN BOTH AEROBIC AND ANAEROBIC BOTTLES CRITICAL VALUE NOTED.  VALUE IS CONSISTENT WITH PREVIOUSLY REPORTED AND CALLED VALUE.    Culture (A)  Final    STAPHYLOCOCCUS AUREUS SUSCEPTIBILITIES PERFORMED ON PREVIOUS CULTURE WITHIN THE LAST 5 DAYS. Performed at [facility], [address] [place], [place] [address]    Report Status 12/30/2018 FINAL  Final  Urine culture     Status: Abnormal   Collection Time: 12/28/18  7:47 AM   Specimen: In/Out Cath Urine  Result Value Ref Range Status   Specimen Description   Final    IN/OUT CATH URINE Performed at [facility], [address] [place], [place] [address]    Special Requests   Final    NONE Performed at [facility], [address] [place], [place] [address]    Culture (A)  Final    30,000 COLONIES/mL METHICILLIN RESISTANT STAPHYLOCOCCUS AUREUS   Report Status 12/30/2018 FINAL  Final   Organism ID, Bacteria METHICILLIN RESISTANT STAPHYLOCOCCUS AUREUS (A)   Final      Susceptibility   Methicillin resistant staphylococcus aureus - MIC*    CIPROFLOXACIN >=8 RESISTANT Resistant     GENTAMICIN <=0.5 SENSITIVE Sensitive     NITROFURANTOIN <=16 SENSITIVE Sensitive     OXACILLIN >=4 RESISTANT Resistant     TETRACYCLINE <=1 SENSITIVE Sensitive     VANCOMYCIN 1 SENSITIVE Sensitive     TRIMETH/SULFA <=10 SENSITIVE Sensitive     CLINDAMYCIN <=0.25 SENSITIVE Sensitive     RIFAMPIN <=0.5 SENSITIVE Sensitive     Inducible Clindamycin NEGATIVE Sensitive     * 30,000 COLONIES/mL METHICILLIN RESISTANT STAPHYLOCOCCUS AUREUS  Blood Culture ID Panel (Reflexed)     Status: Abnormal   Collection Time: 12/28/18  7:47 AM  Result Value Ref Range Status   Enterococcus species NOT DETECTED NOT DETECTED Final   Listeria monocytogenes NOT DETECTED NOT DETECTED Final   Staphylococcus species DETECTED (A) NOT DETECTED Final    Comment: CRITICAL RESULT CALLED TO, READ BACK BY AND VERIFIED WITH: B GREEN,PHARMD AT 2206 12/28/2018 BY L BENFIELD    Staphylococcus aureus (BCID) DETECTED (A) NOT DETECTED Final    Comment: Methicillin (oxacillin)-resistant Staphylococcus aureus (MRSA). MRSA is predictably resistant to beta-lactam antibiotics (except ceftaroline). Preferred therapy is vancomycin unless clinically contraindicated. Patient requires contact precautions if  hospitalized. CRITICAL RESULT CALLED TO, READ BACK BY AND VERIFIED WITH: B GREEN,PHARMD AT 2206 12/28/2018 BY L BENFIELD    Methicillin resistance DETECTED (A) NOT DETECTED Final    Comment: CRITICAL RESULT CALLED TO, READ BACK BY AND VERIFIED WITH: B GREEN,PHARMD AT 2206 12/28/2018 BY L BENFIELD    Streptococcus species NOT DETECTED NOT DETECTED Final   Streptococcus agalactiae NOT DETECTED NOT DETECTED Final   Streptococcus pneumoniae NOT DETECTED NOT DETECTED Final   Streptococcus pyogenes NOT DETECTED NOT DETECTED Final   Acinetobacter baumannii NOT DETECTED NOT DETECTED Final   Enterobacteriaceae  species NOT DETECTED NOT DETECTED Final   Enterobacter cloacae complex NOT DETECTED NOT DETECTED Final   Escherichia coli NOT DETECTED NOT DETECTED Final   Klebsiella oxytoca NOT DETECTED NOT DETECTED Final   Klebsiella pneumoniae NOT DETECTED NOT DETECTED Final   Proteus species NOT DETECTED NOT DETECTED Final  Serratia marcescens NOT DETECTED NOT DETECTED Final   Haemophilus influenzae NOT DETECTED NOT DETECTED Final   Neisseria meningitidis NOT DETECTED NOT DETECTED Final   Pseudomonas aeruginosa NOT DETECTED NOT DETECTED Final   Candida albicans NOT DETECTED NOT DETECTED Final   Candida glabrata NOT DETECTED NOT DETECTED Final   Candida krusei NOT DETECTED NOT DETECTED Final   Candida parapsilosis NOT DETECTED NOT DETECTED Final   Candida tropicalis NOT DETECTED NOT DETECTED Final    Comment: Performed at [facility], [address], [place], [place] [address]  MRSA PCR Screening     Status: Abnormal   Collection Time: 12/28/18 12:51 PM   Specimen: Nasal Mucosa; Nasopharyngeal  Result Value Ref Range Status   MRSA by PCR POSITIVE (A) NEGATIVE Final    Comment:        The GeneXpert MRSA Assay (FDA approved for NASAL specimens only), is one component of a comprehensive MRSA colonization surveillance program. It is not intended to diagnose MRSA infection nor to guide or monitor treatment for MRSA infections. RESULT CALLED TO, READ BACK BY AND VERIFIED WITH: K ZULETA,RN 12/28/18 1507 RHOLMES Performed at [facility], [address] [place], [place] [address]   Respiratory Panel by PCR     Status: None   Collection Time: 12/28/18  1:59 PM   Specimen: Nasopharyngeal Swab; Respiratory  Result Value Ref Range Status   Adenovirus NOT DETECTED NOT DETECTED Final   Coronavirus 229E NOT DETECTED NOT DETECTED Final    Comment: (NOTE) The Coronavirus on the Respiratory Panel, DOES NOT test for the novel  Coronavirus (2019 nCoV)    Coronavirus HKU1 NOT  DETECTED NOT DETECTED Final   Coronavirus NL63 NOT DETECTED NOT DETECTED Final   Coronavirus OC43 NOT DETECTED NOT DETECTED Final   Metapneumovirus NOT DETECTED NOT DETECTED Final   Rhinovirus / Enterovirus NOT DETECTED NOT DETECTED Final   Influenza A NOT DETECTED NOT DETECTED Final   Influenza B NOT DETECTED NOT DETECTED Final   Parainfluenza Virus 1 NOT DETECTED NOT DETECTED Final   Parainfluenza Virus 2 NOT DETECTED NOT DETECTED Final   Parainfluenza Virus 3 NOT DETECTED NOT DETECTED Final   Parainfluenza Virus 4 NOT DETECTED NOT DETECTED Final   Respiratory Syncytial Virus NOT DETECTED NOT DETECTED Final   Bordetella pertussis NOT DETECTED NOT DETECTED Final   Chlamydophila pneumoniae NOT DETECTED NOT DETECTED Final   Mycoplasma pneumoniae NOT DETECTED NOT DETECTED Final    Comment: Performed at [facility], [address] [place], [place] [address]  SARS Coronavirus 2 by RT PCR (hospital order, performed in [facility] hospital lab) Nasopharyngeal Nasopharyngeal Swab     Status: None   Collection Time: 12/28/18  1:59 PM   Specimen: Nasopharyngeal Swab  Result Value Ref Range Status   SARS Coronavirus 2 NEGATIVE NEGATIVE Final    Comment: (NOTE) If result is NEGATIVE SARS-CoV-2 target nucleic acids are NOT DETECTED. The SARS-CoV-2 RNA is generally detectable in upper and lower  respiratory specimens during the acute phase of infection. The lowest  concentration of SARS-CoV-2 viral copies this assay can detect is 250  copies / mL. A negative result does not preclude SARS-CoV-2 infection  and should not be used as the sole basis for treatment or other  patient management decisions.  A negative result may occur with  improper specimen collection / handling, submission of specimen other  than nasopharyngeal swab, presence of viral mutation(s) within the  areas targeted by this  assay, and inadequate number of viral copies  (<250 copies / mL). A negative result must be  combined with clinical  observations, patient history, and epidemiological information. If result is POSITIVE SARS-CoV-2 target nucleic acids are DETECTED. The SARS-CoV-2 RNA is generally detectable in upper and lower  respiratory specimens dur ing the acute phase of infection.  Positive  results are indicative of active infection with SARS-CoV-2.  Clinical  correlation with patient history and other diagnostic information is  necessary to determine patient infection status.  Positive results do  not rule out bacterial infection or co-infection with other viruses. If result is PRESUMPTIVE POSTIVE SARS-CoV-2 nucleic acids MAY BE PRESENT.   A presumptive positive result was obtained on the submitted specimen  and confirmed on repeat testing.  While 2019 novel coronavirus  (SARS-CoV-2) nucleic acids may be present in the submitted sample  additional confirmatory testing may be necessary for epidemiological  and / or clinical management purposes  to differentiate between  SARS-CoV-2 and other Sarbecovirus currently known to infect humans.  If clinically indicated additional testing with an alternate test  methodology [phone]) is advised. The SARS-CoV-2 RNA is generally  detectable in upper and lower respiratory sp ecimens during the acute  phase of infection. The expected result is Negative. Fact Sheet for Patients:  BoilerBrush.com.cy Fact Sheet for Healthcare Providers: https://pope.com/ This test is not yet approved or cleared by the [place] FDA and has been authorized for detection and/or diagnosis of SARS-CoV-2 by FDA under an Emergency Use Authorization (EUA).  This EUA will remain in effect (meaning this test can be used) for the duration of the COVID-19 declaration under Section 564(b)(1) of the Act, 21 U.S.C. section 360bbb-3(b)(1), unless the authorization is terminated or revoked sooner. Performed at [facility], [address] [place], [place] [address]   Culture, blood (Routine X 2) w Reflex to ID Panel     Status: None (Preliminary result)   Collection Time: 12/30/18  8:33 AM   Specimen: BLOOD RIGHT HAND  Result Value Ref Range Status   Specimen Description   Final    BLOOD RIGHT HAND Performed at [facility], [address], [place], [place] [address]    Special Requests   Final    BOTTLES DRAWN AEROBIC ONLY Blood Culture adequate volume Performed at [facility], [address] [place], [place] [address]    Culture   Final    NO GROWTH 2 DAYS Performed at [facility], [address], [place], [place] [address]    Report Status PENDING  Incomplete    Studies/Results: No results found.   Assessment/Plan: Staph aureus bacteremia, MRSA TV IE IVDA ETOH abuse  Total days of antibiotics:4 vanco  Repeat BCx 10-20 ngtd Will need long term anbx Consider CVTS eval He is open to starting on suboxonne (has been on before) States he has only used drugs for 1 month. Becomes quite upset at news of IE.          Johny Sax MD, FACP Infectious Diseases (pager) [phone] [email or web address] 01/01/2019, 9:09 AM  LOS: 4 days

## 2019-01-01 NOTE — Progress Notes (Signed)
Patient ID: Alex [name], male   DOB: August 13, 1985, 33 y.o.   MRN: [id number] [id number]  I have reviewed his chart, 2D echo and TEE. He has MRSA tricuspid valve endocarditis. He had fever of 101.2 and mild leukocytosis on admission with postive BC. TEE shows a small vegetation on the tricuspid valve with severe TR. No other valvular abnormality. He has responded to well to treatment with antibiotic and is afebrile with normal WBC ct. Follow up blood cultures negative. CXR shows no signs of septic pulmonary emboli. There is no indication for surgery in this patient. He should have a complete course of antibiotics and reevaluation with echo after that.

## 2019-01-02 ENCOUNTER — Encounter (HOSPITAL_COMMUNITY): Payer: Self-pay | Admitting: Cardiovascular Disease

## 2019-01-02 DIAGNOSIS — J45909 Unspecified asthma, uncomplicated: Secondary | ICD-10-CM

## 2019-01-02 DIAGNOSIS — D72829 Elevated white blood cell count, unspecified: Secondary | ICD-10-CM

## 2019-01-02 LAB — COMPREHENSIVE METABOLIC PANEL
ALT: 30 U/L (ref 0–44)
AST: 26 U/L (ref 15–41)
Albumin: 2.2 g/dL — ABNORMAL LOW (ref 3.5–5.0)
Alkaline Phosphatase: 155 U/L — ABNORMAL HIGH (ref 38–126)
Anion gap: 7 (ref 5–15)
BUN: 9 mg/dL (ref 6–20)
CO2: 25 mmol/L (ref 22–32)
Calcium: 8.3 mg/dL — ABNORMAL LOW (ref 8.9–10.3)
Chloride: 105 mmol/L (ref 98–111)
Creatinine, Ser: 0.56 mg/dL — ABNORMAL LOW (ref 0.61–1.24)
GFR calc Af Amer: >60 mL/min (ref >60)
GFR calc non Af Amer: >60 mL/min (ref >60)
Glucose, Bld: 94 mg/dL (ref 70–99)
Potassium: 3.9 mmol/L (ref 3.5–5.1)
Sodium: 137 mmol/L (ref 135–145)
Total Bilirubin: 0.2 mg/dL — ABNORMAL LOW (ref 0.3–1.2)
Total Protein: 6.2 g/dL — ABNORMAL LOW (ref 6.5–8.1)

## 2019-01-02 LAB — CBC WITH DIFFERENTIAL/PLATELET
Abs Immature Granulocytes: 0.05 10*3/uL (ref 0.00–0.07)
Basophils Absolute: 0.1 10*3/uL (ref 0.0–0.1)
Basophils Relative: 1 %
Eosinophils Absolute: 0.3 10*3/uL (ref 0.0–0.5)
Eosinophils Relative: 3 %
HCT: 32.8 % — ABNORMAL LOW (ref 39.0–52.0)
Hemoglobin: 10.6 g/dL — ABNORMAL LOW (ref 13.0–17.0)
Immature Granulocytes: 1 %
Lymphocytes Relative: 28 %
Lymphs Abs: 2.9 10*3/uL (ref 0.7–4.0)
MCH: 29.2 pg (ref 26.0–34.0)
MCHC: 32.3 g/dL (ref 30.0–36.0)
MCV: 90.4 fL (ref 80.0–100.0)
Monocytes Absolute: 1.1 10*3/uL — ABNORMAL HIGH (ref 0.1–1.0)
Monocytes Relative: 11 %
Neutro Abs: 6.1 10*3/uL (ref 1.7–7.7)
Neutrophils Relative %: 56 %
Platelets: 338 10*3/uL (ref 150–400)
RBC: 3.63 MIL/uL — ABNORMAL LOW (ref 4.22–5.81)
RDW: 13.1 % (ref 11.5–15.5)
WBC: 10.6 10*3/uL — ABNORMAL HIGH (ref 4.0–10.5)
nRBC: 0 % (ref 0.0–0.2)

## 2019-01-02 LAB — GLUCOSE, CAPILLARY
Glucose-Capillary: 103 mg/dL — ABNORMAL HIGH (ref 70–99)
Glucose-Capillary: 126 mg/dL — ABNORMAL HIGH (ref 70–99)
Glucose-Capillary: 95 mg/dL (ref 70–99)

## 2019-01-02 LAB — PHOSPHORUS: Phosphorus: 3.5 mg/dL (ref 2.5–4.6)

## 2019-01-02 LAB — VANCOMYCIN, TROUGH: Vancomycin Tr: 8 ug/mL — ABNORMAL LOW (ref 15–20)

## 2019-01-02 LAB — VANCOMYCIN, PEAK: Vancomycin Pk: 15 ug/mL — ABNORMAL LOW (ref 30–40)

## 2019-01-02 LAB — MAGNESIUM: Magnesium: 1.8 mg/dL (ref 1.7–2.4)

## 2019-01-02 LAB — RPR: RPR Ser Ql: NONREACTIVE

## 2019-01-02 MED ORDER — VANCOMYCIN HCL 10 G IV SOLR
1750.0000 mg | Freq: Two times a day (BID) | INTRAVENOUS | Status: DC
  Administered 2019-01-02 – 2019-01-03 (×3): 1750 mg via INTRAVENOUS
  Filled 2019-01-02 (×4): qty 1750

## 2019-01-02 MED ORDER — HYDROCORTISONE 1 % EX CREA
1.0000 "application " | TOPICAL_CREAM | Freq: Three times a day (TID) | CUTANEOUS | Status: DC | PRN
  Administered 2019-01-02: 1 via TOPICAL
  Filled 2019-01-02: qty 28

## 2019-01-02 NOTE — Progress Notes (Signed)
Pharmacy Antibiotic Note  Alex [name] is a 33 y.o. male w/ hx of substance use disorder, admitted on 12/28/2018 with sepsis. BCID on 10/18 positive for MRSA. Pt currently on vancomycin for MRSA bacteremia. Hx of SUD, TTE negative for endocarditis on 10/20. 10/21 TEE showed a small vegetation on the tricuspid valve and severe tricuspid insufficiency.  Pharmacy has been consulted for vancomycin dosing.  Today, 01/02/2019 - Day #6 vancomycin for MRSA bacteremia/endocarditis - SCr 0.56 and steady - Remains afebrile and WBCs slightly elevated today at 10.6 - Vanc trough 8 mcg/mL, vanc peak 15 mcg/mL (drawn 4 hours and 15 minutes after the dose), est AUC 346 mcg/mL; est AUC, peak and trough subtherapeutic - will adjust dose as needed  Plan: Vancomycin 1750mg  IV q12h (est AUC 483) Plan next levels 10/25 Measure vancomycin AUC at steady state and as indicated SCr q48h while on vancomycin  Height: 6' (182.9 cm) Weight: 119 lb 11.4 oz (54.3 kg) IBW/kg (Calculated) : 77.6  Temp (24hrs), Avg:97.7 F (36.5 C), Min:97.6 F (36.4 C), Max:97.8 F (36.6 C)  Recent Labs  Lab 12/28/18 0630 12/28/18 0747 12/28/18 1026 12/29/18 0533 12/29/18 0609 12/30/18 2145 12/31/18 0439 12/31/18 0813 01/01/19 0342 01/02/19 0448 01/02/19 0950  WBC 13.3*  --   --  14.8*  --   --  10.2  --  10.0 10.6*  --   CREATININE 0.70  --   --   --  0.62  --  0.50*  --  0.51* 0.56*  --   LATICACIDVEN  --  1.0 1.3  --   --   --   --   --   --   --   --   VANCOTROUGH  --   --   --   --   --   --   --  5*  --   --  8*  VANCOPEAK  --   --   --   --   --  31  --   --   --  15*  --     Estimated Creatinine Clearance: 100.9 mL/min (A) (by C-G formula based on SCr of 0.56 mg/dL (L)).    No Known Allergies  Antimicrobials this admission: Cefepime 10/18 >> 10/19 Vancomycin 10/18 >>   Dose adjustments this admission: 10/20: vancomycin 1000mg  q12h >> vancomycin 1250mg  q12h 10/23: vancomycin 1250mg  q12h >> vancomycin  1750mg  q12h  Microbiology results: 10/18 BCx x2: MRSA 10/18 UCx: MRSA 10/18 MRSA PCR: positive 10/18: Resp Panel: negative 10/18: BCID 4 of 4 MRSA - on Vanc 10/18: COVID: negative 10/20: BCx x2 repeat: no growth x 3 days  Thank you for allowing pharmacy to be a part of this patient's care.  Natale Lay, PharmD Candidate 01/02/2019 11:12 AM

## 2019-01-02 NOTE — Progress Notes (Signed)
PROGRESS NOTE    Alex [name]  XLK:[id number] DOB: 1985/09/17 DOA: 12/28/2018 PCP: Patient, No Pcp Per   Brief Narrative:  Patient is a 33 year old thin Caucasian male with a past medical history significant for but not limited to alcohol abuse, polysubstance abuse, asthma and other comorbidities who presented to the hospital with chief complaint of generalized body aches and chills.  Uses IV heroin and methamphetamines and last meth use was about a week ago and heroin use was prior to ED arrival.  In the ED he is noted to be febrile on septic without unknown etiology and UDS was positive for amphetamines and opiates.  Blood cultures were positive for MRSA and started on vancomycin.  Further work-up reveals that he has a tricuspid endocarditis with severe tricuspid insufficiency.  Blood cultures have been repeated on 10/20/202 and show NGTD at 3 Days.  We will have cardiothoracic surgery evaluate for further evaluation and recommendations and Dr. Cyndia Bent does not feel there is indication for surgery at this time.  Patient will need long-term antibiotics and ID is recommending at least 14 days of IV vancomycin and considering changing to p.o. patient still complaining of some back pain and is open to the idea of maybe trying Suboxone eventually.  Assessment & Plan:   Principal Problem:   Sepsis ([place]) Active Problems:   Tobacco use disorder   Polysubstance dependence ([place])   Substance induced mood disorder ([facility])   IVDU (intravenous drug user)   Endocarditis of tricuspid valve  Sepsis without shock, likely from IV Drug Use MRSA bacteremia from Tricuspid Endocarditis  AMS in the setting of withdrawal and Bacteremia, Improved  -Mentation is greatly improved but was severely agitated and per nursing keeps getting out of the bed and yelling at the Nursing staff but was calm with me yesterday and was calm today  -Ordered Echocardiogram to rule out infective endocarditis and was normal but TEE  showed Tricuspid Valve Endocarditis and Severe Tricuspid Insuffiencey  -CRP was 19.6 and LA was 1.3 -Continue IV Vancomycin-Day 5; Monitor Renal Function closely and Vancomycin peak was 15 and trough was 8 today with a creatinine of 0.56 -Repeat surveillance cultures show NGTD at 3 Days  -Leukocytosis is now 10.6 and mildly elevated -Discontinued Foley -Resumed Regular Diet and patient was on IV fluid hydration with normal saline rate of 100 mL/h but will reduce down to 50 MLS per hour for now and stop after today -ID following and Appreciate further evaluation and recommendations recommending cardiothoracic surgery evaluation I have reached out to the [facility] for formal evaluation given his tricuspid endocarditis along with severe tricuspid valve insufficiency and Dr. Cyndia Bent does not feel there is any indication for surgery at this point -Infectious diseases recommending continue IV antibiotics with vancomycin and changing to p.o. antibiotics after 14 days possibly if he is compliant  Hyponatremia, improved -Resolved with IVF Hydration and is now 137 -Continue to Monitor and Trend -Repeat CMP in AM   Transaminitis/Abnormal LFTs -AST was 57 and ALT was 57 and now improved as AST is 26 and ALT is 30 -Checked RUQ U/S and was read as a normal study -Continue to Monitor and Trend -Repeat CMP in AM   Polysubstance Abuse with concerns of Active Withdrawal Tobacco Use -C/w Withdrawal Protocol with IV Lorazepam 1-4 mg q1hprn and C/w IV Lorazepam 2 mg q4hprn Agitation -Continue Chlordiazepoxide 10 mg po TID D to help with his withdrawal -May consider starting Suboxone but will need a washout. -C/w Nicotine 21  mg TD Patch -C/w Folic Acid 1 mg po Daily, MVI+Minerals, and Thiamine 100 mg po Daily  -C/w Haloperidol Lactate 5 mg IV q6hprn for Agitation -Is open to the idea of possibly starting Suboxone but I will not currently start it at this point  History of Asthma -C/w Albuterol 2.5 mg Neg  q2hprn Wheezing and SOB  Penile Lesion -STI work-up being done and RPR was Non-Reactive -HSV1 Glycoprotein was 55.40, HSV2 Glycoprotein was 1.67, HSV-2 IgG Supplemental Test was Postive and HSVI/II Comb IgM was 1.13 -Hepatitis was negative and GC/Chlaymidia is still pending currently -Infectious diseases is following appreciate their evaluation and recommendations  Underweight -Nutritionist consulted for further evaluation and recommendations -Patient is ordering Ensure Enlive twice daily low 30 mL with Prostat twice daily and continuing to encourage p.o. intakes  Normocytic Anemia -Patient's Hb/HCt is now 12.6/32.8 -Checked Anemia Panel and showed iron level of 16, U IBC of 168, TIBC of 184, saturation ratios of 9%, ferritin level 208, folate level of 8.9, vitamin B12 level 436 -Started the patient on iron polysaccharides 150 mg p.o. daily -Continue to Monitor for S/Sx of Bleeding -Repeat CBC in AM    Tricuspid Insuffiencey  -Seen on TEE and was noted to be Severe -Dr. Royann Shiversroitoru recommended treating Endocarditis and re-evaluating with Transthoracic ECHO after completion of IV Abx -We will have Cardiothoracic surgery evaluate and currently there is no indication for surgery at this point  Hyperglycemia in the setting of Pre-Diabetes -Initially thought to be Reactive in the setting of infection -Checked HbA1c and was 6.1 -Blood Sugars have been ranging from 89-146 on Daily BMP/CMP -Continue to Monitor Blood Sugars carefully and if necessary will place on Sensitive Novolog SSI AC  Hypokalemia, improved -Patient's K+ this AM was 3.9 -Mag Level was 1.8 -Continue to Monitor and Replete as Necessary -Repeat CMP in AM   DVT prophylaxis: Enoxaparin 40 mg sq q24h Code Status: FULL CODE  Family Communication: No Family present at bedside  Disposition Plan: Pending further improvement and Clearance by ID  Consultants:   Cardiology for TEE  Infectious Diseases   PCCM/Pulmonary    Cardiothoraic Surgery notified    Procedures:  ECHOCARDIOGRAM 12/30/2018 IMPRESSIONS    1. Left ventricular ejection fraction, by visual estimation, is 60 to 65%. The left ventricle has normal function. Normal left ventricular size. There is no left ventricular hypertrophy.  2. Global right ventricle has normal systolic function.The right ventricular size is normal. No increase in right ventricular wall thickness.  3. Left atrial size was normal.  4. Right atrial size was normal.  5. The mitral valve is normal in structure. No evidence of mitral valve regurgitation. No evidence of mitral stenosis.  6. Thickened TV leaflet with possible mobile vegetation. ricuspid valve regurgitation is mild.  7. AV is poorly visualized but concerning in the parasternal long axis view for possible vegetation on the ventricular side of AV. Aortic valve regurgitation was not visualized by color flow Doppler.  8. The pulmonic valve was normal in structure. Pulmonic valve regurgitation is not visualized by color flow Doppler.  9. Normal pulmonary artery systolic pressure. 10. The inferior vena cava is normal in size with greater than 50% respiratory variability, suggesting right atrial pressure of 3 mmHg. 11. Recommend TEE for further evaluation for possible vegetation on AV and TV.  FINDINGS  Left Ventricle: Left ventricular ejection fraction, by visual estimation, is 60 to 65%. The left ventricle has normal function. There is no left ventricular hypertrophy. Normal left ventricular  size.  Right Ventricle: The right ventricular size is normal. No increase in right ventricular wall thickness. Global RV systolic function is has normal systolic function. The tricuspid regurgitant velocity is 2.13 m/s, and with an assumed right atrial pressure  of 3 mmHg, the estimated right ventricular systolic pressure is normal at 21.1 mmHg.  Left Atrium: Left atrial size was normal in size.  Right Atrium: Right atrial  size was normal in size  Pericardium: There is no evidence of pericardial effusion.  Mitral Valve: The mitral valve is normal in structure. No evidence of mitral valve stenosis by observation. No evidence of mitral valve regurgitation.  Tricuspid Valve:Tricuspid valve regurgitation is mild by color flow Doppler. Thickened TV leaflet with possible mobile vegetation.    Aortic Valve: The aortic valve was not well visualized. Aortic valve regurgitation was not visualized by color flow Doppler. The aortic valve is structurally normal, with no evidence of sclerosis or stenosis. AV is poorly visualized but concerning in the  parasternal long axis view for possible vegetation on the ventricular side of AV.  Pulmonic Valve: The pulmonic valve was normal in structure. Pulmonic valve regurgitation is not visualized by color flow Doppler.  Aorta: The aortic root, ascending aorta and aortic arch are all structurally normal, with no evidence of dilitation or obstruction.  Venous: The inferior vena cava is normal in size with greater than 50% respiratory variability, suggesting right atrial pressure of 3 mmHg.  IAS/Shunts: No atrial level shunt detected by color flow Doppler. No ventricular septal defect is seen or detected. There is no evidence of an atrial septal defect.     LEFT VENTRICLE PLAX 2D LVIDd:         4.20 cm  Diastology LVIDs:         2.90 cm  LV e' lateral:   12.40 cm/s LV PW:         1.20 cm  LV E/e' lateral: 4.2 LV IVS:        1.10 cm  LV e' medial:    16.20 cm/s LVOT diam:     2.20 cm  LV E/e' medial:  3.2 LV SV:         46 ml LV SV Index:   28.28 LVOT Area:     3.80 cm    RIGHT VENTRICLE RV S prime:     14.10 cm/s TAPSE (M-mode): 2.6 cm  LEFT ATRIUM             Index LA diam:        2.80 cm 1.63 cm/m LA Vol (A2C):   23.8 ml 13.89 ml/m LA Vol (A4C):   34.9 ml 20.37 ml/m LA Biplane Vol: 31.1 ml 18.16 ml/m  AORTIC VALVE LVOT Vmax:   84.90 cm/s LVOT  Vmean:  52.200 cm/s LVOT VTI:    0.126 m   AORTA Ao Asc diam: 2.90 cm  MITRAL VALVE                        TRICUSPID VALVE MV Area (PHT): 4.63 cm             TR Peak grad:   18.1 mmHg MV PHT:        47.56 msec           TR Vmax:        216.00 cm/s MV Decel Time: 164 msec MV E velocity: 52.30 cm/s 103 cm/s  SHUNTS MV A velocity: 42.00 cm/s 70.3 cm/s  Systemic VTI:  0.13 m MV E/A ratio:  1.25       1.5       Systemic Diam: 2.20 cm  TEE 12/31/2018 IMPRESSIONS    1. Left ventricular ejection fraction, by visual estimation, is 60 to 65%. The left ventricle has normal function. Normal left ventricular size. There is no left ventricular hypertrophy.  2. Global right ventricle has normal systolic function.The right ventricular size is normal. No increase in right ventricular wall thickness.  3. Left atrial size was normal.  4. Right atrial size was normal.  5. The mitral valve is normal in structure. Trace mitral valve regurgitation. No evidence of mitral stenosis.  6. Small vegetation on the tricuspid valve. There is a probable perforation of the anterior leaflet, adjacent to the vegetation.  7. The tricuspid valve is normal in structure. Tricuspid valve regurgitation severe.  8. The aortic valve is normal in structure. Aortic valve regurgitation was not visualized by color flow Doppler. Structurally normal aortic valve, with no evidence of sclerosis or stenosis.  9. The pulmonic valve was normal in structure. Pulmonic valve regurgitation is not visualized by color flow Doppler. 10. Mildly elevated pulmonary artery systolic pressure.  FINDINGS  Left Ventricle: Left ventricular ejection fraction, by visual estimation, is 60 to 65%. The left ventricle has normal function. No evidence of left ventricular regional wall motion abnormalities. There is no left ventricular hypertrophy. Normal left  ventricular size.  Right Ventricle: The right ventricular size is normal. No increase in right  ventricular wall thickness. Global RV systolic function is has normal systolic function. The tricuspid regurgitant velocity is 2.65 m/s, and with an assumed right atrial pressure  of 8 mmHg, the estimated right ventricular systolic pressure is mildly elevated at 36.1 mmHg.  Left Atrium: Left atrial size was normal in size.  Right Atrium: Right atrial size was normal in size  Pericardium: There is no evidence of pericardial effusion.  Mitral Valve: The mitral valve is normal in structure. No evidence of mitral valve stenosis by observation. Trace mitral valve regurgitation. There is no evidence of mitral valve vegetation.  Tricuspid Valve: The tricuspid valve is normal in structure. Tricuspid valve regurgitation severe by color flow Doppler. There is a small non-mobile tricuspid valve vegetation on the anterior leaflet. The TV vegetation measures 5 mm x 7 mm. There appears  to be a 3 mm diameter perforation in the anterior leaflet.  Aortic Valve: The aortic valve is normal in structure. Aortic valve regurgitation was not visualized by color flow Doppler. The aortic valve is structurally normal, with no evidence of sclerosis or stenosis.  Pulmonic Valve: The pulmonic valve was normal in structure. Pulmonic valve regurgitation is not visualized by color flow Doppler.  Aorta: The aortic root, ascending aorta and aortic arch are all structurally normal, with no evidence of dilitation or obstruction.  Shunts: No ventricular septal defect is seen or detected. There is no evidence of an atrial septal defect. No atrial level shunt detected by color flow Doppler.    TRICUSPID VALVE             Normals TR Peak grad:   28.1 mmHg TR Vmax:        265.00 cm/s 288 cm/s  Antimicrobials:  Anti-infectives (From admission, onward)   Start     Dose/Rate Route Frequency Ordered Stop   12/31/18 1000  vancomycin (VANCOCIN) 1,250 mg in sodium chloride 0.9 % 250 mL IVPB     1,250 mg 166.7 mL/hr over  90 Minutes Intravenous Every 12 hours 12/31/18 0929     12/28/18 1800  vancomycin (VANCOCIN) IVPB 1000 mg/200 mL premix  Status:  Discontinued     1,000 mg 200 mL/hr over 60 Minutes Intravenous 2 times daily 12/28/18 0914 12/31/18 0929   12/28/18 1400  ceFEPIme (MAXIPIME) 2 g in sodium chloride 0.9 % 100 mL IVPB  Status:  Discontinued     2 g 200 mL/hr over 30 Minutes Intravenous Every 8 hours 12/28/18 0914 12/29/18 1542   12/28/18 0730  vancomycin (VANCOCIN) 1,250 mg in sodium chloride 0.9 % 250 mL IVPB     1,250 mg 166.7 mL/hr over 90 Minutes Intravenous STAT 12/28/18 0715 12/28/18 0935   12/28/18 0730  ceFEPIme (MAXIPIME) 2 g in sodium chloride 0.9 % 100 mL IVPB     2 g 200 mL/hr over 30 Minutes Intravenous STAT 12/28/18 0715 12/28/18 0833     Subjective: Seen and examined at bedside still complaining of some back pain.  No nausea or vomiting.  Feels okay.  No other concerns or complaints at this time.  Objective: Vitals:   01/01/19 1232 01/02/19 0002 01/02/19 0408 01/02/19 0605  BP: 129/90 121/72  126/84  Pulse: 84 90  89  Resp: 20 18  18   Temp: 97.7 F (36.5 C) 97.6 F (36.4 C)  97.8 F (36.6 C)  TempSrc: Axillary Oral  Oral  SpO2: 99% 97% 98% 97%  Weight:      Height:        Intake/Output Summary (Last 24 hours) at 01/02/2019 1207 Last data filed at 01/02/2019 1610 Gross per 24 hour  Intake 2888.74 ml  Output 1175 ml  Net 1713.74 ml   Filed Weights   12/28/18 0040 12/28/18 1254  Weight: 56.7 kg 54.3 kg   Examination: Physical Exam:  Constitutional: Thin Caucasian male in NAD and appears calm Eyes: Lids and conjunctivae normal, sclerae anicteric  ENMT: External Ears, Nose appear normal. Grossly normal hearing. Has a hole in Right ear from prior earing Neck: Appears normal, supple, no cervical masses, normal ROM, no appreciable thyromegaly; no JVD Respiratory: Diminished to auscultation bilaterally, no wheezing, rales, rhonchi or crackles. Normal respiratory  effort and patient is not tachypenic. No accessory muscle use. Unlabored breathing   Cardiovascular: RRR, no murmurs / rubs / gallops. S1 and S2 auscultated. No extremity edema. Abdomen: Soft, non-tender, non-distended. Bowel sounds positive x4.  GU: Deferred. Musculoskeletal: No clubbing / cyanosis of digits/nails. No joint deformity upper and lower extremities Skin: No rashes, lesions, ulcers on a limited skin evaluation. No induration; Warm and dry.  Neurologic: CN 2-12 grossly intact with no focal deficits. Romberg sign and cerebellar reflexes not assessed.  Psychiatric: Normal judgment and insight. Alert and oriented x 3. Normal mood and appropriate affect.   Data Reviewed: I have personally reviewed following labs and imaging studies  CBC: Recent Labs  Lab 12/28/18 0630 12/29/18 0533 12/31/18 0439 01/01/19 0342 01/02/19 0448  WBC 13.3* 14.8* 10.2 10.0 10.6*  NEUTROABS 11.1*  --   --  6.9 6.1  HGB 11.8* 12.2* 10.9* 9.8* 10.6*  HCT 35.7* 35.5* 32.7* 29.8* 32.8*  MCV 89.0 85.5 87.9 88.4 90.4  PLT 208 178 239 265 338   Basic Metabolic Panel: Recent Labs  Lab 12/28/18 0630 12/29/18 0609 12/29/18 0920 12/31/18 0439 01/01/19 0342 01/02/19 0448  NA 130* 139  --  139 138 137  K 3.9 3.7  --  3.1* 3.5 3.9  CL 95* 110  --  108  106 105  CO2 22 18*  --  GLUCOSE 110* 89  --  146* 125* 94  BUN 8 9  --  7 5* 9  CREATININE 0.70 0.62  --  0.50* 0.51* 0.56*  CALCIUM 8.7* 8.4*  --  7.9* 8.2* 8.3*  MG  --   --  1.9 1.9 1.9 1.8  PHOS  --   --  3.9  --  3.3 3.5   GFR: Estimated Creatinine Clearance: 100.9 mL/min (A) (by C-G formula based on SCr of 0.56 mg/dL (L)). Liver Function Tests: Recent Labs  Lab 12/28/18 0630 12/29/18 0609 01/01/19 0342 01/02/19 0448  AST 57* ALT 57* 36 27 30  ALKPHOS 231* 166* 151* 155*  BILITOT 0.8 0.7 0.4 0.2*  PROT 7.0 6.0* 5.9* 6.2*  ALBUMIN 2.9* 2.5* 2.2* 2.2*   No results for input(s): LIPASE, AMYLASE in the last 168  hours. No results for input(s): AMMONIA in the last 168 hours. Coagulation Profile: Recent Labs  Lab 12/28/18 0747  INR 1.2   Cardiac Enzymes: Recent Labs  Lab 12/28/18 0630  CKTOTAL 52   BNP (last 3 results) No results for input(s): PROBNP in the last 8760 hours. HbA1C: Recent Labs    01/01/19 0342  HGBA1C 6.1*   CBG: Recent Labs  Lab 01/01/19 0613 01/01/19 1229 01/01/19 1854 01/02/19 0004 01/02/19 0533  GLUCAP 80 101* 107* 126* 95   Lipid Profile: No results for input(s): CHOL, HDL, LDLCALC, TRIG, CHOLHDL, LDLDIRECT in the last 72 hours. Thyroid Function Tests: No results for input(s): TSH, T4TOTAL, FREET4, T3FREE, THYROIDAB in the last 72 hours. Anemia Panel: Recent Labs    01/01/19 0342  VITAMINB12 436  FOLATE 8.9  FERRITIN 208  TIBC 184*  IRON 16*  RETICCTPCT 0.4   Sepsis Labs: Recent Labs  Lab 12/28/18 0747 12/28/18 1026  LATICACIDVEN 1.0 1.3    Recent Results (from the past 240 hour(s))  SARS CORONAVIRUS 2 (TAT 6-24 HRS) Nasopharyngeal Nasopharyngeal Swab     Status: None   Collection Time: 12/28/18  5:37 AM   Specimen: Nasopharyngeal Swab  Result Value Ref Range Status   SARS Coronavirus 2 NEGATIVE NEGATIVE Final    Comment: (NOTE) SARS-CoV-2 target nucleic acids are NOT DETECTED. The SARS-CoV-2 RNA is generally detectable in upper and lower respiratory specimens during the acute phase of infection. Negative results do not preclude SARS-CoV-2 infection, do not rule out co-infections with other pathogens, and should not be used as the sole basis for treatment or other patient management decisions. Negative results must be combined with clinical observations, patient history, and epidemiological information. The expected result is Negative. Fact Sheet for Patients: HairSlick.no Fact Sheet for Healthcare Providers: quierodirigir.com This test is not yet approved or cleared by the  [place] FDA and  has been authorized for detection and/or diagnosis of SARS-CoV-2 by FDA under an Emergency Use Authorization (EUA). This EUA will remain  in effect (meaning this test can be used) for the duration of the COVID-19 declaration under Section 56 4(b)(1) of the Act, 21 U.S.C. section 360bbb-3(b)(1), unless the authorization is terminated or revoked sooner. Performed at [facility], [address], [place], [place] [address]   Blood culture (routine x 2)     Status: Abnormal   Collection Time: 12/28/18  7:47 AM   Specimen: BLOOD RIGHT ARM  Result Value Ref Range Status   Specimen Description   Final    BLOOD RIGHT ARM  Performed at [facility], [address] [place], [place] [address]    Special Requests   Final    BOTTLES DRAWN AEROBIC AND ANAEROBIC Blood Culture results may not be optimal due to an excessive volume of blood received in culture bottles Performed at [facility], [address], [place], [place] [address]    Culture  Setup Time   Final    GRAM POSITIVE COCCI IN BOTH AEROBIC AND ANAEROBIC BOTTLES CRITICAL RESULT CALLED TO, READ BACK BY AND VERIFIED WITH: PHARMD JUSTIN L 1948 [id number] FCP CRITICAL RESULT CALLED TO, READ BACK BY AND VERIFIED WITH: B GREEN,PHARMD AT 2206 12/28/2018 BY L BENFIELD Performed at [facility], [address] [place], [place] [address]    Culture METHICILLIN RESISTANT STAPHYLOCOCCUS AUREUS (A)  Final   Report Status 12/30/2018 FINAL  Final   Organism ID, Bacteria METHICILLIN RESISTANT STAPHYLOCOCCUS AUREUS  Final      Susceptibility   Methicillin resistant staphylococcus aureus - MIC*    CIPROFLOXACIN >=8 RESISTANT Resistant     ERYTHROMYCIN >=8 RESISTANT Resistant     GENTAMICIN <=0.5 SENSITIVE Sensitive     OXACILLIN >=4 RESISTANT Resistant     TETRACYCLINE <=1 SENSITIVE Sensitive     VANCOMYCIN <=0.5 SENSITIVE Sensitive     TRIMETH/SULFA <=10 SENSITIVE Sensitive      CLINDAMYCIN <=0.25 SENSITIVE Sensitive     RIFAMPIN <=0.5 SENSITIVE Sensitive     Inducible Clindamycin NEGATIVE Sensitive     * METHICILLIN RESISTANT STAPHYLOCOCCUS AUREUS  Blood culture (routine x 2)     Status: Abnormal   Collection Time: 12/28/18  7:47 AM   Specimen: BLOOD  Result Value Ref Range Status   Specimen Description   Final    BLOOD RIGHT WRIST Performed at [facility], [address], [place], [place] [address]    Special Requests   Final    BOTTLES DRAWN AEROBIC AND ANAEROBIC Blood Culture results may not be optimal due to an inadequate volume of blood received in culture bottles Performed at [facility], [address] [place], [place] [address]    Culture  Setup Time   Final    GRAM POSITIVE COCCI IN CLUSTERS IN BOTH AEROBIC AND ANAEROBIC BOTTLES CRITICAL VALUE NOTED.  VALUE IS CONSISTENT WITH PREVIOUSLY REPORTED AND CALLED VALUE.    Culture (A)  Final    STAPHYLOCOCCUS AUREUS SUSCEPTIBILITIES PERFORMED ON PREVIOUS CULTURE WITHIN THE LAST 5 DAYS. Performed at [facility], [address], [place], [place] [address]    Report Status 12/30/2018 FINAL  Final  Urine culture     Status: Abnormal   Collection Time: 12/28/18  7:47 AM   Specimen: In/Out Cath Urine  Result Value Ref Range Status   Specimen Description   Final    IN/OUT CATH URINE Performed at [facility], [address] [place], [place] [address]    Special Requests   Final    NONE Performed at [facility], [address] [place], [place] [address]    Culture (A)  Final    30,000 COLONIES/mL METHICILLIN RESISTANT STAPHYLOCOCCUS AUREUS   Report Status 12/30/2018 FINAL  Final   Organism ID, Bacteria METHICILLIN RESISTANT STAPHYLOCOCCUS AUREUS (A)  Final      Susceptibility   Methicillin resistant staphylococcus aureus - MIC*    CIPROFLOXACIN >=8 RESISTANT Resistant     GENTAMICIN <=0.5 SENSITIVE Sensitive      NITROFURANTOIN <=16 SENSITIVE Sensitive  OXACILLIN >=4 RESISTANT Resistant     TETRACYCLINE <=1 SENSITIVE Sensitive     VANCOMYCIN 1 SENSITIVE Sensitive     TRIMETH/SULFA <=10 SENSITIVE Sensitive     CLINDAMYCIN <=0.25 SENSITIVE Sensitive     RIFAMPIN <=0.5 SENSITIVE Sensitive     Inducible Clindamycin NEGATIVE Sensitive     * 30,000 COLONIES/mL METHICILLIN RESISTANT STAPHYLOCOCCUS AUREUS  Blood Culture ID Panel (Reflexed)     Status: Abnormal   Collection Time: 12/28/18  7:47 AM  Result Value Ref Range Status   Enterococcus species NOT DETECTED NOT DETECTED Final   Listeria monocytogenes NOT DETECTED NOT DETECTED Final   Staphylococcus species DETECTED (A) NOT DETECTED Final    Comment: CRITICAL RESULT CALLED TO, READ BACK BY AND VERIFIED WITH: B GREEN,PHARMD AT 2206 12/28/2018 BY L BENFIELD    Staphylococcus aureus (BCID) DETECTED (A) NOT DETECTED Final    Comment: Methicillin (oxacillin)-resistant Staphylococcus aureus (MRSA). MRSA is predictably resistant to beta-lactam antibiotics (except ceftaroline). Preferred therapy is vancomycin unless clinically contraindicated. Patient requires contact precautions if  hospitalized. CRITICAL RESULT CALLED TO, READ BACK BY AND VERIFIED WITH: B GREEN,PHARMD AT 2206 12/28/2018 BY L BENFIELD    Methicillin resistance DETECTED (A) NOT DETECTED Final    Comment: CRITICAL RESULT CALLED TO, READ BACK BY AND VERIFIED WITH: B GREEN,PHARMD AT 2206 12/28/2018 BY L BENFIELD    Streptococcus species NOT DETECTED NOT DETECTED Final   Streptococcus agalactiae NOT DETECTED NOT DETECTED Final   Streptococcus pneumoniae NOT DETECTED NOT DETECTED Final   Streptococcus pyogenes NOT DETECTED NOT DETECTED Final   Acinetobacter baumannii NOT DETECTED NOT DETECTED Final   Enterobacteriaceae species NOT DETECTED NOT DETECTED Final   Enterobacter cloacae complex NOT DETECTED NOT DETECTED Final   Escherichia coli NOT DETECTED NOT DETECTED Final   Klebsiella  oxytoca NOT DETECTED NOT DETECTED Final   Klebsiella pneumoniae NOT DETECTED NOT DETECTED Final   Proteus species NOT DETECTED NOT DETECTED Final   Serratia marcescens NOT DETECTED NOT DETECTED Final   Haemophilus influenzae NOT DETECTED NOT DETECTED Final   Neisseria meningitidis NOT DETECTED NOT DETECTED Final   Pseudomonas aeruginosa NOT DETECTED NOT DETECTED Final   Candida albicans NOT DETECTED NOT DETECTED Final   Candida glabrata NOT DETECTED NOT DETECTED Final   Candida krusei NOT DETECTED NOT DETECTED Final   Candida parapsilosis NOT DETECTED NOT DETECTED Final   Candida tropicalis NOT DETECTED NOT DETECTED Final    Comment: Performed at [facility], [address], [place], [place] [address]  MRSA PCR Screening     Status: Abnormal   Collection Time: 12/28/18 12:51 PM   Specimen: Nasal Mucosa; Nasopharyngeal  Result Value Ref Range Status   MRSA by PCR POSITIVE (A) NEGATIVE Final    Comment:        The GeneXpert MRSA Assay (FDA approved for NASAL specimens only), is one component of a comprehensive MRSA colonization surveillance program. It is not intended to diagnose MRSA infection nor to guide or monitor treatment for MRSA infections. RESULT CALLED TO, READ BACK BY AND VERIFIED WITH: K ZULETA,RN 12/28/18 1507 RHOLMES Performed at [facility], [address], [place], [place] [address]   Respiratory Panel by PCR     Status: None   Collection Time: 12/28/18  1:59 PM   Specimen: Nasopharyngeal Swab; Respiratory  Result Value Ref Range Status   Adenovirus NOT DETECTED NOT DETECTED Final   Coronavirus 229E NOT DETECTED NOT DETECTED Final    Comment: (NOTE) The Coronavirus  on the Respiratory Panel, DOES NOT test for the novel  Coronavirus (2019 nCoV)    Coronavirus HKU1 NOT DETECTED NOT DETECTED Final   Coronavirus NL63 NOT DETECTED NOT DETECTED Final   Coronavirus OC43 NOT DETECTED NOT DETECTED Final   Metapneumovirus NOT DETECTED NOT  DETECTED Final   Rhinovirus / Enterovirus NOT DETECTED NOT DETECTED Final   Influenza A NOT DETECTED NOT DETECTED Final   Influenza B NOT DETECTED NOT DETECTED Final   Parainfluenza Virus 1 NOT DETECTED NOT DETECTED Final   Parainfluenza Virus 2 NOT DETECTED NOT DETECTED Final   Parainfluenza Virus 3 NOT DETECTED NOT DETECTED Final   Parainfluenza Virus 4 NOT DETECTED NOT DETECTED Final   Respiratory Syncytial Virus NOT DETECTED NOT DETECTED Final   Bordetella pertussis NOT DETECTED NOT DETECTED Final   Chlamydophila pneumoniae NOT DETECTED NOT DETECTED Final   Mycoplasma pneumoniae NOT DETECTED NOT DETECTED Final    Comment: Performed at [facility], [address] [place], [place] [address]  SARS Coronavirus 2 by RT PCR (hospital order, performed in [facility] hospital lab) Nasopharyngeal Nasopharyngeal Swab     Status: None   Collection Time: 12/28/18  1:59 PM   Specimen: Nasopharyngeal Swab  Result Value Ref Range Status   SARS Coronavirus 2 NEGATIVE NEGATIVE Final    Comment: (NOTE) If result is NEGATIVE SARS-CoV-2 target nucleic acids are NOT DETECTED. The SARS-CoV-2 RNA is generally detectable in upper and lower  respiratory specimens during the acute phase of infection. The lowest  concentration of SARS-CoV-2 viral copies this assay can detect is 250  copies / mL. A negative result does not preclude SARS-CoV-2 infection  and should not be used as the sole basis for treatment or other  patient management decisions.  A negative result may occur with  improper specimen collection / handling, submission of specimen other  than nasopharyngeal swab, presence of viral mutation(s) within the  areas targeted by this assay, and inadequate number of viral copies  (<250 copies / mL). A negative result must be combined with clinical  observations, patient history, and epidemiological information. If result is POSITIVE SARS-CoV-2 target nucleic acids are DETECTED. The SARS-CoV-2  RNA is generally detectable in upper and lower  respiratory specimens dur ing the acute phase of infection.  Positive  results are indicative of active infection with SARS-CoV-2.  Clinical  correlation with patient history and other diagnostic information is  necessary to determine patient infection status.  Positive results do  not rule out bacterial infection or co-infection with other viruses. If result is PRESUMPTIVE POSTIVE SARS-CoV-2 nucleic acids MAY BE PRESENT.   A presumptive positive result was obtained on the submitted specimen  and confirmed on repeat testing.  While 2019 novel coronavirus  (SARS-CoV-2) nucleic acids may be present in the submitted sample  additional confirmatory testing may be necessary for epidemiological  and / or clinical management purposes  to differentiate between  SARS-CoV-2 and other Sarbecovirus currently known to infect humans.  If clinically indicated additional testing with an alternate test  methodology [phone]) is advised. The SARS-CoV-2 RNA is generally  detectable in upper and lower respiratory sp ecimens during the acute  phase of infection. The expected result is Negative. Fact Sheet for Patients:  BoilerBrush.com.cy Fact Sheet for Healthcare Providers: https://pope.com/ This test is not yet approved or cleared by the [place] FDA and has been authorized for detection and/or diagnosis of SARS-CoV-2 by FDA under an Emergency Use Authorization (EUA).  This EUA  will remain in effect (meaning this test can be used) for the duration of the COVID-19 declaration under Section 564(b)(1) of the Act, 21 U.S.C. section 360bbb-3(b)(1), unless the authorization is terminated or revoked sooner. Performed at [facility] [address] [place], [place] [address]   Culture, blood (Routine X 2) w Reflex to ID Panel     Status: None (Preliminary result)   Collection Time:  12/30/18  8:33 AM   Specimen: BLOOD RIGHT HAND  Result Value Ref Range Status   Specimen Description   Final    BLOOD RIGHT HAND Performed at [facility], [address] [place], [place] [address]    Special Requests   Final    BOTTLES DRAWN AEROBIC ONLY Blood Culture adequate volume Performed at [facility], [address], [place], [place] [address]    Culture   Final    NO GROWTH 3 DAYS Performed at [facility], [address], [place], [place] [address]    Report Status PENDING  Incomplete    RN Pressure Injury Documentation:     Estimated body mass index is 16.24 kg/m as calculated from the following:   Height as of this encounter: 6' (1.829 m).   Weight as of this encounter: 54.3 kg.  Malnutrition Type:  Nutrition Problem: Increased nutrient needs Etiology: acute illness(sepsis)   Malnutrition Characteristics:  Signs/Symptoms: estimated needs   Nutrition Interventions:  Interventions: MVI, Prostat, Ensure Enlive (each supplement provides 350kcal and 20 grams of protein)   Radiology Studies: No results found.   Scheduled Meds: . chlordiazePOXIDE  10 mg Oral TID  . Chlorhexidine Gluconate Cloth  6 each Topical Q0600  . enoxaparin (LOVENOX) injection  40 mg Subcutaneous Q24H  . feeding supplement (ENSURE ENLIVE)  237 mL Oral BID BM  . feeding supplement (PRO-STAT SUGAR FREE 64)  30 mL Oral BID  . folic acid  1 mg Oral Daily  . iron polysaccharides  150 mg Oral Daily  . mouth rinse  15 mL Mouth Rinse BID  . multivitamin with minerals  1 tablet Oral Daily  . nicotine  21 mg Transdermal Daily  . sodium chloride flush  10-40 mL Intracatheter Q12H  . thiamine  100 mg Oral Daily   Or  . thiamine  100 mg Intravenous Daily   Continuous Infusions: . sodium chloride 50 mL/hr at 01/01/19 1405  . vancomycin 1,250 mg (01/02/19 1024)    LOS: 5 days   Merlene Laughter, DO [facility] PAGER is on AMION   If 7PM-7AM, please contact night-coverage [email or web address] Password [email or web address] 01/02/2019, 12:07 PM

## 2019-01-02 NOTE — Progress Notes (Addendum)
At 0237 the pharmacy called and notified this writer that there was a Vancomycin peak ordered.  I was only informed at shift change of a vancomycin trough due on day shift prior to am dose. This Probation officer immediately put in an order for IV team to draw the lab.  As of this time (5027) the lab has not been drawn and I have sent a page to the number listed for IV team [phone]) with no response. Got in contact with the IV team RN almost immediately after this original note, the pager number listed on the telephone list is incorrect.   The lab was drawn immediately

## 2019-01-02 NOTE — Progress Notes (Signed)
INFECTIOUS DISEASE PROGRESS NOTE  ID: Alex [name] is a 33 y.o. male with  Principal Problem:   Sepsis ([place]) Active Problems:   Tobacco use disorder   Polysubstance dependence ([place])   Substance induced mood disorder ([place])   IVDU (intravenous drug user)   Endocarditis of tricuspid valve  Subjective: Up brushing teeth.  C/o back, chest pain (relates to his asthma)  Abtx:  Anti-infectives (From admission, onward)   Start     Dose/Rate Route Frequency Ordered Stop   12/31/18 1000  vancomycin (VANCOCIN) 1,250 mg in sodium chloride 0.9 % 250 mL IVPB     1,250 mg 166.7 mL/hr over 90 Minutes Intravenous Every 12 hours 12/31/18 0929     12/28/18 1800  vancomycin (VANCOCIN) IVPB 1000 mg/200 mL premix  Status:  Discontinued     1,000 mg 200 mL/hr over 60 Minutes Intravenous 2 times daily 12/28/18 0914 12/31/18 0929   12/28/18 1400  ceFEPIme (MAXIPIME) 2 g in sodium chloride 0.9 % 100 mL IVPB  Status:  Discontinued     2 g 200 mL/hr over 30 Minutes Intravenous Every 8 hours 12/28/18 0914 12/29/18 1542   12/28/18 0730  vancomycin (VANCOCIN) 1,250 mg in sodium chloride 0.9 % 250 mL IVPB     1,250 mg 166.7 mL/hr over 90 Minutes Intravenous STAT 12/28/18 0715 12/28/18 0935   12/28/18 0730  ceFEPIme (MAXIPIME) 2 g in sodium chloride 0.9 % 100 mL IVPB     2 g 200 mL/hr over 30 Minutes Intravenous STAT 12/28/18 0715 12/28/18 0833      Medications:  Scheduled: . chlordiazePOXIDE  10 mg Oral TID  . Chlorhexidine Gluconate Cloth  6 each Topical Q0600  . enoxaparin (LOVENOX) injection  40 mg Subcutaneous Q24H  . feeding supplement (ENSURE ENLIVE)  237 mL Oral BID BM  . feeding supplement (PRO-STAT SUGAR FREE 64)  30 mL Oral BID  . folic acid  1 mg Oral Daily  . iron polysaccharides  150 mg Oral Daily  . mouth rinse  15 mL Mouth Rinse BID  . multivitamin with minerals  1 tablet Oral Daily  . nicotine  21 mg Transdermal Daily  . sodium chloride flush  10-40 mL Intracatheter Q12H  .  thiamine  100 mg Oral Daily   Or  . thiamine  100 mg Intravenous Daily    Objective: Vital signs in last 24 hours: Temp:  [97.6 F (36.4 C)-97.8 F (36.6 C)] 97.8 F (36.6 C) (10/23 0605) Pulse Rate:  [84-90] 89 (10/23 0605) Resp:  [18-20] 18 (10/23 0605) BP: (121-129)/(72-90) 126/84 (10/23 0605) SpO2:  [97 %-99 %] 97 % (10/23 0605)   General appearance: alert, cooperative and no distress Resp: clear to auscultation bilaterally Cardio: regular rate and rhythm GI: normal findings: bowel sounds normal and soft, non-tender  Lab Results Recent Labs    01/01/19 0342 01/02/19 0448  WBC 10.0 10.6*  HGB 9.8* 10.6*  HCT 29.8* 32.8*  NA 138 137  K 3.5 3.9  CL 106 105  CO2 24 25  BUN 5* 9  CREATININE 0.51* 0.56*   Liver Panel Recent Labs    01/01/19 0342 01/02/19 0448  PROT 5.9* 6.2*  ALBUMIN 2.2* 2.2*  AST 22 26  ALT 27 30  ALKPHOS 151* 155*  BILITOT 0.4 0.2*   Sedimentation Rate No results for input(s): ESRSEDRATE in the last 72 hours. C-Reactive Protein No results for input(s): CRP in the last 72 hours.  Microbiology: Recent Results (from the past  240 hour(s))  SARS CORONAVIRUS 2 (TAT 6-24 HRS) Nasopharyngeal Nasopharyngeal Swab     Status: None   Collection Time: 12/28/18  5:37 AM   Specimen: Nasopharyngeal Swab  Result Value Ref Range Status   SARS Coronavirus 2 NEGATIVE NEGATIVE Final    Comment: (NOTE) SARS-CoV-2 target nucleic acids are NOT DETECTED. The SARS-CoV-2 RNA is generally detectable in upper and lower respiratory specimens during the acute phase of infection. Negative results do not preclude SARS-CoV-2 infection, do not rule out co-infections with other pathogens, and should not be used as the sole basis for treatment or other patient management decisions. Negative results must be combined with clinical observations, patient history, and epidemiological information. The expected result is Negative. Fact Sheet for Patients:  HairSlick.no Fact Sheet for Healthcare Providers: quierodirigir.com This test is not yet approved or cleared by the [place] FDA and  has been authorized for detection and/or diagnosis of SARS-CoV-2 by FDA under an Emergency Use Authorization (EUA). This EUA will remain  in effect (meaning this test can be used) for the duration of the COVID-19 declaration under Section 56 4(b)(1) of the Act, 21 U.S.C. section 360bbb-3(b)(1), unless the authorization is terminated or revoked sooner. Performed at [facility], [address] [place] [place], [place] [address]   Blood culture (routine x 2)     Status: Abnormal   Collection Time: 12/28/18  7:47 AM   Specimen: BLOOD RIGHT ARM  Result Value Ref Range Status   Specimen Description   Final    BLOOD RIGHT ARM Performed at [facility], [address] [place], [place] [address]    Special Requests   Final    BOTTLES DRAWN AEROBIC AND ANAEROBIC Blood Culture results may not be optimal due to an excessive volume of blood received in culture bottles Performed at [facility], [address] [place], [place] [address]    Culture  Setup Time   Final    GRAM POSITIVE COCCI IN BOTH AEROBIC AND ANAEROBIC BOTTLES CRITICAL RESULT CALLED TO, READ BACK BY AND VERIFIED WITH: PHARMD JUSTIN L 1948 [id number] FCP CRITICAL RESULT CALLED TO, READ BACK BY AND VERIFIED WITH: B GREEN,PHARMD AT 2206 12/28/2018 BY L BENFIELD Performed at [facility], [address], [place], [place] [address]    Culture METHICILLIN RESISTANT STAPHYLOCOCCUS AUREUS (A)  Final   Report Status 12/30/2018 FINAL  Final   Organism ID, Bacteria METHICILLIN RESISTANT STAPHYLOCOCCUS AUREUS  Final      Susceptibility   Methicillin resistant staphylococcus aureus - MIC*    CIPROFLOXACIN >=8 RESISTANT Resistant     ERYTHROMYCIN >=8 RESISTANT Resistant     GENTAMICIN <=0.5 SENSITIVE Sensitive      OXACILLIN >=4 RESISTANT Resistant     TETRACYCLINE <=1 SENSITIVE Sensitive     VANCOMYCIN <=0.5 SENSITIVE Sensitive     TRIMETH/SULFA <=10 SENSITIVE Sensitive     CLINDAMYCIN <=0.25 SENSITIVE Sensitive     RIFAMPIN <=0.5 SENSITIVE Sensitive     Inducible Clindamycin NEGATIVE Sensitive     * METHICILLIN RESISTANT STAPHYLOCOCCUS AUREUS  Blood culture (routine x 2)     Status: Abnormal   Collection Time: 12/28/18  7:47 AM   Specimen: BLOOD  Result Value Ref Range Status   Specimen Description   Final    BLOOD RIGHT WRIST Performed at [facility], [address], [place], [place] [address]    Special Requests   Final    BOTTLES DRAWN AEROBIC AND ANAEROBIC  Blood Culture results may not be optimal due to an inadequate volume of blood received in culture bottles Performed at [facility], [address], [place], [place] [address]    Culture  Setup Time   Final    GRAM POSITIVE COCCI IN CLUSTERS IN BOTH AEROBIC AND ANAEROBIC BOTTLES CRITICAL VALUE NOTED.  VALUE IS CONSISTENT WITH PREVIOUSLY REPORTED AND CALLED VALUE.    Culture (A)  Final    STAPHYLOCOCCUS AUREUS SUSCEPTIBILITIES PERFORMED ON PREVIOUS CULTURE WITHIN THE LAST 5 DAYS. Performed at [facility], [address] [place], [place] [address]    Report Status 12/30/2018 FINAL  Final  Urine culture     Status: Abnormal   Collection Time: 12/28/18  7:47 AM   Specimen: In/Out Cath Urine  Result Value Ref Range Status   Specimen Description   Final    IN/OUT CATH URINE Performed at [facility], [address] [place], [place] [address]    Special Requests   Final    NONE Performed at [facility], [address], [place], [place] [address]    Culture (A)  Final    30,000 COLONIES/mL METHICILLIN RESISTANT STAPHYLOCOCCUS AUREUS   Report Status 12/30/2018 FINAL  Final   Organism ID, Bacteria METHICILLIN RESISTANT STAPHYLOCOCCUS AUREUS (A)   Final      Susceptibility   Methicillin resistant staphylococcus aureus - MIC*    CIPROFLOXACIN >=8 RESISTANT Resistant     GENTAMICIN <=0.5 SENSITIVE Sensitive     NITROFURANTOIN <=16 SENSITIVE Sensitive     OXACILLIN >=4 RESISTANT Resistant     TETRACYCLINE <=1 SENSITIVE Sensitive     VANCOMYCIN 1 SENSITIVE Sensitive     TRIMETH/SULFA <=10 SENSITIVE Sensitive     CLINDAMYCIN <=0.25 SENSITIVE Sensitive     RIFAMPIN <=0.5 SENSITIVE Sensitive     Inducible Clindamycin NEGATIVE Sensitive     * 30,000 COLONIES/mL METHICILLIN RESISTANT STAPHYLOCOCCUS AUREUS  Blood Culture ID Panel (Reflexed)     Status: Abnormal   Collection Time: 12/28/18  7:47 AM  Result Value Ref Range Status   Enterococcus species NOT DETECTED NOT DETECTED Final   Listeria monocytogenes NOT DETECTED NOT DETECTED Final   Staphylococcus species DETECTED (A) NOT DETECTED Final    Comment: CRITICAL RESULT CALLED TO, READ BACK BY AND VERIFIED WITH: B GREEN,PHARMD AT 2206 12/28/2018 BY L BENFIELD    Staphylococcus aureus (BCID) DETECTED (A) NOT DETECTED Final    Comment: Methicillin (oxacillin)-resistant Staphylococcus aureus (MRSA). MRSA is predictably resistant to beta-lactam antibiotics (except ceftaroline). Preferred therapy is vancomycin unless clinically contraindicated. Patient requires contact precautions if  hospitalized. CRITICAL RESULT CALLED TO, READ BACK BY AND VERIFIED WITH: B GREEN,PHARMD AT 2206 12/28/2018 BY L BENFIELD    Methicillin resistance DETECTED (A) NOT DETECTED Final    Comment: CRITICAL RESULT CALLED TO, READ BACK BY AND VERIFIED WITH: B GREEN,PHARMD AT 2206 12/28/2018 BY L BENFIELD    Streptococcus species NOT DETECTED NOT DETECTED Final   Streptococcus agalactiae NOT DETECTED NOT DETECTED Final   Streptococcus pneumoniae NOT DETECTED NOT DETECTED Final   Streptococcus pyogenes NOT DETECTED NOT DETECTED Final   Acinetobacter baumannii NOT DETECTED NOT DETECTED Final   Enterobacteriaceae  species NOT DETECTED NOT DETECTED Final   Enterobacter cloacae complex NOT DETECTED NOT DETECTED Final   Escherichia coli NOT DETECTED NOT DETECTED Final   Klebsiella oxytoca NOT DETECTED NOT DETECTED Final   Klebsiella pneumoniae NOT DETECTED NOT DETECTED Final   Proteus species NOT DETECTED  NOT DETECTED Final   Serratia marcescens NOT DETECTED NOT DETECTED Final   Haemophilus influenzae NOT DETECTED NOT DETECTED Final   Neisseria meningitidis NOT DETECTED NOT DETECTED Final   Pseudomonas aeruginosa NOT DETECTED NOT DETECTED Final   Candida albicans NOT DETECTED NOT DETECTED Final   Candida glabrata NOT DETECTED NOT DETECTED Final   Candida krusei NOT DETECTED NOT DETECTED Final   Candida parapsilosis NOT DETECTED NOT DETECTED Final   Candida tropicalis NOT DETECTED NOT DETECTED Final    Comment: Performed at [facility], [address] [place], [place] [address]  MRSA PCR Screening     Status: Abnormal   Collection Time: 12/28/18 12:51 PM   Specimen: Nasal Mucosa; Nasopharyngeal  Result Value Ref Range Status   MRSA by PCR POSITIVE (A) NEGATIVE Final    Comment:        The GeneXpert MRSA Assay (FDA approved for NASAL specimens only), is one component of a comprehensive MRSA colonization surveillance program. It is not intended to diagnose MRSA infection nor to guide or monitor treatment for MRSA infections. RESULT CALLED TO, READ BACK BY AND VERIFIED WITH: K ZULETA,RN 12/28/18 1507 RHOLMES Performed at [facility], [address] [place], [place] [address]   Respiratory Panel by PCR     Status: None   Collection Time: 12/28/18  1:59 PM   Specimen: Nasopharyngeal Swab; Respiratory  Result Value Ref Range Status   Adenovirus NOT DETECTED NOT DETECTED Final   Coronavirus 229E NOT DETECTED NOT DETECTED Final    Comment: (NOTE) The Coronavirus on the Respiratory Panel, DOES NOT test for the novel  Coronavirus (2019 nCoV)    Coronavirus HKU1 NOT  DETECTED NOT DETECTED Final   Coronavirus NL63 NOT DETECTED NOT DETECTED Final   Coronavirus OC43 NOT DETECTED NOT DETECTED Final   Metapneumovirus NOT DETECTED NOT DETECTED Final   Rhinovirus / Enterovirus NOT DETECTED NOT DETECTED Final   Influenza A NOT DETECTED NOT DETECTED Final   Influenza B NOT DETECTED NOT DETECTED Final   Parainfluenza Virus 1 NOT DETECTED NOT DETECTED Final   Parainfluenza Virus 2 NOT DETECTED NOT DETECTED Final   Parainfluenza Virus 3 NOT DETECTED NOT DETECTED Final   Parainfluenza Virus 4 NOT DETECTED NOT DETECTED Final   Respiratory Syncytial Virus NOT DETECTED NOT DETECTED Final   Bordetella pertussis NOT DETECTED NOT DETECTED Final   Chlamydophila pneumoniae NOT DETECTED NOT DETECTED Final   Mycoplasma pneumoniae NOT DETECTED NOT DETECTED Final    Comment: Performed at [facility], [address] [place], [place] [address]  SARS Coronavirus 2 by RT PCR (hospital order, performed in [facility] hospital lab) Nasopharyngeal Nasopharyngeal Swab     Status: None   Collection Time: 12/28/18  1:59 PM   Specimen: Nasopharyngeal Swab  Result Value Ref Range Status   SARS Coronavirus 2 NEGATIVE NEGATIVE Final    Comment: (NOTE) If result is NEGATIVE SARS-CoV-2 target nucleic acids are NOT DETECTED. The SARS-CoV-2 RNA is generally detectable in upper and lower  respiratory specimens during the acute phase of infection. The lowest  concentration of SARS-CoV-2 viral copies this assay can detect is 250  copies / mL. A negative result does not preclude SARS-CoV-2 infection  and should not be used as the sole basis for treatment or other  patient management decisions.  A negative result may occur with  improper specimen collection / handling, submission of specimen other  than nasopharyngeal swab, presence of viral mutation(s) within the  areas targeted by this assay, and inadequate number of viral copies  (<250 copies / mL). A negative result must be  combined with clinical  observations, patient history, and epidemiological information. If result is POSITIVE SARS-CoV-2 target nucleic acids are DETECTED. The SARS-CoV-2 RNA is generally detectable in upper and lower  respiratory specimens dur ing the acute phase of infection.  Positive  results are indicative of active infection with SARS-CoV-2.  Clinical  correlation with patient history and other diagnostic information is  necessary to determine patient infection status.  Positive results do  not rule out bacterial infection or co-infection with other viruses. If result is PRESUMPTIVE POSTIVE SARS-CoV-2 nucleic acids MAY BE PRESENT.   A presumptive positive result was obtained on the submitted specimen  and confirmed on repeat testing.  While 2019 novel coronavirus  (SARS-CoV-2) nucleic acids may be present in the submitted sample  additional confirmatory testing may be necessary for epidemiological  and / or clinical management purposes  to differentiate between  SARS-CoV-2 and other Sarbecovirus currently known to infect humans.  If clinically indicated additional testing with an alternate test  methodology [phone]) is advised. The SARS-CoV-2 RNA is generally  detectable in upper and lower respiratory sp ecimens during the acute  phase of infection. The expected result is Negative. Fact Sheet for Patients:  BoilerBrush.com.cy Fact Sheet for Healthcare Providers: https://pope.com/ This test is not yet approved or cleared by the [place] FDA and has been authorized for detection and/or diagnosis of SARS-CoV-2 by FDA under an Emergency Use Authorization (EUA).  This EUA will remain in effect (meaning this test can be used) for the duration of the COVID-19 declaration under Section 564(b)(1) of the Act, 21 U.S.C. section 360bbb-3(b)(1), unless the authorization is terminated or revoked sooner. Performed at [facility], [address], [place], [place] [address]   Culture, blood (Routine X 2) w Reflex to ID Panel     Status: None (Preliminary result)   Collection Time: 12/30/18  8:33 AM   Specimen: BLOOD RIGHT HAND  Result Value Ref Range Status   Specimen Description   Final    BLOOD RIGHT HAND Performed at [facility], [address] [place], [place] [address]    Special Requests   Final    BOTTLES DRAWN AEROBIC ONLY Blood Culture adequate volume Performed at [facility], [address] [place], [place] [address]    Culture   Final    NO GROWTH 3 DAYS Performed at [facility], [address] [place], [place] [address]    Report Status PENDING  Incomplete    Studies/Results: No results found.   Assessment/Plan: Staph aureus bacteremia, MRSA TV IE IVDA ETOH abuse  Total days of antibiotics:5vanco  appreciate CVTS eval.  Cr stable vanco tr in range Repeat BCx 10-20 are ngtd x 3 days.  Could consider changing to po anbx after 14 days of IV. I am not sure of his compliance (he states he will take). Placement as able.  ID available as needed over w/e.           Johny Sax MD, FACP Infectious Diseases (pager) [phone] [email or web address] 01/02/2019, 7:59 AM  LOS: 5 days

## 2019-01-03 LAB — COMPREHENSIVE METABOLIC PANEL
ALT: 35 U/L (ref 0–44)
AST: 22 U/L (ref 15–41)
Albumin: 2.4 g/dL — ABNORMAL LOW (ref 3.5–5.0)
Alkaline Phosphatase: 150 U/L — ABNORMAL HIGH (ref 38–126)
Anion gap: 8 (ref 5–15)
BUN: 8 mg/dL (ref 6–20)
CO2: 26 mmol/L (ref 22–32)
Calcium: 8.5 mg/dL — ABNORMAL LOW (ref 8.9–10.3)
Chloride: 106 mmol/L (ref 98–111)
Creatinine, Ser: 0.51 mg/dL — ABNORMAL LOW (ref 0.61–1.24)
GFR calc Af Amer: >60 mL/min (ref >60)
GFR calc non Af Amer: >60 mL/min (ref >60)
Glucose, Bld: 122 mg/dL — ABNORMAL HIGH (ref 70–99)
Potassium: 3.9 mmol/L (ref 3.5–5.1)
Sodium: 140 mmol/L (ref 135–145)
Total Bilirubin: 0.2 mg/dL — ABNORMAL LOW (ref 0.3–1.2)
Total Protein: 6.4 g/dL — ABNORMAL LOW (ref 6.5–8.1)

## 2019-01-03 LAB — CBC WITH DIFFERENTIAL/PLATELET
Abs Immature Granulocytes: 0.06 10*3/uL (ref 0.00–0.07)
Basophils Absolute: 0.1 10*3/uL (ref 0.0–0.1)
Basophils Relative: 1 %
Eosinophils Absolute: 0.3 10*3/uL (ref 0.0–0.5)
Eosinophils Relative: 3 %
HCT: 31 % — ABNORMAL LOW (ref 39.0–52.0)
Hemoglobin: 10.1 g/dL — ABNORMAL LOW (ref 13.0–17.0)
Immature Granulocytes: 1 %
Lymphocytes Relative: 27 %
Lymphs Abs: 2.7 10*3/uL (ref 0.7–4.0)
MCH: 29.2 pg (ref 26.0–34.0)
MCHC: 32.6 g/dL (ref 30.0–36.0)
MCV: 89.6 fL (ref 80.0–100.0)
Monocytes Absolute: 0.9 10*3/uL (ref 0.1–1.0)
Monocytes Relative: 9 %
Neutro Abs: 6.3 10*3/uL (ref 1.7–7.7)
Neutrophils Relative %: 59 %
Platelets: 395 10*3/uL (ref 150–400)
RBC: 3.46 MIL/uL — ABNORMAL LOW (ref 4.22–5.81)
RDW: 13.1 % (ref 11.5–15.5)
WBC: 10.3 10*3/uL (ref 4.0–10.5)
nRBC: 0 % (ref 0.0–0.2)

## 2019-01-03 LAB — MAGNESIUM: Magnesium: 1.9 mg/dL (ref 1.7–2.4)

## 2019-01-03 LAB — PHOSPHORUS: Phosphorus: 4.4 mg/dL (ref 2.5–4.6)

## 2019-01-03 MED ORDER — CHLORHEXIDINE GLUCONATE CLOTH 2 % EX PADS
6.0000 | MEDICATED_PAD | Freq: Every day | CUTANEOUS | Status: DC
  Administered 2019-01-03 – 2019-01-06 (×4): 6 via TOPICAL

## 2019-01-03 NOTE — Progress Notes (Signed)
PROGRESS NOTE    VED MARTOS  ZOX:[id number] DOB: 11-Sep-1985 DOA: 12/28/2018 PCP: Alex [name], No Pcp Per   Brief Narrative:  Alex [name] is a 33 year old thin Caucasian male with a past medical history significant for but not limited to alcohol abuse, polysubstance abuse, asthma and other comorbidities who presented to the hospital with chief complaint of generalized body aches and chills.  Uses IV heroin and methamphetamines and last meth use was about a week ago and heroin use was prior to ED arrival.  In the ED he is noted to be febrile on septic without unknown etiology and UDS was positive for amphetamines and opiates.  Blood cultures were positive for MRSA and started on vancomycin.  Further work-up reveals that he has a tricuspid endocarditis with severe tricuspid insufficiency.  Blood cultures have been repeated on 10/20/202 and show NGTD at 3 Days.  We will have cardiothoracic surgery evaluate for further evaluation and recommendations and Dr. Laneta Simmers does not feel there is indication for surgery at this time.  Alex [name] will need long-term antibiotics and ID is recommending at least 14 days of IV vancomycin and considering changing to p.o. Alex [name] still complaining of some back pain and is open to the idea of maybe trying Suboxone eventually.  Today the Alex [name] was resting and wanting to sleep.  Assessment & Plan:   Principal Problem:   Sepsis (HCC) Active Problems:   Tobacco use disorder   Polysubstance dependence (HCC)   Substance induced mood disorder (HCC)   IVDU (intravenous drug user)   Endocarditis of tricuspid valve  Sepsis without shock, likely from IV Drug Use MRSA bacteremia from Tricuspid Endocarditis  AMS in the setting of withdrawal and Bacteremia, Improved  -Mentation is greatly improved but was severely agitated and per nursing keeps getting out of the bed and yelling at the Nursing staff but was calm with me yesterday and was calm today  -Ordered Echocardiogram to rule  out infective endocarditis and was normal but TEE showed Tricuspid Valve Endocarditis and Severe Tricuspid Insuffiencey  -CRP was 19.6 and LA was 1.3 -Continue IV Vancomycin-Day 6; Monitor Renal Function closely and Vancomycin peak was 15 and trough was 8 yesterday with a creatinine of 0.51 today -Repeat surveillance cultures show NGTD at 4 Days  -Leukocytosis is now 10.6 and mildly elevated -Discontinued Foley -Resumed Regular Diet and Alex [name] was on IV fluid hydration with normal saline rate of 100 mL/hr and it was reduced to 50 mL/hr and nw stopped  -ID following and Appreciate further evaluation and recommendations recommending cardiothoracic surgery evaluation I have reached out to the [facility] for formal evaluation given his tricuspid endocarditis along with severe tricuspid valve insufficiency and Dr. Laneta Simmers does not feel there is any indication for surgery at this point -Infectious diseases recommending continue IV antibiotics with vancomycin and changing to p.o. antibiotics after 14 days possibly if he is compliant  Hyponatremia, improved -Resolved with IVF Hydration and is now 140 -IVF hydration has now stopped  -Continue to Monitor and Trend -Repeat CMP in AM   Transaminitis/Abnormal LFTs -AST was 57 and ALT was 57 and now improved as AST is 22 and ALT is 35 -Checked RUQ U/S and was read as a normal study -Continue to Monitor and Trend -Repeat CMP in AM   Polysubstance Abuse with concerns of Active Withdrawal Tobacco Use -C/w Withdrawal Protocol with IV Lorazepam 1-4 mg q1hprn and C/w IV Lorazepam 2 mg q4hprn Agitation -Continue Chlordiazepoxide 10 mg po TID D to help with his  withdrawal -May consider starting Suboxone but will need a washout. 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 intakes  Normocytic Anemia -Alex [name]'s Hb/HCt is now 10.1/31.0 -Checked Anemia Panel and showed iron level of 16, U IBC of 168, TIBC of 184, saturation ratios of 9%, ferritin level 208, folate level of 8.9, vitamin B12 level 436 -Started the Alex [name] on iron polysaccharides 150 mg p.o. daily -Continue to Monitor for S/Sx of Bleeding -Repeat CBC in AM    Tricuspid Insuffiencey  -Seen on TEE and was noted to be Severe -Dr. Royann Shivers recommended treating Endocarditis and re-evaluating with Transthoracic ECHO after completion of IV Abx -We will have Cardiothoracic surgery evaluate and currently there is no indication for surgery at this point -C/w IV Abx and Treatment Course for Endocarditis   Hyperglycemia in the setting of Pre-Diabetes -Initially thought to be Reactive in the setting of infection -Checked HbA1c and was 6.1 -Blood Sugars have been ranging from 89-146 on Daily BMP/CMP -Continue to Monitor Blood Sugars carefully and if necessary will place on Sensitive Novolog SSI AC  Hypokalemia, improved -Alex [name]'s K+ this AM was 3.9 -Mag Level was 1.9 -Continue to Monitor and Replete as Necessary -Repeat CMP in AM   DVT prophylaxis: Enoxaparin 40 mg sq q24h Code Status: FULL CODE  Family Communication: No Family present  at bedside  Disposition Plan: Pending further improvement and Clearance by ID  Consultants:   Cardiology for TEE  Infectious Diseases   PCCM/Pulmonary   Cardiothoraic Surgery notified    Procedures:  ECHOCARDIOGRAM 12/30/2018 IMPRESSIONS    1. Left ventricular ejection fraction, by visual estimation, is 60 to 65%. The left ventricle has normal function. Normal left ventricular size. There is no left ventricular hypertrophy.  2. Global right ventricle has normal systolic function.The right ventricular size is normal. No increase in right ventricular wall thickness.  3. Left atrial size was normal.  4. Right atrial size was normal.  5. The mitral valve is normal in structure. No evidence of mitral valve regurgitation. No evidence of mitral stenosis.  6. Thickened TV leaflet with possible mobile vegetation. ricuspid valve regurgitation is mild.  7. AV is poorly visualized but concerning in the parasternal long axis view for possible vegetation on the ventricular side of AV. Aortic valve regurgitation was not visualized by color flow Doppler.  8. The pulmonic valve was normal in structure. Pulmonic valve regurgitation is not visualized by color flow Doppler.  9. Normal pulmonary artery systolic pressure. 10. The inferior vena cava is normal in size with greater than 50% respiratory variability, suggesting right atrial pressure of 3 mmHg. 11. Recommend TEE for further evaluation for possible vegetation on AV and TV.  FINDINGS  Left Ventricle: Left ventricular ejection fraction, by visual estimation, is 60 to 65%. The left ventricle has normal function. There is no left ventricular hypertrophy. Normal left ventricular size.  Right Ventricle: The right ventricular size is normal. No increase in right ventricular wall thickness. Global RV systolic function is has normal systolic function. The tricuspid regurgitant velocity is 2.13 m/s, and with an assumed right atrial pressure  of 3  mmHg, the estimated right ventricular systolic pressure is normal at 21.1 mmHg.  Left Atrium: Left atrial size was normal in size.  Right Atrium: Right atrial size was normal in size  Pericardium: There is no evidence of pericardial effusion.  Mitral Valve: The mitral valve is normal in structure. No evidence of mitral valve stenosis by observation. No evidence of mitral valve regurgitation.  Tricuspid Valve:Tricuspid valve regurgitation is mild by color flow Doppler. Thickened TV leaflet with possible mobile vegetation.    Aortic Valve: The aortic valve was not well visualized. Aortic valve regurgitation was not visualized by color flow Doppler. The aortic valve is structurally normal, with no evidence of sclerosis or stenosis. AV is poorly visualized but concerning in the  parasternal long axis view for possible vegetation on the ventricular side of AV.  Pulmonic Valve: The pulmonic valve was normal in structure. Pulmonic valve regurgitation is not visualized by color flow Doppler.  Aorta: The aortic root, ascending aorta and aortic arch are all structurally normal, with no evidence of dilitation or obstruction.  Venous: The inferior vena cava is normal in size with greater than 50% respiratory variability, suggesting right atrial pressure of 3 mmHg.  IAS/Shunts: No atrial level shunt detected by color flow Doppler. No ventricular septal defect is seen or detected. There is no evidence of an atrial septal defect.     LEFT VENTRICLE PLAX 2D LVIDd:         4.20 cm  Diastology LVIDs:         2.90 cm  LV e' lateral:   12.40 cm/s LV PW:         1.20 cm  LV E/e' lateral: 4.2 LV IVS:        1.10 cm  LV e' medial:    16.20 cm/s LVOT diam:     2.20 cm  LV E/e' medial:  3.2 LV SV:         46 ml LV SV Index:   28.28 LVOT Area:     3.80 cm    RIGHT VENTRICLE RV S prime:     14.10 cm/s TAPSE (M-mode): 2.6 cm  LEFT ATRIUM             Index LA diam:        2.80 cm 1.63  cm/m LA Vol (A2C):   23.8 ml 13.89 ml/m LA Vol (A4C):   34.9 ml 20.37 ml/m LA Biplane Vol: 31.1 ml 18.16 ml/m  AORTIC VALVE LVOT Vmax:   84.90 cm/s LVOT Vmean:  52.200 cm/s LVOT VTI:    0.126 m   AORTA Ao Asc diam: 2.90 cm  MITRAL VALVE                        TRICUSPID VALVE MV Area (PHT): 4.63 cm             TR Peak grad:   18.1 mmHg MV PHT:        47.56 msec           TR Vmax:  216.00 cm/s MV Decel Time: 164 msec MV E velocity: 52.30 cm/s 103 cm/s  SHUNTS MV A velocity: 42.00 cm/s 70.3 cm/s Systemic VTI:  0.13 m MV E/A ratio:  1.25       1.5       Systemic Diam: 2.20 cm  TEE 12/31/2018 IMPRESSIONS    1. Left ventricular ejection fraction, by visual estimation, is 60 to 65%. The left ventricle has normal function. Normal left ventricular size. There is no left ventricular hypertrophy.  2. Global right ventricle has normal systolic function.The right ventricular size is normal. No increase in right ventricular wall thickness.  3. Left atrial size was normal.  4. Right atrial size was normal.  5. The mitral valve is normal in structure. Trace mitral valve regurgitation. No evidence of mitral stenosis.  6. Small vegetation on the tricuspid valve. There is a probable perforation of the anterior leaflet, adjacent to the vegetation.  7. The tricuspid valve is normal in structure. Tricuspid valve regurgitation severe.  8. The aortic valve is normal in structure. Aortic valve regurgitation was not visualized by color flow Doppler. Structurally normal aortic valve, with no evidence of sclerosis or stenosis.  9. The pulmonic valve was normal in structure. Pulmonic valve regurgitation is not visualized by color flow Doppler. 10. Mildly elevated pulmonary artery systolic pressure.  FINDINGS  Left Ventricle: Left ventricular ejection fraction, by visual estimation, is 60 to 65%. The left ventricle has normal function. No evidence of left ventricular regional wall motion  abnormalities. There is no left ventricular hypertrophy. Normal left  ventricular size.  Right Ventricle: The right ventricular size is normal. No increase in right ventricular wall thickness. Global RV systolic function is has normal systolic function. The tricuspid regurgitant velocity is 2.65 m/s, and with an assumed right atrial pressure  of 8 mmHg, the estimated right ventricular systolic pressure is mildly elevated at 36.1 mmHg.  Left Atrium: Left atrial size was normal in size.  Right Atrium: Right atrial size was normal in size  Pericardium: There is no evidence of pericardial effusion.  Mitral Valve: The mitral valve is normal in structure. No evidence of mitral valve stenosis by observation. Trace mitral valve regurgitation. There is no evidence of mitral valve vegetation.  Tricuspid Valve: The tricuspid valve is normal in structure. Tricuspid valve regurgitation severe by color flow Doppler. There is a small non-mobile tricuspid valve vegetation on the anterior leaflet. The TV vegetation measures 5 mm x 7 mm. There appears  to be a 3 mm diameter perforation in the anterior leaflet.  Aortic Valve: The aortic valve is normal in structure. Aortic valve regurgitation was not visualized by color flow Doppler. The aortic valve is structurally normal, with no evidence of sclerosis or stenosis.  Pulmonic Valve: The pulmonic valve was normal in structure. Pulmonic valve regurgitation is not visualized by color flow Doppler.  Aorta: The aortic root, ascending aorta and aortic arch are all structurally normal, with no evidence of dilitation or obstruction.  Shunts: No ventricular septal defect is seen or detected. There is no evidence of an atrial septal defect. No atrial level shunt detected by color flow Doppler.    TRICUSPID VALVE             Normals TR Peak grad:   28.1 mmHg TR Vmax:        265.00 cm/s 288 cm/s  Antimicrobials:  Anti-infectives (From admission, onward)    Start     Dose/Rate Route Frequency Ordered Stop  01/02/19 2000  vancomycin (VANCOCIN) 1,750 mg in sodium chloride 0.9 % 500 mL IVPB     1,750 mg 250 mL/hr over 120 Minutes Intravenous Every 12 hours 01/02/19 1227     12/31/18 1000  vancomycin (VANCOCIN) 1,250 mg in sodium chloride 0.9 % 250 mL IVPB  Status:  Discontinued     1,250 mg 166.7 mL/hr over 90 Minutes Intravenous Every 12 hours 12/31/18 0929 01/02/19 1227   12/28/18 1800  vancomycin (VANCOCIN) IVPB 1000 mg/200 mL premix  Status:  Discontinued     1,000 mg 200 mL/hr over 60 Minutes Intravenous 2 times daily 12/28/18 0914 12/31/18 0929   12/28/18 1400  ceFEPIme (MAXIPIME) 2 g in sodium chloride 0.9 % 100 mL IVPB  Status:  Discontinued     2 g 200 mL/hr over 30 Minutes Intravenous Every 8 hours 12/28/18 0914 12/29/18 1542   12/28/18 0730  vancomycin (VANCOCIN) 1,250 mg in sodium chloride 0.9 % 250 mL IVPB     1,250 mg 166.7 mL/hr over 90 Minutes Intravenous STAT 12/28/18 0715 12/28/18 0935   12/28/18 0730  ceFEPIme (MAXIPIME) 2 g in sodium chloride 0.9 % 100 mL IVPB     2 g 200 mL/hr over 30 Minutes Intravenous STAT 12/28/18 0715 12/28/18 0833     Subjective: Seen and examined at bedside and he was sleepy and wanting to rest.  Allowed me to examine him and then asked me to switch off the light.  No nausea or vomiting.  No other concerns at this time.  Nursing reported later that his mews score turned yellow but is a slight elevation in his heart rate as he was up and moving.  No other concerns or complaints at this time.  Objective: Vitals:   01/02/19 1327 01/02/19 2200 01/03/19 0448 01/03/19 1037  BP: 123/78 121/73 103/65 119/77  Pulse: 88 96 80 (!) 102  Resp: 18 12 14 13   Temp: 97.8 F (36.6 C) 98.2 F (36.8 C) 98.4 F (36.9 C) 97.9 F (36.6 C)  TempSrc: Oral Oral Oral Oral  SpO2: 99% 99% 97% 99%  Weight:      Height:        Intake/Output Summary (Last 24 hours) at 01/03/2019 1323 Last data filed at 01/03/2019  1048 Gross per 24 hour  Intake 2179.17 ml  Output 3200 ml  Net -1020.83 ml   Filed Weights   12/28/18 0040 12/28/18 1254  Weight: 56.7 kg 54.3 kg   Examination: Physical Exam:  Constitutional: Thin Caucasian male in NAD and appears calm and comfortable Eyes: Lids and conjunctivae normal, sclerae anicteric  ENMT: External Ears, Nose appear normal. Grossly normal hearing. Mucous membranes are moist.  Neck: Appears normal, supple, no cervical masses, normal ROM, no appreciable thyromegaly; no JVD Respiratory: Diminished to auscultation bilaterally, no wheezing, rales, rhonchi or crackles. Normal respiratory effort and Alex [name] is not tachypenic. No accessory muscle use. Unlabored breathing  Cardiovascular: RRR, no murmurs / rubs / gallops. S1 and S2 auscultated. No extremity edema.  Abdomen: Soft, non-tender, non-distended. Bowel sounds positive x4.  GU: Deferred. Musculoskeletal: No clubbing / cyanosis of digits/nails. No joint deformity upper and lower extremities. Skin: No rashes, lesions, ulcers on a limited skin evaluation but has some tattoos and some tract marks on arms from injecting. No induration; Warm and dry.  Neurologic: CN 2-12 grossly intact with no focal deficits.. Romberg sign and cerebellar reflexes not assessed.  Psychiatric: Normal judgment and insight. Alert and oriented x 3. Normal mood and appropriate affect.  Data Reviewed: I have personally reviewed following labs and imaging studies  CBC: Recent Labs  Lab 12/28/18 0630 12/29/18 0533 12/31/18 0439 01/01/19 0342 01/02/19 0448 01/03/19 0334  WBC 13.3* 14.8* 10.2 10.0 10.6* 10.3  NEUTROABS 11.1*  --   --  6.9 6.1 6.3  HGB 11.8* 12.2* 10.9* 9.8* 10.6* 10.1*  HCT 35.7* 35.5* 32.7* 29.8* 32.8* 31.0*  MCV 89.0 85.5 87.9 88.4 90.4 89.6  PLT 208 178 239 265 338 395   Basic Metabolic Panel: Recent Labs  Lab 12/29/18 0609 12/29/18 0920 12/31/18 0439 01/01/19 0342 01/02/19 0448 01/03/19 0334  NA 139  --   139 138 137 140  K 3.7  --  3.1* 3.5 3.9 3.9  CL 110  --  108 106 105 106  CO2 18*  --  GLUCOSE 89  --  146* 125* 94 122*  BUN 9  --  7 5* 9 8  CREATININE 0.62  --  0.50* 0.51* 0.56* 0.51*  CALCIUM 8.4*  --  7.9* 8.2* 8.3* 8.5*  MG  --  1.9 1.9 1.9 1.8 1.9  PHOS  --  3.9  --  3.3 3.5 4.4   GFR: Estimated Creatinine Clearance: 100.9 mL/min (A) (by C-G formula based on SCr of 0.51 mg/dL (L)). Liver Function Tests: Recent Labs  Lab 12/28/18 0630 12/29/18 0609 01/01/19 0342 01/02/19 0448 01/03/19 0334  AST 57* ALT 57* 36 27 30 35  ALKPHOS 231* 166* 151* 155* 150*  BILITOT 0.8 0.7 0.4 0.2* 0.2*  PROT 7.0 6.0* 5.9* 6.2* 6.4*  ALBUMIN 2.9* 2.5* 2.2* 2.2* 2.4*   No results for input(s): LIPASE, AMYLASE in the last 168 hours. No results for input(s): AMMONIA in the last 168 hours. Coagulation Profile: Recent Labs  Lab 12/28/18 0747  INR 1.2   Cardiac Enzymes: Recent Labs  Lab 12/28/18 0630  CKTOTAL 52   BNP (last 3 results) No results for input(s): PROBNP in the last 8760 hours. HbA1C: Recent Labs    01/01/19 0342  HGBA1C 6.1*   CBG: Recent Labs  Lab 01/01/19 1229 01/01/19 1854 01/02/19 0004 01/02/19 0533 01/02/19 1313  GLUCAP 101* 107* 126* 95 103*   Lipid Profile: No results for input(s): CHOL, HDL, LDLCALC, TRIG, CHOLHDL, LDLDIRECT in the last 72 hours. Thyroid Function Tests: No results for input(s): TSH, T4TOTAL, FREET4, T3FREE, THYROIDAB in the last 72 hours. Anemia Panel: Recent Labs    01/01/19 0342  VITAMINB12 436  FOLATE 8.9  FERRITIN 208  TIBC 184*  IRON 16*  RETICCTPCT 0.4   Sepsis Labs: Recent Labs  Lab 12/28/18 0747 12/28/18 1026  LATICACIDVEN 1.0 1.3    Recent Results (from the past 240 hour(s))  SARS CORONAVIRUS 2 (TAT 6-24 HRS) Nasopharyngeal Nasopharyngeal Swab     Status: None   Collection Time: 12/28/18  5:37 AM   Specimen: Nasopharyngeal Swab  Result Value Ref Range Status   SARS Coronavirus  2 NEGATIVE NEGATIVE Final    Comment: (NOTE) SARS-CoV-2 target nucleic acids are NOT DETECTED. The SARS-CoV-2 RNA is generally detectable in upper and lower respiratory specimens during the acute phase of infection. Negative results do not preclude SARS-CoV-2 infection, do not rule out co-infections with other pathogens, and should not be used as the sole basis for treatment or other Alex [name] management decisions. Negative results must be combined with clinical observations, Alex [name] history, and epidemiological information. The expected result is Negative. Fact Sheet for Patients: HairSlick.no Fact Sheet  for Healthcare Providers: quierodirigir.comhttps://www.fda.gov/media/138095/download This test is not yet approved or cleared by the [place] FDA and  has been authorized for detection and/or diagnosis of SARS-CoV-2 by FDA under an Emergency Use Authorization (EUA). This EUA will remain  in effect (meaning this test can be used) for the duration of the COVID-19 declaration under Section 56 4(b)(1) of the Act, 21 U.S.C. section 360bbb-3(b)(1), unless the authorization is terminated or revoked sooner. Performed at [facility], [address] [place], [place] [address]   Blood culture (routine x 2)     Status: Abnormal   Collection Time: 12/28/18  7:47 AM   Specimen: BLOOD RIGHT ARM  Result Value Ref Range Status   Specimen Description   Final    BLOOD RIGHT ARM Performed at [facility], [address] [place], [place] [address]    Special Requests   Final    BOTTLES DRAWN AEROBIC AND ANAEROBIC Blood Culture results may not be optimal due to an excessive volume of blood received in culture bottles Performed at [facility], [address] [place], [place] [address]    Culture  Setup Time   Final    GRAM POSITIVE COCCI IN BOTH AEROBIC AND ANAEROBIC BOTTLES CRITICAL RESULT CALLED TO, READ BACK BY AND VERIFIED WITH: PHARMD JUSTIN  L 1948 [id number] [facility] CRITICAL RESULT CALLED TO, READ BACK BY AND VERIFIED WITH: B GREEN,PHARMD AT 2206 12/28/2018 BY L BENFIELD Performed at [facility], [address] [place], [place] [address]    Culture METHICILLIN RESISTANT STAPHYLOCOCCUS AUREUS (A)  Final   Report Status 12/30/2018 FINAL  Final   Organism ID, Bacteria METHICILLIN RESISTANT STAPHYLOCOCCUS AUREUS  Final      Susceptibility   Methicillin resistant staphylococcus aureus - MIC*    CIPROFLOXACIN >=8 RESISTANT Resistant     ERYTHROMYCIN >=8 RESISTANT Resistant     GENTAMICIN <=0.5 SENSITIVE Sensitive     OXACILLIN >=4 RESISTANT Resistant     TETRACYCLINE <=1 SENSITIVE Sensitive     VANCOMYCIN <=0.5 SENSITIVE Sensitive     TRIMETH/SULFA <=10 SENSITIVE Sensitive     CLINDAMYCIN <=0.25 SENSITIVE Sensitive     RIFAMPIN <=0.5 SENSITIVE Sensitive     Inducible Clindamycin NEGATIVE Sensitive     * METHICILLIN RESISTANT STAPHYLOCOCCUS AUREUS  Blood culture (routine x 2)     Status: Abnormal   Collection Time: 12/28/18  7:47 AM   Specimen: BLOOD  Result Value Ref Range Status   Specimen Description   Final    BLOOD RIGHT WRIST Performed at [facility], [address] [place], [place] [address]    Special Requests   Final    BOTTLES DRAWN AEROBIC AND ANAEROBIC Blood Culture results may not be optimal due to an inadequate volume of blood received in culture bottles Performed at [facility], [address] [place], [place] [address]    Culture  Setup Time   Final    GRAM POSITIVE COCCI IN CLUSTERS IN BOTH AEROBIC AND ANAEROBIC BOTTLES CRITICAL VALUE NOTED.  VALUE IS CONSISTENT WITH PREVIOUSLY REPORTED AND CALLED VALUE.    Culture (A)  Final    STAPHYLOCOCCUS AUREUS SUSCEPTIBILITIES PERFORMED ON PREVIOUS CULTURE WITHIN THE LAST 5 DAYS. Performed at [facility], [address] [place], [place] [address]    Report Status 12/30/2018 FINAL  Final  Urine culture      Status: Abnormal   Collection Time: 12/28/18  7:47 AM  Specimen: In/Out Cath Urine  Result Value Ref Range Status   Specimen Description   Final    IN/OUT CATH URINE Performed at [facility], [address], [place], [place] [address]    Special Requests   Final    NONE Performed at [facility], [address], [place], [place] [address]    Culture (A)  Final    30,000 COLONIES/mL METHICILLIN RESISTANT STAPHYLOCOCCUS AUREUS   Report Status 12/30/2018 FINAL  Final   Organism ID, Bacteria METHICILLIN RESISTANT STAPHYLOCOCCUS AUREUS (A)  Final      Susceptibility   Methicillin resistant staphylococcus aureus - MIC*    CIPROFLOXACIN >=8 RESISTANT Resistant     GENTAMICIN <=0.5 SENSITIVE Sensitive     NITROFURANTOIN <=16 SENSITIVE Sensitive     OXACILLIN >=4 RESISTANT Resistant     TETRACYCLINE <=1 SENSITIVE Sensitive     VANCOMYCIN 1 SENSITIVE Sensitive     TRIMETH/SULFA <=10 SENSITIVE Sensitive     CLINDAMYCIN <=0.25 SENSITIVE Sensitive     RIFAMPIN <=0.5 SENSITIVE Sensitive     Inducible Clindamycin NEGATIVE Sensitive     * 30,000 COLONIES/mL METHICILLIN RESISTANT STAPHYLOCOCCUS AUREUS  Blood Culture ID Panel (Reflexed)     Status: Abnormal   Collection Time: 12/28/18  7:47 AM  Result Value Ref Range Status   Enterococcus species NOT DETECTED NOT DETECTED Final   Listeria monocytogenes NOT DETECTED NOT DETECTED Final   Staphylococcus species DETECTED (A) NOT DETECTED Final    Comment: CRITICAL RESULT CALLED TO, READ BACK BY AND VERIFIED WITH: B GREEN,PHARMD AT 2206 12/28/2018 BY L BENFIELD    Staphylococcus aureus (BCID) DETECTED (A) NOT DETECTED Final    Comment: Methicillin (oxacillin)-resistant Staphylococcus aureus (MRSA). MRSA is predictably resistant to beta-lactam antibiotics (except ceftaroline). Preferred therapy is vancomycin unless clinically contraindicated. Alex [name] requires contact precautions if  hospitalized. CRITICAL  RESULT CALLED TO, READ BACK BY AND VERIFIED WITH: B GREEN,PHARMD AT 2206 12/28/2018 BY L BENFIELD    Methicillin resistance DETECTED (A) NOT DETECTED Final    Comment: CRITICAL RESULT CALLED TO, READ BACK BY AND VERIFIED WITH: B GREEN,PHARMD AT 2206 12/28/2018 BY L BENFIELD    Streptococcus species NOT DETECTED NOT DETECTED Final   Streptococcus agalactiae NOT DETECTED NOT DETECTED Final   Streptococcus pneumoniae NOT DETECTED NOT DETECTED Final   Streptococcus pyogenes NOT DETECTED NOT DETECTED Final   Acinetobacter baumannii NOT DETECTED NOT DETECTED Final   Enterobacteriaceae species NOT DETECTED NOT DETECTED Final   Enterobacter cloacae complex NOT DETECTED NOT DETECTED Final   Escherichia coli NOT DETECTED NOT DETECTED Final   Klebsiella oxytoca NOT DETECTED NOT DETECTED Final   Klebsiella pneumoniae NOT DETECTED NOT DETECTED Final   Proteus species NOT DETECTED NOT DETECTED Final   Serratia marcescens NOT DETECTED NOT DETECTED Final   Haemophilus influenzae NOT DETECTED NOT DETECTED Final   Neisseria meningitidis NOT DETECTED NOT DETECTED Final   Pseudomonas aeruginosa NOT DETECTED NOT DETECTED Final   Candida albicans NOT DETECTED NOT DETECTED Final   Candida glabrata NOT DETECTED NOT DETECTED Final   Candida krusei NOT DETECTED NOT DETECTED Final   Candida parapsilosis NOT DETECTED NOT DETECTED Final   Candida tropicalis NOT DETECTED NOT DETECTED Final    Comment: Performed at [facility], [address] [place], [place] [address]  MRSA PCR Screening     Status: Abnormal   Collection Time: 12/28/18 12:51 PM   Specimen: Nasal Mucosa; Nasopharyngeal  Result Value Ref Range Status  MRSA by PCR POSITIVE (A) NEGATIVE Final    Comment:        The GeneXpert MRSA Assay (FDA approved for NASAL specimens only), is one component of a comprehensive MRSA colonization surveillance program. It is not intended to diagnose MRSA infection nor to guide or monitor treatment for  MRSA infections. RESULT CALLED TO, READ BACK BY AND VERIFIED WITH: K ZULETA,RN 12/28/18 1507 RHOLMES Performed at [facility], [address], [place], [place] [address]   Respiratory Panel by PCR     Status: None   Collection Time: 12/28/18  1:59 PM   Specimen: Nasopharyngeal Swab; Respiratory  Result Value Ref Range Status   Adenovirus NOT DETECTED NOT DETECTED Final   Coronavirus 229E NOT DETECTED NOT DETECTED Final    Comment: (NOTE) The Coronavirus on the Respiratory Panel, DOES NOT test for the novel  Coronavirus (2019 nCoV)    Coronavirus HKU1 NOT DETECTED NOT DETECTED Final   Coronavirus NL63 NOT DETECTED NOT DETECTED Final   Coronavirus OC43 NOT DETECTED NOT DETECTED Final   Metapneumovirus NOT DETECTED NOT DETECTED Final   Rhinovirus / Enterovirus NOT DETECTED NOT DETECTED Final   Influenza A NOT DETECTED NOT DETECTED Final   Influenza B NOT DETECTED NOT DETECTED Final   Parainfluenza Virus 1 NOT DETECTED NOT DETECTED Final   Parainfluenza Virus 2 NOT DETECTED NOT DETECTED Final   Parainfluenza Virus 3 NOT DETECTED NOT DETECTED Final   Parainfluenza Virus 4 NOT DETECTED NOT DETECTED Final   Respiratory Syncytial Virus NOT DETECTED NOT DETECTED Final   Bordetella pertussis NOT DETECTED NOT DETECTED Final   Chlamydophila pneumoniae NOT DETECTED NOT DETECTED Final   Mycoplasma pneumoniae NOT DETECTED NOT DETECTED Final    Comment: Performed at [facility], [address], [place], [place] [address]  SARS Coronavirus 2 by RT PCR (hospital order, performed in [facility] hospital lab) Nasopharyngeal Nasopharyngeal Swab     Status: None   Collection Time: 12/28/18  1:59 PM   Specimen: Nasopharyngeal Swab  Result Value Ref Range Status   SARS Coronavirus 2 NEGATIVE NEGATIVE Final    Comment: (NOTE) If result is NEGATIVE SARS-CoV-2 target nucleic acids are NOT DETECTED. The SARS-CoV-2 RNA is generally detectable in upper and lower  respiratory  specimens during the acute phase of infection. The lowest  concentration of SARS-CoV-2 viral copies this assay can detect is 250  copies / mL. A negative result does not preclude SARS-CoV-2 infection  and should not be used as the sole basis for treatment or other  Alex [name] management decisions.  A negative result may occur with  improper specimen collection / handling, submission of specimen other  than nasopharyngeal swab, presence of viral mutation(s) within the  areas targeted by this assay, and inadequate number of viral copies  (<250 copies / mL). A negative result must be combined with clinical  observations, Alex [name] history, and epidemiological information. If result is POSITIVE SARS-CoV-2 target nucleic acids are DETECTED. The SARS-CoV-2 RNA is generally detectable in upper and lower  respiratory specimens dur ing the acute phase of infection.  Positive  results are indicative of active infection with SARS-CoV-2.  Clinical  correlation with Alex [name] history and other diagnostic information is  necessary to determine Alex [name] infection status.  Positive results do  not rule out bacterial infection or co-infection with other viruses. If result is PRESUMPTIVE POSTIVE SARS-CoV-2 nucleic acids MAY BE PRESENT.   A presumptive positive result was obtained on the submitted specimen  and confirmed on repeat testing.  While 2019 novel coronavirus  (SARS-CoV-2) nucleic acids may be present in the submitted sample  additional confirmatory testing may be necessary for epidemiological  and / or clinical management purposes  to differentiate between  SARS-CoV-2 and other Sarbecovirus currently known to infect humans.  If clinically indicated additional testing with an alternate test  methodology [phone]) is advised. The SARS-CoV-2 RNA is generally  detectable in upper and lower respiratory sp ecimens during the acute  phase of infection. The expected result is Negative. Fact Sheet for  Patients:  BoilerBrush.com.cy Fact Sheet for Healthcare Providers: https://pope.com/ This test is not yet approved or cleared by the [place] FDA and has been authorized for detection and/or diagnosis of SARS-CoV-2 by FDA under an Emergency Use Authorization (EUA).  This EUA will remain in effect (meaning this test can be used) for the duration of the COVID-19 declaration under Section 564(b)(1) of the Act, 21 U.S.C. section 360bbb-3(b)(1), unless the authorization is terminated or revoked sooner. Performed at [facility], [address] [place], [place] [address]   Culture, blood (Routine X 2) w Reflex to ID Panel     Status: None (Preliminary result)   Collection Time: 12/30/18  8:33 AM   Specimen: BLOOD RIGHT HAND  Result Value Ref Range Status   Specimen Description   Final    BLOOD RIGHT HAND Performed at [facility], [address] [place], [place] [address]    Special Requests   Final    BOTTLES DRAWN AEROBIC ONLY Blood Culture adequate volume Performed at [facility], [address], [place], [place] [address]    Culture   Final    NO GROWTH 4 DAYS Performed at [facility], [address] [place], [place] [address]    Report Status PENDING  Incomplete    RN Pressure Injury Documentation:     Estimated body mass index is 16.24 kg/m as calculated from the following:   Height as of this encounter: 6' (1.829 m).   Weight as of this encounter: 54.3 kg.  Malnutrition Type:  Nutrition Problem: Increased nutrient needs Etiology: acute illness(sepsis)   Malnutrition Characteristics:  Signs/Symptoms: estimated needs   Nutrition Interventions:  Interventions: MVI, Prostat, Ensure Enlive (each supplement provides 350kcal and 20 grams of protein)   Radiology Studies: No results found.   Scheduled Meds: . chlordiazePOXIDE  10 mg Oral TID  .  Chlorhexidine Gluconate Cloth  6 each Topical Daily  . enoxaparin (LOVENOX) injection  40 mg Subcutaneous Q24H  . feeding supplement (ENSURE ENLIVE)  237 mL Oral BID BM  . feeding supplement (PRO-STAT SUGAR FREE 64)  30 mL Oral BID  . folic acid  1 mg Oral Daily  . iron polysaccharides  150 mg Oral Daily  . mouth rinse  15 mL Mouth Rinse BID  . multivitamin with minerals  1 tablet Oral Daily  . nicotine  21 mg Transdermal Daily  . sodium chloride flush  10-40 mL Intracatheter Q12H  . thiamine  100 mg Oral Daily   Or  . thiamine  100 mg Intravenous Daily   Continuous Infusions: . sodium chloride 50 mL/hr at 01/01/19 1405  . vancomycin 1,750 mg (01/03/19 0757)    LOS: 6 days   Merlene Laughter, DO [facility] PAGER is on AMION  If 7PM-7AM, please contact night-coverage [email or web address] Password [email or web address] 01/03/2019, 1:23 PM

## 2019-01-03 NOTE — Progress Notes (Signed)
Patients MEWS turned yellow due to pulse - ST at this time, not new - and patient is up [place].  No need to intervene at this time

## 2019-01-04 LAB — RAPID URINE DRUG SCREEN, HOSP PERFORMED
Amphetamines: NOT DETECTED
Barbiturates: NOT DETECTED
Benzodiazepines: POSITIVE — AB
Cocaine: NOT DETECTED
Opiates: POSITIVE — AB
Tetrahydrocannabinol: NOT DETECTED

## 2019-01-04 LAB — BASIC METABOLIC PANEL
Anion gap: 8 (ref 5–15)
BUN: 14 mg/dL (ref 6–20)
CO2: 28 mmol/L (ref 22–32)
Calcium: 8.5 mg/dL — ABNORMAL LOW (ref 8.9–10.3)
Chloride: 102 mmol/L (ref 98–111)
Creatinine, Ser: 0.61 mg/dL (ref 0.61–1.24)
GFR calc Af Amer: >60 mL/min (ref >60)
GFR calc non Af Amer: >60 mL/min (ref >60)
Glucose, Bld: 112 mg/dL — ABNORMAL HIGH (ref 70–99)
Potassium: 3.8 mmol/L (ref 3.5–5.1)
Sodium: 138 mmol/L (ref 135–145)

## 2019-01-04 LAB — CBC
HCT: 31.9 % — ABNORMAL LOW (ref 39.0–52.0)
Hemoglobin: 10.2 g/dL — ABNORMAL LOW (ref 13.0–17.0)
MCH: 29.4 pg (ref 26.0–34.0)
MCHC: 32 g/dL (ref 30.0–36.0)
MCV: 91.9 fL (ref 80.0–100.0)
Platelets: 446 10*3/uL — ABNORMAL HIGH (ref 150–400)
RBC: 3.47 MIL/uL — ABNORMAL LOW (ref 4.22–5.81)
RDW: 13.2 % (ref 11.5–15.5)
WBC: 8.3 10*3/uL (ref 4.0–10.5)
nRBC: 0 % (ref 0.0–0.2)

## 2019-01-04 LAB — MAGNESIUM: Magnesium: 2 mg/dL (ref 1.7–2.4)

## 2019-01-04 LAB — PHOSPHORUS: Phosphorus: 3.4 mg/dL (ref 2.5–4.6)

## 2019-01-04 LAB — VANCOMYCIN, PEAK: Vancomycin Pk: 38 ug/mL (ref 30–40)

## 2019-01-04 LAB — CULTURE, BLOOD (ROUTINE X 2)
Culture: NO GROWTH
Special Requests: ADEQUATE

## 2019-01-04 LAB — VANCOMYCIN, TROUGH: Vancomycin Tr: 18 ug/mL (ref 15–20)

## 2019-01-04 LAB — GLUCOSE, CAPILLARY: Glucose-Capillary: 117 mg/dL — ABNORMAL HIGH (ref 70–99)

## 2019-01-04 MED ORDER — METOPROLOL TARTRATE 25 MG PO TABS
12.5000 mg | ORAL_TABLET | Freq: Once | ORAL | Status: AC
  Administered 2019-01-04: 12.5 mg via ORAL
  Filled 2019-01-04: qty 1

## 2019-01-04 MED ORDER — VANCOMYCIN HCL 10 G IV SOLR
1250.0000 mg | Freq: Two times a day (BID) | INTRAVENOUS | Status: DC
  Administered 2019-01-04 – 2019-01-05 (×3): 1250 mg via INTRAVENOUS
  Filled 2019-01-04 (×5): qty 1250

## 2019-01-04 NOTE — Progress Notes (Signed)
PROGRESS NOTE    Alex [name]  ZOX:[id number] DOB: 1985-11-17 DOA: 12/28/2018 PCP: Patient, No Pcp Per   Brief Narrative:  Patient is a 33 year old thin Caucasian male with a past medical history significant for but not limited to alcohol abuse, polysubstance abuse, asthma and other comorbidities who presented to the hospital with chief complaint of generalized body aches and chills.  Uses IV heroin and methamphetamines and last meth use was about a week ago and heroin use was prior to ED arrival.  In the ED he is noted to be febrile on septic without unknown etiology and UDS was positive for amphetamines and opiates.  Blood cultures were positive for MRSA and started on vancomycin.  Further work-up reveals that he has a tricuspid endocarditis with severe tricuspid insufficiency.  Blood cultures have been repeated on 10/20/202 and show NGTD at 3 Days.  We will have cardiothoracic surgery evaluate for further evaluation and recommendations and Dr. Laneta Simmers does not feel there is indication for surgery at this time.  Patient will need long-term antibiotics and ID is recommending at least 14 days of IV vancomycin and considering changing to p.o. patient still complaining of some back pain and is open to the idea of maybe trying Suboxone eventually.  Yesterday the patient was resting and wanting to sleep but today is much more alert and awake and states that his pain is improving and that he feels good.  Nursing reported that he has a friend that comes every evening and bring some stuff and patient locked himself in the bathroom however no contraband has been found.  We will check a UDS just in case..  Assessment & Plan:   Principal Problem:   Sepsis (HCC) Active Problems:   Tobacco use disorder   Polysubstance dependence (HCC)   Substance induced mood disorder (HCC)   IVDU (intravenous drug user)   Endocarditis of tricuspid valve  Sepsis without shock, likely from IV Drug Use MRSA bacteremia  from Tricuspid Endocarditis  AMS in the setting of withdrawal and Bacteremia, Improved  -Mentation is greatly improved but was severely agitated and per nursing keeps getting out of the bed and yelling at the Nursing staff but was calm with me yesterday and was calm today  -Ordered Echocardiogram to rule out infective endocarditis and was normal but TEE showed Tricuspid Valve Endocarditis and Severe Tricuspid Insuffiencey  -CRP was 19.6 and LA was 1.3 -Continue IV Vancomycin-Day 7; Monitor Renal Function closely and Vancomycin peak was 15 and trough was 8 with a creatinine of 0.61 today -Repeat surveillance cultures show NGTD at 33 Days  -Leukocytosis is now 8.3 and was mildly elevated a few days ago -Discontinued Foley -Resumed Regular Diet and patient was on IV fluid hydration with normal saline rate of 100 mL/hr and it was reduced to 50 mL/hr and nw stopped  -ID following and Appreciate further evaluation and recommendations recommending cardiothoracic surgery evaluation I have reached out to the [facility] for formal evaluation given his tricuspid endocarditis along with severe tricuspid valve insufficiency and Dr. Laneta Simmers does not feel there is any indication for surgery at this point -Infectious diseases recommending continue IV antibiotics with vancomycin and changing to p.o. antibiotics after 14 days possibly if he is compliant -Continue to Monitor for S/Sx of Infection   Hyponatremia, improved -Resolved with IVF Hydration and is now 138 -IVF hydration has now stopped  -Continue to Monitor and Trend -Repeat CMP in AM   Transaminitis/Abnormal LFTs -AST was 57 and ALT was  57 and now improved as AST is 22 and ALT is 35 -Checked RUQ U/S and was read as a normal study -Continue to Monitor and Trend -Will not Repeat CMP now that LFTs are normalized   Polysubstance Abuse with concerns of Active Withdrawal, improving  Tobacco Use -C/w Withdrawal Protocol with IV Lorazepam 1-4 mg q1hprn and C/w  IV Lorazepam 2 mg q4hprn Agitation -Continue Chlordiazepoxide 10 mg po TID D to help with his withdrawal and will need to start tapering the next few days  -May consider starting Suboxone but will need a washout. 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 intakes  Normocytic Anemia -Patient's Hb/HCt is now 10.2/31.9 -Checked Anemia Panel and showed iron level of 16, U IBC of 168, TIBC of 184, saturation ratios of 9%, ferritin level 208, folate level of 8.9, vitamin B12 level 436 -Started the patient on iron polysaccharides 150 mg p.o. daily -Continue to Monitor for S/Sx of Bleeding -Repeat CBC in AM    Tricuspid Insuffiencey  -Seen on TEE and was noted to be Severe -Dr. Royann Shivers recommended treating Endocarditis and re-evaluating with Transthoracic ECHO after completion of IV Abx -We will have Cardiothoracic surgery evaluate and currently there is no indication for surgery at this point -C/w IV Abx and Treatment Course for Endocarditis   Hyperglycemia in the  setting of Pre-Diabetes -Initially thought to be Reactive in the setting of infection -Checked HbA1c and was 6.1 -Blood Sugars have been ranging from 89-146 on Daily BMP/CMP; Today was 112 -Continue to Monitor Blood Sugars carefully and if necessary will place on Sensitive Novolog SSI AC  Hypokalemia, improved -Patient's K+ this AM was 3.8 -Mag Level was 2.0 -Continue to Monitor and Replete as Necessary -Repeat CMP in AM   DVT prophylaxis: Enoxaparin 40 mg sq q24h Code Status: FULL CODE  Family Communication: No Family present at bedside  Disposition Plan: Pending further improvement and Clearance by ID  Consultants:   Cardiology for TEE  Infectious Diseases   PCCM/Pulmonary   Cardiothoraic Surgery notified    Procedures:  ECHOCARDIOGRAM 12/30/2018 IMPRESSIONS    1. Left ventricular ejection fraction, by visual estimation, is 60 to 65%. The left ventricle has normal function. Normal left ventricular size. There is no left ventricular hypertrophy.  2. Global right ventricle has normal systolic function.The right ventricular size is normal. No increase in right ventricular wall thickness.  3. Left atrial size was normal.  4. Right atrial size was normal.  5. The mitral valve is normal in structure. No evidence of mitral valve regurgitation. No evidence of mitral stenosis.  6. Thickened TV leaflet  with possible mobile vegetation. ricuspid valve regurgitation is mild.  7. AV is poorly visualized but concerning in the parasternal long axis view for possible vegetation on the ventricular side of AV. Aortic valve regurgitation was not visualized by color flow Doppler.  8. The pulmonic valve was normal in structure. Pulmonic valve regurgitation is not visualized by color flow Doppler.  9. Normal pulmonary artery systolic pressure. 10. The inferior vena cava is normal in size with greater than 50% respiratory variability, suggesting right atrial pressure of 3 mmHg. 11. Recommend  TEE for further evaluation for possible vegetation on AV and TV.  FINDINGS  Left Ventricle: Left ventricular ejection fraction, by visual estimation, is 60 to 65%. The left ventricle has normal function. There is no left ventricular hypertrophy. Normal left ventricular size.  Right Ventricle: The right ventricular size is normal. No increase in right ventricular wall thickness. Global RV systolic function is has normal systolic function. The tricuspid regurgitant velocity is 2.13 m/s, and with an assumed right atrial pressure  of 3 mmHg, the estimated right ventricular systolic pressure is normal at 21.1 mmHg.  Left Atrium: Left atrial size was normal in size.  Right Atrium: Right atrial size was normal in size  Pericardium: There is no evidence of pericardial effusion.  Mitral Valve: The mitral valve is normal in structure. No evidence of mitral valve stenosis by observation. No evidence of mitral valve regurgitation.  Tricuspid Valve:Tricuspid valve regurgitation is mild by color flow Doppler. Thickened TV leaflet with possible mobile vegetation.    Aortic Valve: The aortic valve was not well visualized. Aortic valve regurgitation was not visualized by color flow Doppler. The aortic valve is structurally normal, with no evidence of sclerosis or stenosis. AV is poorly visualized but concerning in the  parasternal long axis view for possible vegetation on the ventricular side of AV.  Pulmonic Valve: The pulmonic valve was normal in structure. Pulmonic valve regurgitation is not visualized by color flow Doppler.  Aorta: The aortic root, ascending aorta and aortic arch are all structurally normal, with no evidence of dilitation or obstruction.  Venous: The inferior vena cava is normal in size with greater than 50% respiratory variability, suggesting right atrial pressure of 3 mmHg.  IAS/Shunts: No atrial level shunt detected by color flow Doppler. No ventricular septal defect is  seen or detected. There is no evidence of an atrial septal defect.     LEFT VENTRICLE PLAX 2D LVIDd:         4.20 cm  Diastology LVIDs:         2.90 cm  LV e' lateral:   12.40 cm/s LV PW:         1.20 cm  LV E/e' lateral: 4.2 LV IVS:        1.10 cm  LV e' medial:    16.20 cm/s LVOT diam:     2.20 cm  LV E/e' medial:  3.2 LV SV:         46 ml LV SV Index:   28.28 LVOT Area:     3.80 cm    RIGHT VENTRICLE RV S prime:     14.10 cm/s TAPSE (M-mode): 2.6 cm  LEFT ATRIUM             Index LA diam:        2.80 cm 1.63 cm/m LA Vol (A2C):   23.8 ml 13.89 ml/m LA Vol (A4C):   34.9 ml 20.37 ml/m LA Biplane Vol: 31.1 ml 18.16 ml/m  AORTIC VALVE LVOT Vmax:   84.90 cm/s LVOT Vmean:  52.200 cm/s LVOT VTI:    0.126 m   AORTA Ao Asc diam: 2.90 cm  MITRAL VALVE                        TRICUSPID VALVE MV Area (PHT): 4.63 cm             TR Peak grad:   18.1 mmHg MV PHT:        47.56 msec           TR Vmax:        216.00 cm/s MV Decel Time: 164 msec MV E velocity: 52.30 cm/s 103 cm/s  SHUNTS MV A velocity: 42.00 cm/s 70.3 cm/s Systemic VTI:  0.13 m MV E/A ratio:  1.25       1.5       Systemic Diam: 2.20 cm  TEE 12/31/2018 IMPRESSIONS    1. Left ventricular ejection fraction, by visual estimation, is 60 to 65%. The left ventricle has normal function. Normal left ventricular size. There is no left ventricular hypertrophy.  2. Global right ventricle has normal systolic function.The right ventricular size is normal. No increase in right ventricular wall thickness.  3. Left atrial size was normal.  4. Right atrial size was normal.  5. The mitral valve is normal in structure. Trace mitral valve regurgitation. No evidence of mitral stenosis.  6. Small vegetation on the tricuspid valve. There is a probable perforation of the anterior leaflet, adjacent to the vegetation.  7. The tricuspid valve is normal in structure. Tricuspid valve regurgitation severe.  8. The aortic valve is  normal in structure. Aortic valve regurgitation was not visualized by color flow Doppler. Structurally normal aortic valve, with no evidence of sclerosis or stenosis.  9. The pulmonic valve was normal in structure. Pulmonic valve regurgitation is not visualized by color flow Doppler. 10. Mildly elevated pulmonary artery systolic pressure.  FINDINGS  Left Ventricle: Left ventricular ejection fraction, by visual estimation, is 60 to 65%. The left ventricle has normal function. No evidence of left ventricular regional wall motion abnormalities. There is no left ventricular hypertrophy. Normal left  ventricular size.  Right Ventricle: The right ventricular size is normal. No increase in right ventricular wall thickness. Global RV systolic function is has normal systolic function. The tricuspid regurgitant velocity is 2.65 m/s, and with an assumed right atrial pressure  of 8 mmHg, the estimated right ventricular systolic pressure is mildly elevated at 36.1 mmHg.  Left Atrium: Left atrial size was normal in size.  Right Atrium: Right atrial size was normal in size  Pericardium: There is no evidence of pericardial effusion.  Mitral Valve: The mitral valve is normal in structure. No evidence of mitral valve stenosis by observation. Trace mitral valve regurgitation. There is no evidence of mitral valve vegetation.  Tricuspid Valve: The tricuspid valve is normal in structure. Tricuspid valve regurgitation severe by color flow Doppler. There is a small non-mobile tricuspid valve vegetation on the anterior leaflet. The TV vegetation measures 5 mm x 7 mm. There appears  to be a 3 mm diameter perforation in the anterior leaflet.  Aortic Valve: The aortic valve is normal in structure. Aortic valve regurgitation was not visualized by color flow Doppler. The aortic valve is structurally normal, with no evidence of sclerosis or stenosis.  Pulmonic Valve: The pulmonic valve was normal in structure.  Pulmonic valve regurgitation is not visualized by color  flow Doppler.  Aorta: The aortic root, ascending aorta and aortic arch are all structurally normal, with no evidence of dilitation or obstruction.  Shunts: No ventricular septal defect is seen or detected. There is no evidence of an atrial septal defect. No atrial level shunt detected by color flow Doppler.    TRICUSPID VALVE             Normals TR Peak grad:   28.1 mmHg TR Vmax:        265.00 cm/s 288 cm/s  Antimicrobials:  Anti-infectives (From admission, onward)   Start     Dose/Rate Route Frequency Ordered Stop   01/04/19 1000  vancomycin (VANCOCIN) 1,250 mg in sodium chloride 0.9 % 250 mL IVPB     1,250 mg 166.7 mL/hr over 90 Minutes Intravenous Every 12 hours 01/04/19 0859     01/02/19 2000  vancomycin (VANCOCIN) 1,750 mg in sodium chloride 0.9 % 500 mL IVPB  Status:  Discontinued     1,750 mg 250 mL/hr over 120 Minutes Intravenous Every 12 hours 01/02/19 1227 01/04/19 0859   12/31/18 1000  vancomycin (VANCOCIN) 1,250 mg in sodium chloride 0.9 % 250 mL IVPB  Status:  Discontinued     1,250 mg 166.7 mL/hr over 90 Minutes Intravenous Every 12 hours 12/31/18 0929 01/02/19 1227   12/28/18 1800  vancomycin (VANCOCIN) IVPB 1000 mg/200 mL premix  Status:  Discontinued     1,000 mg 200 mL/hr over 60 Minutes Intravenous 2 times daily 12/28/18 0914 12/31/18 0929   12/28/18 1400  ceFEPIme (MAXIPIME) 2 g in sodium chloride 0.9 % 100 mL IVPB  Status:  Discontinued     2 g 200 mL/hr over 30 Minutes Intravenous Every 8 hours 12/28/18 0914 12/29/18 1542   12/28/18 0730  vancomycin (VANCOCIN) 1,250 mg in sodium chloride 0.9 % 250 mL IVPB     1,250 mg 166.7 mL/hr over 90 Minutes Intravenous STAT 12/28/18 0715 12/28/18 0935   12/28/18 0730  ceFEPIme (MAXIPIME) 2 g in sodium chloride 0.9 % 100 mL IVPB     2 g 200 mL/hr over 30 Minutes Intravenous STAT 12/28/18 0715 12/28/18 0833     Subjective: Seen and examined at bedside and was  sitting up and eating and states that he is feeling better today and states that his back pain is not as bad.  No chest pain, lightheadedness or dizziness.  No nausea or vomiting.  Nursing reported that his girlfriend is bringing the patient things in the night and patient locked himself in the bathroom for a little while.  Unclear if this is contraband we will check a UDS.  Advised nurse to call night shift to closely monitor the patient.  No other concerns or complaints at this time.  Objective: Vitals:   01/03/19 1418 01/03/19 1659 01/04/19 0005 01/04/19 0550  BP: 118/80 116/80 117/83 125/73  Pulse: (!) 101 89 98 92  Resp: 14 16 18 18   Temp: 98.2 F (36.8 C) 98.2 F (36.8 C) 98 F (36.7 C) 98.3 F (36.8 C)  TempSrc: Oral Oral Oral Oral  SpO2: 99% 100% 98% 99%  Weight:      Height:        Intake/Output Summary (Last 24 hours) at 01/04/2019 0919 Last data filed at 01/04/2019 0800 Gross per 24 hour  Intake 800 ml  Output 1750 ml  Net -950 ml   Filed Weights   12/28/18 0040 12/28/18 1254  Weight: 56.7 kg 54.3 kg   Examination: Physical Exam:  Constitutional: Thin Caucasian male in, NAD and appears calm and comfortable Eyes: Lids and conjunctivae normal, sclerae anicteric  ENMT: External Ears, Nose appear normal. Grossly normal hearing. Mucous membranes are moist.  Neck: Appears normal, supple, no cervical masses, normal ROM, no appreciable thyromegaly; no JVD Respiratory: Diminished to auscultation bilaterally, no wheezing, rales, rhonchi or crackles. Normal respiratory effort and patient is not tachypenic. No accessory muscle use.  Cardiovascular: RRR, no murmurs / rubs / gallops. S1 and S2 auscultated. No extremity edema.  Abdomen: Soft, non-tender, non-distended. Bowel sounds positive x4.  GU: Deferred. Musculoskeletal: No clubbing / cyanosis of digits/nails. No joint deformity upper and lower extremities.  Skin: No rashes, lesions, ulcers on a limited skin evaluation but  has tract marks on his arms and a tattoo on Right Forearm. No induration; Warm and dry.  Neurologic: CN 2-12 grossly intact with no focal deficits. Romberg sign and cerebellar reflexes not assessed.  Psychiatric: Normal judgment and insight. Alert and oriented x 3. Normal mood and appropriate affect.   Data Reviewed: I have personally reviewed following labs and imaging studies  CBC: Recent Labs  Lab 12/31/18 0439 01/01/19 0342 01/02/19 0448 01/03/19 0334 01/04/19 0110  WBC 10.2 10.0 10.6* 10.3 8.3  NEUTROABS  --  6.9 6.1 6.3  --   HGB 10.9* 9.8* 10.6* 10.1* 10.2*  HCT 32.7* 29.8* 32.8* 31.0* 31.9*  MCV 87.9 88.4 90.4 89.6 91.9  PLT 239 265 338 395 446*   Basic Metabolic Panel: Recent Labs  Lab 12/29/18 0920 12/31/18 0439 01/01/19 0342 01/02/19 0448 01/03/19 0334 01/04/19 0110  NA  --  139 138 137 140 138  K  --  3.1* 3.5 3.9 3.9 3.8  CL  --  108 106 105 106 102  CO2  --  23 24 25 26 28   GLUCOSE  --  146* 125* 94 122* 112*  BUN  --  7 5* 9 8 14   CREATININE  --  0.50* 0.51* 0.56* 0.51* 0.61  CALCIUM  --  7.9* 8.2* 8.3* 8.5* 8.5*  MG 1.9 1.9 1.9 1.8 1.9 2.0  PHOS 3.9  --  3.3 3.5 4.4 3.4   GFR: Estimated Creatinine Clearance: 100.9 mL/min (by C-G formula based on SCr of 0.61 mg/dL). Liver Function Tests: Recent Labs  Lab 12/29/18 0609 01/01/19 0342 01/02/19 0448 01/03/19 0334  AST 26 22 26 22   ALT 36 27 30 35  ALKPHOS 166* 151* 155* 150*  BILITOT 0.7 0.4 0.2* 0.2*  PROT 6.0* 5.9* 6.2* 6.4*  ALBUMIN 2.5* 2.2* 2.2* 2.4*   No results for input(s): LIPASE, AMYLASE in the last 168 hours. No results for input(s): AMMONIA in the last 168 hours. Coagulation Profile: No results for input(s): INR, PROTIME in the last 168 hours. Cardiac Enzymes: No results for input(s): CKTOTAL, CKMB, CKMBINDEX, TROPONINI in the last 168 hours. BNP (last 3 results) No results for input(s): PROBNP in the last 8760 hours. HbA1C: No results for input(s): HGBA1C in the last 72  hours. CBG: Recent Labs  Lab 01/01/19 1854 01/02/19 0004 01/02/19 0533 01/02/19 1313 01/04/19 0007  GLUCAP 107* 126* 95 103* 117*   Lipid Profile: No results for input(s): CHOL, HDL, LDLCALC, TRIG, CHOLHDL, LDLDIRECT in the last 72 hours. Thyroid Function Tests: No results for input(s): TSH, T4TOTAL, FREET4, T3FREE, THYROIDAB in the last 72 hours. Anemia Panel: No results for input(s): VITAMINB12, FOLATE, FERRITIN, TIBC, IRON, RETICCTPCT in the last 72 hours. Sepsis Labs: Recent Labs  Lab 12/28/18 1026  LATICACIDVEN 1.3  Recent Results (from the past 240 hour(s))  SARS CORONAVIRUS 2 (TAT 6-24 HRS) Nasopharyngeal Nasopharyngeal Swab     Status: None   Collection Time: 12/28/18  5:37 AM   Specimen: Nasopharyngeal Swab  Result Value Ref Range Status   SARS Coronavirus 2 NEGATIVE NEGATIVE Final    Comment: (NOTE) SARS-CoV-2 target nucleic acids are NOT DETECTED. The SARS-CoV-2 RNA is generally detectable in upper and lower respiratory specimens during the acute phase of infection. Negative results do not preclude SARS-CoV-2 infection, do not rule out co-infections with other pathogens, and should not be used as the sole basis for treatment or other patient management decisions. Negative results must be combined with clinical observations, patient history, and epidemiological information. The expected result is Negative. Fact Sheet for Patients: HairSlick.no Fact Sheet for Healthcare Providers: quierodirigir.com This test is not yet approved or cleared by the [place] FDA and  has been authorized for detection and/or diagnosis of SARS-CoV-2 by FDA under an Emergency Use Authorization (EUA). This EUA will remain  in effect (meaning this test can be used) for the duration of the COVID-19 declaration under Section 56 4(b)(1) of the Act, 21 U.S.C. section 360bbb-3(b)(1), unless the authorization is terminated  or revoked sooner. Performed at [facility], [address], [place], [place] [address]   Blood culture (routine x 2)     Status: Abnormal   Collection Time: 12/28/18  7:47 AM   Specimen: BLOOD RIGHT ARM  Result Value Ref Range Status   Specimen Description   Final    BLOOD RIGHT ARM Performed at [facility], [address] [place], [place] [address]    Special Requests   Final    BOTTLES DRAWN AEROBIC AND ANAEROBIC Blood Culture results may not be optimal due to an excessive volume of blood received in culture bottles Performed at [facility], [address], [place], [place] [address]    Culture  Setup Time   Final    GRAM POSITIVE COCCI IN BOTH AEROBIC AND ANAEROBIC BOTTLES CRITICAL RESULT CALLED TO, READ BACK BY AND VERIFIED WITH: PHARMD JUSTIN L 1948 [id number] [facility] CRITICAL RESULT CALLED TO, READ BACK BY AND VERIFIED WITH: B GREEN,PHARMD AT 2206 12/28/2018 BY L BENFIELD Performed at [facility], [address], [place], [place] [address]    Culture METHICILLIN RESISTANT STAPHYLOCOCCUS AUREUS (A)  Final   Report Status 12/30/2018 FINAL  Final   Organism ID, Bacteria METHICILLIN RESISTANT STAPHYLOCOCCUS AUREUS  Final      Susceptibility   Methicillin resistant staphylococcus aureus - MIC*    CIPROFLOXACIN >=8 RESISTANT Resistant     ERYTHROMYCIN >=8 RESISTANT Resistant     GENTAMICIN <=0.5 SENSITIVE Sensitive     OXACILLIN >=4 RESISTANT Resistant     TETRACYCLINE <=1 SENSITIVE Sensitive     VANCOMYCIN <=0.5 SENSITIVE Sensitive     TRIMETH/SULFA <=10 SENSITIVE Sensitive     CLINDAMYCIN <=0.25 SENSITIVE Sensitive     RIFAMPIN <=0.5 SENSITIVE Sensitive     Inducible Clindamycin NEGATIVE Sensitive     * METHICILLIN RESISTANT STAPHYLOCOCCUS AUREUS  Blood culture (routine x 2)     Status: Abnormal   Collection Time: 12/28/18  7:47 AM   Specimen: BLOOD  Result Value Ref Range Status   Specimen Description   Final    BLOOD RIGHT  WRIST Performed at [facility], [address] [place], [place] [address]    Special Requests   Final  BOTTLES DRAWN AEROBIC AND ANAEROBIC Blood Culture results may not be optimal due to an inadequate volume of blood received in culture bottles Performed at [facility], [address] [place], [place] [address]    Culture  Setup Time   Final    GRAM POSITIVE COCCI IN CLUSTERS IN BOTH AEROBIC AND ANAEROBIC BOTTLES CRITICAL VALUE NOTED.  VALUE IS CONSISTENT WITH PREVIOUSLY REPORTED AND CALLED VALUE.    Culture (A)  Final    STAPHYLOCOCCUS AUREUS SUSCEPTIBILITIES PERFORMED ON PREVIOUS CULTURE WITHIN THE LAST 5 DAYS. Performed at [facility], [address] [place], [place] [address]    Report Status 12/30/2018 FINAL  Final  Urine culture     Status: Abnormal   Collection Time: 12/28/18  7:47 AM   Specimen: In/Out Cath Urine  Result Value Ref Range Status   Specimen Description   Final    IN/OUT CATH URINE Performed at [facility], [address], [place] [place], [place] [address]    Special Requests   Final    NONE Performed at [facility], [address] [place], [place] [address]    Culture (A)  Final    30,000 COLONIES/mL METHICILLIN RESISTANT STAPHYLOCOCCUS AUREUS   Report Status 12/30/2018 FINAL  Final   Organism ID, Bacteria METHICILLIN RESISTANT STAPHYLOCOCCUS AUREUS (A)  Final      Susceptibility   Methicillin resistant staphylococcus aureus - MIC*    CIPROFLOXACIN >=8 RESISTANT Resistant     GENTAMICIN <=0.5 SENSITIVE Sensitive     NITROFURANTOIN <=16 SENSITIVE Sensitive     OXACILLIN >=4 RESISTANT Resistant     TETRACYCLINE <=1 SENSITIVE Sensitive     VANCOMYCIN 1 SENSITIVE Sensitive     TRIMETH/SULFA <=10 SENSITIVE Sensitive     CLINDAMYCIN <=0.25 SENSITIVE Sensitive     RIFAMPIN <=0.5 SENSITIVE Sensitive     Inducible Clindamycin NEGATIVE Sensitive     * 30,000 COLONIES/mL  METHICILLIN RESISTANT STAPHYLOCOCCUS AUREUS  Blood Culture ID Panel (Reflexed)     Status: Abnormal   Collection Time: 12/28/18  7:47 AM  Result Value Ref Range Status   Enterococcus species NOT DETECTED NOT DETECTED Final   Listeria monocytogenes NOT DETECTED NOT DETECTED Final   Staphylococcus species DETECTED (A) NOT DETECTED Final    Comment: CRITICAL RESULT CALLED TO, READ BACK BY AND VERIFIED WITH: B GREEN,PHARMD AT 2206 12/28/2018 BY L BENFIELD    Staphylococcus aureus (BCID) DETECTED (A) NOT DETECTED Final    Comment: Methicillin (oxacillin)-resistant Staphylococcus aureus (MRSA). MRSA is predictably resistant to beta-lactam antibiotics (except ceftaroline). Preferred therapy is vancomycin unless clinically contraindicated. Patient requires contact precautions if  hospitalized. CRITICAL RESULT CALLED TO, READ BACK BY AND VERIFIED WITH: B GREEN,PHARMD AT 2206 12/28/2018 BY L BENFIELD    Methicillin resistance DETECTED (A) NOT DETECTED Final    Comment: CRITICAL RESULT CALLED TO, READ BACK BY AND VERIFIED WITH: B GREEN,PHARMD AT 2206 12/28/2018 BY L BENFIELD    Streptococcus species NOT DETECTED NOT DETECTED Final   Streptococcus agalactiae NOT DETECTED NOT DETECTED Final   Streptococcus pneumoniae NOT DETECTED NOT DETECTED Final   Streptococcus pyogenes NOT DETECTED NOT DETECTED Final   Acinetobacter baumannii NOT DETECTED NOT DETECTED Final   Enterobacteriaceae species NOT DETECTED NOT DETECTED Final   Enterobacter cloacae complex NOT DETECTED NOT DETECTED Final   Escherichia coli NOT DETECTED NOT DETECTED Final   Klebsiella oxytoca NOT DETECTED NOT DETECTED Final   Klebsiella pneumoniae NOT DETECTED NOT DETECTED Final  Proteus species NOT DETECTED NOT DETECTED Final   Serratia marcescens NOT DETECTED NOT DETECTED Final   Haemophilus influenzae NOT DETECTED NOT DETECTED Final   Neisseria meningitidis NOT DETECTED NOT DETECTED Final   Pseudomonas aeruginosa NOT DETECTED NOT  DETECTED Final   Candida albicans NOT DETECTED NOT DETECTED Final   Candida glabrata NOT DETECTED NOT DETECTED Final   Candida krusei NOT DETECTED NOT DETECTED Final   Candida parapsilosis NOT DETECTED NOT DETECTED Final   Candida tropicalis NOT DETECTED NOT DETECTED Final    Comment: Performed at [facility], [place] [address] [place], [place] [address]  MRSA PCR Screening     Status: Abnormal   Collection Time: 12/28/18 12:51 PM   Specimen: Nasal Mucosa; Nasopharyngeal  Result Value Ref Range Status   MRSA by PCR POSITIVE (A) NEGATIVE Final    Comment:        The GeneXpert MRSA Assay (FDA approved for NASAL specimens only), is one component of a comprehensive MRSA colonization surveillance program. It is not intended to diagnose MRSA infection nor to guide or monitor treatment for MRSA infections. RESULT CALLED TO, READ BACK BY AND VERIFIED WITH: K ZULETA,RN 12/28/18 1507 RHOLMES Performed at [facility], [place] [address], [place], [place] [address]   Respiratory Panel by PCR     Status: None   Collection Time: 12/28/18  1:59 PM   Specimen: Nasopharyngeal Swab; Respiratory  Result Value Ref Range Status   Adenovirus NOT DETECTED NOT DETECTED Final   Coronavirus 229E NOT DETECTED NOT DETECTED Final    Comment: (NOTE) The Coronavirus on the Respiratory Panel, DOES NOT test for the novel  Coronavirus (2019 nCoV)    Coronavirus HKU1 NOT DETECTED NOT DETECTED Final   Coronavirus NL63 NOT DETECTED NOT DETECTED Final   Coronavirus OC43 NOT DETECTED NOT DETECTED Final   Metapneumovirus NOT DETECTED NOT DETECTED Final   Rhinovirus / Enterovirus NOT DETECTED NOT DETECTED Final   Influenza A NOT DETECTED NOT DETECTED Final   Influenza B NOT DETECTED NOT DETECTED Final   Parainfluenza Virus 1 NOT DETECTED NOT DETECTED Final   Parainfluenza Virus 2 NOT DETECTED NOT DETECTED Final   Parainfluenza Virus 3 NOT DETECTED NOT DETECTED Final   Parainfluenza Virus 4  NOT DETECTED NOT DETECTED Final   Respiratory Syncytial Virus NOT DETECTED NOT DETECTED Final   Bordetella pertussis NOT DETECTED NOT DETECTED Final   Chlamydophila pneumoniae NOT DETECTED NOT DETECTED Final   Mycoplasma pneumoniae NOT DETECTED NOT DETECTED Final    Comment: Performed at [facility], [place] [place], [place] [address]  SARS Coronavirus 2 by RT PCR (hospital order, performed in [facility] hospital lab) Nasopharyngeal Nasopharyngeal Swab     Status: None   Collection Time: 12/28/18  1:59 PM   Specimen: Nasopharyngeal Swab  Result Value Ref Range Status   SARS Coronavirus 2 NEGATIVE NEGATIVE Final    Comment: (NOTE) If result is NEGATIVE SARS-CoV-2 target nucleic acids are NOT DETECTED. The SARS-CoV-2 RNA is generally detectable in upper and lower  respiratory specimens during the acute phase of infection. The lowest  concentration of SARS-CoV-2 viral copies this assay can detect is 250  copies / mL. A negative result does not preclude SARS-CoV-2 infection  and should not be used as the sole basis for treatment or other  patient management decisions.  A negative result may occur with  improper specimen collection / handling, submission of specimen other  than nasopharyngeal swab, presence  of viral mutation(s) within the  areas targeted by this assay, and inadequate number of viral copies  (<250 copies / mL). A negative result must be combined with clinical  observations, patient history, and epidemiological information. If result is POSITIVE SARS-CoV-2 target nucleic acids are DETECTED. The SARS-CoV-2 RNA is generally detectable in upper and lower  respiratory specimens dur ing the acute phase of infection.  Positive  results are indicative of active infection with SARS-CoV-2.  Clinical  correlation with patient history and other diagnostic information is  necessary to determine patient infection status.  Positive results do  not rule out bacterial  infection or co-infection with other viruses. If result is PRESUMPTIVE POSTIVE SARS-CoV-2 nucleic acids MAY BE PRESENT.   A presumptive positive result was obtained on the submitted specimen  and confirmed on repeat testing.  While 2019 novel coronavirus  (SARS-CoV-2) nucleic acids may be present in the submitted sample  additional confirmatory testing may be necessary for epidemiological  and / or clinical management purposes  to differentiate between  SARS-CoV-2 and other Sarbecovirus currently known to infect humans.  If clinically indicated additional testing with an alternate test  methodology [phone]([id number]) is advised. The SARS-CoV-2 RNA is generally  detectable in upper and lower respiratory sp ecimens during the acute  phase of infection. The expected result is Negative. Fact Sheet for Patients:  BoilerBrush.com.cyhttps://www.fda.gov/media/136312/download Fact Sheet for Healthcare Providers: https://pope.com/https://www.fda.gov/media/136313/download This test is not yet approved or cleared by the [place] FDA and has been authorized for detection and/or diagnosis of SARS-CoV-2 by FDA under an Emergency Use Authorization (EUA).  This EUA will remain in effect (meaning this test can be used) for the duration of the COVID-19 declaration under Section 564(b)(1) of the Act, 21 U.S.C. section 360bbb-3(b)(1), unless the authorization is terminated or revoked sooner. Performed at [facility], [address] [place], [place] [address]   Culture, blood (Routine X 2) w Reflex to ID Panel     Status: None (Preliminary result)   Collection Time: 12/30/18  8:33 AM   Specimen: BLOOD RIGHT HAND  Result Value Ref Range Status   Specimen Description   Final    BLOOD RIGHT HAND Performed at [facility], [address] [place], [place] [address]    Special Requests   Final    BOTTLES DRAWN AEROBIC ONLY Blood Culture adequate volume Performed at [facility], [address] [place], [place] [address]    Culture   Final    NO GROWTH 4 DAYS Performed at [facility], [address] [place], [place] [address]    Report Status PENDING  Incomplete    RN Pressure Injury Documentation:     Estimated body mass index is 16.24 kg/m as calculated from the following:   Height as of this encounter: 6' (1.829 m).   Weight as of this encounter: 54.3 kg.  Malnutrition Type:  Nutrition Problem: Increased nutrient needs Etiology: acute illness(sepsis)   Malnutrition Characteristics:  Signs/Symptoms: estimated needs   Nutrition Interventions:  Interventions: MVI, Prostat, Ensure Enlive (each supplement provides 350kcal and 20 grams of protein)   Radiology Studies: No results found.   Scheduled Meds:  chlordiazePOXIDE  10 mg Oral TID   Chlorhexidine Gluconate Cloth  6 each Topical Daily   enoxaparin (LOVENOX) injection  40 mg Subcutaneous Q24H   feeding supplement (ENSURE ENLIVE)  237 mL Oral BID BM   feeding supplement (PRO-STAT SUGAR FREE 64)  30 mL Oral  BID   folic acid  1 mg Oral Daily   iron polysaccharides  150 mg Oral Daily   mouth rinse  15 mL Mouth Rinse BID   multivitamin with minerals  1 tablet Oral Daily   nicotine  21 mg Transdermal Daily   sodium chloride flush  10-40 mL Intracatheter Q12H   thiamine  100 mg Oral Daily   Or   thiamine  100 mg Intravenous Daily   Continuous Infusions:  vancomycin      LOS: 7 days   Merlene Laughter, DO [facility] PAGER is on AMION  If 7PM-7AM, please contact night-coverage [email or web address] Password [facility] 01/04/2019, 9:19 AM

## 2019-01-04 NOTE — Progress Notes (Signed)
Pharmacy Antibiotic Note  Alex [name] is a 33 y.o. male w/ hx of substance use disorder, admitted on 12/28/2018 with sepsis. BCID on 10/18 positive for MRSA. Pt currently on vancomycin for MRSA bacteremia. Hx of SUD, TTE negative for endocarditis on 10/20. 10/21 TEE showed a small vegetation on the tricuspid valve and severe tricuspid insufficiency.  Pharmacy has been consulted for vancomycin dosing.  Today, 01/04/2019 - Day 8 vancomycin for MRSA bacteremia/endocarditis - TCTS - no indication for surgery - ID recs: 14 days IV abx then consider PO abx  - SCr 0.61 stable - Remains afebrile and WBC WNL 8.3 - repeat BCx ngtd - intended to order levels around 8 pm dose tonight but ordered for 8 pm last night in error so not quite steady-state - Vanc trough 18 mcg/mL, vanc peak 38 mcg/mL  est AUC 664 mcg/m, Ke 0.1107, T1/2 = 6.3  Hrs. Cmax 44.9 Cmin 14.8 Vd 47.6 L.   Plan: Vancomycin 1250 mg IV q12h (est AUC ) SCr q48hrs Vancomycin levels as needed  Height: 6' (182.9 cm) Weight: 119 lb 11.4 oz (54.3 kg) IBW/kg (Calculated) : 77.6  Temp (24hrs), Avg:98.1 F (36.7 C), Min:97.9 F (36.6 C), Max:98.3 F (36.8 C)  Recent Labs  Lab 12/28/18 1026  12/31/18 0439  01/01/19 0342 01/02/19 0448 01/02/19 0950 01/03/19 0334 01/04/19 0110 01/04/19 0755  WBC  --    < > 10.2  --  10.0 10.6*  --  10.3 8.3  --   CREATININE  --    < > 0.50*  --  0.51* 0.56*  --  0.51* 0.61  --   LATICACIDVEN 1.3  --   --   --   --   --   --   --   --   --   VANCOTROUGH  --   --   --    < >  --   --  8*  --   --  18  VANCOPEAK  --    < >  --   --   --  15*  --   --  38  --    < > = values in this interval not displayed.    Estimated Creatinine Clearance: 100.9 mL/min (by C-G formula based on SCr of 0.61 mg/dL).    No Known Allergies Antimicrobials this admission: Vanc 10/18 >>  Cefepime 10/18 >> 10/19 Dose adjustments this admission: 10/21: levels Vanc 1000mg  q12h >> vanco 1250mg  q12h 10/23 levels Vanc  1250mg  q12h >> vanco 1750mg  q12h 10/25 levels Vanc 1750 q12>>vanco 1250 q12  Microbiology results: 10/18 BCx x2: MRSA vanc MIC 0.5 F 10/18 UCx: MRSA vanc MIC 1 F 10/18 MRSA PCR: positive 10/18: Resp Panel: negative 10/18: BCID 4 of 4 MRSA - on Vanc 10/18: COVID: negative 10/20: BCx x2 repeat: ngtd 10/22 GC/chlamydia>> collected 10/22 RPR: neg  Thank you for allowing pharmacy to be a part of this patient's care.  Eudelia Bunch, Pharm.D [phone] 01/04/2019 9:11 AM

## 2019-01-05 DIAGNOSIS — Z95828 Presence of other vascular implants and grafts: Secondary | ICD-10-CM

## 2019-01-05 LAB — COMPREHENSIVE METABOLIC PANEL
ALT: 59 U/L — ABNORMAL HIGH (ref 0–44)
AST: 36 U/L (ref 15–41)
Albumin: 2.9 g/dL — ABNORMAL LOW (ref 3.5–5.0)
Alkaline Phosphatase: 180 U/L — ABNORMAL HIGH (ref 38–126)
Anion gap: 10 (ref 5–15)
BUN: 13 mg/dL (ref 6–20)
CO2: 28 mmol/L (ref 22–32)
Calcium: 9.4 mg/dL (ref 8.9–10.3)
Chloride: 101 mmol/L (ref 98–111)
Creatinine, Ser: 0.64 mg/dL (ref 0.61–1.24)
GFR calc Af Amer: >60 mL/min (ref >60)
GFR calc non Af Amer: >60 mL/min (ref >60)
Glucose, Bld: 105 mg/dL — ABNORMAL HIGH (ref 70–99)
Potassium: 3.8 mmol/L (ref 3.5–5.1)
Sodium: 139 mmol/L (ref 135–145)
Total Bilirubin: 0.4 mg/dL (ref 0.3–1.2)
Total Protein: 7.8 g/dL (ref 6.5–8.1)

## 2019-01-05 LAB — CBC WITH DIFFERENTIAL/PLATELET
Abs Immature Granulocytes: 0.05 10*3/uL (ref 0.00–0.07)
Basophils Absolute: 0.1 10*3/uL (ref 0.0–0.1)
Basophils Relative: 1 %
Eosinophils Absolute: 0.2 10*3/uL (ref 0.0–0.5)
Eosinophils Relative: 2 %
HCT: 35.9 % — ABNORMAL LOW (ref 39.0–52.0)
Hemoglobin: 11.5 g/dL — ABNORMAL LOW (ref 13.0–17.0)
Immature Granulocytes: 0 %
Lymphocytes Relative: 21 %
Lymphs Abs: 2.4 10*3/uL (ref 0.7–4.0)
MCH: 29.3 pg (ref 26.0–34.0)
MCHC: 32 g/dL (ref 30.0–36.0)
MCV: 91.3 fL (ref 80.0–100.0)
Monocytes Absolute: 0.7 10*3/uL (ref 0.1–1.0)
Monocytes Relative: 6 %
Neutro Abs: 8 10*3/uL — ABNORMAL HIGH (ref 1.7–7.7)
Neutrophils Relative %: 70 %
Platelets: 548 10*3/uL — ABNORMAL HIGH (ref 150–400)
RBC: 3.93 MIL/uL — ABNORMAL LOW (ref 4.22–5.81)
RDW: 13.1 % (ref 11.5–15.5)
WBC: 11.5 10*3/uL — ABNORMAL HIGH (ref 4.0–10.5)
nRBC: 0 % (ref 0.0–0.2)

## 2019-01-05 LAB — RAPID URINE DRUG SCREEN, HOSP PERFORMED
Amphetamines: POSITIVE — AB
Barbiturates: NOT DETECTED
Benzodiazepines: POSITIVE — AB
Cocaine: NOT DETECTED
Opiates: POSITIVE — AB
Tetrahydrocannabinol: NOT DETECTED

## 2019-01-05 LAB — MAGNESIUM: Magnesium: 2.1 mg/dL (ref 1.7–2.4)

## 2019-01-05 LAB — PHOSPHORUS: Phosphorus: 3.9 mg/dL (ref 2.5–4.6)

## 2019-01-05 MED ORDER — SULFAMETHOXAZOLE-TRIMETHOPRIM 800-160 MG PO TABS
1.0000 | ORAL_TABLET | Freq: Two times a day (BID) | ORAL | Status: DC
  Administered 2019-01-06: 1 via ORAL
  Filled 2019-01-05: qty 1

## 2019-01-05 MED ORDER — ORITAVANCIN DIPHOSPHATE 400 MG IV SOLR
1200.0000 mg | Freq: Once | INTRAVENOUS | Status: AC
  Administered 2019-01-05: 1200 mg via INTRAVENOUS
  Filled 2019-01-05: qty 120

## 2019-01-05 NOTE — Progress Notes (Addendum)
INFECTIOUS DISEASE PROGRESS NOTE  ID: Alex [name] is a 33 y.o. male with  Principal Problem:   Sepsis (HCC) Active Problems:   Tobacco use disorder   Polysubstance dependence (HCC)   Substance induced mood disorder (HCC)   IVDU (intravenous drug user)   Endocarditis of tricuspid valve  Subjective: Still some back pain.   Abtx:  Anti-infectives (From admission, onward)   Start     Dose/Rate Route Frequency Ordered Stop   01/04/19 1000  vancomycin (VANCOCIN) 1,250 mg in sodium chloride 0.9 % 250 mL IVPB     1,250 mg 166.7 mL/hr over 90 Minutes Intravenous Every 12 hours 01/04/19 0859     01/02/19 2000  vancomycin (VANCOCIN) 1,750 mg in sodium chloride 0.9 % 500 mL IVPB  Status:  Discontinued     1,750 mg 250 mL/hr over 120 Minutes Intravenous Every 12 hours 01/02/19 1227 01/04/19 0859   12/31/18 1000  vancomycin (VANCOCIN) 1,250 mg in sodium chloride 0.9 % 250 mL IVPB  Status:  Discontinued     1,250 mg 166.7 mL/hr over 90 Minutes Intravenous Every 12 hours 12/31/18 0929 01/02/19 1227   12/28/18 1800  vancomycin (VANCOCIN) IVPB 1000 mg/200 mL premix  Status:  Discontinued     1,000 mg 200 mL/hr over 60 Minutes Intravenous 2 times daily 12/28/18 0914 12/31/18 0929   12/28/18 1400  ceFEPIme (MAXIPIME) 2 g in sodium chloride 0.9 % 100 mL IVPB  Status:  Discontinued     2 g 200 mL/hr over 30 Minutes Intravenous Every 8 hours 12/28/18 0914 12/29/18 1542   12/28/18 0730  vancomycin (VANCOCIN) 1,250 mg in sodium chloride 0.9 % 250 mL IVPB     1,250 mg 166.7 mL/hr over 90 Minutes Intravenous STAT 12/28/18 0715 12/28/18 0935   12/28/18 0730  ceFEPIme (MAXIPIME) 2 g in sodium chloride 0.9 % 100 mL IVPB     2 g 200 mL/hr over 30 Minutes Intravenous STAT 12/28/18 0715 12/28/18 0833      Medications:  Scheduled: . chlordiazePOXIDE  10 mg Oral TID  . Chlorhexidine Gluconate Cloth  6 each Topical Daily  . enoxaparin (LOVENOX) injection  40 mg Subcutaneous Q24H  . feeding  supplement (ENSURE ENLIVE)  237 mL Oral BID BM  . feeding supplement (PRO-STAT SUGAR FREE 64)  30 mL Oral BID  . folic acid  1 mg Oral Daily  . iron polysaccharides  150 mg Oral Daily  . mouth rinse  15 mL Mouth Rinse BID  . multivitamin with minerals  1 tablet Oral Daily  . nicotine  21 mg Transdermal Daily  . sodium chloride flush  10-40 mL Intracatheter Q12H  . thiamine  100 mg Oral Daily   Or  . thiamine  100 mg Intravenous Daily    Objective: Vital signs in last 24 hours: Temp:  [97.7 F (36.5 C)-98.4 F (36.9 C)] 97.7 F (36.5 C) (10/26 1512) Pulse Rate:  [106-122] 110 (10/26 1512) Resp:  [16-19] 16 (10/26 1512) BP: (118-127)/(74-89) 125/89 (10/26 1512) SpO2:  [98 %-100 %] 100 % (10/26 1512)   General appearance: alert, cooperative and no distress Resp: clear to auscultation bilaterally Cardio: regular rate and rhythm GI: normal findings: bowel sounds normal and soft, non-tender Extremities: LUE IV  Lab Results Recent Labs    01/04/19 0110 01/05/19 0412  WBC 8.3 11.5*  HGB 10.2* 11.5*  HCT 31.9* 35.9*  NA 138 139  K 3.8 3.8  CL 102 101  CO2 28 28  BUN  14 13  CREATININE 0.61 0.64   Liver Panel Recent Labs    01/03/19 0334 01/05/19 0412  PROT 6.4* 7.8  ALBUMIN 2.4* 2.9*  AST 22 36  ALT 35 59*  ALKPHOS 150* 180*  BILITOT 0.2* 0.4   Sedimentation Rate No results for input(s): ESRSEDRATE in the last 72 hours. C-Reactive Protein No results for input(s): CRP in the last 72 hours.  Microbiology: Recent Results (from the past 240 hour(s))  SARS CORONAVIRUS 2 (TAT 6-24 HRS) Nasopharyngeal Nasopharyngeal Swab     Status: None   Collection Time: 12/28/18  5:37 AM   Specimen: Nasopharyngeal Swab  Result Value Ref Range Status   SARS Coronavirus 2 NEGATIVE NEGATIVE Final    Comment: (NOTE) SARS-CoV-2 target nucleic acids are NOT DETECTED. The SARS-CoV-2 RNA is generally detectable in upper and lower respiratory specimens during the acute phase of  infection. Negative results do not preclude SARS-CoV-2 infection, do not rule out co-infections with other pathogens, and should not be used as the sole basis for treatment or other patient management decisions. Negative results must be combined with clinical observations, patient history, and epidemiological information. The expected result is Negative. Fact Sheet for Patients: HairSlick.no Fact Sheet for Healthcare Providers: quierodirigir.com This test is not yet approved or cleared by the [place] FDA and  has been authorized for detection and/or diagnosis of SARS-CoV-2 by FDA under an Emergency Use Authorization (EUA). This EUA will remain  in effect (meaning this test can be used) for the duration of the COVID-19 declaration under Section 56 4(b)(1) of the Act, 21 U.S.C. section 360bbb-3(b)(1), unless the authorization is terminated or revoked sooner. Performed at [facility], [address], [place], [place] [address]   Blood culture (routine x 2)     Status: Abnormal   Collection Time: 12/28/18  7:47 AM   Specimen: BLOOD RIGHT ARM  Result Value Ref Range Status   Specimen Description   Final    BLOOD RIGHT ARM Performed at [facility], [address], [place], [place] [address]    Special Requests   Final    BOTTLES DRAWN AEROBIC AND ANAEROBIC Blood Culture results may not be optimal due to an excessive volume of blood received in culture bottles Performed at [facility] [address], [place], [place] [address]    Culture  Setup Time   Final    GRAM POSITIVE COCCI IN BOTH AEROBIC AND ANAEROBIC BOTTLES CRITICAL RESULT CALLED TO, READ BACK BY AND VERIFIED WITH: PHARMD JUSTIN L 1948 [id number] FCP CRITICAL RESULT CALLED TO, READ BACK BY AND VERIFIED WITH: B GREEN,PHARMD AT 2206 12/28/2018 BY L BENFIELD Performed at [facility], [address] [place], [place] [address]     Culture METHICILLIN RESISTANT STAPHYLOCOCCUS AUREUS (A)  Final   Report Status 12/30/2018 FINAL  Final   Organism ID, Bacteria METHICILLIN RESISTANT STAPHYLOCOCCUS AUREUS  Final      Susceptibility   Methicillin resistant staphylococcus aureus - MIC*    CIPROFLOXACIN >=8 RESISTANT Resistant     ERYTHROMYCIN >=8 RESISTANT Resistant     GENTAMICIN <=0.5 SENSITIVE Sensitive     OXACILLIN >=4 RESISTANT Resistant     TETRACYCLINE <=1 SENSITIVE Sensitive     VANCOMYCIN <=0.5 SENSITIVE Sensitive     TRIMETH/SULFA <=10 SENSITIVE Sensitive     CLINDAMYCIN <=0.25 SENSITIVE Sensitive     RIFAMPIN <=0.5 SENSITIVE Sensitive     Inducible Clindamycin NEGATIVE Sensitive     *  METHICILLIN RESISTANT STAPHYLOCOCCUS AUREUS  Blood culture (routine x 2)     Status: Abnormal   Collection Time: 12/28/18  7:47 AM   Specimen: BLOOD  Result Value Ref Range Status   Specimen Description   Final    BLOOD RIGHT WRIST Performed at [place] [address], [place], [place] [address]    Special Requests   Final    BOTTLES DRAWN AEROBIC AND ANAEROBIC Blood Culture results may not be optimal due to an inadequate volume of blood received in culture bottles Performed at [place] [address], [place], [place] [address]    Culture  Setup Time   Final    GRAM POSITIVE COCCI IN CLUSTERS IN BOTH AEROBIC AND ANAEROBIC BOTTLES CRITICAL VALUE NOTED.  VALUE IS CONSISTENT WITH PREVIOUSLY REPORTED AND CALLED VALUE.    Culture (A)  Final    STAPHYLOCOCCUS AUREUS SUSCEPTIBILITIES PERFORMED ON PREVIOUS CULTURE WITHIN THE LAST 5 DAYS. Performed at [facility], [place] [address] [place], [place] [address]    Report Status 12/30/2018 FINAL  Final  Urine culture     Status: Abnormal   Collection Time: 12/28/18  7:47 AM   Specimen: In/Out Cath Urine  Result Value Ref Range Status   Specimen Description   Final    IN/OUT CATH URINE Performed at [place] [address] [place], [place] [address]    Special Requests   Final    NONE Performed at [facility], [place] [address], [place], [place] [address]    Culture (A)  Final    30,000 COLONIES/mL METHICILLIN RESISTANT STAPHYLOCOCCUS AUREUS   Report Status 12/30/2018 FINAL  Final   Organism ID, Bacteria METHICILLIN RESISTANT STAPHYLOCOCCUS AUREUS (A)  Final      Susceptibility   Methicillin resistant staphylococcus aureus - MIC*    CIPROFLOXACIN >=8 RESISTANT Resistant     GENTAMICIN <=0.5 SENSITIVE Sensitive     NITROFURANTOIN <=16 SENSITIVE Sensitive     OXACILLIN >=4 RESISTANT Resistant     TETRACYCLINE <=1 SENSITIVE Sensitive     VANCOMYCIN 1 SENSITIVE Sensitive     TRIMETH/SULFA <=10 SENSITIVE Sensitive     CLINDAMYCIN <=0.25 SENSITIVE Sensitive     RIFAMPIN <=0.5 SENSITIVE Sensitive     Inducible Clindamycin NEGATIVE Sensitive     * 30,000 COLONIES/mL METHICILLIN RESISTANT STAPHYLOCOCCUS AUREUS  Blood Culture ID Panel (Reflexed)     Status: Abnormal   Collection Time: 12/28/18  7:47 AM  Result Value Ref Range Status   Enterococcus species NOT DETECTED NOT DETECTED Final   Listeria monocytogenes NOT DETECTED NOT DETECTED Final   Staphylococcus species DETECTED (A) NOT DETECTED Final    Comment: CRITICAL RESULT CALLED TO, READ BACK BY AND VERIFIED WITH: B GREEN,PHARMD AT 2206 12/28/2018 BY L BENFIELD    Staphylococcus aureus (BCID) DETECTED (A) NOT DETECTED Final    Comment: Methicillin (oxacillin)-resistant Staphylococcus aureus (MRSA). MRSA is predictably resistant to beta-lactam antibiotics (except ceftaroline). Preferred therapy is vancomycin unless clinically contraindicated. Patient requires contact precautions if  hospitalized. CRITICAL RESULT CALLED TO, READ BACK BY AND VERIFIED WITH: B GREEN,PHARMD AT 2206 12/28/2018 BY L BENFIELD    Methicillin resistance DETECTED (A) NOT DETECTED Final    Comment: CRITICAL RESULT CALLED TO, READ BACK BY  AND VERIFIED WITH: B GREEN,PHARMD AT 2206 12/28/2018 BY L BENFIELD    Streptococcus species NOT DETECTED NOT DETECTED Final   Streptococcus agalactiae NOT DETECTED NOT DETECTED Final   Streptococcus  pneumoniae NOT DETECTED NOT DETECTED Final   Streptococcus pyogenes NOT DETECTED NOT DETECTED Final   Acinetobacter baumannii NOT DETECTED NOT DETECTED Final   Enterobacteriaceae species NOT DETECTED NOT DETECTED Final   Enterobacter cloacae complex NOT DETECTED NOT DETECTED Final   Escherichia coli NOT DETECTED NOT DETECTED Final   Klebsiella oxytoca NOT DETECTED NOT DETECTED Final   Klebsiella pneumoniae NOT DETECTED NOT DETECTED Final   Proteus species NOT DETECTED NOT DETECTED Final   Serratia marcescens NOT DETECTED NOT DETECTED Final   Haemophilus influenzae NOT DETECTED NOT DETECTED Final   Neisseria meningitidis NOT DETECTED NOT DETECTED Final   Pseudomonas aeruginosa NOT DETECTED NOT DETECTED Final   Candida albicans NOT DETECTED NOT DETECTED Final   Candida glabrata NOT DETECTED NOT DETECTED Final   Candida krusei NOT DETECTED NOT DETECTED Final   Candida parapsilosis NOT DETECTED NOT DETECTED Final   Candida tropicalis NOT DETECTED NOT DETECTED Final    Comment: Performed at [facility], [address] [place], [place] [address]  MRSA PCR Screening     Status: Abnormal   Collection Time: 12/28/18 12:51 PM   Specimen: Nasal Mucosa; Nasopharyngeal  Result Value Ref Range Status   MRSA by PCR POSITIVE (A) NEGATIVE Final    Comment:        The GeneXpert MRSA Assay (FDA approved for NASAL specimens only), is one component of a comprehensive MRSA colonization surveillance program. It is not intended to diagnose MRSA infection nor to guide or monitor treatment for MRSA infections. RESULT CALLED TO, READ BACK BY AND VERIFIED WITH: K ZULETA,RN 12/28/18 1507 RHOLMES Performed at [facility], [address] [place], [place] [address]   Respiratory  Panel by PCR     Status: None   Collection Time: 12/28/18  1:59 PM   Specimen: Nasopharyngeal Swab; Respiratory  Result Value Ref Range Status   Adenovirus NOT DETECTED NOT DETECTED Final   Coronavirus 229E NOT DETECTED NOT DETECTED Final    Comment: (NOTE) The Coronavirus on the Respiratory Panel, DOES NOT test for the novel  Coronavirus (2019 nCoV)    Coronavirus HKU1 NOT DETECTED NOT DETECTED Final   Coronavirus NL63 NOT DETECTED NOT DETECTED Final   Coronavirus OC43 NOT DETECTED NOT DETECTED Final   Metapneumovirus NOT DETECTED NOT DETECTED Final   Rhinovirus / Enterovirus NOT DETECTED NOT DETECTED Final   Influenza A NOT DETECTED NOT DETECTED Final   Influenza B NOT DETECTED NOT DETECTED Final   Parainfluenza Virus 1 NOT DETECTED NOT DETECTED Final   Parainfluenza Virus 2 NOT DETECTED NOT DETECTED Final   Parainfluenza Virus 3 NOT DETECTED NOT DETECTED Final   Parainfluenza Virus 4 NOT DETECTED NOT DETECTED Final   Respiratory Syncytial Virus NOT DETECTED NOT DETECTED Final   Bordetella pertussis NOT DETECTED NOT DETECTED Final   Chlamydophila pneumoniae NOT DETECTED NOT DETECTED Final   Mycoplasma pneumoniae NOT DETECTED NOT DETECTED Final    Comment: Performed at [facility], [address] [place], [place] [address]  SARS Coronavirus 2 by RT PCR (hospital order, performed in [facility] hospital lab) Nasopharyngeal Nasopharyngeal Swab     Status: None   Collection Time: 12/28/18  1:59 PM   Specimen: Nasopharyngeal Swab  Result Value Ref Range Status   SARS Coronavirus 2 NEGATIVE NEGATIVE Final    Comment: (NOTE) If result is NEGATIVE SARS-CoV-2 target nucleic acids are NOT DETECTED. The SARS-CoV-2 RNA is generally detectable in upper and lower  respiratory specimens during  the acute phase of infection. The lowest  concentration of SARS-CoV-2 viral copies this assay can detect is 250  copies / mL. A negative result does not preclude SARS-CoV-2 infection  and  should not be used as the sole basis for treatment or other  patient management decisions.  A negative result may occur with  improper specimen collection / handling, submission of specimen other  than nasopharyngeal swab, presence of viral mutation(s) within the  areas targeted by this assay, and inadequate number of viral copies  (<250 copies / mL). A negative result must be combined with clinical  observations, patient history, and epidemiological information. If result is POSITIVE SARS-CoV-2 target nucleic acids are DETECTED. The SARS-CoV-2 RNA is generally detectable in upper and lower  respiratory specimens dur ing the acute phase of infection.  Positive  results are indicative of active infection with SARS-CoV-2.  Clinical  correlation with patient history and other diagnostic information is  necessary to determine patient infection status.  Positive results do  not rule out bacterial infection or co-infection with other viruses. If result is PRESUMPTIVE POSTIVE SARS-CoV-2 nucleic acids MAY BE PRESENT.   A presumptive positive result was obtained on the submitted specimen  and confirmed on repeat testing.  While 2019 novel coronavirus  (SARS-CoV-2) nucleic acids may be present in the submitted sample  additional confirmatory testing may be necessary for epidemiological  and / or clinical management purposes  to differentiate between  SARS-CoV-2 and other Sarbecovirus currently known to infect humans.  If clinically indicated additional testing with an alternate test  methodology [id number] is advised. The SARS-CoV-2 RNA is generally  detectable in upper and lower respiratory sp ecimens during the acute  phase of infection. The expected result is Negative. Fact Sheet for Patients:  BoilerBrush.com.cy Fact Sheet for Healthcare Providers: https://pope.com/ This test is not yet approved or cleared by the [place] FDA and has been  authorized for detection and/or diagnosis of SARS-CoV-2 by FDA under an Emergency Use Authorization (EUA).  This EUA will remain in effect (meaning this test can be used) for the duration of the COVID-19 declaration under Section 564(b)(1) of the Act, 21 U.S.C. section 360bbb-3(b)(1), unless the authorization is terminated or revoked sooner. Performed at [facility], [address], [place], [place] [address]   Culture, blood (Routine X 2) w Reflex to ID Panel     Status: None   Collection Time: 12/30/18  8:33 AM   Specimen: BLOOD RIGHT HAND  Result Value Ref Range Status   Specimen Description   Final    BLOOD RIGHT HAND Performed at [facility], [address], [place], [place] [address]    Special Requests   Final    BOTTLES DRAWN AEROBIC ONLY Blood Culture adequate volume Performed at [facility], [address], [place], [place] [address]    Culture   Final    NO GROWTH 5 DAYS Performed at [facility], [address] [place], [place] [address]    Report Status 01/04/2019 FINAL  Final    Studies/Results: No results found.   Assessment/Plan: Staph aureus bacteremia, MRSA TV IE IVDA ETOH abuse  Total days of antibiotics:8vanco     Spoke with Dr Marland Mcalpine- suspicion that he is using his midline to inject. His UDS from 10-25 is positive for benzos and opiates.  Will change him to po bactrim 1 ds bid for 6 weeks. Start tomorrow.  Will discuss with pharm about giving  him a dose of oritavancin.  Pull midline.  Explained at length with pt and his dad.       Johny Sax MD, FACP Infectious Diseases (pager) [phone] [email or web address] 01/05/2019, 4:58 PM  LOS: 8 days

## 2019-01-05 NOTE — Progress Notes (Signed)
While doing pt assessment found a clear small empty clear bag in pt left sock. Asked patient did he have any medication on him that wasn't given to him by medical staff. He stated no. Educated patient about no outside medication is allowed in the facility. Pt verbalized understanding.Pt continually stated he didn't have any medication and he's not for sure how the bag got there.Tele sitter in place. Will continue to monitor.

## 2019-01-05 NOTE — Progress Notes (Signed)
Patient called to report IV site leaking. Upon entering the room midline site appeared to be intact. IV line port closest to site appeared to be leaking air bubbles at port. Pt reports trying to fix IV himself. Education provided regarding midline care. Pt encouraged to contact nursing support to assist if there are issues with IV access. Unable to access blood return. IV RN contacted to assess.

## 2019-01-05 NOTE — Progress Notes (Signed)
Patient complained of anxiety and feeling agitated. Ativan administered. Bed alarm set.

## 2019-01-05 NOTE — Progress Notes (Signed)
PROGRESS NOTE    Alex [name]  AVW:[id number] DOB: 05-30-1985 DOA: 12/28/2018 PCP: Patient, No Pcp Per   Brief Narrative:  Patient is a 33 year old thin Caucasian male with a past medical history significant for but not limited to alcohol abuse, polysubstance abuse, asthma and other comorbidities who presented to the hospital with chief complaint of generalized body aches and chills.  Uses IV heroin and methamphetamines and last meth use was about a week ago and heroin use was prior to ED arrival.  In the ED he is noted to be febrile on septic without unknown etiology and UDS was positive for amphetamines and opiates.  Blood cultures were positive for MRSA and started on vancomycin.  Further work-up reveals that he has a tricuspid endocarditis with severe tricuspid insufficiency.  Blood cultures have been repeated on 10/20/202 and show NGTD at 3 Days.  We will have cardiothoracic surgery evaluate for further evaluation and recommendations and Dr. Laneta SimmersBartle does not feel there is indication for surgery at this time.  Patient will need long-term antibiotics and ID is recommending at least 14 days of IV vancomycin and considering changing to p.o. patient still complaining of some back pain and is open to the idea of maybe trying Suboxone eventually.  The day before the patient was resting and wanting to sleep but yesterday is much more alert and awake and states that his pain is improving and that he feels good.  Nursing reported that he has a friend that comes every evening and bring some stuff and patient locked himself in the bathroom however no contraband has been found.  We will check a UDS just in case.  The friend turned out to be his sister and patient's father suspect the patient's sister is bringing him medications to inject.  Nurse reported that the midline IV was leaking and that the port closest to the site appeared to be leaking air bubbles.  It is likely the patient is injected into his port and  will repeat another UDS and place the patient with a telemetry sitter given the suspicion that he is using his IV to inject himself with substances.  Patient has extremely poor venous access so IV cannot really be removed.  Assessment & Plan:   Principal Problem:   Sepsis (HCC) Active Problems:   Tobacco use disorder   Polysubstance dependence (HCC)   Substance induced mood disorder (HCC)   IVDU (intravenous drug user)   Endocarditis of tricuspid valve  Sepsis without shock, likely from IV Drug Use MRSA bacteremia from Tricuspid Endocarditis  AMS in the setting of withdrawal and Bacteremia, Improved  -Mentation is greatly improved but was severely agitated and per nursing kept getting out of the bed and yelling at the Nursing staff but was calm today  -Ordered Echocardiogram to rule out infective endocarditis and was normal but TEE showed Tricuspid Valve Endocarditis and Severe Tricuspid Insuffiencey  -CRP was 19.6 and LA was 1.3 -Continue IV Vancomycin-Day 8; Monitor Renal Function closely and Vancomycin peak was 15 and trough was 8 with a creatinine of 0.61 today -Repeat surveillance cultures show NGTD at 5 Days; Will again repeat given suspected IVDU through his Midline  -Leukocytosis has worsened now and went from 8.3 is now 11.5 and likely in the setting of suspected intravenous drug use -Discontinued Foley -IV fluid has now stopped -ID following and Appreciate further evaluation and recommendations recommending cardiothoracic surgery evaluation I have reached out to the [facility] for formal evaluation given his tricuspid  endocarditis along with severe tricuspid valve insufficiency and Dr. Laneta Simmers does not feel there is any indication for surgery at this point -Infectious diseases recommending continue IV antibiotics with vancomycin and changing to p.o. antibiotics after 14 days possibly if he is compliant -Continue to Monitor for S/Sx of Infection  -It is highly likely the patient has used  his midline after direct access to inject substances into his vein.  Patient's sister is no longer allowed to visit and will place a telemetry sitter and repeat blood cultures and a UDS -We will monitor the patient closely and if necessary will need security search as well -Will need to Discuss with ID to change to po Therapy   Hyponatremia, improved -Resolved with IVF Hydration and is now 139 -IVF hydration has now stopped  -Continue to Monitor and Trend -Repeat CMP in AM   Transaminitis/Abnormal LFTs -AST was 57 and ALT was 57 and now improved as AST is 22 and ALT is 35 -Checked RUQ U/S and was read as a normal study -Continue to Monitor and Trend -Will not Repeat CMP now that LFTs are normalized   Polysubstance Abuse with concerns of Active Withdrawal, improving  Tobacco Use -C/w Withdrawal Protocol with IV Lorazepam 1-4 mg q1hprn and C/w IV Lorazepam 2 mg q4hprn Agitation -Continue Chlordiazepoxide 10 mg po TID D to help with his withdrawal and will need to start tapering the next few days  -May consider starting Suboxone but will need a washout. -C/w Nicotine 21 mg TD Patch -C/w Folic Acid 1 mg po Daily, MVI+Minerals, and Thiamine 100 mg po Daily  -C/w Haloperidol Lactate 5 mg IV q6hprn for Agitation -Is open to the idea of possibly starting Suboxone but I will not currently start it at this point -IVF stopped  -Check UDS as Nursing reporting friend bringing stuff for the patient nightly; turned out to be a sister and patient's father believes that the patient may have gotten stuck from his sister to inject into his midline -As above; sister no longer allowed to visit and will place a telemetry sitter given inability to trust patient   History of Asthma -C/w Albuterol 2.5 mg Neg q2hprn Wheezing and SOB  Penile Lesion -STI work-up being done and 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 intakes  Normocytic Anemia -Patient's Hb/HCt is now 11.5/35.9 -Checked Anemia Panel and showed iron level of 16, U IBC of 168, TIBC of 184, saturation ratios of 9%, ferritin level 208, folate level of 8.9, vitamin B12 level 436 -Started the patient on iron polysaccharides 150 mg p.o. daily -Continue to Monitor for S/Sx of Bleeding -Repeat CBC in AM    Tricuspid Insuffiencey  -Seen on TEE and was noted to be Severe -Dr. Royann Shivers recommended treating Endocarditis and re-evaluating with Transthoracic ECHO after completion of IV Abx -We will have Cardiothoracic surgery evaluate and currently there is no indication for surgery at this point -C/w IV Abx and Treatment Course for Endocarditis   Hyperglycemia in the setting of Pre-Diabetes -Initially thought to be Reactive in the setting of infection -Checked HbA1c and was 6.1 -Blood Sugars  have been ranging from 89-146 on Daily BMP/CMP; Today was 105 -Continue to Monitor Blood Sugars carefully and if necessary will place on Sensitive Novolog SSI AC  Hypokalemia, improved -Patient's K+ this AM was 3.8 -Mag Level was 2.0 -Continue to Monitor and Replete as Necessary -Repeat CMP in AM   Sinus Tachycardia -Patient is attributing it to drinking several coffee with espresso shots however it is Likely from him injecting himself while he is in the hospital through his midline -Cannot confirm but it is highly suspicious  -We will need to continue to monitor closely and will place a telemetry sitter -Continue with telemetry monitoring  Thrombocytosis -Worsening  and likely reactive -Platelet count went from 395 is now 584 -Continue to monitor and trend repeat CBC in a.m.  DVT prophylaxis: Enoxaparin 40 mg sq q24h Code Status: FULL CODE  Family Communication: No Family present at bedside  Disposition Plan: Pending further improvement and Clearance by ID  Consultants:   Cardiology for TEE  Infectious Diseases   PCCM/Pulmonary   [facility] notified    Procedures:  ECHOCARDIOGRAM 12/30/2018 IMPRESSIONS    1. Left ventricular ejection fraction, by visual estimation, is 60 to 65%. The left ventricle has normal function. Normal left ventricular size. There is no left ventricular hypertrophy.  2. Global right ventricle has normal systolic function.The right ventricular size is normal. No increase in right ventricular wall thickness.  3. Left atrial size was normal.  4. Right atrial size was normal.  5. The mitral valve is normal in structure. No evidence of mitral valve regurgitation. No evidence of mitral stenosis.  6. Thickened TV leaflet with possible mobile vegetation. ricuspid valve regurgitation is mild.  7. AV is poorly visualized but concerning in the parasternal long axis view for possible vegetation on the ventricular side of AV. Aortic valve regurgitation was not visualized by color flow Doppler.  8. The pulmonic valve was normal in structure. Pulmonic valve regurgitation is not visualized by color flow Doppler.  9. Normal pulmonary artery systolic pressure. 10. The inferior vena cava is normal in size with greater than 50% respiratory variability, suggesting right atrial pressure of 3 mmHg. 11. Recommend TEE for further evaluation for possible vegetation on AV and TV.  FINDINGS  Left Ventricle: Left ventricular ejection fraction, by visual estimation, is 60 to 65%. The left ventricle has normal function. There is no left ventricular hypertrophy. Normal left ventricular size.  Right Ventricle: The right ventricular  size is normal. No increase in right ventricular wall thickness. Global RV systolic function is has normal systolic function. The tricuspid regurgitant velocity is 2.13 m/s, and with an assumed right atrial pressure  of 3 mmHg, the estimated right ventricular systolic pressure is normal at 21.1 mmHg.  Left Atrium: Left atrial size was normal in size.  Right Atrium: Right atrial size was normal in size  Pericardium: There is no evidence of pericardial effusion.  Mitral Valve: The mitral valve is normal in structure. No evidence of mitral valve stenosis by observation. No evidence of mitral valve regurgitation.  Tricuspid Valve:Tricuspid valve regurgitation is mild by color flow Doppler. Thickened TV leaflet with possible mobile vegetation.    Aortic Valve: The aortic valve was not well visualized. Aortic valve regurgitation was not visualized by color flow Doppler. The aortic valve is structurally normal, with no evidence of sclerosis or stenosis. AV is poorly visualized but concerning in the  parasternal long axis view for possible vegetation on the ventricular side of AV.  Pulmonic  Valve: The pulmonic valve was normal in structure. Pulmonic valve regurgitation is not visualized by color flow Doppler.  Aorta: The aortic root, ascending aorta and aortic arch are all structurally normal, with no evidence of dilitation or obstruction.  Venous: The inferior vena cava is normal in size with greater than 50% respiratory variability, suggesting right atrial pressure of 3 mmHg.  IAS/Shunts: No atrial level shunt detected by color flow Doppler. No ventricular septal defect is seen or detected. There is no evidence of an atrial septal defect.     LEFT VENTRICLE PLAX 2D LVIDd:         4.20 cm  Diastology LVIDs:         2.90 cm  LV e' lateral:   12.40 cm/s LV PW:         1.20 cm  LV E/e' lateral: 4.2 LV IVS:        1.10 cm  LV e' medial:    16.20 cm/s LVOT diam:     2.20 cm  LV E/e'  medial:  3.2 LV SV:         46 ml LV SV Index:   28.28 LVOT Area:     3.80 cm    RIGHT VENTRICLE RV S prime:     14.10 cm/s TAPSE (M-mode): 2.6 cm  LEFT ATRIUM             Index LA diam:        2.80 cm 1.63 cm/m LA Vol (A2C):   23.8 ml 13.89 ml/m LA Vol (A4C):   34.9 ml 20.37 ml/m LA Biplane Vol: 31.1 ml 18.16 ml/m  AORTIC VALVE LVOT Vmax:   84.90 cm/s LVOT Vmean:  52.200 cm/s LVOT VTI:    0.126 m   AORTA Ao Asc diam: 2.90 cm  MITRAL VALVE                        TRICUSPID VALVE MV Area (PHT): 4.63 cm             TR Peak grad:   18.1 mmHg MV PHT:        47.56 msec           TR Vmax:        216.00 cm/s MV Decel Time: 164 msec MV E velocity: 52.30 cm/s 103 cm/s  SHUNTS MV A velocity: 42.00 cm/s 70.3 cm/s Systemic VTI:  0.13 m MV E/A ratio:  1.25       1.5       Systemic Diam: 2.20 cm  TEE 12/31/2018 IMPRESSIONS    1. Left ventricular ejection fraction, by visual estimation, is 60 to 65%. The left ventricle has normal function. Normal left ventricular size. There is no left ventricular hypertrophy.  2. Global right ventricle has normal systolic function.The right ventricular size is normal. No increase in right ventricular wall thickness.  3. Left atrial size was normal.  4. Right atrial size was normal.  5. The mitral valve is normal in structure. Trace mitral valve regurgitation. No evidence of mitral stenosis.  6. Small vegetation on the tricuspid valve. There is a probable perforation of the anterior leaflet, adjacent to the vegetation.  7. The tricuspid valve is normal in structure. Tricuspid valve regurgitation severe.  8. The aortic valve is normal in structure. Aortic valve regurgitation was not visualized by color flow Doppler. Structurally normal aortic valve, with no evidence of sclerosis or stenosis.  9. The pulmonic valve was normal  in structure. Pulmonic valve regurgitation is not visualized by color flow Doppler. 10. Mildly elevated pulmonary artery  systolic pressure.  FINDINGS  Left Ventricle: Left ventricular ejection fraction, by visual estimation, is 60 to 65%. The left ventricle has normal function. No evidence of left ventricular regional wall motion abnormalities. There is no left ventricular hypertrophy. Normal left  ventricular size.  Right Ventricle: The right ventricular size is normal. No increase in right ventricular wall thickness. Global RV systolic function is has normal systolic function. The tricuspid regurgitant velocity is 2.65 m/s, and with an assumed right atrial pressure  of 8 mmHg, the estimated right ventricular systolic pressure is mildly elevated at 36.1 mmHg.  Left Atrium: Left atrial size was normal in size.  Right Atrium: Right atrial size was normal in size  Pericardium: There is no evidence of pericardial effusion.  Mitral Valve: The mitral valve is normal in structure. No evidence of mitral valve stenosis by observation. Trace mitral valve regurgitation. There is no evidence of mitral valve vegetation.  Tricuspid Valve: The tricuspid valve is normal in structure. Tricuspid valve regurgitation severe by color flow Doppler. There is a small non-mobile tricuspid valve vegetation on the anterior leaflet. The TV vegetation measures 5 mm x 7 mm. There appears  to be a 3 mm diameter perforation in the anterior leaflet.  Aortic Valve: The aortic valve is normal in structure. Aortic valve regurgitation was not visualized by color flow Doppler. The aortic valve is structurally normal, with no evidence of sclerosis or stenosis.  Pulmonic Valve: The pulmonic valve was normal in structure. Pulmonic valve regurgitation is not visualized by color flow Doppler.  Aorta: The aortic root, ascending aorta and aortic arch are all structurally normal, with no evidence of dilitation or obstruction.  Shunts: No ventricular septal defect is seen or detected. There is no evidence of an atrial septal defect. No atrial  level shunt detected by color flow Doppler.    TRICUSPID VALVE             Normals TR Peak grad:   28.1 mmHg TR Vmax:        265.00 cm/s 288 cm/s  Antimicrobials:  Anti-infectives (From admission, onward)   Start     Dose/Rate Route Frequency Ordered Stop   01/04/19 1000  vancomycin (VANCOCIN) 1,250 mg in sodium chloride 0.9 % 250 mL IVPB     1,250 mg 166.7 mL/hr over 90 Minutes Intravenous Every 12 hours 01/04/19 0859     01/02/19 2000  vancomycin (VANCOCIN) 1,750 mg in sodium chloride 0.9 % 500 mL IVPB  Status:  Discontinued     1,750 mg 250 mL/hr over 120 Minutes Intravenous Every 12 hours 01/02/19 1227 01/04/19 0859   12/31/18 1000  vancomycin (VANCOCIN) 1,250 mg in sodium chloride 0.9 % 250 mL IVPB  Status:  Discontinued     1,250 mg 166.7 mL/hr over 90 Minutes Intravenous Every 12 hours 12/31/18 0929 01/02/19 1227   12/28/18 1800  vancomycin (VANCOCIN) IVPB 1000 mg/200 mL premix  Status:  Discontinued     1,000 mg 200 mL/hr over 60 Minutes Intravenous 2 times daily 12/28/18 0914 12/31/18 0929   12/28/18 1400  ceFEPIme (MAXIPIME) 2 g in sodium chloride 0.9 % 100 mL IVPB  Status:  Discontinued     2 g 200 mL/hr over 30 Minutes Intravenous Every 8 hours 12/28/18 0914 12/29/18 1542   12/28/18 0730  vancomycin (VANCOCIN) 1,250 mg in sodium chloride 0.9 % 250 mL IVPB  1,250 mg 166.7 mL/hr over 90 Minutes Intravenous STAT 12/28/18 0715 12/28/18 0935   12/28/18 0730  ceFEPIme (MAXIPIME) 2 g in sodium chloride 0.9 % 100 mL IVPB     2 g 200 mL/hr over 30 Minutes Intravenous STAT 12/28/18 0715 12/28/18 0833     Subjective: Seen and examined at bedside and he was standing up next to the counter and drawing on his scratch book.  States that he "got anxious about his heart rate after drinking the coffee".  Denies any chest pain, but still has some back pain states that he is able to take a deeper breath now.  It is highly suspicious that the patient has injected himself into his  midline as his sister has been bringing in things nightly and father notified nursing staff that the sister may have brought the patient some substances to inject.  Will need to continue to monitor patient closely with a Geographical information systems officer. No other concerns or complaints at this time.   Objective: Vitals:   01/05/19 0301 01/05/19 0704 01/05/19 1036 01/05/19 1512  BP: 119/81 126/74 118/74 125/89  Pulse: (!) 109 (!) 108 (!) 112 (!) 110  Resp: Temp: 98.4 F (36.9 C) 98.4 F (36.9 C) 98.4 F (36.9 C) 97.7 F (36.5 C)  TempSrc:  Oral Oral Oral  SpO2: 99% 98% 100% 100%  Weight:      Height:        Intake/Output Summary (Last 24 hours) at 01/05/2019 1632 Last data filed at 01/05/2019 6295 Gross per 24 hour  Intake 550 ml  Output 650 ml  Net -100 ml   Filed Weights   12/28/18 0040 12/28/18 1254  Weight: 56.7 kg 54.3 kg   Examination: Physical Exam:  Constitutional: Thin Caucasian male in NAD and appears mildly anxious today  Eyes: Lids and conjunctivae normal, sclerae anicteric  ENMT: External Ears, Nose appear normal. Grossly normal hearing. Mucous membranes are moist. Posterior pharynx clear of any exudate or lesions. Normal dentition.  Neck: Appears normal, supple, no cervical masses, normal ROM, no appreciable thyromegaly Respiratory: Diminished to auscultation bilaterally, no wheezing, rales, rhonchi or crackles. Normal respiratory effort and patient is not tachypenic. No accessory muscle use. Unlabored breathing  Cardiovascular: Tachycardic Rate but regular rhythm, no murmurs / rubs / gallops. S1 and S2 auscultated. No lower extremity edema  Abdomen: Soft, non-tender, non-distended.  Bowel sounds positive x4.  GU: Deferred. Musculoskeletal: No clubbing / cyanosis of digits/nails. No joint deformity upper and lower extremities.   Skin: No rashes, lesions, ulcer but has tract marks on arms from injecting and a Tattoo on Ri. No induration; Warm and dry.  Neurologic:  CN 2-12 grossly intact with no focal deficits. Romberg sign cerebellar reflexes not assessed.  Psychiatric: Normal judgment and insight. Alert and oriented x 3. Slightly anxious mood and appropriate affect.   Data Reviewed: I have personally reviewed following labs and imaging studies  CBC: Recent Labs  Lab 01/01/19 0342 01/02/19 0448 01/03/19 0334 01/04/19 0110 01/05/19 0412  WBC 10.0 10.6* 10.3 8.3 11.5*  NEUTROABS 6.9 6.1 6.3  --  8.0*  HGB 9.8* 10.6* 10.1* 10.2* 11.5*  HCT 29.8* 32.8* 31.0* 31.9* 35.9*  MCV 88.4 90.4 89.6 91.9 91.3  PLT 265 338 395 446* 548*   Basic Metabolic Panel: Recent Labs  Lab 01/01/19 0342 01/02/19 0448 01/03/19 0334 01/04/19 0110 01/05/19 0412  NA 138 137 140 138 139  K 3.5 3.9 3.9 3.8 3.8  CL 106  105 106 102 101  CO2 GLUCOSE 125* 94 122* 112* 105*  BUN 5* CREATININE 0.51* 0.56* 0.51* 0.61 0.64  CALCIUM 8.2* 8.3* 8.5* 8.5* 9.4  MG 1.9 1.8 1.9 2.0 2.1  PHOS 3.3 3.5 4.4 3.4 3.9   GFR: Estimated Creatinine Clearance: 100.9 mL/min (by C-G formula based on SCr of 0.64 mg/dL). Liver Function Tests: Recent Labs  Lab 01/01/19 0342 01/02/19 0448 01/03/19 0334 01/05/19 0412  AST 36  ALT 27 30 35 59*  ALKPHOS 151* 155* 150* 180*  BILITOT 0.4 0.2* 0.2* 0.4  PROT 5.9* 6.2* 6.4* 7.8  ALBUMIN 2.2* 2.2* 2.4* 2.9*   No results for input(s): LIPASE, AMYLASE in the last 168 hours. No results for input(s): AMMONIA in the last 168 hours. Coagulation Profile: No results for input(s): INR, PROTIME in the last 168 hours. Cardiac Enzymes: No results for input(s): CKTOTAL, CKMB, CKMBINDEX, TROPONINI in the last 168 hours. BNP (last 3 results) No results for input(s): PROBNP in the last 8760 hours. HbA1C: No results for input(s): HGBA1C in the last 72 hours. CBG: Recent Labs  Lab 01/01/19 1854 01/02/19 0004 01/02/19 0533 01/02/19 1313 01/04/19 0007  GLUCAP 107* 126* 95 103* 117*   Lipid Profile: No  results for input(s): CHOL, HDL, LDLCALC, TRIG, CHOLHDL, LDLDIRECT in the last 72 hours. Thyroid Function Tests: No results for input(s): TSH, T4TOTAL, FREET4, T3FREE, THYROIDAB in the last 72 hours. Anemia Panel: No results for input(s): VITAMINB12, FOLATE, FERRITIN, TIBC, IRON, RETICCTPCT in the last 72 hours. Sepsis Labs: No results for input(s): PROCALCITON, LATICACIDVEN in the last 168 hours.  Recent Results (from the past 240 hour(s))  SARS CORONAVIRUS 2 (TAT 6-24 HRS) Nasopharyngeal Nasopharyngeal Swab     Status: None   Collection Time: 12/28/18  5:37 AM   Specimen: Nasopharyngeal Swab  Result Value Ref Range Status   SARS Coronavirus 2 NEGATIVE NEGATIVE Final    Comment: (NOTE) SARS-CoV-2 target nucleic acids are NOT DETECTED. The SARS-CoV-2 RNA is generally detectable in upper and lower respiratory specimens during the acute phase of infection. Negative results do not preclude SARS-CoV-2 infection, do not rule out co-infections with other pathogens, and should not be used as the sole basis for treatment or other patient management decisions. Negative results must be combined with clinical observations, patient history, and epidemiological information. The expected result is Negative. Fact Sheet for Patients: HairSlick.no Fact Sheet for Healthcare Providers: quierodirigir.com This test is not yet approved or cleared by the [place] FDA and  has been authorized for detection and/or diagnosis of SARS-CoV-2 by FDA under an Emergency Use Authorization (EUA). This EUA will remain  in effect (meaning this test can be used) for the duration of the COVID-19 declaration under Section 56 4(b)(1) of the Act, 21 U.S.C. section 360bbb-3(b)(1), unless the authorization is terminated or revoked sooner. Performed at [facility], [address], [place], [place] [address]   Blood culture (routine x 2)     Status: Abnormal    Collection Time: 12/28/18  7:47 AM   Specimen: BLOOD RIGHT ARM  Result Value Ref Range Status   Specimen Description   Final    BLOOD RIGHT ARM Performed at [facility], [address], [place], [place] [address]    Special Requests   Final    BOTTLES DRAWN AEROBIC AND ANAEROBIC Blood Culture results may not be optimal due to an excessive volume of  blood received in culture bottles Performed at [facility], [address] [place], [place] [address]    Culture  Setup Time   Final    GRAM POSITIVE COCCI IN BOTH AEROBIC AND ANAEROBIC BOTTLES CRITICAL RESULT CALLED TO, READ BACK BY AND VERIFIED WITH: PHARMD JUSTIN L 1948 [id number] [facility] CRITICAL RESULT CALLED TO, READ BACK BY AND VERIFIED WITH: B GREEN,PHARMD AT 2206 12/28/2018 BY L BENFIELD Performed at [facility], [address], [place], [place] [address]    Culture METHICILLIN RESISTANT STAPHYLOCOCCUS AUREUS (A)  Final   Report Status 12/30/2018 FINAL  Final   Organism ID, Bacteria METHICILLIN RESISTANT STAPHYLOCOCCUS AUREUS  Final      Susceptibility   Methicillin resistant staphylococcus aureus - MIC*    CIPROFLOXACIN >=8 RESISTANT Resistant     ERYTHROMYCIN >=8 RESISTANT Resistant     GENTAMICIN <=0.5 SENSITIVE Sensitive     OXACILLIN >=4 RESISTANT Resistant     TETRACYCLINE <=1 SENSITIVE Sensitive     VANCOMYCIN <=0.5 SENSITIVE Sensitive     TRIMETH/SULFA <=10 SENSITIVE Sensitive     CLINDAMYCIN <=0.25 SENSITIVE Sensitive     RIFAMPIN <=0.5 SENSITIVE Sensitive     Inducible Clindamycin NEGATIVE Sensitive     * METHICILLIN RESISTANT STAPHYLOCOCCUS AUREUS  Blood culture (routine x 2)     Status: Abnormal   Collection Time: 12/28/18  7:47 AM   Specimen: BLOOD  Result Value Ref Range Status   Specimen Description   Final    BLOOD RIGHT WRIST Performed at [facility], [address] [place], [place] [address]    Special Requests   Final    BOTTLES DRAWN AEROBIC AND  ANAEROBIC Blood Culture results may not be optimal due to an inadequate volume of blood received in culture bottles Performed at [facility], [address], [place], [place] [address]    Culture  Setup Time   Final    GRAM POSITIVE COCCI IN CLUSTERS IN BOTH AEROBIC AND ANAEROBIC BOTTLES CRITICAL VALUE NOTED.  VALUE IS CONSISTENT WITH PREVIOUSLY REPORTED AND CALLED VALUE.    Culture (A)  Final    STAPHYLOCOCCUS AUREUS SUSCEPTIBILITIES PERFORMED ON PREVIOUS CULTURE WITHIN THE LAST 5 DAYS. Performed at [facility], [address] [place], [place] [address]    Report Status 12/30/2018 FINAL  Final  Urine culture     Status: Abnormal   Collection Time: 12/28/18  7:47 AM   Specimen: In/Out Cath Urine  Result Value Ref Range Status   Specimen Description   Final    IN/OUT CATH URINE Performed at [facility], [address], [place], [place] [address]    Special Requests   Final    NONE Performed at [facility], [address], [place], [place] [address]    Culture (A)  Final    30,000 COLONIES/mL METHICILLIN RESISTANT STAPHYLOCOCCUS AUREUS   Report Status 12/30/2018 FINAL  Final   Organism ID, Bacteria METHICILLIN RESISTANT STAPHYLOCOCCUS AUREUS (A)  Final      Susceptibility   Methicillin resistant staphylococcus aureus - MIC*    CIPROFLOXACIN >=8 RESISTANT Resistant     GENTAMICIN <=0.5 SENSITIVE Sensitive     NITROFURANTOIN <=16 SENSITIVE Sensitive     OXACILLIN >=4 RESISTANT Resistant     TETRACYCLINE <=1 SENSITIVE Sensitive     VANCOMYCIN 1 SENSITIVE Sensitive     TRIMETH/SULFA <=10 SENSITIVE Sensitive     CLINDAMYCIN <=0.25 SENSITIVE Sensitive  RIFAMPIN <=0.5 SENSITIVE Sensitive     Inducible Clindamycin NEGATIVE Sensitive     * 30,000 COLONIES/mL METHICILLIN RESISTANT STAPHYLOCOCCUS AUREUS  Blood Culture ID Panel (Reflexed)     Status: Abnormal   Collection Time: 12/28/18  7:47 AM  Result Value Ref  Range Status   Enterococcus species NOT DETECTED NOT DETECTED Final   Listeria monocytogenes NOT DETECTED NOT DETECTED Final   Staphylococcus species DETECTED (A) NOT DETECTED Final    Comment: CRITICAL RESULT CALLED TO, READ BACK BY AND VERIFIED WITH: B GREEN,PHARMD AT 2206 12/28/2018 BY L BENFIELD    Staphylococcus aureus (BCID) DETECTED (A) NOT DETECTED Final    Comment: Methicillin (oxacillin)-resistant Staphylococcus aureus (MRSA). MRSA is predictably resistant to beta-lactam antibiotics (except ceftaroline). Preferred therapy is vancomycin unless clinically contraindicated. Patient requires contact precautions if  hospitalized. CRITICAL RESULT CALLED TO, READ BACK BY AND VERIFIED WITH: B GREEN,PHARMD AT 2206 12/28/2018 BY L BENFIELD    Methicillin resistance DETECTED (A) NOT DETECTED Final    Comment: CRITICAL RESULT CALLED TO, READ BACK BY AND VERIFIED WITH: B GREEN,PHARMD AT 2206 12/28/2018 BY L BENFIELD    Streptococcus species NOT DETECTED NOT DETECTED Final   Streptococcus agalactiae NOT DETECTED NOT DETECTED Final   Streptococcus pneumoniae NOT DETECTED NOT DETECTED Final   Streptococcus pyogenes NOT DETECTED NOT DETECTED Final   Acinetobacter baumannii NOT DETECTED NOT DETECTED Final   Enterobacteriaceae species NOT DETECTED NOT DETECTED Final   Enterobacter cloacae complex NOT DETECTED NOT DETECTED Final   Escherichia coli NOT DETECTED NOT DETECTED Final   Klebsiella oxytoca NOT DETECTED NOT DETECTED Final   Klebsiella pneumoniae NOT DETECTED NOT DETECTED Final   Proteus species NOT DETECTED NOT DETECTED Final   Serratia marcescens NOT DETECTED NOT DETECTED Final   Haemophilus influenzae NOT DETECTED NOT DETECTED Final   Neisseria meningitidis NOT DETECTED NOT DETECTED Final   Pseudomonas aeruginosa NOT DETECTED NOT DETECTED Final   Candida albicans NOT DETECTED NOT DETECTED Final   Candida glabrata NOT DETECTED NOT DETECTED Final   Candida krusei NOT DETECTED NOT  DETECTED Final   Candida parapsilosis NOT DETECTED NOT DETECTED Final   Candida tropicalis NOT DETECTED NOT DETECTED Final    Comment: Performed at [facility], [address] [place], [place] [address]  MRSA PCR Screening     Status: Abnormal   Collection Time: 12/28/18 12:51 PM   Specimen: Nasal Mucosa; Nasopharyngeal  Result Value Ref Range Status   MRSA by PCR POSITIVE (A) NEGATIVE Final    Comment:        The GeneXpert MRSA Assay (FDA approved for NASAL specimens only), is one component of a comprehensive MRSA colonization surveillance program. It is not intended to diagnose MRSA infection nor to guide or monitor treatment for MRSA infections. RESULT CALLED TO, READ BACK BY AND VERIFIED WITH: K ZULETA,RN 12/28/18 1507 RHOLMES Performed at [facility], [address], [place], [place] [address]   Respiratory Panel by PCR     Status: None   Collection Time: 12/28/18  1:59 PM   Specimen: Nasopharyngeal Swab; Respiratory  Result Value Ref Range Status   Adenovirus NOT DETECTED NOT DETECTED Final   Coronavirus 229E NOT DETECTED NOT DETECTED Final    Comment: (NOTE) The Coronavirus on the Respiratory Panel, DOES NOT test for the novel  Coronavirus (2019 nCoV)    Coronavirus HKU1 NOT DETECTED NOT DETECTED Final   Coronavirus NL63 NOT DETECTED NOT DETECTED Final   Coronavirus OC43 NOT DETECTED  NOT DETECTED Final   Metapneumovirus NOT DETECTED NOT DETECTED Final   Rhinovirus / Enterovirus NOT DETECTED NOT DETECTED Final   Influenza A NOT DETECTED NOT DETECTED Final   Influenza B NOT DETECTED NOT DETECTED Final   Parainfluenza Virus 1 NOT DETECTED NOT DETECTED Final   Parainfluenza Virus 2 NOT DETECTED NOT DETECTED Final   Parainfluenza Virus 3 NOT DETECTED NOT DETECTED Final   Parainfluenza Virus 4 NOT DETECTED NOT DETECTED Final   Respiratory Syncytial Virus NOT DETECTED NOT DETECTED Final   Bordetella pertussis NOT DETECTED NOT DETECTED Final    Chlamydophila pneumoniae NOT DETECTED NOT DETECTED Final   Mycoplasma pneumoniae NOT DETECTED NOT DETECTED Final    Comment: Performed at [facility], [address] [place], [place] [address]  SARS Coronavirus 2 by RT PCR (hospital order, performed in [facility] hospital lab) Nasopharyngeal Nasopharyngeal Swab     Status: None   Collection Time: 12/28/18  1:59 PM   Specimen: Nasopharyngeal Swab  Result Value Ref Range Status   SARS Coronavirus 2 NEGATIVE NEGATIVE Final    Comment: (NOTE) If result is NEGATIVE SARS-CoV-2 target nucleic acids are NOT DETECTED. The SARS-CoV-2 RNA is generally detectable in upper and lower  respiratory specimens during the acute phase of infection. The lowest  concentration of SARS-CoV-2 viral copies this assay can detect is 250  copies / mL. A negative result does not preclude SARS-CoV-2 infection  and should not be used as the sole basis for treatment or other  patient management decisions.  A negative result may occur with  improper specimen collection / handling, submission of specimen other  than nasopharyngeal swab, presence of viral mutation(s) within the  areas targeted by this assay, and inadequate number of viral copies  (<250 copies / mL). A negative result must be combined with clinical  observations, patient history, and epidemiological information. If result is POSITIVE SARS-CoV-2 target nucleic acids are DETECTED. The SARS-CoV-2 RNA is generally detectable in upper and lower  respiratory specimens dur ing the acute phase of infection.  Positive  results are indicative of active infection with SARS-CoV-2.  Clinical  correlation with patient history and other diagnostic information is  necessary to determine patient infection status.  Positive results do  not rule out bacterial infection or co-infection with other viruses. If result is PRESUMPTIVE POSTIVE SARS-CoV-2 nucleic acids MAY BE PRESENT.   A presumptive positive result was  obtained on the submitted specimen  and confirmed on repeat testing.  While 2019 novel coronavirus  (SARS-CoV-2) nucleic acids may be present in the submitted sample  additional confirmatory testing may be necessary for epidemiological  and / or clinical management purposes  to differentiate between  SARS-CoV-2 and other Sarbecovirus currently known to infect humans.  If clinically indicated additional testing with an alternate test  methodology [phone]) is advised. The SARS-CoV-2 RNA is generally  detectable in upper and lower respiratory sp ecimens during the acute  phase of infection. The expected result is Negative. Fact Sheet for Patients:  BoilerBrush.com.cy Fact Sheet for Healthcare Providers: https://pope.com/ This test is not yet approved or cleared by the [place] FDA and has been authorized for detection and/or diagnosis of SARS-CoV-2 by FDA under an Emergency Use Authorization (EUA).  This EUA will remain in effect (meaning this test can be used) for the duration of the COVID-19 declaration under Section 564(b)(1) of the Act, 21 U.S.C. section 360bbb-3(b)(1), unless the authorization is terminated or revoked sooner. Performed at [facility], [address], [place], [place] [address]   Culture, blood (Routine X 2) w Reflex to ID Panel     Status: None   Collection Time: 12/30/18  8:33 AM   Specimen: BLOOD RIGHT HAND  Result Value Ref Range Status   Specimen Description   Final    BLOOD RIGHT HAND Performed at [facility], [address], [place], [place] [address]    Special Requests   Final    BOTTLES DRAWN AEROBIC ONLY Blood Culture adequate volume Performed at [facility], [address], [place], [place] [address]    Culture   Final    NO GROWTH 5 DAYS Performed at [facility], [address], [place], [place] [address]    Report Status  01/04/2019 FINAL  Final    RN Pressure Injury Documentation:     Estimated body mass index is 16.24 kg/m as calculated from the following:   Height as of this encounter: 6' (1.829 m).   Weight as of this encounter: 54.3 kg.  Malnutrition Type:  Nutrition Problem: Increased nutrient needs Etiology: acute illness(sepsis)   Malnutrition Characteristics:  Signs/Symptoms: estimated needs   Nutrition Interventions:  Interventions: MVI, Prostat, Ensure Enlive (each supplement provides 350kcal and 20 grams of protein)   Radiology Studies: No results found.   Scheduled Meds:  chlordiazePOXIDE  10 mg Oral TID   Chlorhexidine Gluconate Cloth  6 each Topical Daily   enoxaparin (LOVENOX) injection  40 mg Subcutaneous Q24H   feeding supplement (ENSURE ENLIVE)  237 mL Oral BID BM   feeding supplement (PRO-STAT SUGAR FREE 64)  30 mL Oral BID   folic acid  1 mg Oral Daily   iron polysaccharides  150 mg Oral Daily   mouth rinse  15 mL Mouth Rinse BID   multivitamin with minerals  1 tablet Oral Daily   nicotine  21 mg Transdermal Daily   sodium chloride flush  10-40 mL Intracatheter Q12H   thiamine  100 mg Oral Daily   Or   thiamine  100 mg Intravenous Daily   Continuous Infusions:  vancomycin 1,250 mg (01/05/19 0833)    LOS: 8 days   Merlene Laughter, DO [facility] P[facility] is on AMION  If 7PM-7AM, please contact night-coverage [email or web address] Password [email or web address] 01/05/2019, 4:32 PM

## 2019-01-05 NOTE — Progress Notes (Signed)
Pt originally had his sister as the designated visitor. The sister is unable to come now because she is caring for a child. Pt made aware that his Father will be the only visitor for the duration of admission.

## 2019-01-06 ENCOUNTER — Inpatient Hospital Stay (HOSPITAL_COMMUNITY): Payer: Self-pay

## 2019-01-06 LAB — CBC WITH DIFFERENTIAL/PLATELET
Abs Immature Granulocytes: 0.06 10*3/uL (ref 0.00–0.07)
Basophils Absolute: 0.1 10*3/uL (ref 0.0–0.1)
Basophils Relative: 1 %
Eosinophils Absolute: 0.1 10*3/uL (ref 0.0–0.5)
Eosinophils Relative: 1 %
HCT: 35.3 % — ABNORMAL LOW (ref 39.0–52.0)
Hemoglobin: 11.3 g/dL — ABNORMAL LOW (ref 13.0–17.0)
Immature Granulocytes: 1 %
Lymphocytes Relative: 24 %
Lymphs Abs: 2.8 10*3/uL (ref 0.7–4.0)
MCH: 29 pg (ref 26.0–34.0)
MCHC: 32 g/dL (ref 30.0–36.0)
MCV: 90.7 fL (ref 80.0–100.0)
Monocytes Absolute: 0.9 10*3/uL (ref 0.1–1.0)
Monocytes Relative: 7 %
Neutro Abs: 7.7 10*3/uL (ref 1.7–7.7)
Neutrophils Relative %: 66 %
Platelets: 585 10*3/uL — ABNORMAL HIGH (ref 150–400)
RBC: 3.89 MIL/uL — ABNORMAL LOW (ref 4.22–5.81)
RDW: 13.5 % (ref 11.5–15.5)
WBC: 11.6 10*3/uL — ABNORMAL HIGH (ref 4.0–10.5)
nRBC: 0 % (ref 0.0–0.2)

## 2019-01-06 LAB — COMPREHENSIVE METABOLIC PANEL
ALT: 61 U/L — ABNORMAL HIGH (ref 0–44)
AST: 47 U/L — ABNORMAL HIGH (ref 15–41)
Albumin: 3.2 g/dL — ABNORMAL LOW (ref 3.5–5.0)
Alkaline Phosphatase: 167 U/L — ABNORMAL HIGH (ref 38–126)
Anion gap: 10 (ref 5–15)
BUN: 11 mg/dL (ref 6–20)
CO2: 26 mmol/L (ref 22–32)
Calcium: 9 mg/dL (ref 8.9–10.3)
Chloride: 102 mmol/L (ref 98–111)
Creatinine, Ser: 0.79 mg/dL (ref 0.61–1.24)
GFR calc Af Amer: >60 mL/min (ref >60)
GFR calc non Af Amer: >60 mL/min (ref >60)
Glucose, Bld: 114 mg/dL — ABNORMAL HIGH (ref 70–99)
Potassium: 3.9 mmol/L (ref 3.5–5.1)
Sodium: 138 mmol/L (ref 135–145)
Total Bilirubin: 0.6 mg/dL (ref 0.3–1.2)
Total Protein: 8 g/dL (ref 6.5–8.1)

## 2019-01-06 LAB — HEPATITIS PANEL, ACUTE
HCV Ab: NONREACTIVE
Hep A IgM: NONREACTIVE
Hep B C IgM: NONREACTIVE
Hepatitis B Surface Ag: NONREACTIVE

## 2019-01-06 LAB — MAGNESIUM: Magnesium: 2 mg/dL (ref 1.7–2.4)

## 2019-01-06 LAB — PHOSPHORUS: Phosphorus: 5 mg/dL — ABNORMAL HIGH (ref 2.5–4.6)

## 2019-01-06 MED ORDER — HYDROCODONE-ACETAMINOPHEN 5-325 MG PO TABS: 1.0000 | ORAL_TABLET | Freq: Four times a day (QID) | ORAL | Status: DC | PRN

## 2019-01-06 MED ORDER — SODIUM CHLORIDE 0.9 % IV SOLN
INTRAVENOUS | Status: DC
  Administered 2019-01-06: 11:00:00 via INTRAVENOUS

## 2019-01-06 MED ORDER — SULFAMETHOXAZOLE-TRIMETHOPRIM 800-160 MG PO TABS: 1.0000 | ORAL_TABLET | Freq: Two times a day (BID) | ORAL | 0 refills | Status: DC

## 2019-01-06 MED ORDER — SULFAMETHOXAZOLE-TRIMETHOPRIM 800-160 MG PO TABS: 1.0000 | ORAL_TABLET | Freq: Two times a day (BID) | ORAL | 0 refills | Status: AC

## 2019-01-06 MED ORDER — CHLORDIAZEPOXIDE HCL 5 MG PO CAPS
10.0000 mg | ORAL_CAPSULE | Freq: Two times a day (BID) | ORAL | Status: DC
  Administered 2019-01-06: 10 mg via ORAL
  Filled 2019-01-06: qty 2

## 2019-01-06 MED ORDER — LORAZEPAM 2 MG/ML IJ SOLN: 2.0000 mg | Freq: Four times a day (QID) | INTRAMUSCULAR | Status: DC | PRN

## 2019-01-06 MED ORDER — LORAZEPAM 2 MG/ML IJ SOLN: 2.0000 mg | INTRAMUSCULAR | Status: DC | PRN

## 2019-01-06 MED ORDER — SODIUM CHLORIDE 0.9 % IV BOLUS
1000.0000 mL | Freq: Once | INTRAVENOUS | Status: AC
  Administered 2019-01-06: 09:00:00 1000 mL via INTRAVENOUS

## 2019-01-06 MED ORDER — HYDROMORPHONE HCL 1 MG/ML IJ SOLN: 0.5000 mg | INTRAMUSCULAR | Status: DC | PRN

## 2019-01-06 MED ORDER — OXYCODONE HCL 5 MG PO TABS: 5.0000 mg | ORAL_TABLET | ORAL | Status: DC | PRN

## 2019-01-06 MED ORDER — HYDROXYZINE HCL 25 MG PO TABS: 25.0000 mg | ORAL_TABLET | Freq: Three times a day (TID) | ORAL | Status: DC | PRN

## 2019-01-06 NOTE — Progress Notes (Signed)
Pt c/o being anxious and not able to sleep. Ativan administered. Bed alarm set. Will continue to monitor.

## 2019-01-06 NOTE — Progress Notes (Addendum)
PROGRESS NOTE    Alex [name]  YQM:[id number] DOB: [birth year] DOA: 12/28/2018 PCP: Patient, No Pcp Per   Brief Narrative:  Patient is a 33 year old thin Caucasian male with a past medical history significant for but not limited to alcohol abuse, polysubstance abuse, asthma and other comorbidities who presented to the hospital with chief complaint of generalized body aches and chills.  Uses IV heroin and methamphetamines and last meth use was about a week ago and heroin use was prior to ED arrival.  In the ED he is noted to be febrile on septic without unknown etiology and UDS was positive for amphetamines and opiates.  Blood cultures were positive for MRSA and started on vancomycin.  Further work-up reveals that he has a tricuspid endocarditis with severe tricuspid insufficiency.  Blood cultures have been repeated on 10/20/202 and show NGTD at 3 Days.  We will have cardiothoracic surgery evaluate for further evaluation and recommendations and Dr. Cyndia Bent does not feel there is indication for surgery at this time.  Patient will need long-term antibiotics and ID is recommending at least 14 days of IV vancomycin and considering changing to p.o. patient still complaining of some back pain and is open to the idea of maybe trying Suboxone eventually.  Nursing reported that he has a friend that comes every evening and bring some stuff and patient locked himself in the bathroom however no contraband has been found.  Checked a UDS just in case and was Positive for Benzo's and Opiates.  The friend turned out to be his sister and patient's father suspect the patient's sister is bringing him medications to inject or other contraband.  Nurse reported that the midline IV was leaking and that the port closest to the site appeared to be leaking air bubbles.  It is likely the patient is injected into his port and a repeat UDS showed +Amphetamines, +Benzos, +Opites. The patient was placed with a telemetry sitter given the  suspicion that he is using his IV to inject himself with substances.  Today an empty bag was found on the patient's sock and I asked the patient about his recording he states that he took Adderall and cut it up into 3 pieces however there are still some suspicion that he was injecting into his port.  ID has changed the patient to p.o. antibiotics with Bactrim and he was given Oritavacin yesterday.  Will need to remove midline and will taper off his Librium (he was getting it TID scheduled) have cut down to twice daily today for at least 2 days and then will go 10 daily.  Of note patient also had some abnormal LFTs so we will check a right upper quadrant ultrasound and acute hepatitis panel and his WBC went up again as well as platelet count.  **ADDENDUM 17:15: I spoke with the patient at length today and he came clean and admitted that he had been lying entire time that he has been here.  Patient states that when he first came to the hospital he had a stash of drugs with him including methamphetamines and "dope" and that he was using the entire time that he was in the hospital and got scared when we put the telemetry sotteron.  He states that he knew it was counterintuitive having an endocarditis in him shooting up but he states that he did not want to get sick and he injected methamphetamines into his vein 3 days ago using a tourniquet that he got from the hospital  and an alcohol pad.  He states that he put the needle in the sharps box and then also had a stash of fentanyl with him which she was snorting and he snorted it last at 2:30 AM this morning.  His main concern is that he did not want to get "dope sick" and stated that he knew that Adderall would not cause a heart rate to go up the significantly.  He states that he got scared and had a change of heart and was going to leave AGAINST MEDICAL ADVICE to go to [facility] regional to try and get detoxed.  States that he is a heavy alcoholic and he drank 3  days prior to coming in.  His biggest concern was that he wants to get better and get treated and go to detox again eventually eventually.  I told him we are not a detox center and that only a limited amount of [place] can prescribe the long-acting opioid Suboxone or Subutex he really had to have a DEA-X license.  He states that the Librium really does not do much from and that he is out of his alcohol withdrawals now and does not really get shakes but states that he is afraid of getting "dope sick".  He states that he does not want to have any more Hydrocodone and wants to get better.  He is very forthcoming about what he had done in the hospital and states that he would snort his fentanyl when he would go to the bathroom and realized that because on when his heart rate started going up.  He states that he does not like Clonidine I discussed with him that he would like to detox and he would likely go to [facility] regional after he leaves here. Will not prescribe Subutex or Suboxone and in the interim will try to Provide some relief for his withdrawals (Opiate) and will be monitored opiates with as needed oxycodone 5 mg q. 4 as needed as well as subcu Dilaudid 0.5 mg p.o. 3h if as needed Oxycodone is not effective in the short-term as he is likely to be discharged in next 24 to 48 hours.  We will provide a social work consult to provide patient resources, have a Recruitment consultant at bedside and will need to discuss with the teaching team about starting Subutex and getting him into their clinic.  Will need to reach out to the IM teaching service in the a.m. to see if the patient can be started on Subutex and get into their clinic for monitored withdrawal.  I told him that I wanted to monitor him for medical stability however patient may however still leave AGAINST MEDICAL ADVICE and go to [facility] regional to try and get detoxed despite our efforts as he is afraid of going into "dope withdrawals".   Assessment & Plan:     Principal Problem:   Sepsis (HCC) Active Problems:   Tobacco use disorder   Polysubstance dependence (HCC)   Substance induced mood disorder (HCC)   IVDU (intravenous drug user)   Endocarditis of tricuspid valve  Sepsis without shock, likely from IV Drug Use MRSA bacteremia from Tricuspid Endocarditis  AMS in the setting of withdrawal and Bacteremia, Improved  -Mentation is greatly improved but was severely agitated and per nursing kept getting out of the bed and yelling at the Nursing staff but was calm today  -Ordered Echocardiogram to rule out infective endocarditis and was normal but TEE showed Tricuspid Valve Endocarditis  and Severe Tricuspid Insuffiencey  -CRP was 19.6 and LA was 1.3 -Continued IV Vancomycin-Day 8; Monitor Renal Function closely and Vancomycin peak was 15 and trough was 8 with a creatinine of 0.79 today; IV Vancomcyin now stopped and given a dose of Oritavancin Diphosphate 1200 mg x1  -Repeat surveillance cultures show NGTD at 5 Days; Will again repeat given suspected IVDU through his Midline and Blood Cx x2 on 10/ -Leukocytosis has worsened now and went from 8.3 is now 11.6 and likely in the setting of suspected intravenous drug use vs Oral Adderal Use? -Discontinued Foley -IV fluid resumed and will give a 1 Liter bolus and start on IV Maintenance at 75 mL/hr -ID following and Appreciate further evaluation and recommendations recommending cardiothoracic surgery evaluation I have reached out to the [facility] for formal evaluation given his tricuspid endocarditis along with severe tricuspid valve insufficiency and Dr. Laneta Simmers does not feel there is any indication for surgery at this point -Infectious diseases recommended to continue IV antibiotics with vancomycin and changing to p.o. antibiotics after 14 days possibly if he is compliant initially but after suspicion of Using Midline as direct access to Inject, ID gave the patient 1x of Oritavancin and have changed the patient  to po Bactrim for 6 weeks  -Continue to Monitor for S/Sx of Infection  -It is highly likely the patient has used his midline after direct access to inject substances into his vein.  Patient's sister is no longer allowed to visit and will place a telemetry sitter and repeat blood cultures and a UDS -We will monitor the patient closely and if necessary will need security search as well -Because his HR elevated again and WBC is worsening will continue to Monitor closely and anticipate D/C home in the Next few days when HR is improving and Weaned off of Librium   Hyponatremia, improved -Resolved with IVF Hydration and is now 138 -IVF hydration has been resumed  -Continue to Monitor and Trend -Repeat CMP in AM   Transaminitis/Abnormal LFTs, worsened now -AST was 57 and ALT was 57 and now improved as AST was 22 and ALT was 35 -Checked RUQ U/S and was read as a normal study; Will repeat RUQ now given worsening and obtain an Acute Hepatitis Panel  --n the setting of and likely doing illicit substances -Continue to monitor and trend LFTs -Started on IV fluid hydration with normal saline -Repeat CMP in a.m.   Polysubstance Abuse with concerns of Active Withdrawal, improving  Tobacco Use -C/w Withdrawal Protocol with IV Lorazepam 1-4 mg q1hprn and C/w IV Lorazepam 2 mg q4hprn Agitation -Continue Chlordiazepoxide 10 mg po TID to help with his withdrawal and have started  tapering and went to 10 mg po BID for 2 Days and will need to go 10 mg for 2 Days and stopped  -May consider starting Suboxone but will need a washout and given inability to trust patient will hold off. -C/w Nicotine 21 mg TD Patch -C/w Folic Acid 1 mg po Daily, MVI+Minerals, and Thiamine 100 mg po Daily  -C/w Haloperidol Lactate 5 mg IV q6hprn for Agitation -Is open to the idea of possibly starting Suboxone but I will not currently start it at this point given his admission of taking Adderall and ? Injection into his port  -IVF  resumed today  -Check UDS as Nursing reporting friendbringing stuff for the patient nightly; turned out to be a sister and patient's father believes that the patient may have gotten stuff from his  sister to inject into his midline; Repeat UDS +Amphetamines,+Benzo's,+Opiates now and he states that he took Adderall -As above; sister no longer allowed to visit and will continue a telemetry sitter given inability to trust patient as he had a empty baggie in his sock   History of Asthma -C/w Albuterol 2.5 mg Neg q2hprn Wheezing and SOB  Penile Lesion -STI work-up being done and RPR was Non-Reactive -HSV1 Glycoprotein was 55.40, HSV2 Glycoprotein was 1.67, HSV-2 IgG Supplemental Test was Postive and HSVI/II Comb IgM was 1.13 -Hepatitis was negative and GC/Chlaymidia is still pending currently and has not resulted yet -Infectious diseases was following appreciate their evaluation and recommendations  Underweight -Nutritionist consulted for further evaluation and recommendations -Patient is ordering Ensure Enlive twice daily low 30 mL with Prostat twice daily and continuing to encourage p.o. intakes  Normocytic Anemia -Patient's Hb/HCt is now 11.3/35.3 -Checked Anemia Panel and showed iron level of 16, U IBC of 168, TIBC of 184, saturation ratios of 9%, ferritin level 208, folate level of 8.9, vitamin B12 level 436 -Started the patient on iron polysaccharides 150 mg p.o. daily -Continue to Monitor for S/Sx of Bleeding -Repeat CBC in AM    Tricuspid Insuffiencey  -Seen on TEE and was noted to be Severe -Dr. Royann Shivers recommended treating Endocarditis and re-evaluating with Transthoracic ECHO after completion of IV Abx -We will have Cardiothoracic surgery evaluate and currently there is no indication for surgery at this point -Continued IV Abx and Treatment Course for Endocarditis but this is going to be changed to p.o. Bactrim for 6 weeks total as well as him getting a one-time dose of  Oritavancin  Hyperglycemia in the setting of Pre-Diabetes -Initially thought to be Reactive in the setting of infection -Checked HbA1c and was 6.1 -Blood Sugars have been ranging from 89-146 on Daily BMP/CMP; Today was 114 -Continue to Monitor Blood Sugars carefully and if necessary will place on Sensitive Novolog SSI AC  Hypokalemia, improved -Patient's K+ this AM was 3.8 -Mag Level was 2.0 -Continue to Monitor and Replete as Necessary -Repeat CMP in AM   Sinus Tachycardia -Patient is attributing it to drinking several coffee with espresso shots however it is Likely from him injecting himself while he is in the hospital through his midline; he now tells me that he took Adderall yesterday and cut it open to 3 pill pieces -Heart rate remains elevated -Cannot confirm but it is highly suspicious for injection into his port -Given a liter of normal saline bolus and started back on maintenance IV fluids at 75 mL's per hour for 1 day -We will need to continue to monitor closely and will place a telemetry sitter -Continue with telemetry monitoring  Thrombocytosis -Worsening and likely reactive in taking Adderall and do not know if he has injected it -Platelet count went from 395 is now 585 -Continue to monitor and trend repeat CBC in a.m.  Hyperphosphatemia -Patient's Phos Level was 5.0 -Continue to Monitor and Replete as Necessary -Repeat Phos Level in AM    DVT prophylaxis: Enoxaparin 40 mg sq q24h Code Status: FULL CODE  Family Communication: No Family present at bedside  Disposition Plan: Pending further improvement and Clearance by ID  Consultants:   Cardiology for TEE  Infectious Diseases   PCCM/Pulmonary   Cardiothoraic Surgery notified    Procedures:  ECHOCARDIOGRAM 12/30/2018 IMPRESSIONS    1. Left ventricular ejection fraction, by visual estimation, is 60 to 65%. The left ventricle has normal function. Normal left ventricular size. There  is no left  ventricular hypertrophy.  2. Global right ventricle has normal systolic function.The right ventricular size is normal. No increase in right ventricular wall thickness.  3. Left atrial size was normal.  4. Right atrial size was normal.  5. The mitral valve is normal in structure. No evidence of mitral valve regurgitation. No evidence of mitral stenosis.  6. Thickened TV leaflet with possible mobile vegetation. ricuspid valve regurgitation is mild.  7. AV is poorly visualized but concerning in the parasternal long axis view for possible vegetation on the ventricular side of AV. Aortic valve regurgitation was not visualized by color flow Doppler.  8. The pulmonic valve was normal in structure. Pulmonic valve regurgitation is not visualized by color flow Doppler.  9. Normal pulmonary artery systolic pressure. 10. The inferior vena cava is normal in size with greater than 50% respiratory variability, suggesting right atrial pressure of 3 mmHg. 11. Recommend TEE for further evaluation for possible vegetation on AV and TV.  FINDINGS  Left Ventricle: Left ventricular ejection fraction, by visual estimation, is 60 to 65%. The left ventricle has normal function. There is no left ventricular hypertrophy. Normal left ventricular size.  Right Ventricle: The right ventricular size is normal. No increase in right ventricular wall thickness. Global RV systolic function is has normal systolic function. The tricuspid regurgitant velocity is 2.13 m/s, and with an assumed right atrial pressure  of 3 mmHg, the estimated right ventricular systolic pressure is normal at 21.1 mmHg.  Left Atrium: Left atrial size was normal in size.  Right Atrium: Right atrial size was normal in size  Pericardium: There is no evidence of pericardial effusion.  Mitral Valve: The mitral valve is normal in structure. No evidence of mitral valve stenosis by observation. No evidence of mitral valve regurgitation.  Tricuspid  Valve:Tricuspid valve regurgitation is mild by color flow Doppler. Thickened TV leaflet with possible mobile vegetation.    Aortic Valve: The aortic valve was not well visualized. Aortic valve regurgitation was not visualized by color flow Doppler. The aortic valve is structurally normal, with no evidence of sclerosis or stenosis. AV is poorly visualized but concerning in the  parasternal long axis view for possible vegetation on the ventricular side of AV.  Pulmonic Valve: The pulmonic valve was normal in structure. Pulmonic valve regurgitation is not visualized by color flow Doppler.  Aorta: The aortic root, ascending aorta and aortic arch are all structurally normal, with no evidence of dilitation or obstruction.  Venous: The inferior vena cava is normal in size with greater than 50% respiratory variability, suggesting right atrial pressure of 3 mmHg.  IAS/Shunts: No atrial level shunt detected by color flow Doppler. No ventricular septal defect is seen or detected. There is no evidence of an atrial septal defect.     LEFT VENTRICLE PLAX 2D LVIDd:         4.20 cm  Diastology LVIDs:         2.90 cm  LV e' lateral:   12.40 cm/s LV PW:         1.20 cm  LV E/e' lateral: 4.2 LV IVS:        1.10 cm  LV e' medial:    16.20 cm/s LVOT diam:     2.20 cm  LV E/e' medial:  3.2 LV SV:         46 ml LV SV Index:   28.28 LVOT Area:     3.80 cm    RIGHT VENTRICLE RV S  prime:     14.10 cm/s TAPSE (M-mode): 2.6 cm  LEFT ATRIUM             Index LA diam:        2.80 cm 1.63 cm/m LA Vol (A2C):   23.8 ml 13.89 ml/m LA Vol (A4C):   34.9 ml 20.37 ml/m LA Biplane Vol: 31.1 ml 18.16 ml/m  AORTIC VALVE LVOT Vmax:   84.90 cm/s LVOT Vmean:  52.200 cm/s LVOT VTI:    0.126 m   AORTA Ao Asc diam: 2.90 cm  MITRAL VALVE                        TRICUSPID VALVE MV Area (PHT): 4.63 cm             TR Peak grad:   18.1 mmHg MV PHT:        47.56 msec           TR Vmax:        216.00  cm/s MV Decel Time: 164 msec MV E velocity: 52.30 cm/s 103 cm/s  SHUNTS MV A velocity: 42.00 cm/s 70.3 cm/s Systemic VTI:  0.13 m MV E/A ratio:  1.25       1.5       Systemic Diam: 2.20 cm  TEE 12/31/2018 IMPRESSIONS    1. Left ventricular ejection fraction, by visual estimation, is 60 to 65%. The left ventricle has normal function. Normal left ventricular size. There is no left ventricular hypertrophy.  2. Global right ventricle has normal systolic function.The right ventricular size is normal. No increase in right ventricular wall thickness.  3. Left atrial size was normal.  4. Right atrial size was normal.  5. The mitral valve is normal in structure. Trace mitral valve regurgitation. No evidence of mitral stenosis.  6. Small vegetation on the tricuspid valve. There is a probable perforation of the anterior leaflet, adjacent to the vegetation.  7. The tricuspid valve is normal in structure. Tricuspid valve regurgitation severe.  8. The aortic valve is normal in structure. Aortic valve regurgitation was not visualized by color flow Doppler. Structurally normal aortic valve, with no evidence of sclerosis or stenosis.  9. The pulmonic valve was normal in structure. Pulmonic valve regurgitation is not visualized by color flow Doppler. 10. Mildly elevated pulmonary artery systolic pressure.  FINDINGS  Left Ventricle: Left ventricular ejection fraction, by visual estimation, is 60 to 65%. The left ventricle has normal function. No evidence of left ventricular regional wall motion abnormalities. There is no left ventricular hypertrophy. Normal left  ventricular size.  Right Ventricle: The right ventricular size is normal. No increase in right ventricular wall thickness. Global RV systolic function is has normal systolic function. The tricuspid regurgitant velocity is 2.65 m/s, and with an assumed right atrial pressure  of 8 mmHg, the estimated right ventricular systolic pressure is mildly  elevated at 36.1 mmHg.  Left Atrium: Left atrial size was normal in size.  Right Atrium: Right atrial size was normal in size  Pericardium: There is no evidence of pericardial effusion.  Mitral Valve: The mitral valve is normal in structure. No evidence of mitral valve stenosis by observation. Trace mitral valve regurgitation. There is no evidence of mitral valve vegetation.  Tricuspid Valve: The tricuspid valve is normal in structure. Tricuspid valve regurgitation severe by color flow Doppler. There is a small non-mobile tricuspid valve vegetation on the anterior leaflet. The TV vegetation measures 5 mm x 7  mm. There appears  to be a 3 mm diameter perforation in the anterior leaflet.  Aortic Valve: The aortic valve is normal in structure. Aortic valve regurgitation was not visualized by color flow Doppler. The aortic valve is structurally normal, with no evidence of sclerosis or stenosis.  Pulmonic Valve: The pulmonic valve was normal in structure. Pulmonic valve regurgitation is not visualized by color flow Doppler.  Aorta: The aortic root, ascending aorta and aortic arch are all structurally normal, with no evidence of dilitation or obstruction.  Shunts: No ventricular septal defect is seen or detected. There is no evidence of an atrial septal defect. No atrial level shunt detected by color flow Doppler.    TRICUSPID VALVE             Normals TR Peak grad:   28.1 mmHg TR Vmax:        265.00 cm/s 288 cm/s  Antimicrobials:  Anti-infectives (From admission, onward)   Start     Dose/Rate Route Frequency Ordered Stop   01/06/19 1000  sulfamethoxazole-trimethoprim (BACTRIM DS) 800-160 MG per tablet 1 tablet     1 tablet Oral Every 12 hours 01/05/19 1717 02/17/19 0959   01/05/19 1800  Oritavancin Diphosphate (ORBACTIV) 1,200 mg in dextrose 5 % IVPB     1,200 mg 333.3 mL/hr over 180 Minutes Intravenous Once 01/05/19 1714 01/05/19 2141   01/04/19 1000  vancomycin (VANCOCIN)  1,250 mg in sodium chloride 0.9 % 250 mL IVPB  Status:  Discontinued     1,250 mg 166.7 mL/hr over 90 Minutes Intravenous Every 12 hours 01/04/19 0859 01/05/19 1714   01/02/19 2000  vancomycin (VANCOCIN) 1,750 mg in sodium chloride 0.9 % 500 mL IVPB  Status:  Discontinued     1,750 mg 250 mL/hr over 120 Minutes Intravenous Every 12 hours 01/02/19 1227 01/04/19 0859   12/31/18 1000  vancomycin (VANCOCIN) 1,250 mg in sodium chloride 0.9 % 250 mL IVPB  Status:  Discontinued     1,250 mg 166.7 mL/hr over 90 Minutes Intravenous Every 12 hours 12/31/18 0929 01/02/19 1227   12/28/18 1800  vancomycin (VANCOCIN) IVPB 1000 mg/200 mL premix  Status:  Discontinued     1,000 mg 200 mL/hr over 60 Minutes Intravenous 2 times daily 12/28/18 0914 12/31/18 0929   12/28/18 1400  ceFEPIme (MAXIPIME) 2 g in sodium chloride 0.9 % 100 mL IVPB  Status:  Discontinued     2 g 200 mL/hr over 30 Minutes Intravenous Every 8 hours 12/28/18 0914 12/29/18 1542   12/28/18 0730  vancomycin (VANCOCIN) 1,250 mg in sodium chloride 0.9 % 250 mL IVPB     1,250 mg 166.7 mL/hr over 90 Minutes Intravenous STAT 12/28/18 0715 12/28/18 0935   12/28/18 0730  ceFEPIme (MAXIPIME) 2 g in sodium chloride 0.9 % 100 mL IVPB     2 g 200 mL/hr over 30 Minutes Intravenous STAT 12/28/18 0715 12/28/18 0833     Subjective: Seen and examined at bedside and he was pacing around his room.  States that his back pain is improved.  No nausea or vomiting.  I asked him about why his urine drug screen was positive for amphetamines and he told me that he took Adderall and that he had cut it up into 3 pieces and was taking the pills.  Also states that he has been drinking a lot of espresso.  Cannot really tell me what happened with his port.  No chest pain, lightheadedness or dizziness.  I spoke to him about  his abnormal LFTs and work-up and why he was started back on fluids.  We will need to continue to monitor patient extremely carefully and will continue  telemetry sitter for now.  Objective: Vitals:   01/05/19 1921 01/06/19 0316 01/06/19 0536 01/06/19 1248  BP: 125/70 118/83 119/78 111/76  Pulse: 91 (!) 118 (!) 117 (!) 110  Resp: Temp: 97.6 F (36.4 C) 98 F (36.7 C) 97.6 F (36.4 C) 97.9 F (36.6 C)  TempSrc: Oral Oral Oral Oral  SpO2: 100%  99% 100%  Weight:      Height:        Intake/Output Summary (Last 24 hours) at 01/06/2019 1451 Last data filed at 01/06/2019 1200 Gross per 24 hour  Intake 2360 ml  Output 600 ml  Net 1760 ml   Filed Weights   12/28/18 0040 12/28/18 1254  Weight: 56.7 kg 54.3 kg   Examination: Physical Exam:  Constitutional: Thin Underweight Caucasian male in NAD and appears calm and anxious again  Eyes: Lids and conjunctivae normal, sclerae anicteric  ENMT: External Ears, Nose appear normal. Grossly normal hearing. Mucous membranes are moist.  Neck: Appears normal, supple, no cervical masses, normal ROM, no appreciable thyromegaly; no JVD Respiratory: Diminished to auscultation bilaterally, no wheezing, rales, rhonchi or crackles. Normal respiratory effort and patient is not tachypenic. No accessory muscle use. Unlabored breathing  Cardiovascular: Tachycardic Rate but Regular Rhythm, no murmurs / rubs / gallops. S1 and S2 auscultated. No extremity edema. Abdomen: Soft, non-tender, non-distended. Bowel sounds positive x4.  GU: Deferred. Musculoskeletal: No clubbing / cyanosis of digits/nails. No joint deformity upper and lower extremities.  Skin: No rashes, lesions, ulcers but has tract marks on arms from injecting and a Tattoo on his Right arm. No induration; Warm and dry.  Neurologic: CN 2-12 grossly intact with no focal deficits.  Romberg sign and cerebellar reflexes not assessed.  Psychiatric: Normal judgment and insight. Alert and oriented x 3. Anxious mood and appropriate affect.   Data Reviewed: I have personally reviewed following labs and imaging studies  CBC: Recent Labs    Lab 2019/01/19 0342 01/02/19 0448 01/03/19 0334 01/04/19 0110 01/05/19 0412 01/06/19 0621  WBC 10.0 10.6* 10.3 8.3 11.5* 11.6*  NEUTROABS 6.9 6.1 6.3  --  8.0* 7.7  HGB 9.8* 10.6* 10.1* 10.2* 11.5* 11.3*  HCT 29.8* 32.8* 31.0* 31.9* 35.9* 35.3*  MCV 88.4 90.4 89.6 91.9 91.3 90.7  PLT 265 338 395 446* 548* 585*   Basic Metabolic Panel: Recent Labs  Lab 01/02/19 0448 01/03/19 0334 01/04/19 0110 01/05/19 0412 01/06/19 0621  NA 137 140 138 139 138  K 3.9 3.9 3.8 3.8 3.9  CL 105 106 102 101 102  CO2 GLUCOSE 94 122* 112* 105* 114*  BUN CREATININE 0.56* 0.51* 0.61 0.64 0.79  CALCIUM 8.3* 8.5* 8.5* 9.4 9.0  MG 1.8 1.9 2.0 2.1 2.0  PHOS 3.5 4.4 3.4 3.9 5.0*   GFR: Estimated Creatinine Clearance: 100.9 mL/min (by C-G formula based on SCr of 0.79 mg/dL). Liver Function Tests: Recent Labs  Lab 01-19-19 0342 01/02/19 0448 01/03/19 0334 01/05/19 0412 01/06/19 0621  AST 36 47*  ALT 27 30 35 59* 61*  ALKPHOS 151* 155* 150* 180* 167*  BILITOT 0.4 0.2* 0.2* 0.4 0.6  PROT 5.9* 6.2* 6.4* 7.8 8.0  ALBUMIN 2.2* 2.2* 2.4* 2.9* 3.2*   No results for input(s): LIPASE, AMYLASE in  the last 168 hours. No results for input(s): AMMONIA in the last 168 hours. Coagulation Profile: No results for input(s): INR, PROTIME in the last 168 hours. Cardiac Enzymes: No results for input(s): CKTOTAL, CKMB, CKMBINDEX, TROPONINI in the last 168 hours. BNP (last 3 results) No results for input(s): PROBNP in the last 8760 hours. HbA1C: No results for input(s): HGBA1C in the last 72 hours. CBG: Recent Labs  Lab 01/01/19 1854 01/02/19 0004 01/02/19 0533 01/02/19 1313 01/04/19 0007  GLUCAP 107* 126* 95 103* 117*   Lipid Profile: No results for input(s): CHOL, HDL, LDLCALC, TRIG, CHOLHDL, LDLDIRECT in the last 72 hours. Thyroid Function Tests: No results for input(s): TSH, T4TOTAL, FREET4, T3FREE, THYROIDAB in the last 72 hours. Anemia Panel: No results  for input(s): VITAMINB12, FOLATE, FERRITIN, TIBC, IRON, RETICCTPCT in the last 72 hours. Sepsis Labs: No results for input(s): PROCALCITON, LATICACIDVEN in the last 168 hours.  Recent Results (from the past 240 hour(s))  SARS CORONAVIRUS 2 (TAT 6-24 HRS) Nasopharyngeal Nasopharyngeal Swab     Status: None   Collection Time: 12/28/18  5:37 AM   Specimen: Nasopharyngeal Swab  Result Value Ref Range Status   SARS Coronavirus 2 NEGATIVE NEGATIVE Final    Comment: (NOTE) SARS-CoV-2 target nucleic acids are NOT DETECTED. The SARS-CoV-2 RNA is generally detectable in upper and lower respiratory specimens during the acute phase of infection. Negative results do not preclude SARS-CoV-2 infection, do not rule out co-infections with other pathogens, and should not be used as the sole basis for treatment or other patient management decisions. Negative results must be combined with clinical observations, patient history, and epidemiological information. The expected result is Negative. Fact Sheet for Patients: HairSlick.no Fact Sheet for Healthcare Providers: quierodirigir.com This test is not yet approved or cleared by the [place] FDA and  has been authorized for detection and/or diagnosis of SARS-CoV-2 by FDA under an Emergency Use Authorization (EUA). This EUA will remain  in effect (meaning this test can be used) for the duration of the COVID-19 declaration under Section 56 4(b)(1) of the Act, 21 U.S.C. section 360bbb-3(b)(1), unless the authorization is terminated or revoked sooner. Performed at [facility], [address] [place], [place] [address]   Blood culture (routine x 2)     Status: Abnormal   Collection Time: 12/28/18  7:47 AM   Specimen: BLOOD RIGHT ARM  Result Value Ref Range Status   Specimen Description   Final    BLOOD RIGHT ARM Performed at [facility], [address], [place], [place]  [address]    Special Requests   Final    BOTTLES DRAWN AEROBIC AND ANAEROBIC Blood Culture results may not be optimal due to an excessive volume of blood received in culture bottles Performed at [facility], [address] [place], [place] [address]    Culture  Setup Time   Final    GRAM POSITIVE COCCI IN BOTH AEROBIC AND ANAEROBIC BOTTLES CRITICAL RESULT CALLED TO, READ BACK BY AND VERIFIED WITH: PHARMD JUSTIN L 1948 [id number] FCP CRITICAL RESULT CALLED TO, READ BACK BY AND VERIFIED WITH: B GREEN,PHARMD AT 2206 12/28/2018 BY L BENFIELD Performed at [facility], [address], [place], [place] [address]    Culture METHICILLIN RESISTANT STAPHYLOCOCCUS AUREUS (A)  Final   Report Status 12/30/2018 FINAL  Final   Organism ID, Bacteria METHICILLIN RESISTANT STAPHYLOCOCCUS AUREUS  Final      Susceptibility   Methicillin resistant staphylococcus aureus -  MIC*    CIPROFLOXACIN >=8 RESISTANT Resistant     ERYTHROMYCIN >=8 RESISTANT Resistant     GENTAMICIN <=0.5 SENSITIVE Sensitive     OXACILLIN >=4 RESISTANT Resistant     TETRACYCLINE <=1 SENSITIVE Sensitive     VANCOMYCIN <=0.5 SENSITIVE Sensitive     TRIMETH/SULFA <=10 SENSITIVE Sensitive     CLINDAMYCIN <=0.25 SENSITIVE Sensitive     RIFAMPIN <=0.5 SENSITIVE Sensitive     Inducible Clindamycin NEGATIVE Sensitive     * METHICILLIN RESISTANT STAPHYLOCOCCUS AUREUS  Blood culture (routine x 2)     Status: Abnormal   Collection Time: 12/28/18  7:47 AM   Specimen: BLOOD  Result Value Ref Range Status   Specimen Description   Final    BLOOD RIGHT WRIST Performed at [facility], [address] [place], [place] [address]    Special Requests   Final    BOTTLES DRAWN AEROBIC AND ANAEROBIC Blood Culture results may not be optimal due to an inadequate volume of blood received in culture bottles Performed at [facility], [address], [place], [place] [address]    Culture  Setup  Time   Final    GRAM POSITIVE COCCI IN CLUSTERS IN BOTH AEROBIC AND ANAEROBIC BOTTLES CRITICAL VALUE NOTED.  VALUE IS CONSISTENT WITH PREVIOUSLY REPORTED AND CALLED VALUE.    Culture (A)  Final    STAPHYLOCOCCUS AUREUS SUSCEPTIBILITIES PERFORMED ON PREVIOUS CULTURE WITHIN THE LAST 5 DAYS. Performed at [facility], [address], [place], [place] [address]    Report Status 12/30/2018 FINAL  Final  Urine culture     Status: Abnormal   Collection Time: 12/28/18  7:47 AM   Specimen: In/Out Cath Urine  Result Value Ref Range Status   Specimen Description   Final    IN/OUT CATH URINE Performed at [facility], [address] [place], [place] [address]    Special Requests   Final    NONE Performed at [facility], [address] [place], [place] [address]    Culture (A)  Final    30,000 COLONIES/mL METHICILLIN RESISTANT STAPHYLOCOCCUS AUREUS   Report Status 12/30/2018 FINAL  Final   Organism ID, Bacteria METHICILLIN RESISTANT STAPHYLOCOCCUS AUREUS (A)  Final      Susceptibility   Methicillin resistant staphylococcus aureus - MIC*    CIPROFLOXACIN >=8 RESISTANT Resistant     GENTAMICIN <=0.5 SENSITIVE Sensitive     NITROFURANTOIN <=16 SENSITIVE Sensitive     OXACILLIN >=4 RESISTANT Resistant     TETRACYCLINE <=1 SENSITIVE Sensitive     VANCOMYCIN 1 SENSITIVE Sensitive     TRIMETH/SULFA <=10 SENSITIVE Sensitive     CLINDAMYCIN <=0.25 SENSITIVE Sensitive     RIFAMPIN <=0.5 SENSITIVE Sensitive     Inducible Clindamycin NEGATIVE Sensitive     * 30,000 COLONIES/mL METHICILLIN RESISTANT STAPHYLOCOCCUS AUREUS  Blood Culture ID Panel (Reflexed)     Status: Abnormal   Collection Time: 12/28/18  7:47 AM  Result Value Ref Range Status   Enterococcus species NOT DETECTED NOT DETECTED Final   Listeria monocytogenes NOT DETECTED NOT DETECTED Final   Staphylococcus species DETECTED (A) NOT DETECTED Final    Comment: CRITICAL RESULT CALLED TO,  READ BACK BY AND VERIFIED WITH: B GREEN,PHARMD AT 2206 12/28/2018 BY L BENFIELD    Staphylococcus aureus (BCID) DETECTED (A) NOT DETECTED Final    Comment: Methicillin (oxacillin)-resistant Staphylococcus aureus (MRSA). MRSA is predictably resistant to beta-lactam antibiotics (except ceftaroline). Preferred  therapy is vancomycin unless clinically contraindicated. Patient requires contact precautions if  hospitalized. CRITICAL RESULT CALLED TO, READ BACK BY AND VERIFIED WITH: B GREEN,PHARMD AT 2206 12/28/2018 BY L BENFIELD    Methicillin resistance DETECTED (A) NOT DETECTED Final    Comment: CRITICAL RESULT CALLED TO, READ BACK BY AND VERIFIED WITH: B GREEN,PHARMD AT 2206 12/28/2018 BY L BENFIELD    Streptococcus species NOT DETECTED NOT DETECTED Final   Streptococcus agalactiae NOT DETECTED NOT DETECTED Final   Streptococcus pneumoniae NOT DETECTED NOT DETECTED Final   Streptococcus pyogenes NOT DETECTED NOT DETECTED Final   Acinetobacter baumannii NOT DETECTED NOT DETECTED Final   Enterobacteriaceae species NOT DETECTED NOT DETECTED Final   Enterobacter cloacae complex NOT DETECTED NOT DETECTED Final   Escherichia coli NOT DETECTED NOT DETECTED Final   Klebsiella oxytoca NOT DETECTED NOT DETECTED Final   Klebsiella pneumoniae NOT DETECTED NOT DETECTED Final   Proteus species NOT DETECTED NOT DETECTED Final   Serratia marcescens NOT DETECTED NOT DETECTED Final   Haemophilus influenzae NOT DETECTED NOT DETECTED Final   Neisseria meningitidis NOT DETECTED NOT DETECTED Final   Pseudomonas aeruginosa NOT DETECTED NOT DETECTED Final   Candida albicans NOT DETECTED NOT DETECTED Final   Candida glabrata NOT DETECTED NOT DETECTED Final   Candida krusei NOT DETECTED NOT DETECTED Final   Candida parapsilosis NOT DETECTED NOT DETECTED Final   Candida tropicalis NOT DETECTED NOT DETECTED Final    Comment: Performed at [facility], [address] [place], [place] [address]  MRSA PCR  Screening     Status: Abnormal   Collection Time: 12/28/18 12:51 PM   Specimen: Nasal Mucosa; Nasopharyngeal  Result Value Ref Range Status   MRSA by PCR POSITIVE (A) NEGATIVE Final    Comment:        The GeneXpert MRSA Assay (FDA approved for NASAL specimens only), is one component of a comprehensive MRSA colonization surveillance program. It is not intended to diagnose MRSA infection nor to guide or monitor treatment for MRSA infections. RESULT CALLED TO, READ BACK BY AND VERIFIED WITH: K ZULETA,RN 12/28/18 1507 RHOLMES Performed at [facility], [address] [place], [place] [address]   Respiratory Panel by PCR     Status: None   Collection Time: 12/28/18  1:59 PM   Specimen: Nasopharyngeal Swab; Respiratory  Result Value Ref Range Status   Adenovirus NOT DETECTED NOT DETECTED Final   Coronavirus 229E NOT DETECTED NOT DETECTED Final    Comment: (NOTE) The Coronavirus on the Respiratory Panel, DOES NOT test for the novel  Coronavirus (2019 nCoV)    Coronavirus HKU1 NOT DETECTED NOT DETECTED Final   Coronavirus NL63 NOT DETECTED NOT DETECTED Final   Coronavirus OC43 NOT DETECTED NOT DETECTED Final   Metapneumovirus NOT DETECTED NOT DETECTED Final   Rhinovirus / Enterovirus NOT DETECTED NOT DETECTED Final   Influenza A NOT DETECTED NOT DETECTED Final   Influenza B NOT DETECTED NOT DETECTED Final   Parainfluenza Virus 1 NOT DETECTED NOT DETECTED Final   Parainfluenza Virus 2 NOT DETECTED NOT DETECTED Final   Parainfluenza Virus 3 NOT DETECTED NOT DETECTED Final   Parainfluenza Virus 4 NOT DETECTED NOT DETECTED Final   Respiratory Syncytial Virus NOT DETECTED NOT DETECTED Final   Bordetella pertussis NOT DETECTED NOT DETECTED Final   Chlamydophila pneumoniae NOT DETECTED NOT DETECTED Final   Mycoplasma pneumoniae NOT DETECTED NOT DETECTED Final    Comment: Performed at [facility], [address], [place], [place] [address]  SARS Coronavirus 2  by RT PCR (hospital order, performed in [facility] hospital lab) Nasopharyngeal Nasopharyngeal Swab     Status: None   Collection Time: 12/28/18  1:59 PM   Specimen: Nasopharyngeal Swab  Result Value Ref Range Status   SARS Coronavirus 2 NEGATIVE NEGATIVE Final    Comment: (NOTE) If result is NEGATIVE SARS-CoV-2 target nucleic acids are NOT DETECTED. The SARS-CoV-2 RNA is generally detectable in upper and lower  respiratory specimens during the acute phase of infection. The lowest  concentration of SARS-CoV-2 viral copies this assay can detect is 250  copies / mL. A negative result does not preclude SARS-CoV-2 infection  and should not be used as the sole basis for treatment or other  patient management decisions.  A negative result may occur with  improper specimen collection / handling, submission of specimen other  than nasopharyngeal swab, presence of viral mutation(s) within the  areas targeted by this assay, and inadequate number of viral copies  (<250 copies / mL). A negative result must be combined with clinical  observations, patient history, and epidemiological information. If result is POSITIVE SARS-CoV-2 target nucleic acids are DETECTED. The SARS-CoV-2 RNA is generally detectable in upper and lower  respiratory specimens dur ing the acute phase of infection.  Positive  results are indicative of active infection with SARS-CoV-2.  Clinical  correlation with patient history and other diagnostic information is  necessary to determine patient infection status.  Positive results do  not rule out bacterial infection or co-infection with other viruses. If result is PRESUMPTIVE POSTIVE SARS-CoV-2 nucleic acids MAY BE PRESENT.   A presumptive positive result was obtained on the submitted specimen  and confirmed on repeat testing.  While 2019 novel coronavirus  (SARS-CoV-2) nucleic acids may be present in the submitted sample  additional confirmatory testing may be necessary for  epidemiological  and / or clinical management purposes  to differentiate between  SARS-CoV-2 and other Sarbecovirus currently known to infect humans.  If clinically indicated additional testing with an alternate test  methodology [phone]) is advised. The SARS-CoV-2 RNA is generally  detectable in upper and lower respiratory sp ecimens during the acute  phase of infection. The expected result is Negative. Fact Sheet for Patients:  BoilerBrush.com.cy Fact Sheet for Healthcare Providers: https://pope.com/ This test is not yet approved or cleared by the [place] FDA and has been authorized for detection and/or diagnosis of SARS-CoV-2 by FDA under an Emergency Use Authorization (EUA).  This EUA will remain in effect (meaning this test can be used) for the duration of the COVID-19 declaration under Section 564(b)(1) of the Act, 21 U.S.C. section 360bbb-3(b)(1), unless the authorization is terminated or revoked sooner. Performed at [facility], [address], [place], [place] [address]   Culture, blood (Routine X 2) w Reflex to ID Panel     Status: None   Collection Time: 12/30/18  8:33 AM   Specimen: BLOOD RIGHT HAND  Result Value Ref Range Status   Specimen Description   Final    BLOOD RIGHT HAND Performed at [facility], [address], [place], [place] [address]    Special Requests   Final    BOTTLES DRAWN AEROBIC ONLY Blood Culture adequate volume Performed at [facility], [address] [place], [place] [address]    Culture   Final    NO GROWTH 5 DAYS Performed at [facility], [address],  [place], [place] [address]    Report Status 01/04/2019 FINAL  Final  Culture, blood (routine x 2)     Status: None (Preliminary result)   Collection Time: 01/05/19  5:01 PM   Specimen: BLOOD  Result Value Ref Range Status   Specimen Description BLOOD RIGHT  ANTECUBITAL  Final   Special Requests   Final    BOTTLES DRAWN AEROBIC AND ANAEROBIC Blood Culture adequate volume   Culture   Final    NO GROWTH < 24 HOURS Performed at [facility], [address] [place], [place] [address]    Report Status PENDING  Incomplete    RN Pressure Injury Documentation:     Estimated body mass index is 16.24 kg/m as calculated from the following:   Height as of this encounter: 6' (1.829 m).   Weight as of this encounter: 54.3 kg.  Malnutrition Type:  Nutrition Problem: Increased nutrient needs Etiology: acute illness(sepsis)   Malnutrition Characteristics:  Signs/Symptoms: estimated needs   Nutrition Interventions:  Interventions: MVI, Prostat, Ensure Enlive (each supplement provides 350kcal and 20 grams of protein)   Radiology Studies: US Abdomen Limited Ruq  Result Date: 01/06/2019 CLINICAL DATA:  33 year old male with a history of abnormal LFTs EXAM: ULTRASOUND ABDOMEN LIMITED RIGHT UPPER QUADRANT COMPARISON:  12/28/2018 FINDINGS: Gallbladder: Gallbladder decompressed with no echogenic material. Negative sonographic Murphy's sign. Common bile duct: Diameter: 3 mm-4 mm Liver: No nodular contour of liver parenchyma. Relatively unremarkable echotexture of the liver. No focal lesion. Questionable geographic enlargement of the caudate. Portal vein is patent on color Doppler imaging with normal direction of blood flow towards the liver. Other: None. IMPRESSION: Relatively unremarkable sonographic appearance of the liver with questionable geographic enlargement of the caudate. Electronically Signed   By: Gilmer Mor D.O.   On: 01/06/2019 10:08     Scheduled Meds:  chlordiazePOXIDE  10 mg Oral BID   Chlorhexidine Gluconate Cloth  6 each Topical Daily   enoxaparin (LOVENOX) injection  40 mg Subcutaneous Q24H   feeding supplement (ENSURE ENLIVE)  237 mL Oral BID BM   feeding supplement (PRO-STAT SUGAR FREE 64)  30 mL Oral BID   folic  acid  1 mg Oral Daily   iron polysaccharides  150 mg Oral Daily   mouth rinse  15 mL Mouth Rinse BID   multivitamin with minerals  1 tablet Oral Daily   nicotine  21 mg Transdermal Daily   sodium chloride flush  10-40 mL Intracatheter Q12H   sulfamethoxazole-trimethoprim  1 tablet Oral Q12H   thiamine  100 mg Oral Daily   Or   thiamine  100 mg Intravenous Daily   Continuous Infusions:  sodium chloride 75 mL/hr at 01/06/19 1032    LOS: 9 days   Merlene Laughter, DO [facility] PAGER is on AMION  If 7PM-7AM, please contact night-coverage [email or web address] Password [email or web address] 01/06/2019, 2:51 PM

## 2019-01-06 NOTE — Progress Notes (Signed)
Nutrition Follow-up  RD working remotely.   DOCUMENTATION CODES:   Underweight  INTERVENTION:  - continue Ensure Enlive BID and prostat BID.  - continue to encourage PO intakes.    NUTRITION DIAGNOSIS:   Increased nutrient needs related to acute illness(sepsis) as evidenced by estimated needs. -ongoing  GOAL:   Patient will meet greater than or equal to 90% of their needs -minimally met on average  MONITOR:   PO intake, Supplement acceptance, Labs, Weight trends  ASSESSMENT:   33 y.o. male with medical history of alcohol abuse, polysubstance abuse/use (IV heroin, methamphetamines, and opiates), and asthma. He presented to the ED on 10/18 complaining of generalized body aches and fever x1 day. Patient presented with non-specific generalized body aches, including diffuse joint aches, with subjective fever. Patient denied any chest pain, cough, abdominal pain, nausea/vomiting, diarrhea, dizziness, or headaches. Patient is currently homeless; has been living in and out of hotels for the past couple of months. Patient was combative and very agitated/restless in the ED. Fever in the ED on 101.2 degrees F. UDS positive for amphetamines and opiates.  Patient has not been weighed since admission (10/18). Per review of orders, patient has accepted nearly all bottles of Ensure and nearly all packets of prostat. Patient has been eating variably, 25-100%, over the past 1 week.  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 Medications reviewed; 1 mg folvite/day, daily multivitamin with minerals, 100 mg thiamine/day.  IVF; NS @ 75 ml/hr.   Diet Order:   Diet Order            Diet regular Room service appropriate? Yes; Fluid  consistency: Thin  Diet effective now              EDUCATION NEEDS:   No education needs have been identified at this time  Skin:  Skin Assessment: Reviewed RN Assessment  Last BM:  10/26  Height:   Ht Readings from Last 1 Encounters:  12/28/18 6' (1.829 m)    Weight:   Wt Readings from Last 1 Encounters:  12/28/18 54.3 kg    Ideal Body Weight:  80.9 kg  BMI:  Body mass index is 16.24 kg/m.  Estimated Nutritional Needs:   Kcal:  1900-2175 kcal  Protein:  95-110 grams  Fluid:  >/= 2.1 L/day     Jarome Matin, MS, RD, LDN, [facility] Inpatient Clinical Dietitian Pager # [phone] After hours/weekend pager # [phone]

## 2019-01-06 NOTE — Progress Notes (Signed)
INFECTIOUS DISEASE PROGRESS NOTE  ID: Alex [name] is a 33 y.o. male with  Principal Problem:   Sepsis (HCC) Active Problems:   Tobacco use disorder   Polysubstance dependence (HCC)   Substance induced mood disorder (HCC)   IVDU (intravenous drug user)   Endocarditis of tricuspid valve  Subjective: No complaints.  Per nursing has been up all night.  Pt attributes his tachycardia to "drinking pure espresso" all night.   Abtx:  Anti-infectives (From admission, onward)   Start     Dose/Rate Route Frequency Ordered Stop   01/06/19 1000  sulfamethoxazole-trimethoprim (BACTRIM DS) 800-160 MG per tablet 1 tablet     1 tablet Oral Every 12 hours 01/05/19 1717 02/17/19 0959   01/05/19 1800  Oritavancin Diphosphate (ORBACTIV) 1,200 mg in dextrose 5 % IVPB     1,200 mg 333.3 mL/hr over 180 Minutes Intravenous Once 01/05/19 1714 01/05/19 2141   01/04/19 1000  vancomycin (VANCOCIN) 1,250 mg in sodium chloride 0.9 % 250 mL IVPB  Status:  Discontinued     1,250 mg 166.7 mL/hr over 90 Minutes Intravenous Every 12 hours 01/04/19 0859 01/05/19 1714   01/02/19 2000  vancomycin (VANCOCIN) 1,750 mg in sodium chloride 0.9 % 500 mL IVPB  Status:  Discontinued     1,750 mg 250 mL/hr over 120 Minutes Intravenous Every 12 hours 01/02/19 1227 01/04/19 0859   12/31/18 1000  vancomycin (VANCOCIN) 1,250 mg in sodium chloride 0.9 % 250 mL IVPB  Status:  Discontinued     1,250 mg 166.7 mL/hr over 90 Minutes Intravenous Every 12 hours 12/31/18 0929 01/02/19 1227   12/28/18 1800  vancomycin (VANCOCIN) IVPB 1000 mg/200 mL premix  Status:  Discontinued     1,000 mg 200 mL/hr over 60 Minutes Intravenous 2 times daily 12/28/18 0914 12/31/18 0929   12/28/18 1400  ceFEPIme (MAXIPIME) 2 g in sodium chloride 0.9 % 100 mL IVPB  Status:  Discontinued     2 g 200 mL/hr over 30 Minutes Intravenous Every 8 hours 12/28/18 0914 12/29/18 1542   12/28/18 0730  vancomycin (VANCOCIN) 1,250 mg in sodium chloride 0.9 % 250  mL IVPB     1,250 mg 166.7 mL/hr over 90 Minutes Intravenous STAT 12/28/18 0715 12/28/18 0935   12/28/18 0730  ceFEPIme (MAXIPIME) 2 g in sodium chloride 0.9 % 100 mL IVPB     2 g 200 mL/hr over 30 Minutes Intravenous STAT 12/28/18 0715 12/28/18 0833      Medications:  Scheduled: . chlordiazePOXIDE  10 mg Oral BID  . Chlorhexidine Gluconate Cloth  6 each Topical Daily  . enoxaparin (LOVENOX) injection  40 mg Subcutaneous Q24H  . feeding supplement (ENSURE ENLIVE)  237 mL Oral BID BM  . feeding supplement (PRO-STAT SUGAR FREE 64)  30 mL Oral BID  . folic acid  1 mg Oral Daily  . iron polysaccharides  150 mg Oral Daily  . mouth rinse  15 mL Mouth Rinse BID  . multivitamin with minerals  1 tablet Oral Daily  . nicotine  21 mg Transdermal Daily  . sodium chloride flush  10-40 mL Intracatheter Q12H  . sulfamethoxazole-trimethoprim  1 tablet Oral Q12H  . thiamine  100 mg Oral Daily   Or  . thiamine  100 mg Intravenous Daily    Objective: Vital signs in last 24 hours: Temp:  [97.6 F (36.4 C)-98.4 F (36.9 C)] 97.6 F (36.4 C) (10/27 0536) Pulse Rate:  [91-118] 117 (10/27 0536) Resp:  [16-20] 20 (  10/27 0536) BP: (118-134)/(70-89) 119/78 (10/27 0536) SpO2:  [99 %-100 %] 99 % (10/27 0536)   General appearance: alert, cooperative and no distress Resp: clear to auscultation bilaterally Cardio: regularly irregular rhythm GI: normal findings: bowel sounds normal and soft, non-tender  Lab Results Recent Labs    01/05/19 0412 01/06/19 0621  WBC 11.5* 11.6*  HGB 11.5* 11.3*  HCT 35.9* 35.3*  NA 139 138  K 3.8 3.9  CL 101 102  CO2 28 26  BUN 13 11  CREATININE 0.64 0.79   Liver Panel Recent Labs    01/05/19 0412 01/06/19 0621  PROT 7.8 8.0  ALBUMIN 2.9* 3.2*  AST 36 47*  ALT 59* 61*  ALKPHOS 180* 167*  BILITOT 0.4 0.6   Sedimentation Rate No results for input(s): ESRSEDRATE in the last 72 hours. C-Reactive Protein No results for input(s): CRP in the last 72  hours.  Microbiology: Recent Results (from the past 240 hour(s))  SARS CORONAVIRUS 2 (TAT 6-24 HRS) Nasopharyngeal Nasopharyngeal Swab     Status: None   Collection Time: 12/28/18  5:37 AM   Specimen: Nasopharyngeal Swab  Result Value Ref Range Status   SARS Coronavirus 2 NEGATIVE NEGATIVE Final    Comment: (NOTE) SARS-CoV-2 target nucleic acids are NOT DETECTED. The SARS-CoV-2 RNA is generally detectable in upper and lower respiratory specimens during the acute phase of infection. Negative results do not preclude SARS-CoV-2 infection, do not rule out co-infections with other pathogens, and should not be used as the sole basis for treatment or other patient management decisions. Negative results must be combined with clinical observations, patient history, and epidemiological information. The expected result is Negative. Fact Sheet for Patients: HairSlick.nohttps://www.fda.gov/media/138098/download Fact Sheet for Healthcare Providers: quierodirigir.comhttps://www.fda.gov/media/138095/download This test is not yet approved or cleared by the [place] FDA and  has been authorized for detection and/or diagnosis of SARS-CoV-2 by FDA under an Emergency Use Authorization (EUA). This EUA will remain  in effect (meaning this test can be used) for the duration of the COVID-19 declaration under Section 56 4(b)(1) of the Act, 21 U.S.C. section 360bbb-3(b)(1), unless the authorization is terminated or revoked sooner. Performed at [facility], [address] [place], [place] [address]   Blood culture (routine x 2)     Status: Abnormal   Collection Time: 12/28/18  7:47 AM   Specimen: BLOOD RIGHT ARM  Result Value Ref Range Status   Specimen Description   Final    BLOOD RIGHT ARM Performed at [facility], [address] [place], [place] [address]    Special Requests   Final    BOTTLES DRAWN AEROBIC AND ANAEROBIC Blood Culture results may not be optimal due to an excessive volume of blood received  in culture bottles Performed at [facility], [address] [place], [place] [address]    Culture  Setup Time   Final    GRAM POSITIVE COCCI IN BOTH AEROBIC AND ANAEROBIC BOTTLES CRITICAL RESULT CALLED TO, READ BACK BY AND VERIFIED WITH: PHARMD JUSTIN L 1948 [id number] FCP CRITICAL RESULT CALLED TO, READ BACK BY AND VERIFIED WITH: B GREEN,PHARMD AT 2206 12/28/2018 BY L BENFIELD Performed at [facility], [address] [place], [place] [address]    Culture METHICILLIN RESISTANT STAPHYLOCOCCUS AUREUS (A)  Final   Report Status 12/30/2018 FINAL  Final   Organism ID, Bacteria METHICILLIN RESISTANT STAPHYLOCOCCUS AUREUS  Final      Susceptibility   Methicillin resistant staphylococcus aureus -  MIC*    CIPROFLOXACIN >=8 RESISTANT Resistant     ERYTHROMYCIN >=8 RESISTANT Resistant     GENTAMICIN <=0.5 SENSITIVE Sensitive     OXACILLIN >=4 RESISTANT Resistant     TETRACYCLINE <=1 SENSITIVE Sensitive     VANCOMYCIN <=0.5 SENSITIVE Sensitive     TRIMETH/SULFA <=10 SENSITIVE Sensitive     CLINDAMYCIN <=0.25 SENSITIVE Sensitive     RIFAMPIN <=0.5 SENSITIVE Sensitive     Inducible Clindamycin NEGATIVE Sensitive     * METHICILLIN RESISTANT STAPHYLOCOCCUS AUREUS  Blood culture (routine x 2)     Status: Abnormal   Collection Time: 12/28/18  7:47 AM   Specimen: BLOOD  Result Value Ref Range Status   Specimen Description   Final    BLOOD RIGHT WRIST Performed at [facility], [address], [place], [place] [address]    Special Requests   Final    BOTTLES DRAWN AEROBIC AND ANAEROBIC Blood Culture results may not be optimal due to an inadequate volume of blood received in culture bottles Performed at [facility], [address] [place], [place] [address]    Culture  Setup Time   Final    GRAM POSITIVE COCCI IN CLUSTERS IN BOTH AEROBIC AND ANAEROBIC BOTTLES CRITICAL VALUE NOTED.  VALUE IS CONSISTENT WITH PREVIOUSLY REPORTED AND  CALLED VALUE.    Culture (A)  Final    STAPHYLOCOCCUS AUREUS SUSCEPTIBILITIES PERFORMED ON PREVIOUS CULTURE WITHIN THE LAST 5 DAYS. Performed at [facility], [address], [place], [place] [address]    Report Status 12/30/2018 FINAL  Final  Urine culture     Status: Abnormal   Collection Time: 12/28/18  7:47 AM   Specimen: In/Out Cath Urine  Result Value Ref Range Status   Specimen Description   Final    IN/OUT CATH URINE Performed at [facility], [address], [place], [place] [address]    Special Requests   Final    NONE Performed at [facility], [address], [place], [place] [address]    Culture (A)  Final    30,000 COLONIES/mL METHICILLIN RESISTANT STAPHYLOCOCCUS AUREUS   Report Status 12/30/2018 FINAL  Final   Organism ID, Bacteria METHICILLIN RESISTANT STAPHYLOCOCCUS AUREUS (A)  Final      Susceptibility   Methicillin resistant staphylococcus aureus - MIC*    CIPROFLOXACIN >=8 RESISTANT Resistant     GENTAMICIN <=0.5 SENSITIVE Sensitive     NITROFURANTOIN <=16 SENSITIVE Sensitive     OXACILLIN >=4 RESISTANT Resistant     TETRACYCLINE <=1 SENSITIVE Sensitive     VANCOMYCIN 1 SENSITIVE Sensitive     TRIMETH/SULFA <=10 SENSITIVE Sensitive     CLINDAMYCIN <=0.25 SENSITIVE Sensitive     RIFAMPIN <=0.5 SENSITIVE Sensitive     Inducible Clindamycin NEGATIVE Sensitive     * 30,000 COLONIES/mL METHICILLIN RESISTANT STAPHYLOCOCCUS AUREUS  Blood Culture ID Panel (Reflexed)     Status: Abnormal   Collection Time: 12/28/18  7:47 AM  Result Value Ref Range Status   Enterococcus species NOT DETECTED NOT DETECTED Final   Listeria monocytogenes NOT DETECTED NOT DETECTED Final   Staphylococcus species DETECTED (A) NOT DETECTED Final    Comment: CRITICAL RESULT CALLED TO, READ BACK BY AND VERIFIED WITH: B GREEN,PHARMD AT 2206 12/28/2018 BY L BENFIELD    Staphylococcus aureus (BCID) DETECTED (A) NOT DETECTED Final    Comment:  Methicillin (oxacillin)-resistant Staphylococcus aureus (MRSA). MRSA is predictably resistant to beta-lactam antibiotics (except ceftaroline). Preferred  therapy is vancomycin unless clinically contraindicated. Patient requires contact precautions if  hospitalized. CRITICAL RESULT CALLED TO, READ BACK BY AND VERIFIED WITH: B GREEN,PHARMD AT 2206 12/28/2018 BY L BENFIELD    Methicillin resistance DETECTED (A) NOT DETECTED Final    Comment: CRITICAL RESULT CALLED TO, READ BACK BY AND VERIFIED WITH: B GREEN,PHARMD AT 2206 12/28/2018 BY L BENFIELD    Streptococcus species NOT DETECTED NOT DETECTED Final   Streptococcus agalactiae NOT DETECTED NOT DETECTED Final   Streptococcus pneumoniae NOT DETECTED NOT DETECTED Final   Streptococcus pyogenes NOT DETECTED NOT DETECTED Final   Acinetobacter baumannii NOT DETECTED NOT DETECTED Final   Enterobacteriaceae species NOT DETECTED NOT DETECTED Final   Enterobacter cloacae complex NOT DETECTED NOT DETECTED Final   Escherichia coli NOT DETECTED NOT DETECTED Final   Klebsiella oxytoca NOT DETECTED NOT DETECTED Final   Klebsiella pneumoniae NOT DETECTED NOT DETECTED Final   Proteus species NOT DETECTED NOT DETECTED Final   Serratia marcescens NOT DETECTED NOT DETECTED Final   Haemophilus influenzae NOT DETECTED NOT DETECTED Final   Neisseria meningitidis NOT DETECTED NOT DETECTED Final   Pseudomonas aeruginosa NOT DETECTED NOT DETECTED Final   Candida albicans NOT DETECTED NOT DETECTED Final   Candida glabrata NOT DETECTED NOT DETECTED Final   Candida krusei NOT DETECTED NOT DETECTED Final   Candida parapsilosis NOT DETECTED NOT DETECTED Final   Candida tropicalis NOT DETECTED NOT DETECTED Final    Comment: Performed at [facility], [place], [place], [place] [address]  MRSA PCR Screening     Status: Abnormal   Collection Time: 12/28/18 12:51 PM   Specimen: Nasal Mucosa; Nasopharyngeal  Result Value Ref Range Status   MRSA by PCR  POSITIVE (A) NEGATIVE Final    Comment:        The GeneXpert MRSA Assay (FDA approved for NASAL specimens only), is one component of a comprehensive MRSA colonization surveillance program. It is not intended to diagnose MRSA infection nor to guide or monitor treatment for MRSA infections. RESULT CALLED TO, READ BACK BY AND VERIFIED WITH: K ZULETA,RN 12/28/18 1507 RHOLMES Performed at [facility], [place] [address], [place], [place] [address]   Respiratory Panel by PCR     Status: None   Collection Time: 12/28/18  1:59 PM   Specimen: Nasopharyngeal Swab; Respiratory  Result Value Ref Range Status   Adenovirus NOT DETECTED NOT DETECTED Final   Coronavirus 229E NOT DETECTED NOT DETECTED Final    Comment: (NOTE) The Coronavirus on the Respiratory Panel, DOES NOT test for the novel  Coronavirus (2019 nCoV)    Coronavirus HKU1 NOT DETECTED NOT DETECTED Final   Coronavirus NL63 NOT DETECTED NOT DETECTED Final   Coronavirus OC43 NOT DETECTED NOT DETECTED Final   Metapneumovirus NOT DETECTED NOT DETECTED Final   Rhinovirus / Enterovirus NOT DETECTED NOT DETECTED Final   Influenza A NOT DETECTED NOT DETECTED Final   Influenza B NOT DETECTED NOT DETECTED Final   Parainfluenza Virus 1 NOT DETECTED NOT DETECTED Final   Parainfluenza Virus 2 NOT DETECTED NOT DETECTED Final   Parainfluenza Virus 3 NOT DETECTED NOT DETECTED Final   Parainfluenza Virus 4 NOT DETECTED NOT DETECTED Final   Respiratory Syncytial Virus NOT DETECTED NOT DETECTED Final   Bordetella pertussis NOT DETECTED NOT DETECTED Final   Chlamydophila pneumoniae NOT DETECTED NOT DETECTED Final   Mycoplasma pneumoniae NOT DETECTED NOT DETECTED Final    Comment: Performed at [facility], [place]  [address], [place], [place] [address]  SARS Coronavirus 2 by RT PCR (hospital order, performed in [facility] hospital lab) Nasopharyngeal Nasopharyngeal Swab     Status: None   Collection Time: 12/28/18  1:59 PM    Specimen: Nasopharyngeal Swab  Result Value Ref Range Status   SARS Coronavirus 2 NEGATIVE NEGATIVE Final    Comment: (NOTE) If result is NEGATIVE SARS-CoV-2 target nucleic acids are NOT DETECTED. The SARS-CoV-2 RNA is generally detectable in upper and lower  respiratory specimens during the acute phase of infection. The lowest  concentration of SARS-CoV-2 viral copies this assay can detect is 250  copies / mL. A negative result does not preclude SARS-CoV-2 infection  and should not be used as the sole basis for treatment or other  patient management decisions.  A negative result may occur with  improper specimen collection / handling, submission of specimen other  than nasopharyngeal swab, presence of viral mutation(s) within the  areas targeted by this assay, and inadequate number of viral copies  (<250 copies / mL). A negative result must be combined with clinical  observations, patient history, and epidemiological information. If result is POSITIVE SARS-CoV-2 target nucleic acids are DETECTED. The SARS-CoV-2 RNA is generally detectable in upper and lower  respiratory specimens dur ing the acute phase of infection.  Positive  results are indicative of active infection with SARS-CoV-2.  Clinical  correlation with patient history and other diagnostic information is  necessary to determine patient infection status.  Positive results do  not rule out bacterial infection or co-infection with other viruses. If result is PRESUMPTIVE POSTIVE SARS-CoV-2 nucleic acids MAY BE PRESENT.   A presumptive positive result was obtained on the submitted specimen  and confirmed on repeat testing.  While 2019 novel coronavirus  (SARS-CoV-2) nucleic acids may be present in the submitted sample  additional confirmatory testing may be necessary for epidemiological  and / or clinical management purposes  to differentiate between  SARS-CoV-2 and other Sarbecovirus currently known to infect humans.  If  clinically indicated additional testing with an alternate test  methodology [phone]) is advised. The SARS-CoV-2 RNA is generally  detectable in upper and lower respiratory sp ecimens during the acute  phase of infection. The expected result is Negative. Fact Sheet for Patients:  BoilerBrush.com.cy Fact Sheet for Healthcare Providers: https://pope.com/ This test is not yet approved or cleared by the [place] FDA and has been authorized for detection and/or diagnosis of SARS-CoV-2 by FDA under an Emergency Use Authorization (EUA).  This EUA will remain in effect (meaning this test can be used) for the duration of the COVID-19 declaration under Section 564(b)(1) of the Act, 21 U.S.C. section 360bbb-3(b)(1), unless the authorization is terminated or revoked sooner. Performed at [facility], [address] [place], [place] [address]   Culture, blood (Routine X 2) w Reflex to ID Panel     Status: None   Collection Time: 12/30/18  8:33 AM   Specimen: BLOOD RIGHT HAND  Result Value Ref Range Status   Specimen Description   Final    BLOOD RIGHT HAND Performed at [facility], [address], [place], [place] [address]    Special Requests   Final    BOTTLES DRAWN AEROBIC ONLY Blood Culture adequate volume Performed at [facility], [address], [place], [place] [address]    Culture   Final    NO GROWTH 5 DAYS Performed at [facility], [address],  [place], [place] [address]    Report Status 01/04/2019 FINAL  Final  Culture, blood (routine x 2)     Status: None (Preliminary result)   Collection Time: 01/05/19  5:01 PM   Specimen: BLOOD  Result Value Ref Range Status   Specimen Description BLOOD RIGHT ANTECUBITAL  Final   Special Requests   Final    BOTTLES DRAWN AEROBIC AND ANAEROBIC Blood Culture adequate volume   Culture   Final    NO GROWTH < 24 HOURS  Performed at [facility], [address] [place], [place] [address]    Report Status PENDING  Incomplete    Studies/Results: No results found.   Assessment/Plan: Staph aureus bacteremia, MRSA TV IE IVDA ETOH abuse  Total days of antibiotics:8vanco --> oritavancin  He can be d/c on bactrim for 6 weeks.  Repeat BCx 10-20 (-). Repeat BCx sent 10-26, pending Noted that he again been questioned about use of illicit substances.  Pull midline as soon as feasible. Available as needed.           Johny Sax MD, FACP Infectious Diseases (pager) [phone] [email or web address] 01/06/2019, 9:15 AM  LOS: 9 days

## 2019-01-06 NOTE — TOC Progression Note (Signed)
Transition of Care [facility]) - Progression Note    Patient Details  Name: Alex [name] MRN: [id number] Date of Birth: 09-18-1985  Transition of [facility]) CM/SW Contact  Purcell Mouton, RN Phone Number: 01/06/2019, 2:30 PM  Clinical Narrative:    Spoke with Pt's father Mr. Augustus concerning pt's medications. Mr. Maring states that he will purchase pt's medications at discharge.         Expected Discharge Plan and Services                                                 Social Determinants of Health (SDOH) Interventions    Readmission Risk Interventions No flowsheet data found.

## 2019-01-06 NOTE — Discharge Summary (Signed)
Physician Discharge Summary  Alex [name] ZOX:[id number] DOB: Oct 31, 1985 DOA: 12/28/2018  PCP: Patient, No Pcp Per  Admit date: 12/28/2018 Discharge date: 01/06/2019  Admitted From: Home Disposition: Signed out AMA  Recommendations for Outpatient Follow-up:  1. Follow up with PCP in 1-2 weeks and Infectious Diseases 2. Take Bactrim for 6 weeks and Follow up with Infectious Diseases  3. Please obtain CMP/CBC, Mag, Phos in one week  Home Health: No  Equipment/Devices: None  Discharge Condition: Guarded CODE STATUS: FULL CODE  Diet recommendation: Heart Healthy Carb Modified Diet  Brief/Interim Summary: Patient is a 33 year old thin Caucasian male with a past medical history significant for but not limited to alcohol abuse, polysubstance abuse, asthma and other comorbidities who presented to the hospital with chief complaint of generalized body aches and chills.  Uses IV heroin and methamphetamines and last meth use was about a week ago and heroin use was prior to ED arrival.  In the ED he is noted to be febrile on septic without unknown etiology and UDS was positive for amphetamines and opiates.  Blood cultures were positive for MRSA and started on vancomycin.  Further work-up reveals that he has a tricuspid endocarditis with severe tricuspid insufficiency.  Blood cultures have been repeated on 10/20/202 and show NGTD at 3 Days.  We will have cardiothoracic surgery evaluate for further evaluation and recommendations and Dr. Laneta Simmers does not feel there is indication for surgery at this time.  Patient will need long-term antibiotics and ID is recommending at least 14 days of IV vancomycin and considering changing to p.o. patient still complaining of some back pain and is open to the idea of maybe trying Suboxone eventually.  Nursing reported that he has a friend that comes every evening and bring some stuff and patient locked himself in the bathroom however no contraband has been found.   Checked a UDS just in case and was Positive for Benzo's and Opiates.  The friend turned out to be his sister and patient's father suspect the patient's sister is bringing him medications to inject or other contraband.  Nurse reported that the midline IV was leaking and that the port closest to the site appeared to be leaking air bubbles.  It is likely the patient is injected into his port and a repeat UDS showed +Amphetamines, +Benzos, +Opites. The patient was placed with a telemetry sitter given the suspicion that he is using his IV to inject himself with substances.  Today an empty bag was found on the patient's sock and I asked the patient about his recording he states that he took Adderall and cut it up into 3 pieces however there are still some suspicion that he was injecting into his port.  ID has changed the patient to p.o. antibiotics with Bactrim and he was given Oritavacin yesterday.  Will need to remove midline and will taper off his Librium (he was getting it TID scheduled) have cut down to twice daily today for at least 2 days and then will go 10 daily.  Of note patient also had some abnormal LFTs so we will check a right upper quadrant ultrasound and acute hepatitis panel and his WBC went up again as well as platelet count.  **ADDENDUM 17:15: I spoke with the patient at length today and he came clean and admitted that he had been lying entire time that he has been here.  Patient states that when he first came to the hospital he had a stash of drugs with  him including methamphetamines and "dope" and that he was using the entire time that he was in the hospital and got scared when we put the telemetry sitter.  He states that he knew it was counterintuitive having an endocarditis in him shooting up but he states that he did not want to get sick and he injected methamphetamines into his vein 3 days ago using a tourniquet that he got from the hospital and an alcohol pad and states most of the supplies  he got were from the hospital.  He states that he put the needle in the sharps box and then also had a stash of fentanyl with him which she was snorting and he snorted it last at 2:30 AM this morning.    His main concern is that he did not want to get "dope sick" and stated that he knew that Adderall would not cause a heart rate to go up the significantly.  He states that he got scared and had a change of heart and was going to leave AGAINST MEDICAL ADVICE to go to [facility] to try and get detoxed but called them and states they won't take him.  States that he is a heavy alcoholic and he drank 3 days prior to coming in.  His biggest concern was that he wants to get better and get treated and go to detox again eventually eventually.  I told him we are not a detox center and that only a limited amount of [place] can prescribe the long-acting opioid Suboxone or Subutex at discharge and have to have a DEA-X license I discussed with him about the IM teaching service Suboxone clinic and he was interested in that intially.  He states that the Librium really does not do much from and that he is out of his alcohol withdrawals now and does not really get shakes but states that he is afraid of getting "dope sick".    He states that he does not want to have any more Hydrocodone and wants to get better.  He is very forthcoming about what he had done in the hospital and states that he would snort his fentanyl when he would go to the bathroom and realized that because on when his heart rate started going up.  He states that he does not like Clonidine I discussed with him that he would like to detox and he would likely go to [facility] after he leaves here.   Will not prescribe Subutex or Suboxone (patient states that he is "allergic to buprenorphine with naloxone and it") and in the interim will try to Provide some relief for his withdrawals (Opiate) and will be monitored opiates with as needed oxycodone 5 mg  q. 4 as needed as well as subcu Dilaudid 0.5 mg p.o. 3h if as needed Oxycodone is not effective in the short-term as he is likely to be discharged in next 24 to 48 hours.    We will provide a social work consult to provide patient resources, have a Recruitment consultant at bedside and will need to discuss with the teaching team about starting Subutex and getting him into their clinic.  Will need to reach out to the IM teaching service in the a.m. to see if the patient can be started on Subutex and get into their clinic for monitored withdrawal.  I told him that I wanted to monitor him for medical stability however patient may however still leave AGAINST MEDICAL ADVICE and go to  [place] [facility] to try and get detoxed despite our efforts as he is afraid of going into "dope withdrawals".   ADDENDUM 1900: Discussed with the patient that he wanted to leave AGAINST MEDICAL ADVICE I advised that it was better for him to stay in the hospital to monitor him but he did not want to wait to even see if the IM teaching service could evaluate him for possible Subutex in their clinic.  He states that he did not want to go through dope withdrawal and did not want to wait and had a stash of buprenorphine that he is going to go home and take.  I told him that this was AGAINST MEDICAL ADVICE and he understands the risks of this being of sound mind and he understands risks of decompensation, worsening and even death.  Patient subsequently signed out AGAINST MEDICAL ADVICE and will be going home with his dad but prior to signout I sent the prescription for his Bactrim to his pharmacy  Discharge Diagnoses:  Principal Problem:   Sepsis (HCC) Active Problems:   Tobacco use disorder   Polysubstance dependence (HCC)   Substance induced mood disorder (HCC)   IVDU (intravenous drug user)   Endocarditis of tricuspid valve  Sepsis without shock, likely from IV Drug Use MRSA bacteremia from Tricuspid Endocarditis  AMS in the setting  of withdrawal and Bacteremia, Improved  -Mentation is greatly improved but was severely agitated and per nursing kept getting out of the bed and yelling at the Nursing staff but was calm today  -Ordered Echocardiogram to rule out infective endocarditis and was normal but TEE showed Tricuspid Valve Endocarditis and Severe Tricuspid Insuffiencey  -CRP was 19.6 and LA was 1.3 -Continued IV Vancomycin-Day 8; Monitor Renal Function closely and Vancomycin peak was 15 and trough was 8 with a creatinine of 0.79 today; IV Vancomcyin now stopped and given a dose of Oritavancin Diphosphate 1200 mg x1  -Repeat surveillance cultures show NGTD at 5 Days; Will again repeat given suspected IVDU through his Midline and Blood Cx x2 on 10/ -Leukocytosis has worsened now and went from 8.3 is now 11.6 and likely in the setting of suspected intravenous drug use vs Oral Adderal Use? -Discontinued Foley -IV fluid resumed and will give a 1 Liter bolus and start on IV Maintenance at 75 mL/hr -ID following and Appreciate further evaluation and recommendations recommending cardiothoracic surgery evaluation I have reached out to the [facility] for formal evaluation given his tricuspid endocarditis along with severe tricuspid valve insufficiency and Dr. Laneta SimmersBartle does not feel there is any indication for surgery at this point -Infectious diseases recommended to continue IV antibiotics with vancomycin and changing to p.o. antibiotics after 14 days possibly if he is compliant initially but after suspicion of Using Midline as direct access to Inject, ID gave the patient 1x of Oritavancin and have changed the patient to po Bactrim for 6 weeks  -Continue to Monitor for S/Sx of Infection  -It is highly likely the patient has used his midline after direct access to inject substances into his vein.  Patient's sister is no longer allowed to visit and will place a telemetry sitter and repeat blood cultures and a UDS -We will monitor the patient  closely and if necessary will need security search as well -Because his HR elevated again and WBC is worsening will continue to Monitor closely and anticipate D/C home in the Next few days when HR is improving and Weaned off of Librium   Hyponatremia,  improved -Resolved with IVF Hydration and is now 138 -IVF hydration has been resumed  -Continue to Monitor and Trend -Repeat CMP in AM   Transaminitis/Abnormal LFTs, worsened now -AST was 57 and ALT was 57 and now improved as AST was 22 and ALT was 35 -Checked RUQ U/S and was read as a normal study; Will repeat RUQ now given worsening and obtain an Acute Hepatitis Panel  --n the setting of and likely doing illicit substances -Continue to monitor and trend LFTs -Started on IV fluid hydration with normal saline -Repeat CMP in a.m.  Polysubstance Abuse with concerns of Active Withdrawal, improving  Tobacco Use -C/w Withdrawal Protocol with IV Lorazepam 1-4 mg q1hprn and C/w IV Lorazepam 2 mg q4hprn Agitation -Continue Chlordiazepoxide 10 mg po TID to help with his withdrawal and have started  tapering and went to 10 mg po BID for 2 Days and will need to go 10 mg for 2 Days and stopped  -Considered starting Suboxone but will need a washout and given inability to trust patient will hold off;  ADDENDUM: Came clean about injecting methamphetamines and snorting fentanyl while he was in the hospital at higher time.  Was interested in getting detox with Subutex however I discussed with him that many of [place] cannot write Subutex at discharge and I wanted him to have an evaluation by the internal medicine teaching service to see if they could get him into his clinic.  Patient subsequently was interested but then refused as he is afraid of going to metoprolol and states that he would rather go home and use the Buprenoprhine that he has there to get him out of the dope withdrawal go home with his dad so he can be monitored closely; patient also told me prior  to leaving that he is "allergic to buprenorphine with naloxone" -C/w Nicotine 21 mg TD Patch -C/w Folic Acid 1 mg po Daily, MVI+Minerals, and Thiamine 100 mg po Daily  -C/w Haloperidol Lactate 5 mg IV q6hprn for Agitation -Is open to the idea of possibly starting Suboxone but I will not currently start it at this point given his admission of taking Adderall and ? Injection into his port  -IVF resumed today  -Check UDS as Nursing reporting friendbringing stuff for the patient nightly; turned out to be a sister and patient's father believes that the patient may have gotten stuff from his sister to inject into his midline; Repeat UDS +Amphetamines,+Benzo's,+Opiates now and he states that he took Adderall -As above; sister no longer allowed to visit and will continue a telemetry sitter given inability to trust patient as he had a empty baggie in his sock   History of Asthma -C/w Albuterol 2.5 mg Neg q2hprn Wheezing and SOB  Penile Lesion -STI work-up being done and RPR was Non-Reactive -HSV1 Glycoprotein was 55.40, HSV2 Glycoprotein was 1.67, HSV-2 IgG Supplemental Test was Postive and HSVI/II Comb IgM was 1.13 -Hepatitis was negative and GC/Chlaymidia is still pending currently and has not resulted yet -Infectious diseases was following appreciate their evaluation and recommendations  Underweight -Nutritionist consulted for further evaluation and recommendations -Patient is ordering Ensure Enlive twice daily low 30 mL with Prostat twice daily and continuing to encourage p.o. intakes  Normocytic Anemia -Patient's Hb/HCt is now 11.3/35.3 -Checked Anemia Panel and showed iron level of 16, U IBC of 168, TIBC of 184, saturation ratios of 9%, ferritin level 208, folate level of 8.9, vitamin B12 level 436 -Started the patient on iron polysaccharides 150 mg  p.o. daily -Continue to Monitor for S/Sx of Bleeding -Repeat CBC in AM    Tricuspid Insuffiencey  -Seen on TEE and was noted to be  Severe -Dr. Sallyanne Kuster recommended treating Endocarditis and re-evaluating with Transthoracic ECHO after completion of IV Abx -We will have Cardiothoracic surgery evaluate and currently there is no indication for surgery at this point -Continued IV Abx and Treatment Course for Endocarditis but this is going to be changed to p.o. Bactrim for 6 weeks total as well as him getting a one-time dose of Oritavancin  Hyperglycemia in the setting of Pre-Diabetes -Initially thought to be Reactive in the setting of infection -Checked HbA1c and was 6.1 -Blood Sugars have been ranging from 89-146 on Daily BMP/CMP; Today was 114 -Continue to Monitor Blood Sugars carefully and if necessary will place on Sensitive Novolog SSI AC  Hypokalemia, improved -Patient's K+ this AM was 3.8 -Mag Level was 2.0 -Continue to Monitor and Replete as Necessary -Repeat CMP in AM   Sinus Tachycardia -Patient is attributing it to drinking several coffee with espresso shots however it is Likely from him injecting himself while he is in the hospital through his midline; he now tells me that he took Adderall yesterday and cut it open to 3 pill pieces Addendum as above; he states he injected methamphetamines 3 days ago and was snorting methamphetamines and snorted last of it 2 days ago and snorted fentanyl early this morning -Heart rate remains elevated -Cannot confirm but it is highly suspicious for injection into his port -Given a liter of normal saline bolus and started back on maintenance IV fluids at 75 mL's per hour for 1 day -We will need to continue to monitor closely and will place a telemetry sitter -Continue with telemetry monitoring  Thrombocytosis -Worsening and likely reactive in taking Adderall and do not know if he has injected it -Platelet count went from 395 is now 585 -Continue to monitor and trend repeat CBC in a.m.  Hyperphosphatemia -Patient's Phos Level was 5.0 -Continue to Monitor and Replete as  Necessary -Repeat Phos Level in AM   Discharge Instructions  Allergies as of 01/06/2019   No Known Allergies     Medication List    STOP taking these medications   beclomethasone 80 MCG/ACT inhaler Commonly known as: Qvar RediHaler   ipratropium-albuterol 0.5-2.5 (3) MG/3ML Soln Commonly known as: DUONEB   montelukast 10 MG tablet Commonly known as: SINGULAIR   predniSONE 10 MG (21) Tbpk tablet Commonly known as: STERAPRED UNI-PAK 21 TAB   traZODone 50 MG tablet Commonly known as: DESYREL     TAKE these medications   albuterol 108 (90 Base) MCG/ACT inhaler Commonly known as: Proventil HFA Inhale 1-2 puffs into the lungs every 4 (four) hours as needed for wheezing or shortness of breath.   albuterol (2.5 MG/3ML) 0.083% nebulizer solution Commonly known as: PROVENTIL Take 3 mLs (2.5 mg total) by nebulization every 6 (six) hours as needed for wheezing or shortness of breath.   hydroxypropyl methylcellulose / hypromellose 2.5 % ophthalmic solution Commonly known as: ISOPTO TEARS / GONIOVISC Place 1 drop into both eyes 3 (three) times daily as needed for dry eyes.   naproxen sodium 220 MG tablet Commonly known as: ALEVE Take 440 mg by mouth 2 (two) times daily as needed (pain).   sulfamethoxazole-trimethoprim 800-160 MG tablet Commonly known as: BACTRIM DS Take 1 tablet by mouth every 12 (twelve) hours.       No Known Allergies  Consultations:  Cardiology for  TEE  Infectious Diseases   PCCM/Pulmonary   Cardiothoraic Surgery notified   Procedures/Studies: Dg Chest 2 View  Result Date: 12/28/2018 CLINICAL DATA:  Generalized body aches.  Drug abuse. EXAM: CHEST - 2 VIEW COMPARISON:  11/21/2018; 04/26/2014 FINDINGS: Grossly unchanged cardiac silhouette and mediastinal contours. The lungs appear hyperexpanded with flattening of the diaphragms mild diffuse slightly nodular thickening of the pulmonary interstitium. No discrete focal airspace opacities. No  pleural effusion or pneumothorax. No evidence of edema. No acute osseous abnormalities. IMPRESSION: Similar findings of lung hyperexpansion and bronchitic change without superimposed acute cardiopulmonary disease. Electronically Signed   By: Simonne Come M.D.   On: 12/28/2018 06:11   US Abdomen Limited Ruq  Result Date: 01/06/2019 CLINICAL DATA:  33 year old male with a history of abnormal LFTs EXAM: ULTRASOUND ABDOMEN LIMITED RIGHT UPPER QUADRANT COMPARISON:  12/28/2018 FINDINGS: Gallbladder: Gallbladder decompressed with no echogenic material. Negative sonographic Murphy's sign. Common bile duct: Diameter: 3 mm-4 mm Liver: No nodular contour of liver parenchyma. Relatively unremarkable echotexture of the liver. No focal lesion. Questionable geographic enlargement of the caudate. Portal vein is patent on color Doppler imaging with normal direction of blood flow towards the liver. Other: None. IMPRESSION: Relatively unremarkable sonographic appearance of the liver with questionable geographic enlargement of the caudate. Electronically Signed   By: Gilmer Mor D.O.   On: 01/06/2019 10:08   US Abdomen Limited Ruq  Result Date: 12/28/2018 CLINICAL DATA:  Elevated liver enzymes. EXAM: ULTRASOUND ABDOMEN LIMITED RIGHT UPPER QUADRANT COMPARISON:  None. FINDINGS: Gallbladder: No gallstones or wall thickening visualized. No sonographic Murphy sign noted by sonographer. Common bile duct: Diameter: 4.2 mm Liver: No focal lesion identified. Within normal limits in parenchymal echogenicity. Portal vein is patent on color Doppler imaging with normal direction of blood flow towards the liver. Other: None. IMPRESSION: Normal study. Electronically Signed   By: Gerome Sam III M.D   On: 12/28/2018 17:09    ECHOCARDIOGRAM 12/30/2018 IMPRESSIONS   1. Left ventricular ejection fraction, by visual estimation, is 60 to 65%. The left ventricle has normal function. Normal left ventricular size. There is no left  ventricular hypertrophy. 2. Global right ventricle has normal systolic function.The right ventricular size is normal. No increase in right ventricular wall thickness. 3. Left atrial size was normal. 4. Right atrial size was normal. 5. The mitral valve is normal in structure. No evidence of mitral valve regurgitation. No evidence of mitral stenosis. 6. Thickened TV leaflet with possible mobile vegetation. ricuspid valve regurgitation is mild. 7. AV is poorly visualized but concerning in the parasternal long axis view for possible vegetation on the ventricular side of AV. Aortic valve regurgitation was not visualized by color flow Doppler. 8. The pulmonic valve was normal in structure. Pulmonic valve regurgitation is not visualized by color flow Doppler. 9. Normal pulmonary artery systolic pressure. 10. The inferior vena cava is normal in size with greater than 50% respiratory variability, suggesting right atrial pressure of 3 mmHg. 11. Recommend TEE for further evaluation for possible vegetation on AV and TV.  FINDINGS Left Ventricle: Left ventricular ejection fraction, by visual estimation, is 60 to 65%. The left ventricle has normal function. There is no left ventricular hypertrophy. Normal left ventricular size.  Right Ventricle: The right ventricular size is normal. No increase in right ventricular wall thickness. Global RV systolic function is has normal systolic function. The tricuspid regurgitant velocity is 2.13 m/s, and with an assumed right atrial pressure of 3 mmHg, the estimated right ventricular systolic pressure  is normal at 21.1 mmHg.  Left Atrium: Left atrial size was normal in size.  Right Atrium: Right atrial size was normal in size  Pericardium: There is no evidence of pericardial effusion.  Mitral Valve: The mitral valve is normal in structure. No evidence of mitral valve stenosis by observation. No evidence of mitral valve regurgitation.  Tricuspid  Valve:Tricuspid valve regurgitation is mild by color flow Doppler. Thickened TV leaflet with possible mobile vegetation.   Aortic Valve: The aortic valve was not well visualized. Aortic valve regurgitation was not visualized by color flow Doppler. The aortic valve is structurally normal, with no evidence of sclerosis or stenosis. AV is poorly visualized but concerning in the parasternal long axis view for possible vegetation on the ventricular side of AV.  Pulmonic Valve: The pulmonic valve was normal in structure. Pulmonic valve regurgitation is not visualized by color flow Doppler.  Aorta: The aortic root, ascending aorta and aortic arch are all structurally normal, with no evidence of dilitation or obstruction.  Venous: The inferior vena cava is normal in size with greater than 50% respiratory variability, suggesting right atrial pressure of 3 mmHg.  IAS/Shunts: No atrial level shunt detected by color flow Doppler. No ventricular septal defect is seen or detected. There is no evidence of an atrial septal defect.    LEFT VENTRICLE PLAX 2D LVIDd: 4.20 cm Diastology LVIDs: 2.90 cm LV e' lateral: 12.40 cm/s LV PW: 1.20 cm LV E/e' lateral: 4.2 LV IVS: 1.10 cm LV e' medial: 16.20 cm/s LVOT diam: 2.20 cm LV E/e' medial: 3.2 LV SV: 46 ml LV SV Index: 28.28 LVOT Area: 3.80 cm   RIGHT VENTRICLE RV S prime: 14.10 cm/s TAPSE (M-mode): 2.6 cm  LEFT ATRIUM Index LA diam: 2.80 cm 1.63 cm/m LA Vol (A2C): 23.8 ml 13.89 ml/m LA Vol (A4C): 34.9 ml 20.37 ml/m LA Biplane Vol: 31.1 ml 18.16 ml/m AORTIC VALVE LVOT Vmax: 84.90 cm/s LVOT Vmean: 52.200 cm/s LVOT VTI: 0.126 m  AORTA Ao Asc diam: 2.90 cm  MITRAL VALVE TRICUSPID VALVE MV Area (PHT): 4.63 cm TR Peak grad: 18.1 mmHg MV PHT: 47.56 msec TR Vmax: 216.00  cm/s MV Decel Time: 164 msec MV E velocity: 52.30 cm/s 103 cm/s SHUNTS MV A velocity: 42.00 cm/s 70.3 cm/s Systemic VTI: 0.13 m MV E/A ratio: 1.25 1.5 Systemic Diam: 2.20 cm  TEE 12/31/2018 IMPRESSIONS   1. Left ventricular ejection fraction, by visual estimation, is 60 to 65%. The left ventricle has normal function. Normal left ventricular size. There is no left ventricular hypertrophy. 2. Global right ventricle has normal systolic function.The right ventricular size is normal. No increase in right ventricular wall thickness. 3. Left atrial size was normal. 4. Right atrial size was normal. 5. The mitral valve is normal in structure. Trace mitral valve regurgitation. No evidence of mitral stenosis. 6. Small vegetation on the tricuspid valve. There is a probable perforation of the anterior leaflet, adjacent to the vegetation. 7. The tricuspid valve is normal in structure. Tricuspid valve regurgitation severe. 8. The aortic valve is normal in structure. Aortic valve regurgitation was not visualized by color flow Doppler. Structurally normal aortic valve, with no evidence of sclerosis or stenosis. 9. The pulmonic valve was normal in structure. Pulmonic valve regurgitation is not visualized by color flow Doppler. 10. Mildly elevated pulmonary artery systolic pressure.  FINDINGS Left Ventricle: Left ventricular ejection fraction, by visual estimation, is 60 to 65%. The left ventricle has normal function. No evidence of left ventricular regional wall motion  abnormalities. There is no left ventricular hypertrophy. Normal left  ventricular size.  Right Ventricle: The right ventricular size is normal. No increase in right ventricular wall thickness. Global RV systolic function is has normal systolic function. The tricuspid regurgitant velocity is 2.65 m/s, and with an assumed right atrial pressure of 8 mmHg, the estimated right ventricular systolic pressure is mildly  elevated at 36.1 mmHg.  Left Atrium: Left atrial size was normal in size.  Right Atrium: Right atrial size was normal in size  Pericardium: There is no evidence of pericardial effusion.  Mitral Valve: The mitral valve is normal in structure. No evidence of mitral valve stenosis by observation. Trace mitral valve regurgitation. There is no evidence of mitral valve vegetation.  Tricuspid Valve: The tricuspid valve is normal in structure. Tricuspid valve regurgitation severe by color flow Doppler. There is a small non-mobile tricuspid valve vegetation on the anterior leaflet. The TV vegetation measures 5 mm x 7 mm. There appears to be a 3 mm diameter perforation in the anterior leaflet.  Aortic Valve: The aortic valve is normal in structure. Aortic valve regurgitation was not visualized by color flow Doppler. The aortic valve is structurally normal, with no evidence of sclerosis or stenosis.  Pulmonic Valve: The pulmonic valve was normal in structure. Pulmonic valve regurgitation is not visualized by color flow Doppler.  Aorta: The aortic root, ascending aorta and aortic arch are all structurally normal, with no evidence of dilitation or obstruction.  Shunts: No ventricular septal defect is seen or detected. There is no evidence of an atrial septal defect. No atrial level shunt detected by color flow Doppler.   TRICUSPID VALVE Normals TR Peak grad: 28.1 mmHg TR Vmax: 265.00 cm/s 288 cm/s   Subjective from this AM: Seen and examined at bedside and he was pacing around his room.  States that his back pain is improved.  No nausea or vomiting.  I asked him about why his urine drug screen was positive for amphetamines and he told me that he took Adderall and that he had cut it up into 3 pieces and was taking the pills.  Also states that he has been drinking a lot of espresso.  Cannot really tell me what happened with his port.  No chest pain, lightheadedness or  dizziness.  I spoke to him about his abnormal LFTs and work-up and why he was started back on fluids.  We will need to continue to monitor patient extremely carefully and will continue telemetry sitter for now.  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  He is of sound mind when he made this decision  Discharge Exam: Vitals:   01/06/19 0536 01/06/19 1248  BP: 119/78 111/76  Pulse: (!) 117 (!) 110  Resp: 20   Temp: 97.6 F (36.4 C) 97.9 F (36.6 C)  SpO2: 99% 100%   Vitals:   01/06/19 0316 01/06/19 0536 01/06/19 1248 01/06/19 1500  BP: 118/83 119/78 111/76   Pulse: (!) 118 (!) 117 (!) 110   Resp: 18 20    Temp: 98 F (36.7 C) 97.6 F (36.4 C) 97.9 F (36.6 C)   TempSrc: Oral Oral Oral   SpO2:  99% 100%   Weight:    54.3 kg  Height:       Examination this  AM Physical Exam:  Constitutional: Thin Underweight Caucasian male in NAD and appears calm and anxious again  Eyes: Lids and conjunctivae normal, sclerae anicteric  ENMT: External Ears, Nose appear normal. Grossly normal hearing. Mucous membranes are moist.  Neck: Appears normal, supple, no cervical masses, normal ROM, no appreciable thyromegaly; no JVD Respiratory: Diminished to auscultation bilaterally, no wheezing, rales, rhonchi or crackles. Normal respiratory effort and patient is not tachypenic. No accessory muscle use. Unlabored breathing  Cardiovascular: Tachycardic Rate but Regular Rhythm, no murmurs / rubs / gallops. S1 and S2 auscultated. No extremity edema. Abdomen: Soft, non-tender, non-distended. Bowel sounds positive x4.  GU: Deferred. Musculoskeletal: No clubbing / cyanosis of digits/nails. No joint deformity upper and lower extremities.  Skin: No rashes, lesions, ulcers but has tract marks on arms from injecting and a Tattoo on his  Right arm. No induration; Warm and dry.  Neurologic: CN 2-12 grossly intact with no focal deficits.  Romberg sign and cerebellar reflexes not assessed.  Psychiatric: Normal judgment and insight. Alert and oriented x 3. Anxious mood and appropriate affect.   The results of significant diagnostics from this hospitalization (including imaging, microbiology, ancillary and laboratory) are listed below for reference.     Microbiology: Recent Results (from the past 240 hour(s))  SARS CORONAVIRUS 2 (TAT 6-24 HRS) Nasopharyngeal Nasopharyngeal Swab     Status: None   Collection Time: 12/28/18  5:37 AM   Specimen: Nasopharyngeal Swab  Result Value Ref Range Status   SARS Coronavirus 2 NEGATIVE NEGATIVE Final    Comment: (NOTE) SARS-CoV-2 target nucleic acids are NOT DETECTED. The SARS-CoV-2 RNA is generally detectable in upper and lower respiratory specimens during the acute phase of infection. Negative results do not preclude SARS-CoV-2 infection, do not rule out co-infections with other pathogens, and should not be used as the sole basis for treatment or other patient management decisions. Negative results must be combined with clinical observations, patient history, and epidemiological information. The expected result is Negative. Fact Sheet for Patients: HairSlick.no Fact Sheet for Healthcare Providers: quierodirigir.com This test is not yet approved or cleared by the [place] FDA and  has been authorized for detection and/or diagnosis of SARS-CoV-2 by FDA under an Emergency Use Authorization (EUA). This EUA will remain  in effect (meaning this test can be used) for the duration of the COVID-19 declaration under Section 56 4(b)(1) of the Act, 21 U.S.C. section 360bbb-3(b)(1), unless the authorization is terminated or revoked sooner. Performed at [facility], [address], [place], [place] [address]   Blood culture  (routine x 2)     Status: Abnormal   Collection Time: 12/28/18  7:47 AM   Specimen: BLOOD RIGHT ARM  Result Value Ref Range Status   Specimen Description   Final    BLOOD RIGHT ARM Performed at [facility], [address] [place], [place] [address]    Special Requests   Final    BOTTLES DRAWN AEROBIC AND ANAEROBIC Blood Culture results may not be optimal due to an excessive volume of blood received in culture bottles Performed at [facility] [address] [place], [place] [address]    Culture  Setup Time   Final    GRAM POSITIVE COCCI IN BOTH AEROBIC AND ANAEROBIC BOTTLES CRITICAL RESULT CALLED TO, READ BACK BY AND VERIFIED WITH: PHARMD JUSTIN L [id number] FCP CRITICAL RESULT CALLED TO, READ BACK BY AND VERIFIED WITH: B GREEN,PHARMD AT 2206 12/28/2018 BY L BENFIELD Performed at  [facility], [address], [place], [place] [address]    Culture METHICILLIN RESISTANT STAPHYLOCOCCUS AUREUS (A)  Final   Report Status 12/30/2018 FINAL  Final   Organism ID, Bacteria METHICILLIN RESISTANT STAPHYLOCOCCUS AUREUS  Final      Susceptibility   Methicillin resistant staphylococcus aureus - MIC*    CIPROFLOXACIN >=8 RESISTANT Resistant     ERYTHROMYCIN >=8 RESISTANT Resistant     GENTAMICIN <=0.5 SENSITIVE Sensitive     OXACILLIN >=4 RESISTANT Resistant     TETRACYCLINE <=1 SENSITIVE Sensitive     VANCOMYCIN <=0.5 SENSITIVE Sensitive     TRIMETH/SULFA <=10 SENSITIVE Sensitive     CLINDAMYCIN <=0.25 SENSITIVE Sensitive     RIFAMPIN <=0.5 SENSITIVE Sensitive     Inducible Clindamycin NEGATIVE Sensitive     * METHICILLIN RESISTANT STAPHYLOCOCCUS AUREUS  Blood culture (routine x 2)     Status: Abnormal   Collection Time: 12/28/18  7:47 AM   Specimen: BLOOD  Result Value Ref Range Status   Specimen Description   Final    BLOOD RIGHT WRIST Performed at [facility], [address], [place], [place] [address]    Special Requests   Final     BOTTLES DRAWN AEROBIC AND ANAEROBIC Blood Culture results may not be optimal due to an inadequate volume of blood received in culture bottles Performed at [facility], [address], [place], [place] [address]    Culture  Setup Time   Final    GRAM POSITIVE COCCI IN CLUSTERS IN BOTH AEROBIC AND ANAEROBIC BOTTLES CRITICAL VALUE NOTED.  VALUE IS CONSISTENT WITH PREVIOUSLY REPORTED AND CALLED VALUE.    Culture (A)  Final    STAPHYLOCOCCUS AUREUS SUSCEPTIBILITIES PERFORMED ON PREVIOUS CULTURE WITHIN THE LAST 5 DAYS. Performed at [facility], [address] [place], [place] [address]    Report Status 12/30/2018 FINAL  Final  Urine culture     Status: Abnormal   Collection Time: 12/28/18  7:47 AM   Specimen: In/Out Cath Urine  Result Value Ref Range Status   Specimen Description   Final    IN/OUT CATH URINE Performed at [facility], [address], [place], [place] [address]    Special Requests   Final    NONE Performed at [facility], [address], [place], [place] [address]    Culture (A)  Final    30,000 COLONIES/mL METHICILLIN RESISTANT STAPHYLOCOCCUS AUREUS   Report Status 12/30/2018 FINAL  Final   Organism ID, Bacteria METHICILLIN RESISTANT STAPHYLOCOCCUS AUREUS (A)  Final      Susceptibility   Methicillin resistant staphylococcus aureus - MIC*    CIPROFLOXACIN >=8 RESISTANT Resistant     GENTAMICIN <=0.5 SENSITIVE Sensitive     NITROFURANTOIN <=16 SENSITIVE Sensitive     OXACILLIN >=4 RESISTANT Resistant     TETRACYCLINE <=1 SENSITIVE Sensitive     VANCOMYCIN 1 SENSITIVE Sensitive     TRIMETH/SULFA <=10 SENSITIVE Sensitive     CLINDAMYCIN <=0.25 SENSITIVE Sensitive     RIFAMPIN <=0.5 SENSITIVE Sensitive     Inducible Clindamycin NEGATIVE Sensitive     * 30,000 COLONIES/mL METHICILLIN RESISTANT STAPHYLOCOCCUS AUREUS  Blood Culture ID Panel (Reflexed)     Status: Abnormal   Collection Time: 12/28/18   7:47 AM  Result Value Ref Range Status   Enterococcus species NOT DETECTED NOT DETECTED Final   Listeria monocytogenes NOT DETECTED NOT DETECTED Final   Staphylococcus species DETECTED (A) NOT  DETECTED Final    Comment: CRITICAL RESULT CALLED TO, READ BACK BY AND VERIFIED WITH: B GREEN,PHARMD AT 2206 12/28/2018 BY L BENFIELD    Staphylococcus aureus (BCID) DETECTED (A) NOT DETECTED Final    Comment: Methicillin (oxacillin)-resistant Staphylococcus aureus (MRSA). MRSA is predictably resistant to beta-lactam antibiotics (except ceftaroline). Preferred therapy is vancomycin unless clinically contraindicated. Patient requires contact precautions if  hospitalized. CRITICAL RESULT CALLED TO, READ BACK BY AND VERIFIED WITH: B GREEN,PHARMD AT 2206 12/28/2018 BY L BENFIELD    Methicillin resistance DETECTED (A) NOT DETECTED Final    Comment: CRITICAL RESULT CALLED TO, READ BACK BY AND VERIFIED WITH: B GREEN,PHARMD AT 2206 12/28/2018 BY L BENFIELD    Streptococcus species NOT DETECTED NOT DETECTED Final   Streptococcus agalactiae NOT DETECTED NOT DETECTED Final   Streptococcus pneumoniae NOT DETECTED NOT DETECTED Final   Streptococcus pyogenes NOT DETECTED NOT DETECTED Final   Acinetobacter baumannii NOT DETECTED NOT DETECTED Final   Enterobacteriaceae species NOT DETECTED NOT DETECTED Final   Enterobacter cloacae complex NOT DETECTED NOT DETECTED Final   Escherichia coli NOT DETECTED NOT DETECTED Final   Klebsiella oxytoca NOT DETECTED NOT DETECTED Final   Klebsiella pneumoniae NOT DETECTED NOT DETECTED Final   Proteus species NOT DETECTED NOT DETECTED Final   Serratia marcescens NOT DETECTED NOT DETECTED Final   Haemophilus influenzae NOT DETECTED NOT DETECTED Final   Neisseria meningitidis NOT DETECTED NOT DETECTED Final   Pseudomonas aeruginosa NOT DETECTED NOT DETECTED Final   Candida albicans NOT DETECTED NOT DETECTED Final   Candida glabrata NOT DETECTED NOT DETECTED Final   Candida  krusei NOT DETECTED NOT DETECTED Final   Candida parapsilosis NOT DETECTED NOT DETECTED Final   Candida tropicalis NOT DETECTED NOT DETECTED Final    Comment: Performed at [facility], [address] [place], [place] [address]  MRSA PCR Screening     Status: Abnormal   Collection Time: 12/28/18 12:51 PM   Specimen: Nasal Mucosa; Nasopharyngeal  Result Value Ref Range Status   MRSA by PCR POSITIVE (A) NEGATIVE Final    Comment:        The GeneXpert MRSA Assay (FDA approved for NASAL specimens only), is one component of a comprehensive MRSA colonization surveillance program. It is not intended to diagnose MRSA infection nor to guide or monitor treatment for MRSA infections. RESULT CALLED TO, READ BACK BY AND VERIFIED WITH: K ZULETA,RN 12/28/18 1507 RHOLMES Performed at [facility], [address] [place], [place] [address]   Respiratory Panel by PCR     Status: None   Collection Time: 12/28/18  1:59 PM   Specimen: Nasopharyngeal Swab; Respiratory  Result Value Ref Range Status   Adenovirus NOT DETECTED NOT DETECTED Final   Coronavirus 229E NOT DETECTED NOT DETECTED Final    Comment: (NOTE) The Coronavirus on the Respiratory Panel, DOES NOT test for the novel  Coronavirus (2019 nCoV)    Coronavirus HKU1 NOT DETECTED NOT DETECTED Final   Coronavirus NL63 NOT DETECTED NOT DETECTED Final   Coronavirus OC43 NOT DETECTED NOT DETECTED Final   Metapneumovirus NOT DETECTED NOT DETECTED Final   Rhinovirus / Enterovirus NOT DETECTED NOT DETECTED Final   Influenza A NOT DETECTED NOT DETECTED Final   Influenza B NOT DETECTED NOT DETECTED Final   Parainfluenza Virus 1 NOT DETECTED NOT DETECTED Final   Parainfluenza Virus 2 NOT DETECTED NOT DETECTED Final   Parainfluenza Virus 3 NOT DETECTED NOT DETECTED Final   Parainfluenza Virus 4 NOT  DETECTED NOT DETECTED Final   Respiratory Syncytial Virus NOT DETECTED NOT DETECTED Final   Bordetella pertussis NOT  DETECTED NOT DETECTED Final   Chlamydophila pneumoniae NOT DETECTED NOT DETECTED Final   Mycoplasma pneumoniae NOT DETECTED NOT DETECTED Final    Comment: Performed at [facility], [address] [place], [place] [address]  SARS Coronavirus 2 by RT PCR (hospital order, performed in [facility] hospital lab) Nasopharyngeal Nasopharyngeal Swab     Status: None   Collection Time: 12/28/18  1:59 PM   Specimen: Nasopharyngeal Swab  Result Value Ref Range Status   SARS Coronavirus 2 NEGATIVE NEGATIVE Final    Comment: (NOTE) If result is NEGATIVE SARS-CoV-2 target nucleic acids are NOT DETECTED. The SARS-CoV-2 RNA is generally detectable in upper and lower  respiratory specimens during the acute phase of infection. The lowest  concentration of SARS-CoV-2 viral copies this assay can detect is 250  copies / mL. A negative result does not preclude SARS-CoV-2 infection  and should not be used as the sole basis for treatment or other  patient management decisions.  A negative result may occur with  improper specimen collection / handling, submission of specimen other  than nasopharyngeal swab, presence of viral mutation(s) within the  areas targeted by this assay, and inadequate number of viral copies  (<250 copies / mL). A negative result must be combined with clinical  observations, patient history, and epidemiological information. If result is POSITIVE SARS-CoV-2 target nucleic acids are DETECTED. The SARS-CoV-2 RNA is generally detectable in upper and lower  respiratory specimens dur ing the acute phase of infection.  Positive  results are indicative of active infection with SARS-CoV-2.  Clinical  correlation with patient history and other diagnostic information is  necessary to determine patient infection status.  Positive results do  not rule out bacterial infection or co-infection with other viruses. If result is PRESUMPTIVE POSTIVE SARS-CoV-2 nucleic acids MAY BE PRESENT.   A  presumptive positive result was obtained on the submitted specimen  and confirmed on repeat testing.  While 2019 novel coronavirus  (SARS-CoV-2) nucleic acids may be present in the submitted sample  additional confirmatory testing may be necessary for epidemiological  and / or clinical management purposes  to differentiate between  SARS-CoV-2 and other Sarbecovirus currently known to infect humans.  If clinically indicated additional testing with an alternate test  methodology [phone]) is advised. The SARS-CoV-2 RNA is generally  detectable in upper and lower respiratory sp ecimens during the acute  phase of infection. The expected result is Negative. Fact Sheet for Patients:  BoilerBrush.com.cy Fact Sheet for Healthcare Providers: https://pope.com/ This test is not yet approved or cleared by the [place] FDA and has been authorized for detection and/or diagnosis of SARS-CoV-2 by FDA under an Emergency Use Authorization (EUA).  This EUA will remain in effect (meaning this test can be used) for the duration of the COVID-19 declaration under Section 564(b)(1) of the Act, 21 U.S.C. section 360bbb-3(b)(1), unless the authorization is terminated or revoked sooner. Performed at [facility], [address], [place], [place] [address]   Culture, blood (Routine X 2) w Reflex to ID Panel     Status: None   Collection Time: 12/30/18  8:33 AM   Specimen: BLOOD RIGHT HAND  Result Value Ref Range Status   Specimen Description   Final    BLOOD RIGHT HAND Performed at [facility], [address] [place], [place] [address]  Special Requests   Final    BOTTLES DRAWN AEROBIC ONLY Blood Culture adequate volume Performed at [facility], [address] [place], [place] [address]    Culture   Final    NO GROWTH 5 DAYS Performed at [facility], [address], [place],  [place] [address]    Report Status 01/04/2019 FINAL  Final  Culture, blood (routine x 2)     Status: None (Preliminary result)   Collection Time: 01/05/19  5:01 PM   Specimen: BLOOD  Result Value Ref Range Status   Specimen Description BLOOD RIGHT ANTECUBITAL  Final   Special Requests   Final    BOTTLES DRAWN AEROBIC AND ANAEROBIC Blood Culture adequate volume   Culture   Final    NO GROWTH < 24 HOURS Performed at [facility], [address] [place], [place] [address]    Report Status PENDING  Incomplete    Labs: BNP (last 3 results) No results for input(s): BNP in the last 8760 hours. Basic Metabolic Panel: Recent Labs  Lab 01/02/19 0448 01/03/19 0334 01/04/19 0110 01/05/19 0412 01/06/19 0621  NA 137 140 138 139 138  K 3.9 3.9 3.8 3.8 3.9  CL 105 106 102 101 102  CO2 25 26 28 28 26   GLUCOSE 94 122* 112* 105* 114*  BUN 9 8 14 13 11   CREATININE 0.56* 0.51* 0.61 0.64 0.79  CALCIUM 8.3* 8.5* 8.5* 9.4 9.0  MG 1.8 1.9 2.0 2.1 2.0  PHOS 3.5 4.4 3.4 3.9 5.0*   Liver Function Tests: Recent Labs  Lab 01/01/19 0342 01/02/19 0448 01/03/19 0334 01/05/19 0412 01/06/19 0621  AST 22 26 22  36 47*  ALT 27 30 35 59* 61*  ALKPHOS 151* 155* 150* 180* 167*  BILITOT 0.4 0.2* 0.2* 0.4 0.6  PROT 5.9* 6.2* 6.4* 7.8 8.0  ALBUMIN 2.2* 2.2* 2.4* 2.9* 3.2*   No results for input(s): LIPASE, AMYLASE in the last 168 hours. No results for input(s): AMMONIA in the last 168 hours. CBC: Recent Labs  Lab 01/01/19 0342 01/02/19 0448 01/03/19 0334 01/04/19 0110 01/05/19 0412 01/06/19 0621  WBC 10.0 10.6* 10.3 8.3 11.5* 11.6*  NEUTROABS 6.9 6.1 6.3  --  8.0* 7.7  HGB 9.8* 10.6* 10.1* 10.2* 11.5* 11.3*  HCT 29.8* 32.8* 31.0* 31.9* 35.9* 35.3*  MCV 88.4 90.4 89.6 91.9 91.3 90.7  PLT 265 338 395 446* 548* 585*   Cardiac Enzymes: No results for input(s): CKTOTAL, CKMB, CKMBINDEX, TROPONINI in the last 168 hours. BNP: Invalid input(s): POCBNP CBG: Recent Labs  Lab 01/01/19 1854  01/02/19 0004 01/02/19 0533 01/02/19 1313 01/04/19 0007  GLUCAP 107* 126* 95 103* 117*   D-Dimer No results for input(s): DDIMER in the last 72 hours. Hgb A1c No results for input(s): HGBA1C in the last 72 hours. Lipid Profile No results for input(s): CHOL, HDL, LDLCALC, TRIG, CHOLHDL, LDLDIRECT in the last 72 hours. Thyroid function studies No results for input(s): TSH, T4TOTAL, T3FREE, THYROIDAB in the last 72 hours.  Invalid input(s): FREET3 Anemia work up No results for input(s): VITAMINB12, FOLATE, FERRITIN, TIBC, IRON, RETICCTPCT in the last 72 hours. Urinalysis    Component Value Date/Time   COLORURINE YELLOW 12/28/2018 0536   APPEARANCEUR CLEAR 12/28/2018 0536   LABSPEC 1.010 12/28/2018 0536   PHURINE 6.0 12/28/2018 0536   GLUCOSEU NEGATIVE 12/28/2018 0536   HGBUR SMALL (A) 12/28/2018 0536   BILIRUBINUR NEGATIVE 12/28/2018 0536   KETONESUR NEGATIVE 12/28/2018 0536   PROTEINUR  NEGATIVE 12/28/2018 0536   NITRITE NEGATIVE 12/28/2018 0536   LEUKOCYTESUR NEGATIVE 12/28/2018 0536   Sepsis Labs Invalid input(s): PROCALCITONIN,  WBC,  LACTICIDVEN Microbiology Recent Results (from the past 240 hour(s))  SARS CORONAVIRUS 2 (TAT 6-24 HRS) Nasopharyngeal Nasopharyngeal Swab     Status: None   Collection Time: 12/28/18  5:37 AM   Specimen: Nasopharyngeal Swab  Result Value Ref Range Status   SARS Coronavirus 2 NEGATIVE NEGATIVE Final    Comment: (NOTE) SARS-CoV-2 target nucleic acids are NOT DETECTED. The SARS-CoV-2 RNA is generally detectable in upper and lower respiratory specimens during the acute phase of infection. Negative results do not preclude SARS-CoV-2 infection, do not rule out co-infections with other pathogens, and should not be used as the sole basis for treatment or other patient management decisions. Negative results must be combined with clinical observations, patient history, and epidemiological information. The expected result is Negative. Fact  Sheet for Patients: HairSlick.no Fact Sheet for Healthcare Providers: quierodirigir.com This test is not yet approved or cleared by the [place] FDA and  has been authorized for detection and/or diagnosis of SARS-CoV-2 by FDA under an Emergency Use Authorization (EUA). This EUA will remain  in effect (meaning this test can be used) for the duration of the COVID-19 declaration under Section 56 4(b)(1) of the Act, 21 U.S.C. section 360bbb-3(b)(1), unless the authorization is terminated or revoked sooner. Performed at [facility], [address], [place], [place] [address]   Blood culture (routine x 2)     Status: Abnormal   Collection Time: 12/28/18  7:47 AM   Specimen: BLOOD RIGHT ARM  Result Value Ref Range Status   Specimen Description   Final    BLOOD RIGHT ARM Performed at [facility], [address] [place], [place] [address]    Special Requests   Final    BOTTLES DRAWN AEROBIC AND ANAEROBIC Blood Culture results may not be optimal due to an excessive volume of blood received in culture bottles Performed at [facility], [address] [place], [place] [address]    Culture  Setup Time   Final    GRAM POSITIVE COCCI IN BOTH AEROBIC AND ANAEROBIC BOTTLES CRITICAL RESULT CALLED TO, READ BACK BY AND VERIFIED WITH: PHARMD JUSTIN L 1948 [id number] FCP CRITICAL RESULT CALLED TO, READ BACK BY AND VERIFIED WITH: B GREEN,PHARMD AT 2206 12/28/2018 BY L BENFIELD Performed at [facility], [address], [place], [place] [address]    Culture METHICILLIN RESISTANT STAPHYLOCOCCUS AUREUS (A)  Final   Report Status 12/30/2018 FINAL  Final   Organism ID, Bacteria METHICILLIN RESISTANT STAPHYLOCOCCUS AUREUS  Final      Susceptibility   Methicillin resistant staphylococcus aureus - MIC*    CIPROFLOXACIN >=8 RESISTANT Resistant     ERYTHROMYCIN >=8 RESISTANT Resistant     GENTAMICIN <=0.5 SENSITIVE  Sensitive     OXACILLIN >=4 RESISTANT Resistant     TETRACYCLINE <=1 SENSITIVE Sensitive     VANCOMYCIN <=0.5 SENSITIVE Sensitive     TRIMETH/SULFA <=10 SENSITIVE Sensitive     CLINDAMYCIN <=0.25 SENSITIVE Sensitive     RIFAMPIN <=0.5 SENSITIVE Sensitive     Inducible Clindamycin NEGATIVE Sensitive     * METHICILLIN RESISTANT STAPHYLOCOCCUS AUREUS  Blood culture (routine x 2)     Status: Abnormal   Collection Time: 12/28/18  7:47 AM   Specimen: BLOOD  Result Value Ref Range Status   Specimen Description   Final  BLOOD RIGHT WRIST Performed at [facility], [address] [place], [place] [address]    Special Requests   Final    BOTTLES DRAWN AEROBIC AND ANAEROBIC Blood Culture results may not be optimal due to an inadequate volume of blood received in culture bottles Performed at [facility], [address] [place], [place] [address]    Culture  Setup Time   Final    GRAM POSITIVE COCCI IN CLUSTERS IN BOTH AEROBIC AND ANAEROBIC BOTTLES CRITICAL VALUE NOTED.  VALUE IS CONSISTENT WITH PREVIOUSLY REPORTED AND CALLED VALUE.    Culture (A)  Final    STAPHYLOCOCCUS AUREUS SUSCEPTIBILITIES PERFORMED ON PREVIOUS CULTURE WITHIN THE LAST 5 DAYS. Performed at [facility], [address], [place], [place] [address]    Report Status 12/30/2018 FINAL  Final  Urine culture     Status: Abnormal   Collection Time: 12/28/18  7:47 AM   Specimen: In/Out Cath Urine  Result Value Ref Range Status   Specimen Description   Final    IN/OUT CATH URINE Performed at [facility], [address] [place], [place] [address]    Special Requests   Final    NONE Performed at [facility], [address] [place], [place] [address]    Culture (A)  Final    30,000 COLONIES/mL METHICILLIN RESISTANT STAPHYLOCOCCUS AUREUS   Report Status 12/30/2018 FINAL  Final   Organism ID, Bacteria METHICILLIN RESISTANT STAPHYLOCOCCUS  AUREUS (A)  Final      Susceptibility   Methicillin resistant staphylococcus aureus - MIC*    CIPROFLOXACIN >=8 RESISTANT Resistant     GENTAMICIN <=0.5 SENSITIVE Sensitive     NITROFURANTOIN <=16 SENSITIVE Sensitive     OXACILLIN >=4 RESISTANT Resistant     TETRACYCLINE <=1 SENSITIVE Sensitive     VANCOMYCIN 1 SENSITIVE Sensitive     TRIMETH/SULFA <=10 SENSITIVE Sensitive     CLINDAMYCIN <=0.25 SENSITIVE Sensitive     RIFAMPIN <=0.5 SENSITIVE Sensitive     Inducible Clindamycin NEGATIVE Sensitive     * 30,000 COLONIES/mL METHICILLIN RESISTANT STAPHYLOCOCCUS AUREUS  Blood Culture ID Panel (Reflexed)     Status: Abnormal   Collection Time: 12/28/18  7:47 AM  Result Value Ref Range Status   Enterococcus species NOT DETECTED NOT DETECTED Final   Listeria monocytogenes NOT DETECTED NOT DETECTED Final   Staphylococcus species DETECTED (A) NOT DETECTED Final    Comment: CRITICAL RESULT CALLED TO, READ BACK BY AND VERIFIED WITH: B GREEN,PHARMD AT 2206 12/28/2018 BY L BENFIELD    Staphylococcus aureus (BCID) DETECTED (A) NOT DETECTED Final    Comment: Methicillin (oxacillin)-resistant Staphylococcus aureus (MRSA). MRSA is predictably resistant to beta-lactam antibiotics (except ceftaroline). Preferred therapy is vancomycin unless clinically contraindicated. Patient requires contact precautions if  hospitalized. CRITICAL RESULT CALLED TO, READ BACK BY AND VERIFIED WITH: B GREEN,PHARMD AT 2206 12/28/2018 BY L BENFIELD    Methicillin resistance DETECTED (A) NOT DETECTED Final    Comment: CRITICAL RESULT CALLED TO, READ BACK BY AND VERIFIED WITH: B GREEN,PHARMD AT 2206 12/28/2018 BY L BENFIELD    Streptococcus species NOT DETECTED NOT DETECTED Final   Streptococcus agalactiae NOT DETECTED NOT DETECTED Final   Streptococcus pneumoniae NOT DETECTED NOT DETECTED Final   Streptococcus pyogenes NOT DETECTED NOT DETECTED Final   Acinetobacter baumannii NOT DETECTED NOT DETECTED Final    Enterobacteriaceae species NOT DETECTED NOT DETECTED Final   Enterobacter cloacae complex NOT DETECTED NOT DETECTED Final  Escherichia coli NOT DETECTED NOT DETECTED Final   Klebsiella oxytoca NOT DETECTED NOT DETECTED Final   Klebsiella pneumoniae NOT DETECTED NOT DETECTED Final   Proteus species NOT DETECTED NOT DETECTED Final   Serratia marcescens NOT DETECTED NOT DETECTED Final   Haemophilus influenzae NOT DETECTED NOT DETECTED Final   Neisseria meningitidis NOT DETECTED NOT DETECTED Final   Pseudomonas aeruginosa NOT DETECTED NOT DETECTED Final   Candida albicans NOT DETECTED NOT DETECTED Final   Candida glabrata NOT DETECTED NOT DETECTED Final   Candida krusei NOT DETECTED NOT DETECTED Final   Candida parapsilosis NOT DETECTED NOT DETECTED Final   Candida tropicalis NOT DETECTED NOT DETECTED Final    Comment: Performed at [facility], [address] [place], [place] [address]  MRSA PCR Screening     Status: Abnormal   Collection Time: 12/28/18 12:51 PM   Specimen: Nasal Mucosa; Nasopharyngeal  Result Value Ref Range Status   MRSA by PCR POSITIVE (A) NEGATIVE Final    Comment:        The GeneXpert MRSA Assay (FDA approved for NASAL specimens only), is one component of a comprehensive MRSA colonization surveillance program. It is not intended to diagnose MRSA infection nor to guide or monitor treatment for MRSA infections. RESULT CALLED TO, READ BACK BY AND VERIFIED WITH: K ZULETA,RN 12/28/18 1507 RHOLMES Performed at [facility], [address] [place], [place] [address]   Respiratory Panel by PCR     Status: None   Collection Time: 12/28/18  1:59 PM   Specimen: Nasopharyngeal Swab; Respiratory  Result Value Ref Range Status   Adenovirus NOT DETECTED NOT DETECTED Final   Coronavirus 229E NOT DETECTED NOT DETECTED Final    Comment: (NOTE) The Coronavirus on the Respiratory Panel, DOES NOT test for the novel  Coronavirus (2019 nCoV)     Coronavirus HKU1 NOT DETECTED NOT DETECTED Final   Coronavirus NL63 NOT DETECTED NOT DETECTED Final   Coronavirus OC43 NOT DETECTED NOT DETECTED Final   Metapneumovirus NOT DETECTED NOT DETECTED Final   Rhinovirus / Enterovirus NOT DETECTED NOT DETECTED Final   Influenza A NOT DETECTED NOT DETECTED Final   Influenza B NOT DETECTED NOT DETECTED Final   Parainfluenza Virus 1 NOT DETECTED NOT DETECTED Final   Parainfluenza Virus 2 NOT DETECTED NOT DETECTED Final   Parainfluenza Virus 3 NOT DETECTED NOT DETECTED Final   Parainfluenza Virus 4 NOT DETECTED NOT DETECTED Final   Respiratory Syncytial Virus NOT DETECTED NOT DETECTED Final   Bordetella pertussis NOT DETECTED NOT DETECTED Final   Chlamydophila pneumoniae NOT DETECTED NOT DETECTED Final   Mycoplasma pneumoniae NOT DETECTED NOT DETECTED Final    Comment: Performed at [facility], [address], [place], [place] [address]  SARS Coronavirus 2 by RT PCR (hospital order, performed in [facility] hospital lab) Nasopharyngeal Nasopharyngeal Swab     Status: None   Collection Time: 12/28/18  1:59 PM   Specimen: Nasopharyngeal Swab  Result Value Ref Range Status   SARS Coronavirus 2 NEGATIVE NEGATIVE Final    Comment: (NOTE) If result is NEGATIVE SARS-CoV-2 target nucleic acids are NOT DETECTED. The SARS-CoV-2 RNA is generally detectable in upper and lower  respiratory specimens during the acute phase of infection. The lowest  concentration of SARS-CoV-2 viral copies this assay can detect is 250  copies / mL. A negative result does not preclude SARS-CoV-2 infection  and should not be used as the sole basis for treatment or other  patient management decisions.  A negative result may occur with  improper specimen collection / handling, submission of specimen other  than nasopharyngeal swab, presence of viral mutation(s) within the  areas targeted by this assay, and inadequate number of viral copies  (<250 copies / mL). A negative  result must be combined with clinical  observations, patient history, and epidemiological information. If result is POSITIVE SARS-CoV-2 target nucleic acids are DETECTED. The SARS-CoV-2 RNA is generally detectable in upper and lower  respiratory specimens dur ing the acute phase of infection.  Positive  results are indicative of active infection with SARS-CoV-2.  Clinical  correlation with patient history and other diagnostic information is  necessary to determine patient infection status.  Positive results do  not rule out bacterial infection or co-infection with other viruses. If result is PRESUMPTIVE POSTIVE SARS-CoV-2 nucleic acids MAY BE PRESENT.   A presumptive positive result was obtained on the submitted specimen  and confirmed on repeat testing.  While 2019 novel coronavirus  (SARS-CoV-2) nucleic acids may be present in the submitted sample  additional confirmatory testing may be necessary for epidemiological  and / or clinical management purposes  to differentiate between  SARS-CoV-2 and other Sarbecovirus currently known to infect humans.  If clinically indicated additional testing with an alternate test  methodology [phone]) is advised. The SARS-CoV-2 RNA is generally  detectable in upper and lower respiratory sp ecimens during the acute  phase of infection. The expected result is Negative. Fact Sheet for Patients:  BoilerBrush.com.cy Fact Sheet for Healthcare Providers: https://pope.com/ This test is not yet approved or cleared by the [place] FDA and has been authorized for detection and/or diagnosis of SARS-CoV-2 by FDA under an Emergency Use Authorization (EUA).  This EUA will remain in effect (meaning this test can be used) for the duration of the COVID-19 declaration under Section 564(b)(1) of the Act, 21 U.S.C. section 360bbb-3(b)(1), unless the authorization is terminated or revoked sooner. Performed at [facility], [address], [place], [place] [address]   Culture, blood (Routine X 2) w Reflex to ID Panel     Status: None   Collection Time: 12/30/18  8:33 AM   Specimen: BLOOD RIGHT HAND  Result Value Ref Range Status   Specimen Description   Final    BLOOD RIGHT HAND Performed at [facility], [address], [place], [place] [address]    Special Requests   Final    BOTTLES DRAWN AEROBIC ONLY Blood Culture adequate volume Performed at [facility], [address] [place], [place] [address]    Culture   Final    NO GROWTH 5 DAYS Performed at [facility], [address], [place], [place] [address]    Report Status 01/04/2019 FINAL  Final  Culture, blood (routine x 2)     Status: None (Preliminary result)   Collection Time: 01/05/19  5:01 PM   Specimen: BLOOD  Result Value Ref Range Status   Specimen Description BLOOD RIGHT ANTECUBITAL  Final   Special Requests   Final    BOTTLES DRAWN AEROBIC AND ANAEROBIC Blood Culture adequate volume   Culture   Final    NO GROWTH < 24 HOURS Performed at [facility], [address] [place], [place] [address]    Report Status PENDING  Incomplete   Time coordinating discharge: Over 35 minutes  SIGNED:  Merlene Laughter, DO [facility] 01/06/2019, 7:15 PM Pager is on AMION  If 7PM-7AM, please contact night-coverage [email or web address] Password [email or web address]

## 2019-01-06 NOTE — Progress Notes (Signed)
Pt. Has decided to leave against medical advice. He has been using drug during his entire admission until he discarded them earlier today. He is horrified of detoxing from drugs. Hospital is unable to give him medication he desire to reduce effects of detox. After talking to MD, he decided that he would rather take illegal prescription of bupherinone to withdrawal than stay in hospital and detox. IV has been deaccessed. AMA papers have been signed. Awaiting ride from dad, where he states he cant get any more drugs.

## 2019-01-06 NOTE — Progress Notes (Signed)
MEWS/VS Documentation      01/05/2019 1921 01/05/2019 1955 01/06/2019 0316 01/06/2019 0536   MEWS Score:  0  0  2  2   MEWS Score Color:  Green  Green  Yellow  Yellow   Resp:  20  -  18  20   Pulse:  91  -  (!) 118  (!) 117   BP:  125/70  -  118/83  119/78   Temp:  97.6 F (36.4 C)  -  98 F (36.7 C)  97.6 F (36.4 C)   O2 Device:  Room Air  -  Room Air  Room Air   Level of Consciousness:  -  Alert  -  -     Pt. MEWs is yellow at shift change due to HR at 117. Pt. Is restless and may have recently taken unprescribed medication; New UDS showed amphetamines that weren't on previous UDS. Notified by off going RN that HR has been ST overnight. No new acute change. Will discus with MD when rounding.

## 2019-01-10 LAB — CULTURE, BLOOD (ROUTINE X 2)
Culture: NO GROWTH
Special Requests: ADEQUATE

## 2019-01-15 LAB — CULTURE, BLOOD (ROUTINE X 2)
Culture: NO GROWTH
Special Requests: ADEQUATE

## 2019-08-18 ENCOUNTER — Emergency Department (HOSPITAL_COMMUNITY)

## 2019-08-18 ENCOUNTER — Encounter (HOSPITAL_COMMUNITY): Payer: Self-pay

## 2019-08-18 ENCOUNTER — Inpatient Hospital Stay (HOSPITAL_COMMUNITY)
Admission: EM | Admit: 2019-08-18 | Discharge: 2019-08-20 | DRG: 896 | Disposition: A | Discharge status: Home / Self Care | Attending: Internal Medicine | Admitting: Internal Medicine

## 2019-08-18 ENCOUNTER — Other Ambulatory Visit: Payer: Self-pay

## 2019-08-18 DIAGNOSIS — J9601 Acute respiratory failure with hypoxia: Secondary | ICD-10-CM | POA: Diagnosis present

## 2019-08-18 DIAGNOSIS — F1910 Other psychoactive substance abuse, uncomplicated: Secondary | ICD-10-CM

## 2019-08-18 DIAGNOSIS — F1123 Opioid dependence with withdrawal: Secondary | ICD-10-CM | POA: Diagnosis not present

## 2019-08-18 DIAGNOSIS — M545 Low back pain: Secondary | ICD-10-CM | POA: Diagnosis present

## 2019-08-18 DIAGNOSIS — R52 Pain, unspecified: Secondary | ICD-10-CM

## 2019-08-18 DIAGNOSIS — F329 Major depressive disorder, single episode, unspecified: Secondary | ICD-10-CM | POA: Diagnosis present

## 2019-08-18 DIAGNOSIS — Z0189 Encounter for other specified special examinations: Secondary | ICD-10-CM

## 2019-08-18 DIAGNOSIS — J45909 Unspecified asthma, uncomplicated: Secondary | ICD-10-CM | POA: Diagnosis present

## 2019-08-18 DIAGNOSIS — F1193 Opioid use, unspecified with withdrawal: Secondary | ICD-10-CM

## 2019-08-18 DIAGNOSIS — R4182 Altered mental status, unspecified: Secondary | ICD-10-CM | POA: Diagnosis not present

## 2019-08-18 DIAGNOSIS — R451 Restlessness and agitation: Secondary | ICD-10-CM

## 2019-08-18 DIAGNOSIS — R202 Paresthesia of skin: Secondary | ICD-10-CM | POA: Diagnosis present

## 2019-08-18 DIAGNOSIS — F909 Attention-deficit hyperactivity disorder, unspecified type: Secondary | ICD-10-CM | POA: Diagnosis present

## 2019-08-18 DIAGNOSIS — Z8679 Personal history of other diseases of the circulatory system: Secondary | ICD-10-CM

## 2019-08-18 DIAGNOSIS — R410 Disorientation, unspecified: Secondary | ICD-10-CM

## 2019-08-18 DIAGNOSIS — F17210 Nicotine dependence, cigarettes, uncomplicated: Secondary | ICD-10-CM | POA: Diagnosis present

## 2019-08-18 DIAGNOSIS — Z20822 Contact with and (suspected) exposure to covid-19: Secondary | ICD-10-CM | POA: Diagnosis present

## 2019-08-18 DIAGNOSIS — Z825 Family history of asthma and other chronic lower respiratory diseases: Secondary | ICD-10-CM

## 2019-08-18 LAB — CBC WITH DIFFERENTIAL/PLATELET
Abs Immature Granulocytes: 0.02 10*3/uL (ref 0.00–0.07)
Basophils Absolute: 0 10*3/uL (ref 0.0–0.1)
Basophils Relative: 0 %
Eosinophils Absolute: 0 10*3/uL (ref 0.0–0.5)
Eosinophils Relative: 0 %
HCT: 38.9 % — ABNORMAL LOW (ref 39.0–52.0)
Hemoglobin: 12.9 g/dL — ABNORMAL LOW (ref 13.0–17.0)
Immature Granulocytes: 0 %
Lymphocytes Relative: 29 %
Lymphs Abs: 2.6 10*3/uL (ref 0.7–4.0)
MCH: 28 pg (ref 26.0–34.0)
MCHC: 33.2 g/dL (ref 30.0–36.0)
MCV: 84.6 fL (ref 80.0–100.0)
Monocytes Absolute: 0.6 10*3/uL (ref 0.1–1.0)
Monocytes Relative: 7 %
Neutro Abs: 5.8 10*3/uL (ref 1.7–7.7)
Neutrophils Relative %: 64 %
Platelets: 290 10*3/uL (ref 150–400)
RBC: 4.6 MIL/uL (ref 4.22–5.81)
RDW: 13.8 % (ref 11.5–15.5)
WBC: 9.1 10*3/uL (ref 4.0–10.5)
nRBC: 0 % (ref 0.0–0.2)

## 2019-08-18 LAB — CBG MONITORING, ED: Glucose-Capillary: 72 mg/dL (ref 70–99)

## 2019-08-18 LAB — BLOOD GAS, ARTERIAL
Acid-base deficit: 0.5 mmol/L (ref 0.0–2.0)
Bicarbonate: 22 mmol/L (ref 20.0–28.0)
FIO2: 100
Patient temperature: 98.2
pCO2 arterial: 30.4 mmHg — ABNORMAL LOW (ref 32.0–48.0)
pH, Arterial: 7.472 — ABNORMAL HIGH (ref 7.350–7.450)
pO2, Arterial: 493 mmHg — ABNORMAL HIGH (ref 83.0–108.0)

## 2019-08-18 LAB — SALICYLATE LEVEL: Salicylate Lvl: <7 mg/dL — ABNORMAL LOW (ref 7.0–30.0)

## 2019-08-18 LAB — COMPREHENSIVE METABOLIC PANEL
ALT: 96 U/L — ABNORMAL HIGH (ref 0–44)
AST: 49 U/L — ABNORMAL HIGH (ref 15–41)
Albumin: 4 g/dL (ref 3.5–5.0)
Alkaline Phosphatase: 113 U/L (ref 38–126)
Anion gap: 7 (ref 5–15)
BUN: 15 mg/dL (ref 6–20)
CO2: 27 mmol/L (ref 22–32)
Calcium: 9 mg/dL (ref 8.9–10.3)
Chloride: 108 mmol/L (ref 98–111)
Creatinine, Ser: 0.81 mg/dL (ref 0.61–1.24)
GFR calc Af Amer: >60 mL/min (ref >60)
GFR calc non Af Amer: >60 mL/min (ref >60)
Glucose, Bld: 114 mg/dL — ABNORMAL HIGH (ref 70–99)
Potassium: 3.5 mmol/L (ref 3.5–5.1)
Sodium: 142 mmol/L (ref 135–145)
Total Bilirubin: 0.7 mg/dL (ref 0.3–1.2)
Total Protein: 7.2 g/dL (ref 6.5–8.1)

## 2019-08-18 LAB — RAPID URINE DRUG SCREEN, HOSP PERFORMED
Amphetamines: POSITIVE — AB
Barbiturates: NOT DETECTED
Benzodiazepines: POSITIVE — AB
Cocaine: NOT DETECTED
Opiates: NOT DETECTED
Tetrahydrocannabinol: NOT DETECTED

## 2019-08-18 LAB — ACETAMINOPHEN LEVEL: Acetaminophen (Tylenol), Serum: <10 ug/mL — ABNORMAL LOW (ref 10–30)

## 2019-08-18 LAB — CK: Total CK: 198 U/L (ref 49–397)

## 2019-08-18 LAB — ETHANOL: Alcohol, Ethyl (B): <10 mg/dL (ref <10)

## 2019-08-18 MED ORDER — DICYCLOMINE HCL 20 MG PO TABS
20.0000 mg | ORAL_TABLET | Freq: Four times a day (QID) | ORAL | Status: DC | PRN
  Filled 2019-08-18: qty 1

## 2019-08-18 MED ORDER — LORAZEPAM 2 MG/ML IJ SOLN
1.0000 mg | Freq: Once | INTRAMUSCULAR | Status: AC
  Administered 2019-08-18: 1 mg via INTRAVENOUS
  Filled 2019-08-18: qty 1

## 2019-08-18 MED ORDER — STERILE WATER FOR INJECTION IJ SOLN
INTRAMUSCULAR | Status: AC
  Administered 2019-08-18: 1.2 mL
  Filled 2019-08-18: qty 10

## 2019-08-18 MED ORDER — HYDROXYZINE HCL 25 MG PO TABS: 25.0000 mg | ORAL_TABLET | Freq: Four times a day (QID) | ORAL | Status: DC | PRN

## 2019-08-18 MED ORDER — LOPERAMIDE HCL 2 MG PO CAPS: 2.0000 mg | ORAL_CAPSULE | ORAL | Status: DC | PRN

## 2019-08-18 MED ORDER — PROPOFOL 500 MG/50ML IV EMUL
INTRAVENOUS | Status: AC
  Filled 2019-08-18: qty 50

## 2019-08-18 MED ORDER — SODIUM CHLORIDE 0.9 % IV BOLUS
1000.0000 mL | Freq: Once | INTRAVENOUS | Status: AC
  Administered 2019-08-18: 1000 mL via INTRAVENOUS

## 2019-08-18 MED ORDER — STERILE WATER FOR INJECTION IJ SOLN
INTRAMUSCULAR | Status: AC
  Filled 2019-08-18: qty 10

## 2019-08-18 MED ORDER — PROPOFOL 1000 MG/100ML IV EMUL
5.0000 ug/kg/min | INTRAVENOUS | Status: DC
  Administered 2019-08-18: 5 ug/kg/min via INTRAVENOUS
  Filled 2019-08-18 (×2): qty 100

## 2019-08-18 MED ORDER — CLONIDINE HCL 0.1 MG PO TABS: 0.1000 mg | ORAL_TABLET | Freq: Every day | ORAL | Status: DC

## 2019-08-18 MED ORDER — ETOMIDATE 2 MG/ML IV SOLN
10.0000 mg | Freq: Once | INTRAVENOUS | Status: AC
  Administered 2019-08-18: 20 mg via INTRAVENOUS
  Filled 2019-08-18: qty 10

## 2019-08-18 MED ORDER — CLONIDINE HCL 0.1 MG PO TABS
0.1000 mg | ORAL_TABLET | Freq: Four times a day (QID) | ORAL | Status: DC
  Administered 2019-08-19 – 2019-08-20 (×5): 0.1 mg via ORAL
  Filled 2019-08-18 (×6): qty 1

## 2019-08-18 MED ORDER — CLONIDINE HCL 0.1 MG PO TABS: 0.1000 mg | ORAL_TABLET | ORAL | Status: DC

## 2019-08-18 MED ORDER — METHOCARBAMOL 500 MG PO TABS: 500.0000 mg | ORAL_TABLET | Freq: Three times a day (TID) | ORAL | Status: DC | PRN

## 2019-08-18 MED ORDER — ROCURONIUM BROMIDE 100 MG/10ML IV SOLN
70.0000 mg | Freq: Once | INTRAVENOUS | Status: DC
  Filled 2019-08-18: qty 7

## 2019-08-18 MED ORDER — ZIPRASIDONE MESYLATE 20 MG IM SOLR
10.0000 mg | Freq: Once | INTRAMUSCULAR | Status: AC
  Administered 2019-08-18: 10 mg via INTRAMUSCULAR
  Filled 2019-08-18: qty 20

## 2019-08-18 MED ORDER — DEXMEDETOMIDINE HCL IN NACL 200 MCG/50ML IV SOLN
0.4000 ug/kg/h | INTRAVENOUS | Status: DC
  Filled 2019-08-18: qty 50

## 2019-08-18 MED ORDER — ROCURONIUM BROMIDE 10 MG/ML (PF) SYRINGE
70.0000 mg | PREFILLED_SYRINGE | Freq: Once | INTRAVENOUS | Status: AC
  Administered 2019-08-18: 70 mg via INTRAVENOUS

## 2019-08-18 MED ORDER — ROCURONIUM BROMIDE 10 MG/ML (PF) SYRINGE
PREFILLED_SYRINGE | INTRAVENOUS | Status: AC
  Filled 2019-08-18: qty 10

## 2019-08-18 MED ORDER — MIDAZOLAM HCL 2 MG/2ML IJ SOLN: 5.0000 mg | Freq: Once | INTRAMUSCULAR | Status: DC

## 2019-08-18 MED ORDER — ONDANSETRON 4 MG PO TBDP: 4.0000 mg | ORAL_TABLET | Freq: Four times a day (QID) | ORAL | Status: DC | PRN

## 2019-08-18 MED ORDER — NAPROXEN 500 MG PO TABS
500.0000 mg | ORAL_TABLET | Freq: Two times a day (BID) | ORAL | Status: DC | PRN
  Administered 2019-08-20 (×2): 500 mg via ORAL
  Filled 2019-08-18: qty 1
  Filled 2019-08-18: qty 2
  Filled 2019-08-18: qty 1

## 2019-08-18 NOTE — Progress Notes (Signed)
Pt transported from ED to CT and back on vent at 100% fio2.  Pt tolerated transport well without incident. 

## 2019-08-18 NOTE — ED Notes (Signed)
Pt had bowel movement on himself also throw up on the floor/bed, nurse and I clean him up and change his bed and housekeeping mop the floor.

## 2019-08-18 NOTE — ED Notes (Signed)
Pt is unable to provide urine sample at this time. 

## 2019-08-18 NOTE — ED Triage Notes (Signed)
Arrived by [facility] from jail. Patient was reportedly unconscious and given 12 mg of Narcan by staff at jail. 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 Patient was also spitting  And arrived in spit mask, which was removed on arrival to this facility

## 2019-08-18 NOTE — ED Notes (Signed)
25 @ teeth

## 2019-08-18 NOTE — ED Provider Notes (Signed)
[facility] DEPT Provider Note   CSN: [id number] Arrival date & time: 08/18/19  1518     History Chief Complaint  Patient presents with  . Altered Mental Status    Alex [name] is a 34 y.o. male.  Patient with history of opioid abuse/IV drug use, alcohol abuse, admission 12/2018 sepsis related to endocarditis --presents from jail with suspected opioid withdrawal.  History is limited due to patient agitation and active withdrawals.  Per EMS, patient was found unresponsive in jail and given 12 mg of Narcan.  Patient states that he was "pretended" to be unresponsive.  He was admitted to jail last night.  He states his last use of heroin was yesterday morning.  He when asked how much he uses he states "a lot".  He states that he uses every few hours.  When asked about alcohol, he states that he has not really been drinking.  Level 5 caveat due to agitation.        Past Medical History:  Diagnosis Date  . ADHD (attention deficit hyperactivity disorder)   . Allergy   . Asthma   . Depressed   . ETOH abuse   . History of suicidal tendencies     Patient Active Problem List   Diagnosis Date Noted  . Endocarditis of tricuspid valve   . IVDU (intravenous drug user)   . Sepsis ([place]) 12/28/2018  . Substance induced mood disorder ([place]) 04/24/2016  . Polysubstance dependence ([place]) 04/23/2016  . Major depressive disorder, single episode, severe without psychotic features ([place]) 04/23/2016  . 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 Tobacco use disorder 02/01/2014  . ADHD (attention deficit hyperactivity disorder)     Past Surgical History:  Procedure Laterality Date  . TEE WITHOUT CARDIOVERSION N/A 12/31/2018   Procedure: TRANSESOPHAGEAL ECHOCARDIOGRAM (TEE);  Surgeon: Sanda Klein, MD;  Location: [facility] ENDOSCOPY;  Service: Cardiovascular;  Laterality: N/A;       Family History  Problem Relation Age of  Onset  . COPD Other     Social History   Tobacco Use  . Smoking status: Current Every Day Smoker    Packs/day: 1.00    Years: 10.00    Pack years: 10.00    Types: Cigarettes  . Smokeless tobacco: Never Used  Substance Use Topics  . Alcohol use: Yes    Alcohol/week: 12.0 standard drinks    Types: 12 Cans of beer per week    Comment: 12 beers daily  . Drug use: Yes    Frequency: 6.0 times per week    Types: Marijuana, Cocaine, Heroin, Fentanyl    Comment: daily use cocaine (snort)    Home Medications Prior to Admission medications   Medication Sig Start Date End Date Taking? Authorizing Provider  albuterol (PROVENTIL HFA) 108 (90 Base) MCG/ACT inhaler Inhale 1-2 puffs into the lungs every 4 (four) hours as needed for wheezing or shortness of breath. 09/08/18   Wieters, Hallie C, PA-C  albuterol (PROVENTIL) (2.5 MG/3ML) 0.083% nebulizer solution Take 3 mLs (2.5 mg total) by nebulization every 6 (six) hours as needed for wheezing or shortness of breath. 09/08/18   Wieters, Hallie C, PA-C  hydroxypropyl methylcellulose / hypromellose (ISOPTO TEARS / GONIOVISC) 2.5 % ophthalmic solution Place 1 drop into both eyes 3 (three) times daily as needed for dry eyes.    [provider]  naproxen sodium (ALEVE) 220 MG tablet Take 440 mg by mouth 2 (two) times daily as needed (  pain).    [provider]  ipratropium-albuterol (DUONEB) 0.5-2.5 (3) MG/3ML SOLN Take 3 mLs by nebulization every 6 (six) hours as needed (wheezing). Patient not taking: Reported on 12/28/2018 04/14/18 12/28/18  Elpidio AnisUpstill, Shari, PA-C  montelukast (SINGULAIR) 10 MG tablet Take 1 tablet (10 mg total) by mouth at bedtime. Patient not taking: Reported on 12/28/2018 07/25/17 12/28/18  Georgetta HaberBurky, Natalie B, NP  traZODone (DESYREL) 50 MG tablet Take 1 tablet (50 mg total) by mouth at bedtime and may repeat dose one time if needed. Patient not taking: Reported on 07/05/2018 04/29/16 12/28/18  Oneta RackLewis, Tanika N, NP     Allergies    Patient has no known allergies.  Review of Systems   Review of Systems  Unable to perform ROS: Mental status change    Physical Exam Updated Vital Signs BP 126/76 (BP Location: Right Arm)   Pulse 78   Resp 16   SpO2 100%   Physical Exam Vitals and nursing note reviewed.  Constitutional:      Appearance: He is well-developed.     Comments: Patient is able to talk and give a limited history.  He is writhing around on the bed in ankle cuffs.  HENT:     Head: Normocephalic and atraumatic.     Mouth/Throat:     Mouth: Mucous membranes are dry.     Comments: Dry mucous membranes. Eyes:     General:        Right eye: No discharge.        Left eye: No discharge.     Conjunctiva/sclera: Conjunctivae normal.     Comments: Pupils are slightly dilated, moderately responsive to light.  Cardiovascular:     Rate and Rhythm: Normal rate and regular rhythm.     Heart sounds: Normal heart sounds.     Comments: No tachycardia. Pulmonary:     Effort: Pulmonary effort is normal.     Breath sounds: Normal breath sounds.  Abdominal:     Palpations: Abdomen is soft.     Tenderness: There is no abdominal tenderness. There is no guarding or rebound.  Musculoskeletal:     Cervical back: Normal range of motion and neck supple.  Skin:    General: Skin is warm and dry.  Neurological:     Mental Status: He is alert and oriented to person, place, and time.     Comments: Patient non-cooperative with neuro exam. He does give details about recent drug use. He appears uncomfortable.   Psychiatric:        Attention and Perception: He is inattentive.        Speech: Speech is rapid and pressured.        Behavior: Behavior is agitated and hyperactive.     ED Results / Procedures / Treatments   Labs (all labs ordered are listed, but only abnormal results are displayed) Labs Reviewed  COMPREHENSIVE METABOLIC PANEL - Abnormal; Notable for the following components:      Result  Value   Glucose, Bld 114 (*)    AST 49 (*)    ALT 96 (*)    All other components within normal limits  RAPID URINE DRUG SCREEN, HOSP PERFORMED - Abnormal; Notable for the following components:   Benzodiazepines POSITIVE (*)    Amphetamines POSITIVE (*)    All other components within normal limits  CBC WITH DIFFERENTIAL/PLATELET - Abnormal; Notable for the following components:   Hemoglobin 12.9 (*)    HCT 38.9 (*)    All  other components within normal limits  SALICYLATE LEVEL - Abnormal; Notable for the following components:   Salicylate Lvl <7.0 (*)    All other components within normal limits  ACETAMINOPHEN LEVEL - Abnormal; Notable for the following components:   Acetaminophen (Tylenol), Serum <10 (*)    All other components within normal limits  BLOOD GAS, ARTERIAL - Abnormal; Notable for the following components:   pH, Arterial 7.472 (*)    pCO2 arterial 30.4 (*)    pO2, Arterial 493 (*)    All other components within normal limits  CULTURE, BLOOD (ROUTINE X 2)  CULTURE, BLOOD (ROUTINE X 2)  SARS CORONAVIRUS 2 BY RT PCR (HOSPITAL ORDER, PERFORMED IN [facility])  ETHANOL  LACTIC ACID, PLASMA  LACTIC ACID, PLASMA  CK  CBG MONITORING, ED    EKG EKG Interpretation  Date/Time:  Tuesday August 18 2019 15:42:54 EDT Ventricular Rate:  83 PR Interval:    QRS Duration: 121 QT Interval:  414 QTC Calculation: 487 R Axis:   88 Text Interpretation: Sinus rhythm Nonspecific intraventricular conduction delay Repol abnrm suggests ischemia, inferior leads Minimal ST elevation, lateral leads Confirmed by Kennis Carina [phone]) on 08/18/2019 7:16:05 PM   Radiology CT Head Wo Contrast  Result Date: 08/18/2019 CLINICAL DATA:  Altered mental status EXAM: CT HEAD WITHOUT CONTRAST TECHNIQUE: Contiguous axial images were obtained from the base of the skull through the vertex without intravenous contrast. COMPARISON:  CT 11/21/2018 FINDINGS: Brain: No evidence of acute infarction,  hemorrhage, hydrocephalus, extra-axial collection or mass lesion/mass effect. Midline intracranial structures are unremarkable. Normally positioned cerebellar tonsils. Vascular: No hyperdense vessel or unexpected calcification. Skull: No calvarial fracture or suspicious osseous lesion. No scalp swelling or hematoma. Sinuses/Orbits: Secretions and fluid in the posterior nasopharynx as well as fluid levels in the posterior ethmoids likely related to instrumentation seen scout view. Lobular retention cyst in the left maxillary sinus. Remaining paranasal sinuses and mastoid air cells are predominantly clear. Included orbital structures are unremarkable. Other: None IMPRESSION: 1. No acute intracranial findings. 2. Secretions, fluid in the posterior nasopharynx as well as fluid levels in the posterior ethmoids likely related to intubation. Electronically Signed   By: Kreg Shropshire M.D.   On: 08/18/2019 22:53   DG Chest Port 1 View  Result Date: 08/18/2019 CLINICAL DATA:  Overdose, intubated EXAM: PORTABLE CHEST 1 VIEW COMPARISON:  12/28/2018 FINDINGS: Frontal view of the chest demonstrates endotracheal tube overlying tracheal air column tip midway between thoracic inlet and carina. Enteric catheter passes below diaphragm tip excluded by collimation, side port projects over gastric fundus. Cardiac silhouette is unremarkable. No airspace disease, effusion, or pneumothorax. No acute bony abnormalities. IMPRESSION: 1. Support devices as above. 2. No acute intrathoracic process. Electronically Signed   By: Sharlet Salina M.D.   On: 08/18/2019 22:55    Procedures Procedure Name: Intubation Date/Time: 08/18/2019 11:03 PM Performed by: Renne Crigler, PA-C Pre-anesthesia Checklist: Patient identified, Emergency Drugs available, Suction available, Timeout performed and Patient being monitored Oxygen Delivery Method: Non-rebreather mask Preoxygenation: Pre-oxygenation with 100% oxygen Induction Type: IV induction and  Rapid sequence Ventilation: Mask ventilation without difficulty Laryngoscope Size: 3 and Miller Tube size: 7.0 mm Number of attempts: 1 Airway Equipment and Method: Video-laryngoscopy Placement Confirmation: ETT inserted through vocal cords under direct vision,  Positive ETCO2,  CO2 detector and Breath sounds checked- equal and bilateral Secured at: 23 cm Tube secured with: ETT holder Dental Injury: Teeth and Oropharynx as per pre-operative assessment  Difficulty Due To: Difficulty  was unanticipated Future Recommendations: Recommend- induction with short-acting agent, and alternative techniques readily available Comments: Sedation performed by Dr. Pilar Plate who was at bedside for procedure.       (including critical care time)  Medications Ordered in ED Medications  dicyclomine (BENTYL) tablet 20 mg (has no administration in time range)  hydrOXYzine (ATARAX/VISTARIL) tablet 25 mg (has no administration in time range)  loperamide (IMODIUM) capsule 2-4 mg (has no administration in time range)  methocarbamol (ROBAXIN) tablet 500 mg (has no administration in time range)  naproxen (NAPROSYN) tablet 500 mg (has no administration in time range)  ondansetron (ZOFRAN-ODT) disintegrating tablet 4 mg (has no administration in time range)  cloNIDine (CATAPRES) tablet 0.1 mg (0 mg Oral Hold 08/18/19 2257)    Followed by  cloNIDine (CATAPRES) tablet 0.1 mg (has no administration in time range)    Followed by  cloNIDine (CATAPRES) tablet 0.1 mg (has no administration in time range)  midazolam (VERSED) injection 5 mg (5 mg Intramuscular Not Given 08/18/19 2257)  propofol (DIPRIVAN) 1000 MG/100ML infusion (5 mcg/kg/min  54 kg Intravenous New Bag/Given 08/18/19 2300)  propofol (DIPRIVAN) 500 MG/50ML infusion (has no administration in time range)  sodium chloride 0.9 % bolus 1,000 mL (0 mLs Intravenous Stopped 08/18/19 1824)  LORazepam (ATIVAN) injection 1 mg (1 mg Intravenous Given 08/18/19 1558)  LORazepam  (ATIVAN) injection 1 mg (1 mg Intravenous Given 08/18/19 1713)  ziprasidone (GEODON) injection 10 mg (10 mg Intramuscular Given 08/18/19 1824)  sterile water (preservative free) injection (1.2 mLs  Given 08/18/19 1824)  ziprasidone (GEODON) injection 10 mg (10 mg Intramuscular Given 08/18/19 1936)  sterile water (preservative free) injection (  Given by Other 08/18/19 1936)  etomidate (AMIDATE) injection 10 mg (20 mg Intravenous Given 08/18/19 2205)  rocuronium bromide 10 mg/mL (PF) syringe (70 mg Intravenous Given 08/18/19 2206)    ED Course  I have reviewed the triage vital signs and the nursing notes.  Pertinent labs & imaging results that were available during my care of the patient were reviewed by me and considered in my medical decision making (see chart for details).  Patient seen and examined.  Will treat suspected opioid withdrawals.  Labs, IV Ativan, IV fluids ordered.  Catapres protocol ordered.   Vital signs reviewed and are as follows: BP 126/76 (BP Location: Right Arm)   Pulse 78   Temp (!) 97.4 F (36.3 C)   Resp 16   SpO2 100%   4:31 PM Pt rechecked. Condition appears unchanged.   5:24 PM Condition unchanged. Additional ativan ordered earlier.   6:01 PM Patient discussed with and seen by Dr. Pilar Plate. Pt is now more confused. CT head, geodon, soft restraints ordered. Staff unable to obtain labs to this point given patient agitation. He will not leave monitoring leads on.   7:16 PM Patient still fighting. Rechecked. Additional geodon ordered after discussion with Dr. Pilar Plate. He is just being placed in restraints at this time. He will not hold still for CT imaging.  Patient is becoming progressively altered since my initial evaluation.  Airway continues to be patient.   7:51 PM Pt rechecked. Continues to thrash in bed. He is now on monitor however. No hypoxia, HR 90's. Discussed with Dr. Pilar Plate who will recheck.   9:15 PM Decision made to intubate patient given in ED for 5+ hrs and cannot  safely obtain blood, complete evaluation.  Patient has removed previous IV access and monitoring equipment.  Patient was held and IV was started.  He  was moved to resuscitation room.  10:13 PM Pt intubated. Propofol ordered. Will get to CT.   11:00 PM CXR reviewed. CT head results reviewed.   11:47 PM Patient rechecked. He is thrashing around somewhat, RN at bedside titrating propofol.   I spoke with Dr. Arsenio Loader of PCCM -- they will consult for admission.  CRITICAL CARE Performed by: Renne Crigler PA-C Total critical care time: 70 minutes Critical care time was exclusive of separately billable procedures and treating other patients. Critical care was necessary to treat or prevent imminent or life-threatening deterioration. Critical care was time spent personally by me on the following activities: development of treatment plan with patient and/or surrogate as well as nursing, discussions with consultants, evaluation of patient's response to treatment, examination of patient, obtaining history from patient or surrogate, ordering and performing treatments and interventions, ordering and review of laboratory studies, ordering and review of radiographic studies, pulse oximetry and re-evaluation of patient's condition.      MDM Rules/Calculators/A&P                      Admit.     Final Clinical Impression(s) / ED Diagnoses Final diagnoses:  Delirium  Agitation  Opioid withdrawal (HCC)  Polysubstance abuse [facility])    Rx / DC Orders ED Discharge Orders    None       Renne Crigler, PA-C 08/18/19 2350    Sabas Sous, MD 08/19/19 2332

## 2019-08-19 ENCOUNTER — Inpatient Hospital Stay (HOSPITAL_COMMUNITY)

## 2019-08-19 DIAGNOSIS — Z8679 Personal history of other diseases of the circulatory system: Secondary | ICD-10-CM | POA: Diagnosis not present

## 2019-08-19 DIAGNOSIS — F17210 Nicotine dependence, cigarettes, uncomplicated: Secondary | ICD-10-CM | POA: Diagnosis present

## 2019-08-19 DIAGNOSIS — R451 Restlessness and agitation: Secondary | ICD-10-CM | POA: Diagnosis present

## 2019-08-19 DIAGNOSIS — F1123 Opioid dependence with withdrawal: Secondary | ICD-10-CM | POA: Diagnosis present

## 2019-08-19 DIAGNOSIS — Z825 Family history of asthma and other chronic lower respiratory diseases: Secondary | ICD-10-CM | POA: Diagnosis not present

## 2019-08-19 DIAGNOSIS — Z20822 Contact with and (suspected) exposure to covid-19: Secondary | ICD-10-CM | POA: Diagnosis present

## 2019-08-19 DIAGNOSIS — R4182 Altered mental status, unspecified: Secondary | ICD-10-CM | POA: Diagnosis present

## 2019-08-19 DIAGNOSIS — J9601 Acute respiratory failure with hypoxia: Secondary | ICD-10-CM | POA: Diagnosis present

## 2019-08-19 DIAGNOSIS — J45909 Unspecified asthma, uncomplicated: Secondary | ICD-10-CM | POA: Diagnosis present

## 2019-08-19 DIAGNOSIS — F329 Major depressive disorder, single episode, unspecified: Secondary | ICD-10-CM | POA: Diagnosis present

## 2019-08-19 DIAGNOSIS — M545 Low back pain: Secondary | ICD-10-CM | POA: Diagnosis present

## 2019-08-19 DIAGNOSIS — F909 Attention-deficit hyperactivity disorder, unspecified type: Secondary | ICD-10-CM | POA: Diagnosis present

## 2019-08-19 DIAGNOSIS — R202 Paresthesia of skin: Secondary | ICD-10-CM | POA: Diagnosis present

## 2019-08-19 LAB — MRSA PCR SCREENING: MRSA by PCR: POSITIVE — AB

## 2019-08-19 LAB — SARS CORONAVIRUS 2 BY RT PCR (HOSPITAL ORDER, PERFORMED IN ~~LOC~~ HOSPITAL LAB): SARS Coronavirus 2: NEGATIVE

## 2019-08-19 LAB — LACTIC ACID, PLASMA
Lactic Acid, Venous: 0.9 mmol/L (ref 0.5–1.9)
Lactic Acid, Venous: 1.4 mmol/L (ref 0.5–1.9)

## 2019-08-19 MED ORDER — HALOPERIDOL LACTATE 5 MG/ML IJ SOLN
1.0000 mg | INTRAMUSCULAR | Status: DC | PRN
  Administered 2019-08-19: 4 mg via INTRAVENOUS
  Filled 2019-08-19: qty 1

## 2019-08-19 MED ORDER — POLYETHYLENE GLYCOL 3350 17 G PO PACK: 17.0000 g | PACK | Freq: Every day | ORAL | Status: DC | PRN

## 2019-08-19 MED ORDER — PANTOPRAZOLE SODIUM 40 MG IV SOLR: 40.0000 mg | Freq: Every day | INTRAVENOUS | Status: DC

## 2019-08-19 MED ORDER — ORAL CARE MOUTH RINSE: 15.0000 mL | Freq: Two times a day (BID) | OROMUCOSAL | Status: DC

## 2019-08-19 MED ORDER — FENTANYL 2500MCG IN NS 250ML (10MCG/ML) PREMIX INFUSION
0.0000 ug/h | INTRAVENOUS | Status: DC
  Administered 2019-08-19: 25 ug/h via INTRAVENOUS
  Filled 2019-08-19: qty 250

## 2019-08-19 MED ORDER — PHENOL 1.4 % MT LIQD
1.0000 | OROMUCOSAL | Status: DC | PRN
  Filled 2019-08-19: qty 177

## 2019-08-19 MED ORDER — ORAL CARE MOUTH RINSE
15.0000 mL | OROMUCOSAL | Status: DC
  Administered 2019-08-19: 15 mL via OROMUCOSAL

## 2019-08-19 MED ORDER — MIDAZOLAM HCL 2 MG/2ML IJ SOLN
4.0000 mg | Freq: Once | INTRAMUSCULAR | Status: AC
  Administered 2019-08-19: 4 mg via INTRAVENOUS
  Filled 2019-08-19: qty 4

## 2019-08-19 MED ORDER — DEXMEDETOMIDINE HCL IN NACL 200 MCG/50ML IV SOLN
0.4000 ug/kg/h | INTRAVENOUS | Status: DC
  Filled 2019-08-19: qty 50

## 2019-08-19 MED ORDER — MIDAZOLAM 50MG/50ML (1MG/ML) PREMIX INFUSION
0.5000 mg/h | INTRAVENOUS | Status: DC
  Administered 2019-08-19: 0.5 mg/h via INTRAVENOUS
  Filled 2019-08-19: qty 50

## 2019-08-19 MED ORDER — ENOXAPARIN SODIUM 40 MG/0.4ML ~~LOC~~ SOLN
40.0000 mg | SUBCUTANEOUS | Status: DC
  Administered 2019-08-19: 40 mg via SUBCUTANEOUS
  Filled 2019-08-19: qty 0.4

## 2019-08-19 MED ORDER — CHLORHEXIDINE GLUCONATE CLOTH 2 % EX PADS
6.0000 | MEDICATED_PAD | Freq: Every day | CUTANEOUS | Status: DC
  Administered 2019-08-19: 6 via TOPICAL

## 2019-08-19 MED ORDER — DOCUSATE SODIUM 50 MG/5ML PO LIQD
100.0000 mg | Freq: Two times a day (BID) | ORAL | Status: DC | PRN
  Filled 2019-08-19: qty 10

## 2019-08-19 MED ORDER — FOLIC ACID 5 MG/ML IJ SOLN
1.0000 mg | Freq: Every day | INTRAMUSCULAR | Status: DC
  Administered 2019-08-19 – 2019-08-20 (×2): 1 mg via INTRAVENOUS
  Filled 2019-08-19 (×2): qty 0.2

## 2019-08-19 MED ORDER — MIDAZOLAM HCL 2 MG/2ML IJ SOLN: 1.0000 mg | INTRAMUSCULAR | Status: DC | PRN

## 2019-08-19 MED ORDER — MUPIROCIN 2 % EX OINT
1.0000 "application " | TOPICAL_OINTMENT | Freq: Two times a day (BID) | CUTANEOUS | Status: DC
  Administered 2019-08-19 – 2019-08-20 (×2): 1 via NASAL
  Filled 2019-08-19 (×2): qty 22

## 2019-08-19 MED ORDER — SODIUM CHLORIDE 0.9 % IV SOLN: 1.0000 mg | Freq: Once | INTRAVENOUS | Status: DC

## 2019-08-19 MED ORDER — THIAMINE HCL 100 MG/ML IJ SOLN
100.0000 mg | Freq: Every day | INTRAMUSCULAR | Status: DC
  Administered 2019-08-19 – 2019-08-20 (×2): 100 mg via INTRAVENOUS
  Filled 2019-08-19 (×2): qty 2

## 2019-08-19 MED ORDER — IPRATROPIUM-ALBUTEROL 0.5-2.5 (3) MG/3ML IN SOLN: 3.0000 mL | RESPIRATORY_TRACT | Status: DC | PRN

## 2019-08-19 MED ORDER — CHLORHEXIDINE GLUCONATE 0.12% ORAL RINSE (MEDLINE KIT)
15.0000 mL | Freq: Two times a day (BID) | OROMUCOSAL | Status: DC
  Administered 2019-08-19: 15 mL via OROMUCOSAL

## 2019-08-19 NOTE — Progress Notes (Signed)
PT Cancellation Note  Patient Details Name: Alex [name] MRN: [id number] DOB: [birth year]   Cancelled Treatment:    Reason Eval/Treat Not Completed: Other (comment)  Pt too lethargic to participate.  He was arousable but then would fall asleep mid-sentence.  Will f/u at later date. Anise Salvo, PT Acute Rehab Services Pager [phone] [facility] [phone]   Rayetta Humphrey 08/19/2019, 4:20 PM

## 2019-08-19 NOTE — Progress Notes (Signed)
NUTRITION NOTE  Patient screened for new vent this AM. Patient discussed in rounds. Patient now extubated. Plan to advance diet later today if patient's mentation is adequate.   Per chart review, weight today is 112 lb and most recently documented weight PTA was on 01/06/19 when he weighed 119 lb. This indicates 7 lb weight loss (6% body weight) which is not significant for time frame.   Will not follow patient, but if nutrition-related needs or concerns arise, please consult RD.      Trenton Gammon, MS, RD, LDN, CNSC Inpatient Clinical Dietitian RD pager # available in [facility]  After hours/weekend pager # available in [facility]

## 2019-08-19 NOTE — H&P (Signed)
NAME:  Alex [name], MRN:  [id number], DOB:  [birth year], LOS: 0 ADMISSION DATE:  08/18/2019, CONSULTATION DATE:  08/17/19 REFERRING MD: Rondel Oh, CHIEF COMPLAINT:  Altered mental status  Brief History   34 year old man with hx of endocarditis, heroin use, hx etoh use, here from jail with Agitation, ams, opiate withdrawal.    History of present illness   Brought by EMS from jail.  Had been found unconscious, given 12 mg (?) of narcan.   Patient reported he is withdrawing from heroin and etoh.  Was agitated and spitting.  Reported to ED taht last heroin was 6/7 am.   Denied much recent etoh however.  Had received ativan 1mg  x 2,  geodon 10mg  IM x 2 Intubated at around 11pm, etomidate and rocuronium.     Started Clonidine   Started on Propofol.   Positive amphetamines, benzos on Utox.  Past Medical History  Adhd Asthma Depression etoh abuse Hx SI Endocarditis, tricuspid Heroin abuse Tobacco abuse, 1 ppd x 10y Significant Hospital Events   Intubated 6/7  Consults:    Procedures:    Significant Diagnostic Tests:  CT head: negative CXR normal Micro Data:  Blood cx p  Antimicrobials:    Interim history/subjective:    Objective   Blood pressure (!) 143/125, pulse 72, temperature 98.7 F (37.1 C), resp. rate 19, height 5\' 8"  (1.727 m), weight 54 kg, SpO2 100 %.    Vent Mode: PRVC FiO2 (%):  [50 %-100 %] 50 % Set Rate:  [16 bmp] 16 bmp Vt Set:  [500 mL] 500 mL PEEP:  [5 cmH20] 5 cmH20 Plateau Pressure:  [10 TDD22-02 cmH20] 12 cmH20  No intake or output data in the 24 hours ending 08/19/19 0216 Filed Weights   08/18/19 2258  Weight: 54 kg    Examination: General: intubated, intermittent agitation HENT: purrl, mmm Lungs: CTAB, no wheeze Cardiovascular: RRR no mgr audible Abdomen: nt, nbs Extremities: no edema, multiple superficial lesions on lower legs Neuro: not oriented or appropriate, intermittent agitation, not following commands  Resolved Hospital  Problem list     Assessment & Plan:  Agitation: opiate withdrawal.  Amphetamine positive.   Intubated for airway protection.  Remains somewhat agitated on propofol.  Trial of fentanyl.   Consider transitioning to benzo if propofol still remains inadequate.  No other obvious causes of agitation/delirium.   Labs fairly normal.     Best practice:  Diet: npo Pain/Anxiety/Delirium protocol (if indicated): fentanyl, propofol VAP protocol (if indicated):  DVT prophylaxis: lovenox GI prophylaxis: ppi Glucose control:  Mobility: bed Code Status:Full Family Communication:  Disposition:   Labs   CBC: Recent Labs  Lab 08/18/19 2150  WBC 9.1  NEUTROABS 5.8  HGB 12.9*  HCT 38.9*  MCV 84.6  PLT 542    Basic Metabolic Panel: Recent Labs  Lab 08/18/19 2150  NA 142  K 3.5  CL 108  CO2 27  GLUCOSE 114*  BUN 15  CREATININE 0.81  CALCIUM 9.0   GFR: Estimated Creatinine Clearance: 99.1 mL/min (by C-G formula based on SCr of 0.81 mg/dL). Recent Labs  Lab 08/18/19 2150 08/19/19 0012  WBC 9.1  --   LATICACIDVEN  --  1.4    Liver Function Tests: Recent Labs  Lab 08/18/19 2150  AST 49*  ALT 96*  ALKPHOS 113  BILITOT 0.7  PROT 7.2  ALBUMIN 4.0   No results for input(s): LIPASE, AMYLASE in the last 168 hours. No results for input(s): AMMONIA in the  last 168 hours.  ABG    Component Value Date/Time   PHART 7.472 (H) 08/18/2019 2315   PCO2ART 30.4 (L) 08/18/2019 2315   PO2ART 493 (H) 08/18/2019 2315   HCO3 22.0 08/18/2019 2315   ACIDBASEDEF 0.5 08/18/2019 2315   O2SAT VALUE ABOVE REPORTABLE RANGE >100.0 08/18/2019 2315     Coagulation Profile: No results for input(s): INR, PROTIME in the last 168 hours.  Cardiac Enzymes: Recent Labs  Lab 08/18/19 2150  CKTOTAL 198    HbA1C: Hgb A1c MFr Bld  Date/Time Value Ref Range Status  01/01/2019 03:42 AM 6.1 (H) 4.8 - 5.6 % Final    Comment:    (NOTE) Pre diabetes:          5.7%-6.4% Diabetes:               >6.4% Glycemic control for   <7.0% adults with diabetes     CBG: Recent Labs  Lab 08/18/19 1644  GLUCAP 72    Review of Systems:   Unable to assess  Past Medical History  He,  has a past medical history of ADHD (attention deficit hyperactivity disorder), Allergy, Asthma, Depressed, ETOH abuse, and History of suicidal tendencies.   Surgical History    Past Surgical History:  Procedure Laterality Date  . TEE WITHOUT CARDIOVERSION N/A 12/31/2018   Procedure: TRANSESOPHAGEAL ECHOCARDIOGRAM (TEE);  Surgeon: Thurmon Fair, MD;  Location: [facility] ENDOSCOPY;  Service: Cardiovascular;  Laterality: N/A;     Social History   reports that he has been smoking cigarettes. He has a 10.00 pack-year smoking history. He has never used smokeless tobacco. He reports current alcohol use of about 12.0 standard drinks of alcohol per week. He reports current drug use. Frequency: 6.00 times per week. Drugs: Marijuana, Cocaine, Heroin, and Fentanyl.   Family History   His family history includes COPD in an other family member.   Allergies No Known Allergies   Home Medications  Prior to Admission medications   Medication Sig Start Date End Date Taking? Authorizing Provider  albuterol (PROVENTIL HFA) 108 (90 Base) MCG/ACT inhaler Inhale 1-2 puffs into the lungs every 4 (four) hours as needed for wheezing or shortness of breath. 09/08/18   Wieters, Hallie C, PA-C  albuterol (PROVENTIL) (2.5 MG/3ML) 0.083% nebulizer solution Take 3 mLs (2.5 mg total) by nebulization every 6 (six) hours as needed for wheezing or shortness of breath. 09/08/18   Wieters, Hallie C, PA-C  hydroxypropyl methylcellulose / hypromellose (ISOPTO TEARS / GONIOVISC) 2.5 % ophthalmic solution Place 1 drop into both eyes 3 (three) times daily as needed for dry eyes.    [provider]  naproxen sodium (ALEVE) 220 MG tablet Take 440 mg by mouth 2 (two) times daily as needed (pain).    [provider]    ipratropium-albuterol (DUONEB) 0.5-2.5 (3) MG/3ML SOLN Take 3 mLs by nebulization every 6 (six) hours as needed (wheezing). Patient not taking: Reported on 12/28/2018 04/14/18 12/28/18  Elpidio Anis, PA-C  montelukast (SINGULAIR) 10 MG tablet Take 1 tablet (10 mg total) by mouth at bedtime. Patient not taking: Reported on 12/28/2018 07/25/17 12/28/18  Georgetta Haber, NP  traZODone (DESYREL) 50 MG tablet Take 1 tablet (50 mg total) by mouth at bedtime and may repeat dose one time if needed. Patient not taking: Reported on 07/05/2018 04/29/16 12/28/18  Oneta Rack, NP     Critical care time: 60 min

## 2019-08-19 NOTE — Evaluation (Signed)
Clinical/Bedside Swallow Evaluation Patient Details  Name: Alex [name] MRN: [id number] Date of Birth: 12/22/1985  Today's Date: 08/19/2019 Time: SLP Start Time (ACUTE ONLY): 1410 SLP Stop Time (ACUTE ONLY): 1446 SLP Time Calculation (min) (ACUTE ONLY): 36 min  Past Medical History:  Past Medical History:  Diagnosis Date  . ADHD (attention deficit hyperactivity disorder)   . Allergy   . Asthma   . Depressed   . ETOH abuse   . History of suicidal tendencies    Past Surgical History:  Past Surgical History:  Procedure Laterality Date  . TEE WITHOUT CARDIOVERSION N/A 12/31/2018   Procedure: TRANSESOPHAGEAL ECHOCARDIOGRAM (TEE);  Surgeon: Sanda Klein, MD;  Location: [facility] ENDOSCOPY;  Service: Cardiovascular;  Laterality: N/A;   HPI:  34 yo male adm from jail after found unconcious, Pt with opiate w/d, possibly received Narcan and required intubation for airway protection on 6/8. He was extubated 6/9 am.  Swallow eval ordered.  Pt CT head showed secretions in posterior nasopharynx and post ethmoids.  Pt also with h/o ETOH and tobacco use, asthma, and ADHD. Swallow evaluation ordered.   Assessment / Plan / Recommendation Clinical Impression  Pt is sleepy but participative as he desires to eat.  Pt with hoarse voice and reports odynophagia with swallowing - acute due to intubation.  Note blood tinged abrasion on soft palate to left of uvula.  Pt observed consuming water, juice, ice cream and graham cracker.  Mild dysphagia and odynophagia due to his intubation expected to quickly resolve.  Strong cough x1 noted with last bolus of thin water resulting in productive cough to green tinged secretions.  Due to odynophagia - dys3 diet started and advised pt avoid acidic, spicy and hot items until edema and dysphagia resolves. Education with pt completed and anticipate his acute dysphagia to resolve quickly. No SLP follow up needed. SLP Visit Diagnosis: Dysphagia, unspecified (R13.10)     Aspiration Risk  Mild aspiration risk    Diet Recommendation Dysphagia 3 (Mech soft);Thin liquid(no acidic, spicy, hot temperature items)   Liquid Administration via: Cup;Straw Medication Administration: Whole meds with puree Supervision: Patient able to self feed Compensations: Slow rate;Small sips/bites Postural Changes: Seated upright at 90 degrees;Remain upright for at least 30 minutes after po intake    Other  Recommendations Oral Care Recommendations: Oral care BID   Follow up Recommendations None      Frequency and Duration     n/a       Prognosis   n/a     Swallow Study   General Date of Onset: 08/19/19 HPI: 34 yo male adm from jail after found unconcious, Pt with opiate w/d, possibly received Narcan and required intubation for airway protection on 6/8. He was extubated 6/9 am.  Swallow eval ordered.  Pt CT head showed secretions in posterior nasopharynx and post ethmoids.  Pt also with h/o ETOH and tobacco use, asthma, and ADHD. Swallow evaluation ordered. Type of Study: Bedside Swallow Evaluation Diet Prior to this Study: NPO Temperature Spikes Noted: No Respiratory Status: Nasal cannula History of Recent Intubation: Yes Length of Intubations (days): 2 days Date extubated: 08/19/19 Behavior/Cognition: Alert;Cooperative;Requires cueing Oral Cavity Assessment: Erythema;Other (comment)(pt appears with abrasion near soft palate/uvula resulting in pain with swallowing) Oral Care Completed by SLP: No Oral Cavity - Dentition: Adequate natural dentition Vision: Functional for self-feeding Self-Feeding Abilities: Able to feed self;Needs set up Patient Positioning: Upright in bed Baseline Vocal Quality: Low vocal intensity Volitional Cough: Strong(productive to viscous secretions) Volitional Swallow: Unable  to elicit    Oral/Motor/Sensory Function Overall Oral Motor/Sensory Function: Generalized oral weakness(minimal generalized weakness)   Ice Chips Ice chips: Within  functional limits   Thin Liquid Thin Liquid: Within functional limits Presentation: Cup;Straw;Spoon;Self Fed Other Comments: cough x1 of 7 boluses with final bolus of evaluation; cough was productive to yellow/green tinged secretions    Nectar Thick Nectar Thick Liquid: Not tested   Honey Thick Honey Thick Liquid: Not tested   Puree Puree: Within functional limits Presentation: Self Fed;Spoon   Solid     Solid: Within functional limits Presentation: Self Fed;Spoon      Chales Abrahams 08/19/2019,3:01 PM   Rolena Infante, MS [facility] SLP Acute Rehab Services Office [phone]

## 2019-08-19 NOTE — Progress Notes (Signed)
   08/19/19 1706  Assess: MEWS Score  Temp 98.7 F (37.1 C)  BP 98/61  Pulse Rate 73  Resp 16  Level of Consciousness Responds to Pain  SpO2 100 %  O2 Device Room Air  Assess: MEWS Score  MEWS Temp 0  MEWS Systolic 1  MEWS Pulse 0  MEWS RR 0  MEWS LOC 2  MEWS Score 3  MEWS Score Color Yellow  Assess: if the MEWS score is Yellow or Red  Were vital signs taken at a resting state? Yes  Focused Assessment Documented focused assessment  Early Detection of Sepsis Score *See Row Information* Low  MEWS guidelines implemented *See Row Information* Yes  Treat  MEWS Interventions Administered scheduled meds/treatments  Take Vital Signs  Increase Vital Sign Frequency  Yellow: Q 2hr X 2 then Q 4hr X 2, if remains yellow, continue Q 4hrs  Escalate  MEWS: Escalate Yellow: discuss with charge nurse/RN and consider discussing with provider and RRT  Notify: Charge Nurse/RN  Name of Charge Nurse/RN Notified Consuello Closs RN  Date Charge Nurse/RN Notified 08/19/19  Time Charge Nurse/RN Notified 1717  Document  Patient Outcome Other (Comment) (pt's LOC is unchanged from previous status prior to transfer)  Progress note created (see row info) Yes  MEWS Guidelines - (patients age 39 and over)    Pt admitted to unit. Decreased LOC and hypotension triggered yellow MEWS protocol. LOC unchanged per RN who was with pt at time of transfer. Pt arouseable but drowsy. Will implement yellow MEWS protocol and continue to monitor. Pt in no acute distress. Guard with pt at bedside.

## 2019-08-19 NOTE — Progress Notes (Signed)
NAME:  Alex [name], MRN:  [id number], DOB:  1986/02/15, LOS: 0 ADMISSION DATE:  08/18/2019, CONSULTATION DATE:  6/9 REFERRING MD:  Erskin Burnet, CHIEF COMPLAINT:  Confusion   Brief History   34 y/o male with a history of endocarditis and heroin use was admitted from jail with agitation and opiate withdrawal which required intubation for airway protection.  Past Medical History  Adhd Asthma Depression etoh abuse Hx SI Endocarditis, tricuspid Heroin abuse Tobacco abuse, 1 ppd x 10y  Significant Hospital Events   Admitted 6/8, intubated late taht evening.  Consults:    Procedures:  6/9 ETT >   Significant Diagnostic Tests:  6/8 CT head > NAICP, secretions in posterior naspopharynx and post ethmoids  Micro Data:  6/9 sar cov2 > neg 6/9 blood culture >   Antimicrobials:    Interim history/subjective:  Passing SBT  Objective   Blood pressure 107/66, pulse 62, temperature 97.7 F (36.5 C), temperature source Oral, resp. rate 16, height 5\' 8"  (1.727 m), weight 50.8 kg, SpO2 100 %.    Vent Mode: PRVC FiO2 (%):  [40 %-100 %] 40 % Set Rate:  [16 bmp] 16 bmp Vt Set:  [500 mL-540 mL] 540 mL PEEP:  [5 cmH20] 5 cmH20 Plateau Pressure:  [10 cmH20-13 cmH20] 13 cmH20   Intake/Output Summary (Last 24 hours) at 08/19/2019 0854 Last data filed at 08/19/2019 0657 Gross per 24 hour  Intake 264.99 ml  Output --  Net 264.99 ml   Filed Weights   08/18/19 2258 08/19/19 0500 08/19/19 0655  Weight: 54 kg 50.8 kg 50.8 kg    Examination:  General:  In bed on vent HENT: NCAT ETT in place PULM: CTA B, vent supported breathing CV: RRR, no mgr GI: BS+, soft, nontender MSK: normal bulk and tone Neuro: sedated on vent    6/9 CXR > no infiltrates, rotated film, scoliosis? Personally reviewed  Resolved Hospital Problem list     Assessment & Plan:  Acute respiratory failure with hypoxemia due to inability to protect airway from sedative medications > extubate this morning >  aspiration precautions  Narcotic and possibly EtOH withdrawal > stop fentanyl and propofol infusions > use precedex, versed and haldol prn agitation > continue clonidine for narcotic withdrawal protocol > thiamine, folate  Best practice:  Diet: advance after extubation Pain/Anxiety/Delirium protocol (if indicated): as above VAP protocol (if indicated): yes DVT prophylaxis: lovenox GI prophylaxis: d/c pantoprazole Glucose control: monitor Mobility: out of bed after extubation Code Status: full Family Communication: none bedside Disposition: ICU  Labs   CBC: Recent Labs  Lab 08/18/19 2150  WBC 9.1  NEUTROABS 5.8  HGB 12.9*  HCT 38.9*  MCV 84.6  PLT 290    Basic Metabolic Panel: Recent Labs  Lab 08/18/19 2150  NA 142  K 3.5  CL 108  CO2 27  GLUCOSE 114*  BUN 15  CREATININE 0.81  CALCIUM 9.0   GFR: Estimated Creatinine Clearance: 93.2 mL/min (by C-G formula based on SCr of 0.81 mg/dL). Recent Labs  Lab 08/18/19 2150 08/19/19 0012 08/19/19 0457  WBC 9.1  --   --   LATICACIDVEN  --  1.4 0.9    Liver Function Tests: Recent Labs  Lab 08/18/19 2150  AST 49*  ALT 96*  ALKPHOS 113  BILITOT 0.7  PROT 7.2  ALBUMIN 4.0   No results for input(s): LIPASE, AMYLASE in the last 168 hours. No results for input(s): AMMONIA in the last 168 hours.  ABG  Component Value Date/Time   PHART 7.472 (H) 08/18/2019 2315   PCO2ART 30.4 (L) 08/18/2019 2315   PO2ART 493 (H) 08/18/2019 2315   HCO3 22.0 08/18/2019 2315   ACIDBASEDEF 0.5 08/18/2019 2315   O2SAT VALUE ABOVE REPORTABLE RANGE >100.0 08/18/2019 2315     Coagulation Profile: No results for input(s): INR, PROTIME in the last 168 hours.  Cardiac Enzymes: Recent Labs  Lab 08/18/19 2150  CKTOTAL 198    HbA1C: Hgb A1c MFr Bld  Date/Time Value Ref Range Status  01/01/2019 03:42 AM 6.1 (H) 4.8 - 5.6 % Final    Comment:    (NOTE) Pre diabetes:          5.7%-6.4% Diabetes:               >6.4% Glycemic control for   <7.0% adults with diabetes     CBG: Recent Labs  Lab 08/18/19 [place]    Critical care time: 31 minutes    Roselie Awkward, MD [facility] Pager: [phone] Cell: [phone] If no response, call [phone]

## 2019-08-19 NOTE — Progress Notes (Signed)
Pt transported from ED to ICU 1222 on vent at 100% fio2.  Pt tolerated transport well without incident.

## 2019-08-19 NOTE — ED Provider Notes (Signed)
.  Critical Care Performed by: Sabas Sous, MD Authorized by: Sabas Sous, MD   Critical care provider statement:    Critical care time (minutes):  34   Critical care was necessary to treat or prevent imminent or life-threatening deterioration of the following conditions:  CNS failure or compromise   Critical care was time spent personally by me on the following activities:  Discussions with consultants, evaluation of patient's response to treatment, examination of patient, ordering and performing treatments and interventions, ordering and review of laboratory studies, ordering and review of radiographic studies, pulse oximetry, re-evaluation of patient's condition, obtaining history from patient or surrogate and review of old charts      Sabas Sous, MD 08/19/19 2323

## 2019-08-19 NOTE — Progress Notes (Signed)
150 cc IV fentanyl wasted from bag, 25 cc IV versed wasted from bag with Tanda Rockers RN

## 2019-08-19 NOTE — Evaluation (Signed)
SLP Cancellation Note  Patient Details Name: Alex [name] MRN: [id number] DOB: [birth year]   Cancelled treatment:       Reason Eval/Treat Not Completed: Other (comment)(pt currently lethargic, has been extubated, asked RN to secure chat if he awakens adequately for po)  Rolena Infante, MS [facility] SLP Acute Rehab Services Office [phone]  Chales Abrahams 08/19/2019, 11:33 AM

## 2019-08-20 ENCOUNTER — Encounter (HOSPITAL_COMMUNITY): Payer: Self-pay | Admitting: Pulmonary Disease

## 2019-08-20 ENCOUNTER — Inpatient Hospital Stay (HOSPITAL_COMMUNITY)

## 2019-08-20 DIAGNOSIS — R451 Restlessness and agitation: Secondary | ICD-10-CM

## 2019-08-20 LAB — SEDIMENTATION RATE: Sed Rate: 7 mm/hr (ref 0–16)

## 2019-08-20 LAB — CBC WITH DIFFERENTIAL/PLATELET
Abs Immature Granulocytes: 0.03 10*3/uL (ref 0.00–0.07)
Basophils Absolute: 0 10*3/uL (ref 0.0–0.1)
Basophils Relative: 1 %
Eosinophils Absolute: 0.1 10*3/uL (ref 0.0–0.5)
Eosinophils Relative: 1 %
HCT: 32.1 % — ABNORMAL LOW (ref 39.0–52.0)
Hemoglobin: 10.9 g/dL — ABNORMAL LOW (ref 13.0–17.0)
Immature Granulocytes: 0 %
Lymphocytes Relative: 42 %
Lymphs Abs: 3.1 10*3/uL (ref 0.7–4.0)
MCH: 29.5 pg (ref 26.0–34.0)
MCHC: 34 g/dL (ref 30.0–36.0)
MCV: 87 fL (ref 80.0–100.0)
Monocytes Absolute: 0.8 10*3/uL (ref 0.1–1.0)
Monocytes Relative: 10 %
Neutro Abs: 3.5 10*3/uL (ref 1.7–7.7)
Neutrophils Relative %: 46 %
Platelets: 225 10*3/uL (ref 150–400)
RBC: 3.69 MIL/uL — ABNORMAL LOW (ref 4.22–5.81)
RDW: 14.3 % (ref 11.5–15.5)
WBC: 7.5 10*3/uL (ref 4.0–10.5)
nRBC: 0 % (ref 0.0–0.2)

## 2019-08-20 LAB — BASIC METABOLIC PANEL
Anion gap: 7 (ref 5–15)
BUN: 14 mg/dL (ref 6–20)
CO2: 24 mmol/L (ref 22–32)
Calcium: 8.5 mg/dL — ABNORMAL LOW (ref 8.9–10.3)
Chloride: 107 mmol/L (ref 98–111)
Creatinine, Ser: 0.58 mg/dL — ABNORMAL LOW (ref 0.61–1.24)
GFR calc Af Amer: >60 mL/min (ref >60)
GFR calc non Af Amer: >60 mL/min (ref >60)
Glucose, Bld: 98 mg/dL (ref 70–99)
Potassium: 3.7 mmol/L (ref 3.5–5.1)
Sodium: 138 mmol/L (ref 135–145)

## 2019-08-20 LAB — C-REACTIVE PROTEIN: CRP: 2 mg/dL — ABNORMAL HIGH (ref <1.0)

## 2019-08-20 MED ORDER — THIAMINE HCL 100 MG PO TABS: 100.0000 mg | ORAL_TABLET | Freq: Every day | ORAL | Status: DC

## 2019-08-20 MED ORDER — CLONIDINE HCL 0.1 MG PO TABS: 0.1000 mg | ORAL_TABLET | ORAL | 11 refills | Status: DC

## 2019-08-20 MED ORDER — CLONIDINE HCL 0.1 MG PO TABS: 0.1000 mg | ORAL_TABLET | Freq: Every day | ORAL | 11 refills | Status: DC

## 2019-08-20 MED ORDER — FOLIC ACID 1 MG PO TABS: 1.0000 mg | ORAL_TABLET | Freq: Every day | ORAL | Status: DC

## 2019-08-20 MED ORDER — ADULT MULTIVITAMIN W/MINERALS CH
1.0000 | ORAL_TABLET | Freq: Every day | ORAL | Status: DC
  Administered 2019-08-20: 1 via ORAL
  Filled 2019-08-20: qty 1

## 2019-08-20 MED ORDER — HYDROXYZINE HCL 25 MG PO TABS: 25.0000 mg | ORAL_TABLET | Freq: Four times a day (QID) | ORAL | 0 refills | Status: DC | PRN

## 2019-08-20 NOTE — Discharge Instructions (Signed)
Can use clonidine for narcotic withdrawal protocol

## 2019-08-20 NOTE — Evaluation (Signed)
Physical Therapy Evaluation Patient Details Name: Alex [name] MRN: [id number] DOB: 05/02/1985 Today's Date: 08/20/2019   History of Present Illness  Patient is 34 y.o. male with a history of endocarditis, asthma, ADHD, depression, ETOH abuse, and heroin use was found unconscious and required narcan administration at jail. Patient transported via EMS and admitted on 6/8/21with agitation and opiate withdrawal which required intubation for airway protection. Patient extubated on 08/19/19.     Clinical Impression  Patient evaluated by Physical Therapy with no further acute PT needs identified. All education has been completed and the patient has no further questions. Patient is functioning at his baseline for independence with mobility. His gait pattern was limited by ankle restraints which were requested to be removed by therapist and declined by guard. Patient was happy to mobilize. See below for any follow-up Physical Therapy or equipment needs. PT is signing off. Thank you for this referral.     Follow Up Recommendations No PT follow up    Equipment Recommendations  None recommended by PT    Recommendations for Other Services       Precautions / Restrictions Precautions Precautions: Fall (due to restraints "shackles") Restrictions Weight Bearing Restrictions: No      Mobility  Bed Mobility Overal bed mobility: Modified Independent       Transfers Overall transfer level: Modified independent Equipment used: None       Ambulation/Gait Ambulation/Gait assistance: Supervision;Min assist Gait Distance (Feet): 360 Feet Assistive device: None Gait Pattern/deviations: Step-through pattern;Decreased stride length;Decreased step length - left;Decreased step length - right Gait velocity: fair   General Gait Details: supervision majority of gait, min assist on 3 occasions due to slight LOB from ankle shackles catching him or shortening his step length.  Stairs        Wheelchair Mobility    Modified Rankin (Stroke Patients Only)       Balance Overall balance assessment: Mild deficits observed, not formally tested            Pertinent Vitals/Pain Pain Assessment: Faces Faces Pain Scale: Hurts a little bit Pain Location: low back Pain Descriptors / Indicators: Aching;Discomfort Pain Intervention(s): Limited activity within patient's tolerance;Monitored during session    Home Living Family/patient expects to be discharged to:: [facility]         Prior Function Level of Independence: Independent           Hand Dominance   Dominant Hand: Right    Extremity/Trunk Assessment   Upper Extremity Assessment Upper Extremity Assessment: Defer to OT evaluation    Lower Extremity Assessment Lower Extremity Assessment: Overall WFL for tasks assessed    Cervical / Trunk Assessment Cervical / Trunk Assessment: Normal  Communication   Communication: No difficulties  Cognition Arousal/Alertness: Awake/alert Behavior During Therapy: WFL for tasks assessed/performed Overall Cognitive Status: Within Functional Limits for tasks assessed           General Comments      Exercises     Assessment/Plan    PT Assessment Patent does not need any further PT services  PT Problem List         PT Treatment Interventions      PT Goals (Current goals can be found in the Care Plan section)  Acute Rehab PT Goals Patient Stated Goal: none stated PT Goal Formulation: All assessment and education complete, DC therapy Time For Goal Achievement: 08/20/19 Potential to Achieve Goals: Good    Frequency     Barriers to discharge  Co-evaluation PT/OT/SLP Co-Evaluation/Treatment: Yes Reason for Co-Treatment: For patient/therapist safety;To address functional/ADL transfers           AM-PAC PT "6 Clicks" Mobility  Outcome Measure Help needed turning from your back to your side while in a flat bed without using bedrails?:  None Help needed moving from lying on your back to sitting on the side of a flat bed without using bedrails?: None Help needed moving to and from a bed to a chair (including a wheelchair)?: None Help needed standing up from a chair using your arms (e.g., wheelchair or bedside chair)?: None Help needed to walk in hospital room?: A Little Help needed climbing 3-5 steps with a railing? : A Little 6 Click Score: 22    End of Session Equipment Utilized During Treatment: Gait belt Activity Tolerance: Patient tolerated treatment well Patient left: in bed;with call bell/phone within reach (with officer/guard in room) Nurse Communication: Mobility status PT Visit Diagnosis: Unsteadiness on feet (R26.81)    Time: 2244-9753 PT Time Calculation (min) (ACUTE ONLY): 17 min   Charges:   PT Evaluation $PT Eval Low Complexity: 1 Low          Wynn Maudlin, DPT [facility] Acute Rehabilitation Services  Office [phone] Pager [phone]  08/20/2019 2:58 PM

## 2019-08-20 NOTE — Discharge Summary (Signed)
Physician Discharge Summary  Alex [name] EXN:[id number] DOB: [birth year]   PCP: Patient, No Pcp Per  Admit date: 08/18/2019 Discharge date: 08/20/2019 Length of Stay: 1 days   Code Status: Full Code  Admitted From: [place] Discharged to: [facility]: none  Equipment/Devices:  None Discharge Condition:  Stable  Recommendations for Outpatient Follow-up   1. Encourage cessation of illicit substances 2. Clonidine taper at discharge per narcotic withdrawal protocol  Hospital Summary  This is a 34 year old male with history of endocarditis, IV drug use with heroin, alcohol use who presented from jail with agitation, altered mental status and opiate withdrawal after being brought in by EMS from prison.  He was found unconscious and given questionably 12 mg of Narcan.  Reported he was withdrawing from heroin and alcohol on presentation.  He reported to the ED that his last heroin use was 6/7 AM but denied any recent alcohol use.  He received Ativan 1 mg x 2 and Geodon 10 mg IM x2 in the ED.  On 6/8 in the ED, decision was made to intubate for airway protection.  Patient was admitted to [facility] service and started on clonidine and propofol.  UDS positive for amphetamines and benzos.  Patient was successfully extubated on 6/9.  Transferred to hospitalist service on 6/10 and remained stable on room air without any further altered mental status or agitation.  He reported thoracolumbar pain and had point tenderness on exam with negative ESR and minimally elevated CRP.  Blood cultures no growth to date.  Thoracolumbar x-ray unremarkable for any fractures.  Neurologic exam unremarkable and therefore given these findings, and MRI was not ordered to rule out discitis.  Patient was discharged in stable condition on 6/10 to jail with addition of clonidine taper per narcotic withdrawal protocol.  Can use hydroxyzine as needed  A & P   Active Problems:   Agitation  1. Agitation secondary to opiate  withdrawal 1. Intubated for airway protection on 6/8-> extubated 6/9 2. Currently hemodynamically stable without any further agitation 3. Hydroxyzine at discharge as needed 4. Can consider adding on benzo taper if needed  5. Cleared by SLP and PT 2. Acute hypoxic respiratory failure due to inability to protect airway from sedative medications resolved 3. Narcotic and possibly alcohol withdrawal 1. Fentanyl and propofol infusions discontinued  2. Discharged with clonidine taper at discharge 4. Thoracolumbar back pain, likely musculoskeletal 1. In setting of IV drug user initial concern for discitis however negative blood cultures, negative sed rate and borderline elevated CRP without new onset neurologic issues or fever indicating this is less likely discitis.  Would get an MRI if symptoms worsen or develops fever or develops neurologic issues 2. Continue outpatient NSAIDs as needed   Consultants  . PCCM  Procedures  Intubated for airway protection on 6/8-> extubated 6/9  Antibiotics   Anti-infectives (From admission, onward)   None       Subjective   Seen and examined at bedside no acute distress resting comfortably with guard present.  Admits to low back pain with chronic paresthesias of his toes but no new onset neurologic issue.  Does not completely recall the events that led up to his hospitalization or the events throughout his hospitalization.  He is a poor historian.  No other issues.   Objective   Discharge Exam: Vitals:   08/19/19 2026 08/20/19 0522  BP: 98/62 (!) 96/59  Pulse: 73 69  Resp: 20 15  Temp: 98.2 F (36.8 C) 98.1 F (  36.7 C)  SpO2: 100% 99%   Vitals:   08/19/19 1600 08/19/19 1706 08/19/19 2026 08/20/19 0522  BP: 1_0 (!) 96/59  Pulse: 74 73 73 69  Resp: _1 Temp: 99.4 F (37.4 C) 98.7 F (37.1 C) 98.2 F (36.8 C) 98.1 F (36.7 C)  TempSrc: Oral Oral Oral Oral  SpO2: 100% 100% 100% 99%  Weight:  52 kg  54.4 kg   Height:  _2  (1.727 m)      Physical Exam Vitals and nursing note reviewed. Exam conducted with a chaperone present.  Constitutional:      Appearance: Normal appearance. He is not ill-appearing or toxic-appearing.     Comments: Poor hygiene  HENT:     Head: Normocephalic and atraumatic.  Eyes:     Conjunctiva/sclera: Conjunctivae normal.  Cardiovascular:     Rate and Rhythm: Normal rate and regular rhythm.     Heart sounds: No murmur heard.   Pulmonary:     Effort: Pulmonary effort is normal.     Breath sounds: Normal breath sounds.  Abdominal:     General: Abdomen is flat.     Palpations: Abdomen is soft.  Musculoskeletal:        General: No swelling or tenderness.  Skin:    Coloration: Skin is not jaundiced or pale.  Neurological:     Mental Status: He is alert. Mental status is at baseline.     Deep Tendon Reflexes: Reflexes normal.  Psychiatric:        Mood and Affect: Mood normal.        Behavior: Behavior normal.       The results of significant diagnostics from this hospitalization (including imaging, microbiology, ancillary and laboratory) are listed below for reference.     Microbiology: Recent Results (from the past 240 hour(s))  Blood culture (routine x 2)     Status: None (Preliminary result)   Collection Time: 08/19/19 12:12 AM   Specimen: BLOOD  Result Value Ref Range Status   Specimen Description   Final    BLOOD LEFT ANTECUBITAL Performed at [facility] [address], [place], [place] [address]    Special Requests   Final    BOTTLES DRAWN AEROBIC AND ANAEROBIC Blood Culture adequate volume Performed at [facility] [address] [place], [place] [address]    Culture   Final    NO GROWTH 1 DAY Performed at [facility], [place] [address], [place], [place] [address]    Report Status PENDING  Incomplete  SARS Coronavirus 2 by RT PCR (hospital order, performed in [facility] hospital lab)  Nasopharyngeal Nasopharyngeal Swab     Status: None   Collection Time: 08/19/19  1:30 AM   Specimen: Nasopharyngeal Swab  Result Value Ref Range Status   SARS Coronavirus 2 NEGATIVE NEGATIVE Final    Comment: (NOTE) SARS-CoV-2 target nucleic acids are NOT DETECTED. The SARS-CoV-2 RNA is generally detectable in upper and lower respiratory specimens during the acute phase of infection. The lowest concentration of SARS-CoV-2 viral copies this assay can detect is 250 copies / mL. A negative result does not preclude SARS-CoV-2 infection and should not be used as the sole basis for treatment or other patient management decisions.  A negative result may occur with improper specimen collection / handling, submission of specimen other than nasopharyngeal swab, presence of viral mutation(s) within the areas targeted by this assay, and inadequate number of viral  copies (<250 copies / mL). A negative result must be combined with clinical observations, patient history, and epidemiological information. Fact Sheet for Patients:   StrictlyIdeas.no Fact Sheet for Healthcare Providers: BankingDealers.co.za This test is not yet approved or cleared  by the [place] FDA and has been authorized for detection and/or diagnosis of SARS-CoV-2 by FDA under an Emergency Use Authorization (EUA).  This EUA will remain in effect (meaning this test can be used) for the duration of the COVID-19 declaration under Section 564(b)(1) of the Act, 21 U.S.C. section 360bbb-3(b)(1), unless the authorization is terminated or revoked sooner. Performed at [facility], [place] [address], [place], [place] [address]   MRSA PCR Screening     Status: Abnormal   Collection Time: 08/19/19  5:53 AM   Specimen: Nasal Mucosa; Nasopharyngeal  Result Value Ref Range Status   MRSA by PCR POSITIVE (A) NEGATIVE Final    Comment:        The GeneXpert MRSA Assay  (FDA approved for NASAL specimens only), is one component of a comprehensive MRSA colonization surveillance program. It is not intended to diagnose MRSA infection nor to guide or monitor treatment for MRSA infections. RESULT CALLED TO, READ BACK BY AND VERIFIED WITH: CAGLE, C.  _0  ON 6.9.2021 BY [facility] Performed at [facility] [address], [place], [place] [address]      Labs: BNP (last 3 results) No results for input(s): BNP in the last 8760 hours. Basic Metabolic Panel: Recent Labs  Lab 08/18/19 2150 08/20/19 0554  NA 142 138  K 3.5 3.7  CL 108 107  CO2 27 24  GLUCOSE 114* 98  BUN 15 14  CREATININE 0.81 0.58*  CALCIUM 9.0 8.5*   Liver Function Tests: Recent Labs  Lab 08/18/19 2150  AST 49*  ALT 96*  ALKPHOS 113  BILITOT 0.7  PROT 7.2  ALBUMIN 4.0   No results for input(s): LIPASE, AMYLASE in the last 168 hours. No results for input(s): AMMONIA in the last 168 hours. CBC: Recent Labs  Lab 08/18/19 2150 08/20/19 0554  WBC 9.1 7.5  NEUTROABS 5.8 3.5  HGB 12.9* 10.9*  HCT 38.9* 32.1*  MCV 84.6 87.0  PLT 290 225   Cardiac Enzymes: Recent Labs  Lab 08/18/19 2150  CKTOTAL 198   BNP: Invalid input(s): POCBNP CBG: Recent Labs  Lab 08/18/19 1644  GLUCAP 72   D-Dimer No results for input(s): DDIMER in the last 72 hours. Hgb A1c No results for input(s): HGBA1C in the last 72 hours. Lipid Profile No results for input(s): CHOL, HDL, LDLCALC, TRIG, CHOLHDL, LDLDIRECT in the last 72 hours. Thyroid function studies No results for input(s): TSH, T4TOTAL, T3FREE, THYROIDAB in the last 72 hours.  Invalid input(s): FREET3 Anemia work up No results for input(s): VITAMINB12, FOLATE, FERRITIN, TIBC, IRON, RETICCTPCT in the last 72 hours. Urinalysis    Component Value Date/Time   COLORURINE YELLOW 12/28/2018 0536   APPEARANCEUR CLEAR 12/28/2018 0536   LABSPEC 1.010 12/28/2018 0536   PHURINE 6.0 12/28/2018 0536   GLUCOSEU  NEGATIVE 12/28/2018 0536   HGBUR SMALL (A) 12/28/2018 0536   BILIRUBINUR NEGATIVE 12/28/2018 0536   KETONESUR NEGATIVE 12/28/2018 0536   PROTEINUR NEGATIVE 12/28/2018 0536   NITRITE NEGATIVE 12/28/2018 0536   LEUKOCYTESUR NEGATIVE 12/28/2018 0536   Sepsis Labs Invalid input(s): PROCALCITONIN,  WBC,  LACTICIDVEN Microbiology Recent Results (from the past 240 hour(s))  Blood culture (routine x 2)     Status: None (Preliminary result)  Collection Time: 08/19/19 12:12 AM   Specimen: BLOOD  Result Value Ref Range Status   Specimen Description   Final    BLOOD LEFT ANTECUBITAL Performed at [facility] [address] [place], [place] [address]    Special Requests   Final    BOTTLES DRAWN AEROBIC AND ANAEROBIC Blood Culture adequate volume Performed at [facility] [address] [place], [place] [address]    Culture   Final    NO GROWTH 1 DAY Performed at [facility], [place] [address], [place], [place] [address]    Report Status PENDING  Incomplete  SARS Coronavirus 2 by RT PCR (hospital order, performed in [facility] hospital lab) Nasopharyngeal Nasopharyngeal Swab     Status: None   Collection Time: 08/19/19  1:30 AM   Specimen: Nasopharyngeal Swab  Result Value Ref Range Status   SARS Coronavirus 2 NEGATIVE NEGATIVE Final    Comment: (NOTE) SARS-CoV-2 target nucleic acids are NOT DETECTED. The SARS-CoV-2 RNA is generally detectable in upper and lower respiratory specimens during the acute phase of infection. The lowest concentration of SARS-CoV-2 viral copies this assay can detect is 250 copies / mL. A negative result does not preclude SARS-CoV-2 infection and should not be used as the sole basis for treatment or other patient management decisions.  A negative result may occur with improper specimen collection / handling, submission of specimen other than nasopharyngeal swab, presence of viral mutation(s) within the areas  targeted by this assay, and inadequate number of viral copies (<250 copies / mL). A negative result must be combined with clinical observations, patient history, and epidemiological information. Fact Sheet for Patients:   StrictlyIdeas.no Fact Sheet for Healthcare Providers: BankingDealers.co.za This test is not yet approved or cleared  by the [place] FDA and has been authorized for detection and/or diagnosis of SARS-CoV-2 by FDA under an Emergency Use Authorization (EUA).  This EUA will remain in effect (meaning this test can be used) for the duration of the COVID-19 declaration under Section 564(b)(1) of the Act, 21 U.S.C. section 360bbb-3(b)(1), unless the authorization is terminated or revoked sooner. Performed at [facility], [place] [address], [place], [place] [address]   MRSA PCR Screening     Status: Abnormal   Collection Time: 08/19/19  5:53 AM   Specimen: Nasal Mucosa; Nasopharyngeal  Result Value Ref Range Status   MRSA by PCR POSITIVE (A) NEGATIVE Final    Comment:        The GeneXpert MRSA Assay (FDA approved for NASAL specimens only), is one component of a comprehensive MRSA colonization surveillance program. It is not intended to diagnose MRSA infection nor to guide or monitor treatment for MRSA infections. RESULT CALLED TO, READ BACK BY AND VERIFIED WITH: CAGLE, C.  _0  ON 6.9.2021 BY [facility] Performed at [facility] [address] [place], [place] [address]     Discharge Instructions     Discharge Instructions    Diet - low sodium heart healthy   Complete by: As directed    Discharge instructions   Complete by: As directed    - You can take Vistaril as needed every 6 hours for anxiety   Increase activity slowly   Complete by: As directed      Allergies as of 08/20/2019   No Known Allergies     Medication List    TAKE these medications   albuterol 108  (90 Base) MCG/ACT inhaler Commonly known  as: Proventil HFA Inhale 1-2 puffs into the lungs every 4 (four) hours as needed for wheezing or shortness of breath.   albuterol (2.5 MG/3ML) 0.083% nebulizer solution Commonly known as: PROVENTIL Take 3 mLs (2.5 mg total) by nebulization every 6 (six) hours as needed for wheezing or shortness of breath.   hydroxypropyl methylcellulose / hypromellose 2.5 % ophthalmic solution Commonly known as: ISOPTO TEARS / GONIOVISC Place 1 drop into both eyes 3 (three) times daily as needed for dry eyes.   hydrOXYzine 25 MG tablet Commonly known as: ATARAX/VISTARIL Take 1 tablet (25 mg total) by mouth every 6 (six) hours as needed for anxiety.   naproxen sodium 220 MG tablet Commonly known as: ALEVE Take 440 mg by mouth 2 (two) times daily as needed (pain).       No Known Allergies  Dispo: The patient is from: [place]              Anticipated d/c is to: [facility]              Anticipated d/c date is: Today              Patient currently is medically stable to d/c.       Time coordinating discharge: Over 30 minutes   SIGNED:   Harold Hedge, D.O. [facility] Pager: [phone]  08/20/2019, 3:08 PM

## 2019-08-20 NOTE — Evaluation (Signed)
Occupational Therapy Evaluation Patient Details Name: Alex [name] MRN: [id number] DOB: 22-Aug-1985 Today's Date: 08/20/2019    History of Present Illness Patient is 34 y.o. male with a history of endocarditis, asthma, ADHD, depression, ETOH abuse, and heroin use was found unconscious and required narcan administration at jail. Patient transported via EMS and admitted on 6/8/21with agitation and opiate withdrawal which required intubation for airway protection. Patient extubated on 08/19/19.    Clinical Impression   Mr. Jodie Leiner is a 34 year old man s/p extubation. On evaluation he demonstrates normal ROM, strength and coordination of upper extremities. Patient demonstrates the ability to perform functional mobility and ADLs. No deficits seen or reported. No OT needs at this time.    Follow Up Recommendations  No OT follow up    Equipment Recommendations  None recommended by OT    Recommendations for Other Services       Precautions / Restrictions Precautions Precautions: Fall Restrictions Weight Bearing Restrictions: No      Mobility Bed Mobility Overal bed mobility: Modified Independent                Transfers Overall transfer level: Modified independent Equipment used: None             General transfer comment: MOdified independent secondary to shackles.    Balance Overall balance assessment: No apparent balance deficits (not formally assessed) (loss of balance secondary to shackles but otherwise normal.)                                         ADL either performed or assessed with clinical judgement   ADL Overall ADL's : Modified independent                                       General ADL Comments: Modified indepedent secondary to shackles and movement limitations. Reports ambulating to the bathroom and peforming toileting and showering without assistance. Patient stood at sink to perform grooming task.      Vision   Vision Assessment?: No apparent visual deficits     Perception     Praxis      Pertinent Vitals/Pain Pain Assessment: No/denies pain Faces Pain Scale: Hurts a little bit Pain Location: low back Pain Descriptors / Indicators: Aching;Discomfort Pain Intervention(s): Limited activity within patient's tolerance;Monitored during session     Hand Dominance Right   Extremity/Trunk Assessment Upper Extremity Assessment Upper Extremity Assessment: Overall WFL for tasks assessed (Normal ROM, strength and coordination of upper extremities.)   Lower Extremity Assessment Lower Extremity Assessment: Defer to PT evaluation   Cervical / Trunk Assessment Cervical / Trunk Assessment: Normal   Communication Communication Communication: No difficulties   Cognition Arousal/Alertness: Awake/alert Behavior During Therapy: WFL for tasks assessed/performed Overall Cognitive Status: Within Functional Limits for tasks assessed                                     General Comments       Exercises     Shoulder Instructions      Home Living Family/patient expects to be discharged to:: [facility]  Prior Functioning/Environment Level of Independence: Independent                 OT Problem List:        OT Treatment/Interventions:      OT Goals(Current goals can be found in the care plan section) Acute Rehab OT Goals Patient Stated Goal: none stated OT Goal Formulation: All assessment and education complete, DC therapy  OT Frequency:     Barriers to D/C:            Co-evaluation PT/OT/SLP Co-Evaluation/Treatment: Yes Reason for Co-Treatment: For patient/therapist safety;To address functional/ADL transfers PT goals addressed during session: Mobility/safety with mobility OT goals addressed during session: ADL's and self-care      AM-PAC OT "6 Clicks" Daily Activity     Outcome  Measure Help from another person eating meals?: None Help from another person taking care of personal grooming?: None Help from another person toileting, which includes using toliet, bedpan, or urinal?: None Help from another person bathing (including washing, rinsing, drying)?: None Help from another person to put on and taking off regular upper body clothing?: None Help from another person to put on and taking off regular lower body clothing?: None 6 Click Score: 24   End of Session Equipment Utilized During Treatment: Gait belt Nurse Communication: Mobility status  Activity Tolerance: Patient tolerated treatment well Patient left: in bed;with call bell/phone within reach Loss adjuster, chartered)  OT Visit Diagnosis: Unsteadiness on feet (R26.81)                Time: 1610-9604 OT Time Calculation (min): 17 min Charges:  OT General Charges $OT Visit: 1 Visit  Waldron Session, OTR/L Acute Care Rehab Services  Office [phone] Pager: [phone]   Kelli Churn 08/20/2019, 4:10 PM

## 2019-08-20 NOTE — TOC Transition Note (Signed)
Transition of Care [facility]) - CM/SW Discharge Note   Patient Details  Name: Alex [name] MRN: [id number] Date of Birth: Sep 11, 1985  [facility] CM/SW Contact:  Bartholome Bill, RN Phone Number: 08/20/2019, 3:15 PM   Clinical Narrative:    Substance abuse resources placed on AVS.

## 2019-08-24 LAB — CULTURE, BLOOD (ROUTINE X 2)
Culture: NO GROWTH
Special Requests: ADEQUATE

## 2019-10-11 ENCOUNTER — Encounter (HOSPITAL_COMMUNITY): Payer: Self-pay | Admitting: Emergency Medicine

## 2019-10-11 ENCOUNTER — Other Ambulatory Visit: Payer: Self-pay

## 2019-10-11 ENCOUNTER — Emergency Department (HOSPITAL_COMMUNITY)
Admission: EM | Admit: 2019-10-11 | Discharge: 2019-10-11 | Discharge status: Against Medical Advice | Attending: Emergency Medicine | Admitting: Emergency Medicine

## 2019-10-11 DIAGNOSIS — F909 Attention-deficit hyperactivity disorder, unspecified type: Secondary | ICD-10-CM | POA: Insufficient documentation

## 2019-10-11 DIAGNOSIS — T401X1A Poisoning by heroin, accidental (unintentional), initial encounter: Secondary | ICD-10-CM | POA: Insufficient documentation

## 2019-10-11 DIAGNOSIS — J45909 Unspecified asthma, uncomplicated: Secondary | ICD-10-CM | POA: Insufficient documentation

## 2019-10-11 DIAGNOSIS — Z79899 Other long term (current) drug therapy: Secondary | ICD-10-CM | POA: Insufficient documentation

## 2019-10-11 DIAGNOSIS — F1490 Cocaine use, unspecified, uncomplicated: Secondary | ICD-10-CM | POA: Insufficient documentation

## 2019-10-11 DIAGNOSIS — F1290 Cannabis use, unspecified, uncomplicated: Secondary | ICD-10-CM | POA: Insufficient documentation

## 2019-10-11 DIAGNOSIS — F17210 Nicotine dependence, cigarettes, uncomplicated: Secondary | ICD-10-CM | POA: Insufficient documentation

## 2019-10-11 DIAGNOSIS — F1190 Opioid use, unspecified, uncomplicated: Secondary | ICD-10-CM | POA: Insufficient documentation

## 2019-10-11 LAB — CBG MONITORING, ED: Glucose-Capillary: 107 mg/dL — ABNORMAL HIGH (ref 70–99)

## 2019-10-11 LAB — CBC WITH DIFFERENTIAL/PLATELET
Abs Immature Granulocytes: 0.01 10*3/uL (ref 0.00–0.07)
Basophils Absolute: 0 10*3/uL (ref 0.0–0.1)
Basophils Relative: 0 %
Eosinophils Absolute: 0.2 10*3/uL (ref 0.0–0.5)
Eosinophils Relative: 3 %
HCT: 33.9 % — ABNORMAL LOW (ref 39.0–52.0)
Hemoglobin: 11.3 g/dL — ABNORMAL LOW (ref 13.0–17.0)
Immature Granulocytes: 0 %
Lymphocytes Relative: 32 %
Lymphs Abs: 2.3 10*3/uL (ref 0.7–4.0)
MCH: 29.9 pg (ref 26.0–34.0)
MCHC: 33.3 g/dL (ref 30.0–36.0)
MCV: 89.7 fL (ref 80.0–100.0)
Monocytes Absolute: 0.9 10*3/uL (ref 0.1–1.0)
Monocytes Relative: 12 %
Neutro Abs: 3.8 10*3/uL (ref 1.7–7.7)
Neutrophils Relative %: 53 %
Platelets: 253 10*3/uL (ref 150–400)
RBC: 3.78 MIL/uL — ABNORMAL LOW (ref 4.22–5.81)
RDW: 13.8 % (ref 11.5–15.5)
WBC: 7.2 10*3/uL (ref 4.0–10.5)
nRBC: 0 % (ref 0.0–0.2)

## 2019-10-11 LAB — BASIC METABOLIC PANEL
Anion gap: 9 (ref 5–15)
BUN: 13 mg/dL (ref 6–20)
CO2: 29 mmol/L (ref 22–32)
Calcium: 9 mg/dL (ref 8.9–10.3)
Chloride: 103 mmol/L (ref 98–111)
Creatinine, Ser: 0.91 mg/dL (ref 0.61–1.24)
GFR calc Af Amer: >60 mL/min (ref >60)
GFR calc non Af Amer: >60 mL/min (ref >60)
Glucose, Bld: 103 mg/dL — ABNORMAL HIGH (ref 70–99)
Potassium: 3.6 mmol/L (ref 3.5–5.1)
Sodium: 141 mmol/L (ref 135–145)

## 2019-10-11 MED ORDER — NALOXONE HCL 0.4 MG/ML IJ SOLN: 0.4000 mg | Freq: Once | INTRAMUSCULAR | Status: DC

## 2019-10-11 MED ORDER — SODIUM CHLORIDE 0.9 % IV BOLUS
1000.0000 mL | Freq: Once | INTRAVENOUS | Status: AC
  Administered 2019-10-11: 1000 mL via INTRAVENOUS

## 2019-10-11 MED ORDER — NALOXONE HCL 0.4 MG/ML IJ SOLN
0.4000 mg | Freq: Once | INTRAMUSCULAR | Status: AC
  Administered 2019-10-11: 0.4 mg via INTRAVENOUS
  Filled 2019-10-11: qty 1

## 2019-10-11 MED ORDER — NALOXONE HCL 4 MG/10ML IJ SOLN: 1.0000 mg/h | INTRAVENOUS | Status: DC

## 2019-10-11 NOTE — ED Provider Notes (Signed)
[facility]-EMERGENCY DEPT Provider Note   CSN: [id number] Arrival date & time: 10/11/19  8466     History Chief Complaint  Patient presents with  . Drug Overdose    Alex [name] is a 34 y.o. male with PMH/o ADHD brought in by EMS for evaluation of drug overdose.  Patient found at [facility] and noted to be somnolent.  He does endorse using heroin.  He states he uses IV heroin and last used "earlier." HE states he was not trying to kill himself.   No Narcan given prior to ED arrival.  EM LEVEL 5 CAVEAT DUE TO ACUITY OF CONDITION  The history is provided by the patient.       Past Medical History:  Diagnosis Date  . ADHD (attention deficit hyperactivity disorder)   . Allergy   . Asthma   . Depressed   . ETOH abuse   . History of suicidal tendencies     Patient Active Problem List   Diagnosis Date Noted  . Agitation 08/19/2019  . Endocarditis of tricuspid valve   . IVDU (intravenous drug user)   . Sepsis (HCC) 12/28/2018  . Substance induced mood disorder (HCC) 04/24/2016  . Polysubstance dependence (HCC) 04/23/2016  . Major depressive disorder, single episode, severe without psychotic features (HCC) 04/23/2016  . 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 Tobacco use disorder 02/01/2014  . ADHD (attention deficit hyperactivity disorder)     Past Surgical History:  Procedure Laterality Date  . TEE WITHOUT CARDIOVERSION N/A 12/31/2018   Procedure: TRANSESOPHAGEAL ECHOCARDIOGRAM (TEE);  Surgeon: Thurmon Fair, MD;  Location: [facility] ENDOSCOPY;  Service: Cardiovascular;  Laterality: N/A;       Family History  Problem Relation Age of Onset  . COPD Other     Social History   Tobacco Use  . Smoking status: Current Every Day Smoker    Packs/day: 1.00    Years: 10.00    Pack years: 10.00    Types: Cigarettes  . Smokeless tobacco: Never Used  Vaping Use  . Vaping Use: Never used  Substance Use  Topics  . Alcohol use: Yes    Alcohol/week: 12.0 standard drinks    Types: 12 Cans of beer per week    Comment: 12 beers daily  . Drug use: Yes    Frequency: 6.0 times per week    Types: Marijuana, Cocaine, Heroin, Fentanyl, IV    Comment: daily use cocaine (snort)    Home Medications Prior to Admission medications   Medication Sig Start Date End Date Taking? Authorizing Provider  albuterol (PROVENTIL HFA) 108 (90 Base) MCG/ACT inhaler Inhale 1-2 puffs into the lungs every 4 (four) hours as needed for wheezing or shortness of breath. 09/08/18   Wieters, Hallie C, PA-C  albuterol (PROVENTIL) (2.5 MG/3ML) 0.083% nebulizer solution Take 3 mLs (2.5 mg total) by nebulization every 6 (six) hours as needed for wheezing or shortness of breath. Patient not taking: Reported on 08/20/2019 09/08/18   Wieters, Fran Lowes C, PA-C  cloNIDine (CATAPRES) 0.1 MG tablet Take 1 tablet (0.1 mg total) by mouth 2 (two) times daily in the am and at bedtime.. 08/20/19   Jae Dire, MD  cloNIDine (CATAPRES) 0.1 MG tablet Take 1 tablet (0.1 mg total) by mouth daily before breakfast. 08/23/19   Jae Dire, MD  ipratropium-albuterol (DUONEB) 0.5-2.5 (3) MG/3ML SOLN Take 3 mLs by nebulization every 6 (six) hours as needed (wheezing). Patient not taking: Reported on  12/28/2018 04/14/18 12/28/18  Elpidio Anis, PA-C  montelukast (SINGULAIR) 10 MG tablet Take 1 tablet (10 mg total) by mouth at bedtime. Patient not taking: Reported on 12/28/2018 07/25/17 12/28/18  Georgetta Haber, NP  traZODone (DESYREL) 50 MG tablet Take 1 tablet (50 mg total) by mouth at bedtime and may repeat dose one time if needed. Patient not taking: Reported on 07/05/2018 04/29/16 12/28/18  Oneta Rack, NP    Allergies    Patient has no known allergies.  Review of Systems   Review of Systems  Unable to perform ROS: Mental status change    Physical Exam Updated Vital Signs BP 108/75   Pulse 76   Temp 98.2 F (36.8 C)   Resp 16   SpO2  98%   Physical Exam Vitals and nursing note reviewed.  Constitutional:      Appearance: Normal appearance. He is well-developed.     Comments: Sleepy but arouses to loud verbal stimuli. Will fall asleep again.   HENT:     Head: Normocephalic and atraumatic.  Eyes:     General: Lids are normal.     Conjunctiva/sclera: Conjunctivae normal.     Pupils: Pupils are equal, round, and reactive to light.     Comments: 3 mm pupils noted bilaterally   Cardiovascular:     Rate and Rhythm: Normal rate and regular rhythm.     Pulses: Normal pulses.     Heart sounds: Normal heart sounds. No murmur heard.  No friction rub. No gallop.   Pulmonary:     Effort: Pulmonary effort is normal.     Breath sounds: Normal breath sounds.     Comments: Lungs clear to auscultation bilaterally.  Symmetric chest rise.  No wheezing, rales, rhonchi. His is protecting his airway.  Abdominal:     Palpations: Abdomen is soft. Abdomen is not rigid.     Tenderness: There is no abdominal tenderness. There is no guarding.     Comments: Abdomen is soft, non-distended, non-tender. No rigidity, No guarding. No peritoneal signs.  Musculoskeletal:        General: Normal range of motion.     Cervical back: Full passive range of motion without pain.  Skin:    General: Skin is warm and dry.     Capillary Refill: Capillary refill takes less than 2 seconds.  Neurological:     Mental Status: He is oriented to person, place, and time. He is lethargic.  Psychiatric:        Speech: Speech normal.     ED Results / Procedures / Treatments   Labs (all labs ordered are listed, but only abnormal results are displayed) Labs Reviewed  BASIC METABOLIC PANEL - Abnormal; Notable for the following components:      Result Value   Glucose, Bld 103 (*)    All other components within normal limits  CBC WITH DIFFERENTIAL/PLATELET - Abnormal; Notable for the following components:   RBC 3.78 (*)    Hemoglobin 11.3 (*)    HCT 33.9 (*)      All other components within normal limits  CBG MONITORING, ED - Abnormal; Notable for the following components:   Glucose-Capillary 107 (*)    All other components within normal limits  URINALYSIS, ROUTINE W REFLEX MICROSCOPIC  RAPID URINE DRUG SCREEN, HOSP PERFORMED    EKG None  Radiology No results found.  Procedures Procedures (including critical care time)  Medications Ordered in ED Medications  naloxone (NARCAN) injection 0.4 mg (has no  administration in time range)  naloxone [facility]) injection 0.4 mg (0.4 mg Intravenous Given 10/11/19 0812)  naloxone [facility]) injection 0.4 mg (0.4 mg Intravenous Given 10/11/19 0907)  sodium chloride 0.9 % bolus 1,000 mL (0 mLs Intravenous Stopped 10/11/19 1108)    ED Course  I have reviewed the triage vital signs and the nursing notes.  Pertinent labs & imaging results that were available during my care of the patient were reviewed by me and considered in my medical decision making (see chart for details).    MDM Rules/Calculators/A&P                          34 year old male who presents for evaluation of heroin overdose.  Found in a [facility] and was somnolent.  EMS gave no Narcan.  On initially arrival, he is afebrile.  He is lethargic and arouses to loud verbal stimuli but then will go back to sleep.  We will plan to check labs, give Narcan.  BMP shows normal BUN and creatinine.  CBC shows no leukocytosis.  Hemoglobin stable 11.3.  Reevaluation.  Patient still very sleepy and drowsy.  He responds to loud verbal stimuli but then will slowly drift back off to sleep.  I have difficulty continuing conversation with him.  Will give additional Narcan.  Reevaluation.  Patient still drowsy but slightly improved after fluids, Narcan x2.  We will plan to p.o. challenge and observe for 2 more hours.   Reevaluation. Patient is sitting up and eating. Will continue to observe.   RN informed me that patient eloped. RN confirmed that IV was out  and threw his IV away.   Portions of this note were generated with Scientist, clinical (histocompatibility and immunogenetics). Dictation errors may occur despite best attempts at proofreading.   Final Clinical Impression(s) / ED Diagnoses Final diagnoses:  Accidental overdose of heroin, initial encounter [facility])    Rx / DC Orders ED Discharge Orders    None       Maxwell Caul, PA-C 10/11/19 1504    Sabas Sous, MD 10/11/19 1517

## 2019-10-11 NOTE — ED Triage Notes (Signed)
Patient arrives via EMS s/p heroin use from the [facility]. Patient states he used "recently." Patient noted to be somnolent, but maintaining airway. No narcan given. Patient arouses to loud verbal stimuli.

## 2019-11-26 ENCOUNTER — Ambulatory Visit (HOSPITAL_COMMUNITY): Payer: Federal, State, Local not specified - Other | Admitting: Psychiatry

## 2019-11-26 ENCOUNTER — Encounter (HOSPITAL_COMMUNITY): Payer: Self-pay | Admitting: Psychiatry

## 2019-12-14 ENCOUNTER — Emergency Department (HOSPITAL_COMMUNITY): Payer: Self-pay

## 2019-12-14 ENCOUNTER — Other Ambulatory Visit: Payer: Self-pay

## 2019-12-14 ENCOUNTER — Encounter (HOSPITAL_COMMUNITY): Payer: Self-pay | Admitting: Emergency Medicine

## 2019-12-14 ENCOUNTER — Inpatient Hospital Stay (HOSPITAL_COMMUNITY)
Admission: EM | Admit: 2019-12-14 | Discharge: 2020-01-19 | DRG: 288 | Disposition: A | Discharge status: Home / Self Care | Payer: Self-pay | Attending: Internal Medicine | Admitting: Internal Medicine

## 2019-12-14 ENCOUNTER — Inpatient Hospital Stay (HOSPITAL_COMMUNITY): Payer: Self-pay

## 2019-12-14 DIAGNOSIS — I38 Endocarditis, valve unspecified: Secondary | ICD-10-CM

## 2019-12-14 DIAGNOSIS — I509 Heart failure, unspecified: Secondary | ICD-10-CM

## 2019-12-14 DIAGNOSIS — D649 Anemia, unspecified: Secondary | ICD-10-CM

## 2019-12-14 DIAGNOSIS — E871 Hypo-osmolality and hyponatremia: Secondary | ICD-10-CM | POA: Diagnosis present

## 2019-12-14 DIAGNOSIS — M310 Hypersensitivity angiitis: Secondary | ICD-10-CM | POA: Diagnosis present

## 2019-12-14 DIAGNOSIS — R7881 Bacteremia: Secondary | ICD-10-CM | POA: Diagnosis present

## 2019-12-14 DIAGNOSIS — J189 Pneumonia, unspecified organism: Secondary | ICD-10-CM

## 2019-12-14 DIAGNOSIS — R251 Tremor, unspecified: Secondary | ICD-10-CM | POA: Diagnosis not present

## 2019-12-14 DIAGNOSIS — Z9151 Personal history of suicidal behavior: Secondary | ICD-10-CM

## 2019-12-14 DIAGNOSIS — I2690 Septic pulmonary embolism without acute cor pulmonale: Secondary | ICD-10-CM | POA: Diagnosis present

## 2019-12-14 DIAGNOSIS — F1510 Other stimulant abuse, uncomplicated: Secondary | ICD-10-CM | POA: Diagnosis present

## 2019-12-14 DIAGNOSIS — Z79899 Other long term (current) drug therapy: Secondary | ICD-10-CM

## 2019-12-14 DIAGNOSIS — R590 Localized enlarged lymph nodes: Secondary | ICD-10-CM | POA: Diagnosis present

## 2019-12-14 DIAGNOSIS — N433 Hydrocele, unspecified: Secondary | ICD-10-CM | POA: Diagnosis present

## 2019-12-14 DIAGNOSIS — R609 Edema, unspecified: Secondary | ICD-10-CM

## 2019-12-14 DIAGNOSIS — I330 Acute and subacute infective endocarditis: Principal | ICD-10-CM | POA: Diagnosis present

## 2019-12-14 DIAGNOSIS — Z66 Do not resuscitate: Secondary | ICD-10-CM | POA: Diagnosis present

## 2019-12-14 DIAGNOSIS — K761 Chronic passive congestion of liver: Secondary | ICD-10-CM | POA: Diagnosis present

## 2019-12-14 DIAGNOSIS — Z7151 Drug abuse counseling and surveillance of drug abuser: Secondary | ICD-10-CM

## 2019-12-14 DIAGNOSIS — F17210 Nicotine dependence, cigarettes, uncomplicated: Secondary | ICD-10-CM | POA: Diagnosis present

## 2019-12-14 DIAGNOSIS — M546 Pain in thoracic spine: Secondary | ICD-10-CM | POA: Diagnosis present

## 2019-12-14 DIAGNOSIS — J302 Other seasonal allergic rhinitis: Secondary | ICD-10-CM | POA: Diagnosis present

## 2019-12-14 DIAGNOSIS — R911 Solitary pulmonary nodule: Secondary | ICD-10-CM | POA: Diagnosis present

## 2019-12-14 DIAGNOSIS — J90 Pleural effusion, not elsewhere classified: Secondary | ICD-10-CM

## 2019-12-14 DIAGNOSIS — B182 Chronic viral hepatitis C: Secondary | ICD-10-CM | POA: Diagnosis present

## 2019-12-14 DIAGNOSIS — I361 Nonrheumatic tricuspid (valve) insufficiency: Secondary | ICD-10-CM

## 2019-12-14 DIAGNOSIS — F1123 Opioid dependence with withdrawal: Secondary | ICD-10-CM | POA: Diagnosis not present

## 2019-12-14 DIAGNOSIS — F32A Depression, unspecified: Secondary | ICD-10-CM | POA: Diagnosis present

## 2019-12-14 DIAGNOSIS — K219 Gastro-esophageal reflux disease without esophagitis: Secondary | ICD-10-CM | POA: Diagnosis present

## 2019-12-14 DIAGNOSIS — Z20822 Contact with and (suspected) exposure to covid-19: Secondary | ICD-10-CM | POA: Diagnosis present

## 2019-12-14 DIAGNOSIS — M60001 Infective myositis, unspecified left arm: Secondary | ICD-10-CM | POA: Diagnosis present

## 2019-12-14 DIAGNOSIS — L270 Generalized skin eruption due to drugs and medicaments taken internally: Secondary | ICD-10-CM | POA: Diagnosis not present

## 2019-12-14 DIAGNOSIS — F909 Attention-deficit hyperactivity disorder, unspecified type: Secondary | ICD-10-CM | POA: Diagnosis present

## 2019-12-14 DIAGNOSIS — Z7189 Other specified counseling: Secondary | ICD-10-CM

## 2019-12-14 DIAGNOSIS — R7401 Elevation of levels of liver transaminase levels: Secondary | ICD-10-CM | POA: Diagnosis present

## 2019-12-14 DIAGNOSIS — F1410 Cocaine abuse, uncomplicated: Secondary | ICD-10-CM | POA: Diagnosis present

## 2019-12-14 DIAGNOSIS — R042 Hemoptysis: Secondary | ICD-10-CM | POA: Diagnosis not present

## 2019-12-14 DIAGNOSIS — R0602 Shortness of breath: Secondary | ICD-10-CM

## 2019-12-14 DIAGNOSIS — F1920 Other psychoactive substance dependence, uncomplicated: Secondary | ICD-10-CM

## 2019-12-14 DIAGNOSIS — D72829 Elevated white blood cell count, unspecified: Secondary | ICD-10-CM

## 2019-12-14 DIAGNOSIS — I50813 Acute on chronic right heart failure: Secondary | ICD-10-CM | POA: Diagnosis present

## 2019-12-14 DIAGNOSIS — J188 Other pneumonia, unspecified organism: Secondary | ICD-10-CM | POA: Diagnosis present

## 2019-12-14 DIAGNOSIS — F1990 Other psychoactive substance use, unspecified, uncomplicated: Secondary | ICD-10-CM

## 2019-12-14 DIAGNOSIS — D509 Iron deficiency anemia, unspecified: Secondary | ICD-10-CM | POA: Diagnosis present

## 2019-12-14 DIAGNOSIS — I313 Pericardial effusion (noninflammatory): Secondary | ICD-10-CM | POA: Diagnosis present

## 2019-12-14 DIAGNOSIS — R079 Chest pain, unspecified: Secondary | ICD-10-CM

## 2019-12-14 DIAGNOSIS — B9562 Methicillin resistant Staphylococcus aureus infection as the cause of diseases classified elsewhere: Secondary | ICD-10-CM | POA: Diagnosis present

## 2019-12-14 DIAGNOSIS — D638 Anemia in other chronic diseases classified elsewhere: Secondary | ICD-10-CM | POA: Diagnosis present

## 2019-12-14 DIAGNOSIS — M60009 Infective myositis, unspecified site: Secondary | ICD-10-CM

## 2019-12-14 DIAGNOSIS — Z8614 Personal history of Methicillin resistant Staphylococcus aureus infection: Secondary | ICD-10-CM

## 2019-12-14 DIAGNOSIS — R160 Hepatomegaly, not elsewhere classified: Secondary | ICD-10-CM | POA: Diagnosis present

## 2019-12-14 DIAGNOSIS — M6008 Infective myositis, other site: Secondary | ICD-10-CM | POA: Diagnosis present

## 2019-12-14 DIAGNOSIS — I776 Arteritis, unspecified: Secondary | ICD-10-CM

## 2019-12-14 DIAGNOSIS — T368X5A Adverse effect of other systemic antibiotics, initial encounter: Secondary | ICD-10-CM | POA: Diagnosis not present

## 2019-12-14 DIAGNOSIS — G894 Chronic pain syndrome: Secondary | ICD-10-CM | POA: Diagnosis present

## 2019-12-14 DIAGNOSIS — F419 Anxiety disorder, unspecified: Secondary | ICD-10-CM | POA: Diagnosis present

## 2019-12-14 DIAGNOSIS — Z681 Body mass index (BMI) 19 or less, adult: Secondary | ICD-10-CM

## 2019-12-14 DIAGNOSIS — Z23 Encounter for immunization: Secondary | ICD-10-CM

## 2019-12-14 DIAGNOSIS — E440 Moderate protein-calorie malnutrition: Secondary | ICD-10-CM | POA: Diagnosis present

## 2019-12-14 DIAGNOSIS — D539 Nutritional anemia, unspecified: Secondary | ICD-10-CM | POA: Diagnosis present

## 2019-12-14 DIAGNOSIS — Z5900 Homelessness unspecified: Secondary | ICD-10-CM

## 2019-12-14 DIAGNOSIS — G4700 Insomnia, unspecified: Secondary | ICD-10-CM | POA: Diagnosis present

## 2019-12-14 DIAGNOSIS — Z515 Encounter for palliative care: Secondary | ICD-10-CM

## 2019-12-14 DIAGNOSIS — R64 Cachexia: Secondary | ICD-10-CM | POA: Diagnosis present

## 2019-12-14 HISTORY — DX: Other psychoactive substance abuse, uncomplicated: F19.10

## 2019-12-14 LAB — CBC WITH DIFFERENTIAL/PLATELET
Abs Immature Granulocytes: 0.44 10*3/uL — ABNORMAL HIGH (ref 0.00–0.07)
Basophils Absolute: 0.1 10*3/uL (ref 0.0–0.1)
Basophils Relative: 0 %
Eosinophils Absolute: 0 10*3/uL (ref 0.0–0.5)
Eosinophils Relative: 0 %
HCT: 21.7 % — ABNORMAL LOW (ref 39.0–52.0)
Hemoglobin: 7.3 g/dL — ABNORMAL LOW (ref 13.0–17.0)
Immature Granulocytes: 2 %
Lymphocytes Relative: 12 %
Lymphs Abs: 2.7 10*3/uL (ref 0.7–4.0)
MCH: 29.1 pg (ref 26.0–34.0)
MCHC: 33.6 g/dL (ref 30.0–36.0)
MCV: 86.5 fL (ref 80.0–100.0)
Monocytes Absolute: 0.9 10*3/uL (ref 0.1–1.0)
Monocytes Relative: 4 %
Neutro Abs: 18.7 10*3/uL — ABNORMAL HIGH (ref 1.7–7.7)
Neutrophils Relative %: 82 %
Platelets: 315 10*3/uL (ref 150–400)
RBC: 2.51 MIL/uL — ABNORMAL LOW (ref 4.22–5.81)
RDW: 16.2 % — ABNORMAL HIGH (ref 11.5–15.5)
WBC: 22.9 10*3/uL — ABNORMAL HIGH (ref 4.0–10.5)
nRBC: 0 % (ref 0.0–0.2)

## 2019-12-14 LAB — RESPIRATORY PANEL BY RT PCR (FLU A&B, COVID)
Influenza A by PCR: NEGATIVE
Influenza B by PCR: NEGATIVE
SARS Coronavirus 2 by RT PCR: NEGATIVE

## 2019-12-14 LAB — BASIC METABOLIC PANEL
Anion gap: 8 (ref 5–15)
BUN: 17 mg/dL (ref 6–20)
CO2: 23 mmol/L (ref 22–32)
Calcium: 7.5 mg/dL — ABNORMAL LOW (ref 8.9–10.3)
Chloride: 100 mmol/L (ref 98–111)
Creatinine, Ser: 0.97 mg/dL (ref 0.61–1.24)
GFR calc Af Amer: >60 mL/min (ref >60)
GFR calc non Af Amer: >60 mL/min (ref >60)
Glucose, Bld: 91 mg/dL (ref 70–99)
Potassium: 3.7 mmol/L (ref 3.5–5.1)
Sodium: 131 mmol/L — ABNORMAL LOW (ref 135–145)

## 2019-12-14 LAB — IRON AND TIBC
Iron: 30 ug/dL — ABNORMAL LOW (ref 45–182)
Saturation Ratios: 15 % — ABNORMAL LOW (ref 17.9–39.5)
TIBC: 202 ug/dL — ABNORMAL LOW (ref 250–450)
UIBC: 172 ug/dL

## 2019-12-14 LAB — URINALYSIS, ROUTINE W REFLEX MICROSCOPIC
Bacteria, UA: NONE SEEN
Bilirubin Urine: NEGATIVE
Glucose, UA: NEGATIVE mg/dL
Ketones, ur: NEGATIVE mg/dL
Nitrite: NEGATIVE
Protein, ur: NEGATIVE mg/dL
Specific Gravity, Urine: 1.014 (ref 1.005–1.030)
pH: 6 (ref 5.0–8.0)

## 2019-12-14 LAB — LACTIC ACID, PLASMA
Lactic Acid, Venous: 1.8 mmol/L (ref 0.5–1.9)
Lactic Acid, Venous: 2.2 mmol/L (ref 0.5–1.9)

## 2019-12-14 LAB — HEMOGLOBIN AND HEMATOCRIT, BLOOD
HCT: 23.7 % — ABNORMAL LOW (ref 39.0–52.0)
HCT: 22.1 % — ABNORMAL LOW (ref 39.0–52.0)
Hemoglobin: 7.6 g/dL — ABNORMAL LOW (ref 13.0–17.0)
Hemoglobin: 7.1 g/dL — ABNORMAL LOW (ref 13.0–17.0)

## 2019-12-14 LAB — ECHOCARDIOGRAM COMPLETE
Area-P 1/2: 4.68 cm2
Height: 68 in
S' Lateral: 2.77 cm
Weight: 1904 oz

## 2019-12-14 LAB — HEPATIC FUNCTION PANEL
ALT: 129 U/L — ABNORMAL HIGH (ref 0–44)
AST: 100 U/L — ABNORMAL HIGH (ref 15–41)
Albumin: 1.5 g/dL — ABNORMAL LOW (ref 3.5–5.0)
Alkaline Phosphatase: 224 U/L — ABNORMAL HIGH (ref 38–126)
Bilirubin, Direct: 0.6 mg/dL — ABNORMAL HIGH (ref 0.0–0.2)
Indirect Bilirubin: 0.8 mg/dL (ref 0.3–0.9)
Total Bilirubin: 1.4 mg/dL — ABNORMAL HIGH (ref 0.3–1.2)
Total Protein: 6.6 g/dL (ref 6.5–8.1)

## 2019-12-14 LAB — FERRITIN: Ferritin: 579 ng/mL — ABNORMAL HIGH (ref 24–336)

## 2019-12-14 LAB — BRAIN NATRIURETIC PEPTIDE: B Natriuretic Peptide: 526.1 pg/mL — ABNORMAL HIGH (ref 0.0–100.0)

## 2019-12-14 MED ORDER — HYDROMORPHONE HCL 1 MG/ML IJ SOLN
1.0000 mg | Freq: Once | INTRAMUSCULAR | Status: AC
  Administered 2019-12-14: 1 mg via INTRAVENOUS
  Filled 2019-12-14: qty 1

## 2019-12-14 MED ORDER — MORPHINE SULFATE (PF) 2 MG/ML IV SOLN
1.0000 mg | INTRAVENOUS | Status: DC | PRN
  Administered 2019-12-14 (×2): 1 mg via INTRAVENOUS
  Filled 2019-12-14 (×2): qty 1

## 2019-12-14 MED ORDER — SODIUM CHLORIDE 0.9% FLUSH
3.0000 mL | INTRAVENOUS | Status: DC | PRN
  Administered 2019-12-28 – 2020-01-06 (×3): 3 mL via INTRAVENOUS

## 2019-12-14 MED ORDER — SODIUM CHLORIDE 0.9% FLUSH
3.0000 mL | Freq: Two times a day (BID) | INTRAVENOUS | Status: DC
  Administered 2019-12-14 – 2020-01-18 (×40): 3 mL via INTRAVENOUS

## 2019-12-14 MED ORDER — HYDROMORPHONE HCL 1 MG/ML IJ SOLN
0.5000 mg | INTRAMUSCULAR | Status: DC | PRN
  Administered 2019-12-14 – 2019-12-16 (×19): 0.5 mg via INTRAVENOUS
  Filled 2019-12-14 (×19): qty 0.5

## 2019-12-14 MED ORDER — SODIUM CHLORIDE 0.9 % IV SOLN
250.0000 mL | INTRAVENOUS | Status: DC | PRN
  Administered 2019-12-16 – 2019-12-25 (×2): 250 mL via INTRAVENOUS

## 2019-12-14 MED ORDER — VANCOMYCIN HCL IN DEXTROSE 1-5 GM/200ML-% IV SOLN
1000.0000 mg | Freq: Once | INTRAVENOUS | Status: AC
  Administered 2019-12-14: 1000 mg via INTRAVENOUS
  Filled 2019-12-14: qty 200

## 2019-12-14 MED ORDER — ONDANSETRON HCL 4 MG/2ML IJ SOLN
4.0000 mg | Freq: Four times a day (QID) | INTRAMUSCULAR | Status: DC | PRN
  Administered 2019-12-18: 4 mg via INTRAVENOUS

## 2019-12-14 MED ORDER — FUROSEMIDE 10 MG/ML IJ SOLN
20.0000 mg | Freq: Once | INTRAMUSCULAR | Status: AC
  Administered 2019-12-14: 20 mg via INTRAVENOUS
  Filled 2019-12-14: qty 2

## 2019-12-14 MED ORDER — MELATONIN 3 MG PO TABS
3.0000 mg | ORAL_TABLET | Freq: Every day | ORAL | Status: DC
  Administered 2019-12-14 – 2019-12-17 (×4): 3 mg via ORAL
  Filled 2019-12-14 (×4): qty 1

## 2019-12-14 MED ORDER — VANCOMYCIN HCL 750 MG/150ML IV SOLN
750.0000 mg | Freq: Two times a day (BID) | INTRAVENOUS | Status: DC
  Administered 2019-12-14 – 2019-12-17 (×6): 750 mg via INTRAVENOUS
  Filled 2019-12-14 (×6): qty 150

## 2019-12-14 MED ORDER — ENOXAPARIN SODIUM 40 MG/0.4ML ~~LOC~~ SOLN: 40.0000 mg | SUBCUTANEOUS | Status: DC

## 2019-12-14 MED ORDER — ACETAMINOPHEN 325 MG PO TABS
650.0000 mg | ORAL_TABLET | ORAL | Status: DC | PRN
  Administered 2019-12-15 – 2020-01-05 (×8): 650 mg via ORAL
  Filled 2019-12-14 (×8): qty 2

## 2019-12-14 MED ORDER — PIPERACILLIN-TAZOBACTAM 3.375 G IVPB
3.3750 g | Freq: Once | INTRAVENOUS | Status: DC
  Administered 2019-12-14: 3.375 g via INTRAVENOUS
  Filled 2019-12-14: qty 50

## 2019-12-14 NOTE — Progress Notes (Signed)
A consult was received from an ED physician for vancomycin and zosyn per pharmacy dosing.  The patient's profile has been reviewed for ht/wt/allergies/indication/available labs.   A one time order has been placed for vancomycin 1g and zosyn 3.375g.  Further antibiotics/pharmacy consults should be ordered by admitting physician if indicated.                       Thank you, Loralee Pacas, PharmD, BCPS 12/14/2019  8:09 AM

## 2019-12-14 NOTE — Progress Notes (Signed)
Pharmacy Antibiotic Note  Alex [name] is a 34 y.o. 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 Pharmacy has been consulted for vancomycin dosing.  Plan:  Vancomycin 1000 mg IV now, then 750 mg IV q12 hr (est AUC 533 based on SCr 0.97; Vd 0.72)  Measure vancomycin AUC at steady state as indicated - discussed possibility of using daptomycin instead of vanc w/ MD, given difficulties checking levels and equivalent cost; ID to see later today so will defer abx selection to them  SCr q48 while on vanc   Patient required higher doses of vancomycin during Oct 2020 admission, but note SCr was significantly lower at that time; could be dehydrated this admit so will need to f/u SCr closely and increase vanc aggressively if trending down.  Height: 5\' 8"  (172.7 cm) Weight: 54 kg (119 lb) IBW/kg (Calculated) : 68.4  Temp (24hrs), Avg:98.1 F (36.7 C), Min:98 F (36.7 C), Max:98.2 F (36.8 C)  Recent Labs  Lab 12/14/19 0810  WBC 22.9*  CREATININE 0.97  LATICACIDVEN 1.8    Estimated Creatinine Clearance: 82 mL/min (by C-G formula based on SCr of 0.97 mg/dL).    No Known Allergies   Thank you for allowing pharmacy to be a part of this patient's care.  Albirtha Grinage A 12/14/2019 11:08 AM

## 2019-12-14 NOTE — Progress Notes (Signed)
Notified MD of patients lactic acid of 2.2. No new orders placed.

## 2019-12-14 NOTE — Consult Note (Signed)
[facility]  Total days of antibiotics 1 Reason for Consult: endocarditis    Referring Physician: segal  Principal Problem:   Endocarditis Active Problems:   Polysubstance dependence (HCC)   IVDU (intravenous drug user)   Acute CHF (congestive heart failure) (HCC)   Anemia    HPI: Alex [name] is a 34 y.o. male  With IVDU, hx of mrsa endocarditis in 2020, recently admitted over a week ago at [facility] in [place] for feeling poorly. He was found to have  MRSA bacteremia and endocarditis (per patient report, awaiting hospital records). He left [facility] roughly a week ago and readmitted for feeling poorly. He is afebrile, tachycardic, WBC of 22K, hgb 7.3 (BL 11), cr 0.97, AST/ALT 100s. BNP 526. Iron defiiciency . CXR by my read has multiple patchy infiltrate with cavitary lesions. He was started on vancomycin. TTE shows RVE, large mobile vegetation on septal and lateral leaflet with wide open TR. He reports still using cocaine, laced with fentanyl. 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   ETOH abuse    History of suicidal tendencies    Polysubstance abuse (HCC)     Allergies: No Known Allergies   MEDICATIONS:  sodium chloride flush  3 mL Intravenous Q12H    Social History   Tobacco Use   Smoking status: Current Every Day Smoker    Packs/day: 1.00    Years: 10.00    Pack years: 10.00    Types: Cigarettes   Smokeless tobacco: Never Used  Vaping Use   Vaping Use: Never used  Substance Use Topics   Alcohol use: Yes    Alcohol/week: 12.0 standard drinks    Types: 12 Cans of beer per week    Comment: 12 beers daily   Drug use: Yes    Frequency: 6.0 times per week    Types: Marijuana, Cocaine, Heroin, Fentanyl, IV    Comment: daily use cocaine (snort)     Family History  Problem Relation Age of Onset   COPD Other      Review of Systems  Constitutional: + for fever, chills, diaphoresis, activity change, appetite change, fatigue and unexpected weight change.  HENT: Negative for congestion, sore throat, rhinorrhea, sneezing, trouble swallowing and sinus pressure.  Eyes: Negative for photophobia and visual disturbance.  Respiratory: Negative for cough, chest tightness, shortness of breath, wheezing and stridor.  Cardiovascular: Negative for chest pain, palpitations and leg swelling.  Gastrointestinal: Negative for nausea, vomiting, abdominal pain, diarrhea, constipation, blood in stool, abdominal distention and anal bleeding.  Genitourinary: Negative for dysuria, hematuria, flank pain and difficulty urinating.  Musculoskeletal: + for myalgias, back pain, joint swelling, arthralgias and gait problem.  Skin: Negative for color change, pallor, rash and wound.  Neurological: Negative for dizziness, tremors, weakness and light-headedness.  Hematological: Negative for adenopathy. Does not bruise/bleed easily.  Psychiatric/Behavioral: Negative for behavioral problems, confusion, sleep disturbance, dysphoric mood, decreased concentration and agitation.     OBJECTIVE: Temp:  [98 F (36.7 C)-100 F (37.8 C)] 98.5 F (36.9 C) (10/04 1722) Pulse Rate:  [87-94] 88 (10/04 1722) Resp:  [14-20] 20 (10/04 1722) BP: (98-161)/(64-78) 99/74 (10/04 1722) SpO2:  [95 %-99 %] 99 % (10/04 1722) Weight:  [54 kg] 54 kg (10/04 0330) Physical Exam  Constitutional: He is  oriented to person, place, and time. He appears chronically ill and malnourished. No distress.  HENT: pale conjunctiva, pale  Mouth/Throat: Oropharynx is clear and moist. No oropharyngeal exudate.  Cardiovascular: Normal rate, regular rhythm and normal heart sounds. +murmur LUSB Pulmonary/Chest: Effort normal and breath sounds normal. No respiratory distress. He has no wheezes.  Abdominal:  Soft. Bowel sounds are normal. He exhibits no distension. There is no tenderness.  Lymphadenopathy:  He has no cervical adenopathy.  Ext: +1 edema bilateral ankles Neurological: He is alert and oriented to person, place, and time.  Skin: Skin is warm and dry. No rash noted. No erythema.  Psychiatric: He has a normal mood and affect. His behavior is normal.     LABS: Results for orders placed or performed during the hospital encounter of 12/14/19 (from the past 48 hour(s))  Blood culture (routine x 2)     Status: None (Preliminary result)   Collection Time: 12/14/19  8:09 AM   Specimen: BLOOD  Result Value Ref Range   Specimen Description      BLOOD LEFT ANTECUBITAL Performed at [facility], [address], [place], [place] [address]    Special Requests      BOTTLES DRAWN AEROBIC AND ANAEROBIC Blood Culture adequate volume Performed at [facility], [address], [place], [place] [address]    Culture PENDING    Report Status PENDING   CBC with Differential     Status: Abnormal   Collection Time: 12/14/19  8:10 AM  Result Value Ref Range   WBC 22.9 (H) 4.0 - 10.5 K/uL   RBC 2.51 (L) 4.22 - 5.81 MIL/uL   Hemoglobin 7.3 (L) 13.0 - 17.0 g/dL   HCT 32.3 (L) 39 - 52 %   MCV 86.5 80.0 - 100.0 fL   MCH 29.1 26.0 - 34.0 pg   MCHC 33.6 30.0 - 36.0 g/dL   RDW 55.7 (H) 32.2 - 02.5 %   Platelets 315 150 - 400 K/uL   nRBC 0.0 0.0 - 0.2 %   Neutrophils Relative % 82 %   Neutro Abs 18.7 (H) 1.7 - 7.7 K/uL   Lymphocytes Relative 12 %   Lymphs Abs 2.7 0.7 - 4.0 K/uL   Monocytes Relative 4 %   Monocytes Absolute 0.9 0 - 1 K/uL   Eosinophils Relative 0 %   Eosinophils Absolute 0.0 0 - 0 K/uL   Basophils Relative 0 %   Basophils Absolute 0.1 0 - 0 K/uL   Immature Granulocytes 2 %   Abs Immature Granulocytes 0.44 (H) 0.00 - 0.07 K/uL    Comment: Performed at [facility], [address] [place], [place] [address]  Basic metabolic panel      Status: Abnormal   Collection Time: 12/14/19  8:10 AM  Result Value Ref Range   Sodium 131 (L) 135 - 145 mmol/L   Potassium 3.7 3.5 - 5.1 mmol/L   Chloride 100 98 - 111 mmol/L   CO2 23 22 - 32 mmol/L   Glucose, Bld 91 70 - 99 mg/dL    Comment: Glucose reference range applies only to samples taken after fasting for at least 8 hours.   BUN 17 6 - 20 mg/dL   Creatinine, Ser 2.37 0.61 - 1.24 mg/dL   Calcium 7.5 (L) 8.9 - 10.3 mg/dL   GFR calc non Af Amer >60 >60 mL/min   GFR calc Af Amer >60 >60 mL/min   Anion gap 8 5 - 15  Comment: Performed at [facility], [address], [place], [place] [address]  Hepatic function panel     Status: Abnormal   Collection Time: 12/14/19  8:10 AM  Result Value Ref Range   Total Protein 6.6 6.5 - 8.1 g/dL   Albumin 1.5 (L) 3.5 - 5.0 g/dL   AST 539 (H) 15 - 41 U/L   ALT 129 (H) 0 - 44 U/L   Alkaline Phosphatase 224 (H) 38 - 126 U/L   Total Bilirubin 1.4 (H) 0.3 - 1.2 mg/dL   Bilirubin, Direct 0.6 (H) 0.0 - 0.2 mg/dL   Indirect Bilirubin 0.8 0.3 - 0.9 mg/dL    Comment: Performed at [facility], [address], [place], [place] [address]  Respiratory Panel by RT PCR (Flu A&B, Covid) - Nasopharyngeal Swab     Status: None   Collection Time: 12/14/19  8:10 AM   Specimen: Nasopharyngeal Swab  Result Value Ref Range   SARS Coronavirus 2 by RT PCR NEGATIVE NEGATIVE    Comment: (NOTE) SARS-CoV-2 target nucleic acids are NOT DETECTED.  The SARS-CoV-2 RNA is generally detectable in upper respiratoy specimens during the acute phase of infection. The lowest concentration of SARS-CoV-2 viral copies this assay can detect is 131 copies/mL. A negative result does not preclude SARS-Cov-2 infection and should not be used as the sole basis for treatment or other patient management decisions. A negative result may occur with  improper specimen collection/handling, submission of specimen other than nasopharyngeal swab,  presence of viral mutation(s) within the areas targeted by this assay, and inadequate number of viral copies (<131 copies/mL). A negative result must be combined with clinical observations, patient history, and epidemiological information. The expected result is Negative.  Fact Sheet for Patients:  https://www.moore.com/  Fact Sheet for Healthcare Providers:  https://www.young.biz/  This test is no t yet approved or cleared by the [place] FDA and  has been authorized for detection and/or diagnosis of SARS-CoV-2 by FDA under an Emergency Use Authorization (EUA). This EUA will remain  in effect (meaning this test can be used) for the duration of the COVID-19 declaration under Section 564(b)(1) of the Act, 21 U.S.C. section 360bbb-3(b)(1), unless the authorization is terminated or revoked sooner.     Influenza A by PCR NEGATIVE NEGATIVE   Influenza B by PCR NEGATIVE NEGATIVE    Comment: (NOTE) The Xpert Xpress SARS-CoV-2/FLU/RSV assay is intended as an aid in  the diagnosis of influenza from Nasopharyngeal swab specimens and  should not be used as a sole basis for treatment. Nasal washings and  aspirates are unacceptable for Xpert Xpress SARS-CoV-2/FLU/RSV  testing.  Fact Sheet for Patients: https://www.moore.com/  Fact Sheet for Healthcare Providers: https://www.young.biz/  This test is not yet approved or cleared by the [place] FDA and  has been authorized for detection and/or diagnosis of SARS-CoV-2 by  FDA under an Emergency Use Authorization (EUA). This EUA will remain  in effect (meaning this test can be used) for the duration of the  Covid-19 declaration under Section 564(b)(1) of the Act, 21  U.S.C. section 360bbb-3(b)(1), unless the authorization is  terminated or revoked. Performed at [facility], [address] [place], [place] [address]   Brain  natriuretic peptide     Status: Abnormal   Collection Time: 12/14/19  8:10 AM  Result Value Ref Range   B Natriuretic Peptide 526.1 (H) 0.0 - 100.0 pg/mL    Comment: Performed at [facility], [address] [place], [place] [address]  Lactic acid, plasma     Status: None   Collection Time: 12/14/19  8:10 AM  Result Value Ref Range   Lactic Acid, Venous 1.8 0.5 - 1.9 mmol/L    Comment: Performed at [facility], [address] [place], [place] [address]  Urinalysis, Routine w reflex microscopic Urine, Clean Catch     Status: Abnormal   Collection Time: 12/14/19 11:43 AM  Result Value Ref Range   Color, Urine YELLOW YELLOW   APPearance CLEAR CLEAR   Specific Gravity, Urine 1.014 1.005 - 1.030   pH 6.0 5.0 - 8.0   Glucose, UA NEGATIVE NEGATIVE mg/dL   Hgb urine dipstick LARGE (A) NEGATIVE   Bilirubin Urine NEGATIVE NEGATIVE   Ketones, ur NEGATIVE NEGATIVE mg/dL   Protein, ur NEGATIVE NEGATIVE mg/dL   Nitrite NEGATIVE NEGATIVE   Leukocytes,Ua TRACE (A) NEGATIVE   RBC / HPF 21-50 0 - 5 RBC/hpf   WBC, UA 11-20 0 - 5 WBC/hpf   Bacteria, UA NONE SEEN NONE SEEN   Mucus PRESENT     Comment: Performed at [facility], [address] [place], [place] [address]  Lactic acid, plasma     Status: Abnormal   Collection Time: 12/14/19 12:52 PM  Result Value Ref Range   Lactic Acid, Venous 2.2 (HH) 0.5 - 1.9 mmol/L    Comment: CRITICAL RESULT CALLED TO, READ BACK BY AND VERIFIED WITH: FRADY,H. RN AT 1355 12/14/19 MULLINS,T Performed at [facility], [address] [place], [place] [address]   Hemoglobin and hematocrit, blood     Status: Abnormal   Collection Time: 12/14/19 12:52 PM  Result Value Ref Range   Hemoglobin 7.6 (L) 13.0 - 17.0 g/dL   HCT 65.723.7 (L) 39 - 52 %    Comment: Performed at [facility], [address] [place], [place] [address]  Ferritin     Status: Abnormal   Collection  Time: 12/14/19 12:52 PM  Result Value Ref Range   Ferritin 579 (H) 24 - 336 ng/mL    Comment: Performed at [facility], [address] [place], [place] [address]  Iron and TIBC     Status: Abnormal   Collection Time: 12/14/19 12:52 PM  Result Value Ref Range   Iron 30 (L) 45 - 182 ug/dL   TIBC 413202 (L) 244250 - 010450 ug/dL   Saturation Ratios 15 (L) 17.9 - 39.5 %   UIBC 172 ug/dL    Comment: Performed at [facility], [address] [place], [place] [address]    MICRO: reviewed IMAGING: DG Chest 2 View  Result Date: 12/14/2019 CLINICAL DATA:  Shortness of breath. Reported history of endocarditis EXAM: CHEST - 2 VIEW COMPARISON:  August 18, 2019 FINDINGS: There is multifocal airspace opacity throughout the lungs bilaterally. There is suspected cavitation in several lesions. Heart is upper normal in size with pulmonary vascularity normal. No adenopathy. No bone lesions. IMPRESSION: Multifocal airspace opacity bilaterally with several areas of suspected cavitation. Concern for septic emboli given areas of suspected cavitation and history of endocarditis. A degree of superimposed pneumonia and/or aspiration is also possible. Heart upper normal in size.  No adenopathy. Electronically Signed   By: Bretta BangWilliam  Woodruff III M.D.   On: 12/14/2019 09:31   ECHOCARDIOGRAM COMPLETE  Result Date: 12/14/2019    ECHOCARDIOGRAM REPORT   Patient Name:   [name] M Mcglaughlin Date of Exam: 12/14/2019 Medical Rec #:  [id number]      Height:       68.0 in Accession #:    [id number]     Weight:       119.0 lb Date of Birth:  04/29/1985      BSA:          1.639 m Patient Age:    34 years       BP:           98/64 mmHg Patient Gender: M              HR:           92 bpm. Exam Location:  Inpatient Procedure: 2D Echo Indications:    endocarditis  History:        Patient has prior history of Echocardiogram examinations, most                 recent 12/31/2018. CHF, Endocarditis; Risk Factors:IV drug  use.  Sonographer:    Delcie Roch Referring Phys: 60 DAYNA N DUNN IMPRESSIONS  1. Left ventricular ejection fraction, by estimation, is 60 to 65%. The left ventricle has normal function. The left ventricle has no regional wall motion abnormalities. Left ventricular diastolic parameters were normal.  2. Right ventricular systolic function is mildly reduced. The right ventricular size is severely enlarged. There is mildly elevated pulmonary artery systolic pressure.  3. Right atrial size was severely dilated.  4. A small pericardial effusion is present. The pericardial effusion is posterior to the left ventricle.  5. The mitral valve is normal in structure. Trivial mitral valve regurgitation. No evidence of mitral stenosis.  6. Large mobile vegetation on the septal and lateral leaflet with leaflet destruction and failure to coapt leading to wide open TR. The tricuspid valve is abnormal. Tricuspid valve regurgitation is severe.  7. The aortic valve is normal in structure. Aortic valve regurgitation is not visualized. No aortic stenosis is present.  8. The inferior vena cava is dilated in size with <50% respiratory variability, suggesting right atrial pressure of 15 mmHg. FINDINGS  Left Ventricle: Left ventricular ejection fraction, by estimation, is 60 to 65%. The left ventricle has normal function. The left ventricle has no regional wall motion abnormalities. The left ventricular internal cavity size was normal in size. There is  no left ventricular hypertrophy. Left ventricular diastolic parameters were normal. Right Ventricle: The right ventricular size is severely enlarged. No increase in right ventricular wall thickness. Right ventricular systolic function is mildly reduced. There is mildly elevated pulmonary artery systolic pressure. The tricuspid regurgitant velocity is 2.70 m/s, and with an assumed right atrial pressure of 15 mmHg, the estimated right ventricular systolic pressure is 44.2 mmHg. Left  Atrium: Left atrial size was normal in size. Right Atrium: Right atrial size was severely dilated. Pericardium: A small pericardial effusion is present. The pericardial effusion is posterior to the left ventricle. Mitral Valve: The mitral valve is normal in structure. Trivial mitral valve regurgitation. No evidence of mitral valve stenosis. Tricuspid Valve: Large mobile vegetation on the septal and lateral leaflet with leaflet destruction and failure to coapt leading to wide open TR. The tricuspid valve is abnormal. Tricuspid valve regurgitation is severe. No evidence of tricuspid stenosis. Aortic Valve: The aortic valve is normal in structure. Aortic valve regurgitation is not visualized. No aortic stenosis is present. Pulmonic Valve: The pulmonic valve was normal in structure. Pulmonic valve regurgitation is trivial. No evidence of pulmonic stenosis. Aorta: The aortic root is normal in size  and structure. Venous: The inferior vena cava is dilated in size with less than 50% respiratory variability, suggesting right atrial pressure of 15 mmHg. IAS/Shunts: No atrial level shunt detected by color flow Doppler.  LEFT VENTRICLE PLAX 2D LVIDd:         4.45 cm  Diastology LVIDs:         2.77 cm  LV e' medial:    12.50 cm/s LV PW:         0.98 cm  LV E/e' medial:  4.8 LV IVS:        0.91 cm  LV e' lateral:   16.80 cm/s LVOT diam:     1.70 cm  LV E/e' lateral: 3.6 LV SV:         29 LV SV Index:   17 LVOT Area:     2.27 cm  RIGHT VENTRICLE             IVC RV S prime:     15.80 cm/s  IVC diam: 2.71 cm TAPSE (M-mode): 2.0 cm LEFT ATRIUM             Index LA diam:        3.50 cm 2.14 cm/m LA Vol (A2C):   53.4 ml 32.58 ml/m LA Vol (A4C):   39.3 ml 23.98 ml/m LA Biplane Vol: 48.0 ml 29.29 ml/m  AORTIC VALVE LVOT Vmax:   80.80 cm/s LVOT Vmean:  49.100 cm/s LVOT VTI:    0.126 m  AORTA Ao Root diam: 2.80 cm Ao Asc diam:  2.70 cm MITRAL VALVE               TRICUSPID VALVE MV Area (PHT): 4.68 cm    TR Peak grad:   29.2 mmHg MV  Decel Time: 162 msec    TR Vmax:        270.00 cm/s MV E velocity: 60.20 cm/s MV A velocity: 49.80 cm/s  SHUNTS MV E/A ratio:  1.21        Systemic VTI:  0.13 m                            Systemic Diam: 1.70 cm Charlton Haws MD Electronically signed by Charlton Haws MD Signature Date/Time: 12/14/2019/3:58:33 PM    Final     Assessment/Plan:  34yo M with ivdu who is admitted for TV endocarditis with pulmonary cavitary lesions  - continue on vancomycin - acquire info from [facility] to review culture results - blood cx pending - have CT evaluate patient to see if he is a candidate for angiovac/debulking - repeat blood cx in 2-3d - also recommend imaging of spine (but this may have already been done at outside hospital. Will review records before repeat imaging)  transaminitis = likely from CHF, congestive hepatopathy +/- viral hepatitis. Check for hep C ab (previously negative)  Opiate withdrawal = per primary team.

## 2019-12-14 NOTE — ED Provider Notes (Signed)
[facility]-EMERGENCY DEPT Provider Note   CSN: [id number] Arrival date & time: 12/14/19  0316     History Chief Complaint  Patient presents with  . Fatigue    Alex [name] is a 34 y.o. male.  Presents to ER for hospital admission for bacteremia, endocarditis.  Reports that 1 week ago he went to [facility], feeling generally unwell.  He reports that they found MRSA in his blood, reports having septic pulmonary embolism and reports being diagnosed with endocarditis after ultrasound of his heart.  He states that he did not appreciate the care that he was receiving and decided to leave.  He thinks the last dose of antibiotics was around 2 days ago when he left [place].  Reports that he still uses IV drugs.  Today feels generally unwell, has aches and pains in all of his arms and legs, concern for increased leg swelling.  Also over the last couple days has noted some back pain, worse across lower back, worse with certain positions.  HPI     Past Medical History:  Diagnosis Date  . ADHD (attention deficit hyperactivity disorder)   . Allergy   . Asthma   . Depressed   . ETOH abuse   . History of suicidal tendencies     Patient Active Problem List   Diagnosis Date Noted  . Endocarditis 12/14/2019  . Agitation 08/19/2019  . Endocarditis of tricuspid valve   . IVDU (intravenous drug user)   . Sepsis (HCC) 12/28/2018  . Substance induced mood disorder (HCC) 04/24/2016  . Polysubstance dependence (HCC) 04/23/2016  . Major depressive disorder, single episode, severe without psychotic features (HCC) 04/23/2016  . 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 Tobacco use disorder 02/01/2014  . ADHD (attention deficit hyperactivity disorder)     Past Surgical History:  Procedure Laterality Date  . TEE WITHOUT CARDIOVERSION N/A 12/31/2018   Procedure: TRANSESOPHAGEAL ECHOCARDIOGRAM (TEE);  Surgeon: Thurmon Fair, MD;  Location: [facility] ENDOSCOPY;  Service: Cardiovascular;  Laterality: N/A;       Family History  Problem Relation Age of Onset  . COPD Other     Social History   Tobacco Use  . Smoking status: Current Every Day Smoker    Packs/day: 1.00    Years: 10.00    Pack years: 10.00    Types: Cigarettes  . Smokeless tobacco: Never Used  Vaping Use  . Vaping Use: Never used  Substance Use Topics  . Alcohol use: Yes    Alcohol/week: 12.0 standard drinks    Types: 12 Cans of beer per week    Comment: 12 beers daily  . Drug use: Yes    Frequency: 6.0 times per week    Types: Marijuana, Cocaine, Heroin, Fentanyl, IV    Comment: daily use cocaine (snort)    Home Medications Prior to Admission medications   Medication Sig Start Date End Date Taking? Authorizing Provider  albuterol (PROVENTIL HFA) 108 (90 Base) MCG/ACT inhaler Inhale 1-2 puffs into the lungs every 4 (four) hours as needed for wheezing or shortness of breath. 09/08/18   Wieters, Hallie C, PA-C  albuterol (PROVENTIL) (2.5 MG/3ML) 0.083% nebulizer solution Take 3 mLs (2.5 mg total) by nebulization every 6 (six) hours as needed for wheezing or shortness of breath. Patient not taking: Reported on 08/20/2019 09/08/18   Wieters, Fran Lowes C, PA-C  cloNIDine (CATAPRES) 0.1 MG tablet Take 1 tablet (0.1 mg total) by mouth 2 (two) times daily  in the am and at bedtime.. 08/20/19   Jae DireSegal, Jared E, MD  cloNIDine (CATAPRES) 0.1 MG tablet Take 1 tablet (0.1 mg total) by mouth daily before breakfast. 08/23/19   Jae DireSegal, Jared E, MD  ipratropium-albuterol (DUONEB) 0.5-2.5 (3) MG/3ML SOLN Take 3 mLs by nebulization every 6 (six) hours as needed (wheezing). Patient not taking: Reported on 12/28/2018 04/14/18 12/28/18  Elpidio AnisUpstill, Shari, PA-C  montelukast (SINGULAIR) 10 MG tablet Take 1 tablet (10 mg total) by mouth at bedtime. Patient not taking: Reported on 12/28/2018 07/25/17 12/28/18  Georgetta HaberBurky, Natalie B, NP  traZODone (DESYREL) 50 MG tablet Take 1  tablet (50 mg total) by mouth at bedtime and may repeat dose one time if needed. Patient not taking: Reported on 07/05/2018 04/29/16 12/28/18  Oneta RackLewis, Tanika N, NP    Allergies    Patient has no known allergies.  Review of Systems   Review of Systems  Constitutional: Positive for chills and fatigue. Negative for fever.  HENT: Negative for ear pain and sore throat.   Eyes: Negative for pain and visual disturbance.  Respiratory: Positive for shortness of breath. Negative for cough.   Cardiovascular: Negative for chest pain and palpitations.  Gastrointestinal: Negative for abdominal pain and vomiting.  Genitourinary: Negative for dysuria and hematuria.  Musculoskeletal: Positive for arthralgias, back pain and myalgias.  Skin: Positive for color change. Negative for rash.  Neurological: Negative for seizures and syncope.  All other systems reviewed and are negative.   Physical Exam Updated Vital Signs BP 107/70 (BP Location: Left Arm)   Pulse 87   Temp 98 F (36.7 C) (Oral)   Resp 18   Ht 5\' 8"  (1.727 m)   Wt 54 kg   SpO2 97%   BMI 18.09 kg/m   Physical Exam Vitals and nursing note reviewed.  Constitutional:      Appearance: He is well-developed.  HENT:     Head: Normocephalic and atraumatic.  Eyes:     Conjunctiva/sclera: Conjunctivae normal.  Cardiovascular:     Rate and Rhythm: Normal rate and regular rhythm.     Heart sounds: No murmur heard.   Pulmonary:     Effort: Pulmonary effort is normal. No respiratory distress.     Breath sounds: Normal breath sounds.  Abdominal:     Palpations: Abdomen is soft.     Tenderness: There is no abdominal tenderness.  Musculoskeletal:     Cervical back: Neck supple.     Comments: TTP across lumbar region, no focal midline tenderness; b/l lower extremity pitting edema  Skin:    General: Skin is warm and dry.     Capillary Refill: Capillary refill takes less than 2 seconds.  Neurological:     General: No focal deficit present.      Mental Status: He is alert and oriented to person, place, and time.  Psychiatric:        Mood and Affect: Mood normal.     ED Results / Procedures / Treatments   Labs (all labs ordered are listed, but only abnormal results are displayed) Labs Reviewed  CBC WITH DIFFERENTIAL/PLATELET - Abnormal; Notable for the following components:      Result Value   WBC 22.9 (*)    RBC 2.51 (*)    Hemoglobin 7.3 (*)    HCT 21.7 (*)    RDW 16.2 (*)    Neutro Abs 18.7 (*)    Abs Immature Granulocytes 0.44 (*)    All other components within normal limits  BASIC  METABOLIC PANEL - Abnormal; Notable for the following components:   Sodium 131 (*)    Calcium 7.5 (*)    All other components within normal limits  HEPATIC FUNCTION PANEL - Abnormal; Notable for the following components:   Albumin 1.5 (*)    AST 100 (*)    ALT 129 (*)    Alkaline Phosphatase 224 (*)    Total Bilirubin 1.4 (*)    Bilirubin, Direct 0.6 (*)    All other components within normal limits  BRAIN NATRIURETIC PEPTIDE - Abnormal; Notable for the following components:   B Natriuretic Peptide 526.1 (*)    All other components within normal limits  RESPIRATORY PANEL BY RT PCR (FLU A&B, COVID)  CULTURE, BLOOD (ROUTINE X 2)  CULTURE, BLOOD (ROUTINE X 2)  URINE CULTURE  LACTIC ACID, PLASMA  LACTIC ACID, PLASMA  URINALYSIS, ROUTINE W REFLEX MICROSCOPIC     EKG None  Radiology DG Chest 2 View  Result Date: 12/14/2019 CLINICAL DATA:  Shortness of breath. Reported history of endocarditis EXAM: CHEST - 2 VIEW COMPARISON:  August 18, 2019 FINDINGS: There is multifocal airspace opacity throughout the lungs bilaterally. There is suspected cavitation in several lesions. Heart is upper normal in size with pulmonary vascularity normal. No adenopathy. No bone lesions. IMPRESSION: Multifocal airspace opacity bilaterally with several areas of suspected cavitation. Concern for septic emboli given areas of suspected cavitation and history  of endocarditis. A degree of superimposed pneumonia and/or aspiration is also possible. Heart upper normal in size.  No adenopathy. Electronically Signed   By: Bretta Bang III M.D.   On: 12/14/2019 09:31    Procedures .Critical Care Performed by: Milagros Loll, MD Authorized by: Milagros Loll, MD   Critical care provider statement:    Critical care time (minutes):  33   Critical care was time spent personally by me on the following activities:  Discussions with consultants, evaluation of patient's response to treatment, examination of patient, ordering and performing treatments and interventions, ordering and review of laboratory studies, ordering and review of radiographic studies, pulse oximetry, re-evaluation of patient's condition, obtaining history from patient or surrogate and review of old charts   (including critical care time)   Medications Ordered in ED Medications  HYDROmorphone (DILAUDID) injection 1 mg (1 mg Intravenous Given 12/14/19 0822)  vancomycin (VANCOCIN) IVPB 1000 mg/200 mL premix (1,000 mg Intravenous New Bag/Given 12/14/19 6010)    ED Course  I have reviewed the triage vital signs and the nursing notes.  Pertinent labs & imaging results that were available during my care of the patient were reviewed by me and considered in my medical decision making (see chart for details).    MDM Rules/Calculators/A&P                         34 year old male history of endocarditis was just admitted to the hospital at [facility] for what he reports and his positive blood cultures and endocarditis.  His chest x-ray is concerning for septic pulmonary emboli, his blood work is concerning for profound leukocytosis.  His legs are edematous and his BNP is elevated, suspect heart failure related to his underlying valve disease.  Obtain blood cultures x2.  Consulted infectious disease, given patient's report of MRSA cultures and prior history of MRSA cultures, recommending  vancomycin for now.  Our Diplomatic Services operational officer is attempting to obtain records from [place].  Thankfully, patient's vital signs are currently stable and he is afebrile.  Will  admit to the hospitalist service for further management.  Dr. Dairl Ponder accepting.   Final Clinical Impression(s) / ED Diagnoses Final diagnoses:  Acute bacterial endocarditis  Leukocytosis, unspecified type  Multifocal pneumonia  Acute septic pulmonary embolism, unspecified whether acute cor pulmonale present [facility])    Rx / DC Orders ED Discharge Orders    None       Milagros Loll, MD 12/14/19 1015

## 2019-12-14 NOTE — ED Triage Notes (Signed)
Pt presents by [facility] for evaluation of leg pain and generalized pain.

## 2019-12-14 NOTE — Consult Note (Addendum)
Cardiology Consultation:   Patient ID: Alex [name] MRN: [id number]; DOB: [birth year]  Admit date: 12/14/2019 Date of Consult: 12/14/2019  Primary Care Provider: Patient, No Pcp Per [facility] Cardiologist: Sanda Klein, MD (new) [place] Electrophysiologist:  None    Patient Profile:   Alex [name] is a 34 y.o. male with a hx of TV endocarditis, polysubstance abuse with IVDA, alcohol use, incarceration, ADHD, depression with prior suicide attempt who is being seen today for the evaluation of endocarditis at the request of Dr. Neysa Bonito.  History of Present Illness:   [name] has not been formally evaluated by [facility] before in consultation but did have a prior admission 12/2018 for MRSA bacteremia and TEE showing small vegetation on the tricuspid leaflet with severe TR. UDS was positive for amphetamines. He was seen by Dr. Cyndia Bent who did not feel there was an indication for surgery at that time. During that admission the patient was also noted to be locking himself in his bathroom with repeat UDS showing + amphetamines, benzodiazepine and opiates with suspicion that patient was injecting medication into his port. He later admitted to doing so. He was changed to oral antibiotics at that time. Unfortunately he ended up leaving AMA from that admission. He also has history of cocaine on UDS. He was read[facility]/2019 from prison with AMS withdrawing from heroin and alcohol on presentation. UDS was also positive for amphetamines and benzos. He required intubation that admission. He also did report back pain but blood cultures and plain films were unremarkable.   The patient is a poor historian. He says he did not follow up with anyone after his 01/2019 admission for TV endocarditis. Per chart he was in [facility] recently for MRSA bacteremia, found to have septic pulmonary emboli and endocarditis. He indicates he was there for 5 days and left AMA because they wanted to transfer  him to [facility] and he did not want to go. Notes indicate he left 2 days ago. He does not presently wish to talk because of diffuse all-over-body pain and is asking for pain medication. His legs hurt the most per his report. He reports continued IV drug use. Labs show leukocytosis of 22.9k, anemia of 7.3, hyponatremia 131, abnormal LFTs (AST 100, ALT 129), albumin 1.5, alk phos 225, BNP 526, Covid panel negative. He is afebrile with stable VS otherwise. CXR shows multifocal airspace opacity bilaterally with several areas of suspected cavitation with concern for septic emboli given areas of cavitation and h/o endocarditis; a degree of superimposed pneumonia and/or aspiration is also possible. He has been started on IV vancomycin here.   Past Medical History:  Diagnosis Date  . ADHD (attention deficit hyperactivity disorder)   . Allergy   . Asthma   . Depressed   . 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 ETOH abuse   . History of suicidal tendencies   . Polysubstance abuse [facility])     Past Surgical History:  Procedure Laterality Date  . TEE WITHOUT CARDIOVERSION N/A 12/31/2018   Procedure: TRANSESOPHAGEAL ECHOCARDIOGRAM (TEE);  Surgeon: Sanda Klein, MD;  Location: [facility] ENDOSCOPY;  Service: Cardiovascular;  Laterality: N/A;     Home Medications:  Prior to Admission medications   Medication Sig Start Date End Date Taking? Authorizing Provider  albuterol (PROVENTIL HFA) 108 (90 Base) MCG/ACT inhaler Inhale 1-2 puffs into the lungs every 4 (four) hours as needed for wheezing or shortness of breath. 09/08/18   Wieters, Hallie C, PA-C  albuterol (PROVENTIL) (2.5 MG/3ML)  0.083% nebulizer solution Take 3 mLs (2.5 mg total) by nebulization every 6 (six) hours as needed for wheezing or shortness of breath. Patient not taking: Reported on 08/20/2019 09/08/18   Wieters, Madelynn Done C, PA-C  cloNIDine (CATAPRES) 0.1 MG tablet Take 1 tablet (0.1 mg total) by mouth 2 (two) times daily in the am and at bedtime..  08/20/19   Harold Hedge, MD  cloNIDine (CATAPRES) 0.1 MG tablet Take 1 tablet (0.1 mg total) by mouth daily before breakfast. 08/23/19   Harold Hedge, MD  ipratropium-albuterol (DUONEB) 0.5-2.5 (3) MG/3ML SOLN Take 3 mLs by nebulization every 6 (six) hours as needed (wheezing). Patient not taking: Reported on 12/28/2018 04/14/18 12/28/18  Charlann Lange, PA-C  montelukast (SINGULAIR) 10 MG tablet Take 1 tablet (10 mg total) by mouth at bedtime. Patient not taking: Reported on 12/28/2018 07/25/17 12/28/18  Zigmund Gottron, NP  traZODone (DESYREL) 50 MG tablet Take 1 tablet (50 mg total) by mouth at bedtime and may repeat dose one time if needed. Patient not taking: Reported on 07/05/2018 04/29/16 12/28/18  Derrill Center, NP    Inpatient Medications: Scheduled Meds: . furosemide  20 mg Intravenous Once  . sodium chloride flush  3 mL Intravenous Q12H   Continuous Infusions: . sodium chloride    . vancomycin     PRN Meds: sodium chloride, acetaminophen, morphine injection, ondansetron (ZOFRAN) IV, sodium chloride flush  Allergies:   No Known Allergies  Social History:   Social History   Socioeconomic History  . Marital status: Single    Spouse name: Not on file  . Number of children: Not on file  . Years of education: Not on file  . Highest education level: Not on file  Occupational History  . Not on file  Tobacco Use  . Smoking status: Current Every Day Smoker    Packs/day: 1.00    Years: 10.00    Pack years: 10.00    Types: Cigarettes  . Smokeless tobacco: Never Used  Vaping Use  . Vaping Use: Never used  Substance and Sexual Activity  . Alcohol use: Yes    Alcohol/week: 12.0 standard drinks    Types: 12 Cans of beer per week    Comment: 12 beers daily  . Drug use: Yes    Frequency: 6.0 times per week    Types: Marijuana, Cocaine, Heroin, Fentanyl, IV    Comment: daily use cocaine (snort)  . Sexual activity: Not Currently  Other Topics Concern  . Not on file    Social History Narrative  . Not on file   Social Determinants of Health   Financial Resource Strain:   . Difficulty of Paying Living Expenses: Not on file  Food Insecurity:   . Worried About Charity fundraiser in the Last Year: Not on file  . Ran Out of Food in the Last Year: Not on file  Transportation Needs:   . Lack of Transportation (Medical): Not on file  . Lack of Transportation (Non-Medical): Not on file  Physical Activity:   . Days of Exercise per Week: Not on file  . Minutes of Exercise per Session: Not on file  Stress:   . Feeling of Stress : Not on file  Social Connections:   . Frequency of Communication with Friends and Family: Not on file  . Frequency of Social Gatherings with Friends and Family: Not on file  . Attends Religious Services: Not on file  . Active Member of Clubs or Organizations: Not  on file  . Attends Archivist Meetings: Not on file  . Marital Status: Not on file  Intimate Partner Violence:   . Fear of Current or Ex-Partner: Not on file  . Emotionally Abused: Not on file  . Physically Abused: Not on file  . Sexually Abused: Not on file    Family History:   Family History  Problem Relation Age of Onset  . COPD Other      ROS:  Difficult to obtain fully as patient does not wish to engage in conversation at present time. Denies fevers, chills   Physical Exam/Data:   Vitals:   12/14/19 0330 12/14/19 0738 12/14/19 0739 12/14/19 1126  BP: 111/78  107/70 118/74  Pulse: 87  87 93  Resp: 18  18 17   Temp: 98.2 F (36.8 C) 98 F (36.7 C) 98 F (36.7 C)   TempSrc: Oral Oral Oral   SpO2: 99%  97% 97%  Weight: 54 kg     Height: 5' 8"  (1.727 m)       Intake/Output Summary (Last 24 hours) at 12/14/2019 1142 Last data filed at 12/14/2019 0857 Gross per 24 hour  Intake 0.43 ml  Output --  Net 0.43 ml   Last 3 Weights 12/14/2019 08/20/2019 08/19/2019  Weight (lbs) 119 lb 119 lb 14.4 oz 114 lb 11.2 oz  Weight (kg) 53.978 kg 54.386 kg  52.028 kg     Body mass index is 18.09 kg/m.  General: Ill appearing pale WM in NAD Head: Normocephalic, atraumatic, sclera non-icteric, no xanthomas, nares are without discharge. Neck: Bounding neck veins Lungs: Clear bilaterally to auscultation without wheezes, rales, or rhonchi. Breathing is unlabored. Heart: RRR, SEM in LLSB, no rubs gallops.  Abdomen: Soft, non-tender, non-distended with normoactive bowel sounds. No rebound/guarding. Extremities: No clubbing or cyanosis. 1+ BLE edema. Patient otherwise will not let you palpable his legs due to point tenderness diffusely, scattered excoriations noted Neuro: Follows commands, appears lethargic at times but easily arousable Psych: Flat/depressed affect  EKG:  The EKG was personally reviewed and demonstrates NSR 91bpm no acute STT changes, normal intervals  Telemetry:  Telemetry was personally reviewed and demonstrates: NSR  Relevant CV Studies: TEE 12/2018 FINDINGS:  Small vegetation on the anterior leaflet of the tricuspid valve. Severe tricuspid insufficiency. Otherwise normal TEE.  Laboratory Data:  High Sensitivity Troponin:  No results for input(s): TROPONINIHS in the last 720 hours.   Chemistry Recent Labs  Lab 12/14/19 0810  NA 131*  K 3.7  CL 100  CO2 23  GLUCOSE 91  BUN 17  CREATININE 0.97  CALCIUM 7.5*  GFRNONAA >60  GFRAA >60  ANIONGAP 8    Recent Labs  Lab 12/14/19 0810  PROT 6.6  ALBUMIN 1.5*  AST 100*  ALT 129*  ALKPHOS 224*  BILITOT 1.4*   Hematology Recent Labs  Lab 12/14/19 0810  WBC 22.9*  RBC 2.51*  HGB 7.3*  HCT 21.7*  MCV 86.5  MCH 29.1  MCHC 33.6  RDW 16.2*  PLT 315   BNP Recent Labs  Lab 12/14/19 0810  BNP 526.1*    DDimer No results for input(s): DDIMER in the last 168 hours.   Radiology/Studies:  DG Chest 2 View  Result Date: 12/14/2019 CLINICAL DATA:  Shortness of breath. Reported history of endocarditis EXAM: CHEST - 2 VIEW COMPARISON:  August 18, 2019 FINDINGS:  There is multifocal airspace opacity throughout the lungs bilaterally. There is suspected cavitation in several lesions. Heart is upper normal in  size with pulmonary vascularity normal. No adenopathy. No bone lesions. IMPRESSION: Multifocal airspace opacity bilaterally with several areas of suspected cavitation. Concern for septic emboli given areas of suspected cavitation and history of endocarditis. A degree of superimposed pneumonia and/or aspiration is also possible. Heart upper normal in size.  No adenopathy. Electronically Signed   By: Lowella Grip III M.D.   On: 12/14/2019 09:31   Assessment and Plan:   1. 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 Sallyanne Kuster, repeat transthoracic echocardiogram here to be able to compare side by side from 2020 imaging - currently on Vancomycin - will review plans with MD  2. IVDA - needs social work/rehab arranged   3. Depression and homelessness - to be managed in context of #2, per primary team  4. Anemia - acutely worsened from 10/2019, Hgb 11.3->7.3, IM following closely  For questions or updates, please contact [place] Please consult [email or web address] for contact info under    Signed, Charlie Pitter, PA-C  12/14/2019 11:42 AM  I have seen and examined the patient along with Charlie Pitter, PA-C .  I have reviewed the chart, notes and new data.  I agree with PA/NP's note.  Key new complaints: he does not want to talk to me Key examination changes: holosystolic murmur at left sternal border, mild pedal edema, appears chronically ill Key new findings / data: ECG w NSR and normal PR interval. CXR findings suggest septic pulmonary emboli. Severe anemia.  PLAN:  Low ESR in June suggests that he had resolved  infective endocarditis at that time. Now has marked leukocytosis, new anemia, cavitary pulmonary lesions new since June. Will need an updated echo to help distinguish healed old endocarditis lesions versus new vegetations and to evaluate for left heart valve involvement. Ongoing injectable drug abuse. Prognosis is grim even if he overcomes the current episode of endocarditis. I would not consider him a candidate for surgery, despite his young age, unless he completes a rehab program and proves extended abstinence.  Sanda Klein, MD, [place] [phone] 12/14/2019, 1:49 PM

## 2019-12-14 NOTE — ED Notes (Signed)
RN asked patient if there was anyone she could call to let them know patient was here. RN was informed to call patient's father and tell him where he was. RN called patient's father to let him know what hospital the patient was at.

## 2019-12-14 NOTE — Progress Notes (Signed)
  Echocardiogram 2D Echocardiogram has been performed.  Delcie Roch 12/14/2019, 3:37 PM

## 2019-12-14 NOTE — H&P (Signed)
History and Physical        Hospital Admission Note Date: 12/14/2019  Patient name: Alex [name] Medical record number: [id number] Date of birth: 05/08/1985 Age: 34 y.o. Gender: male  PCP: Patient, No Pcp Per   Chief Complaint    Chief Complaint  Patient presents with  . Fatigue      HPI:   This is a 34 year old male with a past medical history of alcohol abuse, polysubstance abuse, endocarditis of the tricuspid valve, depression, tobacco use who presented to the ED with worsening fatigue over the past several days.  He reports that 1 week ago he went to [facility] as he was feeling generally unwell and was found to have MRSA bacteremia and reported having septic pulmonary emboli and being diagnosed with endocarditis.  Unfortunately, he did not appreciate the care that he was receiving and decided to leave AMA about 2 days ago which was his last dose of antibiotics.  Reports that he was recently in jail several months ago and use IV drugs at that time and he is still using IV drugs.  Admits to significant bilateral leg swelling, shortness of breath, positional back pain.  ED Course: Afebrile and hemodynamically stable on room air.  Notable labs: Na 131, alk phos 224, albumin 1.5, AST 100, ALT 129, BNP 526, lactic acid 1.8, WBC 22.9, Hb 7.3 (previous 11.3 on 8/1), COVID-19 negative.  CXR concerning for septic emboli and possibly a superimposed pneumonia.  ED physician discussed with ID who recommended vancomycin for now.  ED secretary is attempting to obtain records from [place].  Vitals:   12/14/19 0738 12/14/19 0739  BP:  107/70  Pulse:  87  Resp:  18  Temp: 98 F (36.7 C) 98 F (36.7 C)  SpO2:  97%     Review of Systems:  Review of Systems  Constitutional: Positive for malaise/fatigue. Negative for fever.  Respiratory: Positive for shortness of breath.  Negative for wheezing.   Cardiovascular: Positive for leg swelling. Negative for chest pain.  Gastrointestinal: Negative for abdominal pain, nausea and vomiting.  Genitourinary: Negative for dysuria.  Musculoskeletal: Positive for back pain.  Neurological: Positive for weakness and headaches. Negative for focal weakness.  All other systems reviewed and are negative.   Medical/Social/Family History   Past Medical History: Past Medical History:  Diagnosis Date  . ADHD (attention deficit hyperactivity disorder)   . Allergy   . Asthma   . Depressed   . 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 ETOH abuse   . History of suicidal tendencies   . Polysubstance abuse [facility])     Past Surgical History:  Procedure Laterality Date  . TEE WITHOUT CARDIOVERSION N/A 12/31/2018   Procedure: TRANSESOPHAGEAL ECHOCARDIOGRAM (TEE);  Surgeon: Sanda Klein, MD;  Location: [facility] ENDOSCOPY;  Service: Cardiovascular;  Laterality: N/A;    Medications: Prior to Admission medications   Medication Sig Start Date End Date Taking? Authorizing Provider  albuterol (PROVENTIL HFA) 108 (90 Base) MCG/ACT inhaler Inhale 1-2 puffs into the lungs every 4 (four) hours as needed for wheezing or shortness of breath. 09/08/18   Wieters, Hallie C, PA-C  albuterol (PROVENTIL) (2.5 MG/3ML) 0.083% nebulizer solution Take 3 mLs (2.5 mg  total) by nebulization every 6 (six) hours as needed for wheezing or shortness of breath. Patient not taking: Reported on 08/20/2019 09/08/18   Wieters, Madelynn Done C, PA-C  cloNIDine (CATAPRES) 0.1 MG tablet Take 1 tablet (0.1 mg total) by mouth 2 (two) times daily in the am and at bedtime.. 08/20/19   Harold Hedge, MD  cloNIDine (CATAPRES) 0.1 MG tablet Take 1 tablet (0.1 mg total) by mouth daily before breakfast. 08/23/19   Harold Hedge, MD  ipratropium-albuterol (DUONEB) 0.5-2.5 (3) MG/3ML SOLN Take 3 mLs by nebulization every 6 (six) hours as needed (wheezing). Patient not taking: Reported on  12/28/2018 04/14/18 12/28/18  Charlann Lange, PA-C  montelukast (SINGULAIR) 10 MG tablet Take 1 tablet (10 mg total) by mouth at bedtime. Patient not taking: Reported on 12/28/2018 07/25/17 12/28/18  Zigmund Gottron, NP  traZODone (DESYREL) 50 MG tablet Take 1 tablet (50 mg total) by mouth at bedtime and may repeat dose one time if needed. Patient not taking: Reported on 07/05/2018 04/29/16 12/28/18  Derrill Center, NP    Allergies:  No Known Allergies  Social History:  reports that he has been smoking cigarettes. He has a 10.00 pack-year smoking history. He has never used smokeless tobacco. He reports current alcohol use of about 12.0 standard drinks of alcohol per week. He reports current drug use. Frequency: 6.00 times per week. Drugs: Marijuana, Cocaine, Heroin, Fentanyl, and IV.  Family History: Family History  Problem Relation Age of Onset  . COPD Other      Objective   Physical Exam: Blood pressure 107/70, pulse 87, temperature 98 F (36.7 C), temperature source Oral, resp. rate 18, height _0  (1.727 m), weight 54 kg, SpO2 97 %.  Physical Exam Vitals and nursing note reviewed.  Constitutional:      Appearance: He is toxic-appearing.  HENT:     Head: Normocephalic and atraumatic.  Eyes:     Conjunctiva/sclera: Conjunctivae normal.  Cardiovascular:     Rate and Rhythm: Regular rhythm. Tachycardia present.     Heart sounds: Murmur heard.      Comments: Bounding pulses with significant JVD and bounding neck veins Systolic murmur heard in mitral area Pulmonary:     Effort: No respiratory distress.     Breath sounds: No wheezing.  Abdominal:     General: Abdomen is flat. There is no distension.  Musculoskeletal:     Right lower leg: Edema present.     Left lower leg: Edema present.     Comments: Generalized tenderness to palpation No obvious evidence for hand or foot emboli but his hands and feet are not clean and obstructing good visualization.  Skin:     Coloration: Skin is pale.     Findings: No rash.  Neurological:     Mental Status: He is alert and oriented to person, place, and time.  Psychiatric:        Thought Content: Thought content normal.     LABS on Admission: I have personally reviewed all the labs and imaging below    Basic Metabolic Panel: Recent Labs  Lab 12/14/19 0810  NA 131*  K 3.7  CL 100  CO2 23  GLUCOSE 91  BUN 17  CREATININE 0.97  CALCIUM 7.5*   Liver Function Tests: Recent Labs  Lab 12/14/19 0810  AST 100*  ALT 129*  ALKPHOS 224*  BILITOT 1.4*  PROT 6.6  ALBUMIN 1.5*   No results for input(s): LIPASE, AMYLASE in the last 168 hours.  No results for input(s): AMMONIA in the last 168 hours. CBC: Recent Labs  Lab 12/14/19 0810  WBC 22.9*  NEUTROABS 18.7*  HGB 7.3*  HCT 21.7*  MCV 86.5  PLT 315   Cardiac Enzymes: No results for input(s): CKTOTAL, CKMB, CKMBINDEX, TROPONINI in the last 168 hours. BNP: Invalid input(s): POCBNP CBG: No results for input(s): GLUCAP in the last 168 hours.  Radiological Exams on Admission:  DG Chest 2 View  Result Date: 12/14/2019 CLINICAL DATA:  Shortness of breath. Reported history of endocarditis EXAM: CHEST - 2 VIEW COMPARISON:  August 18, 2019 FINDINGS: There is multifocal airspace opacity throughout the lungs bilaterally. There is suspected cavitation in several lesions. Heart is upper normal in size with pulmonary vascularity normal. No adenopathy. No bone lesions. IMPRESSION: Multifocal airspace opacity bilaterally with several areas of suspected cavitation. Concern for septic emboli given areas of suspected cavitation and history of endocarditis. A degree of superimposed pneumonia and/or aspiration is also possible. Heart upper normal in size.  No adenopathy. Electronically Signed   By: Lowella Grip III M.D.   On: 12/14/2019 09:31      EKG: Not done   A & P   Principal Problem:   Endocarditis Active Problems:   Polysubstance dependence  ([place])   IVDU (intravenous drug user)   Acute CHF (congestive heart failure) (HCC)   Anemia   1. Suspected bacterial endocarditis with septic pulmonary emboli a. Afebrile and hemodynamically stable on room air b. Has a history of bacterial endocarditis and IV drug use c. Reported recent diagnosis of MRSA bacteremia/endocarditis at [facility] -currently awaiting records for confirmation d. Vancomycin for now e. ID consulted f. EKG g. Telemetry h. Cardiology consulted  2. Heart failure suspect secondary to above a. BNP 526 with bounding neck veins and JVD b. Awaiting echo results from [facility] c. Lasix IV x1 d. Daily weights and I's/O e. Cardiology consulted  3. Acute on chronic normocytic anemia a. Hb 11.3 in August ->7.3 today b. Check iron studies c. Trend H&H and transfuse if <7.0.  Will likely need Lasix with transfusion  4. Polysubstance abuse a. Not sure when he last used b. Encourage cessation   DVT prophylaxis: SCDs   Code Status: Full Code  Diet: NPO Family Communication: Admission, patients condition and plan of care including tests being ordered have been discussed with the patient who indicates understanding and agrees with the plan and Code Status.  Disposition Plan: The appropriate patient status for this patient is INPATIENT. Inpatient status is judged to be reasonable and necessary in order to provide the required intensity of service to ensure the patient's safety. The patient's presenting symptoms, physical exam findings, and initial radiographic and laboratory data in the context of their chronic comorbidities is felt to place them at high risk for further clinical deterioration. Furthermore, it is not anticipated that the patient will be medically stable for discharge from the hospital within 2 midnights of admission. The following factors support the patient status of inpatient.   " The patient's presenting symptoms include shortness of breath,  fatigue. " The worrisome physical exam findings include lower extremity edema, JVD, pale. " The initial radiographic and laboratory data are worrisome because of elevated BNP, leukocytosis, septic pulmonary emboli. " The chronic co-morbidities include IV drug use, prior endocarditis.   * I certify that at the point of admission it is my clinical judgment that the patient will require inpatient hospital care spanning beyond 2 midnights from the point of  admission due to high intensity of service, high risk for further deterioration and high frequency of surveillance required.*   Status is: Inpatient  Remains inpatient appropriate because:IV treatments appropriate due to intensity of illness or inability to take PO and Inpatient level of care appropriate due to severity of illness   Dispo: The patient is from: Home              Anticipated d/c is to: Home              Anticipated d/c date is: > 3 days              Patient currently is not medically stable to d/c.         The medical decision making on this patient was of high complexity and the patient is at high risk for clinical deterioration, therefore this is a level 3 admission.  Consultants  . Cardiology . Infectious disease  Procedures  . None  Time Spent on Admission: 72 minutes    Harold Hedge, DO [facility]  12/14/2019, 11:23 AM

## 2019-12-14 NOTE — ED Notes (Signed)
Zosyn d/c, vanc started

## 2019-12-14 NOTE — Plan of Care (Signed)
  Problem: Education: Goal: Knowledge of General Education information will improve Description: Including pain rating scale, medication(s)/side effects and non-pharmacologic comfort measures Outcome: Progressing   Problem: Clinical Measurements: Goal: Will remain free from infection Outcome: Progressing Goal: Respiratory complications will improve Outcome: Progressing   Problem: Nutrition: Goal: Adequate nutrition will be maintained Outcome: Progressing   Problem: Coping: Goal: Level of anxiety will decrease Outcome: Progressing   Problem: Elimination: Goal: Will not experience complications related to urinary retention Outcome: Progressing   Problem: Pain Managment: Goal: General experience of comfort will improve Outcome: Progressing

## 2019-12-15 ENCOUNTER — Telehealth: Payer: Self-pay | Admitting: Physician Assistant

## 2019-12-15 DIAGNOSIS — D508 Other iron deficiency anemias: Secondary | ICD-10-CM

## 2019-12-15 DIAGNOSIS — I361 Nonrheumatic tricuspid (valve) insufficiency: Secondary | ICD-10-CM

## 2019-12-15 DIAGNOSIS — I2690 Septic pulmonary embolism without acute cor pulmonale: Secondary | ICD-10-CM

## 2019-12-15 LAB — BLOOD CULTURE ID PANEL (REFLEXED) - BCID2

## 2019-12-15 LAB — BASIC METABOLIC PANEL
Anion gap: 10 (ref 5–15)
BUN: 16 mg/dL (ref 6–20)
CO2: 21 mmol/L — ABNORMAL LOW (ref 22–32)
Calcium: 7.3 mg/dL — ABNORMAL LOW (ref 8.9–10.3)
Chloride: 100 mmol/L (ref 98–111)
Creatinine, Ser: 0.89 mg/dL (ref 0.61–1.24)
GFR calc Af Amer: >60 mL/min (ref >60)
GFR calc non Af Amer: >60 mL/min (ref >60)
Glucose, Bld: 86 mg/dL (ref 70–99)
Potassium: 4.1 mmol/L (ref 3.5–5.1)
Sodium: 131 mmol/L — ABNORMAL LOW (ref 135–145)

## 2019-12-15 LAB — CBC
HCT: 27.9 % — ABNORMAL LOW (ref 39.0–52.0)
Hemoglobin: 8 g/dL — ABNORMAL LOW (ref 13.0–17.0)
MCH: 28.2 pg (ref 26.0–34.0)
MCHC: 28.7 g/dL — ABNORMAL LOW (ref 30.0–36.0)
MCV: 98.2 fL (ref 80.0–100.0)
Platelets: 432 10*3/uL — ABNORMAL HIGH (ref 150–400)
RBC: 2.84 MIL/uL — ABNORMAL LOW (ref 4.22–5.81)
RDW: 17.6 % — ABNORMAL HIGH (ref 11.5–15.5)
WBC: 28.7 10*3/uL — ABNORMAL HIGH (ref 4.0–10.5)
nRBC: 0.1 % (ref 0.0–0.2)

## 2019-12-15 LAB — ETHANOL: Alcohol, Ethyl (B): <10 mg/dL (ref <10)

## 2019-12-15 LAB — URINE CULTURE: Culture: NO GROWTH

## 2019-12-15 LAB — RAPID URINE DRUG SCREEN, HOSP PERFORMED
Amphetamines: POSITIVE — AB
Barbiturates: NOT DETECTED
Benzodiazepines: NOT DETECTED
Cocaine: NOT DETECTED
Opiates: POSITIVE — AB
Tetrahydrocannabinol: NOT DETECTED

## 2019-12-15 MED ORDER — ASCORBIC ACID 500 MG PO TABS
500.0000 mg | ORAL_TABLET | Freq: Every day | ORAL | Status: DC
  Administered 2019-12-15 – 2019-12-18 (×4): 500 mg via ORAL
  Filled 2019-12-15 (×4): qty 1

## 2019-12-15 MED ORDER — ALBUTEROL SULFATE (2.5 MG/3ML) 0.083% IN NEBU
2.5000 mg | INHALATION_SOLUTION | RESPIRATORY_TRACT | Status: DC | PRN
  Administered 2019-12-15 (×2): 2.5 mg via RESPIRATORY_TRACT
  Filled 2019-12-15 (×2): qty 3

## 2019-12-15 MED ORDER — FERROUS SULFATE 325 (65 FE) MG PO TABS
325.0000 mg | ORAL_TABLET | Freq: Two times a day (BID) | ORAL | Status: DC
  Administered 2019-12-16 – 2019-12-18 (×5): 325 mg via ORAL
  Filled 2019-12-15 (×5): qty 1

## 2019-12-15 MED ORDER — ENOXAPARIN SODIUM 40 MG/0.4ML ~~LOC~~ SOLN
40.0000 mg | SUBCUTANEOUS | Status: DC
  Administered 2019-12-15 – 2020-01-04 (×12): 40 mg via SUBCUTANEOUS
  Filled 2019-12-15 (×26): qty 0.4

## 2019-12-15 NOTE — Plan of Care (Signed)
  Problem: Nutrition: Goal: Adequate nutrition will be maintained Outcome: Progressing   Problem: Elimination: Goal: Will not experience complications related to urinary retention Outcome: Progressing   

## 2019-12-15 NOTE — Telephone Encounter (Signed)
Noted, thanks!

## 2019-12-15 NOTE — Progress Notes (Signed)
[facility]    Date of Admission:  12/14/2019   Total days of antibiotics 2   ID: Alex [name] is a 34 y.o. male with  MRSA TV endocarditis and opiate dependence Principal Problem:   Endocarditis Active Problems:   Polysubstance dependence (HCC)   IVDU (intravenous drug user)   Acute CHF (congestive heart failure) (HCC)   Anemia    Subjective: Still having diffuse pain, but slightly improved. Used an alias at outside hospital, now reports that he didn't have spine mri  Medications:  . melatonin  3 mg Oral QHS  . sodium chloride flush  3 mL Intravenous Q12H    Objective: Vital signs in last 24 hours: Temp:  [98 F (36.7 C)-100.6 F (38.1 C)] 99.8 F (37.7 C) (10/05 1142) Pulse Rate:  [85-100] 89 (10/05 1142) Resp:  [14-18] 14 (10/05 1142) BP: (92-102)/(63-74) 92/65 (10/05 1142) SpO2:  [95 %-98 %] 98 % (10/05 1142) Weight:  [56.7 kg] 56.7 kg (10/05 0500)  Physical Exam  Constitutional: He is oriented to person, place, and time. He appears well-developed and well-nourished. No distress.  HENT:  Mouth/Throat: Oropharynx is clear and moist. No oropharyngeal exudate.  Cardiovascular: Normal rate, regular rhythm and normal heart sounds. +murmur Neck: jvp elevated to angle of jaw Pulmonary/Chest: Effort normal and breath sounds normal. No respiratory distress. He has no wheezes.  Abdominal: Soft. Bowel sounds are normal. He exhibits no distension. There is no tenderness.  Lymphadenopathy:  He has no cervical adenopathy.  Neurological: He is alert and oriented to person, place, and time.  Skin: Skin is warm and dry. No rash noted. No erythema.  Psychiatric: He has a normal mood and affect. His behavior is normal.     Lab Results Recent Labs    12/14/19 0810 12/14/19 1252 12/14/19 1735 12/15/19 0518  WBC 22.9*  --   --  28.7*  HGB 7.3*   < > 7.1* 8.0*  HCT 21.7*   < > 22.1* 27.9*  NA 131*  --   --  131*  K 3.7  --   --  4.1  CL 100   --   --  100  CO2 23  --   --  21*  BUN 17  --   --  16  CREATININE 0.97  --   --  0.89   < > = values in this interval not displayed.   Liver Panel Recent Labs    12/14/19 0810  PROT 6.6  ALBUMIN 1.5*  AST 100*  ALT 129*  ALKPHOS 224*  BILITOT 1.4*  BILIDIR 0.6*  IBILI 0.8    Microbiology: +mrsa blood cx on 1/04 Studies/Results: DG Chest 2 View  Result Date: 12/14/2019 CLINICAL DATA:  Shortness of breath. Reported history of endocarditis EXAM: CHEST - 2 VIEW COMPARISON:  August 18, 2019 FINDINGS: There is multifocal airspace opacity throughout the lungs bilaterally. There is suspected cavitation in several lesions. Heart is upper normal in size with pulmonary vascularity normal. No adenopathy. No bone lesions. IMPRESSION: Multifocal airspace opacity bilaterally with several areas of suspected cavitation. Concern for septic emboli given areas of suspected cavitation and history of endocarditis. A degree of superimposed pneumonia and/or aspiration is also possible. Heart upper normal in size.  No adenopathy. Electronically Signed   By: Bretta Bang III M.D.   On: 12/14/2019 09:31   ECHOCARDIOGRAM COMPLETE  Result Date: 12/14/2019    ECHOCARDIOGRAM REPORT   Patient Name:   Alex  Leanna [name] Date of Exam: 12/14/2019 Medical Rec #:  [id number]      Height:       68.0 in Accession #:    [id number]     Weight:       119.0 lb Date of Birth:  1985-06-25      BSA:          1.639 m Patient Age:    34 years       BP:           98/64 mmHg Patient Gender: M              HR:           92 bpm. Exam Location:  Inpatient Procedure: 2D Echo Indications:    endocarditis  History:        Patient has prior history of Echocardiogram examinations, most                 recent 12/31/2018. CHF, Endocarditis; Risk Factors:IV drug use.  Sonographer:    Delcie Roch Referring Phys: [id number] DAYNA N DUNN IMPRESSIONS  1. Left ventricular ejection fraction, by estimation, is 60 to 65%. The left ventricle has normal  function. The left ventricle has no regional wall motion abnormalities. Left ventricular diastolic parameters were normal.  2. Right ventricular systolic function is mildly reduced. The right ventricular size is severely enlarged. There is mildly elevated pulmonary artery systolic pressure.  3. Right atrial size was severely dilated.  4. A small pericardial effusion is present. The pericardial effusion is posterior to the left ventricle.  5. The mitral valve is normal in structure. Trivial mitral valve regurgitation. No evidence of mitral stenosis.  6. Large mobile vegetation on the septal and lateral leaflet with leaflet destruction and failure to coapt leading to wide open TR. The tricuspid valve is abnormal. Tricuspid valve regurgitation is severe.  7. The aortic valve is normal in structure. Aortic valve regurgitation is not visualized. No aortic stenosis is present.  8. The inferior vena cava is dilated in size with <50% respiratory variability, suggesting right atrial pressure of 15 mmHg. FINDINGS  Left Ventricle: Left ventricular ejection fraction, by estimation, is 60 to 65%. The left ventricle has normal function. The left ventricle has no regional wall motion abnormalities. The left ventricular internal cavity size was normal in size. There is  no left ventricular hypertrophy. Left ventricular diastolic parameters were normal. Right Ventricle: The right ventricular size is severely enlarged. No increase in right ventricular wall thickness. Right ventricular systolic function is mildly reduced. There is mildly elevated pulmonary artery systolic pressure. The tricuspid regurgitant velocity is 2.70 m/s, and with an assumed right atrial pressure of 15 mmHg, the estimated right ventricular systolic pressure is 44.2 mmHg. Left Atrium: Left atrial size was normal in size. Right Atrium: Right atrial size was severely dilated. Pericardium: A small pericardial effusion is present. The pericardial effusion is  posterior to the left ventricle. Mitral Valve: The mitral valve is normal in structure. Trivial mitral valve regurgitation. No evidence of mitral valve stenosis. Tricuspid Valve: Large mobile vegetation on the septal and lateral leaflet with leaflet destruction and failure to coapt leading to wide open TR. The tricuspid valve is abnormal. Tricuspid valve regurgitation is severe. No evidence of tricuspid stenosis. Aortic Valve: The aortic valve is normal in structure. Aortic valve regurgitation is not visualized. No aortic stenosis is present. Pulmonic Valve: The pulmonic valve was normal in structure. Pulmonic valve regurgitation is trivial. No evidence of  pulmonic stenosis. Aorta: The aortic root is normal in size and structure. Venous: The inferior vena cava is dilated in size with less than 50% respiratory variability, suggesting right atrial pressure of 15 mmHg. IAS/Shunts: No atrial level shunt detected by color flow Doppler.  LEFT VENTRICLE PLAX 2D LVIDd:         4.45 cm  Diastology LVIDs:         2.77 cm  LV e' medial:    12.50 cm/s LV PW:         0.98 cm  LV E/e' medial:  4.8 LV IVS:        0.91 cm  LV e' lateral:   16.80 cm/s LVOT diam:     1.70 cm  LV E/e' lateral: 3.6 LV SV:         29 LV SV Index:   17 LVOT Area:     2.27 cm  RIGHT VENTRICLE             IVC RV S prime:     15.80 cm/s  IVC diam: 2.71 cm TAPSE (M-mode): 2.0 cm LEFT ATRIUM             Index LA diam:        3.50 cm 2.14 cm/m LA Vol (A2C):   53.4 ml 32.58 ml/m LA Vol (A4C):   39.3 ml 23.98 ml/m LA Biplane Vol: 48.0 ml 29.29 ml/m  AORTIC VALVE LVOT Vmax:   80.80 cm/s LVOT Vmean:  49.100 cm/s LVOT VTI:    0.126 m  AORTA Ao Root diam: 2.80 cm Ao Asc diam:  2.70 cm MITRAL VALVE               TRICUSPID VALVE MV Area (PHT): 4.68 cm    TR Peak grad:   29.2 mmHg MV Decel Time: 162 msec    TR Vmax:        270.00 cm/s MV E velocity: 60.20 cm/s MV A velocity: 49.80 cm/s  SHUNTS MV E/A ratio:  1.21        Systemic VTI:  0.13 m                             Systemic Diam: 1.70 cm Charlton Haws MD Electronically signed by Charlton Haws MD Signature Date/Time: 12/14/2019/3:58:33 PM    Final      Assessment/Plan: 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 Repeat blood cx tomorrow. Recommend imaging of thoracic lumbar spine, but will need some sedation for pain management. Attempting to find outside records. Discussed with dr Joseph Art to see if CT surgery can evaluate TEE to see if candidate for angiovac of large TV lesions to prevent further embolization  Opiate dependence = defer to primary team for management of withdrawal/dependence  transaminitis = please check hep c ab  [facility] Cell: [phone] Pager: [phone]  12/15/2019, 6:11 PM

## 2019-12-15 NOTE — Progress Notes (Signed)
PROGRESS NOTE    Alex [name]  UKG:[id number] DOB: 1985-08-22 DOA: 12/14/2019 PCP: Patient, No Pcp Per     Brief Narrative:  This is a 35 year old WM PMHx Depression, Hx Suicidal Tendencies, EtOH abuse, Polysubstance abuse, Endocarditis of the tricuspid valve, Tobacco abuse,   Presented to the ED with worsening fatigue over the past several days.  He reports that 1 week ago he went to [facility] as he was feeling generally unwell and was found to have MRSA bacteremia and reported having septic pulmonary emboli and being diagnosed with endocarditis.  Unfortunately, he did not appreciate the care that he was receiving and decided to leave AMA about 2 days ago which was his last dose of antibiotics.  Reports that he was recently in jail several months ago and use IV drugs at that time and he is still using IV drugs.  Admits to significant bilateral leg swelling, shortness of breath, positional back pain.  ED Course: Afebrile and hemodynamically stable on room air.  Notable labs: Na 131, alk phos 224, albumin 1.5, AST 100, ALT 129, BNP 526, lactic acid 1.8, WBC 22.9, Hb 7.3 (previous 11.3 on 8/1), COVID-19 negative.  CXR concerning for septic emboli and possibly a superimposed pneumonia.  ED physician discussed with ID who recommended vancomycin for now.  ED secretary is attempting to obtain records from [name].   Subjective: A/O x4, cachectic.  Patient states he was at [name] to the staff about his name told him his name was Alex [name] he thinks he told him he was born 05/05/1981 but he is not sure.  He thinks he received a Covid vaccination but is not sure.  The only thing patient is sure is that he did not receive MRI of his spine.   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  We will attempt moderate sedation initially in order to save her trip over to [facility] although not sure will work given patient's drug use. -MRI L-spine under moderate sedation -MRI thoracic spine under moderate sedation -10/6 contacted CT surgery and have them evaluate TEE to see if candidate for angioVAC of large TV lesion to prevent further embolization.  Mixed anemia/iron deficiency anemia + microcytic anemia  -10/4 anemia panel FE/TIBC ratio 0.14 consistent with iron deficiency anemia -10/5 iron sulfate 325 mg BID -10/5 vitamin C 500 mg daily  Goals of care -10/5 Palliative Care; long time IV drug user long, user of cocaine laced with fentanyl change patient's CODE STATUS to DNR discussed hospice WHEN patient's heart fails or patient has stroke, HCPOA    DVT prophylaxis: Lovenox Code Status: Full Family Communication:  Status is: Inpatient    Dispo: The patient is from: Home              Anticipated d/c is to: Home              Anticipated d/c date is: 6  weeks day minimal              Patient currently unstable      Consultants:  Cardiology ID   Procedures/Significant Events:  10/4 echocardiogram;LVEF=60 to 65%. Left ventricular diastolic parameters  were normal.  Right Ventricle: severely enlarged. Right Atrium: severely dilated.  Pericardium: A small pericardial effusion is present.  Tricuspid Valve: Large mobile vegetation on the septal and lateral leaflet with leaflet destruction and failure to coapt  leading to wide open TR.  Tricuspid valve regurgitation is severe.    I have personally reviewed and interpreted all radiology studies and my findings are as above.  VENTILATOR SETTINGS:    Cultures 10/4 SARS Corononavirus Negative  Antimicrobials: Anti-infectives (From admission, onward)   Start     Ordered Stop   12/14/19 1800  vancomycin (VANCOREADY) IVPB 750 mg/150 mL        12/14/19 1103     12/14/19 0900  vancomycin (VANCOCIN) IVPB 1000 mg/200 mL premix        12/14/19 0821 12/14/19 1142   12/14/19 0830  piperacillin-tazobactam (ZOSYN) IVPB 3.375 g  Status:  Discontinued        12/14/19 0821 12/14/19 0855       Devices    LINES / TUBES:      Continuous Infusions: . sodium chloride    . vancomycin 150 mL/hr at 12/15/19 0600     Objective: Vitals:   12/15/19 0130 12/15/19 0400 12/15/19 0500 12/15/19 0536  BP: 99/74   102/67  Pulse: 85   100  Resp: 18   18  Temp: 98 F (36.7 C)   (!) 100.6 F (38.1 C)  TempSrc: Oral   Oral  SpO2: 95% 96%    Weight:   56.7 kg   Height:        Intake/Output Summary (Last 24 hours) at 12/15/2019 8315 Last data filed at 12/15/2019 0600 Gross per 24 hour  Intake 719.65 ml  Output 1250 ml  Net -530.35 ml   Filed Weights   12/14/19 0330 12/15/19 0500  Weight: 54 kg 56.7 kg    Examination:  General: A/O x4, No acute respiratory distress, cachectic Eyes: negative scleral hemorrhage, negative anisocoria, negative icterus ENT: Negative Runny nose, negative gingival bleeding, Neck:  Negative scars, masses, torticollis, lymphadenopathy, JVD Lungs: Clear to auscultation bilaterally without wheezes or crackles Cardiovascular: Regular rate and rhythm without murmur gallop or rub normal S1 and S2 Abdomen: negative abdominal pain, nondistended, positive soft, bowel sounds, no rebound, no ascites, no appreciable mass Extremities: No significant cyanosis, clubbing, or edema bilateral lower extremities Skin: Negative rashes,  lesions, ulcers Psychiatric:  Negative depression, negative anxiety, negative fatigue, negative mania, manipulative, untrustworthy as evidence by using false name at [facility] nervous system:  Cranial nerves II through XII intact, tongue/uvula midline, all extremities muscle strength 5/5, sensation intact throughout, negative dysarthria, negative expressive aphasia, negative receptive aphasia.  .     Data Reviewed: Care during the described time interval was provided by me .  I have reviewed this patient's available data, including medical history, events of note, physical examination, and all test results as part of my evaluation.  CBC: Recent Labs  Lab 12/14/19 0810 12/14/19 1252 12/14/19 1735 12/15/19 0518  WBC 22.9*  --   --  28.7*  NEUTROABS 18.7*  --   --   --   HGB 7.3* 7.6* 7.1* 8.0*  HCT 21.7* 23.7* 22.1* 27.9*  MCV 86.5  --   --  98.2  PLT 315  --   --  481*   Basic Metabolic Panel: Recent Labs  Lab 12/14/19 0810 12/15/19 0518  NA 131* 131*  K 3.7 4.1  CL 100 100  CO2 23 21*  GLUCOSE 91 86  BUN 17 16  CREATININE 0.97 0.89  CALCIUM 7.5* 7.3*   GFR: Estimated Creatinine Clearance: 93.8 mL/min (by C-G formula based on SCr of 0.89 mg/dL). Liver Function Tests: Recent Labs  Lab 12/14/19 0810  AST 100*  ALT 129*  ALKPHOS 224*  BILITOT 1.4*  PROT 6.6  ALBUMIN 1.5*   No results for input(s): LIPASE, AMYLASE in the last 168 hours. No results for input(s): AMMONIA in the last 168 hours. Coagulation Profile: No results for input(s): INR, PROTIME in the last 168 hours. Cardiac Enzymes: No results for input(s): CKTOTAL, CKMB, CKMBINDEX, TROPONINI in the last 168 hours. BNP (last 3 results) No results for input(s): PROBNP in the last 8760 hours. HbA1C: No results for input(s): HGBA1C in the last 72 hours. CBG: No results for input(s): GLUCAP in the last 168 hours. Lipid Profile: No results for input(s): CHOL, HDL, LDLCALC, TRIG, CHOLHDL,  LDLDIRECT in the last 72 hours. Thyroid Function Tests: No results for input(s): TSH, T4TOTAL, FREET4, T3FREE, THYROIDAB in the last 72 hours. Anemia Panel: Recent Labs    12/14/19 1252  FERRITIN 579*  TIBC 202*  IRON 30*   Sepsis Labs: Recent Labs  Lab 12/14/19 0810 12/14/19 1252  LATICACIDVEN 1.8 2.2*    Recent Results (from the past 240 hour(s))  Blood culture (routine x 2)     Status: None (Preliminary result)   Collection Time: 12/14/19  7:56 AM   Specimen: BLOOD  Result Value Ref Range Status   Specimen Description   Final    BLOOD RIGHT ANTECUBITAL Performed at [facility], [place] [address] [place], [place] [address]    Special Requests   Final    BOTTLES DRAWN AEROBIC AND ANAEROBIC Blood Culture results may not be optimal due to an excessive volume of blood received in culture bottles Performed at [facility] [address] [place], [place] [address]    Culture  Setup Time   Final    GRAM POSITIVE COCCI AEROBIC BOTTLE ONLY CRITICAL VALUE NOTED.  VALUE IS CONSISTENT WITH PREVIOUSLY REPORTED AND CALLED VALUE. Performed at [facility], [place] [address], [place], [place] [address]    Culture GRAM NEGATIVE COCCI  Final   Report Status PENDING  Incomplete  Blood culture (routine x 2)     Status: None (Preliminary result)   Collection Time: 12/14/19  8:09 AM   Specimen: BLOOD  Result Value Ref Range Status   Specimen Description   Final    BLOOD LEFT ANTECUBITAL Performed at [facility], [place] [address], [place], [place] [address]    Special Requests   Final    BOTTLES DRAWN AEROBIC AND ANAEROBIC Blood Culture adequate volume Performed at [place] [address], [place], [place] [address]    Culture  Setup Time   Final    GRAM POSITIVE COCCI IN BOTH AEROBIC AND ANAEROBIC BOTTLES CRITICAL RESULT CALLED TO, READ BACK BY AND VERIFIED WITH: PHARMD E JACKSON 12/15/19 AT 0044 SK Performed  at [facility], [place] [address] [place], [place] [address]    Culture [facility] POSITIVE COCCI  Final   Report Status PENDING  Incomplete  Blood Culture ID Panel (Reflexed)  Status: Abnormal   Collection Time: 12/14/19  8:09 AM  Result Value Ref Range Status   Enterococcus faecalis NOT DETECTED NOT DETECTED Final   Enterococcus Faecium NOT DETECTED NOT DETECTED Final   Listeria monocytogenes NOT DETECTED NOT DETECTED Final   Staphylococcus species DETECTED (A) NOT DETECTED Final    Comment: CRITICAL RESULT CALLED TO, READ BACK BY AND VERIFIED WITH: PHARMD E JACKSON 12/15/19 AT 0044 SK    Staphylococcus aureus (BCID) DETECTED (A) NOT DETECTED Final    Comment: Methicillin (oxacillin)-resistant Staphylococcus aureus (MRSA). MRSA is predictably resistant to beta-lactam antibiotics (except ceftaroline). Preferred therapy is vancomycin unless clinically contraindicated. Patient requires contact precautions if  hospitalized. CRITICAL RESULT CALLED TO, READ BACK BY AND VERIFIED WITH: PHARMD E JACKSON 12/15/19 AT 0044 SK    Staphylococcus epidermidis NOT DETECTED NOT DETECTED Final   Staphylococcus lugdunensis NOT DETECTED NOT DETECTED Final   Streptococcus species NOT DETECTED NOT DETECTED Final   Streptococcus agalactiae NOT DETECTED NOT DETECTED Final   Streptococcus pneumoniae NOT DETECTED NOT DETECTED Final   Streptococcus pyogenes NOT DETECTED NOT DETECTED Final   A.calcoaceticus-baumannii NOT DETECTED NOT DETECTED Final   Bacteroides fragilis NOT DETECTED NOT DETECTED Final   Enterobacterales NOT DETECTED NOT DETECTED Final   Enterobacter cloacae complex NOT DETECTED NOT DETECTED Final   Escherichia coli NOT DETECTED NOT DETECTED Final   Klebsiella aerogenes NOT DETECTED NOT DETECTED Final   Klebsiella oxytoca NOT DETECTED NOT DETECTED Final   Klebsiella pneumoniae NOT DETECTED NOT DETECTED Final   Proteus species NOT DETECTED NOT DETECTED Final   Salmonella species NOT  DETECTED NOT DETECTED Final   Serratia marcescens NOT DETECTED NOT DETECTED Final   Haemophilus influenzae NOT DETECTED NOT DETECTED Final   Neisseria meningitidis NOT DETECTED NOT DETECTED Final   Pseudomonas aeruginosa NOT DETECTED NOT DETECTED Final   Stenotrophomonas maltophilia NOT DETECTED NOT DETECTED Final   Candida albicans NOT DETECTED NOT DETECTED Final   Candida auris NOT DETECTED NOT DETECTED Final   Candida glabrata NOT DETECTED NOT DETECTED Final   Candida krusei NOT DETECTED NOT DETECTED Final   Candida parapsilosis NOT DETECTED NOT DETECTED Final   Candida tropicalis NOT DETECTED NOT DETECTED Final   Cryptococcus neoformans/gattii NOT DETECTED NOT DETECTED Final   Meth resistant mecA/C and MREJ DETECTED (A) NOT DETECTED Final    Comment: CRITICAL RESULT CALLED TO, READ BACK BY AND VERIFIED WITH: PHARMD E JACKSON 12/15/19 AT 0044 SK Performed at [facility], [address], [place], [place] [address]   Respiratory Panel by RT PCR (Flu A&B, Covid) - Nasopharyngeal Swab     Status: None   Collection Time: 12/14/19  8:10 AM   Specimen: Nasopharyngeal Swab  Result Value Ref Range Status   SARS Coronavirus 2 by RT PCR NEGATIVE NEGATIVE Final    Comment: (NOTE) SARS-CoV-2 target nucleic acids are NOT DETECTED.  The SARS-CoV-2 RNA is generally detectable in upper respiratoy specimens during the acute phase of infection. The lowest concentration of SARS-CoV-2 viral copies this assay can detect is 131 copies/mL. A negative result does not preclude SARS-Cov-2 infection and should not be used as the sole basis for treatment or other patient management decisions. A negative result may occur with  improper specimen collection/handling, submission of specimen other than nasopharyngeal swab, presence of viral mutation(s) within the areas targeted by this assay, and inadequate number of viral copies (<131 copies/mL). A negative result must be combined with  clinical observations, patient history, and epidemiological information. The  expected result is Negative.  Fact Sheet for Patients:  PinkCheek.be  Fact Sheet for Healthcare Providers:  GravelBags.it  This test is no t yet approved or cleared by the [place] FDA and  has been authorized for detection and/or diagnosis of SARS-CoV-2 by FDA under an Emergency Use Authorization (EUA). This EUA will remain  in effect (meaning this test can be used) for the duration of the COVID-19 declaration under Section 564(b)(1) of the Act, 21 U.S.C. section 360bbb-3(b)(1), unless the authorization is terminated or revoked sooner.     Influenza A by PCR NEGATIVE NEGATIVE Final   Influenza B by PCR NEGATIVE NEGATIVE Final    Comment: (NOTE) The Xpert Xpress SARS-CoV-2/FLU/RSV assay is intended as an aid in  the diagnosis of influenza from Nasopharyngeal swab specimens and  should not be used as a sole basis for treatment. Nasal washings and  aspirates are unacceptable for Xpert Xpress SARS-CoV-2/FLU/RSV  testing.  Fact Sheet for Patients: PinkCheek.be  Fact Sheet for Healthcare Providers: GravelBags.it  This test is not yet approved or cleared by the [place] FDA and  has been authorized for detection and/or diagnosis of SARS-CoV-2 by  FDA under an Emergency Use Authorization (EUA). This EUA will remain  in effect (meaning this test can be used) for the duration of the  Covid-19 declaration under Section 564(b)(1) of the Act, 21  U.S.C. section 360bbb-3(b)(1), unless the authorization is  terminated or revoked. Performed at [facility], [place] [address] [place], [place] [address]          Radiology Studies: DG Chest 2 View  Result Date: 12/14/2019 CLINICAL DATA:  Shortness of breath. Reported history of endocarditis EXAM: CHEST - 2 VIEW  COMPARISON:  August 18, 2019 FINDINGS: There is multifocal airspace opacity throughout the lungs bilaterally. There is suspected cavitation in several lesions. Heart is upper normal in size with pulmonary vascularity normal. No adenopathy. No bone lesions. IMPRESSION: Multifocal airspace opacity bilaterally with several areas of suspected cavitation. Concern for septic emboli given areas of suspected cavitation and history of endocarditis. A degree of superimposed pneumonia and/or aspiration is also possible. Heart upper normal in size.  No adenopathy. Electronically Signed   By: Lowella Grip III M.D.   On: 12/14/2019 09:31   ECHOCARDIOGRAM COMPLETE  Result Date: 12/14/2019    ECHOCARDIOGRAM REPORT   Patient Name:   Alex [name] Date of Exam: 12/14/2019 Medical Rec #:  [id number]      Height:       68.0 in Accession #:    [id number]     Weight:       119.0 lb Date of Birth:  Mar 02, 1986      BSA:          1.639 m Patient Age:    50 years       BP:           98/64 mmHg Patient Gender: M              HR:           92 bpm. Exam Location:  Inpatient Procedure: 2D Echo Indications:    endocarditis  History:        Patient has prior history of Echocardiogram examinations, most                 recent 12/31/2018. CHF, Endocarditis; Risk Factors:IV drug use.  Sonographer:    Johny Chess Referring Phys: 62 [place]  1.  Left ventricular ejection fraction, by estimation, is 60 to 65%. The left ventricle has normal function. The left ventricle has no regional wall motion abnormalities. Left ventricular diastolic parameters were normal.  2. Right ventricular systolic function is mildly reduced. The right ventricular size is severely enlarged. There is mildly elevated pulmonary artery systolic pressure.  3. Right atrial size was severely dilated.  4. A small pericardial effusion is present. The pericardial effusion is posterior to the left ventricle.  5. The mitral valve is normal in structure. Trivial  mitral valve regurgitation. No evidence of mitral stenosis.  6. Large mobile vegetation on the septal and lateral leaflet with leaflet destruction and failure to coapt leading to wide open TR. The tricuspid valve is abnormal. Tricuspid valve regurgitation is severe.  7. The aortic valve is normal in structure. Aortic valve regurgitation is not visualized. No aortic stenosis is present.  8. The inferior vena cava is dilated in size with <50% respiratory variability, suggesting right atrial pressure of 15 mmHg. FINDINGS  Left Ventricle: Left ventricular ejection fraction, by estimation, is 60 to 65%. The left ventricle has normal function. The left ventricle has no regional wall motion abnormalities. The left ventricular internal cavity size was normal in size. There is  no left ventricular hypertrophy. Left ventricular diastolic parameters were normal. Right Ventricle: The right ventricular size is severely enlarged. No increase in right ventricular wall thickness. Right ventricular systolic function is mildly reduced. There is mildly elevated pulmonary artery systolic pressure. The tricuspid regurgitant velocity is 2.70 m/s, and with an assumed right atrial pressure of 15 mmHg, the estimated right ventricular systolic pressure is 16.3 mmHg. Left Atrium: Left atrial size was normal in size. Right Atrium: Right atrial size was severely dilated. Pericardium: A small pericardial effusion is present. The pericardial effusion is posterior to the left ventricle. Mitral Valve: The mitral valve is normal in structure. Trivial mitral valve regurgitation. No evidence of mitral valve stenosis. Tricuspid Valve: Large mobile vegetation on the septal and lateral leaflet with leaflet destruction and failure to coapt leading to wide open TR. The tricuspid valve is abnormal. Tricuspid valve regurgitation is severe. No evidence of tricuspid stenosis. Aortic Valve: The aortic valve is normal in structure. Aortic valve regurgitation is  not visualized. No aortic stenosis is present. Pulmonic Valve: The pulmonic valve was normal in structure. Pulmonic valve regurgitation is trivial. No evidence of pulmonic stenosis. Aorta: The aortic root is normal in size and structure. Venous: The inferior vena cava is dilated in size with less than 50% respiratory variability, suggesting right atrial pressure of 15 mmHg. IAS/Shunts: No atrial level shunt detected by color flow Doppler.  LEFT VENTRICLE PLAX 2D LVIDd:         4.45 cm  Diastology LVIDs:         2.77 cm  LV e' medial:    12.50 cm/s LV PW:         0.98 cm  LV E/e' medial:  4.8 LV IVS:        0.91 cm  LV e' lateral:   16.80 cm/s LVOT diam:     1.70 cm  LV E/e' lateral: 3.6 LV SV:         29 LV SV Index:   17 LVOT Area:     2.27 cm  RIGHT VENTRICLE             IVC RV S prime:     15.80 cm/s  IVC diam: 2.71 cm TAPSE (M-mode): 2.0  cm LEFT ATRIUM             Index LA diam:        3.50 cm 2.14 cm/m LA Vol (A2C):   53.4 ml 32.58 ml/m LA Vol (A4C):   39.3 ml 23.98 ml/m LA Biplane Vol: 48.0 ml 29.29 ml/m  AORTIC VALVE LVOT Vmax:   80.80 cm/s LVOT Vmean:  49.100 cm/s LVOT VTI:    0.126 m  AORTA Ao Root diam: 2.80 cm Ao Asc diam:  2.70 cm MITRAL VALVE               TRICUSPID VALVE MV Area (PHT): 4.68 cm    TR Peak grad:   29.2 mmHg MV Decel Time: 162 msec    TR Vmax:        270.00 cm/s MV E velocity: 60.20 cm/s MV A velocity: 49.80 cm/s  SHUNTS MV E/A ratio:  1.21        Systemic VTI:  0.13 m                            Systemic Diam: 1.70 cm Jenkins Rouge MD Electronically signed by Jenkins Rouge MD Signature Date/Time: 12/14/2019/3:58:33 PM    Final         Scheduled Meds: . melatonin  3 mg Oral QHS  . sodium chloride flush  3 mL Intravenous Q12H   Continuous Infusions: . sodium chloride    . vancomycin 150 mL/hr at 12/15/19 0600     LOS: 1 day    Time spent:40 min    Sabryna Lahm, Geraldo Docker, MD [facility] Pager [phone]  If 7PM-7AM, please contact  night-coverage [email or web address] Password [email or web address] 12/15/2019, 8:06 AM

## 2019-12-15 NOTE — Progress Notes (Addendum)
Progress Note  Patient Name: Alex [name] Date of Encounter: 12/15/2019  Primary Cardiologist: Thurmon Fair, MD  Subjective   Feeling somewhat better today, still with intermittent diffuse pain.  Inpatient Medications    Scheduled Meds:  melatonin  3 mg Oral QHS   sodium chloride flush  3 mL Intravenous Q12H   Continuous Infusions:  sodium chloride     vancomycin 150 mL/hr at 12/15/19 0600   PRN Meds: sodium chloride, acetaminophen, albuterol, HYDROmorphone (DILAUDID) injection, ondansetron (ZOFRAN) IV, sodium chloride flush   Vital Signs    Vitals:   12/15/19 0130 12/15/19 0400 12/15/19 0500 12/15/19 0536  BP: 99/74   102/67  Pulse: 85   100  Resp: 18   18  Temp: 98 F (36.7 C)   (!) 100.6 F (38.1 C)  TempSrc: Oral   Oral  SpO2: 95% 96%    Weight:   56.7 kg   Height:        Intake/Output Summary (Last 24 hours) at 12/15/2019 1109 Last data filed at 12/15/2019 1032 Gross per 24 hour  Intake 722.22 ml  Output 1650 ml  Net -927.78 ml   Last 3 Weights 12/15/2019 12/14/2019 08/20/2019  Weight (lbs) 125 lb 119 lb 119 lb 14.4 oz  Weight (kg) 56.7 kg 53.978 kg 54.386 kg     Telemetry    NSR - Personally Reviewed  Physical Exam   GEN: No acute distress, pale, chronically ill appearing HEENT: Normocephalic, atraumatic, sclera non-icteric. Neck: Bounding neck veins Cardiac: RRR, holosystolic murmur at LSB, no rubs or gallops.  Radials/DP/PT 1+ and equal bilaterally.  Respiratory: Clear to auscultation bilaterally. Breathing is unlabored. GI: Soft, nontender, non-distended, BS +x 4. MS: no deformity. Extremities: No clubbing or cyanosis. LE Edema improved compared to yesterday. Distal pedal pulses are 2+ and equal bilaterally. Neuro:  AAOx3. Follows commands. Psych:  More responsive today, normal affect  Labs    High Sensitivity Troponin:  No results for input(s): TROPONINIHS in the last 720 hours.    Cardiac EnzymesNo results for input(s):  TROPONINI in the last 168 hours. No results for input(s): TROPIPOC in the last 168 hours.   Chemistry Recent Labs  Lab 12/14/19 0810 12/15/19 0518  NA 131* 131*  K 3.7 4.1  CL 100 100  CO2 23 21*  GLUCOSE 91 86  BUN 17 16  CREATININE 0.97 0.89  CALCIUM 7.5* 7.3*  PROT 6.6  --   ALBUMIN 1.5*  --   AST 100*  --   ALT 129*  --   ALKPHOS 224*  --   BILITOT 1.4*  --   GFRNONAA >60 >60  GFRAA >60 >60  ANIONGAP 8 10     Hematology Recent Labs  Lab 12/14/19 0810 12/14/19 0810 12/14/19 1252 12/14/19 1735 12/15/19 0518  WBC 22.9*  --   --   --  28.7*  RBC 2.51*  --   --   --  2.84*  HGB 7.3*   < > 7.6* 7.1* 8.0*  HCT 21.7*   < > 23.7* 22.1* 27.9*  MCV 86.5  --   --   --  98.2  MCH 29.1  --   --   --  28.2  MCHC 33.6  --   --   --  28.7*  RDW 16.2*  --   --   --  17.6*  PLT 315  --   --   --  432*   < > = values in this interval  not displayed.    BNP Recent Labs  Lab 12/14/19 0810  BNP 526.1*     DDimer No results for input(s): DDIMER in the last 168 hours.   Radiology    DG Chest 2 View  Result Date: 12/14/2019 CLINICAL DATA:  Shortness of breath. Reported history of endocarditis EXAM: CHEST - 2 VIEW COMPARISON:  August 18, 2019 FINDINGS: There is multifocal airspace opacity throughout the lungs bilaterally. There is suspected cavitation in several lesions. Heart is upper normal in size with pulmonary vascularity normal. No adenopathy. No bone lesions. IMPRESSION: Multifocal airspace opacity bilaterally with several areas of suspected cavitation. Concern for septic emboli given areas of suspected cavitation and history of endocarditis. A degree of superimposed pneumonia and/or aspiration is also possible. Heart upper normal in size.  No adenopathy. Electronically Signed   By: Bretta BangWilliam  Woodruff III M.D.   On: 12/14/2019 09:31   ECHOCARDIOGRAM COMPLETE  Result Date: 12/14/2019    ECHOCARDIOGRAM REPORT   Patient Name:   [name] M Colmenares Date of Exam: 12/14/2019 Medical Rec  #:  [id number]      Height:       68.0 in Accession #:    [id number]     Weight:       119.0 lb Date of Birth:  Dec 27, 1985      BSA:          1.639 m Patient Age:    34 years       BP:           98/64 mmHg Patient Gender: M              HR:           92 bpm. Exam Location:  Inpatient Procedure: 2D Echo Indications:    endocarditis  History:        Patient has prior history of Echocardiogram examinations, most                 recent 12/31/2018. CHF, Endocarditis; Risk Factors:IV drug use.  Sonographer:    Delcie RochLauren Pennington Referring Phys: [id number] DAYNA N DUNN IMPRESSIONS  1. Left ventricular ejection fraction, by estimation, is 60 to 65%. The left ventricle has normal function. The left ventricle has no regional wall motion abnormalities. Left ventricular diastolic parameters were normal.  2. Right ventricular systolic function is mildly reduced. The right ventricular size is severely enlarged. There is mildly elevated pulmonary artery systolic pressure.  3. Right atrial size was severely dilated.  4. A small pericardial effusion is present. The pericardial effusion is posterior to the left ventricle.  5. The mitral valve is normal in structure. Trivial mitral valve regurgitation. No evidence of mitral stenosis.  6. Large mobile vegetation on the septal and lateral leaflet with leaflet destruction and failure to coapt leading to wide open TR. The tricuspid valve is abnormal. Tricuspid valve regurgitation is severe.  7. The aortic valve is normal in structure. Aortic valve regurgitation is not visualized. No aortic stenosis is present.  8. The inferior vena cava is dilated in size with <50% respiratory variability, suggesting right atrial pressure of 15 mmHg. FINDINGS  Left Ventricle: Left ventricular ejection fraction, by estimation, is 60 to 65%. The left ventricle has normal function. The left ventricle has no regional wall motion abnormalities. The left ventricular internal cavity size was normal in size. There is  no  left ventricular hypertrophy. Left ventricular diastolic parameters were normal. Right Ventricle: The right ventricular size is severely enlarged. No increase  in right ventricular wall thickness. Right ventricular systolic function is mildly reduced. There is mildly elevated pulmonary artery systolic pressure. The tricuspid regurgitant velocity is 2.70 m/s, and with an assumed right atrial pressure of 15 mmHg, the estimated right ventricular systolic pressure is 44.2 mmHg. Left Atrium: Left atrial size was normal in size. Right Atrium: Right atrial size was severely dilated. Pericardium: A small pericardial effusion is present. The pericardial effusion is posterior to the left ventricle. Mitral Valve: The mitral valve is normal in structure. Trivial mitral valve regurgitation. No evidence of mitral valve stenosis. Tricuspid Valve: Large mobile vegetation on the septal and lateral leaflet with leaflet destruction and failure to coapt leading to wide open TR. The tricuspid valve is abnormal. Tricuspid valve regurgitation is severe. No evidence of tricuspid stenosis. Aortic Valve: The aortic valve is normal in structure. Aortic valve regurgitation is not visualized. No aortic stenosis is present. Pulmonic Valve: The pulmonic valve was normal in structure. Pulmonic valve regurgitation is trivial. No evidence of pulmonic stenosis. Aorta: The aortic root is normal in size and structure. Venous: The inferior vena cava is dilated in size with less than 50% respiratory variability, suggesting right atrial pressure of 15 mmHg. IAS/Shunts: No atrial level shunt detected by color flow Doppler.  LEFT VENTRICLE PLAX 2D LVIDd:         4.45 cm  Diastology LVIDs:         2.77 cm  LV e' medial:    12.50 cm/s LV PW:         0.98 cm  LV E/e' medial:  4.8 LV IVS:        0.91 cm  LV e' lateral:   16.80 cm/s LVOT diam:     1.70 cm  LV E/e' lateral: 3.6 LV SV:         29 LV SV Index:   17 LVOT Area:     2.27 cm  RIGHT VENTRICLE              IVC RV S prime:     15.80 cm/s  IVC diam: 2.71 cm TAPSE (M-mode): 2.0 cm LEFT ATRIUM             Index LA diam:        3.50 cm 2.14 cm/m LA Vol (A2C):   53.4 ml 32.58 ml/m LA Vol (A4C):   39.3 ml 23.98 ml/m LA Biplane Vol: 48.0 ml 29.29 ml/m  AORTIC VALVE LVOT Vmax:   80.80 cm/s LVOT Vmean:  49.100 cm/s LVOT VTI:    0.126 m  AORTA Ao Root diam: 2.80 cm Ao Asc diam:  2.70 cm MITRAL VALVE               TRICUSPID VALVE MV Area (PHT): 4.68 cm    TR Peak grad:   29.2 mmHg MV Decel Time: 162 msec    TR Vmax:        270.00 cm/s MV E velocity: 60.20 cm/s MV A velocity: 49.80 cm/s  SHUNTS MV E/A ratio:  1.21        Systemic VTI:  0.13 m                            Systemic Diam: 1.70 cm Charlton Haws MD Electronically signed by Charlton Haws MD Signature Date/Time: 12/14/2019/3:58:33 PM    Final     Cardiac Studies   2D Echo 12/14/19  1. Left ventricular ejection fraction, by estimation,  is 60 to 65%. The  left ventricle has normal function. The left ventricle has no regional  wall motion abnormalities. Left ventricular diastolic parameters were  normal.   2. Right ventricular systolic function is mildly reduced. The right  ventricular size is severely enlarged. There is mildly elevated pulmonary  artery systolic pressure.   3. Right atrial size was severely dilated.   4. A small pericardial effusion is present. The pericardial effusion is  posterior to the left ventricle.   5. The mitral valve is normal in structure. Trivial mitral valve  regurgitation. No evidence of mitral stenosis.   6. Large mobile vegetation on the septal and lateral leaflet with leaflet  destruction and failure to coapt leading to wide open TR. The tricuspid  valve is abnormal. Tricuspid valve regurgitation is severe.   7. The aortic valve is normal in structure. Aortic valve regurgitation is  not visualized. No aortic stenosis is present.   8. The inferior vena cava is dilated in size with <50% respiratory  variability,  suggesting right atrial pressure of 15 mmHg.   Patient Profile     34 y.o. 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 Per chart he was in [facility] recently for MRSA bacteremia, found to have septic pulmonary emboli and endocarditis. He indicates he was there for 5 days and left AMA because they wanted to transfer him to [facility] and he did not want to go. He returned to [facility] 12/14/2019 with diffuse pain, found to have leukocytosis, anemia with Hgb 7.3, hyponatremia, abnormal LFTs, severe hypoalbuminemia of 1.5.  CXR shows multifocal airspace opacity bilaterally with several areas of suspected cavitation with concern for septic emboli given areas of cavitation and h/o endocarditis; a degree of superimposed pneumonia and/or aspiration is also possible. He has been started on IV vancomycin here. Cardiology asked to see 2/2 endocarditis.   Assessment & Plan    1. 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 (with prior history of TV endocarditis 2020) - we had unsuccessful attempt at getting records; paper chart shows the floor is now trying to obtain on behalf of ID - 2D echo shows normal LVEF with large mobile tricuspid vegetation with leaflet destruction and failure to coapt leading to wide open severe tricuspid regurgitation, also with small pericardial effusion, severely dilated RA, severely enlarged RV with mildly reduced RV function and mildly elevated PASP - currently on Vancomycin - ID following - per discussion preliminarily with MD, do not anticipate invasive management but see below for further discussion   2. IVDA - needs social work/rehab arranged    3. Depression and homelessness - to be managed in context of #2, per primary team   4. Anemia - acutely worsened from 10/2019, Hgb 11.3->7.3->7.1->8.0, IM following closely  For questions  or updates, please contact [facility] Please consult [email or web address] for contact info under Cardiology/STEMI.  Signed, Laurann Montana, PA-C 12/15/2019, 11:09 AM    I have seen and examined the patient along with Laurann Montana, PA-C.  I have reviewed the chart, notes and new data.  I agree with PA/NP's note.  Key new complaints: no new CV complaints Key examination changes: short systolic murmur at LLSB Key new findings / data: echo reviewed. Images are of very good quality. No vegetations are seen on the mitral or aortic valve. There are moderate sized mobile new vegetations on the tricuspid  leaflets, which are severely damaged. There is wide-open /torrential TR. No annular abscess is seen.  PLAN: TEE would not add any information that would change care. He is tolerating the TR well (this is now a chronic disorder). He is not a candidate for surgical repair/replacement and will not be a candidate until his substance abuse problem is clearly under control. Severe TR can be tolerated for extended periods of time (many years). Placing any cardiac prosthetic material in the setting of IV drug abuse would make an already bad situation, potentially disastruous  Thurmon Fair, MD, [facility] [phone] 12/15/2019, 12:04 PM

## 2019-12-15 NOTE — Progress Notes (Signed)
ANTICOAGULATION CONSULT NOTE - Initial Consult  Pharmacy Consult for Lovenox Indication: VTE prophylaxis  No Known Allergies  Patient Measurements: Height: 5\' 8"  (172.7 cm) Weight: 56.7 kg (125 lb) IBW/kg (Calculated) : 68.4 Heparin Dosing Weight:   Vital Signs: Temp: 99.8 F (37.7 C) (10/05 1142) Temp Source: Oral (10/05 1142) BP: 92/65 (10/05 1142) Pulse Rate: 89 (10/05 1142)  Labs: Recent Labs    12/14/19 0810 12/14/19 0810 12/14/19 1252 12/14/19 1252 12/14/19 1735 12/15/19 0518  HGB 7.3*   < > 7.6*   < > 7.1* 8.0*  HCT 21.7*   < > 23.7*  --  22.1* 27.9*  PLT 315  --   --   --   --  432*  CREATININE 0.97  --   --   --   --  0.89   < > = values in this interval not displayed.    Estimated Creatinine Clearance: 93.8 mL/min (by C-G formula based on SCr of 0.89 mg/dL).   Medical History: Past Medical History:  Diagnosis Date  . ADHD (attention deficit hyperactivity disorder)   . Allergy   . Asthma   . Depressed   . 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 ETOH abuse   . History of suicidal tendencies   . Polysubstance abuse (HCC)     Assessment: 34 yoM admitted with MRSA endocarditis.  He is not a surgical candidate per cardiology notes.  Pharmacy is consulted to dose Lovenox for VTE prophylaxis.  SCDs ordered. SCr < 1, CrCl > 30 CBC: Hgb low/ stable at 8, Plt elevated No bleeding or complications noted.   Plan:  Lovenox 40 mg Helper q24h Pharmacy to sign off consult.  20 PharmD, BCPS Clinical Pharmacist [facility] main pharmacy [phone] 12/15/2019 7:48 PM

## 2019-12-15 NOTE — Progress Notes (Signed)
PHARMACY - PHYSICIAN COMMUNICATION CRITICAL VALUE ALERT - BLOOD CULTURE IDENTIFICATION (BCID)  Alex [name] is an 34 y.o. male who presented to [facility] on 12/14/2019 with a chief complaint of IVDU, hx of MRSA endocarditis   Name of physician (or Provider) Contacted: M. Katherina Right  Current antibiotics: vancomycin  Changes to prescribed antibiotics recommended:  none  No results found for this or any previous visit.  Arley Phenix RPh 12/15/2019, 12:50 AM

## 2019-12-15 NOTE — Telephone Encounter (Signed)
Alex [name] with [facility] states she is returning a call to [employer]. Marcelino Duster would like to make her aware that she does not have any medical records, as the patient has never been seen in [facility].

## 2019-12-16 ENCOUNTER — Inpatient Hospital Stay (HOSPITAL_COMMUNITY): Payer: Self-pay

## 2019-12-16 DIAGNOSIS — Z515 Encounter for palliative care: Secondary | ICD-10-CM

## 2019-12-16 DIAGNOSIS — I2690 Septic pulmonary embolism without acute cor pulmonale: Secondary | ICD-10-CM

## 2019-12-16 DIAGNOSIS — Z7189 Other specified counseling: Secondary | ICD-10-CM

## 2019-12-16 LAB — DIRECT ANTIGLOBULIN TEST (NOT AT ARMC)
DAT, IgG: NEGATIVE
DAT, complement: NEGATIVE

## 2019-12-16 LAB — CBC WITH DIFFERENTIAL/PLATELET
Abs Immature Granulocytes: 0.25 10*3/uL — ABNORMAL HIGH (ref 0.00–0.07)
Abs Immature Granulocytes: 0.24 10*3/uL — ABNORMAL HIGH (ref 0.00–0.07)
Basophils Absolute: 0.1 10*3/uL (ref 0.0–0.1)
Basophils Absolute: 0.1 10*3/uL (ref 0.0–0.1)
Basophils Relative: 0 %
Basophils Relative: 0 %
Eosinophils Absolute: 0.1 10*3/uL (ref 0.0–0.5)
Eosinophils Absolute: 0.1 10*3/uL (ref 0.0–0.5)
Eosinophils Relative: 0 %
Eosinophils Relative: 0 %
HCT: 22.5 % — ABNORMAL LOW (ref 39.0–52.0)
HCT: 24.2 % — ABNORMAL LOW (ref 39.0–52.0)
Hemoglobin: 7.1 g/dL — ABNORMAL LOW (ref 13.0–17.0)
Hemoglobin: 7.4 g/dL — ABNORMAL LOW (ref 13.0–17.0)
Immature Granulocytes: 1 %
Immature Granulocytes: 1 %
Lymphocytes Relative: 19 %
Lymphocytes Relative: 16 %
Lymphs Abs: 3.6 10*3/uL (ref 0.7–4.0)
Lymphs Abs: 3.1 10*3/uL (ref 0.7–4.0)
MCH: 28 pg (ref 26.0–34.0)
MCH: 27.5 pg (ref 26.0–34.0)
MCHC: 31.6 g/dL (ref 30.0–36.0)
MCHC: 30.6 g/dL (ref 30.0–36.0)
MCV: 88.6 fL (ref 80.0–100.0)
MCV: 90 fL (ref 80.0–100.0)
Monocytes Absolute: 1.3 10*3/uL — ABNORMAL HIGH (ref 0.1–1.0)
Monocytes Absolute: 1.3 10*3/uL — ABNORMAL HIGH (ref 0.1–1.0)
Monocytes Relative: 7 %
Monocytes Relative: 7 %
Neutro Abs: 13.1 10*3/uL — ABNORMAL HIGH (ref 1.7–7.7)
Neutro Abs: 14.8 10*3/uL — ABNORMAL HIGH (ref 1.7–7.7)
Neutrophils Relative %: 73 %
Neutrophils Relative %: 76 %
Platelets: 374 10*3/uL (ref 150–400)
Platelets: 404 10*3/uL — ABNORMAL HIGH (ref 150–400)
RBC: 2.54 MIL/uL — ABNORMAL LOW (ref 4.22–5.81)
RBC: 2.69 MIL/uL — ABNORMAL LOW (ref 4.22–5.81)
RDW: 17.7 % — ABNORMAL HIGH (ref 11.5–15.5)
RDW: 18.1 % — ABNORMAL HIGH (ref 11.5–15.5)
WBC: 18.3 10*3/uL — ABNORMAL HIGH (ref 4.0–10.5)
WBC: 19.5 10*3/uL — ABNORMAL HIGH (ref 4.0–10.5)
nRBC: 0 % (ref 0.0–0.2)
nRBC: 0 % (ref 0.0–0.2)

## 2019-12-16 LAB — COMPREHENSIVE METABOLIC PANEL
ALT: 89 U/L — ABNORMAL HIGH (ref 0–44)
AST: 90 U/L — ABNORMAL HIGH (ref 15–41)
Albumin: 1.4 g/dL — ABNORMAL LOW (ref 3.5–5.0)
Alkaline Phosphatase: 176 U/L — ABNORMAL HIGH (ref 38–126)
Anion gap: 7 (ref 5–15)
BUN: 15 mg/dL (ref 6–20)
CO2: 23 mmol/L (ref 22–32)
Calcium: 7.1 mg/dL — ABNORMAL LOW (ref 8.9–10.3)
Chloride: 102 mmol/L (ref 98–111)
Creatinine, Ser: 0.84 mg/dL (ref 0.61–1.24)
GFR calc non Af Amer: >60 mL/min (ref >60)
Glucose, Bld: 106 mg/dL — ABNORMAL HIGH (ref 70–99)
Potassium: 3.9 mmol/L (ref 3.5–5.1)
Sodium: 132 mmol/L — ABNORMAL LOW (ref 135–145)
Total Bilirubin: 0.8 mg/dL (ref 0.3–1.2)
Total Protein: 6.2 g/dL — ABNORMAL LOW (ref 6.5–8.1)

## 2019-12-16 LAB — CBC
HCT: 23.6 % — ABNORMAL LOW (ref 39.0–52.0)
Hemoglobin: 7.3 g/dL — ABNORMAL LOW (ref 13.0–17.0)
MCH: 28 pg (ref 26.0–34.0)
MCHC: 30.9 g/dL (ref 30.0–36.0)
MCV: 90.4 fL (ref 80.0–100.0)
Platelets: 381 10*3/uL (ref 150–400)
RBC: 2.61 MIL/uL — ABNORMAL LOW (ref 4.22–5.81)
RDW: 17.9 % — ABNORMAL HIGH (ref 11.5–15.5)
WBC: 20 10*3/uL — ABNORMAL HIGH (ref 4.0–10.5)
nRBC: 0 % (ref 0.0–0.2)

## 2019-12-16 LAB — TROPONIN I (HIGH SENSITIVITY)
Troponin I (High Sensitivity): 7 ng/L (ref <18)
Troponin I (High Sensitivity): 7 ng/L (ref <18)

## 2019-12-16 LAB — D-DIMER, QUANTITATIVE: D-Dimer, Quant: 7.26 ug/mL-FEU — ABNORMAL HIGH (ref 0.00–0.50)

## 2019-12-16 LAB — MAGNESIUM: Magnesium: 1.8 mg/dL (ref 1.7–2.4)

## 2019-12-16 LAB — PROTIME-INR
INR: 1.3 — ABNORMAL HIGH (ref 0.8–1.2)
Prothrombin Time: 15.8 seconds — ABNORMAL HIGH (ref 11.4–15.2)

## 2019-12-16 LAB — TECHNOLOGIST SMEAR REVIEW

## 2019-12-16 LAB — ABO/RH: ABO/RH(D): A POS

## 2019-12-16 LAB — PHOSPHORUS: Phosphorus: 3 mg/dL (ref 2.5–4.6)

## 2019-12-16 LAB — FIBRINOGEN: Fibrinogen: 259 mg/dL (ref 210–475)

## 2019-12-16 MED ORDER — LORAZEPAM 0.5 MG PO TABS
0.5000 mg | ORAL_TABLET | Freq: Four times a day (QID) | ORAL | Status: DC | PRN
  Administered 2019-12-16 – 2019-12-27 (×9): 0.5 mg via ORAL
  Filled 2019-12-16 (×9): qty 1

## 2019-12-16 MED ORDER — GABAPENTIN 300 MG PO CAPS
300.0000 mg | ORAL_CAPSULE | Freq: Three times a day (TID) | ORAL | Status: DC
  Administered 2019-12-16 – 2019-12-24 (×25): 300 mg via ORAL
  Filled 2019-12-16 (×25): qty 1

## 2019-12-16 MED ORDER — KETOROLAC TROMETHAMINE 30 MG/ML IJ SOLN
30.0000 mg | Freq: Four times a day (QID) | INTRAMUSCULAR | Status: DC
  Administered 2019-12-16 – 2019-12-21 (×18): 30 mg via INTRAVENOUS
  Filled 2019-12-16 (×18): qty 1

## 2019-12-16 MED ORDER — HYDROCODONE-ACETAMINOPHEN 5-325 MG PO TABS
1.0000 | ORAL_TABLET | Freq: Four times a day (QID) | ORAL | Status: DC | PRN
  Administered 2019-12-16 – 2019-12-17 (×5): 1 via ORAL
  Filled 2019-12-16 (×5): qty 1

## 2019-12-16 MED ORDER — NITROGLYCERIN 0.4 MG SL SUBL
0.4000 mg | SUBLINGUAL_TABLET | SUBLINGUAL | Status: DC | PRN
  Administered 2019-12-16: 0.4 mg via SUBLINGUAL
  Filled 2019-12-16: qty 1

## 2019-12-16 MED ORDER — KETOROLAC TROMETHAMINE 15 MG/ML IJ SOLN
15.0000 mg | Freq: Four times a day (QID) | INTRAMUSCULAR | Status: DC
  Administered 2019-12-16: 15 mg via INTRAVENOUS
  Filled 2019-12-16: qty 1

## 2019-12-16 MED ORDER — HYDROMORPHONE HCL 1 MG/ML IJ SOLN
0.5000 mg | INTRAMUSCULAR | Status: DC | PRN
  Administered 2019-12-16 – 2019-12-22 (×34): 0.5 mg via INTRAVENOUS
  Filled 2019-12-16 (×34): qty 0.5

## 2019-12-16 NOTE — Consult Note (Signed)
Palliative Care Consult Note  Reason for consult: Goals of care in light of endocarditis and history of IV substance abuse  Palliative care consult received.  Chart reviewed including personal review of pertinent labs and imaging.    Briefly, Alex [name] is a 34 year old male with past medical history of tricuspid valve endocarditis, polysubstance abuse with IV substance abuse, alcohol use, ADHD who was admitted following presentation to the ED with worsening fatigue.  Chart review reveals that a week ago he had been in [facility] and feeling unwell and found to have MRSA bacteremia with endocarditis and septic pulmonary emboli.  He left the hospital AMA a couple of days prior to presenting to [facility] ED.  He has also been evaluated by cardiology and infectious disease this admission.  Palliative consulted for goals of care.  I saw and examined Alex [name] today.  He was awake and alert and lying in bed.  His primary complaint today was continued pain in his back and lower extremities.  I introduced palliative care as specialized medical care for people living with serious illness. It focuses on providing relief from the symptoms and stress of a serious illness. The goal is to improve quality of life for both the patient and the family.  We discussed clinical course as well as wishes moving forward in regard to advanced directives.  Concepts specific to code status, surrogate decision making, and rehospitalization discussed.  Values and goals of care important to patient and family were attempted to be elicited.  He reports that in the event he cannot make his own decisions, his father should be his surrogate decision maker.  We discussed that legally [place] both his mother and father would have equal weight at this time unless he completes paperwork to designate otherwise.  He is agreeable to completion of [facility] POA paperwork.  We also discussed limitations of care moving forward and concerns  regarding potential for decline based upon the seriousness of his condition and concern about his ability to comply with recommended medical interventions.  Questions and concerns addressed.   PMT will continue to support holistically.  Recommendations: - Full code/Full scope treatment.   - Discussed that he is very ill and medical team's concern that he is at risk for decline and catastrophic complications with endocarditis and continued substance use.  Discussed that risk of these increases with deferring recommended medical interventions.  Alex [name] reports that he understands and "this time, I am staying here until you guys tell me I am ready to go." - He reports that he would like to name his father is a Runner, broadcasting/film/video.  I placed consult to spiritual care for completion of healthcare power of attorney paperwork. - No further palliative specific needs.  Will not continue to follow Alex [name] daily.  Please call if there are additional palliative specific needs with which we can be of assistance in the care of Alex [name] moving forward.  Start time: 1015 End time: 1100  Total time: 45 minutes Greater than 50%  of this time was spent counseling and coordinating care related to the above assessment and plan.  Romie Minus, MD [facility] Palliative Medicine Team [phone]

## 2019-12-16 NOTE — Progress Notes (Signed)
Pt alert and aware father at bedside. Pt states he doing okay. I explained my reason for being there. And the pt asked me to leave the paperwork. Someone will follow-up to complete the AD.

## 2019-12-16 NOTE — Progress Notes (Signed)
[facility] Progress Note  Patient: Alex [name]    JIR:[id number]  DOA: 12/14/2019     Date of Service: the patient was seen and examined on 12/16/2019  Brief hospital course: Past medical history of alcohol abuse, polysubstance abuse with cocaine and meth with occasional use of fentanyl, endocarditis of tricuspid valve, depression, suicidal tendencies, smoker. Recently admitted at [facility], admitted on 12/08/2019, left AMA on 12/09/2019. UDS negative for opiates in June 2021. Patient tells me that he is using occasional fentanyl but not on a regular basis.  His regular recreational drugs are cocaine and methamphetamine. CT chest without contrast Septic emboli. Ultrasound abdomen hepatomegaly Bilateral lower extremity Doppler negative for DVT with bilateral inguinal lymphadenopathy Echocardiogram severe TR large vegetation on posterior leaflet and small vegetation on anterior leaflet. UDS positive for cocaine, opiates and amphetamine. Hep C antibody reactive HIV nonreactive  Currently plan is further work-up for back involvement with MRSA bacteremia as well as CT surgery evaluation.  Assessment and Plan: 1.  Back pain. Shortness of breath. 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 Currently on IV vancomycin And will likely require long-term IV antibiotics. Recently was seen at [facility] with different identity Alex [name]. MRI spine ordered under anesthesia. CT surgery was consulted although will discuss with cardiology and CT surgery regarding TEE as it does not appear that the patient had TEE before. Left upper extremity also swollen.  Will check ultrasound.  2.  Anemia.  Iron deficiency. Continue oral iron.  Continue vitamin C.  Monitor daily CBC.  3.  Polysubstance abuse. UDS positive for amphetamine and opiates. Outside UDS also positive for benzodiazepine.  4.  Pain control. Withdrawal  symptoms. Difficult to control the symptoms for now. Patient doing acute pain secondary to back pain likely from discitis or osteomyelitis involvement from MRSA endocarditis. Therefore currently holding off on the Suboxone therapy for opiates part. Also patient consistently not using fentanyl and he mentions that fentanyl is only part of the other medication that he uses consistently. Continue as needed Ativan. Continue scheduled gabapentin. Scheduled Toradol.  Body mass index is 21.09 kg/m.    Interventions:        Diet: Cardiac diet. DVT Prophylaxis:   enoxaparin (LOVENOX) injection 40 mg Start: 12/15/19 2200 Place and maintain sequential compression device Start: 12/14/19 1121    Advance goals of care discussion: Full code  Family Communication: family was present at bedside, at the time of interview.  The pt provided permission to discuss medical plan with the family. Opportunity was given to ask question and all questions were answered satisfactorily.   Disposition:  Status is: Inpatient  Remains inpatient appropriate because:Inpatient level of care appropriate due to severity of illness   Dispo: The patient is from: Home              Anticipated d/c is to: Home              Anticipated d/c date is: > 3 days              Patient currently is not medically stable to d/c.        Subjective: No nausea no vomiting.  No fever no chills.  No chest pain.  Physical Exam:  General: Appear in mild distress, no Rash; Oral Mucosa Clear, moist. no Abnormal Neck Mass Or lumps, Conjunctiva normal  Cardiovascular: S1 and S2 Present, aortic systolic  Murmur, Respiratory: increased respiratory effort, Bilateral Air entry present and  bilateral  Crackles, no wheezes Abdomen: Bowel Sound present, Soft and no tenderness Extremities: no Pedal edema Neurology: alert and oriented to time, place, and person affect appropriate. no new focal deficit Gait not checked due to patient  safety concerns  Vitals:   12/16/19 0429 12/16/19 0903 12/16/19 1004 12/16/19 1259  BP: 108/72 122/84 110/63 111/72  Pulse: 89 99 (!) 101 97  Resp: 18 14 18 18   Temp: 99.2 F (37.3 C) 98.6 F (37 C) (!) 100.9 F (38.3 C) 99.5 F (37.5 C)  TempSrc: Oral Oral Oral Oral  SpO2: 98% 97% 97% 96%  Weight: 62.9 kg     Height:        Intake/Output Summary (Last 24 hours) at 12/16/2019 2019 Last data filed at 12/16/2019 1731 Gross per 24 hour  Intake 726 ml  Output 950 ml  Net -224 ml   Filed Weights   12/14/19 0330 12/15/19 0500 12/16/19 0429  Weight: 54 kg 56.7 kg 62.9 kg    Data Reviewed: I have personally reviewed and interpreted daily labs, tele strips, imagings as discussed above. I reviewed all nursing notes, pharmacy notes, vitals, pertinent old records I have discussed plan of care as described above with RN and patient/family.  CBC: Recent Labs  Lab 12/14/19 0810 12/14/19 1252 12/14/19 1735 12/15/19 0518 12/16/19 0424 12/16/19 1033 12/16/19 1715  WBC 22.9*  --   --  28.7* 18.3* 19.5* 20.0*  NEUTROABS 18.7*  --   --   --  13.1* 14.8*  --   HGB 7.3*   < > 7.1* 8.0* 7.1* 7.4* 7.3*  HCT 21.7*   < > 22.1* 27.9* 22.5* 24.2* 23.6*  MCV 86.5  --   --  98.2 88.6 90.0 90.4  PLT 315  --   --  432* 374 404* 381   < > = values in this interval not displayed.   Basic Metabolic Panel: Recent Labs  Lab 12/14/19 0810 12/15/19 0518 12/16/19 0424  NA 131* 131* 132*  K 3.7 4.1 3.9  CL 100 100 102  CO2 23 21* 23  GLUCOSE 91 86 106*  BUN 17 16 15   CREATININE 0.97 0.89 0.84  CALCIUM 7.5* 7.3* 7.1*  MG  --   --  1.8  PHOS  --   --  3.0    Studies: DG CHEST PORT 1 VIEW  Result Date: 12/16/2019 CLINICAL DATA:  Shortness of breath. EXAM: PORTABLE CHEST 1 VIEW COMPARISON:  December 14, 2019. FINDINGS: Stable cardiomediastinal silhouette. Stable bilateral patchy airspace opacities are noted concerning for multifocal pneumonia. No pneumothorax is noted. Minimal left pleural  effusion is noted. Bony thorax is unremarkable. IMPRESSION: Stable bilateral multifocal pneumonia. Electronically Signed   By: 02/15/2020 M.D.   On: 12/16/2019 10:45    Scheduled Meds:  vitamin C  500 mg Oral Daily   enoxaparin (LOVENOX) injection  40 mg Subcutaneous Q24H   ferrous sulfate  325 mg Oral BID WC   gabapentin  300 mg Oral TID   ketorolac  30 mg Intravenous Q6H   melatonin  3 mg Oral QHS   sodium chloride flush  3 mL Intravenous Q12H   Continuous Infusions:  sodium chloride 250 mL (12/16/19 0600)   vancomycin 750 mg (12/16/19 1741)   PRN Meds: sodium chloride, acetaminophen, albuterol, HYDROcodone-acetaminophen, HYDROmorphone (DILAUDID) injection, LORazepam, nitroGLYCERIN, ondansetron (ZOFRAN) IV, sodium chloride flush  Time spent: 35 minutes  Author: 02/15/20, MD [facility] 12/16/2019 8:19 PM  To reach On-call, see  care teams to locate the attending and reach out via [email or web address]. Between 7PM-7AM, please contact night-coverage If you still have difficulty reaching the attending provider, please page the [facility] (Director on Call) for [facility] on amion for assistance.

## 2019-12-16 NOTE — Progress Notes (Signed)
[facility]    Date of Admission:  12/14/2019   Total days of antibiotics 3           ID: Alex [name] is a 34 y.o. male with  MRSA bacteremia/TV endocarditis/pulmonary cavitary lesions Principal Problem:   Endocarditis Active Problems:   Polysubstance dependence (HCC)   IVDU (intravenous drug user)   Acute CHF (congestive heart failure) (HCC)   Anemia   Acute septic pulmonary embolism (HCC)   Goals of care, counseling/discussion   Palliative care encounter    Subjective: Still having fevers, feeling poorly with back pain. Denies chest pain   I reviewed his records from [facility]-had alias. mrsa bacteremia noted on 9/28. TV endocarditis, hep c ab +, UDS positive for opiates, cocaine, and amphetamines. Chest CT showed pulmonary cavitary lesions. abd CT showing some course echogenecity to the liver, no stones  Medications:  . vitamin C  500 mg Oral Daily  . enoxaparin (LOVENOX) injection  40 mg Subcutaneous Q24H  . ferrous sulfate  325 mg Oral BID WC  . gabapentin  300 mg Oral TID  . ketorolac  30 mg Intravenous Q6H  . melatonin  3 mg Oral QHS  . sodium chloride flush  3 mL Intravenous Q12H    Objective: Vital signs in last 24 hours: Temp:  [98.6 F (37 C)-101.1 F (38.4 C)] 99.5 F (37.5 C) (10/06 1259) Pulse Rate:  [89-101] 97 (10/06 1259) Resp:  [14-20] 18 (10/06 1259) BP: (103-122)/(63-84) 111/72 (10/06 1259) SpO2:  [96 %-98 %] 96 % (10/06 1259) Weight:  [62.9 kg] 62.9 kg (10/06 0429) Physical Exam  Constitutional: He is oriented to person, place, and time. He appears well-developed and well-nourished. No distress.  HENT:  Mouth/Throat: Oropharynx is clear and moist. No oropharyngeal exudate.  Cardiovascular: Normal rate, regular rhythm and normal heart sounds. +murmur Neck: jvp elevated Pulmonary/Chest: Effort normal and breath sounds normal. No respiratory distress. He has no wheezes.  Abdominal: Soft. Bowel sounds are  normal. He exhibits no distension. There is no tenderness.  Lymphadenopathy:  He has no cervical adenopathy.  Neurological: He is alert and oriented to person, place, and time.  Skin: Skin is warm and dry. No rash noted. No erythema.  Psychiatric: He has a normal mood and affect. His behavior is normal.     Lab Results Recent Labs    12/15/19 0518 12/15/19 0518 12/16/19 0424 12/16/19 1033  WBC 28.7*   < > 18.3* 19.5*  HGB 8.0*   < > 7.1* 7.4*  HCT 27.9*   < > 22.5* 24.2*  NA 131*  --  132*  --   K 4.1  --  3.9  --   CL 100  --  102  --   CO2 21*  --  23  --   BUN 16  --  15  --   CREATININE 0.89  --  0.84  --    < > = values in this interval not displayed.   Liver Panel Recent Labs    12/14/19 0810 12/16/19 0424  PROT 6.6 6.2*  ALBUMIN 1.5* 1.4*  AST 100* 90*  ALT 129* 89*  ALKPHOS 224* 176*  BILITOT 1.4* 0.8  BILIDIR 0.6*  --   IBILI 0.8  --    Sedimentation Rate No results for input(s): ESRSEDRATE in the last 72 hours. C-Reactive Protein No results for input(s): CRP in the last 72 hours.  Microbiology: 10/4 blood cx MRSA 10/6 blood  cx pending Studies/Results: DG CHEST PORT 1 VIEW  Result Date: 12/16/2019 CLINICAL DATA:  Shortness of breath. EXAM: PORTABLE CHEST 1 VIEW COMPARISON:  December 14, 2019. FINDINGS: Stable cardiomediastinal silhouette. Stable bilateral patchy airspace opacities are noted concerning for multifocal pneumonia. No pneumothorax is noted. Minimal left pleural effusion is noted. Bony thorax is unremarkable. IMPRESSION: Stable bilateral multifocal pneumonia. Electronically Signed   By: Lupita Raider M.D.   On: 12/16/2019 10:45     Assessment/Plan: MRSA bacteremia/TV endocarditis/ pulmonary cavitary lesions = pulmonary burden is likely why he has ongoing fevers. Leukocytosis is improving. Continue with iv vancomycin. Still needs MRI of thoracic and lumbar spine - but will likely need some conscious sedation.  [facility] Cell: [phone] Pager: [phone]  12/16/2019, 5:28 PM

## 2019-12-17 ENCOUNTER — Inpatient Hospital Stay (HOSPITAL_COMMUNITY): Payer: Self-pay

## 2019-12-17 DIAGNOSIS — M7989 Other specified soft tissue disorders: Secondary | ICD-10-CM

## 2019-12-17 LAB — CBC
HCT: 23.4 % — ABNORMAL LOW (ref 39.0–52.0)
Hemoglobin: 7.3 g/dL — ABNORMAL LOW (ref 13.0–17.0)
MCH: 28.3 pg (ref 26.0–34.0)
MCHC: 31.2 g/dL (ref 30.0–36.0)
MCV: 90.7 fL (ref 80.0–100.0)
Platelets: 394 K/uL (ref 150–400)
RBC: 2.58 MIL/uL — ABNORMAL LOW (ref 4.22–5.81)
RDW: 18.4 % — ABNORMAL HIGH (ref 11.5–15.5)
WBC: 18.6 K/uL — ABNORMAL HIGH (ref 4.0–10.5)
nRBC: 0 % (ref 0.0–0.2)

## 2019-12-17 LAB — COMPREHENSIVE METABOLIC PANEL WITH GFR
ALT: 84 U/L — ABNORMAL HIGH (ref 0–44)
AST: 80 U/L — ABNORMAL HIGH (ref 15–41)
Albumin: 1.3 g/dL — ABNORMAL LOW (ref 3.5–5.0)
Alkaline Phosphatase: 215 U/L — ABNORMAL HIGH (ref 38–126)
Anion gap: 8 (ref 5–15)
BUN: 17 mg/dL (ref 6–20)
CO2: 22 mmol/L (ref 22–32)
Calcium: 7.4 mg/dL — ABNORMAL LOW (ref 8.9–10.3)
Chloride: 104 mmol/L (ref 98–111)
Creatinine, Ser: 0.83 mg/dL (ref 0.61–1.24)
GFR calc non Af Amer: >60 mL/min
Glucose, Bld: 101 mg/dL — ABNORMAL HIGH (ref 70–99)
Potassium: 3.9 mmol/L (ref 3.5–5.1)
Sodium: 134 mmol/L — ABNORMAL LOW (ref 135–145)
Total Bilirubin: 0.8 mg/dL (ref 0.3–1.2)
Total Protein: 6.4 g/dL — ABNORMAL LOW (ref 6.5–8.1)

## 2019-12-17 LAB — CULTURE, BLOOD (ROUTINE X 2): Special Requests: ADEQUATE

## 2019-12-17 LAB — VANCOMYCIN, TROUGH: Vancomycin Tr: 9 ug/mL — ABNORMAL LOW (ref 15–20)

## 2019-12-17 LAB — HAPTOGLOBIN: Haptoglobin: 285 mg/dL (ref 17–317)

## 2019-12-17 MED ORDER — VANCOMYCIN HCL 1500 MG/300ML IV SOLN
1500.0000 mg | Freq: Two times a day (BID) | INTRAVENOUS | Status: DC
  Administered 2019-12-17 – 2019-12-21 (×7): 1500 mg via INTRAVENOUS
  Filled 2019-12-17 (×9): qty 300

## 2019-12-17 NOTE — Progress Notes (Signed)
Pharmacy Antibiotic Note  Alex [name] is a 34 y.o. male admitted on 12/14/2019 with MRSA endocarditis w/ septic emboli (BCx from [facility] where patient recently left AMA, but was admitted to [facility] in Oct 2020 for same). Pharmacy has been consulted for vancomycin dosing.  Plan:  VT this AM is 9  Increase vancomycin to 1500 mg IV q12h   SCr q48 while on vanc   Monitor clinical course, renal function, cultures as available   Height: 5\' 8"  (172.7 cm) Weight: kg (138 lb 11.2 oz) IBW/kg (Calculated) : 68.4  Temp (24hrs), Avg:98.7 F (37.1 C), Min:97.9 F (36.6 C), Max:99.5 F (37.5 C)  Recent Labs  Lab 12/14/19 0810 12/14/19 0810 12/14/19 1252 12/15/19 0518 12/16/19 0424 12/16/19 1033 12/16/19 1715 12/17/19 0447  WBC 22.9*   < >  --  28.7* 18.3* 19.5* 20.0* 18.6*  CREATININE 0.97  --   --  0.89 0.84  --   --  0.83  LATICACIDVEN 1.8  --  2.2*  --   --   --   --   --   VANCOTROUGH  --   --   --   --   --   --   --  9*   < > = values in this interval not displayed.    Estimated Creatinine Clearance: 111.6 mL/min (by C-G formula based on SCr of 0.83 mg/dL).    No Known Allergies   Thank you for allowing pharmacy to be a part of this patient's care.   02/16/20, PharmD, BCPS 12/17/2019 12:26 PM

## 2019-12-17 NOTE — Progress Notes (Signed)
[facility]    Date of Admission:  12/14/2019   Total days of antibiotics 4           ID: Alex [name] is a 34 y.o. male with  mrsa TV endocarditis with ongoing bacteremia, pulmonary septic emboli Principal Problem:   Endocarditis Active Problems:   Polysubstance dependence (HCC)   IVDU (intravenous drug user)   Acute CHF (congestive heart failure) (HCC)   Anemia   Acute septic pulmonary embolism (HCC)   Goals of care, counseling/discussion   Palliative care encounter    Subjective: Still had fevers last night but fever curve trending downward today. Sleepy most of the day. He is still having some shortness of breath.  Dr lightfoot felt not a candidate for angiovac unless ongoing bacteremia/fevers  Medications:  . vitamin C  500 mg Oral Daily  . enoxaparin (LOVENOX) injection  40 mg Subcutaneous Q24H  . ferrous sulfate  325 mg Oral BID WC  . gabapentin  300 mg Oral TID  . ketorolac  30 mg Intravenous Q6H  . melatonin  3 mg Oral QHS  . sodium chloride flush  3 mL Intravenous Q12H    Objective: Vital signs in last 24 hours: Temp:  [97.9 F (36.6 C)-99.5 F (37.5 C)] 99.5 F (37.5 C) (10/07 1239) Pulse Rate:  [90-106] 106 (10/07 1239) Resp:  [17-20] 17 (10/07 1239) BP: (90-113)/(64-78) 106/73 (10/07 1239) SpO2:  [94 %-98 %] 94 % (10/07 1239)  Physical Exam  Constitutional: He is oriented to person, place, and time. He appears well-developed and well-nourished. No distress.  HENT:  Mouth/Throat: Oropharynx is clear and moist. No oropharyngeal exudate.  Cardiovascular: Normal rate, regular rhythm and normal heart sounds. +murmur Neck = elevated JVP to angle of jaw Pulmonary/Chest: Effort normal and breath sounds normal. No respiratory distress. He has no wheezes.  Abdominal: Soft. Bowel sounds are normal. He exhibits no distension. There is no tenderness.  Lymphadenopathy:  He has no cervical adenopathy.  Neurological: He is alert and  oriented to person, place, and time.  Skin: Skin is warm and dry. No rash noted. No erythema.  Psychiatric: He has a normal mood and affect. His behavior is normal.     Lab Results Recent Labs    12/16/19 0424 12/16/19 1033 12/16/19 1715 12/17/19 0447  WBC 18.3*   < > 20.0* 18.6*  HGB 7.1*   < > 7.3* 7.3*  HCT 22.5*   < > 23.6* 23.4*  NA 132*  --   --  134*  K 3.9  --   --  3.9  CL 102  --   --  104  CO2 23  --   --  22  BUN 15  --   --  17  CREATININE 0.84  --   --  0.83   < > = values in this interval not displayed.   Liver Panel Recent Labs    12/16/19 0424 12/17/19 0447  PROT 6.2* 6.4*  ALBUMIN 1.4* 1.3*  AST 90* 80*  ALT 89* 84*  ALKPHOS 176* 215*  BILITOT 0.8 0.8   Sedimentation Rate No results for input(s): ESRSEDRATE in the last 72 hours. C-Reactive Protein No results for input(s): CRP in the last 72 hours.  Microbiology: 10/6 blood cx + 10/4 blood cx + Studies/Results: DG CHEST PORT 1 VIEW  Result Date: 12/16/2019 CLINICAL DATA:  Shortness of breath. EXAM: PORTABLE CHEST 1 VIEW COMPARISON:  December 14, 2019. FINDINGS: Stable  cardiomediastinal silhouette. Stable bilateral patchy airspace opacities are noted concerning for multifocal pneumonia. No pneumothorax is noted. Minimal left pleural effusion is noted. Bony thorax is unremarkable. IMPRESSION: Stable bilateral multifocal pneumonia. Electronically Signed   By: Lupita Raider M.D.   On: 12/16/2019 10:45   VAS [place] UPPER EXTREMITY VENOUS DUPLEX  Result Date: 12/17/2019 UPPER VENOUS STUDY  Indications: Swelling, and IV drug abuse. Limitations: Line and patient not fully cooperative. Comparison Study: No prior study on file Performing Technologist: Sherren Kerns RVS  Examination Guidelines: A complete evaluation includes B-mode imaging, spectral Doppler, color Doppler, and power Doppler as needed of all accessible portions of each vessel. Bilateral testing is considered an integral part of a complete  examination. Limited examinations for reoccurring indications may be performed as noted.  Left Findings: +----------+------------+---------+-----------+----------+--------------+ LEFT      CompressiblePhasicitySpontaneousProperties   Summary     +----------+------------+---------+-----------+----------+--------------+ IJV           Full       Yes       Yes                             +----------+------------+---------+-----------+----------+--------------+ Subclavian               Yes       Yes                             +----------+------------+---------+-----------+----------+--------------+ Axillary      Full                                                 +----------+------------+---------+-----------+----------+--------------+ Brachial      Full       Yes       Yes                             +----------+------------+---------+-----------+----------+--------------+ Cephalic                                            Not visualized +----------+------------+---------+-----------+----------+--------------+ Basilic       Full                                                 +----------+------------+---------+-----------+----------+--------------+  Summary:  Left: No evidence of deep vein thrombosis in the upper extremity. No evidence of superficial vein thrombosis in the upper extremity. Cephalic vein not visualized. Tissue appears disturbed throughout the arm. etiology unknown.  *See table(s) above for measurements and observations.  Diagnosing physician: Sherald Hess MD Electronically signed by Sherald Hess MD on 12/17/2019 at 3:16:53 PM.    Final      Assessment/Plan: Disseminated MRSA infection with TV endocarditis, pulmonary septic emboli = he is to get conscious sedation for thoracic-lumbar mri of spine to evaluate for discitis. Continue on vancomycin. Repeat blood cx tomorrow or Saturday to see that he is clearing his bacteremia  Opiate  dependence = continue on current regimen and watch for withdrawal  Therapeutic drug  monitoring = will continue to check vanco trough  [facility] Cell: [phone] Pager: [phone]  12/17/2019, 5:37 PM

## 2019-12-17 NOTE — Progress Notes (Signed)
Patient highly adverse to Lovenox injections, states that they hurt and burn, no allergic reaction at site noted. Patient is requesting that VTE/treatment of emboli be switched to an oral medication if possible. Patient irritable that his pain medication was not delivered at PRN internals, due to patient's nurse working with another patient. Education provided on PRN scheduling, and intervals of administration or oral vs IV medications. Education provided regarding available medications at this time. Patient provided with available PRN medications per [facility].

## 2019-12-17 NOTE — Progress Notes (Signed)
[facility] Progress Note  Patient: Alex [name]    QVZ:[id number]  DOA: 12/14/2019     Date of Service: the patient was seen and examined on 12/17/2019  Brief hospital course: Past medical history of alcohol abuse, polysubstance abuse with cocaine and meth with occasional use of fentanyl, endocarditis of tricuspid valve, depression, suicidal tendencies, smoker. Recently admitted at [facility], admitted on 12/08/2019, left AMA on 12/09/2019. UDS negative for opiates in June 2021. Patient tells me that he is using occasional fentanyl but not on a regular basis.  His regular recreational drugs are cocaine and methamphetamine. CT chest without contrast Septic emboli. Ultrasound abdomen hepatomegaly Bilateral lower extremity Doppler negative for DVT with bilateral inguinal lymphadenopathy Echocardiogram severe TR large vegetation on posterior leaflet and small vegetation on anterior leaflet. UDS positive for cocaine, opiates and amphetamine. Hep C antibody reactive HIV nonreactive  Currently plan is further work-up for back involvement with MRSA bacteremia.  Assessment and Plan: 1.  Back pain. Shortness of breath. 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 Currently on IV vancomycin And will likely require long-term IV antibiotics. Recently was seen at [facility] with different identity Alex [name]. MRI spine ordered under anesthesia.  CT surgery consulted.  Felt that the endocarditis appears to be chronic in nature and patient will benefit less from Angiovac.  Currently recommend monitoring. Should the patient have ongoing fever or persistent bacteremia at that point they will consider intervention. Patient will benefit from tricuspid valve endocarditis but given his ongoing IV drug abuse not a good candidate. Left upper extremity also swollen.  Doppler negative. We will check x-ray.   2.  Anemia.  Iron  deficiency. Continue oral iron.  Continue vitamin C.  Monitor daily CBC.  3.  Polysubstance abuse. UDS positive for amphetamine and opiates. Outside UDS also positive for benzodiazepine.  4.  Pain control. Withdrawal symptoms. Difficult to control the symptoms for now. Patient doing acute pain secondary to back pain likely from discitis or osteomyelitis involvement from MRSA endocarditis. Therefore currently holding off on the Suboxone therapy for opiates part. Patient used 22 morphine equivalent dose on 12/15/2019. He used 23 morphine equivalent dose on 12/16/2019. Also patient consistently not using fentanyl and he mentions that fentanyl is only part of the other medication that he uses consistently. Continue as needed Ativan. Continue scheduled gabapentin. Scheduled Toradol.  5.  Elevated LFT. Currently stable. Monitor for now.  6.  Hypoalbuminemia. Likely poor nutrition. Monitor for now.  7.  Insomnia and anxiety. Continue as needed Ativan.  Continue scheduled gabapentin.  8.  Mild sinus tachycardia. Monitor for now protection may require introduction of clonidine  Body mass index is 21.09 kg/m.    Interventions:        Diet: Cardiac diet. DVT Prophylaxis:   enoxaparin (LOVENOX) injection 40 mg Start: 12/15/19 2200 Place and maintain sequential compression device Start: 12/14/19 1121    Advance goals of care discussion: Full code  Family Communication: family was present at bedside, at the time of interview.  The pt provided permission to discuss medical plan with the family. Opportunity was given to ask question and all questions were answered satisfactorily.   Disposition:  Status is: Inpatient  Remains inpatient appropriate because:Inpatient level of care appropriate due to severity of illness   Dispo: The patient is from: Home              Anticipated d/c is to: Home  Anticipated d/c date is: > 3 days              Patient currently is not  medically stable to d/c.        Subjective: Reports pain still not controlled.  No nausea or vomiting.  No fever no chills.  Reports fatigue and tiredness.  Reports anxiety.  Unable to sleep.  Physical Exam: General: Appear in mild distress, no Rash; Oral Mucosa Clear, moist. no Abnormal Neck Mass Or lumps, Conjunctiva normal  Cardiovascular: S1 and S2 Present, aortic systolic  Murmur, Respiratory: increased respiratory effort, Bilateral Air entry present and bilateral  Crackles, no wheezes Abdomen: Bowel Sound present, Soft and no tenderness Extremities: no Pedal edema Neurology: alert and oriented to time, place, and person affect anxious. no new focal deficit Gait not checked due to patient safety concerns   Vitals:   12/16/19 2113 12/17/19 0557 12/17/19 0838 12/17/19 1239  BP: 113/78 90/64 104/68 106/73  Pulse: 90 92  (!) 106  Resp: 20 20  17   Temp: 97.9 F (36.6 C) 98.7 F (37.1 C)  99.5 F (37.5 C)  TempSrc:      SpO2: 98% 96%  94%  Weight:      Height:        Intake/Output Summary (Last 24 hours) at 12/17/2019 2017 Last data filed at 12/17/2019 1700 Gross per 24 hour  Intake 1260 ml  Output 600 ml  Net 660 ml   Filed Weights   12/14/19 0330 12/15/19 0500 12/16/19 0429  Weight: 54 kg 56.7 kg 62.9 kg    Data Reviewed: I have personally reviewed and interpreted daily labs, tele strips, imagings as discussed above. I reviewed all nursing notes, pharmacy notes, vitals, pertinent old records I have discussed plan of care as described above with RN and patient/family.  CBC: Recent Labs  Lab 12/14/19 0810 12/14/19 1252 12/15/19 0518 12/16/19 0424 12/16/19 1033 12/16/19 1715 12/17/19 0447  WBC 22.9*  --  28.7* 18.3* 19.5* 20.0* 18.6*  NEUTROABS 18.7*  --   --  13.1* 14.8*  --   --   HGB 7.3*   < > 8.0* 7.1* 7.4* 7.3* 7.3*  HCT 21.7*   < > 27.9* 22.5* 24.2* 23.6* 23.4*  MCV 86.5  --  98.2 88.6 90.0 90.4 90.7  PLT 315  --  432* 374 404* 381 394   < > =  values in this interval not displayed.   Basic Metabolic Panel: Recent Labs  Lab 12/14/19 0810 12/15/19 0518 12/16/19 0424 12/17/19 0447  NA 131* 131* 132* 134*  K 3.7 4.1 3.9 3.9  CL 100 100 102 104  CO2 23 21* 23 22  GLUCOSE 91 86 106* 101*  BUN 17 16 15 17   CREATININE 0.97 0.89 0.84 0.83  CALCIUM 7.5* 7.3* 7.1* 7.4*  MG  --   --  1.8  --   PHOS  --   --  3.0  --     Studies: VAS 02/16/20 UPPER EXTREMITY VENOUS DUPLEX  Result Date: 12/17/2019 UPPER VENOUS STUDY  Indications: Swelling, and IV drug abuse. Limitations: Line and patient not fully cooperative. Comparison Study: No prior study on file Performing Technologist: [place] RVS  Examination Guidelines: A complete evaluation includes B-mode imaging, spectral Doppler, color Doppler, and power Doppler as needed of all accessible portions of each vessel. Bilateral testing is considered an integral part of a complete examination. Limited examinations for reoccurring indications may be performed as noted.  Left Findings: +----------+------------+---------+-----------+----------+--------------+  LEFT       Compressible Phasicity Spontaneous Properties    Summary      +----------+------------+---------+-----------+----------+--------------+  IJV            Full        Yes        Yes                                +----------+------------+---------+-----------+----------+--------------+  Subclavian                 Yes        Yes                                +----------+------------+---------+-----------+----------+--------------+  Axillary       Full                                                      +----------+------------+---------+-----------+----------+--------------+  Brachial       Full        Yes        Yes                                +----------+------------+---------+-----------+----------+--------------+  Cephalic                                                 Not visualized   +----------+------------+---------+-----------+----------+--------------+  Basilic        Full                                                      +----------+------------+---------+-----------+----------+--------------+  Summary:  Left: No evidence of deep vein thrombosis in the upper extremity. No evidence of superficial vein thrombosis in the upper extremity. Cephalic vein not visualized. Tissue appears disturbed throughout the arm. etiology unknown.  *See table(s) above for measurements and observations.  Diagnosing physician: Sherald Hesshristopher Clark MD Electronically signed by Sherald Hesshristopher Clark MD on 12/17/2019 at 3:16:53 PM.    Final     Scheduled Meds:  vitamin C  500 mg Oral Daily   enoxaparin (LOVENOX) injection  40 mg Subcutaneous Q24H   ferrous sulfate  325 mg Oral BID WC   gabapentin  300 mg Oral TID   ketorolac  30 mg Intravenous Q6H   melatonin  3 mg Oral QHS   sodium chloride flush  3 mL Intravenous Q12H   Continuous Infusions:  sodium chloride Stopped (12/17/19 1915)   vancomycin Stopped (12/17/19 1915)   PRN Meds: sodium chloride, acetaminophen, HYDROcodone-acetaminophen, HYDROmorphone (DILAUDID) injection, LORazepam, nitroGLYCERIN, ondansetron (ZOFRAN) IV, sodium chloride flush  Time spent: 35 minutes  Author: Lynden OxfordPranav Treson Laura, MD [facility] 12/17/2019 8:17 PM  To reach On-call, see care teams to locate the attending and reach out via [email or web address]. Between 7PM-7AM, please contact night-coverage If you still have difficulty reaching the attending provider, please page the [facility] (Director on  Call) for [facility] on amion for assistance.

## 2019-12-17 NOTE — Progress Notes (Signed)
Patient scheduled for MRI at [facility] tomorrow 10/8 at 1230pm.  PT to arrive at short stay at 1130am.  [facility] called and transport set for patient to be picked up at [facility] @ 11am.  PT to be NPO after midnight due to sedation prior to MRI tomorrow.  Patient and night RN made aware.

## 2019-12-17 NOTE — Progress Notes (Signed)
     [address]       [place] [address]             [phone]        Echocardiogram reviewed. Patient has a large broad-based vegetation on his tricuspid valve which is likely chronic.  He also has a flail segment of the septal leaflet.  Given the characteristic angio VAC debridement would likely yield a minimal result.  If his blood cultures remain positive, and he continues to spike fevers then we can consider an angio VAC debridement but what he ultimately will need is tricuspid valve replacement.  This however is associated with high risk of prosthetic valve endocarditis in a patient with ongoing IV drug abuse.  We will follow up on repeat blood cultures. Continue medical therapy for now.  Tabb Croghan Keane Scrape

## 2019-12-17 NOTE — Progress Notes (Signed)
Chaplain came to do follow-up with AD. Patient said "Oh, I did that yesterday."  "I'm really tired right now. Can you come back later?"  Chaplain then consulted with nurse and progress notes. It seems that patient requested the paperwork when his father was bedside on 10/6  but it was not completed.  Chaplain wondered need for completion of paperwork in these Covid times (difficulties with finding witnesses)  since patient desires for his father, NOK, to speak on his behalf if he is unable.  Nurse will page if patient's father comes and patient desires to complete AD. Rev. Lynnell Chad Pager [phone]

## 2019-12-17 NOTE — Progress Notes (Signed)
VASCULAR LAB    Left upper extremity venous duplex has been performed.  See CV proc for preliminary results.   Adama Ferber, RVT 12/17/2019, 12:25 PM

## 2019-12-18 ENCOUNTER — Ambulatory Visit (HOSPITAL_COMMUNITY): Payer: Self-pay

## 2019-12-18 ENCOUNTER — Ambulatory Visit (HOSPITAL_COMMUNITY): Admit: 2019-12-18 | Payer: Self-pay

## 2019-12-18 ENCOUNTER — Inpatient Hospital Stay (HOSPITAL_COMMUNITY): Payer: Self-pay

## 2019-12-18 ENCOUNTER — Encounter (HOSPITAL_COMMUNITY)
Admission: EM | Disposition: A | Discharge status: Home / Self Care | Payer: Self-pay | Source: Home / Self Care | Attending: Internal Medicine

## 2019-12-18 ENCOUNTER — Other Ambulatory Visit (HOSPITAL_COMMUNITY): Payer: Self-pay

## 2019-12-18 ENCOUNTER — Ambulatory Visit (HOSPITAL_COMMUNITY)
Admit: 2019-12-18 | Discharge: 2019-12-18 | Disposition: A | Discharge status: Home / Self Care | Payer: Self-pay | Attending: Internal Medicine | Admitting: Internal Medicine

## 2019-12-18 ENCOUNTER — Other Ambulatory Visit: Payer: Self-pay

## 2019-12-18 ENCOUNTER — Inpatient Hospital Stay (HOSPITAL_COMMUNITY): Payer: Self-pay | Admitting: Certified Registered Nurse Anesthetist

## 2019-12-18 ENCOUNTER — Encounter (HOSPITAL_COMMUNITY): Payer: Self-pay | Admitting: Internal Medicine

## 2019-12-18 HISTORY — PX: RADIOLOGY WITH ANESTHESIA: SHX6223

## 2019-12-18 LAB — HCV RNA QUANT
HCV Quantitative Log: 5.236 log10 IU/mL (ref >1.70)
HCV Quantitative: 172000 IU/mL (ref >50)

## 2019-12-18 LAB — COMPREHENSIVE METABOLIC PANEL
ALT: 79 U/L — ABNORMAL HIGH (ref 0–44)
AST: 85 U/L — ABNORMAL HIGH (ref 15–41)
Albumin: 1.3 g/dL — ABNORMAL LOW (ref 3.5–5.0)
Alkaline Phosphatase: 238 U/L — ABNORMAL HIGH (ref 38–126)
Anion gap: 9 (ref 5–15)
BUN: 14 mg/dL (ref 6–20)
CO2: 20 mmol/L — ABNORMAL LOW (ref 22–32)
Calcium: 7.5 mg/dL — ABNORMAL LOW (ref 8.9–10.3)
Chloride: 104 mmol/L (ref 98–111)
Creatinine, Ser: 0.8 mg/dL (ref 0.61–1.24)
GFR calc non Af Amer: >60 mL/min (ref >60)
Glucose, Bld: 110 mg/dL — ABNORMAL HIGH (ref 70–99)
Potassium: 3.9 mmol/L (ref 3.5–5.1)
Sodium: 133 mmol/L — ABNORMAL LOW (ref 135–145)
Total Bilirubin: 0.9 mg/dL (ref 0.3–1.2)
Total Protein: 6.6 g/dL (ref 6.5–8.1)

## 2019-12-18 LAB — CULTURE, BLOOD (ROUTINE X 2)

## 2019-12-18 LAB — CBC
HCT: 23.7 % — ABNORMAL LOW (ref 39.0–52.0)
Hemoglobin: 7.2 g/dL — ABNORMAL LOW (ref 13.0–17.0)
MCH: 28.1 pg (ref 26.0–34.0)
MCHC: 30.4 g/dL (ref 30.0–36.0)
MCV: 92.6 fL (ref 80.0–100.0)
Platelets: 403 10*3/uL — ABNORMAL HIGH (ref 150–400)
RBC: 2.56 MIL/uL — ABNORMAL LOW (ref 4.22–5.81)
RDW: 19.2 % — ABNORMAL HIGH (ref 11.5–15.5)
WBC: 20.4 10*3/uL — ABNORMAL HIGH (ref 4.0–10.5)
nRBC: 0 % (ref 0.0–0.2)

## 2019-12-18 IMAGING — US US ABDOMEN LIMITED
1 series · 14 of 25 positions shown · non-contrast
Comparison: None.

CLINICAL DATA: Elevated liver enzymes.

EXAM:
ULTRASOUND ABDOMEN LIMITED RIGHT UPPER QUADRANT

[Series 1: us abdomen limited · 14 of 36 slices shown]
[im 1/36]
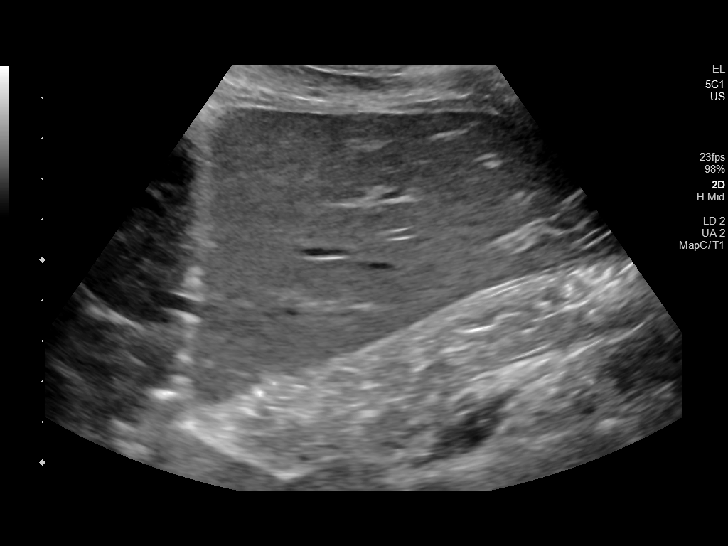
[im 3/36]
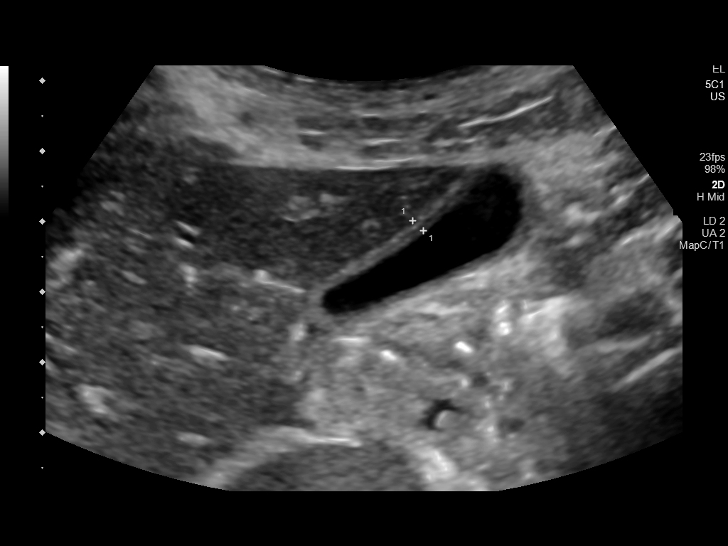
[im 6/36]
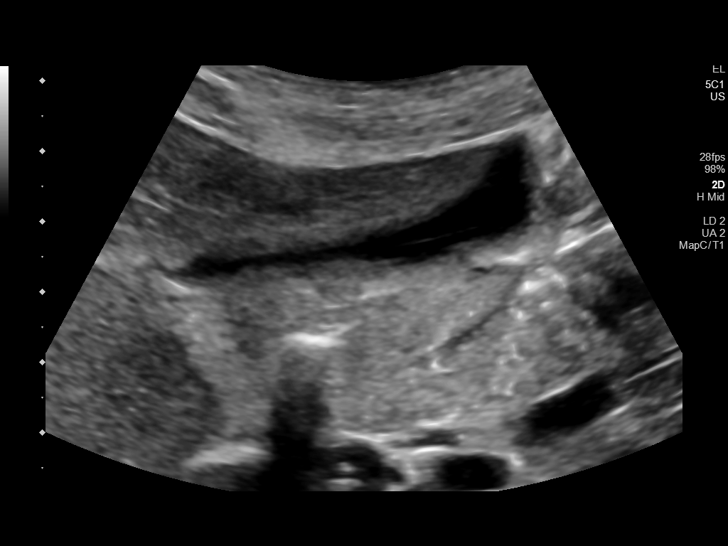
[im 9/36]
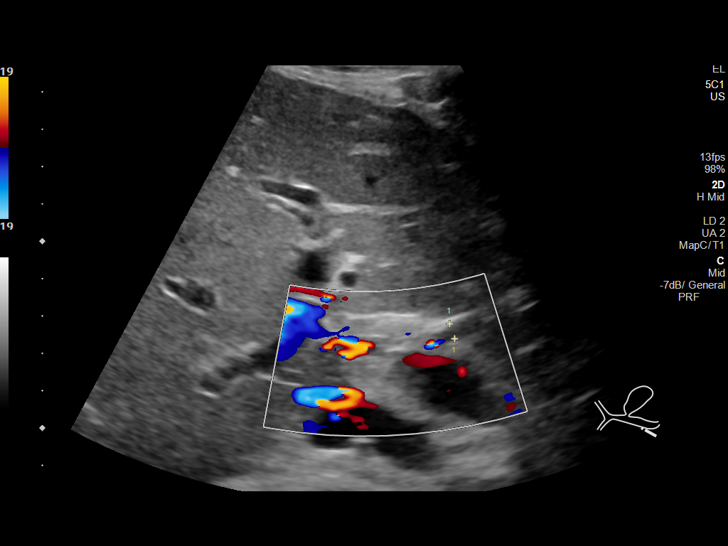
[im 12/36]
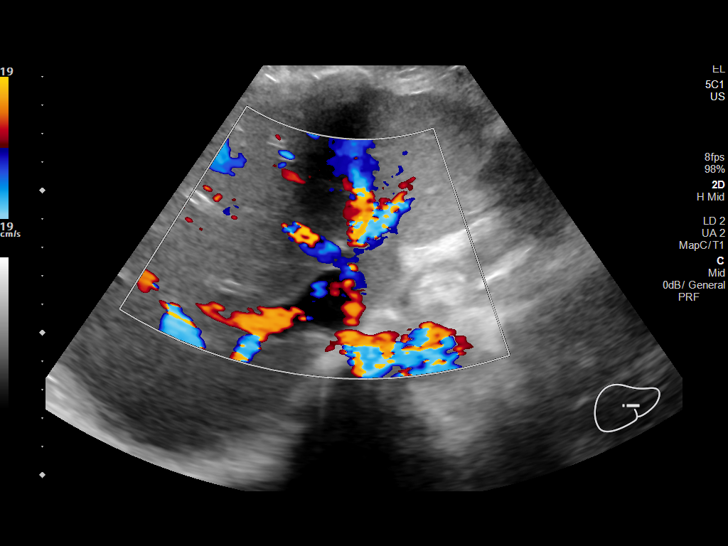
[im 14/36]
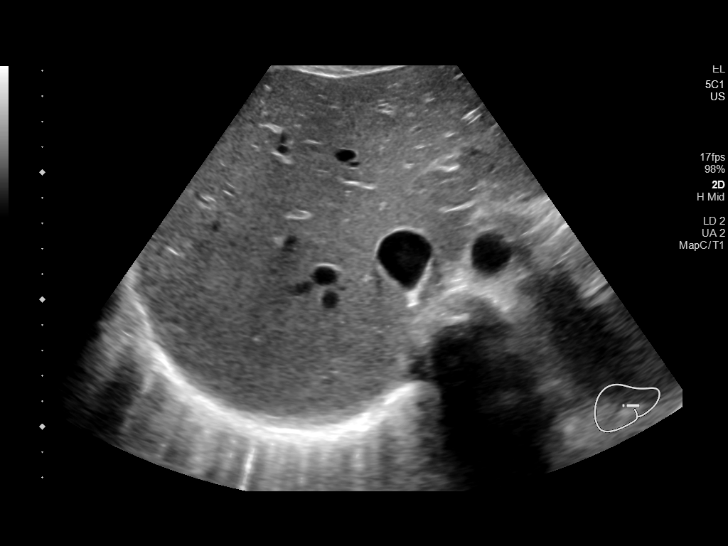
[im 17/36]
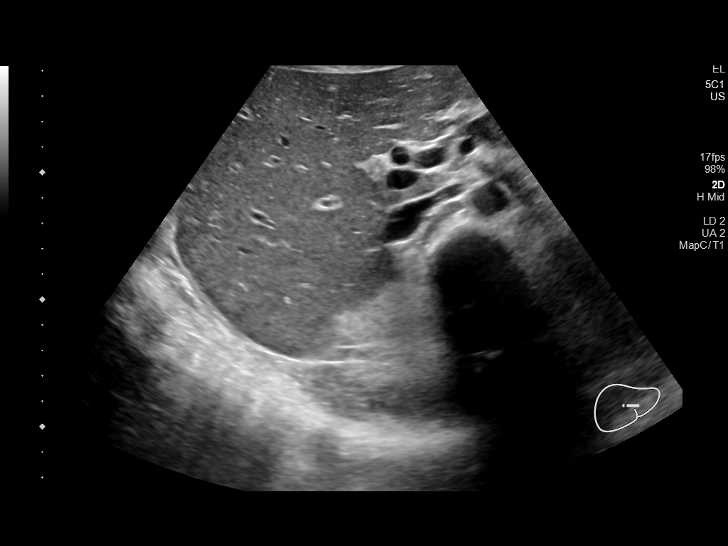
[im 19/36]
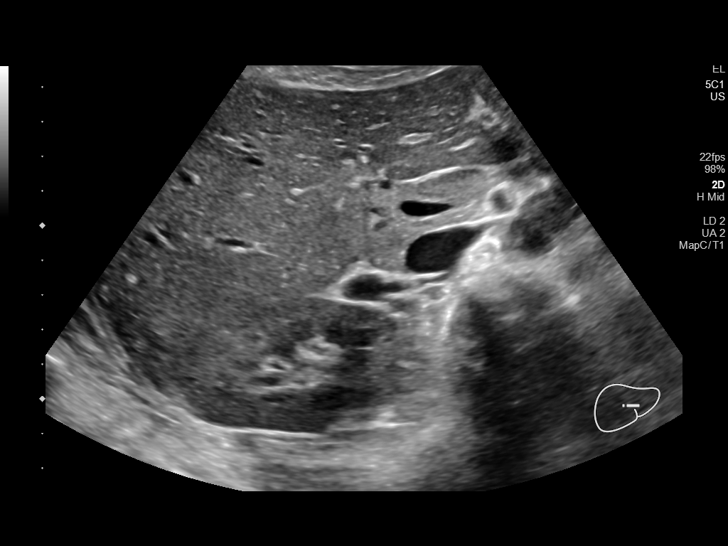
[im 22/36]
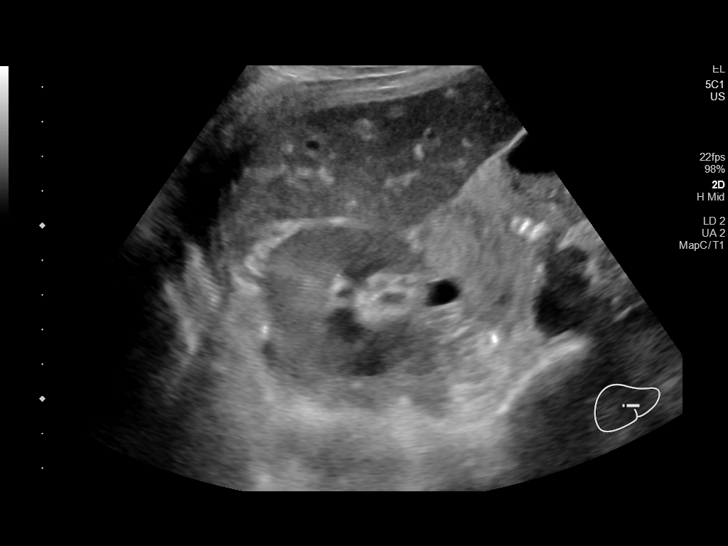
[im 24/36]
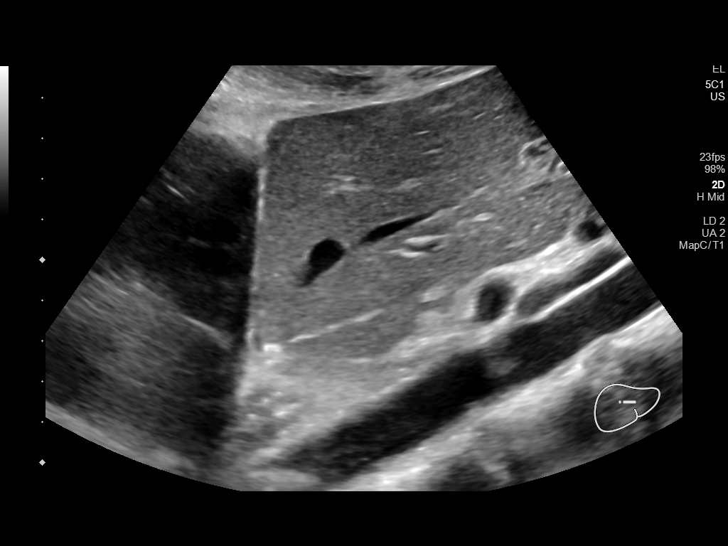
[im 27/36]
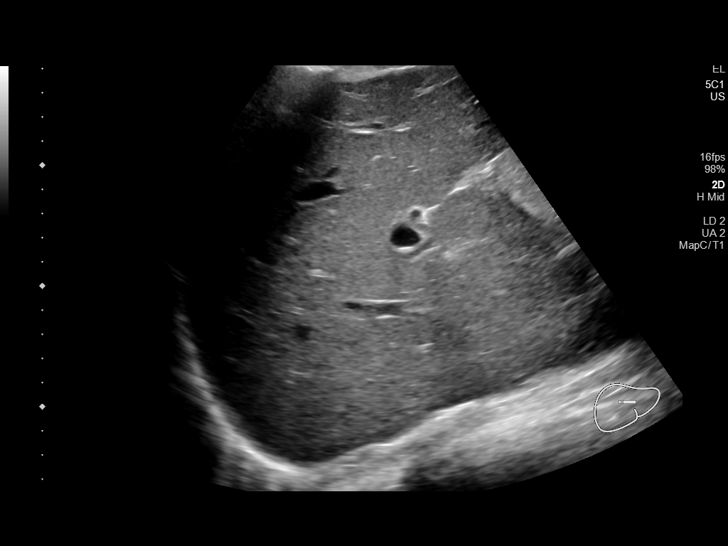
[im 30/36]
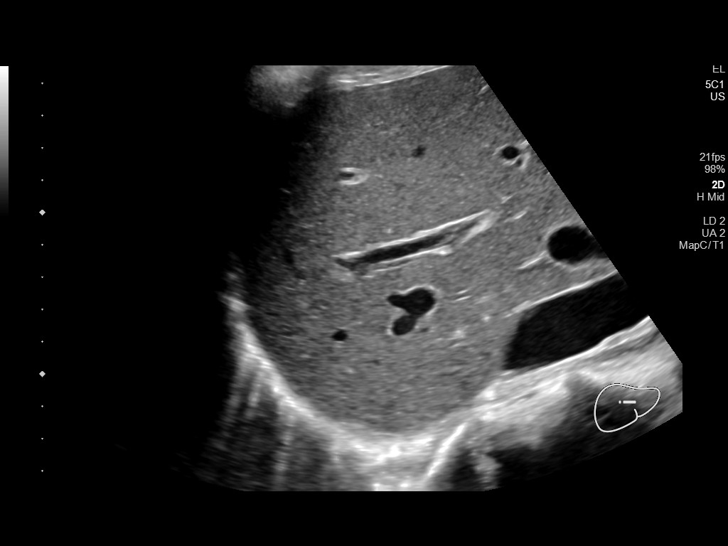
[im 33/36]
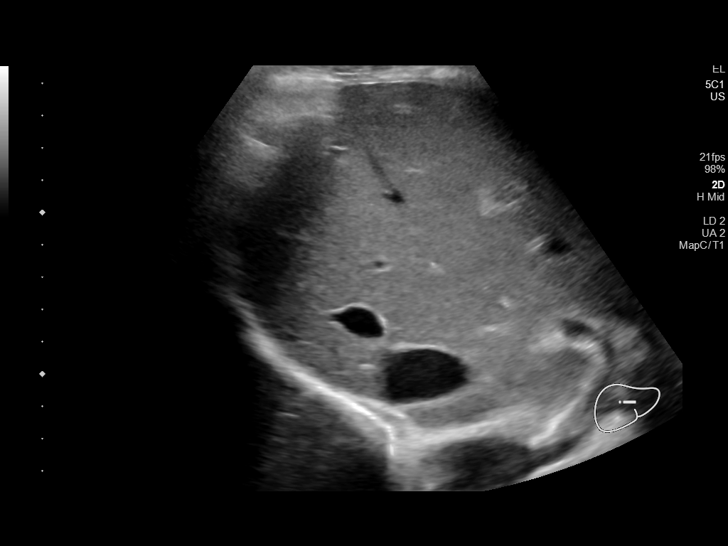
[im 36/36]
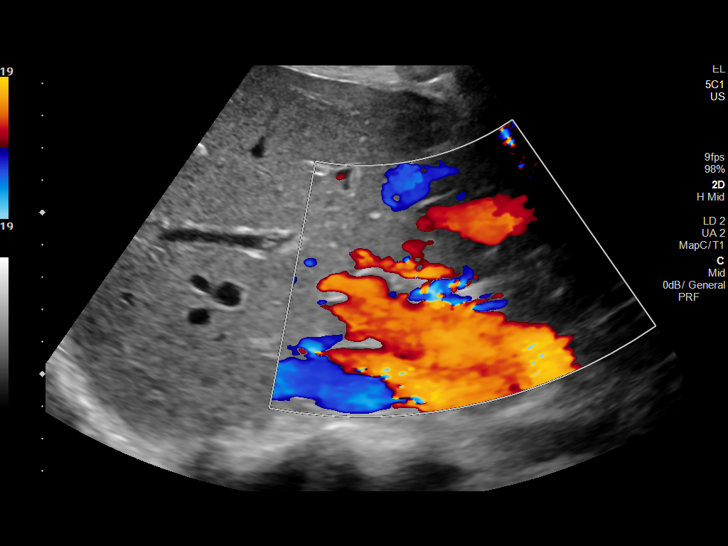

[14 of 25 positions shown; findings below may reference images not displayed]

FINDINGS: Gallbladder:

No gallstones or wall thickening visualized. No sonographic Murphy
sign noted by sonographer.

Common bile duct:

Diameter: 4.2 mm

Liver:

No focal lesion identified. Within normal limits in parenchymal
echogenicity. Portal vein is patent on color Doppler imaging with
normal direction of blood flow towards the liver.

Other: None.
IMPRESSION: Normal study.

## 2019-12-18 IMAGING — CR DG CHEST 2V
2 series · 2 of 2 positions shown · non-contrast
Comparison: 11/21/2018; 04/26/2014

CLINICAL DATA: Generalized body aches.  Drug abuse.

EXAM:
CHEST - 2 VIEW

[w chest pa]
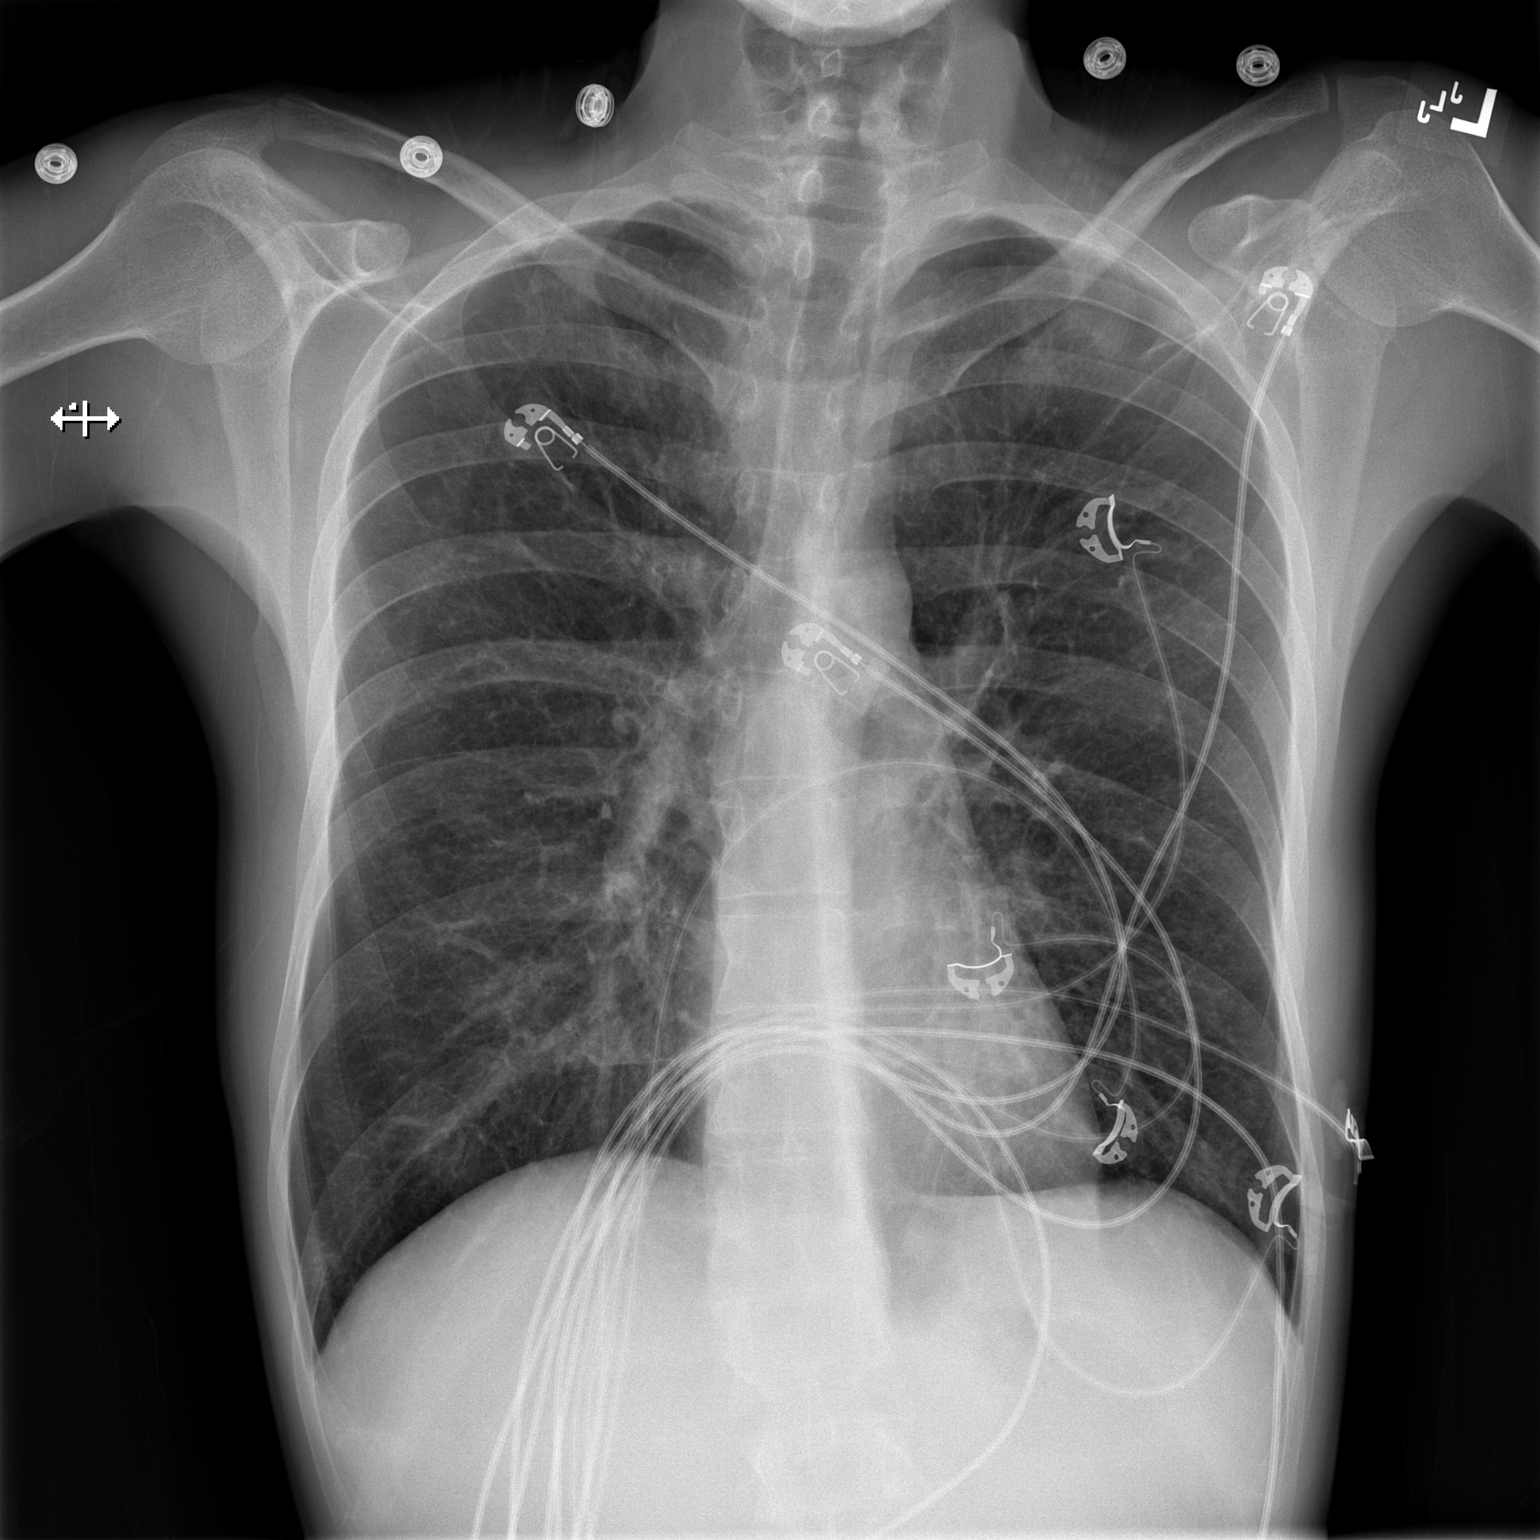

[w chest lat]
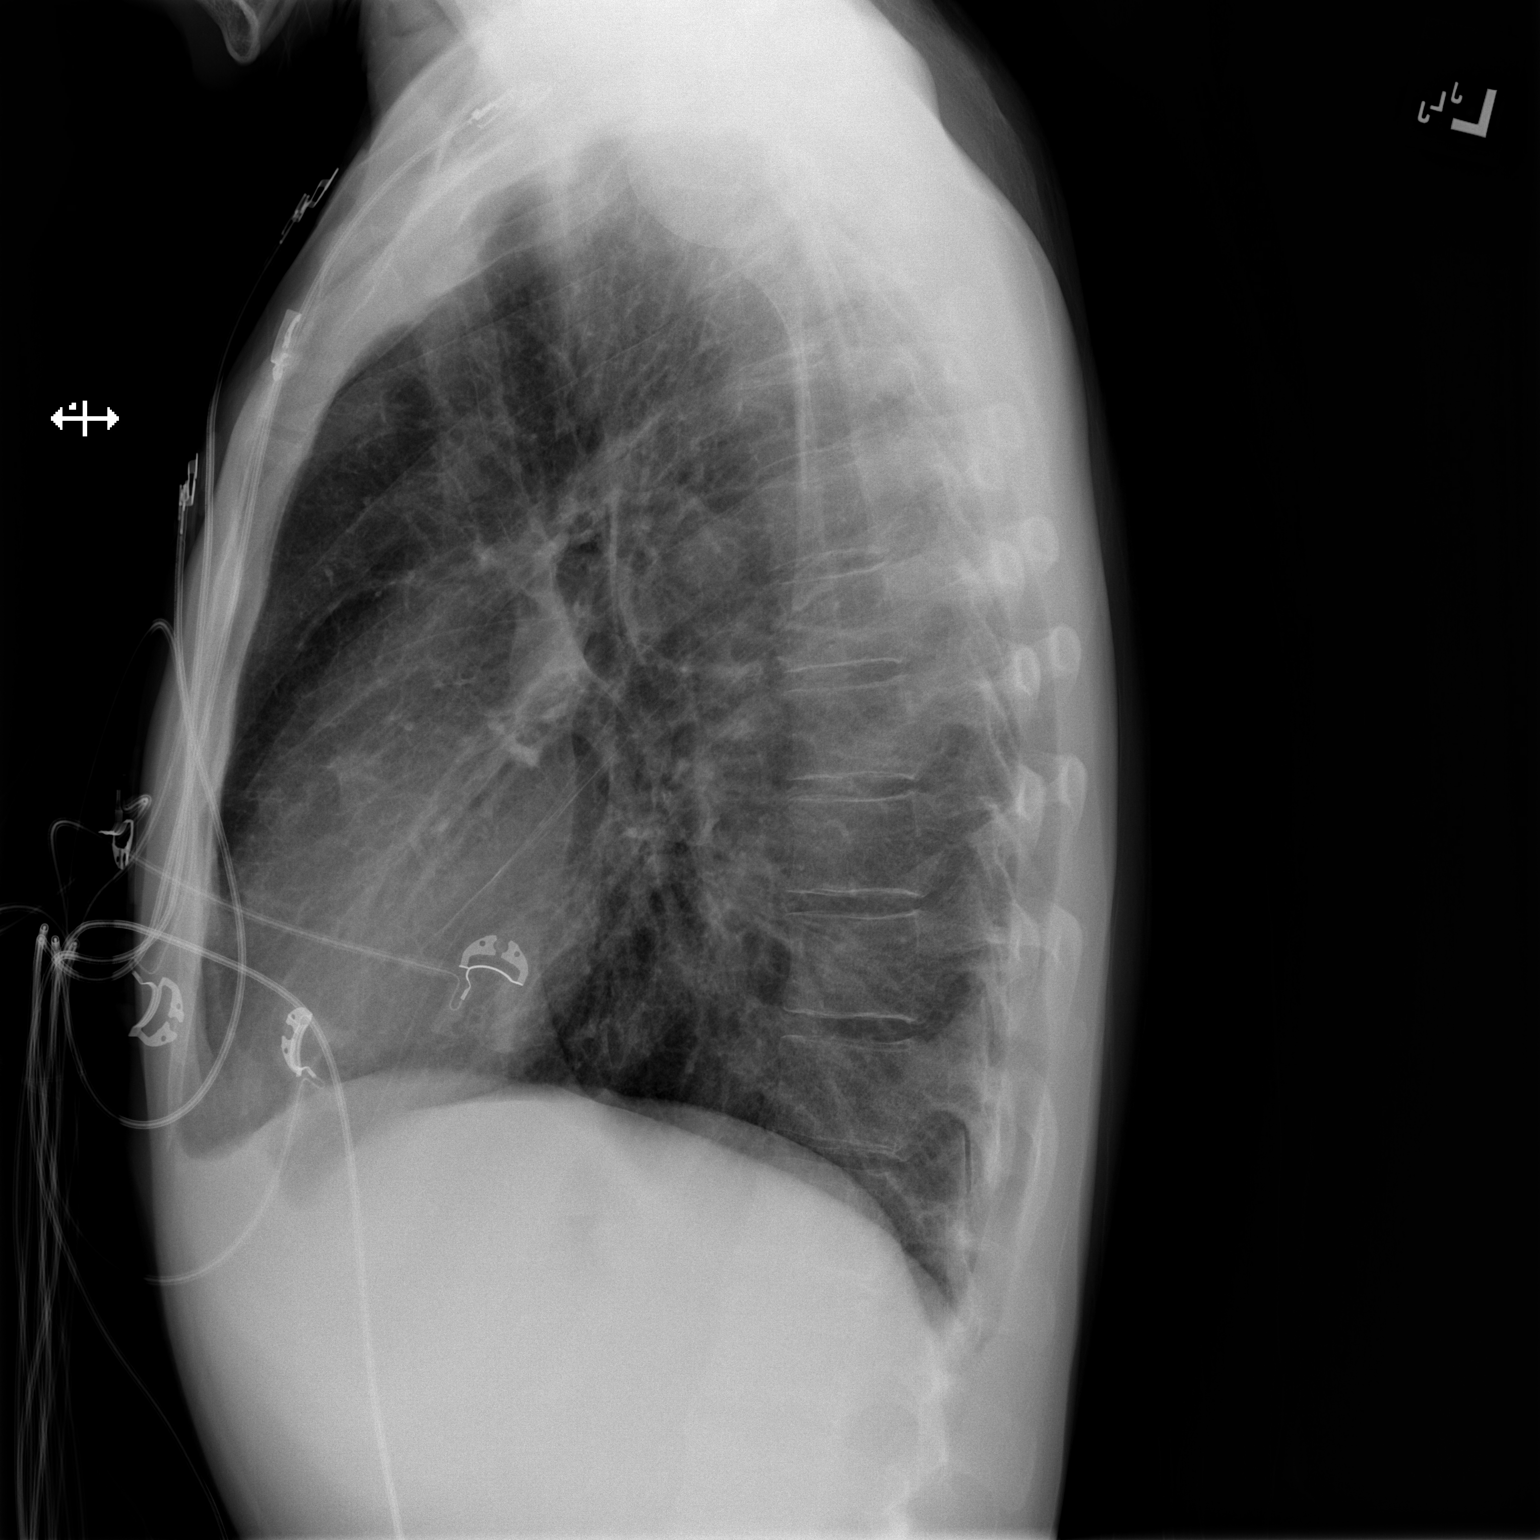

[2 of 2 positions shown; findings below may reference images not displayed]

FINDINGS: Grossly unchanged cardiac silhouette and mediastinal contours. The
lungs appear hyperexpanded with flattening of the diaphragms mild
diffuse slightly nodular thickening of the pulmonary interstitium.
No discrete focal airspace opacities. No pleural effusion or
pneumothorax. No evidence of edema. No acute osseous abnormalities.
IMPRESSION: Similar findings of lung hyperexpansion and bronchitic change
without superimposed acute cardiopulmonary disease.

## 2019-12-18 SURGERY — RADIOLOGY WITH ANESTHESIA
Anesthesia: General

## 2019-12-18 MED ORDER — ENSURE ENLIVE PO LIQD
237.0000 mL | Freq: Three times a day (TID) | ORAL | Status: DC
  Administered 2019-12-18 – 2020-01-19 (×64): 237 mL via ORAL

## 2019-12-18 MED ORDER — PROPOFOL 10 MG/ML IV BOLUS
INTRAVENOUS | Status: DC | PRN
  Administered 2019-12-18: 140 mg via INTRAVENOUS

## 2019-12-18 MED ORDER — MELATONIN 5 MG PO TABS
5.0000 mg | ORAL_TABLET | Freq: Every day | ORAL | Status: DC
  Administered 2019-12-18 – 2019-12-24 (×7): 5 mg via ORAL
  Filled 2019-12-18 (×7): qty 1

## 2019-12-18 MED ORDER — LIDOCAINE 2% (20 MG/ML) 5 ML SYRINGE
INTRAMUSCULAR | Status: DC | PRN
  Administered 2019-12-18: 60 mg via INTRAVENOUS

## 2019-12-18 MED ORDER — CHLORHEXIDINE GLUCONATE 0.12 % MT SOLN
OROMUCOSAL | Status: AC
  Administered 2019-12-18: 15 mL via OROMUCOSAL
  Filled 2019-12-18: qty 15

## 2019-12-18 MED ORDER — FENTANYL CITRATE (PF) 100 MCG/2ML IJ SOLN
INTRAMUSCULAR | Status: AC
Start: 2019-12-18 — End: 2019-12-19
  Filled 2019-12-18: qty 2

## 2019-12-18 MED ORDER — PHENYLEPHRINE 40 MCG/ML (10ML) SYRINGE FOR IV PUSH (FOR BLOOD PRESSURE SUPPORT)
PREFILLED_SYRINGE | INTRAVENOUS | Status: DC | PRN
  Administered 2019-12-18 (×3): 120 ug via INTRAVENOUS
  Administered 2019-12-18: 80 ug via INTRAVENOUS
  Administered 2019-12-18: 120 ug via INTRAVENOUS

## 2019-12-18 MED ORDER — LEVALBUTEROL TARTRATE 45 MCG/ACT IN AERO
1.0000 | INHALATION_SPRAY | Freq: Four times a day (QID) | RESPIRATORY_TRACT | Status: DC | PRN
  Administered 2019-12-18 – 2019-12-31 (×5): 1 via RESPIRATORY_TRACT
  Filled 2019-12-18: qty 15

## 2019-12-18 MED ORDER — FENTANYL CITRATE (PF) 100 MCG/2ML IJ SOLN
25.0000 ug | INTRAMUSCULAR | Status: DC | PRN
  Administered 2019-12-18 (×2): 50 ug via INTRAVENOUS

## 2019-12-18 MED ORDER — HYDROCODONE-ACETAMINOPHEN 5-325 MG PO TABS
1.0000 | ORAL_TABLET | ORAL | Status: DC | PRN
  Administered 2019-12-18 – 2019-12-24 (×18): 1 via ORAL
  Filled 2019-12-18 (×18): qty 1

## 2019-12-18 MED ORDER — TRAZODONE HCL 50 MG PO TABS
50.0000 mg | ORAL_TABLET | Freq: Every day | ORAL | Status: DC
  Administered 2019-12-18 – 2020-01-18 (×32): 50 mg via ORAL
  Filled 2019-12-18 (×34): qty 1

## 2019-12-18 MED ORDER — EPHEDRINE SULFATE-NACL 50-0.9 MG/10ML-% IV SOSY
PREFILLED_SYRINGE | INTRAVENOUS | Status: DC | PRN
  Administered 2019-12-18: 5 mg via INTRAVENOUS

## 2019-12-18 MED ORDER — GADOBUTROL 1 MMOL/ML IV SOLN
6.0000 mL | Freq: Once | INTRAVENOUS | Status: AC | PRN
  Administered 2019-12-18: 6 mL via INTRAVENOUS

## 2019-12-18 MED ORDER — ALBUMIN HUMAN 5 % IV SOLN: INTRAVENOUS | Status: DC | PRN

## 2019-12-18 MED ORDER — ONDANSETRON HCL 4 MG/2ML IJ SOLN: 4.0000 mg | Freq: Once | INTRAMUSCULAR | Status: DC | PRN

## 2019-12-18 MED ORDER — CHLORHEXIDINE GLUCONATE 0.12 % MT SOLN
15.0000 mL | OROMUCOSAL | Status: AC
  Filled 2019-12-18: qty 15

## 2019-12-18 MED ORDER — MIDAZOLAM HCL 2 MG/2ML IJ SOLN
INTRAMUSCULAR | Status: DC | PRN
  Administered 2019-12-18: 2 mg via INTRAVENOUS

## 2019-12-18 MED ORDER — LACTATED RINGERS IV SOLN: INTRAVENOUS | Status: DC

## 2019-12-18 NOTE — Anesthesia Procedure Notes (Signed)
Procedure Name: LMA Insertion Date/Time: 12/18/2019 12:33 PM Performed by: Mayer Camel, CRNA Pre-anesthesia Checklist: Patient identified, Emergency Drugs available, Suction available and Patient being monitored Patient Re-evaluated:Patient Re-evaluated prior to induction Oxygen Delivery Method: Circle System Utilized Preoxygenation: Pre-oxygenation with 100% oxygen Induction Type: IV induction Ventilation: Mask ventilation without difficulty LMA: LMA inserted LMA Size: 4.0 Number of attempts: 1 Placement Confirmation: positive ETCO2 Tube secured with: Tape Dental Injury: Teeth and Oropharynx as per pre-operative assessment

## 2019-12-18 NOTE — Transfer of Care (Signed)
Immediate Anesthesia Transfer of Care Note  Patient: Alex [name]  Procedure(s) Performed: MRI-THORACIC MRI WITH AND WITHOUT CONSTRAST (N/A )  Patient Location: PACU  Anesthesia Type:General  Level of Consciousness: awake, alert  and oriented  Airway & Oxygen Therapy: Patient Spontanous Breathing and Patient connected to face mask oxygen  Post-op Assessment: Report given to RN and Post -op Vital signs reviewed and stable  Post vital signs: Reviewed and stable  Last Vitals:  Vitals Value Taken Time  BP 109/76 12/18/19 1442  Temp 36.7 C 12/18/19 1440  Pulse 95 12/18/19 1448  Resp 19 12/18/19 1448  SpO2 94 % 12/18/19 1448  Vitals shown include unvalidated device data.  Last Pain:  Vitals:   12/18/19 1440  TempSrc:   PainSc: Asleep      Patients Stated Pain Goal: 2 (12/17/19 1828)  Complications: No complications documented.

## 2019-12-18 NOTE — Progress Notes (Signed)
Notifed Dr. Allena Katz that patient was having acute pain that was unexplainable. All prn pain meds are not due at this time, already have been administered. Po ativan given to patient r/t anxiety. Patient shaking vigorously, adjusted temp in room. All vitals stable at this time. Oxygen via nasal cannula given for comfort/ anxiety. Room air 94%. On 3 liters [place] 100%.

## 2019-12-18 NOTE — Progress Notes (Signed)
[facility] Progress Note  Patient: Alex [name]    DJS:[id number]  DOA: 12/14/2019     Date of Service: the patient was seen and examined on 12/18/2019  Brief hospital course: Past medical history of alcohol abuse, polysubstance abuse with cocaine and meth with occasional use of fentanyl, endocarditis of tricuspid valve, depression, suicidal tendencies, smoker. Recently admitted at [facility], admitted on 12/08/2019, left AMA on 12/09/2019. UDS negative for opiates in June 2021. Patient tells me that he is using occasional fentanyl but not on a regular basis.  His regular recreational drugs are cocaine and methamphetamine. CT chest without contrast Septic emboli. Ultrasound abdomen hepatomegaly Bilateral lower extremity Doppler negative for DVT with bilateral inguinal lymphadenopathy Echocardiogram severe TR large vegetation on posterior leaflet and small vegetation on anterior leaflet. UDS positive for cocaine, opiates and amphetamine. Hep C antibody reactive HIV nonreactive Per cardiothoracic surgery not a candidate for surgical intervention for now. Currently plan is continue IV biotics.  Assessment and Plan: 1.    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 Shortness of breath. ID consulted. Currently on IV vancomycin And will likely require long-term IV antibiotics. Recently was seen at [facility] with different identity [name]. MRI spine ordered under anesthesia.   Results currently pending. CT surgery Dr. Cliffton Asters consulted.  Felt that the endocarditis appears to be chronic in nature and patient will benefit less from Angiovac.  Currently recommend monitoring. Should the patient have ongoing fever or persistent bacteremia at that point they will consider intervention. Also Patient will benefit from tricuspid valve endocarditis but given his ongoing IV drug abuse not a good  candidate. Left upper extremity also swollen.  Doppler negative.  X-ray shows soft tissue swelling.  No gas.  Monitor.  Currently no evidence of compartment syndrome.  2.  Anemia.  Iron deficiency. Continue oral iron.  Continue vitamin C.  Monitor daily CBC.  3.  Polysubstance abuse. UDS positive for amphetamine and opiates. Outside hospital UDS also positive for benzodiazepine as well.  4.  Pain control. Withdrawal symptoms. Difficult to control the symptoms for now. Potentially acute pain secondary to back pain likely from discitis or osteomyelitis from MRSA endocarditis. Therefore currently holding off on the Suboxone therapy for opiates part. Also patient consistently not using fentanyl and he mentions that fentanyl is only part of the other drugs cocaine and crystal meth that he uses consistently. Continue as needed Ativan. Continue scheduled gabapentin. Scheduled Toradol.  5.  Elevated LFT. Currently stable. Monitor for now. Right upper quadrant ultrasound in the outside facility unremarkable.  6.  Hypoalbuminemia. Likely poor nutrition. Monitor for now.  7.  Insomnia and anxiety. Continue as needed Ativan.  Continue scheduled gabapentin.  8.  Mild sinus tachycardia. Monitor for now protection may require introduction of clonidine  9.  Hep C infection. Currently not treated.  Management per ID.  10.  Bilateral inguinal lymphadenopathy. Seen on CT scan as well as clinical examination. Likely reactive.  Currently monitoring.  Diet: Regular diet DVT Prophylaxis:   enoxaparin (LOVENOX) injection 40 mg Start: 12/15/19 2200 Place and maintain sequential compression device Start: 12/14/19 1121    Advance goals of care discussion: Full code  Family Communication: family was present at bedside, at the time of interview.  The pt provided permission to discuss medical plan with the family. Opportunity was given to ask question and all questions were answered  satisfactorily.   Disposition:  Status is: Inpatient  Remains inpatient appropriate because:Unsafe d/c plan and IV treatments appropriate due to intensity of illness or inability to take PO   Dispo: The patient is from: Home              Anticipated d/c is to: Home              Anticipated d/c date is: > 3 days              Patient currently is not medically stable to d/c.  Subjective: Continues to have back and leg pain.  But after receiving pain medication mid conversation patient is drowsy.  No nausea no vomiting.  No diarrhea no constipation.  Continues to have left arm pain and swelling.  Also complains of some groin swelling.  Physical Exam:  General: Appear in mild distress, no Rash; Oral Mucosa Clear, moist. no Abnormal Neck Mass Or lumps, Conjunctiva normal  Cardiovascular: S1 and S2 Present, aortic systolic  Murmur, Respiratory: good respiratory effort, Bilateral Air entry present and bilateral  Crackles, no wheezes Abdomen: Bowel Sound present, Soft and no tenderness Extremities: no Pedal edema Neurology: alert and oriented to time, place, and person affect anxious. no new focal deficit Gait not checked due to patient safety concerns  Vitals:   12/18/19 1440 12/18/19 1455 12/18/19 1510 12/18/19 1556  BP: 109/76 (!) 91/56 100/70 98/72  Pulse: 96 95 95 95  Resp: 11 17 16 18   Temp: 98 F (36.7 C)  98.1 F (36.7 C) 97.6 F (36.4 C)  TempSrc:    Oral  SpO2: 100% 95% 97% 99%  Weight:      Height:        Intake/Output Summary (Last 24 hours) at 12/18/2019 1754 Last data filed at 12/18/2019 1700 Gross per 24 hour  Intake 850 ml  Output 1200 ml  Net -350 ml   Filed Weights   12/15/19 0500 12/16/19 0429 12/18/19 1133  Weight: 56.7 kg 62.9 kg 62.9 kg    Data Reviewed: I have personally reviewed and interpreted daily labs, tele strips, imagings as discussed above. I reviewed all nursing notes, pharmacy notes, vitals, pertinent old records I have discussed plan of  care as described above with RN and patient/family.  CBC: Recent Labs  Lab 12/14/19 0810 12/14/19 1252 12/16/19 0424 12/16/19 1033 12/16/19 1715 12/17/19 0447 12/18/19 0431  WBC 22.9*   < > 18.3* 19.5* 20.0* 18.6* 20.4*  NEUTROABS 18.7*  --  13.1* 14.8*  --   --   --   HGB 7.3*   < > 7.1* 7.4* 7.3* 7.3* 7.2*  HCT 21.7*   < > 22.5* 24.2* 23.6* 23.4* 23.7*  MCV 86.5   < > 88.6 90.0 90.4 90.7 92.6  PLT 315   < > 374 404* 381 394 403*   < > = values in this interval not displayed.   Basic Metabolic Panel: Recent Labs  Lab 12/14/19 0810 12/15/19 0518 12/16/19 0424 12/17/19 0447 12/18/19 0431  NA 131* 131* 132* 134* 133*  K 3.7 4.1 3.9 3.9 3.9  CL 100 100 102 104 104  CO2 23 21* 23 22 20*  GLUCOSE 91 86 106* 101* 110*  BUN 17 16 15 17 14   CREATININE 0.97 0.89 0.84 0.83 0.80  CALCIUM 7.5* 7.3* 7.1* 7.4* 7.5*  MG  --   --  1.8  --   --   PHOS  --   --  3.0  --   --     Studies: DG  Forearm Left  Result Date: 12/18/2019 CLINICAL DATA:  Soft tissue swelling EXAM: LEFT FOREARM - 2 VIEW COMPARISON:  None. FINDINGS: Frontal and lateral views obtained. There is soft tissue swelling in the proximal to mid forearm region without soft tissue air or radiopaque foreign body. No fracture or dislocation. Joint spaces appear normal. No erosive change or bony destruction. IMPRESSION: Soft tissue swelling in the proximal to mid forearm region. No soft tissue air or radiopaque foreign body. No bony abnormality. No appreciable arthropathy. Electronically Signed   By: Bretta Bang III M.D.   On: 12/18/2019 08:27    Scheduled Meds: . vitamin C  500 mg Oral Daily  . enoxaparin (LOVENOX) injection  40 mg Subcutaneous Q24H  . feeding supplement (ENSURE ENLIVE)  237 mL Oral TID BM  . fentaNYL      . ferrous sulfate  325 mg Oral BID WC  . gabapentin  300 mg Oral TID  . ketorolac  30 mg Intravenous Q6H  . melatonin  5 mg Oral QHS  . sodium chloride flush  3 mL Intravenous Q12H   Continuous  Infusions: . sodium chloride Stopped (12/17/19 1915)  . lactated ringers 10 mL/hr at 12/18/19 1227  . vancomycin 1,500 mg (12/18/19 0113)   PRN Meds: sodium chloride, acetaminophen, HYDROcodone-acetaminophen, HYDROmorphone (DILAUDID) injection, LORazepam, nitroGLYCERIN, ondansetron (ZOFRAN) IV, sodium chloride flush  Time spent: 35 minutes  Author: Lynden Oxford, MD [facility] 12/18/2019 5:54 PM  To reach On-call, see care teams to locate the attending and reach out via [email or web address]. Between 7PM-7AM, please contact night-coverage If you still have difficulty reaching the attending provider, please page the [facility] (Director on Call) for [facility] on amion for assistance.

## 2019-12-18 NOTE — Anesthesia Preprocedure Evaluation (Addendum)
Anesthesia Evaluation  Patient identified by MRN, date of birth, ID band Patient awake    Reviewed: Allergy & Precautions, NPO status , Patient's Chart, lab work & pertinent test results  Airway Mallampati: II  TM Distance: >3 FB Neck ROM: Full    Dental  (+) Chipped, Missing   Pulmonary asthma , Current Smoker and Patient abstained from smoking.,  10 pack year history    Pulmonary exam normal breath sounds clear to auscultation       Cardiovascular Normal cardiovascular exam+ Valvular Problems/Murmurs (Endocarditis of tricuspid valve)  Rhythm:Regular Rate:Normal     Neuro/Psych PSYCHIATRIC DISORDERS Depression  Back Pain    GI/Hepatic negative GI ROS, (+)     substance abuse  alcohol use, cocaine use, marijuana use and IV drug use, IV fentanyl, heroin, cocaine 6x/wk   Endo/Other  negative endocrine ROS  Renal/GU negative Renal ROS  negative genitourinary   Musculoskeletal negative musculoskeletal ROS (+) narcotic dependent  Abdominal   Peds  Hematology  (+) Blood dyscrasia, anemia ,   Anesthesia Other Findings 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, known TV endocarditis   Reproductive/Obstetrics negative OB ROS                            Anesthesia Physical Anesthesia Plan  ASA: III  Anesthesia Plan: General   Post-op Pain Management:    Induction: Intravenous  PONV Risk Score and Plan: 1 and Treatment may vary due to age or medical condition, Ondansetron and Dexamethasone  Airway Management Planned: Oral ETT  Additional Equipment: None  Intra-op Plan:   Post-operative Plan: Extubation in OR  Informed Consent: I have reviewed the patients History and Physical, chart, labs and discussed the procedure including the risks, benefits and alternatives for the proposed anesthesia with the patient or authorized representative who has indicated his/her understanding and acceptance.        Plan Discussed with: CRNA  Anesthesia Plan Comments:        Anesthesia Quick Evaluation

## 2019-12-18 NOTE — Progress Notes (Signed)
carelink report given to Karle Plumber.

## 2019-12-18 NOTE — Progress Notes (Signed)
Patient placed on yellow mews protocol due to low grade fever, and increased heart rate, charge nurse notified, PRN tylenol given for fever reduction.

## 2019-12-18 NOTE — Plan of Care (Signed)
  Problem: Education: Goal: Knowledge of General Education information will improve Description: Including pain rating scale, medication(s)/side effects and non-pharmacologic comfort measures Outcome: Progressing   Problem: Health Behavior/Discharge Planning: Goal: Ability to manage health-related needs will improve Outcome: Progressing   Problem: Clinical Measurements: Goal: Ability to maintain clinical measurements within normal limits will improve Outcome: Progressing   Problem: Clinical Measurements: Goal: Ability to maintain clinical measurements within normal limits will improve Outcome: Progressing Goal: Will remain free from infection Outcome: Progressing Goal: Diagnostic test results will improve Outcome: Progressing Goal: Cardiovascular complication will be avoided Outcome: Progressing   Problem: Activity: Goal: Risk for activity intolerance will decrease Outcome: Progressing   Problem: Nutrition: Goal: Adequate nutrition will be maintained Outcome: Progressing   Problem: Coping: Goal: Level of anxiety will decrease Outcome: Progressing   Problem: Pain Managment: Goal: General experience of comfort will improve Outcome: Progressing   Problem: Safety: Goal: Ability to remain free from injury will improve Outcome: Progressing

## 2019-12-18 NOTE — Progress Notes (Signed)
PRN inhaler given.

## 2019-12-18 NOTE — Anesthesia Postprocedure Evaluation (Signed)
Anesthesia Post Note  Patient: Alex [name]  Procedure(s) Performed: MRI-THORACIC MRI WITH AND WITHOUT CONSTRAST (N/A )     Patient location during evaluation: PACU Anesthesia Type: General Level of consciousness: awake and alert Pain management: pain level controlled Vital Signs Assessment: post-procedure vital signs reviewed and stable Respiratory status: spontaneous breathing, nonlabored ventilation and respiratory function stable Cardiovascular status: blood pressure returned to baseline and stable Postop Assessment: no apparent nausea or vomiting Anesthetic complications: no   No complications documented.  Last Vitals:  Vitals:   12/18/19 1455 12/18/19 1510  BP: (!) 91/56 100/70  Pulse: 95 95  Resp: 17 16  Temp:  36.7 C  SpO2: 95% 97%    Last Pain:  Vitals:   12/18/19 1515  TempSrc:   PainSc: 5                  Cecile Hearing

## 2019-12-18 NOTE — H&P (Signed)
Anesthesia H&P Update: History and Physical Exam reviewed; patient is OK for planned anesthetic and procedure. ? ?

## 2019-12-19 LAB — COMPREHENSIVE METABOLIC PANEL
ALT: 70 U/L — ABNORMAL HIGH (ref 0–44)
AST: 81 U/L — ABNORMAL HIGH (ref 15–41)
Albumin: 1.4 g/dL — ABNORMAL LOW (ref 3.5–5.0)
Alkaline Phosphatase: 334 U/L — ABNORMAL HIGH (ref 38–126)
Anion gap: 10 (ref 5–15)
BUN: 17 mg/dL (ref 6–20)
CO2: 20 mmol/L — ABNORMAL LOW (ref 22–32)
Calcium: 7.6 mg/dL — ABNORMAL LOW (ref 8.9–10.3)
Chloride: 106 mmol/L (ref 98–111)
Creatinine, Ser: 0.85 mg/dL (ref 0.61–1.24)
GFR, Estimated: >60 mL/min (ref >60)
Glucose, Bld: 130 mg/dL — ABNORMAL HIGH (ref 70–99)
Potassium: 4.3 mmol/L (ref 3.5–5.1)
Sodium: 136 mmol/L (ref 135–145)
Total Bilirubin: 1 mg/dL (ref 0.3–1.2)
Total Protein: 6.1 g/dL — ABNORMAL LOW (ref 6.5–8.1)

## 2019-12-19 LAB — CBC
HCT: 21.1 % — ABNORMAL LOW (ref 39.0–52.0)
Hemoglobin: 6.5 g/dL — CL (ref 13.0–17.0)
MCH: 28.4 pg (ref 26.0–34.0)
MCHC: 30.8 g/dL (ref 30.0–36.0)
MCV: 92.1 fL (ref 80.0–100.0)
Platelets: 366 10*3/uL (ref 150–400)
RBC: 2.29 MIL/uL — ABNORMAL LOW (ref 4.22–5.81)
RDW: 19.8 % — ABNORMAL HIGH (ref 11.5–15.5)
WBC: 22.3 10*3/uL — ABNORMAL HIGH (ref 4.0–10.5)
nRBC: 0 % (ref 0.0–0.2)

## 2019-12-19 LAB — PREPARE RBC (CROSSMATCH)

## 2019-12-19 LAB — HEMOGLOBIN AND HEMATOCRIT, BLOOD
HCT: 23.9 % — ABNORMAL LOW (ref 39.0–52.0)
Hemoglobin: 7.4 g/dL — ABNORMAL LOW (ref 13.0–17.0)

## 2019-12-19 MED ORDER — FE FUMARATE-B12-VIT C-FA-IFC PO CAPS
1.0000 | ORAL_CAPSULE | Freq: Two times a day (BID) | ORAL | Status: DC
  Administered 2019-12-19 – 2020-01-19 (×61): 1 via ORAL
  Filled 2019-12-19 (×70): qty 1

## 2019-12-19 MED ORDER — PROSOURCE PLUS PO LIQD
30.0000 mL | Freq: Two times a day (BID) | ORAL | Status: DC
  Administered 2019-12-19 – 2019-12-25 (×12): 30 mL via ORAL
  Filled 2019-12-19 (×13): qty 30

## 2019-12-19 MED ORDER — SODIUM CHLORIDE 0.9% IV SOLUTION: Freq: Once | INTRAVENOUS | Status: AC

## 2019-12-19 MED ORDER — ADULT MULTIVITAMIN W/MINERALS CH
1.0000 | ORAL_TABLET | Freq: Every day | ORAL | Status: DC
  Administered 2019-12-19 – 2020-01-19 (×31): 1 via ORAL
  Filled 2019-12-19 (×31): qty 1

## 2019-12-19 NOTE — Progress Notes (Signed)
PT Cancellation Note  Patient Details Name: Alex [name] MRN: [id number] DOB: 1986/02/12   Cancelled Treatment:    Reason Eval/Treat Not Completed: Medical issues which prohibited therapy Pt with low Hgb, to get PRBCs.  Will check back as schedule permits.   [name],KATHrine E 12/19/2019, 11:46 AM Paulino Door, DPT Acute Rehabilitation Services Pager: [phone] Office: [phone]

## 2019-12-19 NOTE — Progress Notes (Signed)
[facility] Progress Note  Patient: Alex [name]    IHW:[id number]  DOA: 12/14/2019     Date of Service: the patient was seen and examined on 12/19/2019  Brief hospital course: Past medical history of alcohol abuse, polysubstance abuse with cocaine and meth with occasional use of fentanyl, endocarditis of tricuspid valve, depression, suicidal tendencies, smoker. Recently admitted at [facility], admitted on 12/08/2019, left AMA on 12/09/2019. UDS negative for opiates in June 2021. Patient tells me that he is using occasional fentanyl but not on a regular basis.  His regular recreational drugs are cocaine and methamphetamine. CT chest without contrast Septic emboli. Ultrasound abdomen hepatomegaly Bilateral lower extremity Doppler negative for DVT with bilateral inguinal lymphadenopathy Echocardiogram severe TR large vegetation on posterior leaflet and small vegetation on anterior leaflet. UDS positive for cocaine, opiates and amphetamine. Hep C antibody reactive HIV nonreactive Per cardiothoracic surgery not a candidate for surgical intervention for now. Currently plan is continue IV antibiotics.  Assessment and Plan: 1.    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 Back pain. Shortness of breath. ID consulted. Currently on IV vancomycin And will likely require long-term IV antibiotics. Recently was seen at [facility] with different identity Alex [name]. MRI spine ordered under anesthesia.   Results currently pending. CT surgery Dr. Cliffton [name] consulted.  Felt that the endocarditis appears to be chronic in nature and patient will benefit less from Angiovac.  Currently recommend monitoring. Should the patient have ongoing fever or persistent bacteremia at that point they will consider intervention. Also Patient will benefit from tricuspid valve replacement but given his ongoing IV  drug abuse not a good candidate. Left upper extremity also swollen.  Doppler negative.  X-ray shows soft tissue swelling.  No gas.  Monitor.  Currently no evidence of compartment syndrome.  2.  Anemia.  Iron deficiency. Continue oral iron.  Continue vitamin C.   Hemoglobin dropped significantly to less than 7. We will transfuse 1 PRBC. Repeat hemoglobin stable. Monitor. Currently no indication to discontinue anticoagulation or Toradol.  3.  Polysubstance abuse. UDS positive for amphetamine and opiates. Outside hospital UDS also positive for benzodiazepine as well.  4.  Pain control. Withdrawal symptoms. Difficult to control the symptoms for now. Potentially acute pain secondary to back pain likely from discitis or osteomyelitis from MRSA endocarditis. Therefore currently holding off on the Suboxone therapy for opiates part. Also patient consistently not using fentanyl and he mentions that fentanyl is only part of the other drugs cocaine and crystal meth that he uses consistently. Continue as needed Ativan. Continue scheduled gabapentin. Scheduled Toradol.  5.  Elevated LFT. Currently stable. Monitor for now. Right upper quadrant ultrasound in the outside facility unremarkable.  6.  Hypoalbuminemia. Likely poor nutrition. Monitor for now.  7.  Insomnia and anxiety. Continue as needed Ativan.  Continue scheduled gabapentin.  8.  Mild sinus tachycardia. Monitor for now protection may require introduction of clonidine  9.  Hep C infection. Currently not treated.  Management per ID.  10.  Bilateral inguinal lymphadenopathy. Seen on CT scan as well as clinical examination. Likely reactive.  Currently monitoring.  Diet: Regular diet DVT Prophylaxis:   enoxaparin (LOVENOX) injection 40 mg Start: 12/15/19 2200 Place and maintain sequential compression device Start: 12/14/19 1121    Advance goals of care discussion: Full code  Family Communication: family was  present at bedside, at the time of interview.  The pt provided permission to discuss  medical plan with the family. Opportunity was given to ask question and all questions were answered satisfactorily.   Disposition:  Status is: Inpatient  Remains inpatient appropriate because:Unsafe d/c plan and IV treatments appropriate due to intensity of illness or inability to take PO   Dispo: The patient is from: Home              Anticipated d/c is to: Home              Anticipated d/c date is: > 3 days              Patient currently is not medically stable to d/c.  Subjective: Continues to have back pain.  No nausea no vomiting.  Heating pad does not work for him.  Physical Exam:  General: Appear in mild distress, no Rash; Oral Mucosa Clear, moist. no Abnormal Neck Mass Or lumps, Conjunctiva normal  Cardiovascular: S1 and S2 Present, aortic systolic  Murmur, Respiratory: increased respiratory effort, Bilateral Air entry present and bilateral  Crackles, no wheezes Abdomen: Bowel Sound present, Soft and no tenderness Extremities: no Pedal edema Neurology: alert and oriented to time, place, and person affect appropriate. no new focal deficit Gait not checked due to patient safety concerns  Vitals:   12/19/19 1222 12/19/19 1240 12/19/19 1242 12/19/19 1456  BP: 102/67 98/71 98/71  111/72  Pulse: 94 93 93 92  Resp: 16 15 15 16   Temp: 98.3 F (36.8 C) 97.9 F (36.6 C) 97.9 F (36.6 C) 98.3 F (36.8 C)  TempSrc: Oral  Oral Oral  SpO2: 100%  100% 100%  Weight:      Height:        Intake/Output Summary (Last 24 hours) at 12/19/2019 2041 Last data filed at 12/19/2019 1455 Gross per 24 hour  Intake 965 ml  Output 1175 ml  Net -210 ml   Filed Weights   12/15/19 0500 12/16/19 0429 12/18/19 1133  Weight: 56.7 kg 62.9 kg 62.9 kg    Data Reviewed: I have personally reviewed and interpreted daily labs, tele strips, imagings as discussed above. I reviewed all nursing notes, pharmacy notes,  vitals, pertinent old records I have discussed plan of care as described above with RN and patient/family.  CBC: Recent Labs  Lab 12/14/19 0810 12/14/19 1252 12/16/19 0424 12/16/19 0424 12/16/19 1033 12/16/19 1033 12/16/19 1715 12/17/19 0447 12/18/19 0431 12/19/19 0558 12/19/19 1650  WBC 22.9*   < > 18.3*   < > 19.5*  --  20.0* 18.6* 20.4* 22.3*  --   NEUTROABS 18.7*  --  13.1*  --  14.8*  --   --   --   --   --   --   HGB 7.3*   < > 7.1*   < > 7.4*   < > 7.3* 7.3* 7.2* 6.5* 7.4*  HCT 21.7*   < > 22.5*   < > 24.2*   < > 23.6* 23.4* 23.7* 21.1* 23.9*  MCV 86.5   < > 88.6   < > 90.0  --  90.4 90.7 92.6 92.1  --   PLT 315   < > 374   < > 404*  --  381 394 403* 366  --    < > = values in this interval not displayed.   Basic Metabolic Panel: Recent Labs  Lab 12/15/19 0518 12/16/19 0424 12/17/19 0447 12/18/19 0431 12/19/19 0558  NA 131* 132* 134* 133* 136  K 4.1 3.9 3.9 3.9 4.3  CL 100  102 104 104 106  CO2 21* 23 22 20* 20*  GLUCOSE 86 106* 101* 110* 130*  BUN 16 15 17 14 17   CREATININE 0.89 0.84 0.83 0.80 0.85  CALCIUM 7.3* 7.1* 7.4* 7.5* 7.6*  MG  --  1.8  --   --   --   PHOS  --  3.0  --   --   --     Studies: No results found.  Scheduled Meds: . (feeding supplement) PROSource Plus  30 mL Oral BID BM  . enoxaparin (LOVENOX) injection  40 mg Subcutaneous Q24H  . feeding supplement (ENSURE ENLIVE)  237 mL Oral TID BM  . ferrous fumarate-b12-vitamic C-folic acid  1 capsule Oral BID PC  . gabapentin  300 mg Oral TID  . ketorolac  30 mg Intravenous Q6H  . melatonin  5 mg Oral QHS  . multivitamin with minerals  1 tablet Oral Daily  . sodium chloride flush  3 mL Intravenous Q12H  . traZODone  50 mg Oral QHS   Continuous Infusions: . sodium chloride Stopped (12/17/19 1915)  . lactated ringers 10 mL/hr at 12/18/19 1227  . vancomycin 1,500 mg (12/19/19 1501)   PRN Meds: sodium chloride, acetaminophen, HYDROcodone-acetaminophen, HYDROmorphone (DILAUDID) injection,  levalbuterol, LORazepam, nitroGLYCERIN, ondansetron (ZOFRAN) IV, sodium chloride flush  Time spent: 35 minutes  Author: 02/18/20, MD [facility] 12/19/2019 8:41 PM  To reach On-call, see care teams to locate the attending and reach out via [email or web address]. Between 7PM-7AM, please contact night-coverage If you still have difficulty reaching the attending provider, please page the [facility] (Director on Call) for [facility] on amion for assistance.

## 2019-12-20 ENCOUNTER — Inpatient Hospital Stay (HOSPITAL_COMMUNITY): Payer: Self-pay

## 2019-12-20 LAB — COMPREHENSIVE METABOLIC PANEL
ALT: 65 U/L — ABNORMAL HIGH (ref 0–44)
AST: 61 U/L — ABNORMAL HIGH (ref 15–41)
Albumin: 1.4 g/dL — ABNORMAL LOW (ref 3.5–5.0)
Alkaline Phosphatase: 245 U/L — ABNORMAL HIGH (ref 38–126)
Anion gap: 9 (ref 5–15)
BUN: 18 mg/dL (ref 6–20)
CO2: 21 mmol/L — ABNORMAL LOW (ref 22–32)
Calcium: 7.6 mg/dL — ABNORMAL LOW (ref 8.9–10.3)
Chloride: 106 mmol/L (ref 98–111)
Creatinine, Ser: 0.77 mg/dL (ref 0.61–1.24)
GFR, Estimated: >60 mL/min (ref >60)
Glucose, Bld: 92 mg/dL (ref 70–99)
Potassium: 4.7 mmol/L (ref 3.5–5.1)
Sodium: 136 mmol/L (ref 135–145)
Total Bilirubin: 0.9 mg/dL (ref 0.3–1.2)
Total Protein: 6.2 g/dL — ABNORMAL LOW (ref 6.5–8.1)

## 2019-12-20 LAB — CBC
HCT: 25.6 % — ABNORMAL LOW (ref 39.0–52.0)
Hemoglobin: 7.8 g/dL — ABNORMAL LOW (ref 13.0–17.0)
MCH: 27.8 pg (ref 26.0–34.0)
MCHC: 30.5 g/dL (ref 30.0–36.0)
MCV: 91.1 fL (ref 80.0–100.0)
Platelets: 416 10*3/uL — ABNORMAL HIGH (ref 150–400)
RBC: 2.81 MIL/uL — ABNORMAL LOW (ref 4.22–5.81)
RDW: 19.2 % — ABNORMAL HIGH (ref 11.5–15.5)
WBC: 13.3 10*3/uL — ABNORMAL HIGH (ref 4.0–10.5)
nRBC: 0 % (ref 0.0–0.2)

## 2019-12-20 LAB — BPAM RBC
Blood Product Expiration Date: 202110242359
ISSUE DATE / TIME: 202110091214
Unit Type and Rh: 6200

## 2019-12-20 LAB — TYPE AND SCREEN
ABO/RH(D): A POS
Antibody Screen: NEGATIVE
Unit division: 0

## 2019-12-20 NOTE — Progress Notes (Addendum)
[facility] Progress Note  Patient: Alex [name]    GQQ:[id number]  DOA: 12/14/2019     Date of Service: the patient was seen and examined on 12/20/2019  Brief hospital course: Past medical history of alcohol abuse, polysubstance abuse with cocaine and meth with occasional use of fentanyl, endocarditis of tricuspid valve, depression, suicidal tendencies, smoker. Recently admitted at [facility], admitted on 12/08/2019, left AMA on 12/09/2019. UDS negative for opiates in June 2021. Patient tells me that he is using occasional fentanyl but not on a regular basis.  His regular recreational drugs are cocaine and methamphetamine. CT chest without contrast Septic emboli. Ultrasound abdomen hepatomegaly Bilateral lower extremity Doppler negative for DVT with bilateral inguinal lymphadenopathy Echocardiogram severe TR large vegetation on posterior leaflet and small vegetation on anterior leaflet. UDS positive for cocaine, opiates and amphetamine. Hep C antibody reactive HIV nonreactive Per cardiothoracic surgery not a candidate for surgical intervention for now. Currently plan is continue IV antibiotics.  Assessment and Plan: 1.    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 Back pain. Shortness of breath. ID consulted. Currently on IV vancomycin And will likely require long-term IV antibiotics. Recently was seen at [facility] with different identity Alex [name]. MRI spine ordered under anesthesia.   Results currently pending. CT surgery Dr. Cliffton Asters consulted.  Felt that the endocarditis appears to be chronic in nature and patient will benefit less from Angiovac.  Currently recommend monitoring. Should the patient have ongoing fever or persistent bacteremia at that point they will consider intervention. Also Patient will benefit from tricuspid valve replacement but given his ongoing IV  drug abuse not a good candidate. Left upper extremity also swollen.  Doppler negative.  X-ray shows soft tissue swelling.  No gas.  Monitor.  Currently no evidence of compartment syndrome.  2.  Anemia.  Iron deficiency. Continue oral iron.  Continue vitamin C.   Transfuse for hemoglobin less than 7. SP 1 PRBC on 12/18/2019 Repeat hemoglobin stable. Currently no indication to discontinue anticoagulation or Toradol. Monitor.  3.  Polysubstance abuse. UDS positive for amphetamine and opiates. Outside hospital UDS also positive for benzodiazepine as well.  4.  Pain control. Withdrawal symptoms. Persistent MRSA endocarditis and has acute myositis involving left upper extremity as well as paraspinal musculature. Therefore currently holding off on the Suboxone therapy for opiates. Also patient consistently not using fentanyl and he mentions that fentanyl is only part of the other drugs cocaine and crystal meth that he uses consistently. Continue as needed Ativan. Continue scheduled gabapentin. Scheduled Toradol.  5.  Elevated LFT. Currently stable. Monitor for now. Right upper quadrant ultrasound in the outside facility unremarkable.  6.  Hypoalbuminemia. Likely poor nutrition. Monitor for now.  7.  Insomnia and anxiety. Continue trazodone. Continue as needed Ativan.  Continue scheduled gabapentin.  8.  Mild sinus tachycardia. Monitor for now protection may require introduction of clonidine  9.  Hep C infection. Currently not treated.  Management per ID.  10.  Bilateral inguinal lymphadenopathy. Seen on CT scan as well as clinical examination. Likely reactive.  Currently monitoring.  11.  Hydrocele Reported scrotal swelling. No pain or tenderness. Tells me it has been present for a while. We will check ultrasound Doppler. Likely third spacing. Discussed with urology for treatment option depending on the results of the ultrasound.  Diet: Regular diet DVT  Prophylaxis:   enoxaparin (LOVENOX) injection 40 mg Start: 12/15/19 2200 Place and maintain sequential  compression device Start: 12/14/19 1121  Advance goals of care discussion: Full code  Family Communication: family was present at bedside, at the time of interview.  The pt provided permission to discuss medical plan with the family. Opportunity was given to ask question and all questions were answered satisfactorily.   Disposition:  Status is: Inpatient  Remains inpatient appropriate because:Unsafe d/c plan and IV treatments appropriate due to intensity of illness or inability to take PO   Dispo: The patient is from: Home              Anticipated d/c is to: Home              Anticipated d/c date is: > 3 days              Patient currently is not medically stable to d/c.  Subjective: Continues to have back pain.  No nausea no vomiting.  Heating pad does not work for him.  Physical Exam:  General: Appear in mild distress, no Rash; Oral Mucosa Clear, moist. no Abnormal Neck Mass Or lumps, Conjunctiva normal  Cardiovascular: S1 and S2 Present, aortic systolic Murmur, Respiratory: increased respiratory effort, Bilateral Air entry present and bilateral  Crackles, no wheezes Abdomen: Bowel Sound present, Soft and no tenderness, scrotal swelling bilaterally Extremities: no Pedal edema Neurology: alert and oriented to time, place, and person affect appropriate. no new focal deficit Gait not checked due to patient safety concerns  Vitals:   12/19/19 1242 12/19/19 1456 12/20/19 0459 12/20/19 1227  BP: 98/71 111/72 115/77 115/84  Pulse: 93 92 98 (!) 101  Resp: 15 16 18 20   Temp: 97.9 F (36.6 C) 98.3 F (36.8 C) 98.4 F (36.9 C) 98.3 F (36.8 C)  TempSrc: Oral Oral Oral Oral  SpO2: 100% 100% 97% 98%  Weight:      Height:        Intake/Output Summary (Last 24 hours) at 12/20/2019 1940 Last data filed at 12/20/2019 1839 Gross per 24 hour  Intake 1155.12 ml  Output 1550 ml    Net -394.88 ml   Filed Weights   12/15/19 0500 12/16/19 0429 12/18/19 1133  Weight: 56.7 kg 62.9 kg 62.9 kg    Data Reviewed: I have personally reviewed and interpreted daily labs, tele strips, imagings as discussed above. I reviewed all nursing notes, pharmacy notes, vitals, pertinent old records I have discussed plan of care as described above with RN and patient/family.  CBC: Recent Labs  Lab 12/14/19 0810 12/14/19 1252 12/16/19 0424 12/16/19 0424 12/16/19 1033 12/16/19 1033 12/16/19 1715 12/16/19 1715 12/17/19 0447 12/18/19 0431 12/19/19 0558 12/19/19 1650 12/20/19 0552  WBC 22.9*   < > 18.3*   < > 19.5*   < > 20.0*  --  18.6* 20.4* 22.3*  --  13.3*  NEUTROABS 18.7*  --  13.1*  --  14.8*  --   --   --   --   --   --   --   --   HGB 7.3*   < > 7.1*   < > 7.4*   < > 7.3*   < > 7.3* 7.2* 6.5* 7.4* 7.8*  HCT 21.7*   < > 22.5*   < > 24.2*   < > 23.6*   < > 23.4* 23.7* 21.1* 23.9* 25.6*  MCV 86.5   < > 88.6   < > 90.0   < > 90.4  --  90.7 92.6 92.1  --  91.1  PLT 315   < >  374   < > 404*   < > 381  --  394 403* 366  --  416*   < > = values in this interval not displayed.   Basic Metabolic Panel: Recent Labs  Lab 12/16/19 0424 12/17/19 0447 12/18/19 0431 12/19/19 0558 12/20/19 0552  NA 132* 134* 133* 136 136  K 3.9 3.9 3.9 4.3 4.7  CL 102 104 104 106 106  CO2 23 22 20* 20* 21*  GLUCOSE 106* 101* 110* 130* 92  BUN 15 17 14 17 18   CREATININE 0.84 0.83 0.80 0.85 0.77  CALCIUM 7.1* 7.4* 7.5* 7.6* 7.6*  MG 1.8  --   --   --   --   PHOS 3.0  --   --   --   --     Studies: No results found.  Scheduled Meds: . (feeding supplement) PROSource Plus  30 mL Oral BID BM  . enoxaparin (LOVENOX) injection  40 mg Subcutaneous Q24H  . feeding supplement (ENSURE ENLIVE)  237 mL Oral TID BM  . ferrous fumarate-b12-vitamic C-folic acid  1 capsule Oral BID PC  . gabapentin  300 mg Oral TID  . ketorolac  30 mg Intravenous Q6H  . melatonin  5 mg Oral QHS  . multivitamin with  minerals  1 tablet Oral Daily  . sodium chloride flush  3 mL Intravenous Q12H  . traZODone  50 mg Oral QHS   Continuous Infusions: . sodium chloride Stopped (12/17/19 1915)  . lactated ringers 10 mL/hr at 12/18/19 1227  . vancomycin 1,500 mg (12/20/19 1330)   PRN Meds: sodium chloride, acetaminophen, HYDROcodone-acetaminophen, HYDROmorphone (DILAUDID) injection, levalbuterol, LORazepam, nitroGLYCERIN, ondansetron (ZOFRAN) IV, sodium chloride flush  Time spent: 35 minutes  Author: 02/19/20, MD [facility] 12/20/2019 7:40 PM  To reach On-call, see care teams to locate the attending and reach out via [email or web address]. Between 7PM-7AM, please contact night-coverage If you still have difficulty reaching the attending provider, please page the [facility] (Director on Call) for [facility] on amion for assistance.

## 2019-12-20 NOTE — Progress Notes (Signed)
Pharmacy Antibiotic Note  Alex [name] is a 34 y.o. male admitted on 12/14/2019 with MRSA endocarditis w/ septic emboli (BCx from [facility] where patient recently left AMA, but was admitted to [facility] in Oct 2020 for same). Pharmacy has been consulted for vancomycin dosing.  Day 7 Vanc WBC improved SCr stable Repeat BCx drawn today  Plan:  Continue vancomycin 1500 mg IV q12h   Will check vanc trough tomorrow at 0100 prior to 2am dose - goal trough 15-20 mcg/mL   Monitor clinical course, renal function, cultures as available   Height: 5\' 8"  (172.7 cm) Weight: 62.9 kg (138 lb 11.2 oz) IBW/kg (Calculated) : 68.4  Temp (24hrs), Avg:98.2 F (36.8 C), Min:97.9 F (36.6 C), Max:98.4 F (36.9 C)  Recent Labs  Lab 12/14/19 0810 12/14/19 1252 12/15/19 0518 12/16/19 0424 12/16/19 1033 12/16/19 1715 12/17/19 0447 12/18/19 0431 12/19/19 0558 12/20/19 0552  WBC 22.9*  --    < > 18.3*   < > 20.0* 18.6* 20.4* 22.3* 13.3*  CREATININE 0.97  --    < > 0.84  --   --  0.83 0.80 0.85 0.77  LATICACIDVEN 1.8 2.2*  --   --   --   --   --   --   --   --   VANCOTROUGH  --   --   --   --   --   --  9*  --   --   --    < > = values in this interval not displayed.    Estimated Creatinine Clearance: 115.8 mL/min (by C-G formula based on SCr of 0.77 mg/dL).    No Known Allergies   Thank you for allowing pharmacy to be a part of this patient's care.   02/19/20, PharmD, BCPS 12/20/2019 10:52 AM

## 2019-12-20 NOTE — Progress Notes (Signed)
PT Cancellation Note  Patient Details Name: Alex [name] MRN: [id number] DOB: Mar 20, 1985   Cancelled Treatment:    Reason Eval/Treat Not Completed:  Pt and visitor declined participation with PT on today. Will check back another day.    Faye Ramsay, PT Acute Rehabilitation  Office: [phone] Pager: [phone]

## 2019-12-20 NOTE — Plan of Care (Signed)
  Problem: Education: Goal: Knowledge of General Education information will improve Description: Including pain rating scale, medication(s)/side effects and non-pharmacologic comfort measures Outcome: Progressing   Problem: Health Behavior/Discharge Planning: Goal: Ability to manage health-related needs will improve Outcome: Progressing   Problem: Clinical Measurements: Goal: Ability to maintain clinical measurements within normal limits will improve Outcome: Progressing Goal: Will remain free from infection Outcome: Progressing Goal: Diagnostic test results will improve Outcome: Progressing Goal: Cardiovascular complication will be avoided Outcome: Progressing   Problem: Activity: Goal: Risk for activity intolerance will decrease Outcome: Progressing   Problem: Nutrition: Goal: Adequate nutrition will be maintained Outcome: Progressing   Problem: Coping: Goal: Level of anxiety will decrease Outcome: Progressing   Problem: Pain Managment: Goal: General experience of comfort will improve Outcome: Progressing   Problem: Safety: Goal: Ability to remain free from injury will improve Outcome: Progressing   Problem: Skin Integrity: Goal: Risk for impaired skin integrity will decrease Outcome: Progressing   

## 2019-12-21 ENCOUNTER — Encounter (HOSPITAL_COMMUNITY): Payer: Self-pay | Admitting: Radiology

## 2019-12-21 DIAGNOSIS — M60009 Infective myositis, unspecified site: Secondary | ICD-10-CM

## 2019-12-21 DIAGNOSIS — B182 Chronic viral hepatitis C: Secondary | ICD-10-CM

## 2019-12-21 LAB — COMPREHENSIVE METABOLIC PANEL
ALT: 52 U/L — ABNORMAL HIGH (ref 0–44)
AST: 39 U/L (ref 15–41)
Albumin: 1.5 g/dL — ABNORMAL LOW (ref 3.5–5.0)
Alkaline Phosphatase: 233 U/L — ABNORMAL HIGH (ref 38–126)
Anion gap: 8 (ref 5–15)
BUN: 17 mg/dL (ref 6–20)
CO2: 19 mmol/L — ABNORMAL LOW (ref 22–32)
Calcium: 7.7 mg/dL — ABNORMAL LOW (ref 8.9–10.3)
Chloride: 107 mmol/L (ref 98–111)
Creatinine, Ser: 0.73 mg/dL (ref 0.61–1.24)
GFR, Estimated: >60 mL/min (ref >60)
Glucose, Bld: 104 mg/dL — ABNORMAL HIGH (ref 70–99)
Potassium: 5 mmol/L (ref 3.5–5.1)
Sodium: 134 mmol/L — ABNORMAL LOW (ref 135–145)
Total Bilirubin: 0.7 mg/dL (ref 0.3–1.2)
Total Protein: 6.5 g/dL (ref 6.5–8.1)

## 2019-12-21 LAB — CBC
HCT: 25.8 % — ABNORMAL LOW (ref 39.0–52.0)
Hemoglobin: 7.8 g/dL — ABNORMAL LOW (ref 13.0–17.0)
MCH: 27.9 pg (ref 26.0–34.0)
MCHC: 30.2 g/dL (ref 30.0–36.0)
MCV: 92.1 fL (ref 80.0–100.0)
Platelets: 417 10*3/uL — ABNORMAL HIGH (ref 150–400)
RBC: 2.8 MIL/uL — ABNORMAL LOW (ref 4.22–5.81)
RDW: 19.1 % — ABNORMAL HIGH (ref 11.5–15.5)
WBC: 13.5 10*3/uL — ABNORMAL HIGH (ref 4.0–10.5)
nRBC: 0 % (ref 0.0–0.2)

## 2019-12-21 LAB — CULTURE, BLOOD (ROUTINE X 2)
Culture: NO GROWTH
Special Requests: ADEQUATE

## 2019-12-21 LAB — VANCOMYCIN, TROUGH: Vancomycin Tr: 21 ug/mL (ref 15–20)

## 2019-12-21 LAB — MAGNESIUM: Magnesium: 1.9 mg/dL (ref 1.7–2.4)

## 2019-12-21 MED ORDER — FAMOTIDINE 20 MG PO TABS
20.0000 mg | ORAL_TABLET | Freq: Every day | ORAL | Status: DC
  Administered 2019-12-21 – 2020-01-19 (×29): 20 mg via ORAL
  Filled 2019-12-21 (×29): qty 1

## 2019-12-21 MED ORDER — FUROSEMIDE 10 MG/ML IJ SOLN
40.0000 mg | Freq: Two times a day (BID) | INTRAMUSCULAR | Status: AC
  Administered 2019-12-21 (×2): 40 mg via INTRAVENOUS
  Filled 2019-12-21 (×2): qty 4

## 2019-12-21 MED ORDER — VANCOMYCIN HCL IN DEXTROSE 1-5 GM/200ML-% IV SOLN
1000.0000 mg | Freq: Two times a day (BID) | INTRAVENOUS | Status: DC
  Administered 2019-12-21 – 2019-12-25 (×8): 1000 mg via INTRAVENOUS
  Filled 2019-12-21 (×7): qty 200

## 2019-12-21 MED ORDER — DIPHENHYDRAMINE HCL 25 MG PO CAPS
25.0000 mg | ORAL_CAPSULE | Freq: Four times a day (QID) | ORAL | Status: DC | PRN
  Administered 2019-12-25: 25 mg via ORAL
  Filled 2019-12-21 (×2): qty 1

## 2019-12-21 MED ORDER — NAPROXEN 500 MG PO TABS
500.0000 mg | ORAL_TABLET | Freq: Three times a day (TID) | ORAL | Status: DC
  Administered 2019-12-21 – 2020-01-19 (×82): 500 mg via ORAL
  Filled 2019-12-21 (×80): qty 1

## 2019-12-21 MED ORDER — ALBUMIN HUMAN 25 % IV SOLN
12.5000 g | Freq: Once | INTRAVENOUS | Status: AC
  Administered 2019-12-21: 12.5 g via INTRAVENOUS
  Filled 2019-12-21: qty 50

## 2019-12-21 NOTE — Progress Notes (Signed)
CRITICAL VALUE ALERT  Critical Value: Vancomycin =21   Date & Time Notied:  12/21/19 1188  Provider Notified: Loralee Pacas, PharmD   Orders Received/Actions taken:  Pharmacy to adjust med orders

## 2019-12-21 NOTE — Progress Notes (Signed)
[facility] Progress Note  Patient: Alex [name]    NTI:[id number]  DOA: 12/14/2019     Date of Service: the patient was seen and examined on 12/21/2019  Brief hospital course: Past medical history of alcohol abuse, polysubstance abuse with cocaine and meth with occasional use of fentanyl, endocarditis of tricuspid valve, depression, suicidal tendencies, smoker. Recently admitted at [facility], admitted on 12/08/2019, left AMA on 12/09/2019. UDS negative for opiates in June 2021. Patient tells me that he is using occasional fentanyl but not on a regular basis.  His regular recreational drugs are cocaine and methamphetamine. CT chest without contrast Septic emboli. Ultrasound abdomen hepatomegaly Bilateral lower extremity Doppler negative for DVT with bilateral inguinal lymphadenopathy Echocardiogram severe TR large vegetation on posterior leaflet and small vegetation on anterior leaflet. UDS positive for cocaine, opiates and amphetamine. Hep C antibody reactive HIV nonreactive Per cardiothoracic surgery not a candidate for surgical intervention for now. Currently plan is continue IV antibiotics.  Assessment and Plan: 1.    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 ID consulted. Currently on IV vancomycin And will likely require long-term IV antibiotics. Recently was seen at [facility] with different identity Alex [name]. MRI spine ordered under anesthesia.   Results currently pending. CT surgery Dr. Cliffton Asters consulted.  Felt that the endocarditis appears to be chronic in nature and patient will benefit less from Angiovac.  Currently recommend monitoring. Should the patient have ongoing fever or persistent bacteremia at that point they will consider intervention. Also Patient will benefit from tricuspid valve replacement but given his ongoing IV drug abuse not a good  candidate. Left upper extremity also swollen.  Doppler negative.  X-ray shows soft tissue swelling.  No gas.  Monitor.  Currently no evidence of compartment syndrome. Blood cultures done on 12/20/2019 so far no growth.  2.  Nutritional deficiency Anemia.  Iron deficiency.  No active bleeding Continue oral iron.  Continue vitamin C.   Transfuse for hemoglobin less than 7. SP 1 PRBC on 12/18/2019 Repeat hemoglobin stable. Currently no indication to discontinue anticoagulation or Toradol. Monitor.  3.  Polysubstance abuse. UDS positive for amphetamine and opiates. Outside hospital UDS also positive for benzodiazepine as well.  4.  Pain control. Withdrawal symptoms. Persistent MRSA endocarditis and has acute myositis involving left upper extremity as well as paraspinal musculature. Therefore currently holding off on the Suboxone therapy for opiates. Also patient consistently not using fentanyl and he mentions that fentanyl is only part of the other drugs cocaine and crystal meth that he uses consistently. Continue as needed Ativan. Continue scheduled gabapentin. Scheduled Toradol.  5.  Elevated LFT. Currently improving. Monitor for now. Right upper quadrant ultrasound in the outside facility unremarkable.  6.  Hypoalbuminemia. Likely poor nutrition. Monitor for now.  7.  Insomnia and anxiety. Continue trazodone. Continue as needed Ativan.  Continue scheduled gabapentin.  8.  Mild sinus tachycardia. Monitor for now protection may require introduction of clonidine  9.  Hep C infection. Currently not treated.  Management per ID.  10.  Bilateral inguinal lymphadenopathy. Seen on CT scan as well as clinical examination. Likely reactive.  Currently monitoring.  11.  Hydrocele Reported scrotal swelling. No pain or tenderness. Tells me it has been present for a while. Ultrasound Doppler negative for any acute abnormality.  Currently no evidence of skin infection. Likely  third spacing. Discussed with urology recommend conservative measures and scrotal support.  12.  Acute on  chronic HFpEF, primarily RV failure. Severe tricuspid regurgitation. Hypoalbuminemia with third spacing.  Received IV albumin followed by IV Lasix. -6 L for last today Monitor renal function and potassium.  13.  Vancomycin induced rash. Patient reported some IV site rash today secondary to vancomycin suspecting. Initiate Benadryl as needed. Continue vancomycin. Reduce the rate of the infusion.  Diet: Regular diet DVT Prophylaxis:   enoxaparin (LOVENOX) injection 40 mg Start: 12/15/19 2200 Place and maintain sequential compression device Start: 12/14/19 1121  Advance goals of care discussion: Full code  Family Communication: family was present at bedside, at the time of interview.  The pt provided permission to discuss medical plan with the family. Opportunity was given to ask question and all questions were answered satisfactorily.   Disposition:  Status is: Inpatient  Remains inpatient appropriate because:Unsafe d/c plan and IV treatments appropriate due to intensity of illness or inability to take PO   Dispo: The patient is from: Home              Anticipated d/c is to: Home              Anticipated d/c date is: > 3 days              Patient currently is not medically stable to d/c.  Subjective: Continues to have back pain.  No nausea no vomiting.  Concern about scrotal swelling.  No fever no chills.  Physical Exam:  General: Appear in moderate distress, IV site morbilliform rash; Oral Mucosa Clear, moist. no Abnormal Neck Mass Or lumps, Conjunctiva normal  Cardiovascular: S1 and S2 Present, aortic systolic  Murmur, Respiratory: increased respiratory effort, Bilateral Air entry present and bilateral Crackles, no wheezes Abdomen: Bowel Sound present, Soft and no tenderness, scrotal swelling Extremities: trace Pedal edema Neurology: alert and oriented to time,  place, and person affect appropriate. no new focal deficit Gait not checked due to patient safety concerns   Vitals:   12/21/19 0541 12/21/19 1119 12/21/19 1220 12/21/19 1337  BP: 123/89 121/84 120/77 116/80  Pulse: 97 (!) 102 100 (!) 103  Resp: 18 17 (!) 22 19  Temp: 98.1 F (36.7 C) 98.5 F (36.9 C) 98.4 F (36.9 C) 98.4 F (36.9 C)  TempSrc: Oral Oral Oral   SpO2: 99% 98% 100% 99%  Weight:      Height:        Intake/Output Summary (Last 24 hours) at 12/21/2019 1939 Last data filed at 12/21/2019 1900 Gross per 24 hour  Intake 869.78 ml  Output 7400 ml  Net -6530.22 ml   Filed Weights   12/16/19 0429 12/18/19 1133 12/21/19 0500  Weight: 62.9 kg 62.9 kg 71.1 kg    Data Reviewed: I have personally reviewed and interpreted daily labs, tele strips, imagings as discussed above. I reviewed all nursing notes, pharmacy notes, vitals, pertinent old records I have discussed plan of care as described above with RN and patient/family.  CBC: Recent Labs  Lab 12/16/19 0424 12/16/19 0424 12/16/19 1033 12/16/19 1715 12/17/19 0447 12/17/19 0447 12/18/19 0431 12/19/19 0558 12/19/19 1650 12/20/19 0552 12/21/19 0109  WBC 18.3*   < > 19.5*   < > 18.6*  --  20.4* 22.3*  --  13.3* 13.5*  NEUTROABS 13.1*  --  14.8*  --   --   --   --   --   --   --   --   HGB 7.1*   < > 7.4*   < > 7.3*   < >  7.2* 6.5* 7.4* 7.8* 7.8*  HCT 22.5*   < > 24.2*   < > 23.4*   < > 23.7* 21.1* 23.9* 25.6* 25.8*  MCV 88.6   < > 90.0   < > 90.7  --  92.6 92.1  --  91.1 92.1  PLT 374   < > 404*   < > 394  --  403* 366  --  416* 417*   < > = values in this interval not displayed.   Basic Metabolic Panel: Recent Labs  Lab 12/16/19 0424 12/16/19 0424 12/17/19 0447 12/18/19 0431 12/19/19 0558 12/20/19 0552 12/21/19 0109  NA 132*   < > 134* 133* 136 136 134*  K 3.9   < > 3.9 3.9 4.3 4.7 5.0  CL 102   < > 104 104 106 106 107  CO2 23   < > 22 20* 20* 21* 19*  GLUCOSE 106*   < > 101* 110* 130* 92 104*   BUN 15   < > 17 14 17 18 17   CREATININE 0.84   < > 0.83 0.80 0.85 0.77 0.73  CALCIUM 7.1*   < > 7.4* 7.5* 7.6* 7.6* 7.7*  MG 1.8  --   --   --   --   --  1.9  PHOS 3.0  --   --   --   --   --   --    < > = values in this interval not displayed.    Studies: No results found.  Scheduled Meds: . (feeding supplement) PROSource Plus  30 mL Oral BID BM  . enoxaparin (LOVENOX) injection  40 mg Subcutaneous Q24H  . famotidine  20 mg Oral Daily  . feeding supplement (ENSURE ENLIVE)  237 mL Oral TID BM  . ferrous fumarate-b12-vitamic C-folic acid  1 capsule Oral BID PC  . gabapentin  300 mg Oral TID  . melatonin  5 mg Oral QHS  . multivitamin with minerals  1 tablet Oral Daily  . naproxen  500 mg Oral TID WC  . sodium chloride flush  3 mL Intravenous Q12H  . traZODone  50 mg Oral QHS   Continuous Infusions: . sodium chloride Stopped (12/17/19 1915)  . lactated ringers 10 mL/hr at 12/18/19 1227  . vancomycin 1,000 mg (12/21/19 1716)   PRN Meds: sodium chloride, acetaminophen, HYDROcodone-acetaminophen, HYDROmorphone (DILAUDID) injection, levalbuterol, LORazepam, nitroGLYCERIN, ondansetron (ZOFRAN) IV, sodium chloride flush  Time spent: 35 minutes  Author: 02/20/20, MD [facility] 12/21/2019 7:39 PM  To reach On-call, see care teams to locate the attending and reach out via [email or web address]. Between 7PM-7AM, please contact night-coverage If you still have difficulty reaching the attending provider, please page the [facility] (Director on Call) for [facility] on amion for assistance.

## 2019-12-21 NOTE — Progress Notes (Signed)
PT Cancellation Note  Patient Details Name: TEJAS SEAWOOD MRN: [id number] DOB: 03-30-1985   Cancelled Treatment:    Reason Eval/Treat Not Completed:  Attempted PT eval-pt declined to participate at this time. He requested I check back in 76min-1 hour. Will check back as schedule allows.    Faye Ramsay, PT Acute Rehabilitation  Office: [phone] Pager: [phone]

## 2019-12-21 NOTE — Evaluation (Signed)
Physical Therapy Evaluation Patient Details Name: Alex [name] MRN: [id number] DOB: 1985/09/03 Today's Date: 12/21/2019   History of Present Illness  34 yo male admitted with endocarditis, back pain. Imaging (+) paraspinal myositis. Hx of asthma, polysubstance abuse, ADHD, asthma  Clinical Impression  Mod encouragement for pt participation. On eval, pt was Min guard assist for mobility. He walked ~150 feet while pushing the IV pole. Dyspnea 2/4. O2 >90% on RA. Encouraged pt to mobilize more. Will plan to follow pt during hospital stay.     Follow Up Recommendations No PT follow up    Equipment Recommendations  None recommended by PT    Recommendations for Other Services       Precautions / Restrictions Precautions Precautions: Fall Restrictions Weight Bearing Restrictions: No      Mobility  Bed Mobility Overal bed mobility: Modified Independent                Transfers Overall transfer level: Modified independent                  Ambulation/Gait Ambulation/Gait assistance: Min guard Gait Distance (Feet): 150 Feet Assistive device: IV Pole Gait Pattern/deviations: Step-through pattern;Decreased stride length     General Gait Details: Unsteady. Used IV pole for 1 hand support. Dyspnea 2/4. O2 >90% on RA. Wide BOS 2* scrotal edema.  Stairs            Wheelchair Mobility    Modified Rankin (Stroke Patients Only)       Balance Overall balance assessment: Needs assistance           Standing balance-Leahy Scale: Fair                               Pertinent Vitals/Pain Pain Assessment: 0-10 Pain Score: 5  Pain Location: back, LEs, abd Pain Descriptors / Indicators: Discomfort;Sore Pain Intervention(s): Limited activity within patient's tolerance;Monitored during session;Repositioned    Home Living Family/patient expects to be discharged to:: Unsure     Type of Home: Homeless (per pt report during eval)                 Prior Function Level of Independence: Independent               Hand Dominance        Extremity/Trunk Assessment   Upper Extremity Assessment Upper Extremity Assessment: Overall WFL for tasks assessed    Lower Extremity Assessment Lower Extremity Assessment: Generalized weakness    Cervical / Trunk Assessment Cervical / Trunk Assessment: Normal  Communication   Communication: No difficulties  Cognition Arousal/Alertness: Awake/alert Behavior During Therapy: WFL for tasks assessed/performed Overall Cognitive Status: Within Functional Limits for tasks assessed                                        General Comments      Exercises     Assessment/Plan    PT Assessment Patient needs continued PT services  PT Problem List Decreased mobility;Decreased activity tolerance;Pain       PT Treatment Interventions Gait training;Therapeutic activities;Therapeutic exercise;Patient/family education;Functional mobility training    PT Goals (Current goals can be found in the Care Plan section)  Acute Rehab PT Goals Patient Stated Goal: to feel better PT Goal Formulation: With patient Time For Goal Achievement: 01/04/20 Potential to Achieve Goals: Good  Frequency Min 2X/week   Barriers to discharge        Co-evaluation               AM-PAC PT "6 Clicks" Mobility  Outcome Measure Help needed turning from your back to your side while in a flat bed without using bedrails?: None Help needed moving from lying on your back to sitting on the side of a flat bed without using bedrails?: None Help needed moving to and from a bed to a chair (including a wheelchair)?: None Help needed standing up from a chair using your arms (e.g., wheelchair or bedside chair)?: None Help needed to walk in hospital room?: A Little Help needed climbing 3-5 steps with a railing? : A Little 6 Click Score: 22    End of Session   Activity Tolerance: Patient  tolerated treatment well Patient left: in bed;with call bell/phone within reach;with bed alarm set   PT Visit Diagnosis: Unsteadiness on feet (R26.81)    Time: 1610-9604 PT Time Calculation (min) (ACUTE ONLY): 30 min   Charges:   PT Evaluation $PT Eval Low Complexity: 1 Low PT Treatments $Gait Training: 8-22 mins           Faye Ramsay, PT Acute Rehabilitation  Office: [phone] Pager: [phone]

## 2019-12-21 NOTE — Plan of Care (Signed)
  Problem: Activity: Goal: Risk for activity intolerance will decrease Outcome: Progressing   Problem: Nutrition: Goal: Adequate nutrition will be maintained Outcome: Progressing   Problem: Coping: Goal: Level of anxiety will decrease Outcome: Progressing   Problem: Education: Goal: Knowledge of General Education information will improve Description: Including pain rating scale, medication(s)/side effects and non-pharmacologic comfort measures Outcome: Completed/Met   

## 2019-12-21 NOTE — Progress Notes (Signed)
Pharmacy Antibiotic Note  Alex [name] is a 34 y.o. male admitted on 12/14/2019 with MRSA endocarditis w/ septic emboli (BCx from [facility] where patient recently left AMA, but was admitted to [facility] in Oct 2020 for same). Pharmacy has been consulted for vancomycin dosing.  Day #8 Vanc WBC improved SCr stable Trough 21 mcg/ml > goal  Plan:  Decrease vancomycin to 1g IV q12h   Plan to recheck trough at new steady state - goal trough 15-20 mcg/mL   Monitor clinical course, renal function, cultures as available   Height: 5\' 8"  (172.7 cm) Weight: 71.1 kg (156 lb 12.8 oz) (bedscale) IBW/kg (Calculated) : 68.4  Temp (24hrs), Avg:98.2 F (36.8 C), Min:98.1 F (36.7 C), Max:98.3 F (36.8 C)  Recent Labs  Lab 12/14/19 1252 12/15/19 0518 12/17/19 0447 12/18/19 0431 12/19/19 0558 12/20/19 0552 12/21/19 0109  WBC  --    < > 18.6* 20.4* 22.3* 13.3* 13.5*  CREATININE  --    < > 0.83 0.80 0.85 0.77 0.73  LATICACIDVEN 2.2*  --   --   --   --   --   --   VANCOTROUGH  --   --  9*  --   --   --  21*   < > = values in this interval not displayed.    Estimated Creatinine Clearance: 125.9 mL/min (by C-G formula based on SCr of 0.73 mg/dL).    No Known Allergies  10/4 vancomycin >>   Dose adjustments/levels this admission:  10/7 0500 = 9  on vanc 750mg  q12>> inc to 1500 mg IV q12h  10/11 0100 VT = 21 on vanc 1500mg  q12, decr 1g q12h  Microbiology results: BCx pending from [facility] hospital attempting to get records 10/4 BCx: 3 of 4 MRSA 10/6 BCx: staph aureus in 1 set - MRSA 10/10 repeat BCx: sent  Thank you for allowing pharmacy to be a part of this patient's care.  [facility], PharmD, BCPS Pharmacy: [phone] 12/21/2019 10:05 AM

## 2019-12-21 NOTE — Progress Notes (Signed)
[facility]    Date of Admission:  12/14/2019   Total days of antibiotics 4           Abx: 10/04-c vancomycin   A/p: 34 y.o. male ivdu with hx mrsa TV endocarditis dx'ed 12/2018 (tx with vanc --> oritavancin and transitioned to 6 weeks bactrim due to concern of noncompliance), admitted 10/4 in transfer from [facility] for mrsa bacteremia and echo finding tv vegetation, found also to have comoplication of cavitary pulm lesions and lumbar paraspinous myositis  10/04 tte large complex tv mobile vegetation.  10/04 and 10/06 bcx positive; 10/10 first negative but pending final report 10/08 last documented fever  CT surgery evaluated not candidate for AngioVac  Clinically slow improvement but significant burden of disease still Given paraspinous process duration tx at least 8 weeks  Other issues: Chronic hep c Hep b serology negative;  Last hiv screen 12/2018 negative   -repeat hiv serology and rpr serology -hep c tx can be done outpatient on ID clinic f/u -will benefit from hep b vaccination as well  -continue vancomycin per pharmacy -f/u repeat bcx -will need repeat mri scan lumbar area in 3-4 weeks -monitor for evoluation of periarticular knee pain; doesn't appear to be septic arthritis or abscesses at this point   Principal Problem:   Endocarditis Active Problems:   Polysubstance dependence (HCC)   IVDU (intravenous drug user)   Acute CHF (congestive heart failure) (HCC)   Anemia   Acute septic pulmonary embolism (HCC)   Goals of care, counseling/discussion   Palliative care encounter    Subjective: Fever resolved 10/08 Improve pain but significant still in lower back and bilateral LE around knees No rash No sob, chest pain, cough No headache or visual changes  Medications:  . (feeding supplement) PROSource Plus  30 mL Oral BID BM  . enoxaparin (LOVENOX) injection  40 mg Subcutaneous Q24H  . famotidine  20 mg Oral Daily  . feeding  supplement (ENSURE ENLIVE)  237 mL Oral TID BM  . ferrous fumarate-b12-vitamic C-folic acid  1 capsule Oral BID PC  . furosemide  40 mg Intravenous BID  . gabapentin  300 mg Oral TID  . melatonin  5 mg Oral QHS  . multivitamin with minerals  1 tablet Oral Daily  . naproxen  500 mg Oral TID WC  . sodium chloride flush  3 mL Intravenous Q12H  . traZODone  50 mg Oral QHS    Objective: Vital signs in last 24 hours: Temp:  [98.1 F (36.7 C)-98.5 F (36.9 C)] 98.4 F (36.9 C) (10/11 1220) Pulse Rate:  [97-102] 100 (10/11 1220) Resp:  [17-22] 22 (10/11 1220) BP: (120-123)/(77-89) 120/77 (10/11 1220) SpO2:  [98 %-100 %] 100 % (10/11 1220) Weight:  [71.1 kg] 71.1 kg (10/11 0500)  Physical Exam  Constitutional: mild distress; conversant; cooperative HEENT: Oropharynx is clear; per; conj clear; eomi CV: RRR, systolic murmur left sternal border Neck = elevated JVP to angle of jaw Pulmonary; normal respiratory effort; clear lungs Abdominal: Soft, nt Neurological: nonfocal Skin: no rash Psychiatric: alert/oriented   Lab Results Recent Labs    12/20/19 0552 12/21/19 0109  WBC 13.3* 13.5*  HGB 7.8* 7.8*  HCT 25.6* 25.8*  NA 136 134*  K 4.7 5.0  CL 106 107  CO2 21* 19*  BUN 18 17  CREATININE 0.77 0.73   Liver Panel Recent Labs    12/20/19 0552 12/21/19 0109  PROT 6.2* 6.5  ALBUMIN 1.4* 1.5*  AST 61* 39  ALT 65* 52*  ALKPHOS 245* 233*  BILITOT 0.9 0.7   Sedimentation Rate No results for input(s): ESRSEDRATE in the last 72 hours. C-Reactive Protein No results for input(s): CRP in the last 72 hours.  Microbiology: 10/6 blood cx + 10/4 blood cx + Studies/Results: US SCROTUM W/DOPPLER  Result Date: 12/20/2019 CLINICAL DATA:  Hydrocele. EXAM: SCROTAL ULTRASOUND DOPPLER ULTRASOUND OF THE TESTICLES TECHNIQUE: Complete ultrasound examination of the testicles, epididymis, and other scrotal structures was performed. Color and spectral Doppler ultrasound were also  utilized to evaluate blood flow to the testicles. COMPARISON:  None. FINDINGS: Right testicle Measurements: 4.1 x 2.8 x 2.7 cm. No mass or microlithiasis visualized. Left testicle Measurements: 3.6 x 2.6 x 2.5 cm. No mass or microlithiasis visualized. Right epididymis:  Normal in size and appearance. Left epididymis:  Normal in size and appearance. Hydrocele:  None visualized. Varicocele:  None visualized. Pulsed Doppler interrogation of both testes demonstrates normal low resistance arterial and venous waveforms bilaterally. There is scrotal wall thickening with increased vascularity. IMPRESSION: 1. No evidence for testicular mass.  No evidence for portion. 2. Nonspecific diffuse scrotal wall thickening and hypervascularity. This can be seen in patients with a soft tissue infection. Correlation with physical exam is recommended. Electronically Signed   By: Katherine Mantle M.D.   On: 12/20/2019 20:49      [facility]ita [facility] Cell: [phone] Pager: [phone]  12/21/2019, 1:31 PM

## 2019-12-21 NOTE — Progress Notes (Signed)
Palliative care brief note  Discussed with primary attending.  At this point, goals are clear for continuation of aggressive interventions.  PMT to sign off.  Please call or reconsult if we can be of further assistance in the care of Alex [name] moving forward.  Romie Minus, MD [facility] Palliative Medicine Team [phone]  NO CHARGE NOTE

## 2019-12-21 NOTE — Plan of Care (Signed)
  Problem: Education: Goal: Knowledge of General Education information will improve Description: Including pain rating scale, medication(s)/side effects and non-pharmacologic comfort measures Outcome: Progressing   Problem: Health Behavior/Discharge Planning: Goal: Ability to manage health-related needs will improve Outcome: Progressing   Problem: Clinical Measurements: Goal: Ability to maintain clinical measurements within normal limits will improve Outcome: Progressing Goal: Will remain free from infection Outcome: Progressing Goal: Diagnostic test results will improve Outcome: Progressing Goal: Cardiovascular complication will be avoided Outcome: Progressing   Problem: Activity: Goal: Risk for activity intolerance will decrease Outcome: Progressing   Problem: Nutrition: Goal: Adequate nutrition will be maintained Outcome: Progressing   Problem: Coping: Goal: Level of anxiety will decrease Outcome: Progressing   Problem: Pain Managment: Goal: General experience of comfort will improve Outcome: Progressing   Problem: Safety: Goal: Ability to remain free from injury will improve Outcome: Progressing   Problem: Skin Integrity: Goal: Risk for impaired skin integrity will decrease Outcome: Progressing   

## 2019-12-22 ENCOUNTER — Inpatient Hospital Stay (HOSPITAL_COMMUNITY): Payer: Self-pay

## 2019-12-22 DIAGNOSIS — J90 Pleural effusion, not elsewhere classified: Secondary | ICD-10-CM

## 2019-12-22 LAB — COMPREHENSIVE METABOLIC PANEL
ALT: 41 U/L (ref 0–44)
AST: 23 U/L (ref 15–41)
Albumin: 1.8 g/dL — ABNORMAL LOW (ref 3.5–5.0)
Alkaline Phosphatase: 222 U/L — ABNORMAL HIGH (ref 38–126)
Anion gap: 10 (ref 5–15)
BUN: 18 mg/dL (ref 6–20)
CO2: 23 mmol/L (ref 22–32)
Calcium: 8.2 mg/dL — ABNORMAL LOW (ref 8.9–10.3)
Chloride: 103 mmol/L (ref 98–111)
Creatinine, Ser: 0.79 mg/dL (ref 0.61–1.24)
GFR, Estimated: >60 mL/min (ref >60)
Glucose, Bld: 101 mg/dL — ABNORMAL HIGH (ref 70–99)
Potassium: 4.5 mmol/L (ref 3.5–5.1)
Sodium: 136 mmol/L (ref 135–145)
Total Bilirubin: 0.7 mg/dL (ref 0.3–1.2)
Total Protein: 7 g/dL (ref 6.5–8.1)

## 2019-12-22 LAB — CBC
HCT: 25.7 % — ABNORMAL LOW (ref 39.0–52.0)
Hemoglobin: 7.8 g/dL — ABNORMAL LOW (ref 13.0–17.0)
MCH: 27.5 pg (ref 26.0–34.0)
MCHC: 30.4 g/dL (ref 30.0–36.0)
MCV: 90.5 fL (ref 80.0–100.0)
Platelets: 448 10*3/uL — ABNORMAL HIGH (ref 150–400)
RBC: 2.84 MIL/uL — ABNORMAL LOW (ref 4.22–5.81)
RDW: 19 % — ABNORMAL HIGH (ref 11.5–15.5)
WBC: 14.9 10*3/uL — ABNORMAL HIGH (ref 4.0–10.5)
nRBC: 0 % (ref 0.0–0.2)

## 2019-12-22 LAB — RPR: RPR Ser Ql: NONREACTIVE

## 2019-12-22 LAB — MAGNESIUM: Magnesium: 1.9 mg/dL (ref 1.7–2.4)

## 2019-12-22 LAB — HIV ANTIBODY (ROUTINE TESTING W REFLEX): HIV Screen 4th Generation wRfx: NONREACTIVE

## 2019-12-22 MED ORDER — FUROSEMIDE 10 MG/ML IJ SOLN
40.0000 mg | Freq: Two times a day (BID) | INTRAMUSCULAR | Status: AC
  Administered 2019-12-22 (×2): 40 mg via INTRAVENOUS
  Filled 2019-12-22 (×2): qty 4

## 2019-12-22 MED ORDER — SPIRONOLACTONE 12.5 MG HALF TABLET
12.5000 mg | ORAL_TABLET | Freq: Every day | ORAL | Status: DC
  Administered 2019-12-22 – 2020-01-19 (×28): 12.5 mg via ORAL
  Filled 2019-12-22 (×29): qty 1

## 2019-12-22 MED ORDER — HYDROMORPHONE HCL 1 MG/ML IJ SOLN
0.5000 mg | Freq: Four times a day (QID) | INTRAMUSCULAR | Status: DC | PRN
  Administered 2019-12-22 – 2019-12-24 (×6): 0.5 mg via INTRAVENOUS
  Filled 2019-12-22 (×6): qty 0.5

## 2019-12-22 MED ORDER — SPIRONOLACTONE 25 MG PO TABS: 25.0000 mg | ORAL_TABLET | Freq: Every day | ORAL | Status: DC

## 2019-12-22 NOTE — Progress Notes (Signed)
[facility] Progress Note  Patient: Alex [name]    QMV:[id number]  DOA: 12/14/2019     Date of Service: the patient was seen and examined on 12/22/2019  Brief hospital course: Past medical history of alcohol abuse, polysubstance abuse with cocaine and meth with occasional use of fentanyl, endocarditis of tricuspid valve, depression, suicidal tendencies, smoker. Recently admitted at [facility], admitted on 12/08/2019, left AMA on 12/09/2019. Monitor her progress.  Valve endocarditis as well as hep C with septic emboli to chest and acute paraspinal muscle myositis as well as left upper extremity myositis. Per cardiothoracic surgery not a candidate for angiovac/surgical intervention for now. Currently plan is continue IV antibiotics.  Assessment and Plan: 1.    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 ID consulted. Currently on IV vancomycin And will likely require long-term IV antibiotics. Recently at [facility] under different identity Fransico MeadowCameron Clark. MRI thoracic and lumbar spine under anesthesia negative for osteomyelitis or discitis or septic arthritis within the lumbar and thoracic spine or epidural abscess, intraspinal infection.  Positive for paraspinous musculature myositis.  No lobulated soft tissue or abscess. CT chest abdomen pelvis and outside facility shows pulmonary septic emboli but no other evidence of embolization. CT chest without contrast here bilateral pulmonary nodules with central cavitation concerning for septic emboli with reactive appearing mediastinal lymph nodes and small bilateral pleural effusion. Blood cultures positive on admission.  Repeat blood cultures on 12/20/2019 so far no growth for 48 hours.  CT surgery Dr. Cliffton AstersLightfoot consulted.  Felt that the endocarditis appears to be chronic in nature and patient will benefit less from Angiovac.  Currently  recommend monitoring. Should the patient have ongoing fever or persistent bacteremia at that point they will consider intervention. Also Patient will benefit from tricuspid valve replacement but given his ongoing IV drug abuse not a good candidate.  Left upper extremity also swollen.  Doppler negative.  X-ray shows soft tissue swelling.  No gas.  Monitor.  Currently no evidence of compartment syndrome.  Plan from ID, continue IV antibiotics for 6 to 8 weeks, repeat MRI lumbar spine in 3 to 4 weeks.  Will need long-term suppression antibiotics since not a candidate for surgery.  2.  Nutritional deficiency Anemia.  Iron deficiency.  No active bleeding Continue oral iron.  Continue vitamin C.   Transfuse for hemoglobin less than 7. SP 1 PRBC on 12/18/2019 Repeat hemoglobin stable. Currently no indication to discontinue anticoagulation or Toradol. Monitor.  3.  Polysubstance abuse. UDS positive for amphetamine and opiates. Outside hospital UDS also positive for benzodiazepine as well.  4.  Pain control. Withdrawal symptoms. Persistent MRSA endocarditis and has acute myositis involving left upper extremity as well as paraspinal musculature. Therefore currently holding off on the Suboxone therapy for opiates. Also patient consistently not using fentanyl and he mentions that fentanyl is only part of the other drugs cocaine and crystal meth that he uses consistently. Continue as needed Ativan. Continue scheduled gabapentin. Scheduled Toradol.  5.  Elevated LFT.  Likely passive venous congestion from CHF Currently improving with diuresis.  Monitor for now. Right upper quadrant ultrasound in the outside facility unremarkable.  6.  Hypoalbuminemia. Likely poor nutrition. Monitor for now. Continue nutritional supplementation.  7.  Insomnia and anxiety. Continue trazodone. Continue as needed Ativan.  Continue scheduled gabapentin.  8.  Mild sinus tachycardia. Monitor for now  protection may require introduction of clonidine  9.  Hep C infection. Currently  not treated.  Management per ID.  10.  Bilateral inguinal lymphadenopathy. Seen on CT scan as well as clinical examination. Likely reactive.  Currently monitoring.  11.  Hydrocele Reported scrotal swelling. No pain or tenderness. Tells me it has been present for a while. Ultrasound Doppler negative for any acute abnormality.  Currently no evidence of skin infection. Likely third spacing. Discussed with urology recommend conservative measures and scrotal support.  12.  Acute on chronic HFpEF, primarily RV failure. Severe tricuspid regurgitation. Hypoalbuminemia with third spacing. Received IV albumin followed by IV Lasix. Continue to diurese for now. Monitor renal function and potassium.  13.  Vancomycin induced rash. Patient reported some IV site rash today secondary to vancomycin suspecting. Initiate Benadryl as needed. Continue vancomycin. Reduce the rate of the infusion.  13.  Summary of the medical records for recent stay at [facility].  UDS negative for opiates in June 2021. CT chest without contrast Septic emboli. Ultrasound abdomen hepatomegaly Bilateral lower extremity Doppler negative for DVT with bilateral inguinal lymphadenopathy Echocardiogram severe TR large vegetation on posterior leaflet and small vegetation on anterior leaflet. UDS positive for cocaine, opiates and amphetamine. Hep C antibody reactive HIV nonreactive  Diet: Regular diet to promote nutritional intake. DVT Prophylaxis:   enoxaparin (LOVENOX) injection 40 mg Start: 12/15/19 2200 Place and maintain sequential compression device Start: 12/14/19 1121  Advance goals of care discussion: Full code  Family Communication: family was present at bedside, at the time of interview.  The pt provided permission to discuss medical plan with the family. Opportunity was given to ask question and all questions were answered  satisfactorily.   Disposition:  Status is: Inpatient  Remains inpatient appropriate because:Unsafe d/c plan and IV treatments appropriate due to intensity of illness or inability to take PO   Dispo: The patient is from: Home              Anticipated d/c is to: Home              Anticipated d/c date is: > 3 days              Patient currently is not medically stable to d/c.  Subjective: Continues to have back pain.  No nausea no vomiting.  No fever no chills.  Physical Exam:  General: Appear in mild distress, IV site morbilliform rash; Oral Mucosa Clear, moist. no Abnormal Neck Mass Or lumps, Conjunctiva normal  Cardiovascular: S1 and S2 Present, aortic systolic  Murmur, Respiratory: increased respiratory effort, Bilateral Air entry present and bilateral Crackles, no wheezes Abdomen: Bowel Sound present, Soft and no tenderness, scrotal swelling still present Extremities: trace Pedal edema Neurology: alert and oriented to time, place, and person affect appropriate. no new focal deficit Gait not checked due to patient safety concerns  Vitals:   12/21/19 1220 12/21/19 1337 12/21/19 2111 12/22/19 0548  BP: 120/77 116/80 113/78 106/75  Pulse: 100 (!) 103 98 97  Resp: (!) 22 19 20 18   Temp: 98.4 F (36.9 C) 98.4 F (36.9 C) 98.5 F (36.9 C) 98.3 F (36.8 C)  TempSrc: Oral  Oral Oral  SpO2: 100% 99% 98% 99%  Weight:    67 kg  Height:        Intake/Output Summary (Last 24 hours) at 12/22/2019 1603 Last data filed at 12/22/2019 1300 Gross per 24 hour  Intake 483 ml  Output 5750 ml  Net -5267 ml   Filed Weights   12/18/19 1133 12/21/19 0500 12/22/19 0548  Weight: 62.9  kg 71.1 kg 67 kg    Data Reviewed: I have personally reviewed and interpreted daily labs, tele strips, imagings as discussed above. I reviewed all nursing notes, pharmacy notes, vitals, pertinent old records I have discussed plan of care as described above with RN and patient/family.  CBC: Recent Labs    Lab 12/16/19 0424 12/16/19 0424 12/16/19 1033 12/16/19 1715 12/18/19 0431 12/18/19 0431 12/19/19 0558 12/19/19 1650 12/20/19 0552 12/21/19 0109 12/22/19 0438  WBC 18.3*   < > 19.5*   < > 20.4*  --  22.3*  --  13.3* 13.5* 14.9*  NEUTROABS 13.1*  --  14.8*  --   --   --   --   --   --   --   --   HGB 7.1*   < > 7.4*   < > 7.2*   < > 6.5* 7.4* 7.8* 7.8* 7.8*  HCT 22.5*   < > 24.2*   < > 23.7*   < > 21.1* 23.9* 25.6* 25.8* 25.7*  MCV 88.6   < > 90.0   < > 92.6  --  92.1  --  91.1 92.1 90.5  PLT 374   < > 404*   < > 403*  --  366  --  416* 417* 448*   < > = values in this interval not displayed.   Basic Metabolic Panel: Recent Labs  Lab 12/16/19 0424 12/17/19 0447 12/18/19 0431 12/19/19 0558 12/20/19 0552 12/21/19 0109 12/22/19 0438  NA 132*   < > 133* 136 136 134* 136  K 3.9   < > 3.9 4.3 4.7 5.0 4.5  CL 102   < > 104 106 106 107 103  CO2 23   < > 20* 20* 21* 19* 23  GLUCOSE 106*   < > 110* 130* 92 104* 101*  BUN 15   < > 14 17 18 17 18   CREATININE 0.84   < > 0.80 0.85 0.77 0.73 0.79  CALCIUM 7.1*   < > 7.5* 7.6* 7.6* 7.7* 8.2*  MG 1.8  --   --   --   --  1.9 1.9  PHOS 3.0  --   --   --   --   --   --    < > = values in this interval not displayed.    Studies: CT CHEST WO CONTRAST  Result Date: 12/22/2019 CLINICAL DATA:  Pneumonia, evaluate pleural effusion, septic emboli EXAM: CT CHEST WITHOUT CONTRAST TECHNIQUE: Multidetector CT imaging of the chest was performed following the standard protocol without IV contrast. COMPARISON:  None. FINDINGS: Cardiovascular: No significant vascular findings. Normal heart size. No pericardial effusion. Mediastinum/Nodes: Prominent mediastinal lymph nodes. Thyroid gland, trachea, and esophagus demonstrate no significant findings. Lungs/Pleura: There are numerous bilateral pulmonary nodules throughout the lungs, the largest in the right pulmonary upper lobe measuring 2.4 x 2.1 cm (series 7, image 34). There are several larger nodules  demonstrating subtle cavitation (e.g. series 7, image 42). Small bilateral pleural effusions and associated atelectasis or consolidation. Upper Abdomen: No acute abnormality. Musculoskeletal: No chest wall mass or suspicious bone lesions identified. IMPRESSION: 1. There are numerous bilateral pulmonary nodules throughout the lungs, the largest in the right pulmonary upper lobe measuring 2.4 x 2.1 cm. There are several larger nodules demonstrating subtle cavitation. Findings are consistent with septic emboli. 2. Small bilateral pleural effusions and associated atelectasis or consolidation. 3. Prominent mediastinal lymph nodes, likely reactive. Electronically Signed   By: 02/21/2020  M.D.   On: 12/22/2019 13:18    Scheduled Meds: . (feeding supplement) PROSource Plus  30 mL Oral BID BM  . enoxaparin (LOVENOX) injection  40 mg Subcutaneous Q24H  . famotidine  20 mg Oral Daily  . feeding supplement (ENSURE ENLIVE)  237 mL Oral TID BM  . ferrous fumarate-b12-vitamic C-folic acid  1 capsule Oral BID PC  . furosemide  40 mg Intravenous BID  . gabapentin  300 mg Oral TID  . melatonin  5 mg Oral QHS  . multivitamin with minerals  1 tablet Oral Daily  . naproxen  500 mg Oral TID WC  . sodium chloride flush  3 mL Intravenous Q12H  . spironolactone  12.5 mg Oral Daily  . traZODone  50 mg Oral QHS   Continuous Infusions: . sodium chloride Stopped (12/17/19 1915)  . lactated ringers 10 mL/hr at 12/18/19 1227  . vancomycin 1,000 mg (12/22/19 0553)   PRN Meds: sodium chloride, acetaminophen, diphenhydrAMINE, HYDROcodone-acetaminophen, HYDROmorphone (DILAUDID) injection, levalbuterol, LORazepam, nitroGLYCERIN, ondansetron (ZOFRAN) IV, sodium chloride flush  Time spent: 35 minutes  Author: Lynden Oxford, MD [facility] 12/22/2019 4:03 PM  To reach On-call, see care teams to locate the attending and reach out via [email or web address]. Between 7PM-7AM, please contact night-coverage If you still have  difficulty reaching the attending provider, please page the [facility] (Director on Call) for [facility] on amion for assistance.

## 2019-12-22 NOTE — Plan of Care (Signed)
  Problem: Health Behavior/Discharge Planning: Goal: Ability to manage health-related needs will improve Outcome: Progressing   Problem: Clinical Measurements: Goal: Ability to maintain clinical measurements within normal limits will improve Outcome: Progressing Goal: Will remain free from infection Outcome: Progressing Goal: Diagnostic test results will improve Outcome: Progressing Goal: Cardiovascular complication will be avoided Outcome: Progressing   Problem: Activity: Goal: Risk for activity intolerance will decrease Outcome: Progressing   Problem: Nutrition: Goal: Adequate nutrition will be maintained Outcome: Progressing   Problem: Coping: Goal: Level of anxiety will decrease Outcome: Progressing   Problem: Pain Managment: Goal: General experience of comfort will improve Outcome: Progressing   Problem: Safety: Goal: Ability to remain free from injury will improve Outcome: Progressing   Problem: Skin Integrity: Goal: Risk for impaired skin integrity will decrease Outcome: Progressing   

## 2019-12-22 NOTE — Progress Notes (Signed)
[facility]    Date of Admission:  12/14/2019   Total days of antibiotics 4           Abx: 10/04-c vancomycin   A/p: 34 y.o. 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  10/04 and 10/06 bcx positive MRSA (vanc mic 0.5); 10/10 first negative but pending final report 10/08 last documented fever  CT surgery evaluated not candidate for AngioVac. Future TV valve repair will likely be needed given TV unable to coapt  Other issues: Chronic hep c Hep b serology negative;  Last hiv screen 12/2018 negative  ----- Will need to investigate the pleural effusion more given rising leukocytosis/platelet level. Previous cxr mentions small bilateral pleural effusion but mri suggested more   -I will get chest ct to clarify the pleural effusion process and see if intervention is needed -f/u hiv serology and rpr serology -hep c tx can be done outpatient on ID clinic f/u -will benefit from hep b vaccination as well  -continue vancomycin per pharmacy -f/u repeat bcx -will need repeat mri scan lumbar area in 3-4 weeks -monitor for evoluation of periarticular knee pain; doesn't appear to be septic arthritis or abscesses at this point   Principal Problem:   Endocarditis Active Problems:   Polysubstance dependence (HCC)   IVDU (intravenous drug user)   Acute CHF (congestive heart failure) (HCC)   Anemia   Acute septic pulmonary embolism (HCC)   Goals of care, counseling/discussion   Palliative care encounter   Chronic hepatitis C without hepatic coma (HCC)   Infective  myositis    Subjective: Fever resolved 10/08 Improve pain but significant still in lower back and bilateral LE around knees No rash No sob, chest pain, cough No headache or visual changes  Medications:  . (feeding supplement) PROSource Plus  30 mL Oral BID BM  . enoxaparin (LOVENOX) injection  40 mg Subcutaneous Q24H  . famotidine  20 mg Oral Daily  . feeding supplement (ENSURE ENLIVE)  237 mL Oral TID BM  . ferrous fumarate-b12-vitamic C-folic acid  1 capsule Oral BID PC  . gabapentin  300 mg Oral TID  . melatonin  5 mg Oral QHS  . multivitamin with minerals  1 tablet Oral Daily  . naproxen  500 mg Oral TID WC  . sodium chloride flush  3 mL Intravenous Q12H  . traZODone  50 mg Oral QHS    Objective: Vital signs in last 24 hours: Temp:  [98.3 F (36.8 C)-98.5 F (36.9 C)] 98.3 F (36.8 C) (10/12 0548) Pulse Rate:  [97-103] 97 (10/12 0548) Resp:  [17-22] 18 (10/12 0548) BP: (106-121)/(75-84) 106/75 (10/12 0548) SpO2:  [98 %-100 %] 99 % (10/12 0548) Weight:  [67 kg] 67 kg (10/12 0548)  Physical Exam  Constitutional: mild distress; conversant; cooperative HEENT: Oropharynx is clear; per; conj clear; eomi CV: RRR, systolic murmur left sternal border Neck = elevated JVP to angle of jaw Pulmonary; normal respiratory effort; clear lungs Abdominal: Soft, nt Neurological: nonfocal Skin: no rash Psychiatric: alert/oriented   Lab Results Recent Labs    12/21/19 0109 12/22/19 0438  WBC 13.5* 14.9*  HGB 7.8* 7.8*  HCT 25.8* 25.7*  NA 134* 136  K 5.0 4.5  CL 107 103  CO2 19* 23  BUN 17 18  CREATININE 0.73 0.79   Liver Panel Recent Labs    12/21/19 0109 12/22/19 0438  PROT 6.5 7.0  ALBUMIN 1.5* 1.8*  AST 39 23  ALT 52* 41  ALKPHOS 233* 222*  BILITOT 0.7 0.7   Sedimentation Rate No results for input(s): ESRSEDRATE in the last 72 hours.  C-Reactive Protein No results for input(s): CRP in the last 72 hours.  Microbiology: 10/6 blood cx + 10/4 blood cx  +  Imaging: 10/08 cxr Small bilateral pleural effusion Unchanged diffuse heterogenous nodular and patchy opacity both lungs   10/08 mr thoracolumbar 1. No evidence for osteomyelitis discitis or septic arthritis within the thoracic or lumbar spine. No epidural abscess or other intraspinal infection. 2. Diffuse edema throughout the lower lumbar posterior paraspinous musculature, with similar changes along the posterior margins of the psoas musculature bilaterally. Findings suggestive of acute myositis, likely infectious in nature. No loculated soft tissue collections or abscess. 3. Large irregular bilateral pleural effusions with associated cystic/cavitary changes within the partially visualized lungs, consistent with known history of septic emboli. 4. Shallow left subarticular to foraminal disc protrusion at L5-S1 with resultant mild left foraminal and left lateral recess stenosis.  Tonita Phoenix [facility] Cell: [phone] Pager: [phone]  12/22/2019, 6:35 AM

## 2019-12-23 DIAGNOSIS — J189 Pneumonia, unspecified organism: Secondary | ICD-10-CM

## 2019-12-23 LAB — COMPREHENSIVE METABOLIC PANEL
ALT: 37 U/L (ref 0–44)
AST: 23 U/L (ref 15–41)
Albumin: 1.9 g/dL — ABNORMAL LOW (ref 3.5–5.0)
Alkaline Phosphatase: 283 U/L — ABNORMAL HIGH (ref 38–126)
Anion gap: 10 (ref 5–15)
BUN: 27 mg/dL — ABNORMAL HIGH (ref 6–20)
CO2: 24 mmol/L (ref 22–32)
Calcium: 8.2 mg/dL — ABNORMAL LOW (ref 8.9–10.3)
Chloride: 101 mmol/L (ref 98–111)
Creatinine, Ser: 1.01 mg/dL (ref 0.61–1.24)
GFR, Estimated: >60 mL/min (ref >60)
Glucose, Bld: 84 mg/dL (ref 70–99)
Potassium: 4.5 mmol/L (ref 3.5–5.1)
Sodium: 135 mmol/L (ref 135–145)
Total Bilirubin: 0.8 mg/dL (ref 0.3–1.2)
Total Protein: 7.3 g/dL (ref 6.5–8.1)

## 2019-12-23 LAB — CBC WITH DIFFERENTIAL/PLATELET
Abs Immature Granulocytes: 0.09 10*3/uL — ABNORMAL HIGH (ref 0.00–0.07)
Basophils Absolute: 0.1 10*3/uL (ref 0.0–0.1)
Basophils Relative: 0 %
Eosinophils Absolute: 0.3 10*3/uL (ref 0.0–0.5)
Eosinophils Relative: 2 %
HCT: 24.3 % — ABNORMAL LOW (ref 39.0–52.0)
Hemoglobin: 7.7 g/dL — ABNORMAL LOW (ref 13.0–17.0)
Immature Granulocytes: 1 %
Lymphocytes Relative: 22 %
Lymphs Abs: 3.4 10*3/uL (ref 0.7–4.0)
MCH: 28.2 pg (ref 26.0–34.0)
MCHC: 31.7 g/dL (ref 30.0–36.0)
MCV: 89 fL (ref 80.0–100.0)
Monocytes Absolute: 0.7 10*3/uL (ref 0.1–1.0)
Monocytes Relative: 4 %
Neutro Abs: 11.1 10*3/uL — ABNORMAL HIGH (ref 1.7–7.7)
Neutrophils Relative %: 71 %
Platelets: 417 10*3/uL — ABNORMAL HIGH (ref 150–400)
RBC: 2.73 MIL/uL — ABNORMAL LOW (ref 4.22–5.81)
RDW: 19.3 % — ABNORMAL HIGH (ref 11.5–15.5)
WBC: 15.7 10*3/uL — ABNORMAL HIGH (ref 4.0–10.5)
nRBC: 0 % (ref 0.0–0.2)

## 2019-12-23 LAB — MAGNESIUM: Magnesium: 1.9 mg/dL (ref 1.7–2.4)

## 2019-12-23 NOTE — Progress Notes (Signed)
Physical Therapy Treatment Patient Details Name: Alex [name] MRN: [id number] DOB: [birth year] Today's Date: 12/23/2019    History of Present Illness 34 yo male admitted with endocarditis, back pain. Imaging (+) paraspinal myositis. Hx of asthma, polysubstance abuse, ADHD, asthma    PT Comments    General Gait Details: able to self navigate around the room to and from bathroom self pushing IV pole as needed.  Assisted with amb in hallway same.  Distance limited by dyspnea, fatihue, effort.  No balance instability observed.  Good safety cognition and envirnmental awareness.  Very limited endurance 2nd Dx.   Follow Up Recommendations  No PT follow up     Equipment Recommendations  None recommended by PT    Recommendations for Other Services       Precautions / Restrictions Precautions Precautions: Fall    Mobility  Bed Mobility Overal bed mobility: Modified Independent                Transfers Overall transfer level: Modified independent                  Ambulation/Gait Ambulation/Gait assistance: Modified independent (Device/Increase time) Gait Distance (Feet): 105 Feet Assistive device: IV Pole Gait Pattern/deviations: Step-through pattern;Decreased stride length Gait velocity: decreased   General Gait Details: able to self navigate around the room to and from bathroom self pushing IV pole as needed.  Assisted with amb in hallway same.  Distance limited by dyspnea, fatihue, effort.  No balance instability observed.  Good safety cognition and envirnmental awareness.  Very limited endurance 2nd Dx.   Stairs             Wheelchair Mobility    Modified Rankin (Stroke Patients Only)       Balance                                            Cognition   Behavior During Therapy: WFL for tasks assessed/performed Overall Cognitive Status: Within Functional Limits for tasks assessed                                  General Comments: AxO x 3      Exercises      General Comments        Pertinent Vitals/Pain Pain Assessment: No/denies pain    Home Living                      Prior Function            PT Goals (current goals can now be found in the care plan section) Progress towards PT goals: Progressing toward goals    Frequency    Min 2X/week      PT Plan Current plan remains appropriate    Co-evaluation              AM-PAC PT "6 Clicks" Mobility   Outcome Measure  Help needed turning from your back to your side while in a flat bed without using bedrails?: None Help needed moving from lying on your back to sitting on the side of a flat bed without using bedrails?: None Help needed moving to and from a bed to a chair (including a wheelchair)?: None Help needed standing up from a chair using your arms (e.g.,  wheelchair or bedside chair)?: None Help needed to walk in hospital room?: None Help needed climbing 3-5 steps with a railing? : A Little 6 Click Score: 23    End of Session Equipment Utilized During Treatment: Gait belt Activity Tolerance: Other (comment) (limited activity tolerance due to Dx) Patient left: in bed;with call bell/phone within reach;with bed alarm set Nurse Communication: Mobility status PT Visit Diagnosis: Unsteadiness on feet (R26.81)     Time: 1418-1430 PT Time Calculation (min) (ACUTE ONLY): 12 min  Charges:  $Gait Training: 8-22 mins                     {Avriel Kandel  PTA Acute  Rehabilitation [facility] [facility]      [phone] Office      [phone]

## 2019-12-23 NOTE — Progress Notes (Signed)
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  Patient admitted with significant bilateral leg swelling, shortness of breath, positional back pain.  Chest x-ray was concerning for septic emboli and possibly superimposed pneumonia.  ED physician discussed with ID who recommended vancomycin for now.    Subjective   Patient seen and examined, denies any complaints.   Assessment/Plan:     1. 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  Blood culture positive on admission, repeat blood cultures on 12/20/2019 so for shows no growth.  CT surgeon Dr. Cliffton Asters was consulted he felt that endocarditis appears to be chronic, patient will not benefit from angiovac.  Patient also not a candidate for tricuspid valve replacement due to ongoing IV drug use.  ID following, recommending 6 to 8 weeks of antibiotics and repeat MRI lumbar spine in 3 to 4 weeks.  Patient will need long-term suppression antibiotics since he is not candidate for surgery. 2. Iron deficiency-continue oral iron and vitamin C.  Transfuse for hemoglobin less than 7. 3. Polysubstance abuse-UDS positive for amphetamine and opiates. 4. Scrotal swelling-ultrasound with Doppler was negative for acute abnormality.  Likely third spacing from hypoalbuminemia.  Albumin is 1.8.  Dr. Allena Katz discussed with urology who recommended conservative measures and scrotal support. 5. Hepatitis C infection-not treated, management per  ID. 6. Transaminitis-improved with IV Lasix, likely from passive liver congestion.     COVID-19 Labs  No results for input(s): DDIMER, FERRITIN, LDH, CRP in the last 72 hours.  Lab Results  Component Value Date   SARSCOV2NAA NEGATIVE 12/14/2019   SARSCOV2NAA NEGATIVE 08/19/2019   SARSCOV2NAA NEGATIVE 12/28/2018   SARSCOV2NAA NEGATIVE 12/28/2018     Scheduled medications:   . (feeding supplement) PROSource Plus  30 mL Oral BID BM  . enoxaparin (LOVENOX) injection  40 mg Subcutaneous Q24H  . famotidine  20 mg Oral Daily  . feeding supplement  237 mL Oral TID BM  . ferrous fumarate-b12-vitamic C-folic acid  1 capsule Oral BID PC  . gabapentin  300 mg Oral TID  . melatonin  5 mg Oral QHS  . multivitamin with minerals  1 tablet Oral Daily  . naproxen  500 mg Oral TID WC  . sodium chloride flush  3 mL Intravenous Q12H  . spironolactone  12.5 mg Oral Daily  . traZODone  50 mg Oral QHS         CBG: No results for input(s): GLUCAP in the last 168 hours.  SpO2: 98 % O2 Flow Rate (L/min): 2 L/min    CBC: Recent Labs  Lab 12/19/19 0558 12/19/19 0558 12/19/19 1650 12/20/19 0552 12/21/19 0109 12/22/19 0438 12/23/19 0518  WBC 22.3*  --   --  13.3* 13.5* 14.9* 15.7*  NEUTROABS  --   --   --   --   --   --  11.1*  HGB 6.5*   < > 7.4* 7.8* 7.8* 7.8* 7.7*  HCT 21.1*   < > 23.9* 25.6* 25.8* 25.7* 24.3*  MCV 92.1  --   --  91.1 92.1 90.5 89.0  PLT 366  --   --  416* 417* 448* 417*   < > = values in this interval not displayed.    Basic Metabolic Panel: Recent Labs  Lab 12/19/19 0558 12/20/19 0552 12/21/19 0109 12/22/19 0438 12/23/19 0518  NA 136 136 134* 136 135  K 4.3 4.7 5.0 4.5 4.5  CL 106 106 107 103 101  CO2 20* 21* 19* 23 24  GLUCOSE 130* 92 104* 101* 84  BUN 17 18 17 18  27*  CREATININE 0.85 0.77 0.73 0.79 1.01  CALCIUM 7.6* 7.6* 7.7* 8.2* 8.2*  MG  --   --  1.9 1.9 1.9     Liver Function Tests: Recent Labs  Lab 12/19/19 0558 12/20/19 0552  12/21/19 0109 12/22/19 0438 12/23/19 0518  AST 81* 61* 39 23 23  ALT 70* 65* 52* 41 37  ALKPHOS 334* 245* 233* 222* 283*  BILITOT 1.0 0.9 0.7 0.7 0.8  PROT 6.1* 6.2* 6.5 7.0 7.3  ALBUMIN 1.4* 1.4* 1.5* 1.8* 1.9*     Antibiotics: Anti-infectives (From admission, onward)   Start     Dose/Rate Route Frequency Ordered Stop   12/21/19 1800  vancomycin (VANCOCIN) IVPB 1000 mg/200 mL premix        1,000 mg 200 mL/hr over 60 Minutes Intravenous Every 12 hours 12/21/19 1004     12/17/19 1400  vancomycin (VANCOREADY) IVPB 1500 mg/300 mL  Status:  Discontinued        1,500 mg 150 mL/hr over 120 Minutes Intravenous Every 12 hours 12/17/19 1227 12/21/19 1004   12/14/19 1800  vancomycin (VANCOREADY) IVPB 750 mg/150 mL  Status:  Discontinued        750 mg 150 mL/hr over 60 Minutes Intravenous Every 12 hours 12/14/19 1103 12/17/19 1227   12/14/19 0900  vancomycin (VANCOCIN) IVPB 1000 mg/200 mL premix        1,000 mg 200 mL/hr over 60 Minutes Intravenous  Once 12/14/19 0821 12/14/19 1142   12/14/19 0830  piperacillin-tazobactam (ZOSYN) IVPB 3.375 g  Status:  Discontinued        3.375 g 12.5 mL/hr over 240 Minutes Intravenous  Once 12/14/19 0821 12/14/19 0855       DVT prophylaxis: Lovenox  Code Status: Full code  Family Communication: No family at bedside    Status is: Inpatient  Dispo: The patient is from: Home              Anticipated d/c is to: Home              Anticipated d/c date is: 6 weeks              Patient currently not medically stable for discharge  Barrier to discharge-IV antibiotics for septic emboli, psoas and number paraspinous myositis          Consultants:  CT surgery  Procedures:     Objective   Vitals:   12/22/19 1415 12/22/19 2055 12/23/19 0404 12/23/19 1234  BP: 110/78 113/82 113/69 118/80  Pulse: 96 93 92 96  Resp: 14 18 16    Temp: 98.2 F (36.8 C) 97.9 F (36.6 C) 97.9 F (36.6 C) 98.6 F (37 C)  TempSrc: Oral Oral Oral Oral   SpO2: 98% 99% 100% 98%  Weight:   66.9 kg   Height:        Intake/Output Summary (Last 24 hours) at 12/23/2019 1758 Last data filed at 12/23/2019 1649 Gross per 24 hour  Intake 2040 ml  Output 2900 ml  Net -860 ml    10/11 1901 -  10/13 0700 In: 2043 [P.O.:1440; I.V.:3] Out: 4700 [Urine:4700]  Filed Weights   12/21/19 0500 12/22/19 0548 12/23/19 0404  Weight: 71.1 kg 67 kg 66.9 kg    Physical Examination:    General: Appears in no acute distress  Cardiovascular: S1-S2, regular, no murmur auscultated  Respiratory: Clear to auscultation bilaterally  Abdomen: Abdomen is soft, nontender, no organomegaly, scrotal edema  Extremities-no edema in the lower extremities  Neurologic: , Oriented x3, intact insight and judgment    Data Reviewed:   Recent Results (from the past 240 hour(s))  Blood culture (routine x 2)     Status: Abnormal   Collection Time: 12/14/19  7:56 AM   Specimen: BLOOD  Result Value Ref Range Status   Specimen Description   Final    BLOOD RIGHT ANTECUBITAL Performed at [facility], [address] [place], [place] [address]    Special Requests   Final    BOTTLES DRAWN AEROBIC AND ANAEROBIC Blood Culture results may not be optimal due to an excessive volume of blood received in culture bottles Performed at [facility], [address] [place], [place] [address]    Culture  Setup Time   Final    GRAM POSITIVE COCCI IN BOTH AEROBIC AND ANAEROBIC BOTTLES CRITICAL VALUE NOTED.  VALUE IS CONSISTENT WITH PREVIOUSLY REPORTED AND CALLED VALUE.    Culture (A)  Final    STAPHYLOCOCCUS AUREUS SUSCEPTIBILITIES PERFORMED ON PREVIOUS CULTURE WITHIN THE LAST 5 DAYS. Performed at [facility], [address] [place], [place] [address]    Report Status 12/17/2019 FINAL  Final  Blood culture (routine x 2)     Status: Abnormal   Collection Time: 12/14/19  8:09 AM   Specimen: BLOOD  Result Value Ref Range  Status   Specimen Description   Final    BLOOD LEFT ANTECUBITAL Performed at [facility], [address] [place], [place] [address]    Special Requests   Final    BOTTLES DRAWN AEROBIC AND ANAEROBIC Blood Culture adequate volume Performed at [facility], [address] [place], [place] [address]    Culture  Setup Time   Final    GRAM POSITIVE COCCI IN BOTH AEROBIC AND ANAEROBIC BOTTLES CRITICAL RESULT CALLED TO, READ BACK BY AND VERIFIED WITH: PHARMD E JACKSON 12/15/19 AT 0044 SK Performed at [facility], [address] [place], [place] [address]    Culture METHICILLIN RESISTANT STAPHYLOCOCCUS AUREUS (A)  Final   Report Status 12/17/2019 FINAL  Final   Organism ID, Bacteria METHICILLIN RESISTANT STAPHYLOCOCCUS AUREUS  Final      Susceptibility   Methicillin resistant staphylococcus aureus - MIC*    CIPROFLOXACIN >=8 RESISTANT Resistant     ERYTHROMYCIN >=8 RESISTANT Resistant     GENTAMICIN <=0.5 SENSITIVE Sensitive     OXACILLIN >=4 RESISTANT Resistant     TETRACYCLINE <=1 SENSITIVE Sensitive     VANCOMYCIN <=0.5 SENSITIVE Sensitive     TRIMETH/SULFA <=10 SENSITIVE Sensitive     CLINDAMYCIN <=0.25 SENSITIVE Sensitive     RIFAMPIN <=0.5 SENSITIVE Sensitive     Inducible Clindamycin NEGATIVE Sensitive     * METHICILLIN RESISTANT STAPHYLOCOCCUS AUREUS  Blood Culture ID Panel (Reflexed)     Status: Abnormal   Collection Time: 12/14/19  8:09 AM  Result Value Ref Range Status   Enterococcus faecalis NOT DETECTED NOT DETECTED Final   Enterococcus Faecium NOT DETECTED NOT DETECTED Final   Listeria monocytogenes NOT  DETECTED NOT DETECTED Final   Staphylococcus species DETECTED (A) NOT DETECTED Final    Comment: CRITICAL RESULT CALLED TO, READ BACK BY AND VERIFIED WITH: PHARMD E JACKSON 12/15/19 AT 0044 SK    Staphylococcus aureus (BCID) DETECTED (A) NOT DETECTED Final    Comment: Methicillin (oxacillin)-resistant Staphylococcus aureus (MRSA).  MRSA is predictably resistant to beta-lactam antibiotics (except ceftaroline). Preferred therapy is vancomycin unless clinically contraindicated. Patient requires contact precautions if  hospitalized. CRITICAL RESULT CALLED TO, READ BACK BY AND VERIFIED WITH: PHARMD E JACKSON 12/15/19 AT 0044 SK    Staphylococcus epidermidis NOT DETECTED NOT DETECTED Final   Staphylococcus lugdunensis NOT DETECTED NOT DETECTED Final   Streptococcus species NOT DETECTED NOT DETECTED Final   Streptococcus agalactiae NOT DETECTED NOT DETECTED Final   Streptococcus pneumoniae NOT DETECTED NOT DETECTED Final   Streptococcus pyogenes NOT DETECTED NOT DETECTED Final   A.calcoaceticus-baumannii NOT DETECTED NOT DETECTED Final   Bacteroides fragilis NOT DETECTED NOT DETECTED Final   Enterobacterales NOT DETECTED NOT DETECTED Final   Enterobacter cloacae complex NOT DETECTED NOT DETECTED Final   Escherichia coli NOT DETECTED NOT DETECTED Final   Klebsiella aerogenes NOT DETECTED NOT DETECTED Final   Klebsiella oxytoca NOT DETECTED NOT DETECTED Final   Klebsiella pneumoniae NOT DETECTED NOT DETECTED Final   Proteus species NOT DETECTED NOT DETECTED Final   Salmonella species NOT DETECTED NOT DETECTED Final   Serratia marcescens NOT DETECTED NOT DETECTED Final   Haemophilus influenzae NOT DETECTED NOT DETECTED Final   Neisseria meningitidis NOT DETECTED NOT DETECTED Final   Pseudomonas aeruginosa NOT DETECTED NOT DETECTED Final   Stenotrophomonas maltophilia NOT DETECTED NOT DETECTED Final   Candida albicans NOT DETECTED NOT DETECTED Final   Candida auris NOT DETECTED NOT DETECTED Final   Candida glabrata NOT DETECTED NOT DETECTED Final   Candida krusei NOT DETECTED NOT DETECTED Final   Candida parapsilosis NOT DETECTED NOT DETECTED Final   Candida tropicalis NOT DETECTED NOT DETECTED Final   Cryptococcus neoformans/gattii NOT DETECTED NOT DETECTED Final   Meth resistant mecA/C and MREJ DETECTED (A) NOT DETECTED  Final    Comment: CRITICAL RESULT CALLED TO, READ BACK BY AND VERIFIED WITH: PHARMD E JACKSON 12/15/19 AT 0044 SK Performed at [facility], [address] [place], [place] [address]   Respiratory Panel by RT PCR (Flu A&B, Covid) - Nasopharyngeal Swab     Status: None   Collection Time: 12/14/19  8:10 AM   Specimen: Nasopharyngeal Swab  Result Value Ref Range Status   SARS Coronavirus 2 by RT PCR NEGATIVE NEGATIVE Final    Comment: (NOTE) SARS-CoV-2 target nucleic acids are NOT DETECTED.  The SARS-CoV-2 RNA is generally detectable in upper respiratoy specimens during the acute phase of infection. The lowest concentration of SARS-CoV-2 viral copies this assay can detect is 131 copies/mL. A negative result does not preclude SARS-Cov-2 infection and should not be used as the sole basis for treatment or other patient management decisions. A negative result may occur with  improper specimen collection/handling, submission of specimen other than nasopharyngeal swab, presence of viral mutation(s) within the areas targeted by this assay, and inadequate number of viral copies (<131 copies/mL). A negative result must be combined with clinical observations, patient history, and epidemiological information. The expected result is Negative.  Fact Sheet for Patients:  https://www.moore.com/  Fact Sheet for Healthcare Providers:  https://www.young.biz/  This test is no t yet approved or cleared by the [place] and  has been authorized  for detection and/or diagnosis of SARS-CoV-2 by FDA under an Emergency Use Authorization (EUA). This EUA will remain  in effect (meaning this test can be used) for the duration of the COVID-19 declaration under Section 564(b)(1) of the Act, 21 U.S.C. section 360bbb-3(b)(1), unless the authorization is terminated or revoked sooner.     Influenza A by PCR NEGATIVE NEGATIVE Final   Influenza B by PCR NEGATIVE  NEGATIVE Final    Comment: (NOTE) The Xpert Xpress SARS-CoV-2/FLU/RSV assay is intended as an aid in  the diagnosis of influenza from Nasopharyngeal swab specimens and  should not be used as a sole basis for treatment. Nasal washings and  aspirates are unacceptable for Xpert Xpress SARS-CoV-2/FLU/RSV  testing.  Fact Sheet for Patients: https://www.moore.com/  Fact Sheet for Healthcare Providers: https://www.young.biz/  This test is not yet approved or cleared by the [place] FDA and  has been authorized for detection and/or diagnosis of SARS-CoV-2 by  FDA under an Emergency Use Authorization (EUA). This EUA will remain  in effect (meaning this test can be used) for the duration of the  Covid-19 declaration under Section 564(b)(1) of the Act, 21  U.S.C. section 360bbb-3(b)(1), unless the authorization is  terminated or revoked. Performed at [facility], [address], [place], [place] [address]   Urine culture     Status: None   Collection Time: 12/14/19 11:44 AM   Specimen: Urine, Clean Catch  Result Value Ref Range Status   Specimen Description   Final    URINE, CLEAN CATCH Performed at [facility], [address], [place], [place] [address]    Special Requests   Final    NONE Performed at [facility], [address] [place], [place] [address]    Culture   Final    NO GROWTH Performed at [facility], [address], [place], [place] [address]    Report Status 12/15/2019 FINAL  Final  Culture, blood (routine x 2)     Status: Abnormal   Collection Time: 12/16/19 10:48 AM   Specimen: BLOOD  Result Value Ref Range Status   Specimen Description   Final    BLOOD LEFT ARM Performed at [facility], [address], [place], [place] [address]    Special Requests   Final    BOTTLES DRAWN AEROBIC ONLY Blood Culture results may not be optimal due to  an inadequate volume of blood received in culture bottles Performed at [facility], [address] [place], [place] [address]    Culture  Setup Time   Final    GRAM POSITIVE COCCI IN CLUSTERS AEROBIC BOTTLE ONLY CRITICAL VALUE NOTED.  VALUE IS CONSISTENT WITH PREVIOUSLY REPORTED AND CALLED VALUE.    Culture (A)  Final    STAPHYLOCOCCUS AUREUS SUSCEPTIBILITIES PERFORMED ON PREVIOUS CULTURE WITHIN THE LAST 5 DAYS. Performed at [facility], [address] [place], [place] [address]    Report Status 12/18/2019 FINAL  Final  Culture, blood (routine x 2)     Status: None   Collection Time: 12/16/19 10:53 AM   Specimen: BLOOD RIGHT HAND  Result Value Ref Range Status   Specimen Description   Final    BLOOD RIGHT HAND Performed at [facility], [address] [place], [place] [address]    Special Requests   Final    BOTTLES DRAWN AEROBIC ONLY Blood Culture adequate volume Performed at [facility], [address] [place], [place] [address]    Culture   Final    NO GROWTH 5 DAYS Performed at [facility], [address], [place], [place] [address]    Report Status 12/21/2019 FINAL  Final  Culture, blood (routine x 2)     Status: None (Preliminary result)   Collection Time: 12/20/19  5:52 AM   Specimen: BLOOD  Result Value Ref Range Status   Specimen Description   Final    BLOOD BLOOD LEFT HAND Performed at [facility], [address], [place], [place] [address]    Special Requests   Final    BOTTLES DRAWN AEROBIC ONLY Blood Culture adequate volume Performed at [facility], [address], [place], [place] [address]    Culture   Final    NO GROWTH 3 DAYS Performed at [facility], [address] [place], [place] [address]    Report Status PENDING  Incomplete  Culture, blood (routine x 2)     Status: None (Preliminary result)   Collection Time: 12/20/19  6:07  AM   Specimen: BLOOD  Result Value Ref Range Status   Specimen Description   Final    BLOOD BLOOD LEFT HAND Performed at [facility], [address], [place], [place] [address]    Special Requests   Final    BOTTLES DRAWN AEROBIC ONLY Blood Culture adequate volume Performed at [facility], [address] [place], [place] [address]    Culture   Final    NO GROWTH 3 DAYS Performed at [facility], [address], [place], [place] [address]    Report Status PENDING  Incomplete    No results for input(s): LIPASE, AMYLASE in the last 168 hours. No results for input(s): AMMONIA in the last 168 hours.  Cardiac Enzymes: No results for input(s): CKTOTAL, CKMB, CKMBINDEX, TROPONINI in the last 168 hours. BNP (last 3 results) Recent Labs    12/14/19 0810  BNP 526.1*    ProBNP (last 3 results) No results for input(s): PROBNP in the last 8760 hours.  Studies:  CT CHEST WO CONTRAST  Result Date: 12/22/2019 CLINICAL DATA:  Pneumonia, evaluate pleural effusion, septic emboli EXAM: CT CHEST WITHOUT CONTRAST TECHNIQUE: Multidetector CT imaging of the chest was performed following the standard protocol without IV contrast. COMPARISON:  None. FINDINGS: Cardiovascular: No significant vascular findings. Normal heart size. No pericardial effusion. Mediastinum/Nodes: Prominent mediastinal lymph nodes. Thyroid gland, trachea, and esophagus demonstrate no significant findings. Lungs/Pleura: There are numerous bilateral pulmonary nodules throughout the lungs, the largest in the right pulmonary upper lobe measuring 2.4 x 2.1 cm (series 7, image 34). There are several larger nodules demonstrating subtle cavitation (e.g. series 7, image 42). Small bilateral pleural effusions and associated atelectasis or consolidation. Upper Abdomen: No acute abnormality. Musculoskeletal: No chest wall mass or suspicious bone lesions identified. IMPRESSION: 1. There are numerous  bilateral pulmonary nodules throughout the lungs, the largest in the right pulmonary upper lobe measuring 2.4 x 2.1 cm. There are several larger nodules demonstrating subtle cavitation. Findings are consistent with septic emboli. 2. Small bilateral pleural effusions and associated atelectasis or consolidation. 3. Prominent mediastinal lymph nodes, likely reactive. Electronically Signed   By: Lauralyn Primes M.D.   On: 12/22/2019 13:18       Diania Co S Girtha Kilgore   [facility] If 7PM-7AM, please contact night-coverage at [email or web address], Office  [phone]   12/23/2019, 5:58 PM  LOS:  9 days

## 2019-12-23 NOTE — Progress Notes (Signed)
[facility]    Date of Admission:  12/14/2019   Total days of antibiotics 4           Abx: 10/04-c vancomycin   A/p: 34 y.o. 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  10/04 and 10/06 bcx positive MRSA (vanc mic 0.5); 10/10 first negative but pending final report 10/08 last documented fever Moderate pleural effusion on mri thoracic spine but only small on 10/12 chest ct  CT surgery evaluated not candidate for AngioVac. Future TV valve repair will likely be needed given TV unable to coapt  Other issues: Chronic hep c Hep b serology negative;  Last hiv screen 12/2018 negative  ----- Reviewed chest ct nothing to do at this time Stable moderate leukocytosis/thrombocytosis     -hep c tx can be done outpatient on ID clinic f/u -will benefit from hep b vaccination as well  -continue vancomycin per pharmacy -f/u repeat bcx -will need repeat mri scan lumbar area in 3-4 week (around first week November)  -will space out labs to q72hours to weekly   Principal Problem:   Endocarditis Active Problems:   Polysubstance dependence (HCC)   IVDU (intravenous drug user)   Acute CHF (congestive heart failure) (HCC)   Anemia   Acute septic pulmonary embolism (HCC)   Goals of care, counseling/discussion   Palliative care encounter   Chronic hepatitis C without hepatic coma (HCC)   Infective myositis   Pleural effusion, bilateral    Subjective: Fever resolved 10/08 Stable hemodynanics Denies sob, chest pain Moderate lower back back improving No  n/v/diarrhea/rash  Medications:  . (feeding supplement) PROSource Plus  30 mL Oral BID BM  . enoxaparin (LOVENOX) injection  40 mg Subcutaneous Q24H  . famotidine  20 mg Oral Daily  . feeding supplement (ENSURE ENLIVE)  237 mL Oral TID BM  . ferrous fumarate-b12-vitamic C-folic acid  1 capsule Oral BID PC  . gabapentin  300 mg Oral TID  . melatonin  5 mg Oral QHS  . multivitamin with minerals  1 tablet Oral Daily  . naproxen  500 mg Oral TID WC  . sodium chloride flush  3 mL Intravenous Q12H  . spironolactone  12.5 mg Oral Daily  . traZODone  50 mg Oral QHS    Objective: Vital signs in last 24 hours: Temp:  [97.9 F (36.6 C)-98.2 F (36.8 C)] 97.9 F (36.6 C) (10/13 0404) Pulse Rate:  [92-96] 92 (10/13 0404) Resp:  [14-18] 16 (10/13 0404) BP: (110-113)/(69-82) 113/69 (10/13 0404) SpO2:  [98 %-100 %] 100 % (10/13 0404) Weight:  [66.9 kg] 66.9 kg (10/13 0404)  Physical Exam  Constitutional: mild distress; conversant; cooperative HEENT: Oropharynx is clear; per; conj clear; eomi CV: RRR, systolic murmur left sternal border Neck = elevated JVP to angle of jaw Pulmonary; normal respiratory effort; clear lungs Abdominal: Soft, nt Neurological: nonfocal Skin: no rash Psychiatric: alert/oriented   Lab Results Recent Labs    12/22/19 0438 12/23/19 0518  WBC 14.9* 15.7*  HGB 7.8* 7.7*  HCT 25.7* 24.3*  NA 136 135  K 4.5 4.5  CL 103 101  CO2 23 24  BUN 18 27*  CREATININE 0.79 1.01   Liver Panel Recent Labs    12/22/19 0438 12/23/19 0518  PROT 7.0 7.3  ALBUMIN 1.8* 1.9*  AST 23 23  ALT 41 37  ALKPHOS 222* 283*  BILITOT 0.7 0.8   Sedimentation Rate No results for input(s): ESRSEDRATE in the last 72 hours.  C-Reactive Protein No results for input(s): CRP in the last 72 hours.  Microbiology: 10/6 blood cx + 10/4 blood cx +  Imaging: 10/12 chest ct 1. There are numerous bilateral pulmonary nodules throughout the lungs, the largest in the right  pulmonary upper lobe measuring 2.4 x 2.1 cm. There are several larger nodules demonstrating subtle cavitation. Findings are consistent with septic emboli. 2. Small bilateral pleural effusions and associated atelectasis or consolidation. 3. Prominent mediastinal lymph nodes, likely reactive.  10/08 cxr Small bilateral pleural effusion Unchanged diffuse heterogenous nodular and patchy opacity both lungs   10/08 mr thoracolumbar 1. No evidence for osteomyelitis discitis or septic arthritis within the thoracic or lumbar spine. No epidural abscess or other intraspinal infection. 2. Diffuse edema throughout the lower lumbar posterior paraspinous musculature, with similar changes along the posterior margins of the psoas musculature bilaterally. Findings suggestive of acute myositis, likely infectious in nature. No loculated soft tissue collections or abscess. 3. Large irregular bilateral pleural effusions with associated cystic/cavitary changes within the partially visualized lungs, consistent with known history of septic emboli. 4. Shallow left subarticular to foraminal disc protrusion at L5-S1 with resultant mild left foraminal and left lateral recess stenosis.  Tonita Phoenix [facility] Infectious Diseases Cell: [phone] Pager: [phone]  12/23/2019, 9:13 AM

## 2019-12-24 LAB — CBC WITH DIFFERENTIAL/PLATELET
Abs Immature Granulocytes: 0.07 10*3/uL (ref 0.00–0.07)
Basophils Absolute: 0.1 10*3/uL (ref 0.0–0.1)
Basophils Relative: 0 %
Eosinophils Absolute: 0.2 10*3/uL (ref 0.0–0.5)
Eosinophils Relative: 2 %
HCT: 24.3 % — ABNORMAL LOW (ref 39.0–52.0)
Hemoglobin: 7.2 g/dL — ABNORMAL LOW (ref 13.0–17.0)
Immature Granulocytes: 1 %
Lymphocytes Relative: 21 %
Lymphs Abs: 2.8 10*3/uL (ref 0.7–4.0)
MCH: 27.4 pg (ref 26.0–34.0)
MCHC: 29.6 g/dL — ABNORMAL LOW (ref 30.0–36.0)
MCV: 92.4 fL (ref 80.0–100.0)
Monocytes Absolute: 0.5 10*3/uL (ref 0.1–1.0)
Monocytes Relative: 4 %
Neutro Abs: 9.4 10*3/uL — ABNORMAL HIGH (ref 1.7–7.7)
Neutrophils Relative %: 72 %
Platelets: 375 10*3/uL (ref 150–400)
RBC: 2.63 MIL/uL — ABNORMAL LOW (ref 4.22–5.81)
RDW: 18.9 % — ABNORMAL HIGH (ref 11.5–15.5)
WBC: 13 10*3/uL — ABNORMAL HIGH (ref 4.0–10.5)
nRBC: 0 % (ref 0.0–0.2)

## 2019-12-24 LAB — COMPREHENSIVE METABOLIC PANEL
ALT: 30 U/L (ref 0–44)
AST: 17 U/L (ref 15–41)
Albumin: 1.8 g/dL — ABNORMAL LOW (ref 3.5–5.0)
Alkaline Phosphatase: 220 U/L — ABNORMAL HIGH (ref 38–126)
Anion gap: 9 (ref 5–15)
BUN: 22 mg/dL — ABNORMAL HIGH (ref 6–20)
CO2: 23 mmol/L (ref 22–32)
Calcium: 8.3 mg/dL — ABNORMAL LOW (ref 8.9–10.3)
Chloride: 104 mmol/L (ref 98–111)
Creatinine, Ser: 0.97 mg/dL (ref 0.61–1.24)
GFR, Estimated: >60 mL/min (ref >60)
Glucose, Bld: 96 mg/dL (ref 70–99)
Potassium: 4.6 mmol/L (ref 3.5–5.1)
Sodium: 136 mmol/L (ref 135–145)
Total Bilirubin: 0.4 mg/dL (ref 0.3–1.2)
Total Protein: 7 g/dL (ref 6.5–8.1)

## 2019-12-24 LAB — C-REACTIVE PROTEIN: CRP: 8.2 mg/dL — ABNORMAL HIGH (ref <1.0)

## 2019-12-24 LAB — VANCOMYCIN, TROUGH: Vancomycin Tr: 17 ug/mL (ref 15–20)

## 2019-12-24 MED ORDER — HYDROMORPHONE HCL 1 MG/ML IJ SOLN
1.0000 mg | Freq: Four times a day (QID) | INTRAMUSCULAR | Status: DC | PRN
  Administered 2019-12-24 – 2020-01-19 (×98): 1 mg via INTRAVENOUS
  Filled 2019-12-24 (×101): qty 1

## 2019-12-24 MED ORDER — MORPHINE SULFATE ER 15 MG PO TBCR
15.0000 mg | EXTENDED_RELEASE_TABLET | Freq: Two times a day (BID) | ORAL | Status: DC
  Administered 2019-12-24 – 2020-01-19 (×52): 15 mg via ORAL
  Filled 2019-12-24 (×52): qty 1

## 2019-12-24 NOTE — Progress Notes (Addendum)
[facility]    Date of Admission:  12/14/2019   Total days of antibiotics 4           Abx: 10/04-c vancomycin 1gram q12hr; 10/14 trough 17   A/p: 34 y.o. 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  10/04 and 10/06 bcx positive MRSA (vanc mic 0.5); 10/10 first negative but pending final report 10/08 last documented fever Moderate pleural effusion on mri thoracic spine but only small on 10/12 chest ct  CT surgery evaluated not candidate for AngioVac. Future TV valve repair will likely be needed given TV unable to coapt  Other issues: Chronic hep c - will plan to tx outpatient  Hep b serology negative - will need vaccination given risk for hiv/chronic hep b Last hiv screen 12/2018 negative  ----- Labs stable mild leukocytosis Continues to report pain lower back/LE but stable  Rash mentioned elsewhere as of 10/12-13 is not vancomycin related allergy but due to irritant from tape on left forearm    -continue vancomycin per pharmacy -at least 8 weeks abx needed starting 10/10, but a moving target based on repeat imaging -will need repeat mri scan lumbar area in 3-4 week (around first week November) -spaced out labs to q72hours to weekly   Principal Problem:   Endocarditis Active Problems:   Polysubstance dependence (HCC)   IVDU (intravenous drug user)   Acute CHF (congestive heart failure) (HCC)   Anemia   Acute septic pulmonary embolism (HCC)   Goals of care, counseling/discussion   Palliative care encounter   Chronic hepatitis C without hepatic coma  (HCC)   Infective myositis   Pleural effusion, bilateral    Subjective: No n/v/diarrhea No rash Denies sob, chest pain Moderate lower back back improving/stable   Medications:  . (feeding supplement) PROSource Plus  30 mL Oral BID BM  . enoxaparin (LOVENOX) injection  40 mg Subcutaneous Q24H  . famotidine  20 mg Oral Daily  . feeding supplement  237 mL Oral TID BM  . ferrous fumarate-b12-vitamic C-folic acid  1 capsule Oral BID PC  . gabapentin  300 mg Oral TID  . melatonin  5 mg Oral QHS  . morphine  15 mg Oral Q12H  . multivitamin with minerals  1 tablet Oral Daily  . naproxen  500 mg Oral TID WC  . sodium chloride flush  3 mL Intravenous Q12H  . spironolactone  12.5 mg Oral Daily  . traZODone  50 mg Oral QHS    Objective: Vital signs in last 24 hours: Temp:  [98.6 F (37 C)] 98.6 F (37 C) (10/14 0555) Pulse Rate:  [90-96] 90 (10/14 0555) Resp:  [14-18] 14 (10/14 0555) BP: (111-118)/(80-88) 111/80 (10/14 0555) SpO2:  [98 %-99 %] 98 % (10/14 0555) Weight:  [66.5 kg] 66.5 kg (10/14 0555)  Physical Exam  Constitutional: mild distress; conversant; cooperative HEENT: Oropharynx is clear; per; conj clear; eomi CV: RRR, systolic murmur left sternal border Neck = elevated JVP to angle of jaw Pulmonary; normal respiratory effort; clear lungs Abdominal: Soft, nt Neurological: nonfocal Skin: no rash (minor a few small papular squamous lesions  left proximal forearm along site of previous iv tapes, also track mark from skin popping Psychiatric: alert/oriented   Lab Results Recent Labs    12/23/19 0518 12/24/19 0515  WBC 15.7* 13.0*  HGB 7.7* 7.2*  HCT 24.3* 24.3*  NA 135 136  K 4.5 4.6  CL 101 104  CO2 24 23  BUN 27* 22*  CREATININE 1.01 0.97   Liver Panel Recent Labs    12/23/19 0518 12/24/19 0515  PROT 7.3 7.0  ALBUMIN 1.9* 1.8*  AST 23 17  ALT 37 30  ALKPHOS 283* 220*  BILITOT 0.8 0.4   Sedimentation Rate No results for input(s): ESRSEDRATE in  the last 72 hours.  C-Reactive Protein Recent Labs    12/24/19 0515  CRP 8.2*    Microbiology: 10/6 blood cx + 10/4 blood cx +  Imaging: 10/12 chest ct 1. There are numerous bilateral pulmonary nodules throughout the lungs, the largest in the right pulmonary upper lobe measuring 2.4 x 2.1 cm. There are several larger nodules demonstrating subtle cavitation. Findings are consistent with septic emboli. 2. Small bilateral pleural effusions and associated atelectasis or consolidation. 3. Prominent mediastinal lymph nodes, likely reactive.  10/08 cxr Small bilateral pleural effusion Unchanged diffuse heterogenous nodular and patchy opacity both lungs   10/08 mr thoracolumbar 1. No evidence for osteomyelitis discitis or septic arthritis within the thoracic or lumbar spine. No epidural abscess or other intraspinal infection. 2. Diffuse edema throughout the lower lumbar posterior paraspinous musculature, with similar changes along the posterior margins of the psoas musculature bilaterally. Findings suggestive of acute myositis, likely infectious in nature. No loculated soft tissue collections or abscess. 3. Large irregular bilateral pleural effusions with associated cystic/cavitary changes within the partially visualized lungs, consistent with known history of septic emboli. 4. Shallow left subarticular to foraminal disc protrusion at L5-S1 with resultant mild left foraminal and left lateral recess stenosis.  Tonita Phoenix [facility] for Infectious Diseases Cell: [phone] Pager: [phone]  12/24/2019, 10:16 AM

## 2019-12-24 NOTE — Plan of Care (Signed)
  Problem: Health Behavior/Discharge Planning: Goal: Ability to manage health-related needs will improve Outcome: Progressing   Problem: Clinical Measurements: Goal: Ability to maintain clinical measurements within normal limits will improve Outcome: Progressing Goal: Will remain free from infection Outcome: Progressing Goal: Diagnostic test results will improve Outcome: Progressing Goal: Cardiovascular complication will be avoided Outcome: Progressing   Problem: Activity: Goal: Risk for activity intolerance will decrease Outcome: Progressing   Problem: Nutrition: Goal: Adequate nutrition will be maintained Outcome: Progressing   Problem: Coping: Goal: Level of anxiety will decrease Outcome: Progressing   Problem: Pain Managment: Goal: General experience of comfort will improve Outcome: Progressing   Problem: Safety: Goal: Ability to remain free from injury will improve Outcome: Progressing   Problem: Skin Integrity: Goal: Risk for impaired skin integrity will decrease Outcome: Progressing   

## 2019-12-24 NOTE — Progress Notes (Signed)
Pharmacy Antibiotic Note  Alex [name] is a 34 y.o. male admitted on 12/14/2019 with MRSA endocarditis w/ septic emboli (BCx from [facility] where patient recently left AMA, but was admitted to [facility] in Oct 2020 for same). Pharmacy has been consulted for vancomycin dosing.  Day #11 Vanc WBC improved SCr stable Trough 17 mcg/ml > goal 15-20  Plan:  Continue vancomycin to 1g IV q12h   Plan to recheck trough as renal function indicates or every 5-7 days if stable  Monitor clinical course, renal function, cultures as available   Height: 5\' 8"  (172.7 cm) Weight: 66.5 kg (146 lb 8 oz) IBW/kg (Calculated) : 68.4  Temp (24hrs), Avg:98.6 F (37 C), Min:98.6 F (37 C), Max:98.6 F (37 C)  Recent Labs  Lab 12/20/19 0552 12/21/19 0109 12/22/19 0438 12/23/19 0518 12/24/19 0515  WBC 13.3* 13.5* 14.9* 15.7* 13.0*  CREATININE 0.77 0.73 0.79 1.01 0.97  VANCOTROUGH  --  21*  --   --  17    Estimated Creatinine Clearance: 100.9 mL/min (by C-G formula based on SCr of 0.97 mg/dL).    No Known Allergies  10/4 vancomycin >>   Dose adjustments/levels this admission:  10/7 0500 = 9  on vanc 750mg  q12>> inc to 1500 mg IV q12h  10/11 0100 VT = 21 on vanc 1500mg  q12, decr 1g q12h 10/14 0515 VT 17 on vanc 1000mg  IV q12h - continue   Microbiology results: BCx pending from [facility] hospital attempting to get records 10/4 BCx: 3 of 4 MRSA 10/6 BCx: staph aureus in 1 set - MRSA 10/10 repeat BCx: ngtd  Thank you for allowing pharmacy to be a part of this patient's care.  , PharmD Infectious Diseases Pharmacist  Phone: [phone] 12/24/2019 8:26 AM

## 2019-12-24 NOTE — Progress Notes (Signed)
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  Patient admitted with significant bilateral leg swelling, shortness of breath, positional back pain.  Chest x-ray was concerning for septic emboli and possibly superimposed pneumonia.  ED physician discussed with ID who recommended vancomycin for now.    Subjective   Patient seen and examined, complains of pain not controlled with current pain medication regimen.  Also concerned with scrotal swelling.   Assessment/Plan:     1. 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  Blood culture positive on admission, repeat blood cultures on 12/20/2019 so for shows no growth.  CT surgeon Dr. Cliffton Asters was consulted he felt that endocarditis appears to be chronic, patient will not benefit from angiovac.  Patient also not a candidate for tricuspid valve replacement due to ongoing IV drug use.  ID following, recommending 6 to 8 weeks of antibiotics and repeat MRI lumbar spine in 3 to 4 weeks.  We will continue with vancomycin, duration as per ID. 2. Iron deficiency-continue oral iron and vitamin C.  Hemoglobin today is 7.2.  Will transfuse for hemoglobin less than 7. 3. Acute on chronic right ventricular failure-with severe tricuspid regurgitation, hypoalbuminemia with third spacing.  Patient started on Aldactone.  IV Lasix has been discontinued. 4. Polysubstance abuse-UDS positive for amphetamine and opiates. 5. Chronic pain syndrome-patient has  uncontrolled pain, will change Vicodin to MS Contin 15 mg p.o. twice daily.  Change Dilaudid to 1 mg every 6 hours as needed for pain. 6. Scrotal swelling-ultrasound with Doppler was negative for acute abnormality.  Likely third spacing from hypoalbuminemia.  Albumin is 1.8.  Dr. Allena Katz discussed with urology who recommended conservative measures and scrotal support. 7. Hypoalbuminemia-Albumin level is 1.8.  Will obtain nutrition consult for assessment of nutritional status. 8. Hepatitis C infection-not treated, management per ID. 9. Transaminitis-improved with IV Lasix, likely from passive liver congestion.     COVID-19 Labs  Recent Labs    12/24/19 0515  CRP 8.2*    Lab Results  Component Value Date   SARSCOV2NAA NEGATIVE 12/14/2019   SARSCOV2NAA NEGATIVE 08/19/2019   SARSCOV2NAA NEGATIVE 12/28/2018   SARSCOV2NAA NEGATIVE 12/28/2018     Scheduled medications:   . (feeding supplement) PROSource Plus  30 mL Oral BID BM  . enoxaparin (LOVENOX) injection  40 mg Subcutaneous Q24H  . famotidine  20 mg Oral Daily  . feeding supplement  237 mL Oral TID BM  . ferrous fumarate-b12-vitamic C-folic acid  1 capsule Oral BID PC  . gabapentin  300 mg Oral TID  . melatonin  5 mg Oral QHS  . morphine  15 mg Oral Q12H  . multivitamin with minerals  1 tablet Oral Daily  . naproxen  500 mg Oral TID WC  . sodium chloride flush  3 mL Intravenous Q12H  . spironolactone  12.5 mg Oral Daily  . traZODone  50 mg Oral QHS         CBG: No results for input(s): GLUCAP in the last 168 hours.  SpO2: 98 % O2 Flow Rate (L/min): 2 L/min    CBC: Recent  Labs  Lab 12/20/19 0552 12/21/19 0109 12/22/19 0438 12/23/19 0518 12/24/19 0515  WBC 13.3* 13.5* 14.9* 15.7* 13.0*  NEUTROABS  --   --   --  11.1* 9.4*  HGB 7.8* 7.8* 7.8* 7.7* 7.2*  HCT 25.6* 25.8* 25.7* 24.3* 24.3*  MCV 91.1 92.1 90.5 89.0 92.4  PLT 416* 417* 448* 417* 375    Basic Metabolic Panel: Recent Labs  Lab 12/20/19 0552  12/21/19 0109 12/22/19 0438 12/23/19 0518 12/24/19 0515  NA 136 134* 136 135 136  K 4.7 5.0 4.5 4.5 4.6  CL 106 107 103 101 104  CO2 21* 19* 23 24 23   GLUCOSE 92 104* 101* 84 96  BUN 18 17 18  27* 22*  CREATININE 0.77 0.73 0.79 1.01 0.97  CALCIUM 7.6* 7.7* 8.2* 8.2* 8.3*  MG  --  1.9 1.9 1.9  --      Liver Function Tests: Recent Labs  Lab 12/20/19 0552 12/21/19 0109 12/22/19 0438 12/23/19 0518 12/24/19 0515  AST 61* 39 23 23 17   ALT 65* 52* 41 37 30  ALKPHOS 245* 233* 222* 283* 220*  BILITOT 0.9 0.7 0.7 0.8 0.4  PROT 6.2* 6.5 7.0 7.3 7.0  ALBUMIN 1.4* 1.5* 1.8* 1.9* 1.8*     Antibiotics: Anti-infectives (From admission, onward)   Start     Dose/Rate Route Frequency Ordered Stop   12/21/19 1800  vancomycin (VANCOCIN) IVPB 1000 mg/200 mL premix        1,000 mg 200 mL/hr over 60 Minutes Intravenous Every 12 hours 12/21/19 1004     12/17/19 1400  vancomycin (VANCOREADY) IVPB 1500 mg/300 mL  Status:  Discontinued        1,500 mg 150 mL/hr over 120 Minutes Intravenous Every 12 hours 12/17/19 1227 12/21/19 1004   12/14/19 1800  vancomycin (VANCOREADY) IVPB 750 mg/150 mL  Status:  Discontinued        750 mg 150 mL/hr over 60 Minutes Intravenous Every 12 hours 12/14/19 1103 12/17/19 1227   12/14/19 0900  vancomycin (VANCOCIN) IVPB 1000 mg/200 mL premix        1,000 mg 200 mL/hr over 60 Minutes Intravenous  Once 12/14/19 0821 12/14/19 1142   12/14/19 0830  piperacillin-tazobactam (ZOSYN) IVPB 3.375 g  Status:  Discontinued        3.375 g 12.5 mL/hr over 240 Minutes Intravenous  Once 12/14/19 0821 12/14/19 0855       DVT prophylaxis: Lovenox  Code Status: Full code  Family Communication: No family at bedside    Status is: Inpatient  Dispo: The patient is from: Home              Anticipated d/c is to: Home              Anticipated d/c date is: 6 weeks              Patient currently not medically stable for discharge  Barrier to discharge-IV antibiotics for  septic emboli, psoas and number paraspinous myositis          Consultants:  CT surgery  Procedures:     Objective   Vitals:   12/23/19 0404 12/23/19 1234 12/23/19 2033 12/24/19 0555  BP: 113/69 118/80 117/88 111/80  Pulse: 92 96 95 90  Resp: 16  18 14   Temp: 97.9 F (36.6 C) 98.6 F (37 C) 98.6 F (37 C) 98.6 F (37 C)  TempSrc: Oral Oral Oral Oral  SpO2: 100% 98% 99% 98%  Weight: 66.9 kg  66.5 kg  Height:        Intake/Output Summary (Last 24 hours) at 12/24/2019 0932 Last data filed at 12/24/2019 0912 Gross per 24 hour  Intake 240 ml  Output 1675 ml  Net -1435 ml    10/12 1901 - 10/14 0700 In: 2040 [P.O.:1440] Out: 3700 [Urine:3700]  Filed Weights   12/22/19 0548 12/23/19 0404 12/24/19 0555  Weight: 67 kg 66.9 kg 66.5 kg    Physical Examination:   General-appears in no acute distress  Heart-S1-S2, regular  Lungs-clear to auscultation bilaterally, no wheezing or crackles auscultated  Abdomen-soft, nontender, no organomegaly  Extremities-no edema in the lower extremities  Neuro-alert, oriented x3, no focal deficit noted    Data Reviewed:   Recent Results (from the past 240 hour(s))  Urine culture     Status: None   Collection Time: 12/14/19 11:44 AM   Specimen: Urine, Clean Catch  Result Value Ref Range Status   Specimen Description   Final    URINE, CLEAN CATCH Performed at [facility], [address], [place], [place] [address]    Special Requests   Final    NONE Performed at [facility], [address] [place], [place] [address]    Culture   Final    NO GROWTH Performed at [facility], [address], [place], [place] [address]    Report Status 12/15/2019 FINAL  Final  Culture, blood (routine x 2)     Status: Abnormal   Collection Time: 12/16/19 10:48 AM   Specimen: BLOOD  Result Value Ref Range Status   Specimen Description   Final    BLOOD LEFT ARM Performed at [facility], [address] [place], [place] [address]    Special Requests   Final    BOTTLES DRAWN AEROBIC ONLY Blood Culture results may not be optimal due to an inadequate volume of blood received in culture bottles Performed at [facility], [address], [place], [place] [address]    Culture  Setup Time   Final    GRAM POSITIVE COCCI IN CLUSTERS AEROBIC BOTTLE ONLY CRITICAL VALUE NOTED.  VALUE IS CONSISTENT WITH PREVIOUSLY REPORTED AND CALLED VALUE.    Culture (A)  Final    STAPHYLOCOCCUS AUREUS SUSCEPTIBILITIES PERFORMED ON PREVIOUS CULTURE WITHIN THE LAST 5 DAYS. Performed at [facility], [address], [place], [place] [address]    Report Status 12/18/2019 FINAL  Final  Culture, blood (routine x 2)     Status: None   Collection Time: 12/16/19 10:53 AM   Specimen: BLOOD RIGHT HAND  Result Value Ref Range Status   Specimen Description   Final    BLOOD RIGHT HAND Performed at [facility], [address] [place], [place] [address]    Special Requests   Final    BOTTLES DRAWN AEROBIC ONLY Blood Culture adequate volume Performed at [facility], [address] [place], [place] [address]    Culture   Final    NO GROWTH 5 DAYS Performed at [facility], [address] [place], [place] [address]    Report Status 12/21/2019 FINAL  Final  Culture, blood (routine x 2)     Status: None (Preliminary result)   Collection Time: 12/20/19  5:52 AM   Specimen: BLOOD  Result Value Ref Range Status   Specimen Description   Final    BLOOD BLOOD LEFT HAND Performed at [facility], [address] [place], [place] [address]    Special Requests   Final    BOTTLES DRAWN AEROBIC ONLY Blood Culture adequate volume Performed at [facility], [address] [place], [place] [address]    Culture   Final    NO GROWTH 3 DAYS Performed at [facility],  [address] [place], [place] [address]    Report Status PENDING  Incomplete  Culture, blood (routine x 2)     Status: None (Preliminary result)   Collection Time: 12/20/19  6:07 AM   Specimen: BLOOD  Result Value Ref Range Status   Specimen Description   Final    BLOOD BLOOD LEFT HAND Performed at [facility], [address] [place], [place] [address]    Special Requests   Final    BOTTLES DRAWN AEROBIC ONLY Blood Culture adequate volume Performed at [facility], [address] [place], [place] [address]    Culture   Final    NO GROWTH 3 DAYS Performed at [facility], [address], [place], [place] [address]    Report Status PENDING  Incomplete    No results for input(s): LIPASE, AMYLASE in the last 168 hours. No results for input(s): AMMONIA in the last 168 hours.  Cardiac Enzymes: No results for input(s): CKTOTAL, CKMB, CKMBINDEX, TROPONINI in the last 168 hours. BNP (last 3 results) Recent Labs    12/14/19 0810  BNP 526.1*    ProBNP (last 3 results) No results for input(s): PROBNP in the last 8760 hours.  Studies:  CT CHEST WO CONTRAST  Result Date: 12/22/2019 CLINICAL DATA:  Pneumonia, evaluate pleural effusion, septic emboli EXAM: CT CHEST WITHOUT CONTRAST TECHNIQUE: Multidetector CT imaging of the chest was performed following the standard protocol without IV contrast. COMPARISON:  None. FINDINGS: Cardiovascular: No significant vascular findings. Normal heart size. No pericardial effusion. Mediastinum/Nodes: Prominent mediastinal lymph nodes. Thyroid gland, trachea, and esophagus demonstrate no significant findings. Lungs/Pleura: There are numerous bilateral pulmonary nodules throughout the lungs, the largest in the right pulmonary upper lobe measuring 2.4 x 2.1 cm (series 7, image 34). There are several larger nodules demonstrating subtle cavitation (e.g. series 7, image 42). Small bilateral pleural effusions and  associated atelectasis or consolidation. Upper Abdomen: No acute abnormality. Musculoskeletal: No chest wall mass or suspicious bone lesions identified. IMPRESSION: 1. There are numerous bilateral pulmonary nodules throughout the lungs, the largest in the right pulmonary upper lobe measuring 2.4 x 2.1 cm. There are several larger nodules demonstrating subtle cavitation. Findings are consistent with septic emboli. 2. Small bilateral pleural effusions and associated atelectasis or consolidation. 3. Prominent mediastinal lymph nodes, likely reactive. Electronically Signed   By: Lauralyn Primes M.D.   On: 12/22/2019 13:18       Tosca Pletz S Maitland Muhlbauer   [facility] If 7PM-7AM, please contact night-coverage at [email or web address], Office  [phone]   12/24/2019, 9:32 AM  LOS: 10 days

## 2019-12-25 DIAGNOSIS — E440 Moderate protein-calorie malnutrition: Secondary | ICD-10-CM | POA: Insufficient documentation

## 2019-12-25 LAB — CULTURE, BLOOD (ROUTINE X 2)
Culture: NO GROWTH
Culture: NO GROWTH
Special Requests: ADEQUATE
Special Requests: ADEQUATE

## 2019-12-25 MED ORDER — HYDROXYZINE HCL 25 MG PO TABS
25.0000 mg | ORAL_TABLET | Freq: Three times a day (TID) | ORAL | Status: DC
  Administered 2019-12-25 – 2020-01-19 (×73): 25 mg via ORAL
  Filled 2019-12-25 (×73): qty 1

## 2019-12-25 MED ORDER — GABAPENTIN 400 MG PO CAPS
400.0000 mg | ORAL_CAPSULE | Freq: Three times a day (TID) | ORAL | Status: DC
  Administered 2019-12-25 – 2020-01-10 (×49): 400 mg via ORAL
  Filled 2019-12-25 (×49): qty 1

## 2019-12-25 MED ORDER — VANCOMYCIN HCL IN DEXTROSE 1-5 GM/200ML-% IV SOLN
1000.0000 mg | Freq: Two times a day (BID) | INTRAVENOUS | Status: DC
  Administered 2019-12-25 – 2020-01-03 (×19): 1000 mg via INTRAVENOUS
  Filled 2019-12-25 (×15): qty 200

## 2019-12-25 NOTE — Progress Notes (Signed)
Pt c/o rash on bilateral arms.  MD made aware and came to assess, MD feels the rash is related to tape.  Hydroxyzine ordered.  Will replace tape on patient IV to see if that helps decrease itching.  Will continue to monitor closely.

## 2019-12-25 NOTE — Progress Notes (Signed)
Physical Therapy Treatment Patient Details Name: Alex [name] MRN: [id number] DOB: 1985-04-21 Today's Date: 12/25/2019    History of Present Illness 34 yo male admitted with endocarditis, back pain. Imaging (+) paraspinal myositis. Hx of asthma, polysubstance abuse, ADHD, asthma    PT Comments    Pt continues to move well. He is independent with all mobility and able to walk hallway distances, some dyspnea, sats 100%  on RA.  HR max 110. Discussed pt possibly being off bed alarm so he can mobilize more and incr his activity tol. Pt is very agreeable to this if ok with nursing staff.  No further PT needs. Will sign off   Follow Up Recommendations  No PT follow up     Equipment Recommendations  None recommended by PT    Recommendations for Other Services       Precautions / Restrictions Precautions Precautions: None    Mobility  Bed Mobility Overal bed mobility: Independent                Transfers Overall transfer level: Independent                  Ambulation/Gait Ambulation/Gait assistance: Independent Gait Distance (Feet): 120 Feet Assistive device: None;IV Pole Gait Pattern/deviations: Step-through pattern;Decreased stride length     General Gait Details: pt reports 2-3/4 DOE, SpO2=100% on RA, HR max 110. no LOB or apparent balance issues noted    Stairs             Wheelchair Mobility    Modified Rankin (Stroke Patients Only)       Balance             Standing balance-Leahy Scale: Good                              Cognition Arousal/Alertness: Awake/alert Behavior During Therapy: WFL for tasks assessed/performed Overall Cognitive Status: Within Functional Limits for tasks assessed                                 General Comments: pleasant and cooperative. pt would l ike to be allowed off bed alarm so he could mobilize more. RN made aware       Exercises      General Comments         Pertinent Vitals/Pain Pain Assessment: No/denies pain    Home Living                      Prior Function            PT Goals (current goals can now be found in the care plan section) Acute Rehab PT Goals Patient Stated Goal: to feel better PT Goal Formulation: With patient Time For Goal Achievement: 01/04/20 Potential to Achieve Goals: Good Progress towards PT goals: Goals met/education completed, patient discharged from PT    Frequency    Min 2X/week      PT Plan Other (comment) (d/c PT )    Co-evaluation              AM-PAC PT "6 Clicks" Mobility   Outcome Measure  Help needed turning from your back to your side while in a flat bed without using bedrails?: None Help needed moving from lying on your back to sitting on the side of a flat bed without using bedrails?: None  Help needed moving to and from a bed to a chair (including a wheelchair)?: None Help needed standing up from a chair using your arms (e.g., wheelchair or bedside chair)?: None Help needed to walk in hospital room?: None Help needed climbing 3-5 steps with a railing? : None 6 Click Score: 24    End of Session Equipment Utilized During Treatment: Gait belt Activity Tolerance: Patient tolerated treatment well Patient left: in bed;with call bell/phone within reach;with bed alarm set Nurse Communication: Mobility status PT Visit Diagnosis: Unsteadiness on feet (R26.81)     Time: 1062-6948 PT Time Calculation (min) (ACUTE ONLY): 18 min  Charges:  $Gait Training: 8-22 mins                     Baxter Flattery, PT  Acute Rehab Dept ([place]) [phone] Pager [phone]  12/25/2019    [facility] 12/25/2019, 1:13 PM

## 2019-12-25 NOTE — Progress Notes (Signed)
Initial Nutrition Assessment  DOCUMENTATION CODES:   Non-severe (moderate) malnutrition in context of social or environmental circumstances  INTERVENTION:   -Ensure Enlive po TID, each supplement provides 350 kcal and 20 grams of protein -Magic cup BID with meals, each supplement provides 290 kcal and 9 grams of protein  NUTRITION DIAGNOSIS:   Moderate Malnutrition related to social / environmental circumstances (polysubstance abuse, homelessness) as evidenced by mild fat depletion, moderate muscle depletion, energy intake < 75% for > or equal to 3 months.  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  Patient admitted with significant bilateral leg swelling, shortness of breath, positional back pain.  Chest x-ray was concerning for septic emboli and possibly superimposed pneumonia.  Patient in room, very sleepy. Pt states his appetite is good. He states that given his substance abuse, he's never been able to gain his weight back. States he used to weigh ~160 lbs before using. Pt currently consuming 50-75% of meals. Did not eat his "hot" food items as they arrived cold this morning. Ate cereal with milk.  Pt likes Ensure and is drinking these with no issue. Does not like Prosource but is drinking them if he has to. Will d/c and order Magic Cups with meals instead as pt likes ice cream.  Pt states he has been homeless so he has had issues with getting food consistently.  Reviewed high protein foods with pt and foods  that he can eat that don't require refrigeration.   Per chart review, pt expected to have a prolonged admission given need of IV antibiotics.  Admission weight: 119 lbs. Current weight: 146 lbs.  Weight increase is likely r/t fluid.  I/Os: -14.8L since admit UOP: 1450 ml x 24 hrs  Medications: FOLTRIN (B-12, iron, Vit C, folic acid), Multivitamin with minerals daily  Labs reviewed. UDS+ for opiates and amphetamines at admission.  NUTRITION - FOCUSED PHYSICAL EXAM:    Most Recent Value  Orbital Region Mild depletion  Upper Arm Region Moderate depletion  Thoracic and Lumbar Region Unable to assess  Buccal Region Moderate depletion  Temple Region Moderate depletion  Clavicle Bone Region Severe depletion  Clavicle and Acromion Bone Region Moderate depletion  Scapular Bone Region Moderate depletion  Dorsal Hand Mild depletion  Patellar Region Unable to assess  Anterior Thigh Region Unable to assess  Posterior Calf Region Unable to assess  Edema (RD Assessment) None       Diet Order:   Diet Order            Diet regular Room service appropriate? Yes; Fluid consistency: Thin  Diet effective now                 EDUCATION NEEDS:   Education needs have been addressed  Skin:  Skin Assessment: Reviewed RN Assessment  Last BM:  10/14  Height:   Ht Readings from Last 1 Encounters:  12/18/19 5\' 8"  (1.727 m)    Weight:   Wt Readings from Last 1 Encounters:  12/25/19 66.4 kg    BMI:  Body mass index is  22.26 kg/m.  Estimated Nutritional Needs:   Kcal:  1950-2150  Protein:  105-115g  Fluid:  2L/day   Tilda Franco, MS, RD, LDN Inpatient Clinical Dietitian Contact information available via Amion

## 2019-12-25 NOTE — Progress Notes (Signed)
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  Alex [name] admitted with significant bilateral leg swelling, shortness of breath, positional back pain.  Chest x-ray was concerning for septic emboli and possibly superimposed pneumonia.  ED physician discussed with ID who recommended vancomycin for now.    Subjective   Alex [name] seen and examined, pain better controlled with change in regimen.  He was started on MS Contin 15 mg p.o. twice daily.  Dilaudid was changed to 1 mg every 6 hours as needed.   Assessment/Plan:     1. 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  Blood culture positive on admission, repeat blood cultures on 12/20/2019 so for shows no growth.  CT surgeon Dr. Cliffton AstersLightfoot was consulted he felt that endocarditis appears to be chronic, Alex [name] will not benefit from angiovac.  Alex [name] also not a candidate for tricuspid valve replacement due to ongoing IV drug use.  ID following, recommending 6 to 8 weeks of antibiotics and repeat MRI lumbar spine in 3 to 4 weeks.  We will continue with vancomycin, duration as per ID. 2. Iron deficiency-continue oral iron and vitamin C.  Hemoglobin today is 7.2.  Will transfuse for hemoglobin less than 7. 3. Acute on chronic right ventricular failure-with severe tricuspid regurgitation, hypoalbuminemia with third spacing.  Alex [name] started on Aldactone.  IV Lasix has been discontinued. 4. Polysubstance abuse-UDS positive for amphetamine and  opiates. 5. Chronic pain syndrome-pain is better controlled after starting MS Contin 15 mg p.o. twice daily.  Continue Dilaudid  1 mg every 6 hours as needed for pain. 6. Scrotal swelling-ultrasound with Doppler was negative for acute abnormality.  Likely third spacing from hypoalbuminemia.  Albumin is 1.8.  Dr. Allena KatzPatel discussed with urology who recommended conservative measures and scrotal support. 7. Hypoalbuminemia-Albumin level is 1.8.  Dietitian has been consulted.  Will follow recommendations. 8. Hepatitis C infection-not treated, management per ID. 9. Transaminitis-improved with IV Lasix, likely from passive liver congestion.     COVID-19 Labs  Recent Labs    12/24/19 0515  CRP 8.2*    Lab Results  Component Value Date   SARSCOV2NAA NEGATIVE 12/14/2019   SARSCOV2NAA NEGATIVE 08/19/2019   SARSCOV2NAA NEGATIVE 12/28/2018   SARSCOV2NAA NEGATIVE 12/28/2018     Scheduled medications:   . enoxaparin (LOVENOX) injection  40 mg Subcutaneous Q24H  . famotidine  20 mg Oral Daily  . feeding supplement  237 mL Oral TID BM  . ferrous fumarate-b12-vitamic C-folic acid  1 capsule Oral BID PC  . gabapentin  400 mg Oral TID  . melatonin  5 mg Oral QHS  . morphine  15 mg Oral Q12H  . multivitamin with minerals  1 tablet Oral Daily  . naproxen  500 mg Oral TID WC  . sodium chloride flush  3 mL Intravenous Q12H  . spironolactone  12.5 mg Oral Daily  . traZODone  50 mg Oral QHS         CBG: No results for input(s): GLUCAP in the last 168 hours.  SpO2: 98 % O2 Flow Rate (L/min): 2 L/min    CBC: Recent  Labs  Lab 12/20/19 0552 12/21/19 0109 12/22/19 0438 12/23/19 0518 12/24/19 0515  WBC 13.3* 13.5* 14.9* 15.7* 13.0*  NEUTROABS  --   --   --  11.1* 9.4*  HGB 7.8* 7.8* 7.8* 7.7* 7.2*  HCT 25.6* 25.8* 25.7* 24.3* 24.3*  MCV 91.1 92.1 90.5 89.0 92.4  PLT 416* 417* 448* 417* 375    Basic Metabolic Panel: Recent Labs  Lab 12/20/19 0552 12/21/19 0109 12/22/19 0438  12/23/19 0518 12/24/19 0515  NA 136 134* 136 135 136  K 4.7 5.0 4.5 4.5 4.6  CL 106 107 103 101 104  CO2 21* 19* 23 24 23   GLUCOSE 92 104* 101* 84 96  BUN 18 17 18  27* 22*  CREATININE 0.77 0.73 0.79 1.01 0.97  CALCIUM 7.6* 7.7* 8.2* 8.2* 8.3*  MG  --  1.9 1.9 1.9  --      Liver Function Tests: Recent Labs  Lab 12/20/19 0552 12/21/19 0109 12/22/19 0438 12/23/19 0518 12/24/19 0515  AST 61* 39 23 23 17   ALT 65* 52* 41 37 30  ALKPHOS 245* 233* 222* 283* 220*  BILITOT 0.9 0.7 0.7 0.8 0.4  PROT 6.2* 6.5 7.0 7.3 7.0  ALBUMIN 1.4* 1.5* 1.8* 1.9* 1.8*     Antibiotics: Anti-infectives (From admission, onward)   Start     Dose/Rate Route Frequency Ordered Stop   12/21/19 1800  vancomycin (VANCOCIN) IVPB 1000 mg/200 mL premix        1,000 mg 200 mL/hr over 60 Minutes Intravenous Every 12 hours 12/21/19 1004     12/17/19 1400  vancomycin (VANCOREADY) IVPB 1500 mg/300 mL  Status:  Discontinued        1,500 mg 150 mL/hr over 120 Minutes Intravenous Every 12 hours 12/17/19 1227 12/21/19 1004   12/14/19 1800  vancomycin (VANCOREADY) IVPB 750 mg/150 mL  Status:  Discontinued        750 mg 150 mL/hr over 60 Minutes Intravenous Every 12 hours 12/14/19 1103 12/17/19 1227   12/14/19 0900  vancomycin (VANCOCIN) IVPB 1000 mg/200 mL premix        1,000 mg 200 mL/hr over 60 Minutes Intravenous  Once 12/14/19 0821 12/14/19 1142   12/14/19 0830  piperacillin-tazobactam (ZOSYN) IVPB 3.375 g  Status:  Discontinued        3.375 g 12.5 mL/hr over 240 Minutes Intravenous  Once 12/14/19 0821 12/14/19 0855       DVT prophylaxis: Lovenox  Code Status: Full code  Family Communication: No family at bedside    Status is: Inpatient  Dispo: The Alex [name] is from: Home              Anticipated d/c is to: Home              Anticipated d/c date is: 6 weeks              Alex [name] currently not medically stable for discharge  Barrier to discharge-IV antibiotics for septic emboli, psoas and number  paraspinous myositis      Consultants:  CT surgery  Procedures:     Objective   Vitals:   12/24/19 1239 12/24/19 2136 12/25/19 0619 12/25/19 1401  BP: 111/82 (!) 125/93 (!) 111/91 118/87  Pulse: 94 88 88 96  Resp: 18 16 18 18   Temp: 98.1 F (36.7 C) 97.7 F (36.5 C) 98.9 F (37.2 C)   TempSrc:      SpO2: 97% 98% 99% 98%  Weight:   66.4 kg   Height:  Intake/Output Summary (Last 24 hours) at 12/25/2019 1558 Last data filed at 12/25/2019 1250 Gross per 24 hour  Intake --  Output 2600 ml  Net -2600 ml    10/13 1901 - 10/15 0700 In: 480 [P.O.:480] Out: 3725 [Urine:3725]  Filed Weights   12/23/19 0404 12/24/19 0555 12/25/19 0619  Weight: 66.9 kg 66.5 kg 66.4 kg    Physical Examination:  General-appears in no acute distress Heart-S1-S2, regular, no murmur auscultated Lungs-clear to auscultation bilaterally, no wheezing or crackles auscultated Abdomen-soft, nontender, no organomegaly Extremities-no edema in the lower extremities Neuro-alert, oriented x3, no focal deficit noted   Data Reviewed:   Recent Results (from the past 240 hour(s))  Culture, blood (routine x 2)     Status: Abnormal   Collection Time: 12/16/19 10:48 AM   Specimen: BLOOD  Result Value Ref Range Status   Specimen Description   Final    BLOOD LEFT ARM Performed at [facility], [address], [place], [place] [address]    Special Requests   Final    BOTTLES DRAWN AEROBIC ONLY Blood Culture results may not be optimal due to an inadequate volume of blood received in culture bottles Performed at [facility], [address] [place], [place] [address]    Culture  Setup Time   Final    GRAM POSITIVE COCCI IN CLUSTERS AEROBIC BOTTLE ONLY CRITICAL VALUE NOTED.  VALUE IS CONSISTENT WITH PREVIOUSLY REPORTED AND CALLED VALUE.    Culture (A)  Final    STAPHYLOCOCCUS AUREUS SUSCEPTIBILITIES PERFORMED ON PREVIOUS CULTURE WITHIN THE LAST 5  DAYS. Performed at [facility], [address], [place], [place] [address]    Report Status 12/18/2019 FINAL  Final  Culture, blood (routine x 2)     Status: None   Collection Time: 12/16/19 10:53 AM   Specimen: BLOOD RIGHT HAND  Result Value Ref Range Status   Specimen Description   Final    BLOOD RIGHT HAND Performed at [facility], [address], [place], [place] [address]    Special Requests   Final    BOTTLES DRAWN AEROBIC ONLY Blood Culture adequate volume Performed at [facility], [address], [place], [place] [address]    Culture   Final    NO GROWTH 5 DAYS Performed at [facility], [address], [place], [place] [address]    Report Status 12/21/2019 FINAL  Final  Culture, blood (routine x 2)     Status: None   Collection Time: 12/20/19  5:52 AM   Specimen: BLOOD  Result Value Ref Range Status   Specimen Description   Final    BLOOD BLOOD LEFT HAND Performed at [facility], [address] [place], [place] [address]    Special Requests   Final    BOTTLES DRAWN AEROBIC ONLY Blood Culture adequate volume Performed at [facility], [address], [place], [place] [address]    Culture   Final    NO GROWTH 5 DAYS Performed at [facility], [address] [place], [place] [address]    Report Status 12/25/2019 FINAL  Final  Culture, blood (routine x 2)     Status: None   Collection Time: 12/20/19  6:07 AM   Specimen: BLOOD  Result Value Ref Range Status   Specimen Description   Final    BLOOD BLOOD LEFT HAND Performed at [facility], [address] [place], [place] [address]    Special Requests   Final    BOTTLES DRAWN AEROBIC ONLY Blood Culture adequate volume Performed at [facility], [address] [place], [place] [address]    Culture   Final    NO GROWTH 5 DAYS Performed at [facility], [address] [place], [place] [address]    Report Status 12/25/2019 FINAL  Final    No results for input(s): LIPASE, AMYLASE in the last 168 hours. No results for input(s): AMMONIA in the last 168 hours.  Cardiac Enzymes: No results for input(s): CKTOTAL, CKMB, CKMBINDEX, TROPONINI in the last 168 hours. BNP (last 3 results) Recent Labs    12/14/19 0810  BNP 526.1*    ProBNP (last 3 results) No results for input(s): PROBNP in the last 8760 hours.  Studies:  No results found.     Meredeth Ide   [facility] If 7PM-7AM, please contact night-coverage at [email or web address], Office  [phone]   12/25/2019, 3:58 PM  LOS: 11 days

## 2019-12-26 NOTE — Progress Notes (Signed)
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 on 12/08/2019, left AMA on 12/09/2019.  Patient admitted with significant bilateral leg swelling, shortness of breath, positional back pain.  Chest x-ray was concerning for septic emboli and possibly superimposed pneumonia.  ED physician discussed with ID who recommended vancomycin for now.    Subjective   Patient seen and examined, denies any complaints.  Bilateral upper extremity rash has improved after starting hydroxyzine.   Assessment/Plan:     1. 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  Blood culture positive on admission, repeat blood cultures on 12/20/2019 so for shows no growth.  CT surgeon Dr. Cliffton Asters was consulted he felt that endocarditis appears to be chronic, patient will not benefit from angiovac.  Patient also not a candidate for tricuspid valve replacement due to ongoing IV drug use.  ID following, recommending 6 to 8 weeks of antibiotics and repeat MRI lumbar spine in 3 to 4 weeks.  We will continue with vancomycin, duration as per ID. 2. Bilateral upper extremity rash-patient developed bilateral extremity rash at the site of IV.  Also complained of itching and has been scratching.  Started on hydroxyzine yesterday and has significantly improved.  IV tape also has been changed to nonlatex. 3. Iron deficiency-continue oral iron and vitamin C.  Hemoglobin today is 7.2.  Will transfuse for hemoglobin less than 7. 4. Acute on chronic right ventricular  failure-with severe tricuspid regurgitation, hypoalbuminemia with third spacing.  Patient started on Aldactone.  IV Lasix has been discontinued. 5. Polysubstance abuse-UDS positive for amphetamine and opiates. 6. Chronic pain syndrome-pain is better controlled after starting MS Contin 15 mg p.o. twice daily.  Continue Dilaudid  1 mg every 6 hours as needed for pain. 7. Scrotal swelling-ultrasound with Doppler was negative for acute abnormality.  Likely third spacing from hypoalbuminemia.  Albumin is 1.8.  Dr. Allena Katz discussed with urology who recommended conservative measures and scrotal support.  We will start low-dose Lasix 20 mg daily. 8. Hypoalbuminemia-Albumin level is 1.8.  Dietitian has been consulted.  Patient started on protein supplementation. 9. Hepatitis C infection-not treated, management per ID. 10. Transaminitis-improved with IV Lasix, likely from passive liver congestion.     COVID-19 Labs  Recent Labs    12/24/19 0515  CRP 8.2*    Lab Results  Component Value Date   SARSCOV2NAA NEGATIVE 12/14/2019   SARSCOV2NAA NEGATIVE 08/19/2019   SARSCOV2NAA NEGATIVE 12/28/2018   SARSCOV2NAA NEGATIVE 12/28/2018     Scheduled medications:   . enoxaparin (LOVENOX) injection  40 mg Subcutaneous Q24H  . famotidine  20 mg Oral Daily  . feeding supplement  237 mL Oral TID BM  . ferrous fumarate-b12-vitamic C-folic acid  1 capsule Oral BID PC  . gabapentin  400 mg Oral TID  . hydrOXYzine  25 mg Oral TID  . morphine  15 mg Oral Q12H  . multivitamin with minerals  1 tablet Oral Daily  . naproxen  500 mg Oral TID WC  . sodium chloride flush  3 mL Intravenous Q12H  . spironolactone  12.5 mg Oral Daily  . traZODone  50 mg Oral QHS  CBG: No results for input(s): GLUCAP in the last 168 hours.  SpO2: 97 % O2 Flow Rate (L/min): 2 L/min    CBC: Recent Labs  Lab 12/20/19 0552 12/21/19 0109 12/22/19 0438 12/23/19 0518 12/24/19 0515  WBC 13.3* 13.5* 14.9* 15.7*  13.0*  NEUTROABS  --   --   --  11.1* 9.4*  HGB 7.8* 7.8* 7.8* 7.7* 7.2*  HCT 25.6* 25.8* 25.7* 24.3* 24.3*  MCV 91.1 92.1 90.5 89.0 92.4  PLT 416* 417* 448* 417* 375    Basic Metabolic Panel: Recent Labs  Lab 12/20/19 0552 12/21/19 0109 12/22/19 0438 12/23/19 0518 12/24/19 0515  NA 136 134* 136 135 136  K 4.7 5.0 4.5 4.5 4.6  CL 106 107 103 101 104  CO2 21* 19* 23 24 23   GLUCOSE 92 104* 101* 84 96  BUN 18 17 18  27* 22*  CREATININE 0.77 0.73 0.79 1.01 0.97  CALCIUM 7.6* 7.7* 8.2* 8.2* 8.3*  MG  --  1.9 1.9 1.9  --      Liver Function Tests: Recent Labs  Lab 12/20/19 0552 12/21/19 0109 12/22/19 0438 12/23/19 0518 12/24/19 0515  AST 61* 39 23 23 17   ALT 65* 52* 41 37 30  ALKPHOS 245* 233* 222* 283* 220*  BILITOT 0.9 0.7 0.7 0.8 0.4  PROT 6.2* 6.5 7.0 7.3 7.0  ALBUMIN 1.4* 1.5* 1.8* 1.9* 1.8*     Antibiotics: Anti-infectives (From admission, onward)   Start     Dose/Rate Route Frequency Ordered Stop   12/25/19 1815  vancomycin (VANCOCIN) IVPB 1000 mg/200 mL premix        1,000 mg 100 mL/hr over 120 Minutes Intravenous Every 12 hours 12/25/19 1804     12/21/19 1800  vancomycin (VANCOCIN) IVPB 1000 mg/200 mL premix  Status:  Discontinued        1,000 mg 200 mL/hr over 60 Minutes Intravenous Every 12 hours 12/21/19 1004 12/25/19 1804   12/17/19 1400  vancomycin (VANCOREADY) IVPB 1500 mg/300 mL  Status:  Discontinued        1,500 mg 150 mL/hr over 120 Minutes Intravenous Every 12 hours 12/17/19 1227 12/21/19 1004   12/14/19 1800  vancomycin (VANCOREADY) IVPB 750 mg/150 mL  Status:  Discontinued        750 mg 150 mL/hr over 60 Minutes Intravenous Every 12 hours 12/14/19 1103 12/17/19 1227   12/14/19 0900  vancomycin (VANCOCIN) IVPB 1000 mg/200 mL premix        1,000 mg 200 mL/hr over 60 Minutes Intravenous  Once 12/14/19 0821 12/14/19 1142   12/14/19 0830  piperacillin-tazobactam (ZOSYN) IVPB 3.375 g  Status:  Discontinued        3.375 g 12.5 mL/hr over 240  Minutes Intravenous  Once 12/14/19 0821 12/14/19 0855       DVT prophylaxis: Lovenox  Code Status: Full code  Family Communication: No family at bedside    Status is: Inpatient  Dispo: The patient is from: Home              Anticipated d/c is to: Home              Anticipated d/c date is: 6 weeks              Patient currently not medically stable for discharge  Barrier to discharge-IV antibiotics for septic emboli, psoas and number paraspinous myositis      Consultants:  CT surgery  Procedures:     Objective   Vitals:   12/25/19  1401 12/25/19 2032 12/26/19 0516 12/26/19 1334  BP: 118/87 118/90 (!) 123/97 (!) 122/97  Pulse: 96 94 (!) 103 93  Resp: 18 16 16 16   Temp:  98 F (36.7 C) 98.4 F (36.9 C) 98.5 F (36.9 C)  TempSrc:    Oral  SpO2: 98% 97% 96% 97%  Weight:      Height:        Intake/Output Summary (Last 24 hours) at 12/26/2019 1732 Last data filed at 12/25/2019 1800 Gross per 24 hour  Intake 40 ml  Output --  Net 40 ml    10/14 1901 - 10/16 0700 In: 726.5 [P.O.:480; I.V.:46.5] Out: 2200 [Urine:2200]  Filed Weights   12/23/19 0404 12/24/19 0555 12/25/19 0619  Weight: 66.9 kg 66.5 kg 66.4 kg    Physical Examination:  General-appears in no acute distress Heart-S1-S2, regular, no murmur auscultated Lungs-clear to auscultation bilaterally, no wheezing or crackles auscultated Abdomen-soft, nontender, no organomegaly Extremities-no edema in the lower extremities, nonerythematous maculopapular lesion noted on upper extremities Neuro-alert, oriented x3, no focal deficit noted  Data Reviewed:   Recent Results (from the past 240 hour(s))  Culture, blood (routine x 2)     Status: None   Collection Time: 12/20/19  5:52 AM   Specimen: BLOOD  Result Value Ref Range Status   Specimen Description   Final    BLOOD BLOOD LEFT HAND Performed at [facility], [address] [place], [place]    Special Requests    Final    BOTTLES DRAWN AEROBIC ONLY Blood Culture adequate volume Performed at [facility], [address], [place], [place]    Culture   Final    NO GROWTH 5 DAYS Performed at [facility], [address], [place], [place]    Report Status 12/25/2019 FINAL  Final  Culture, blood (routine x 2)     Status: None   Collection Time: 12/20/19  6:07 AM   Specimen: BLOOD  Result Value Ref Range Status   Specimen Description   Final    BLOOD BLOOD LEFT HAND Performed at [facility], [address], [place], [place]    Special Requests   Final    BOTTLES DRAWN AEROBIC ONLY Blood Culture adequate volume Performed at [facility], [address] [place], [place]    Culture   Final    NO GROWTH 5 DAYS Performed at [facility], [address] [place], [place]    Report Status 12/25/2019 FINAL  Final    No results for input(s): LIPASE, AMYLASE in the last 168 hours. No results for input(s): AMMONIA in the last 168 hours.  Cardiac Enzymes: No results for input(s): CKTOTAL, CKMB, CKMBINDEX, TROPONINI in the last 168 hours. BNP (last 3 results) Recent Labs    12/14/19 0810  BNP 526.1*    ProBNP (last 3 results) No results for input(s): PROBNP in the last 8760 hours.  Studies:  No results found.     02/13/20   [facility] If 7PM-7AM, please contact night-coverage at [email or web address], Office  [phone]   12/26/2019, 5:32 PM  LOS: 12 days

## 2019-12-27 LAB — CBC WITH DIFFERENTIAL/PLATELET
Abs Immature Granulocytes: 0.09 10*3/uL — ABNORMAL HIGH (ref 0.00–0.07)
Basophils Absolute: 0.1 10*3/uL (ref 0.0–0.1)
Basophils Relative: 0 %
Eosinophils Absolute: 0.2 10*3/uL (ref 0.0–0.5)
Eosinophils Relative: 1 %
HCT: 27.1 % — ABNORMAL LOW (ref 39.0–52.0)
Hemoglobin: 8.3 g/dL — ABNORMAL LOW (ref 13.0–17.0)
Immature Granulocytes: 1 %
Lymphocytes Relative: 12 %
Lymphs Abs: 2.1 10*3/uL (ref 0.7–4.0)
MCH: 27.9 pg (ref 26.0–34.0)
MCHC: 30.6 g/dL (ref 30.0–36.0)
MCV: 90.9 fL (ref 80.0–100.0)
Monocytes Absolute: 0.8 10*3/uL (ref 0.1–1.0)
Monocytes Relative: 4 %
Neutro Abs: 14.6 10*3/uL — ABNORMAL HIGH (ref 1.7–7.7)
Neutrophils Relative %: 82 %
Platelets: 338 10*3/uL (ref 150–400)
RBC: 2.98 MIL/uL — ABNORMAL LOW (ref 4.22–5.81)
RDW: 18.3 % — ABNORMAL HIGH (ref 11.5–15.5)
WBC: 17.9 10*3/uL — ABNORMAL HIGH (ref 4.0–10.5)
nRBC: 0 % (ref 0.0–0.2)

## 2019-12-27 LAB — C-REACTIVE PROTEIN: CRP: 9.6 mg/dL — ABNORMAL HIGH (ref <1.0)

## 2019-12-27 NOTE — Progress Notes (Signed)
Pharmacy Antibiotic Note  Alex [name] is a 34 y.o. male admitted on 12/14/2019 with MRSA endocarditis w/ septic emboli (BCx from [place] where patient recently left AMA, but was admitted to [facility] in Oct 2020 for same). Pharmacy has been consulted for vancomycin dosing.  Day #14 Vanc WBC overall improved SCr stable Trough 17 mcg/ml on 10/14, at goal 15-20  Plan:  Continue vancomycin to 1g IV q12h   Plan to recheck trough as renal function indicates or every 5-7 days if stable  Monitor clinical course, renal function, cultures as available   Height: 5\' 8"  (172.7 cm) Weight: 66.4 kg (146 lb 6.2 oz) IBW/kg (Calculated) : 68.4  Temp (24hrs), Avg:98.3 F (36.8 C), Min:98 F (36.7 C), Max:98.5 F (36.9 C)  Recent Labs  Lab 12/21/19 0109 12/22/19 0438 12/23/19 0518 12/24/19 0515 12/27/19 0902  WBC 13.5* 14.9* 15.7* 13.0* 17.9*  CREATININE 0.73 0.79 1.01 0.97  --   VANCOTROUGH 21*  --   --  17  --     Estimated Creatinine Clearance: 100.8 mL/min (by C-G formula based on SCr of 0.97 mg/dL).    No Known Allergies  10/4 vancomycin >>   Dose adjustments/levels this admission:  10/7 0500 = 9  on vanc 750mg  q12>> inc to 1500 mg IV q12h  10/11 0100 VT = 21 on vanc 1500mg  q12, decr 1g q12h 10/14 0515 VT 17 on vanc 1000mg  IV q12h - continue   Microbiology results: BCx pending from [facility] hospital attempting to get records 10/4 BCx: 3 of 4 MRSA 10/6 BCx: staph aureus in 1 set - MRSA 10/10 repeat BCx: NGF  Thank you for allowing pharmacy to be a part of this patient's care.  , PharmD, BCPS Pharmacy: [phone] 12/27/2019 11:37 AM

## 2019-12-27 NOTE — Plan of Care (Signed)
  Problem: Health Behavior/Discharge Planning: Goal: Ability to manage health-related needs will improve Outcome: Progressing   Problem: Clinical Measurements: Goal: Will remain free from infection Outcome: Progressing Goal: Cardiovascular complication will be avoided Outcome: Progressing   Problem: Activity: Goal: Risk for activity intolerance will decrease Outcome: Progressing   Problem: Nutrition: Goal: Adequate nutrition will be maintained Outcome: Progressing   Problem: Pain Managment: Goal: General experience of comfort will improve Outcome: Not Progressing

## 2019-12-27 NOTE — Progress Notes (Signed)
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  Patient admitted with significant bilateral leg swelling, shortness of breath, positional back pain.  Chest x-ray was concerning for septic emboli and possibly superimposed pneumonia.  ED physician discussed with ID who recommended vancomycin for now.    Subjective   Patient seen and examined, denies any complaints.  Rash is significantly improved.   Assessment/Plan:     1. 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  Blood culture positive on admission, repeat blood cultures on 12/20/2019 so for shows no growth.  CT surgeon Dr. Cliffton Asters was consulted he felt that endocarditis appears to be chronic, patient will not benefit from angiovac.  Patient also not a candidate for tricuspid valve replacement due to ongoing IV drug use.  ID following, recommending 6 to 8 weeks of antibiotics and repeat MRI lumbar spine in 3 to 4 weeks.  We will continue with vancomycin, duration as per ID. 2. Bilateral upper extremity rash-resolved, patient developed bilateral extremity rash at the site of IV.  Also complained of itching and has been scratching.  Started on hydroxyzine yesterday and has significantly improved.  IV tape also has been changed to nonlatex. 3. Iron deficiency-continue oral iron and vitamin C.  Hemoglobin today is 7.2.  Will transfuse for hemoglobin less than 7. 4. Acute on chronic right ventricular failure-with severe tricuspid  regurgitation, hypoalbuminemia with third spacing.  Patient started on Aldactone.  IV Lasix has been discontinued. 5. Polysubstance abuse-UDS positive for amphetamine and opiates. 6. Chronic pain syndrome-pain is better controlled after starting MS Contin 15 mg p.o. twice daily.  Continue Dilaudid  1 mg every 6 hours as needed for pain. 7. Scrotal swelling-ultrasound with Doppler was negative for acute abnormality.  Likely third spacing from hypoalbuminemia.  Albumin is 1.8.  Dr. Allena Katz discussed with urology who recommended conservative measures and scrotal support.  We will start low-dose Lasix 20 mg daily. 8. Hypoalbuminemia-Albumin level is 1.8.  Dietitian has been consulted.  Patient started on protein supplementation. 9. Hepatitis C infection-not treated, management per ID. 10. Transaminitis-improved with IV Lasix, likely from passive liver congestion.     COVID-19 Labs  Recent Labs    12/27/19 0902  CRP 9.6*    Lab Results  Component Value Date   SARSCOV2NAA NEGATIVE 12/14/2019   SARSCOV2NAA NEGATIVE 08/19/2019   SARSCOV2NAA NEGATIVE 12/28/2018   SARSCOV2NAA NEGATIVE 12/28/2018     Scheduled medications:   . enoxaparin (LOVENOX) injection  40 mg Subcutaneous Q24H  . famotidine  20 mg Oral Daily  . feeding supplement  237 mL Oral TID BM  . ferrous fumarate-b12-vitamic C-folic acid  1 capsule Oral BID PC  . gabapentin  400 mg Oral TID  . hydrOXYzine  25 mg Oral TID  . morphine  15 mg Oral Q12H  . multivitamin with minerals  1 tablet Oral Daily  . naproxen  500 mg Oral TID WC  . sodium chloride flush  3 mL Intravenous Q12H  . spironolactone  12.5 mg Oral Daily  . traZODone  50 mg Oral QHS  CBG: No results for input(s): GLUCAP in the last 168 hours.  SpO2: 97 % O2 Flow Rate (L/min): 2 L/min    CBC: Recent Labs  Lab 12/21/19 0109 12/22/19 0438 12/23/19 0518 12/24/19 0515 12/27/19 0902  WBC 13.5* 14.9* 15.7* 13.0* 17.9*  NEUTROABS  --   --  11.1*  9.4* 14.6*  HGB 7.8* 7.8* 7.7* 7.2* 8.3*  HCT 25.8* 25.7* 24.3* 24.3* 27.1*  MCV 92.1 90.5 89.0 92.4 90.9  PLT 417* 448* 417* 375 338    Basic Metabolic Panel: Recent Labs  Lab 12/21/19 0109 12/22/19 0438 12/23/19 0518 12/24/19 0515  NA 134* 136 135 136  K 5.0 4.5 4.5 4.6  CL 107 103 101 104  CO2 19* 23 24 23   GLUCOSE 104* 101* 84 96  BUN 17 18 27* 22*  CREATININE 0.73 0.79 1.01 0.97  CALCIUM 7.7* 8.2* 8.2* 8.3*  MG 1.9 1.9 1.9  --      Liver Function Tests: Recent Labs  Lab 12/21/19 0109 12/22/19 0438 12/23/19 0518 12/24/19 0515  AST 39 23 23 17   ALT 52* 41 37 30  ALKPHOS 233* 222* 283* 220*  BILITOT 0.7 0.7 0.8 0.4  PROT 6.5 7.0 7.3 7.0  ALBUMIN 1.5* 1.8* 1.9* 1.8*     Antibiotics: Anti-infectives (From admission, onward)   Start     Dose/Rate Route Frequency Ordered Stop   12/25/19 1815  vancomycin (VANCOCIN) IVPB 1000 mg/200 mL premix        1,000 mg 100 mL/hr over 120 Minutes Intravenous Every 12 hours 12/25/19 1804     12/21/19 1800  vancomycin (VANCOCIN) IVPB 1000 mg/200 mL premix  Status:  Discontinued        1,000 mg 200 mL/hr over 60 Minutes Intravenous Every 12 hours 12/21/19 1004 12/25/19 1804   12/17/19 1400  vancomycin (VANCOREADY) IVPB 1500 mg/300 mL  Status:  Discontinued        1,500 mg 150 mL/hr over 120 Minutes Intravenous Every 12 hours 12/17/19 1227 12/21/19 1004   12/14/19 1800  vancomycin (VANCOREADY) IVPB 750 mg/150 mL  Status:  Discontinued        750 mg 150 mL/hr over 60 Minutes Intravenous Every 12 hours 12/14/19 1103 12/17/19 1227   12/14/19 0900  vancomycin (VANCOCIN) IVPB 1000 mg/200 mL premix        1,000 mg 200 mL/hr over 60 Minutes Intravenous  Once 12/14/19 0821 12/14/19 1142   12/14/19 0830  piperacillin-tazobactam (ZOSYN) IVPB 3.375 g  Status:  Discontinued        3.375 g 12.5 mL/hr over 240 Minutes Intravenous  Once 12/14/19 0821 12/14/19 0855       DVT prophylaxis: Lovenox  Code Status: Full code  Family  Communication: No family at bedside    Status is: Inpatient  Dispo: The patient is from: Home              Anticipated d/c is to: Home              Anticipated d/c date is: 6 weeks              Patient currently not medically stable for discharge  Barrier to discharge-IV antibiotics for septic emboli, psoas and number paraspinous myositis      Consultants:  CT surgery  Procedures:     Objective   Vitals:   12/26/19 1334 12/26/19 2105 12/27/19 0500 12/27/19 1510  BP: (!) 122/97 127/88 120/80 (!) 119/93  Pulse: 93 85 90 97  Resp:  16 18 17    Temp: 98.5 F (36.9 C) 98 F (36.7 C) 98.3 F (36.8 C)   TempSrc: Oral Oral Oral   SpO2: 97% 98%  97%  Weight:      Height:        Intake/Output Summary (Last 24 hours) at 12/27/2019 1544 Last data filed at 12/27/2019 1300 Gross per 24 hour  Intake 480 ml  Output 1725 ml  Net -1245 ml    No intake/output data recorded.  Filed Weights   12/23/19 0404 12/24/19 0555 12/25/19 0619  Weight: 66.9 kg 66.5 kg 66.4 kg    Physical Examination:  General-appears in no acute distress Heart-S1-S2, regular, no murmur auscultated Lungs-clear to auscultation bilaterally, no wheezing or crackles auscultated Abdomen-soft, nontender, no organomegaly Extremities-no edema in the lower extremities, mild dusky micropapular rash improving on upper extremities Neuro-alert, oriented x3, no focal deficit noted   Data Reviewed:   Recent Results (from the past 240 hour(s))  Culture, blood (routine x 2)     Status: None   Collection Time: 12/20/19  5:52 AM   Specimen: BLOOD  Result Value Ref Range Status   Specimen Description   Final    BLOOD BLOOD LEFT HAND Performed at [facility], [address], [place], [place]    Special Requests   Final    BOTTLES DRAWN AEROBIC ONLY Blood Culture adequate volume Performed at [facility], [address] [place], [place]    Culture    Final    NO GROWTH 5 DAYS Performed at [facility], [address] [place], [place]    Report Status 12/25/2019 FINAL  Final  Culture, blood (routine x 2)     Status: None   Collection Time: 12/20/19  6:07 AM   Specimen: BLOOD  Result Value Ref Range Status   Specimen Description   Final    BLOOD BLOOD LEFT HAND Performed at [facility], [address] [place], [place]    Special Requests   Final    BOTTLES DRAWN AEROBIC ONLY Blood Culture adequate volume Performed at [facility], [address] [place], [place]    Culture   Final    NO GROWTH 5 DAYS Performed at [facility], [address] [place], [place]    Report Status 12/25/2019 FINAL  Final    No results for input(s): LIPASE, AMYLASE in the last 168 hours. No results for input(s): AMMONIA in the last 168 hours.  Cardiac Enzymes: No results for input(s): CKTOTAL, CKMB, CKMBINDEX, TROPONINI in the last 168 hours. BNP (last 3 results) Recent Labs    12/14/19 0810  BNP 526.1*    ProBNP (last 3 results) No results for input(s): PROBNP in the last 8760 hours.  Studies:  No results found.     02/13/20   [facility] If 7PM-7AM, please contact night-coverage at [email or web address], Office  [phone]   12/27/2019, 3:44 PM  LOS: 13 days

## 2019-12-28 NOTE — Progress Notes (Signed)
[facility]    Date of Admission:  12/14/2019   Total days of antibiotics 4           Abx: 10/04-c vancomycin 1gram q12hr; 10/14 trough 17   A/p: 34 y.o. 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  10/04 and 10/06 bcx positive MRSA (vanc mic 0.5); 10/10 first negative but pending final report 10/08 last documented fever Moderate pleural effusion on mri thoracic spine but only small on 10/12 chest ct  CT surgery evaluated not candidate for AngioVac. Future TV valve repair will likely be needed given TV unable to coapt  Other issues: Chronic hep c - will plan to tx outpatient  Hep b serology negative - will need vaccination given risk for hiv/chronic hep b Last hiv screen 12/2018 negative  ----- Wbc lower nadirs overall although still bouncing around Rash bilateral antecubital fossa only along tape; not vanc red person syndrome or allergy related or vasculitis from endocarditis    -continue vancomycin per pharmacy -at least 8 weeks abx needed starting 10/10, but a moving target based on repeat imaging -will need repeat mri scan lumbar area in 3-4 week (around first week November) -will change labs to weekly  Principal Problem:   Endocarditis Active Problems:   Polysubstance dependence (HCC)   IVDU (intravenous drug user)   Acute CHF (congestive heart failure) (HCC)   Anemia   Acute septic pulmonary embolism (HCC)   Goals of care, counseling/discussion   Palliative care encounter   Chronic hepatitis C without hepatic coma (HCC)   Infective myositis    Pleural effusion, bilateral   Malnutrition of moderate degree    Subjective: Asking if bed alarm could be turned off so he can be more mobile No n/v/diarrhea/abd pain Mild left forearm pain but overall improving (present since admission) The rash on bilateral forearm are along tape lines No complaint back pain today No new rash No headache, visual change  Reviewed labs, wbc up and down no clear trend but lower nadirs overall   Medications:  . enoxaparin (LOVENOX) injection  40 mg Subcutaneous Q24H  . famotidine  20 mg Oral Daily  . feeding supplement  237 mL Oral TID BM  . ferrous fumarate-b12-vitamic C-folic acid  1 capsule Oral BID PC  . gabapentin  400 mg Oral TID  . hydrOXYzine  25 mg Oral TID  . morphine  15 mg Oral Q12H  . multivitamin with minerals  1 tablet Oral Daily  . naproxen  500 mg Oral TID WC  . sodium chloride flush  3 mL Intravenous Q12H  . spironolactone  12.5 mg Oral Daily  . traZODone  50 mg Oral QHS    Objective: Vital signs in last 24 hours: Temp:  [98.4 F (36.9 C)] 98.4 F (36.9 C) (10/18 0536) Pulse Rate:  [93-97] 93 (10/18 0536) Resp:  [21] 21 (10/18 0536) BP: (119-120)/(93) 120/93 (10/18 0536) SpO2:  [97 %-98 %] 98 % (10/18 0536) Weight:  [67.9 kg] 67.9 kg (10/18 0536)  Physical Exam  Constitutional: mild distress; conversant; cooperative HEENT: Oropharynx is clear; per; conj clear; eomi CV: RRR,  systolic murmur left sternal border Neck = elevated JVP to angle of jaw Pulmonary; normal respiratory effort; clear lungs Abdominal: Soft, nt Neurological: nonfocal Skin: no rash (minor a few small papular squamous lesions left proximal forearm along site of previous iv tapes, also track mark from skin popping Psychiatric: alert/oriented   Lab Results Recent Labs    12/27/19 0902  WBC 17.9*  HGB 8.3*  HCT 27.1*   Liver Panel No results for input(s): PROT, ALBUMIN, AST, ALT, ALKPHOS, BILITOT, BILIDIR, IBILI in the last 72  hours. Sedimentation Rate No results for input(s): ESRSEDRATE in the last 72 hours.  C-Reactive Protein Recent Labs    12/27/19 0902  CRP 9.6*    Microbiology: 10/10 bcx negative 10/6 blood cx + 10/4 blood cx + (MRSA vanc mic 0.5; S tetra, bactrim)  Imaging: 10/12 chest ct 1. There are numerous bilateral pulmonary nodules throughout the lungs, the largest in the right pulmonary upper lobe measuring 2.4 x 2.1 cm. There are several larger nodules demonstrating subtle cavitation. Findings are consistent with septic emboli. 2. Small bilateral pleural effusions and associated atelectasis or consolidation. 3. Prominent mediastinal lymph nodes, likely reactive.  10/08 cxr Small bilateral pleural effusion Unchanged diffuse heterogenous nodular and patchy opacity both lungs   10/08 mr thoracolumbar 1. No evidence for osteomyelitis discitis or septic arthritis within the thoracic or lumbar spine. No epidural abscess or other intraspinal infection. 2. Diffuse edema throughout the lower lumbar posterior paraspinous musculature, with similar changes along the posterior margins of the psoas musculature bilaterally. Findings suggestive of acute myositis, likely infectious in nature. No loculated soft tissue collections or abscess. 3. Large irregular bilateral pleural effusions with associated cystic/cavitary changes within the partially visualized lungs, consistent with known history of septic emboli. 4. Shallow left subarticular to foraminal disc protrusion at L5-S1 with resultant mild left foraminal and left lateral recess stenosis.  Tonita Phoenix [facility] Cell: [phone] Pager: [phone]  12/28/2019, 10:00 AM

## 2019-12-28 NOTE — Progress Notes (Signed)
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  Patient admitted with significant bilateral leg swelling, shortness of breath, positional back pain.  Chest x-ray was concerning for septic emboli and possibly superimposed pneumonia.  ED physician discussed with ID who recommended vancomycin for now.    Subjective   Patient seen and examined, no new complaints.   Assessment/Plan:     1. 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  Blood culture positive on admission, repeat blood cultures on 12/20/2019 so for shows no growth.  CT surgeon Dr. Cliffton Asters was consulted he felt that endocarditis appears to be chronic, patient will not benefit from angiovac.  Patient also not a candidate for tricuspid valve replacement due to ongoing IV drug use.  ID following, recommending 8 weeks of antibiotics starting from 12/20/2019.  He will need repeat MRI lumbar area in 3 to 4 weeks around first week of November.   2. Bilateral upper extremity rash-resolved, patient developed bilateral extremity rash at the site of IV.  Also complained of itching and has been scratching.  Started on hydroxyzine yesterday and has significantly improved.  IV tape also has been changed to nonlatex. 3. Iron deficiency-continue oral iron and vitamin C. hemoglobin is stable at 8.2.  S/p 1 unit PRBC.  Continue to monitor hemoglobin.  Transfuse for hemoglobin less than 7. 4. Acute on chronic right ventricular failure-with  severe tricuspid regurgitation, hypoalbuminemia with third spacing.  Patient started on Aldactone.  IV Lasix has been discontinued.  Started on low-dose Lasix 20 mg daily. 5. Polysubstance abuse-UDS positive for amphetamine and opiates. 6. Chronic pain syndrome-pain is better controlled after starting MS Contin 15 mg p.o. twice daily.  Continue Dilaudid  1 mg every 6 hours as needed for pain. 7. Scrotal swelling-ultrasound with Doppler was negative for acute abnormality.  Likely third spacing from hypoalbuminemia.  Albumin is 1.8.  Dr. Allena Katz discussed with urology who recommended conservative measures and scrotal support.  Started on low-dose Lasix 20 mg p.o. daily. 8. Hypoalbuminemia-Albumin level is 1.8.  Dietitian has been consulted.  Patient started on protein supplementation. 9. Hepatitis C infection-not treated, management per ID. 10. Transaminitis-improved with IV Lasix, likely from passive liver congestion.     COVID-19 Labs  Recent Labs    12/27/19 0902  CRP 9.6*    Lab Results  Component Value Date   SARSCOV2NAA NEGATIVE 12/14/2019   SARSCOV2NAA NEGATIVE 08/19/2019   SARSCOV2NAA NEGATIVE 12/28/2018   SARSCOV2NAA NEGATIVE 12/28/2018     Scheduled medications:   . enoxaparin (LOVENOX) injection  40 mg Subcutaneous Q24H  . famotidine  20 mg Oral Daily  . feeding supplement  237 mL Oral TID BM  . ferrous fumarate-b12-vitamic C-folic acid  1 capsule Oral BID PC  . gabapentin  400 mg Oral TID  . hydrOXYzine  25 mg Oral TID  . morphine  15 mg Oral Q12H  . multivitamin with minerals  1 tablet Oral Daily  . naproxen  500 mg Oral TID WC  . sodium chloride flush  3 mL Intravenous Q12H  . spironolactone  12.5 mg Oral  Daily  . traZODone  50 mg Oral QHS         CBG: No results for input(s): GLUCAP in the last 168 hours.  SpO2: 98 % O2 Flow Rate (L/min): 2 L/min    CBC: Recent Labs  Lab 12/22/19 0438 12/23/19 0518 12/24/19 0515 12/27/19 0902  WBC 14.9* 15.7*  13.0* 17.9*  NEUTROABS  --  11.1* 9.4* 14.6*  HGB 7.8* 7.7* 7.2* 8.3*  HCT 25.7* 24.3* 24.3* 27.1*  MCV 90.5 89.0 92.4 90.9  PLT 448* 417* 375 338    Basic Metabolic Panel: Recent Labs  Lab 12/22/19 0438 12/23/19 0518 12/24/19 0515  NA 136 135 136  K 4.5 4.5 4.6  CL 103 101 104  CO2 23 24 23   GLUCOSE 101* 84 96  BUN 18 27* 22*  CREATININE 0.79 1.01 0.97  CALCIUM 8.2* 8.2* 8.3*  MG 1.9 1.9  --      Liver Function Tests: Recent Labs  Lab 12/22/19 0438 12/23/19 0518 12/24/19 0515  AST 23 23 17   ALT 41 37 30  ALKPHOS 222* 283* 220*  BILITOT 0.7 0.8 0.4  PROT 7.0 7.3 7.0  ALBUMIN 1.8* 1.9* 1.8*     Antibiotics: Anti-infectives (From admission, onward)   Start     Dose/Rate Route Frequency Ordered Stop   12/25/19 1815  vancomycin (VANCOCIN) IVPB 1000 mg/200 mL premix        1,000 mg 100 mL/hr over 120 Minutes Intravenous Every 12 hours 12/25/19 1804     12/21/19 1800  vancomycin (VANCOCIN) IVPB 1000 mg/200 mL premix  Status:  Discontinued        1,000 mg 200 mL/hr over 60 Minutes Intravenous Every 12 hours 12/21/19 1004 12/25/19 1804   12/17/19 1400  vancomycin (VANCOREADY) IVPB 1500 mg/300 mL  Status:  Discontinued        1,500 mg 150 mL/hr over 120 Minutes Intravenous Every 12 hours 12/17/19 1227 12/21/19 1004   12/14/19 1800  vancomycin (VANCOREADY) IVPB 750 mg/150 mL  Status:  Discontinued        750 mg 150 mL/hr over 60 Minutes Intravenous Every 12 hours 12/14/19 1103 12/17/19 1227   12/14/19 0900  vancomycin (VANCOCIN) IVPB 1000 mg/200 mL premix        1,000 mg 200 mL/hr over 60 Minutes Intravenous  Once 12/14/19 0821 12/14/19 1142   12/14/19 0830  piperacillin-tazobactam (ZOSYN) IVPB 3.375 g  Status:  Discontinued        3.375 g 12.5 mL/hr over 240 Minutes Intravenous  Once 12/14/19 0821 12/14/19 0855       DVT prophylaxis: Lovenox  Code Status: Full code  Family Communication: No family at bedside    Status is: Inpatient  Dispo: The  patient is from: Home              Anticipated d/c is to: Home              Anticipated d/c date is: 8 weeks starting from 12/20/2019              Patient currently not medically stable for discharge  Barrier to discharge-IV antibiotics for septic emboli, psoas and number paraspinous myositis      Consultants:  CT surgery  Procedures:     Objective   Vitals:   12/26/19 2105 12/27/19 0500 12/27/19 1510 12/28/19 0536  BP: 127/88 120/80 (!) 119/93 (!) 120/93  Pulse: 85 90 97 93  Resp: 18 17  (!) 21  Temp: 98 F (  36.7 C) 98.3 F (36.8 C)  98.4 F (36.9 C)  TempSrc: Oral Oral  Oral  SpO2: 98%  97% 98%  Weight:    67.9 kg  Height:        Intake/Output Summary (Last 24 hours) at 12/28/2019 1336 Last data filed at 12/28/2019 1242 Gross per 24 hour  Intake 723 ml  Output 3825 ml  Net -3102 ml    10/16 1901 - 10/18 0700 In: 480 [P.O.:480] Out: 4900 [Urine:4900]  Filed Weights   12/24/19 0555 12/25/19 0619 12/28/19 0536  Weight: 66.5 kg 66.4 kg 67.9 kg    Physical Examination:  General-appears in no acute distress Heart-S1-S2, regular, no murmur auscultated Lungs-clear to auscultation bilaterally, no wheezing or crackles auscultated Abdomen-soft, nontender, no organomegaly Extremities-no edema in the lower extremities Neuro-alert, oriented x3, no focal deficit noted   Data Reviewed:   Recent Results (from the past 240 hour(s))  Culture, blood (routine x 2)     Status: None   Collection Time: 12/20/19  5:52 AM   Specimen: BLOOD  Result Value Ref Range Status   Specimen Description   Final    BLOOD BLOOD LEFT HAND Performed at [facility], [address] [place], [place] [address]    Special Requests   Final    BOTTLES DRAWN AEROBIC ONLY Blood Culture adequate volume Performed at [facility], [address], [place], [place] [address]    Culture   Final    NO GROWTH 5 DAYS Performed at [facility],  [address], [place], [place] [address]    Report Status 12/25/2019 FINAL  Final  Culture, blood (routine x 2)     Status: None   Collection Time: 12/20/19  6:07 AM   Specimen: BLOOD  Result Value Ref Range Status   Specimen Description   Final    BLOOD BLOOD LEFT HAND Performed at [facility], [address], [place], [place] [address]    Special Requests   Final    BOTTLES DRAWN AEROBIC ONLY Blood Culture adequate volume Performed at [facility], [address] [place], [place] [address]    Culture   Final    NO GROWTH 5 DAYS Performed at [facility], [address] [place], [place] [address]    Report Status 12/25/2019 FINAL  Final    No results for input(s): LIPASE, AMYLASE in the last 168 hours. No results for input(s): AMMONIA in the last 168 hours.  Cardiac Enzymes: No results for input(s): CKTOTAL, CKMB, CKMBINDEX, TROPONINI in the last 168 hours. BNP (last 3 results) Recent Labs    12/14/19 0810  BNP 526.1*    ProBNP (last 3 results) No results for input(s): PROBNP in the last 8760 hours.  Studies:  No results found.     Meredeth Ide   [facility] If 7PM-7AM, please contact night-coverage at [email or web address], Office  [phone]   12/28/2019, 1:36 PM  LOS: 14 days

## 2019-12-28 NOTE — Plan of Care (Signed)
  Problem: Clinical Measurements: Goal: Ability to maintain clinical measurements within normal limits will improve Outcome: Progressing Goal: Will remain free from infection Outcome: Progressing Goal: Diagnostic test results will improve Outcome: Progressing   Problem: Activity: Goal: Risk for activity intolerance will decrease Outcome: Progressing   Problem: Nutrition: Goal: Adequate nutrition will be maintained Outcome: Progressing   Problem: Coping: Goal: Level of anxiety will decrease Outcome: Progressing   Problem: Pain Managment: Goal: General experience of comfort will improve Outcome: Progressing   Problem: Safety: Goal: Ability to remain free from injury will improve Outcome: Progressing   Problem: Skin Integrity: Goal: Risk for impaired skin integrity will decrease Outcome: Progressing   

## 2019-12-29 LAB — COMPREHENSIVE METABOLIC PANEL
ALT: 17 U/L (ref 0–44)
AST: 16 U/L (ref 15–41)
Albumin: 2.2 g/dL — ABNORMAL LOW (ref 3.5–5.0)
Alkaline Phosphatase: 209 U/L — ABNORMAL HIGH (ref 38–126)
Anion gap: 7 (ref 5–15)
BUN: 21 mg/dL — ABNORMAL HIGH (ref 6–20)
CO2: 28 mmol/L (ref 22–32)
Calcium: 9 mg/dL (ref 8.9–10.3)
Chloride: 103 mmol/L (ref 98–111)
Creatinine, Ser: 0.98 mg/dL (ref 0.61–1.24)
GFR, Estimated: >60 mL/min (ref >60)
Glucose, Bld: 96 mg/dL (ref 70–99)
Potassium: 4.9 mmol/L (ref 3.5–5.1)
Sodium: 138 mmol/L (ref 135–145)
Total Bilirubin: 0.8 mg/dL (ref 0.3–1.2)
Total Protein: 8.2 g/dL — ABNORMAL HIGH (ref 6.5–8.1)

## 2019-12-29 LAB — CBC
HCT: 28.9 % — ABNORMAL LOW (ref 39.0–52.0)
Hemoglobin: 8.7 g/dL — ABNORMAL LOW (ref 13.0–17.0)
MCH: 27.7 pg (ref 26.0–34.0)
MCHC: 30.1 g/dL (ref 30.0–36.0)
MCV: 92 fL (ref 80.0–100.0)
Platelets: 292 10*3/uL (ref 150–400)
RBC: 3.14 MIL/uL — ABNORMAL LOW (ref 4.22–5.81)
RDW: 17.9 % — ABNORMAL HIGH (ref 11.5–15.5)
WBC: 11.2 10*3/uL — ABNORMAL HIGH (ref 4.0–10.5)
nRBC: 0 % (ref 0.0–0.2)

## 2019-12-29 LAB — VANCOMYCIN, TROUGH: Vancomycin Tr: 19 ug/mL (ref 15–20)

## 2019-12-29 NOTE — Progress Notes (Signed)
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  Patient admitted with significant bilateral leg swelling, shortness of breath, positional back pain.  Chest x-ray was concerning for septic emboli and possibly superimposed pneumonia.  ED physician discussed with ID who recommended vancomycin for now.    Subjective   Patient seen and examined, denies any complaints.   Assessment/Plan:     1. 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  Blood culture positive on admission, repeat blood cultures on 12/20/2019 so for shows no growth.  CT surgeon Dr. Cliffton Asters was consulted he felt that endocarditis appears to be chronic, patient will not benefit from angiovac.  Patient also not a candidate for tricuspid valve replacement due to ongoing IV drug use.  ID following, recommending 8 weeks of antibiotics starting from 12/20/2019.  He will need repeat MRI lumbar area in 3 to 4 weeks around first week of November.   2. Bilateral upper extremity rash-resolved, patient developed bilateral extremity rash at the site of IV.  Also complained of itching and has been scratching.  Started on hydroxyzine yesterday and has significantly improved.  IV tape also has been changed to nonlatex. 3. Iron deficiency-continue oral iron and vitamin C. hemoglobin is stable at 8.2.  S/p 1 unit PRBC.  Continue to monitor hemoglobin.  Transfuse for hemoglobin less than 7. 4. Acute on chronic right ventricular failure-with  severe tricuspid regurgitation, hypoalbuminemia with third spacing.  Patient started on Aldactone.  IV Lasix has been discontinued.  Started on low-dose Lasix 20 mg daily. 5. Polysubstance abuse-UDS positive for amphetamine and opiates. 6. Chronic pain syndrome-pain is better controlled after starting MS Contin 15 mg p.o. twice daily.  Continue Dilaudid  1 mg every 6 hours as needed for pain.  Also started on gabapentin for neuropathic pain. 7. Scrotal swelling-ultrasound with Doppler was negative for acute abnormality.  Likely third spacing from hypoalbuminemia.  Albumin is was 1.8, now improved to 2.2. .  Dr. Allena Katz discussed with urology who recommended conservative measures and scrotal support.  Started on low-dose Lasix 20 mg p.o. daily. 8. Hypoalbuminemia-Albumin level has improved to 2.2.  Dietitian was consulted and patient placed on protein supplements.  Continue protein supplementation.   9. Hepatitis C infection-not treated, management per ID. 10. Transaminitis-improved with IV Lasix, likely from passive liver congestion.     COVID-19 Labs  Recent Labs    12/27/19 0902  CRP 9.6*    Lab Results  Component Value Date   SARSCOV2NAA NEGATIVE 12/14/2019   SARSCOV2NAA NEGATIVE 08/19/2019   SARSCOV2NAA NEGATIVE 12/28/2018   SARSCOV2NAA NEGATIVE 12/28/2018     Scheduled medications:    enoxaparin (LOVENOX) injection  40 mg Subcutaneous Q24H   famotidine  20 mg Oral Daily   feeding supplement  237 mL Oral TID BM   ferrous fumarate-b12-vitamic C-folic acid  1 capsule Oral BID PC   gabapentin  400 mg Oral TID   hydrOXYzine  25 mg Oral TID   morphine  15 mg Oral Q12H   multivitamin with minerals  1 tablet Oral Daily   naproxen  500  mg Oral TID WC   sodium chloride flush  3 mL Intravenous Q12H   spironolactone  12.5 mg Oral Daily   traZODone  50 mg Oral QHS         CBG: No results for input(s): GLUCAP in the last 168 hours.  SpO2: 100 % O2 Flow Rate  (L/min): 2 L/min    CBC: Recent Labs  Lab 12/23/19 0518 12/24/19 0515 12/27/19 0902 12/29/19 0455  WBC 15.7* 13.0* 17.9* 11.2*  NEUTROABS 11.1* 9.4* 14.6*  --   HGB 7.7* 7.2* 8.3* 8.7*  HCT 24.3* 24.3* 27.1* 28.9*  MCV 89.0 92.4 90.9 92.0  PLT 417* 375 338 292    Basic Metabolic Panel: Recent Labs  Lab 12/23/19 0518 12/24/19 0515 12/29/19 0455  NA 135 136 138  K 4.5 4.6 4.9  CL 101 104 103  CO2 24 23 28   GLUCOSE 84 96 96  BUN 27* 22* 21*  CREATININE 1.01 0.97 0.98  CALCIUM 8.2* 8.3* 9.0  MG 1.9  --   --      Liver Function Tests: Recent Labs  Lab 12/23/19 0518 12/24/19 0515 12/29/19 0455  AST 23 17 16   ALT 37 30 17  ALKPHOS 283* 220* 209*  BILITOT 0.8 0.4 0.8  PROT 7.3 7.0 8.2*  ALBUMIN 1.9* 1.8* 2.2*     Antibiotics: Anti-infectives (From admission, onward)   Start     Dose/Rate Route Frequency Ordered Stop   12/25/19 1815  vancomycin (VANCOCIN) IVPB 1000 mg/200 mL premix        1,000 mg 100 mL/hr over 120 Minutes Intravenous Every 12 hours 12/25/19 1804     12/21/19 1800  vancomycin (VANCOCIN) IVPB 1000 mg/200 mL premix  Status:  Discontinued        1,000 mg 200 mL/hr over 60 Minutes Intravenous Every 12 hours 12/21/19 1004 12/25/19 1804   12/17/19 1400  vancomycin (VANCOREADY) IVPB 1500 mg/300 mL  Status:  Discontinued        1,500 mg 150 mL/hr over 120 Minutes Intravenous Every 12 hours 12/17/19 1227 12/21/19 1004   12/14/19 1800  vancomycin (VANCOREADY) IVPB 750 mg/150 mL  Status:  Discontinued        750 mg 150 mL/hr over 60 Minutes Intravenous Every 12 hours 12/14/19 1103 12/17/19 1227   12/14/19 0900  vancomycin (VANCOCIN) IVPB 1000 mg/200 mL premix        1,000 mg 200 mL/hr over 60 Minutes Intravenous  Once 12/14/19 0821 12/14/19 1142   12/14/19 0830  piperacillin-tazobactam (ZOSYN) IVPB 3.375 g  Status:  Discontinued        3.375 g 12.5 mL/hr over 240 Minutes Intravenous  Once 12/14/19 0821 12/14/19 0855       DVT prophylaxis:  Lovenox  Code Status: Full code  Family Communication: No family at bedside    Status is: Inpatient  Dispo: The patient is from: Home              Anticipated d/c is to: Home              Anticipated d/c date is: 8 weeks starting from 12/20/2019              Patient currently not medically stable for discharge  Barrier to discharge-IV antibiotics for septic emboli, psoas and number paraspinous myositis      Consultants:  CT surgery  Procedures:     Objective   Vitals:   12/28/19 0536 12/28/19 1431 12/28/19 1945 12/29/19 0554  BP: 12/30/19)  120/93 111/90 117/83 (!) 126/91  Pulse: 93 90 92 93  Resp: (!) 21 14 16  (!) 24  Temp: 98.4 F (36.9 C) 98.5 F (36.9 C) 98.2 F (36.8 C) 97.9 F (36.6 C)  TempSrc: Oral Oral Oral Oral  SpO2: 98% 98% 99% 100%  Weight: 67.9 kg     Height:        Intake/Output Summary (Last 24 hours) at 12/29/2019 1535 Last data filed at 12/29/2019 1100 Gross per 24 hour  Intake 1831 ml  Output 2550 ml  Net -719 ml    10/17 1901 - 10/19 0700 In: 2584 [P.O.:2160; I.V.:224] Out: 5250 [Urine:5250]  Filed Weights   12/24/19 0555 12/25/19 0619 12/28/19 0536  Weight: 66.5 kg 66.4 kg 67.9 kg    Physical Examination:  General-appears in no acute distress Heart-S1-S2, regular, no murmur auscultated Lungs-clear to auscultation bilaterally, no wheezing or crackles auscultated Abdomen-soft, nontender, no organomegaly Extremities-no edema in the lower extremities Neuro-alert, oriented x3, no focal deficit noted   Data Reviewed:   Recent Results (from the past 240 hour(s))  Culture, blood (routine x 2)     Status: None   Collection Time: 12/20/19  5:52 AM   Specimen: BLOOD  Result Value Ref Range Status   Specimen Description   Final    BLOOD BLOOD LEFT HAND Performed at [facility], [address], [place], [place]    Special Requests   Final    BOTTLES DRAWN AEROBIC ONLY Blood Culture adequate  volume Performed at [facility], [address] [place], [place]    Culture   Final    NO GROWTH 5 DAYS Performed at [facility], [address], [place], [place]    Report Status 12/25/2019 FINAL  Final  Culture, blood (routine x 2)     Status: None   Collection Time: 12/20/19  6:07 AM   Specimen: BLOOD  Result Value Ref Range Status   Specimen Description   Final    BLOOD BLOOD LEFT HAND Performed at [facility], [address] [place], [place]    Special Requests   Final    BOTTLES DRAWN AEROBIC ONLY Blood Culture adequate volume Performed at [facility], [address], [place], [place]    Culture   Final    NO GROWTH 5 DAYS Performed at [facility], [address], [place], [place]    Report Status 12/25/2019 FINAL  Final    No results for input(s): LIPASE, AMYLASE in the last 168 hours. No results for input(s): AMMONIA in the last 168 hours.  Cardiac Enzymes: No results for input(s): CKTOTAL, CKMB, CKMBINDEX, TROPONINI in the last 168 hours. BNP (last 3 results) Recent Labs    12/14/19 0810  BNP 526.1*    ProBNP (last 3 results) No results for input(s): PROBNP in the last 8760 hours.  Studies:  No results found.     02/13/20   [facility] If 7PM-7AM, please contact night-coverage at [email or web address], Office  [phone]   12/29/2019, 3:35 PM  LOS: 15 days

## 2019-12-29 NOTE — Progress Notes (Signed)
Pharmacy Antibiotic Note  Alex [name] is a 34 y.o. male admitted on 12/14/2019 with MRSA endocarditis w/ septic emboli (BCx from [facility] where patient recently left AMA, but was admitted to [facility] in Oct 2020 for same). Pharmacy has been consulted for vancomycin dosing.  Day #16 Vanc WBC overall improved SCr stable Trough 19 mcg/ml on 10/19, at goal 15-20 Trough drawn 30 minutes early true trough closer to 18  Plan:  Continue vancomycin to 1g IV q12h   Plans to treat 8 weeks from 10/10>12/5, subject to change based on imaging  Plan to recheck trough as renal function indicates or every 5-7 days if stable  Monitor clinical course, renal function, cultures as available   Height: 5\' 8"  (172.7 cm) Weight: 67.9 kg (149 lb 12.8 oz) IBW/kg (Calculated) : 68.4  Temp (24hrs), Avg:98.2 F (36.8 C), Min:97.9 F (36.6 C), Max:98.5 F (36.9 C)  Recent Labs  Lab 12/23/19 0518 12/24/19 0515 12/27/19 0902 12/29/19 0455  WBC 15.7* 13.0* 17.9* 11.2*  CREATININE 1.01 0.97  --  0.98  VANCOTROUGH  --  17  --  19    Estimated Creatinine Clearance: 102 mL/min (by C-G formula based on SCr of 0.98 mg/dL).    No Known Allergies  10/4 vancomycin >>   Dose adjustments/levels this admission:  10/7 0500 = 9  on vanc 750mg  q12>> inc to 1500 mg IV q12h  10/11 0100 VT = 21 on vanc 1500mg  q12, decr 1g q12h 10/14 0515 VT 17 on vanc 1000mg  IV q12h - continue  10/19 0455 VT 19 on vanc 1000mg  IV q12h - continue   Microbiology results: BCx pending from [facility] hospital attempting to get records 10/4 BCx: 3 of 4 MRSA 10/6 BCx: staph aureus in 1 set - MRSA 10/10 repeat BCx: NGF  Thank you for allowing pharmacy to be a part of this patient's care.  11/19, PharmD Infectious Disease Pharmacist  Phone: [phone] 12/29/2019 8:06 AM

## 2019-12-29 NOTE — Progress Notes (Signed)
PT Cancellation Note  Patient Details Name: Alex [name] MRN: [id number] DOB: [birth year]   Cancelled Treatment:    Reason Eval/Treat Not Completed: PT screened, no needs identified, will sign off. Pt seen for multiple PT visits this admission. He was d/c'd from PT on 12/25/19, independent with mobility. Pt may amb with staff and  Was requesting to have bed alarm off so he may mobilize in the room on his own--RN made aware at time of d/c from PT  [facility] 12/29/2019, 12:39 PM

## 2019-12-30 NOTE — Progress Notes (Signed)
PROGRESS NOTE   Alex [name]  ZOX:[id number]    DOB: 03-11-1986    DOA: 12/14/2019  PCP: Patient, No Pcp Per   I have briefly reviewed patients previous medical records in [facility].  Chief Complaint  Patient presents with  . Fatigue    Brief Narrative:  34 year old male with PMH of IVDU, history of MRSA endocarditis in 2020, alcohol abuse, polysubstance abuse, depression, tobacco use, recently admitted at [facility] in [place] where he was found to have MRSA bacteremia and endocarditis, left AMA and readmitted here feeling poorly.  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  Blood cultures positive on admission.  Repeat blood cultures 12/20/2019: Negative.  CT surgeon Dr. Cliffton Asters was consulted and he felt that endocarditis appears to be chronic, patient will not benefit from Angiomax.  Patient also not a candidate for tricuspid valve repair replacement due to ongoing IVDA.  He will need repeat MRI of lumbar spine in 3 to 4 weeks around the first week of November.  Bilateral upper extremity rash: Resolved.  Iron deficiency anemia Continue oral iron and vitamin C.  S/p 1 unit PRBC this admission.  Hemoglobin stable in the 8 g range.  Follow CBC  periodically.  Acute on chronic right ventricular failure with severe tricuspid regurgitation: IV Lasix discontinued.  Continue low-dose Aldactone.  Clinically euvolemic.?  Start oral Lasix as well.  Polysubstance abuse: UDS positive for amphetamines and opiates.  Chronic pain syndrome Pain currently controlled on the regimen below.  No changes today.  Scrotal swelling: Ultrasound of Doppler negative for acute abnormality.  Likely third spacing from hypoalbuminemia.  Prior hospitalist discussed with urology who recommended conservative measures and scrotal support.  Seems to have resolved.  Hypoalbuminemia Due to acute illness.  Dietary supplements.  Dietitian consulted.  Hepatitis C infection: Not treated.  Outpatient follow-up with ID.  Transaminitis: Likely from passive hepatic congestion.  Improved.  Body mass index is 20.72 kg/m.  Nutritional Status Nutrition Problem: Moderate Malnutrition Etiology: social / environmental circumstances (polysubstance abuse, homelessness) Signs/Symptoms: mild fat depletion, moderate muscle depletion, energy intake < 75% for > or equal to 3 months Interventions: Ensure Enlive (each supplement provides 350kcal and 20 grams of protein), Magic cup, Education  DVT prophylaxis: enoxaparin (LOVENOX) injection 40 mg Start: 12/15/19 2200 Place and maintain sequential compression device Start: 12/14/19 1121     Code Status: Full Code Family Communication:  Disposition:  Status is: Inpatient  Remains inpatient appropriate because:IV treatments appropriate due to intensity of illness or inability to take PO   Dispo: The patient is from: Home              Anticipated d/c is to: Home              Anticipated d/c date is: > 3 days  Patient currently is not medically stable to d/c.        Consultants:   Infectious disease Cardiothoracic surgery  Procedures:   None  Antimicrobials:    Anti-infectives (From admission,  onward)   Start     Dose/Rate Route Frequency Ordered Stop   12/25/19 1815  vancomycin (VANCOCIN) IVPB 1000 mg/200 mL premix        1,000 mg 100 mL/hr over 120 Minutes Intravenous Every 12 hours 12/25/19 1804     12/21/19 1800  vancomycin (VANCOCIN) IVPB 1000 mg/200 mL premix  Status:  Discontinued        1,000 mg 200 mL/hr over 60 Minutes Intravenous Every 12 hours 12/21/19 1004 12/25/19 1804   12/17/19 1400  vancomycin (VANCOREADY) IVPB 1500 mg/300 mL  Status:  Discontinued        1,500 mg 150 mL/hr over 120 Minutes Intravenous Every 12 hours 12/17/19 1227 12/21/19 1004   12/14/19 1800  vancomycin (VANCOREADY) IVPB 750 mg/150 mL  Status:  Discontinued        750 mg 150 mL/hr over 60 Minutes Intravenous Every 12 hours 12/14/19 1103 12/17/19 1227   12/14/19 0900  vancomycin (VANCOCIN) IVPB 1000 mg/200 mL premix        1,000 mg 200 mL/hr over 60 Minutes Intravenous  Once 12/14/19 0821 12/14/19 1142   12/14/19 0830  piperacillin-tazobactam (ZOSYN) IVPB 3.375 g  Status:  Discontinued        3.375 g 12.5 mL/hr over 240 Minutes Intravenous  Once 12/14/19 0821 12/14/19 0855        Subjective:  Seen this morning.  Denies complaints.  Specifically did not report pain or itching.  Objective:   Vitals:   12/29/19 2153 12/30/19 0519 12/30/19 0521 12/30/19 1248  BP: 127/86 116/83  117/88  Pulse: 90 96  (!) 101  Resp: 16 14  15   Temp: 98 F (36.7 C) 98.2 F (36.8 C)  98.8 F (37.1 C)  TempSrc:    Oral  SpO2: 99% 97%  97%  Weight:   61.8 kg   Height:        General exam: Young male, moderately built and nourished lying comfortably supine in bed. Respiratory system: Clear to auscultation. Respiratory effort normal. Cardiovascular system: S1 & S2 heard, RRR. No JVD, murmurs, rubs, gallops or clicks. No pedal edema.  Telemetry personally reviewed: Sinus rhythm-occasional mild sinus tachycardia in the 100s Gastrointestinal system: Abdomen is nondistended, soft and nontender. No  organomegaly or masses felt. Normal bowel sounds heard. Central nervous system: Alert and oriented. No focal neurological deficits. Extremities: Symmetric 5 x 5 power. Skin: No rashes, lesions or ulcers Psychiatry: Judgement and insight appear normal. Mood & affect appropriate.     Data Reviewed:   I have personally reviewed following labs and imaging studies   CBC: Recent Labs  Lab 12/24/19 0515 12/27/19 0902 12/29/19 0455  WBC 13.0* 17.9* 11.2*  NEUTROABS 9.4* 14.6*  --   HGB 7.2* 8.3* 8.7*  HCT 24.3* 27.1* 28.9*  MCV 92.4 90.9 92.0  PLT 375 338 292    Basic Metabolic Panel: Recent Labs  Lab 12/24/19 0515 12/29/19 0455  NA 136 138  K 4.6 4.9  CL 104 103  CO2 23 28  GLUCOSE 96 96  BUN 22* 21*  CREATININE 0.97 0.98  CALCIUM 8.3* 9.0    Liver Function Tests: Recent Labs  Lab 12/24/19 0515 12/29/19 0455  AST 17 16  ALT 30 17  ALKPHOS 220* 209*  BILITOT 0.4  0.8  PROT 7.0 8.2*  ALBUMIN 1.8* 2.2*    CBG: No results for input(s): GLUCAP in the last 168 hours.  Microbiology Studies:  No results found for this or any previous visit (from the past 240 hour(s)).   Radiology Studies:  No results found.   Scheduled Meds:   . enoxaparin (LOVENOX) injection  40 mg Subcutaneous Q24H  . famotidine  20 mg Oral Daily  . feeding supplement  237 mL Oral TID BM  . ferrous fumarate-b12-vitamic C-folic acid  1 capsule Oral BID PC  . gabapentin  400 mg Oral TID  . hydrOXYzine  25 mg Oral TID  . morphine  15 mg Oral Q12H  . multivitamin with minerals  1 tablet Oral Daily  . naproxen  500 mg Oral TID WC  . sodium chloride flush  3 mL Intravenous Q12H  . spironolactone  12.5 mg Oral Daily  . traZODone  50 mg Oral QHS    Continuous Infusions:   . sodium chloride 250 mL (12/25/19 1321)  . lactated ringers 10 mL/hr at 12/18/19 1227  . vancomycin 1,000 mg (12/30/19 1803)     LOS: 16 days     Marcellus Scott, MD, [place], [facility]. [facility]    To  contact the attending provider between 7A-7P or the covering provider during after hours 7P-7A, please log into the web site [email or web address] and access using universal [place] password for that web site. If you do not have the password, please call the hospital operator.  12/30/2019, 6:16 PM

## 2019-12-31 NOTE — Progress Notes (Signed)
Transferred [id number] from 4E.

## 2019-12-31 NOTE — Progress Notes (Signed)
PROGRESS NOTE   Alex [name]  [name]    DOB: 08/17/1985    DOA: 12/14/2019  PCP: Patient, No Pcp Per   I have briefly reviewed patients previous medical records in [facility].  Chief Complaint  Patient presents with  . Fatigue    Brief Narrative:  34 year old male with PMH of IVDU, history of MRSA endocarditis in 2020, alcohol abuse, polysubstance abuse, depression, tobacco use, recently admitted at [facility] in [place] where he was found to have MRSA bacteremia and endocarditis, left AMA and readmitted here feeling poorly.  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  Stable.   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  Blood cultures positive on admission.  Repeat blood cultures 12/20/2019: Negative.  CT surgeon Dr. Cliffton [name] was consulted and he felt that endocarditis appears to be chronic, patient will not benefit from Angiomax.  Patient also not a candidate for tricuspid valve repair replacement due to ongoing IVDA.  He will need repeat MRI of lumbar spine in 3 to 4 weeks around the first week of November.  Bilateral upper extremity rash: Resolved.  Iron deficiency anemia Continue oral iron and vitamin C.  S/p 1 unit PRBC this admission.  Hemoglobin stable in the 8 g range.  Follow  CBC periodically.  Acute on chronic right ventricular failure with severe tricuspid regurgitation: IV Lasix discontinued.  Continue low-dose Aldactone.  Clinically euvolemic.?  Start oral Lasix as well.  Polysubstance abuse: UDS positive for amphetamines and opiates.  Chronic pain syndrome Pain currently controlled on the regimen below.  No changes today.  Scrotal swelling: Ultrasound of Doppler negative for acute abnormality.  Likely third spacing from hypoalbuminemia.  Prior hospitalist discussed with urology who recommended conservative measures and scrotal support.  Seems to have resolved.  Hypoalbuminemia Due to acute illness.  Dietary supplements.  Dietitian consulted.  Hepatitis C infection: Not treated.  Outpatient follow-up with ID.  Transaminitis: Likely from passive hepatic congestion.  Improved.  Body mass index is 20.72 kg/m.  Nutritional Status Nutrition Problem: Moderate Malnutrition Etiology: social / environmental circumstances (polysubstance abuse, homelessness) Signs/Symptoms: mild fat depletion, moderate muscle depletion, energy intake < 75% for > or equal to 3 months Interventions: Ensure Enlive (each supplement provides 350kcal and 20 grams of protein), Magic cup, Education  DVT prophylaxis: enoxaparin (LOVENOX) injection 40 mg Start: 12/15/19 2200 Place and maintain sequential compression device Start: 12/14/19 1121     Code Status: Full Code Family Communication:  Disposition:  Status is: Inpatient  Remains inpatient appropriate because:IV treatments appropriate due to intensity of illness or inability to take PO   Dispo: The patient is from: Home              Anticipated d/c is to: Home              Anticipated d/c date is: > 3 days  Patient currently is not medically stable to d/c.        Consultants:   Infectious disease Cardiothoracic surgery  Procedures:   None  Antimicrobials:    Anti-infectives (From admission,  onward)   Start     Dose/Rate Route Frequency Ordered Stop   12/25/19 1815  vancomycin (VANCOCIN) IVPB 1000 mg/200 mL premix        1,000 mg 100 mL/hr over 120 Minutes Intravenous Every 12 hours 12/25/19 1804     12/21/19 1800  vancomycin (VANCOCIN) IVPB 1000 mg/200 mL premix  Status:  Discontinued        1,000 mg 200 mL/hr over 60 Minutes Intravenous Every 12 hours 12/21/19 1004 12/25/19 1804   12/17/19 1400  vancomycin (VANCOREADY) IVPB 1500 mg/300 mL  Status:  Discontinued        1,500 mg 150 mL/hr over 120 Minutes Intravenous Every 12 hours 12/17/19 1227 12/21/19 1004   12/14/19 1800  vancomycin (VANCOREADY) IVPB 750 mg/150 mL  Status:  Discontinued        750 mg 150 mL/hr over 60 Minutes Intravenous Every 12 hours 12/14/19 1103 12/17/19 1227   12/14/19 0900  vancomycin (VANCOCIN) IVPB 1000 mg/200 mL premix        1,000 mg 200 mL/hr over 60 Minutes Intravenous  Once 12/14/19 0821 12/14/19 1142   12/14/19 0830  piperacillin-tazobactam (ZOSYN) IVPB 3.375 g  Status:  Discontinued        3.375 g 12.5 mL/hr over 240 Minutes Intravenous  Once 12/14/19 0821 12/14/19 0855        Subjective:  No complaints reported.  Objective:   Vitals:   12/30/19 1248 12/30/19 2148 12/31/19 0656 12/31/19 1151  BP: 117/88 (!) 120/92 114/83 124/82  Pulse: (!) 101 89 98 (!) 109  Resp: 15 (!) 21 18 20   Temp: 98.8 F (37.1 C) 98 F (36.7 C) 98.5 F (36.9 C) 98.2 F (36.8 C)  TempSrc: Oral     SpO2: 97% 98% 96% 98%  Weight:      Height:        General exam: Young male, moderately built and nourished lying comfortably supine in bed. Respiratory system: Clear to auscultation. Respiratory effort normal. Cardiovascular system: S1 & S2 heard, RRR. No JVD, murmurs, rubs, gallops or clicks. No pedal edema.  Telemetry personally reviewed: Sinus rhythm.  Discontinued telemetry 10/21. Gastrointestinal system: Abdomen is nondistended, soft and nontender. No organomegaly or masses felt. Normal bowel sounds  heard. Central nervous system: Alert and oriented. No focal neurological deficits. Extremities: Symmetric 5 x 5 power. Skin: No rashes, lesions or ulcers Psychiatry: Judgement and insight appear normal. Mood & affect appropriate.     Data Reviewed:   I have personally reviewed following labs and imaging studies   CBC: Recent Labs  Lab 12/27/19 0902 12/29/19 0455  WBC 17.9* 11.2*  NEUTROABS 14.6*  --   HGB 8.3* 8.7*  HCT 27.1* 28.9*  MCV 90.9 92.0  PLT 338 292    Basic Metabolic Panel: Recent Labs  Lab 12/29/19 0455  NA 138  K 4.9  CL 103  CO2 28  GLUCOSE 96  BUN 21*  CREATININE 0.98  CALCIUM 9.0    Liver Function Tests: Recent Labs  Lab 12/29/19 0455  AST 16  ALT 17  ALKPHOS 209*  BILITOT 0.8  PROT 8.2*  ALBUMIN 2.2*    CBG: No results for input(s): GLUCAP in the last 168 hours.  Microbiology Studies:  No results found for this or any  previous visit (from the past 240 hour(s)).   Radiology Studies:  No results found.   Scheduled Meds:   . enoxaparin (LOVENOX) injection  40 mg Subcutaneous Q24H  . famotidine  20 mg Oral Daily  . feeding supplement  237 mL Oral TID BM  . ferrous fumarate-b12-vitamic C-folic acid  1 capsule Oral BID PC  . gabapentin  400 mg Oral TID  . hydrOXYzine  25 mg Oral TID  . morphine  15 mg Oral Q12H  . multivitamin with minerals  1 tablet Oral Daily  . naproxen  500 mg Oral TID WC  . sodium chloride flush  3 mL Intravenous Q12H  . spironolactone  12.5 mg Oral Daily  . traZODone  50 mg Oral QHS    Continuous Infusions:   . sodium chloride 250 mL (12/25/19 1321)  . lactated ringers 10 mL/hr at 12/18/19 1227  . vancomycin 1,000 mg (12/31/19 8250)     LOS: 17 days     Marcellus Scott, MD, [place], [facility]. [facility]    To contact the attending provider between 7A-7P or the covering provider during after hours 7P-7A, please log into the web site [email or web address] and access using universal [place]  password for that web site. If you do not have the password, please call the hospital operator.  12/31/2019, 4:34 PM

## 2019-12-31 NOTE — Progress Notes (Signed)
Nutrition Follow-up  RD working remotely.  DOCUMENTATION CODES:   Non-severe (moderate) malnutrition in context of social or environmental circumstances  INTERVENTION:  - continue Ensure Enlive TID.  NUTRITION DIAGNOSIS:   Moderate Malnutrition related to social / environmental circumstances (polysubstance abuse, homelessness) as evidenced by mild fat depletion, moderate muscle depletion, energy intake < 75% for > or equal to 3 months. 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  Patient admitted with significant bilateral leg swelling, shortness of breath, positional back pain.  Chest x-ray was concerning for septic emboli and possibly superimposed pneumonia.  He has been eating mainly 100% of meals over the past 3 days and has been accepting Ensure ~75% of the time offered since order was placed on 10/15.   Weight has been fluctuating throughout hospitalization.    Labs reviewed; BUN: 21 mg/dl, Alk Phos elevated.  Medications reviewed; 20 mg oral pepcid/day, 12.5 mg aldactone/day.    Diet Order:   Diet Order            Diet regular Room service appropriate? Yes; Fluid consistency: Thin  Diet effective now                 EDUCATION NEEDS:   Education needs have been addressed  Skin:  Skin Assessment: Reviewed RN Assessment  Last BM:  10/19  Height:   Ht Readings from Last 1 Encounters:  12/18/19 5' 8" (1.727 m)    Weight:   Wt Readings from Last 1 Encounters:  12/30/19 61.8 kg     Estimated Nutritional Needs:  Kcal:  1950-2150 Protein:   105-115g Fluid:  2L/day      , MS, RD, LDN, CNSC Inpatient Clinical Dietitian RD pager # available in AMION  After hours/weekend pager # available in AMION  

## 2020-01-01 ENCOUNTER — Inpatient Hospital Stay: Payer: Self-pay

## 2020-01-01 LAB — CREATININE, SERUM
Creatinine, Ser: 0.95 mg/dL (ref 0.61–1.24)
GFR, Estimated: >60 mL/min (ref >60)

## 2020-01-01 NOTE — Progress Notes (Signed)
Pharmacy Antibiotic Note  Alex [name] is a 34 y.o. male with hx IVDA admitted on 12/14/2019 with MRSA endocarditis w/ septic emboli (BCx from [facility] where patient recently left AMA, but was admitted to [facility] in Oct 2020 for same). He's currently on vancomycin for MRSA endocarditis w/ septic emboli.  Today, 01/01/2020: -Day #19 of vancomycin - afeb - SCr stable at 0.95 - Trough 19 mcg/ml on 10/19, at goal 15-20 Trough drawn 30 minutes early so true trough is likely closer to 18  Plan: - Continue vancomycin to 1g IV q12h  - Plans to treat 8 weeks from 10/10>12/5, subject to change based on imaging - Plan to recheck trough as renal function indicates or every 5-7 days if stable - Monitor clinical course, renal function, cultures as available  ______________________________________ Height: 5\' 8"  (172.7 cm) Weight: 59.3 kg (130 lb 11.7 oz) IBW/kg (Calculated) : 68.4  Temp (24hrs), Avg:98.1 F (36.7 C), Min:97.6 F (36.4 C), Max:98.5 F (36.9 C)  Recent Labs  Lab 12/27/19 0902 12/29/19 0455  WBC 17.9* 11.2*  CREATININE  --  0.98  VANCOTROUGH  --  19    Estimated Creatinine Clearance: 89.1 mL/min (by C-G formula based on SCr of 0.98 mg/dL).    No Known Allergies  Antimicrobials this admission: 10/4 vancomycin >>   Dose adjustments/levels this admission:  10/7 0500 = 9  on vanc 750mg  q12>> inc to 1500 mg IV q12h  10/11 0100 VT = 21 on vanc 1500mg  q12, decr 1g q12h 10/14 0515 VT 17 on vanc 1000mg  IV q12h - continue  10/19 0455 VT 19 on vanc 1000mg  IV q12h - continue   Microbiology results: BCx pending from [facility] hospital attempting to get records 10/4 BCx: 3 of 4 MRSA 10/6 BCx: staph aureus in 1 set - MRSA 10/10 repeat BCx: NGF  Thank you for allowing pharmacy to be a part of this patient's care.  11/19, PharmD, BCPS 01/01/2020 7:49 AM

## 2020-01-01 NOTE — Progress Notes (Signed)
PROGRESS NOTE   TAEJON [name]  GUY:[id number]    DOB: 1985-07-26    DOA: 12/14/2019  PCP: Alex [name], No Pcp Per   I have briefly reviewed patients previous medical records in [facility].  Chief Complaint  Alex [name] presents with  . Fatigue    Brief Narrative:  34 year old male with PMH of IVDU, history of MRSA endocarditis in 2020, alcohol abuse, polysubstance abuse, depression, tobacco use, recently admitted at [facility] in [place] where he was found to have MRSA bacteremia and endocarditis, left AMA and readmitted here feeling poorly.  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  Stable.   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  Blood cultures positive on admission.  Repeat blood cultures 12/20/2019: Negative.  CT surgeon Dr. Cliffton Asters was consulted and he felt that endocarditis appears to be chronic, Alex [name] will not benefit from Angiomax.  Alex [name] also not a candidate for tricuspid valve repair replacement due to ongoing IVDA.  He will need repeat MRI of lumbar spine in 3 to 4 weeks around the first week of November. IV infiltrated 10/21, removed and PICC line ordered after d/w ID MD on call. Advised topical cold compress.  Bilateral upper extremity rash: Resolved.  Iron deficiency  anemia Continue oral iron and vitamin C.  S/p 1 unit PRBC this admission.  Hemoglobin stable in the 8 g range.  Follow CBC periodically.  Acute on chronic right ventricular failure with severe tricuspid regurgitation: IV Lasix discontinued.  Continue low-dose Aldactone.  Clinically euvolemic.?  Start oral Lasix as well.  Polysubstance abuse: UDS positive for amphetamines and opiates.  Chronic pain syndrome Pain currently controlled on the regimen below.  No changes today.  Scrotal swelling: Ultrasound of Doppler negative for acute abnormality.  Likely third spacing from hypoalbuminemia.  Prior hospitalist discussed with urology who recommended conservative measures and scrotal support.  Seems to have resolved.  Hypoalbuminemia Due to acute illness.  Dietary supplements.  Dietitian consulted.  Hepatitis C infection: Not treated.  Outpatient follow-up with ID.  Transaminitis: Likely from passive hepatic congestion.  Improved.  Body mass index is 19.88 kg/m.  Nutritional Status Nutrition Problem: Moderate Malnutrition Etiology: social / environmental circumstances (polysubstance abuse, homelessness) Signs/Symptoms: mild fat depletion, moderate muscle depletion, energy intake < 75% for > or equal to 3 months Interventions: Ensure Enlive (each supplement provides 350kcal and 20 grams of protein), Magic cup, Education  DVT prophylaxis: enoxaparin (LOVENOX) injection 40 mg Start: 12/15/19 2200 Place and maintain sequential compression device Start: 12/14/19 1121     Code Status: Full Code Family Communication:  Disposition:  Status is: Inpatient  Remains inpatient appropriate because:IV treatments appropriate due to intensity of illness or inability to take PO   Dispo: The Alex [name] is from: Home              Anticipated d/c is to: Home  Anticipated d/c date is: > 3 days               Alex [name] currently is not medically stable to d/c.        Consultants:    Infectious disease Cardiothoracic surgery  Procedures:   None  Antimicrobials:    Anti-infectives (From admission, onward)   Start     Dose/Rate Route Frequency Ordered Stop   12/25/19 1815  vancomycin (VANCOCIN) IVPB 1000 mg/200 mL premix        1,000 mg 100 mL/hr over 120 Minutes Intravenous Every 12 hours 12/25/19 1804     12/21/19 1800  vancomycin (VANCOCIN) IVPB 1000 mg/200 mL premix  Status:  Discontinued        1,000 mg 200 mL/hr over 60 Minutes Intravenous Every 12 hours 12/21/19 1004 12/25/19 1804   12/17/19 1400  vancomycin (VANCOREADY) IVPB 1500 mg/300 mL  Status:  Discontinued        1,500 mg 150 mL/hr over 120 Minutes Intravenous Every 12 hours 12/17/19 1227 12/21/19 1004   12/14/19 1800  vancomycin (VANCOREADY) IVPB 750 mg/150 mL  Status:  Discontinued        750 mg 150 mL/hr over 60 Minutes Intravenous Every 12 hours 12/14/19 1103 12/17/19 1227   12/14/19 0900  vancomycin (VANCOCIN) IVPB 1000 mg/200 mL premix        1,000 mg 200 mL/hr over 60 Minutes Intravenous  Once 12/14/19 0821 12/14/19 1142   12/14/19 0830  piperacillin-tazobactam (ZOSYN) IVPB 3.375 g  Status:  Discontinued        3.375 g 12.5 mL/hr over 240 Minutes Intravenous  Once 12/14/19 0821 12/14/19 0855        Subjective:  Reports Left forearm pain and swelling d/t IV infiltration which was removed. No other complaints reported.  Objective:   Vitals:   12/31/19 2307 01/01/20 0528 01/01/20 0535 01/01/20 1419  BP: 120/85  117/75 117/80  Pulse: 96  97 (!) 108  Resp: 14  14 18   Temp: 97.6 F (36.4 C)  98.2 F (36.8 C) 98.1 F (36.7 C)  TempSrc: Oral  Oral Oral  SpO2: 96%  98% 98%  Weight:  59.3 kg    Height:        General exam: Young male, moderately built and nourished lying comfortably supine in bed. Respiratory system: Clear to auscultation. Respiratory effort normal. Cardiovascular system: S1 & S2 heard, RRR. No JVD, murmurs, rubs, gallops or clicks. No pedal edema.  Telemetry  personally reviewed: Sinus rhythm.  Discontinued telemetry 10/21. Gastrointestinal system: Abdomen is nondistended, soft and nontender. No organomegaly or masses felt. Normal bowel sounds heard. Central nervous system: Alert and oriented. No focal neurological deficits. Extremities: Symmetric 5 x 5 power. Left ventral forearm with mild swelling and tenderness, no redness or streaks. Skin: No rashes, lesions or ulcers Psychiatry: Judgement and insight appear normal. Mood & affect appropriate.     Data Reviewed:   I have personally reviewed following labs and imaging studies   CBC: Recent Labs  Lab 12/27/19 0902 12/29/19 0455  WBC 17.9* 11.2*  NEUTROABS 14.6*  --   HGB 8.3* 8.7*  HCT 27.1* 28.9*  MCV 90.9 92.0  PLT 338 292    Basic Metabolic Panel: Recent Labs  Lab 12/29/19 0455 01/01/20 0835  NA 138  --   K 4.9  --   CL 103  --   CO2 28  --   GLUCOSE 96  --   BUN 21*  --  CREATININE 0.98 0.95  CALCIUM 9.0  --     Liver Function Tests: Recent Labs  Lab 12/29/19 0455  AST 16  ALT 17  ALKPHOS 209*  BILITOT 0.8  PROT 8.2*  ALBUMIN 2.2*    CBG: No results for input(s): GLUCAP in the last 168 hours.  Microbiology Studies:  No results found for this or any previous visit (from the past 240 hour(s)).   Radiology Studies:  [place] EKG SITE RITE  Result Date: 01/01/2020 If Site Rite image not attached, placement could not be confirmed due to current cardiac rhythm.    Scheduled Meds:   . enoxaparin (LOVENOX) injection  40 mg Subcutaneous Q24H  . famotidine  20 mg Oral Daily  . feeding supplement  237 mL Oral TID BM  . ferrous fumarate-b12-vitamic C-folic acid  1 capsule Oral BID PC  . gabapentin  400 mg Oral TID  . hydrOXYzine  25 mg Oral TID  . morphine  15 mg Oral Q12H  . multivitamin with minerals  1 tablet Oral Daily  . naproxen  500 mg Oral TID WC  . sodium chloride flush  3 mL Intravenous Q12H  . spironolactone  12.5 mg Oral Daily  . traZODone   50 mg Oral QHS    Continuous Infusions:   . sodium chloride 250 mL (12/25/19 1321)  . lactated ringers 10 mL/hr at 12/18/19 1227  . vancomycin 1,000 mg (01/01/20 0527)     LOS: 18 days     Marcellus Scott, MD, [place], [facility]. [facility]    To contact the attending provider between 7A-7P or the covering provider during after hours 7P-7A, please log into the web site [email or web address] and access using universal [place] password for that web site. If you do not have the password, please call the hospital operator.  01/01/2020, 5:33 PM

## 2020-01-02 NOTE — Progress Notes (Signed)
[facility]    Date of Admission:  12/14/2019   Total days of antibiotics 4           Abx: 10/04-c vancomycin 1gram q12hr; 10/19 trough 19   A/p: 34 y.o. 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  10/04 and 10/06 bcx positive MRSA (vanc mic 0.5); 10/10 first negative but pending final report 10/08 last documented fever Moderate pleural effusion on mri thoracic spine but only small on 10/12 chest ct  CT surgery evaluated not candidate for AngioVac. Future TV valve repair will likely be needed given TV unable to coapt  Other issues: Chronic hep c - will plan to tx outpatient  Hep b serology negative - will need vaccination given risk for hiv/chronic hep b Last hiv screen 12/2018 negative  ----- Tolerating vancomycin Previous rash related to tapes on extremities, improved with non-latex tape    -continue vancomycin per pharmacy -at least 8 weeks abx needed starting 10/10 until 12/05, but a moving target based on repeat imaging -plan 6 weeks of the 8 weeks iv, and potentially depending on MRI finding could finish with PO medication -will need repeat mri scan lumbar area in 3-4 week (around first week November) -weekly cbc, cmp, crp, vanc trough  -ID will sign off, please call us again after mri is done  Principal Problem:   Endocarditis Active Problems:   Polysubstance dependence (HCC)   IVDU (intravenous drug user)   Acute CHF (congestive heart failure) (HCC)   Anemia   Acute septic pulmonary embolism (HCC)   Goals of care, counseling/discussion    Palliative care encounter   Chronic hepatitis C without hepatic coma (HCC)   Infective myositis   Pleural effusion, bilateral   Malnutrition of moderate degree    Subjective: Doing well No n/v/diarrhea No rash  Medications:  . enoxaparin (LOVENOX) injection  40 mg Subcutaneous Q24H  . famotidine  20 mg Oral Daily  . feeding supplement  237 mL Oral TID BM  . ferrous fumarate-b12-vitamic C-folic acid  1 capsule Oral BID PC  . gabapentin  400 mg Oral TID  . hydrOXYzine  25 mg Oral TID  . morphine  15 mg Oral Q12H  . multivitamin with minerals  1 tablet Oral Daily  . naproxen  500 mg Oral TID WC  . sodium chloride flush  3 mL Intravenous Q12H  . spironolactone  12.5 mg Oral Daily  . traZODone  50 mg Oral QHS    Objective: Vital signs in last 24 hours: Temp:  [97.6 F (36.4 C)-98.1 F (36.7 C)] 97.8 F (36.6 C) (10/23 0526) Pulse Rate:  [91-108] 94 (10/23 0526) Resp:  [14-18] 14 (10/23 0526) BP: (106-117)/(76-81) 106/76 (10/23 0526) SpO2:  [98 %-99 %] 99 % (10/23 0526)  Physical Exam  Constitutional: mild distress; conversant; cooperative HEENT: Oropharynx is clear; per; conj clear; eomi CV: RRR, systolic murmur left sternal border Neck = elevated JVP to angle of jaw Pulmonary; normal respiratory effort; clear lungs Abdominal: Soft, nt Neurological: nonfocal Skin: no new rash Psychiatric: alert/oriented   Lab Results Recent Labs    01/01/20 [phone]  CREATININE 0.95   Liver Panel No results for input(s): PROT, ALBUMIN, AST, ALT, ALKPHOS, BILITOT, BILIDIR, IBILI in the last 72 hours. Sedimentation Rate No results for input(s): ESRSEDRATE in the last 72 hours.  C-Reactive Protein No results for input(s): CRP in the last 72 hours.  Microbiology: 10/10 bcx negative 10/6 blood cx + 10/4 blood cx + (MRSA vanc mic 0.5; S tetra, bactrim)  Imaging: 10/12 chest ct 1. There are numerous bilateral pulmonary nodules throughout the lungs, the largest in the right  pulmonary upper lobe measuring 2.4 x 2.1 cm. There are several larger nodules demonstrating subtle cavitation. Findings are consistent with septic emboli. 2. Small bilateral pleural effusions and associated atelectasis or consolidation. 3. Prominent mediastinal lymph nodes, likely reactive.  10/08 cxr Small bilateral pleural effusion Unchanged diffuse heterogenous nodular and patchy opacity both lungs   10/08 mr thoracolumbar 1. No evidence for osteomyelitis discitis or septic arthritis within the thoracic or lumbar spine. No epidural abscess or other intraspinal infection. 2. Diffuse edema throughout the lower lumbar posterior paraspinous musculature, with similar changes along the posterior margins of the psoas musculature bilaterally. Findings suggestive of acute myositis, likely infectious in nature. No loculated soft tissue collections or abscess. 3. Large irregular bilateral pleural effusions with associated cystic/cavitary changes within the partially visualized lungs, consistent with known history of septic emboli. 4. Shallow left subarticular to foraminal disc protrusion at L5-S1 with resultant mild left foraminal and left lateral recess stenosis.  Tonita [facility] Cell: [phone] Pager: [phone]  01/02/2020, 11:43 AM

## 2020-01-02 NOTE — Progress Notes (Signed)
Alex [name], Alex [name] Per   I have briefly reviewed patients previous medical records in [facility].  Chief Complaint  [name] presents with  . Fatigue    Brief Narrative:  34 year old male with PMH of [name], Alex of MRSA endocarditis in 2020, alcohol abuse, polysubstance abuse, depression, tobacco use, recently admitted at [facility] in [place] where he was found to have MRSA bacteremia and endocarditis, left AMA and readmitted here feeling poorly.  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  Stable.   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  Of this, they recommend 6 weeks of the 8 weeks to be given IV, and potentially depending on MRI finding could finish with p.o. antibiotics.  Blood cultures positive on admission.  Repeat blood cultures 12/20/2019: Negative. CT surgeon Dr. Cliffton Asters was consulted and he felt that endocarditis appears to be chronic, [name] will not benefit from Angiomax.  [name] also not a candidate for tricuspid valve repair replacement due to ongoing IVDA.  He will need repeat MRI of  lumbar spine in 3 to 4 weeks around the first week of November. IV infiltrated 10/21, PICC team was consulted however they were hesitant to place it since he has IVDA and Alex of leaving AMA and recommended to use PICC as a last resort.  They were able to find a peripheral IV line for continued antibiotics.  Bilateral upper extremity rash: Rash suspected to be related to tapes on extremities, resolved with nonlatex tape.  Iron deficiency anemia Continue oral iron and vitamin C.  S/p 1 unit PRBC this admission.  Hemoglobin stable in the 8 g range.  Follow CBC periodically.  Acute on chronic right ventricular failure with severe tricuspid regurgitation: IV Lasix discontinued.  Continue low-dose Aldactone.  Clinically euvolemic on Aldactone alone.  Polysubstance abuse: UDS positive for amphetamines and opiates.  Chronic pain syndrome Pain currently controlled on the regimen below.  Alex changes today.  Scrotal swelling: Ultrasound of Doppler negative for acute abnormality.  Likely third spacing from hypoalbuminemia.  Prior hospitalist discussed with urology who recommended conservative measures and scrotal support.  Seems to have resolved.  Hypoalbuminemia Due to acute illness.  Dietary supplements.  Dietitian consulted.  Hepatitis C infection: Not treated.  Outpatient follow-up with ID.  Transaminitis: Likely from passive hepatic congestion.  Improved.  Body mass index is 19.88 kg/m.  Nutritional Status Nutrition Problem: Moderate Malnutrition Etiology: social / environmental circumstances (polysubstance abuse, homelessness) Signs/Symptoms: mild fat depletion, moderate muscle depletion, energy intake < 75% for > or equal to 3 months Interventions: Ensure Enlive (each supplement provides 350kcal and 20 grams of protein), Magic cup, Education  DVT prophylaxis: enoxaparin (LOVENOX) injection 40 mg  Start: 12/15/19 2200 Place and maintain sequential compression device Start: 12/14/19  1121     Code Status: Full Code Family Communication:  Disposition:  Status is: Inpatient  Remains inpatient appropriate because:IV treatments appropriate due to intensity of illness or inability to take PO   Dispo: The [name] is from: Home              Anticipated d/c is to: Home              Anticipated d/c date is: > 3 days               [name] currently is not medically stable to d/c.        Consultants:   Infectious disease Cardiothoracic surgery  Procedures:   None  Antimicrobials:    Anti-infectives (From admission, onward)   Start     Dose/Rate Route Frequency Ordered Stop   12/25/19 1815  vancomycin (VANCOCIN) IVPB 1000 mg/200 mL premix        1,000 mg 100 mL/hr over 120 Minutes Intravenous Every 12 hours 12/25/19 1804     12/21/19 1800  vancomycin (VANCOCIN) IVPB 1000 mg/200 mL premix  Status:  Discontinued        1,000 mg 200 mL/hr over 60 Minutes Intravenous Every 12 hours 12/21/19 1004 12/25/19 1804   12/17/19 1400  vancomycin (VANCOREADY) IVPB 1500 mg/300 mL  Status:  Discontinued        1,500 mg 150 mL/hr over 120 Minutes Intravenous Every 12 hours 12/17/19 1227 12/21/19 1004   12/14/19 1800  vancomycin (VANCOREADY) IVPB 750 mg/150 mL  Status:  Discontinued        750 mg 150 mL/hr over 60 Minutes Intravenous Every 12 hours 12/14/19 1103 12/17/19 1227   12/14/19 0900  vancomycin (VANCOCIN) IVPB 1000 mg/200 mL premix        1,000 mg 200 mL/hr over 60 Minutes Intravenous  Once 12/14/19 0821 12/14/19 1142   12/14/19 0830  piperacillin-tazobactam (ZOSYN) IVPB 3.375 g  Status:  Discontinued        3.375 g 12.5 mL/hr over 240 Minutes Intravenous  Once 12/14/19 0821 12/14/19 0855        Subjective:  Left forearm pain and swelling due to IV infiltration are better today.  Has new peripheral IV on right forearm.  Objective:   Vitals:   01/01/20 0535 01/01/20 1419 01/01/20 1948 01/02/20 0526  BP: 117/75 117/80 110/81 106/76  Pulse: 97 (!) 108 91 94    Resp: 14 18 14 14   Temp: 98.2 F (36.8 C) 98.1 F (36.7 C) 97.6 F (36.4 C) 97.8 F (36.6 C)  TempSrc: Oral Oral Oral Oral  SpO2: 98% 98% 98% 99%  Weight:      Height:        General exam: Young male, moderately built and nourished lying comfortably supine in bed. Respiratory system: Clear to auscultation. Respiratory effort normal. Cardiovascular system: S1 & S2 heard, RRR. Alex JVD, murmurs, rubs, gallops or clicks. Alex pedal edema.  Telemetry personally reviewed: Sinus rhythm.  Discontinued telemetry 10/21. Gastrointestinal system: Abdomen is nondistended, soft and nontender. Alex organomegaly or masses felt. Normal bowel sounds heard. Central nervous system: Alert and oriented. Alex focal neurological deficits. Extremities: Symmetric 5 x 5 power. Left ventral forearm with mild swelling and tenderness, Alex redness or streaks, improved compared to yesterday.  Skin: Alex rashes, lesions or ulcers Psychiatry: Judgement and insight appear normal. Mood & affect appropriate.     Data Reviewed:  I have personally reviewed following labs and imaging studies   CBC: Recent Labs  Lab 12/27/19 0902 12/29/19 0455  WBC 17.9* 11.2*  NEUTROABS 14.6*  --   HGB 8.3* 8.7*  HCT 27.1* 28.9*  MCV 90.9 92.0  PLT 338 292    Basic Metabolic Panel: Recent Labs  Lab 12/29/19 0455 01/01/20 0835  NA 138  --   K 4.9  --   CL 103  --   CO2 28  --   GLUCOSE 96  --   BUN 21*  --   CREATININE 0.98 0.95  CALCIUM 9.0  --     Liver Function Tests: Recent Labs  Lab 12/29/19 0455  AST 16  ALT 17  ALKPHOS 209*  BILITOT 0.8  PROT 8.2*  ALBUMIN 2.2*    CBG: Alex results for input(s): GLUCAP in the last 168 hours.  Microbiology Studies:  Alex results found for this or any previous visit (from the past 240 hour(s)).   Radiology Studies:  [place] EKG SITE RITE  Result Date: 01/01/2020 If Site Rite image not attached, placement could not be confirmed due to current cardiac rhythm.    Scheduled  Meds:   . enoxaparin (LOVENOX) injection  40 mg Subcutaneous Q24H  . famotidine  20 mg Oral Daily  . feeding supplement  237 mL Oral TID BM  . ferrous fumarate-b12-vitamic C-folic acid  1 capsule Oral BID PC  . gabapentin  400 mg Oral TID  . hydrOXYzine  25 mg Oral TID  . morphine  15 mg Oral Q12H  . multivitamin with minerals  1 tablet Oral Daily  . naproxen  500 mg Oral TID WC  . sodium chloride flush  3 mL Intravenous Q12H  . spironolactone  12.5 mg Oral Daily  . traZODone  50 mg Oral QHS    Continuous Infusions:   . sodium chloride 250 mL (12/25/19 1321)  . lactated ringers 10 mL/hr at 12/18/19 1227  . vancomycin 1,000 mg (01/02/20 0555)     LOS: 19 days     Marcellus Scott, MD, [place], [facility]    To contact the attending provider between 7A-7P or the covering provider during after hours 7P-7A, please log into the web site [email or web address] and access using universal [place] password for that web site. If you do not have the password, please call the hospital operator.  01/02/2020, 11:51 AM

## 2020-01-02 NOTE — Progress Notes (Signed)
°   01/02/20 1526  Assess: MEWS Score  Temp 99.2 F (37.3 C)  BP (!) 104/58  Pulse Rate (!) 117  Resp 16  SpO2 95 %  Assess: MEWS Score  MEWS Temp 0  MEWS Systolic 0  MEWS Pulse 2  MEWS RR 0  MEWS LOC 0  MEWS Score 2  MEWS Score Color Yellow  Assess: if the MEWS score is Yellow or Red  Were vital signs taken at a resting state? Yes  Focused Assessment No change from prior assessment  Early Detection of Sepsis Score *See Row Information* Medium  MEWS guidelines implemented *See Row Information* Yes  Take Vital Signs  Increase Vital Sign Frequency  Yellow: Q 2hr X 2 then Q 4hr X 2, if remains yellow, continue Q 4hrs  Escalate  MEWS: Escalate Yellow: discuss with charge nurse/RN and consider discussing with provider and RRT  Notify: Charge Nurse/RN  Name of Charge Nurse/RN Notified myself  Notify: Provider  Provider Name/Title Honhgalgi  Date Provider Notified 01/02/20  Time Provider Notified 1545  Notification Type Page  Notification Reason Other (Comment)  Response No new orders  Date of Provider Response 01/02/20  Time of Provider Response 1550  Document  Patient Outcome Other (Comment)  Progress note created (see row info) Yes  MD notified, no new orders. VS rechecked, see flow sheet at 1623. Will continue to monitor.

## 2020-01-03 LAB — CBC WITH DIFFERENTIAL/PLATELET
Abs Immature Granulocytes: 0.09 10*3/uL — ABNORMAL HIGH (ref 0.00–0.07)
Basophils Absolute: 0.1 10*3/uL (ref 0.0–0.1)
Basophils Relative: 1 %
Eosinophils Absolute: 0.3 10*3/uL (ref 0.0–0.5)
Eosinophils Relative: 2 %
HCT: 29.2 % — ABNORMAL LOW (ref 39.0–52.0)
Hemoglobin: 8.9 g/dL — ABNORMAL LOW (ref 13.0–17.0)
Immature Granulocytes: 1 %
Lymphocytes Relative: 18 %
Lymphs Abs: 2.6 10*3/uL (ref 0.7–4.0)
MCH: 27.1 pg (ref 26.0–34.0)
MCHC: 30.5 g/dL (ref 30.0–36.0)
MCV: 89 fL (ref 80.0–100.0)
Monocytes Absolute: 1 10*3/uL (ref 0.1–1.0)
Monocytes Relative: 7 %
Neutro Abs: 10.2 10*3/uL — ABNORMAL HIGH (ref 1.7–7.7)
Neutrophils Relative %: 71 %
Platelets: 248 10*3/uL (ref 150–400)
RBC: 3.28 MIL/uL — ABNORMAL LOW (ref 4.22–5.81)
RDW: 17.6 % — ABNORMAL HIGH (ref 11.5–15.5)
WBC: 14.2 10*3/uL — ABNORMAL HIGH (ref 4.0–10.5)
nRBC: 0 % (ref 0.0–0.2)

## 2020-01-03 LAB — COMPREHENSIVE METABOLIC PANEL
ALT: 14 U/L (ref 0–44)
AST: 15 U/L (ref 15–41)
Albumin: 2.5 g/dL — ABNORMAL LOW (ref 3.5–5.0)
Alkaline Phosphatase: 202 U/L — ABNORMAL HIGH (ref 38–126)
Anion gap: 10 (ref 5–15)
BUN: 25 mg/dL — ABNORMAL HIGH (ref 6–20)
CO2: 24 mmol/L (ref 22–32)
Calcium: 9 mg/dL (ref 8.9–10.3)
Chloride: 101 mmol/L (ref 98–111)
Creatinine, Ser: 1.11 mg/dL (ref 0.61–1.24)
GFR, Estimated: >60 mL/min (ref >60)
Glucose, Bld: 113 mg/dL — ABNORMAL HIGH (ref 70–99)
Potassium: 4.3 mmol/L (ref 3.5–5.1)
Sodium: 135 mmol/L (ref 135–145)
Total Bilirubin: 0.7 mg/dL (ref 0.3–1.2)
Total Protein: 8.5 g/dL — ABNORMAL HIGH (ref 6.5–8.1)

## 2020-01-03 LAB — C-REACTIVE PROTEIN: CRP: 16 mg/dL — ABNORMAL HIGH (ref <1.0)

## 2020-01-03 NOTE — Progress Notes (Signed)
Patient has new rash to BLEs (patient has had for 1 day he reports). He also reports rash is getting worse. It covers his lower feet and extends to the back of his thighs. Patient reports that it does itch and it is dark purple/red and macular in nature.  MD and pharmacist were notified just now regarding this rash. Vancomycin will be pushed back (per pharmacy) until am, so that patient can be evaluated before receiving another dose. Priscille Kluver

## 2020-01-03 NOTE — Progress Notes (Addendum)
PROGRESS NOTE   Alex [name]  GMW:[id number]    DOB: 03/30/1985    DOA: 12/14/2019  PCP: Patient, No Pcp Per   I have briefly reviewed patients previous medical records in [facility].  Chief Complaint  Patient presents with  . Fatigue    Brief Narrative:  34 year old male with PMH of IVDU, history of MRSA endocarditis in 2020, alcohol abuse, polysubstance abuse, depression, tobacco use, recently admitted at [facility] in [place] where he was found to have MRSA bacteremia and endocarditis, left AMA and readmitted here feeling poorly.  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  Stable.   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  Of this, they recommend 6 weeks of the 8 weeks to be given IV, and potentially depending on MRI finding could finish with p.o. antibiotics.  Blood cultures positive on admission.  Repeat blood cultures 12/20/2019: Negative. CT surgeon Dr. Cliffton Asters was consulted and he felt that endocarditis appears to be chronic, patient will not benefit from Angiomax.  Patient also not a candidate for tricuspid valve repair replacement due to ongoing IVDA.  He will need repeat MRI of  lumbar spine in 3 to 4 weeks around the first week of November. IV infiltrated 10/21, PICC team was consulted however they were hesitant to place it since he has IVDA and history of leaving AMA and recommended to use PICC as a last resort.  They were able to find a peripheral IV line for continued antibiotics. Had transient low-grade fever yesterday, leukocytosis slightly worse from 11 > 14 and CRP up from 9.6 > 16.  Continue current course.  Bilateral upper extremity rash: Rash suspected to be related to tapes on extremities, resolved with nonlatex tape.  Iron deficiency anemia Continue oral iron and vitamin C.  S/p 1 unit PRBC this admission.  Hemoglobin stable in the 8 g range.  Follow CBC periodically.  Acute on chronic right ventricular failure with severe tricuspid regurgitation: IV Lasix discontinued.  Continue low-dose Aldactone.  Clinically euvolemic on Aldactone alone.  Polysubstance abuse: UDS positive for amphetamines and opiates.  Chronic pain syndrome Pain currently controlled on the regimen below.  No changes today.  Scrotal swelling: Ultrasound of Doppler negative for acute abnormality.  Likely third spacing from hypoalbuminemia.  Prior hospitalist discussed with urology who recommended conservative measures and scrotal support.  Seems to have resolved.  Hypoalbuminemia Due to acute illness.  Dietary supplements.  Dietitian consulted.  Hepatitis C infection: Not treated.  Outpatient follow-up with ID.  Transaminitis: Likely from passive hepatic congestion.  Improved.  Body mass index is 19.88 kg/m.  Nutritional Status Nutrition Problem: Moderate Malnutrition Etiology: social / environmental circumstances (polysubstance abuse, homelessness) Signs/Symptoms: mild fat depletion, moderate muscle depletion, energy intake < 75% for > or equal to 3 months  Interventions: Ensure Enlive (each supplement provides 350kcal and 20 grams of protein), Magic cup, Education  DVT  prophylaxis: enoxaparin (LOVENOX) injection 40 mg Start: 12/15/19 2200 Place and maintain sequential compression device Start: 12/14/19 1121     Code Status: Full Code Family Communication:  Disposition:  Status is: Inpatient  Remains inpatient appropriate because:IV treatments appropriate due to intensity of illness or inability to take PO   Dispo: The patient is from: Home              Anticipated d/c is to: Home              Anticipated d/c date is: > 3 days               Patient currently is not medically stable to d/c.        Consultants:   Infectious disease Cardiothoracic surgery  Procedures:   None  Antimicrobials:    Anti-infectives (From admission, onward)   Start     Dose/Rate Route Frequency Ordered Stop   12/25/19 1815  vancomycin (VANCOCIN) IVPB 1000 mg/200 mL premix        1,000 mg 100 mL/hr over 120 Minutes Intravenous Every 12 hours 12/25/19 1804     12/21/19 1800  vancomycin (VANCOCIN) IVPB 1000 mg/200 mL premix  Status:  Discontinued        1,000 mg 200 mL/hr over 60 Minutes Intravenous Every 12 hours 12/21/19 1004 12/25/19 1804   12/17/19 1400  vancomycin (VANCOREADY) IVPB 1500 mg/300 mL  Status:  Discontinued        1,500 mg 150 mL/hr over 120 Minutes Intravenous Every 12 hours 12/17/19 1227 12/21/19 1004   12/14/19 1800  vancomycin (VANCOREADY) IVPB 750 mg/150 mL  Status:  Discontinued        750 mg 150 mL/hr over 60 Minutes Intravenous Every 12 hours 12/14/19 1103 12/17/19 1227   12/14/19 0900  vancomycin (VANCOCIN) IVPB 1000 mg/200 mL premix        1,000 mg 200 mL/hr over 60 Minutes Intravenous  Once 12/14/19 0821 12/14/19 1142   12/14/19 0830  piperacillin-tazobactam (ZOSYN) IVPB 3.375 g  Status:  Discontinued        3.375 g 12.5 mL/hr over 240 Minutes Intravenous  Once 12/14/19 0821 12/14/19 0855        Subjective:  States that he feels fine and denies complaints.  Objective:   Vitals:   01/02/20 1748 01/02/20 1941 01/02/20 2350  01/03/20 0558  BP: 108/74 110/70 117/74 100/70  Pulse: (!) 106 93 89 96  Resp: (!) 22 16 14 14   Temp: 99.4 F (37.4 C) 99.1 F (37.3 C) 98.2 F (36.8 C) 98.1 F (36.7 C)  TempSrc: Oral Oral Oral Oral  SpO2: 97% 96% 98% 99%  Weight:      Height:       No new findings.  General exam: Young male, moderately built and nourished lying comfortably supine in bed. Respiratory system: Clear to auscultation. Respiratory effort normal. Cardiovascular system: S1 & S2 heard, RRR. No JVD, murmurs, rubs, gallops or clicks. No pedal edema.  Telemetry personally reviewed: Sinus rhythm.  Discontinued telemetry 10/21. Gastrointestinal system: Abdomen is nondistended, soft and nontender. No organomegaly or masses felt. Normal bowel sounds heard. Central nervous system: Alert and oriented. No focal neurological deficits. Extremities: Symmetric 5 x 5 power. Left ventral forearm with mild swelling and tenderness, no redness or streaks, improved compared to yesterday.  Skin: No rashes, lesions or ulcers Psychiatry: Judgement and insight  appear normal. Mood & affect appropriate.     Data Reviewed:   I have personally reviewed following labs and imaging studies   CBC: Recent Labs  Lab 12/29/19 0455 01/03/20 0541  WBC 11.2* 14.2*  NEUTROABS  --  10.2*  HGB 8.7* 8.9*  HCT 28.9* 29.2*  MCV 92.0 89.0  PLT 292 248    Basic Metabolic Panel: Recent Labs  Lab 12/29/19 0455 01/01/20 0835 01/03/20 0541  NA 138  --  135  K 4.9  --  4.3  CL 103  --  101  CO2 28  --  24  GLUCOSE 96  --  113*  BUN 21*  --  25*  CREATININE 0.98 0.95 1.11  CALCIUM 9.0  --  9.0    Liver Function Tests: Recent Labs  Lab 12/29/19 0455 01/03/20 0541  AST 16 15  ALT 17 14  ALKPHOS 209* 202*  BILITOT 0.8 0.7  PROT 8.2* 8.5*  ALBUMIN 2.2* 2.5*    CBG: No results for input(s): GLUCAP in the last 168 hours.  Microbiology Studies:  No results found for this or any previous visit (from the past 240  hour(s)).   Radiology Studies:  No results found.   Scheduled Meds:   . enoxaparin (LOVENOX) injection  40 mg Subcutaneous Q24H  . famotidine  20 mg Oral Daily  . feeding supplement  237 mL Oral TID BM  . ferrous fumarate-b12-vitamic C-folic acid  1 capsule Oral BID PC  . gabapentin  400 mg Oral TID  . hydrOXYzine  25 mg Oral TID  . morphine  15 mg Oral Q12H  . multivitamin with minerals  1 tablet Oral Daily  . naproxen  500 mg Oral TID WC  . sodium chloride flush  3 mL Intravenous Q12H  . spironolactone  12.5 mg Oral Daily  . traZODone  50 mg Oral QHS    Continuous Infusions:   . sodium chloride 250 mL (12/25/19 1321)  . lactated ringers 10 mL/hr at 12/18/19 1227  . vancomycin 1,000 mg (01/03/20 0649)     LOS: 20 days     Marcellus Scott, MD, [place], [facility]. [facility]    To contact the attending provider between 7A-7P or the covering provider during after hours 7P-7A, please log into the web site [email or web address] and access using universal [place] password for that web site. If you do not have the password, please call the hospital operator.  01/03/2020, 3:40 PM

## 2020-01-04 DIAGNOSIS — R21 Rash and other nonspecific skin eruption: Secondary | ICD-10-CM

## 2020-01-04 LAB — CREATININE, SERUM
Creatinine, Ser: 1.22 mg/dL (ref 0.61–1.24)
GFR, Estimated: >60 mL/min (ref >60)

## 2020-01-04 LAB — CK: Total CK: 12 U/L — ABNORMAL LOW (ref 49–397)

## 2020-01-04 MED ORDER — SODIUM CHLORIDE 0.9 % IV SOLN
500.0000 mg | Freq: Every day | INTRAVENOUS | Status: DC
  Administered 2020-01-04 – 2020-01-18 (×15): 500 mg via INTRAVENOUS
  Filled 2020-01-04 (×15): qty 10

## 2020-01-04 NOTE — Progress Notes (Signed)
Pharmacy Antibiotic Note  Alex [name] is a 34 y.o. male admitted on 12/14/2019 with MRSA endocarditis w/ septic emboli (BCx from [facility] where patient recently left AMA, but was admitted to [facility] in Oct 2020 for same). Patient has been on vancomycin for 3 weeks and now has developed a new rash on his lower extremities covering his feet and extending to the back of his thighs. Thought to be secondary to vancomycin. ID recommends transition to daptomycin to continue therapy for endocarditis.   Plan:  Stop vancomycin   Start Daptomycin 500 mg (~8mg /kg)   Plans to treat 8 weeks from 10/10>12/5, subject to change based on imaging  Baseline CK and then weekly   Monitor clinical course, renal function, cultures as available   Height: 5\' 8"  (172.7 cm) Weight: 59.3 kg (130 lb 11.7 oz) IBW/kg (Calculated) : 68.4  Temp (24hrs), Avg:98.4 F (36.9 C), Min:98.2 F (36.8 C), Max:98.5 F (36.9 C)  Recent Labs  Lab 12/29/19 0455 01/01/20 0835 01/03/20 0541 01/04/20 0547  WBC 11.2*  --  14.2*  --   CREATININE 0.98 0.95 1.11 1.22  VANCOTROUGH 19  --   --   --     Estimated Creatinine Clearance: 71.6 mL/min (by C-G formula based on SCr of 1.22 mg/dL).    No Known Allergies  10/4 vancomycin >> 10/24 10/25 daptomycin >>  Dose adjustments/levels this admission:  10/7 0500 = 9  on vanc 750mg  q12>> inc to 1500 mg IV q12h  10/11 0100 VT = 21 on vanc 1500mg  q12, decr 1g q12h 10/14 0515 VT 17 on vanc 1000mg  IV q12h - continue  10/19 0455 VT 19 on vanc 1000mg  IV q12h - continue   Microbiology results: BCx pending from [facility] hospital attempting to get records 10/4 BCx: 3 of 4 MRSA 10/6 BCx: staph aureus in 1 set - MRSA 10/10 repeat BCx: NGF  Thank you for allowing pharmacy to be a part of this patient's care.  11/19, PharmD Infectious Disease Pharmacist  Phone: [phone] 01/04/2020 10:46 AM

## 2020-01-04 NOTE — Progress Notes (Signed)
PROGRESS NOTE   DIO GILLER  ZWC:[id number]    DOB: 09/02/1985    DOA: 12/14/2019  PCP: Alex [name], No Pcp Per   I have briefly reviewed patients previous medical records in [facility].  Chief Complaint  Alex [name] presents with  . Fatigue    Brief Narrative:  34 year old male with PMH of IVDU, history of MRSA endocarditis in 2020, alcohol abuse, polysubstance abuse, depression, tobacco use, recently admitted at [facility] in [place] where he was found to have MRSA bacteremia and endocarditis, left AMA and readmitted here feeling poorly.  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  Developed new rash 10/25 and IV vancomycin switched to daptomycin.   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  Of this, they recommend 6 weeks of the 8 weeks to be given IV, and potentially depending on MRI finding could finish with p.o. antibiotics.  Blood cultures positive on admission.  Repeat blood cultures 12/20/2019: Negative. CT surgeon Dr. Cliffton Asters was consulted and he felt that endocarditis appears to be chronic, Alex [name] will not benefit from Angiomax.  Alex [name] also not a candidate for tricuspid valve repair  replacement due to ongoing IVDA.  He will need repeat MRI of lumbar spine in 3 to 4 weeks around the first week of November. IV infiltrated 10/21, PICC team was consulted however they were hesitant to place it since he has IVDA and history of leaving AMA and recommended to use PICC as a last resort.  They were able to find a peripheral IV line for continued antibiotics. Had transient low-grade fever yesterday, leukocytosis slightly worse from 11 > 14 and CRP up from 9.6 > 16.  IV vancomycin changed to daptomycin on 10/25, secondary to suspected allergic skin rash from vancomycin.  Discussed with Dr. Drue Second, ID  Allergic skin rash Initial upper extremity rash suspected to be related to tapes on extremities, resolved with nonlatex tape.  However on 10/25, developed new onset pruritic skin rash on his legs extending up to his thighs, felt to be due to vancomycin which was changed over to daptomycin.  Monitor.  Iron deficiency anemia Continue oral iron and vitamin C.  S/p 1 unit PRBC this admission.  Hemoglobin stable in the 8 g range.  Follow CBC periodically.  Acute on chronic right ventricular failure with severe tricuspid regurgitation: IV Lasix discontinued.  Continue low-dose Aldactone.  Clinically euvolemic on Aldactone alone.  Polysubstance abuse: UDS positive for amphetamines and opiates.  Chronic pain syndrome Pain currently controlled on the regimen below.  No changes today.  Scrotal swelling: Ultrasound of Doppler negative for acute abnormality.  Likely third spacing from hypoalbuminemia.  Prior hospitalist discussed with urology who recommended conservative measures and scrotal support.  Seems to have resolved.  Hypoalbuminemia Due to acute illness.  Dietary supplements.  Dietitian  consulted.  Hepatitis C infection: Not treated.  Outpatient follow-up with ID.  Transaminitis: Likely from passive hepatic congestion.  Improved.  Body mass index is 19.88 kg/m.  Nutritional  Status Nutrition Problem: Moderate Malnutrition Etiology: social / environmental circumstances (polysubstance abuse, homelessness) Signs/Symptoms: mild fat depletion, moderate muscle depletion, energy intake < 75% for > or equal to 3 months Interventions: Ensure Enlive (each supplement provides 350kcal and 20 grams of protein), Magic cup, Education  DVT prophylaxis: enoxaparin (LOVENOX) injection 40 mg Start: 12/15/19 2200 Place and maintain sequential compression device Start: 12/14/19 1121     Code Status: Full Code Family Communication:  Disposition:  Status is: Inpatient  Remains inpatient appropriate because:IV treatments appropriate due to intensity of illness or inability to take PO   Dispo: The Alex [name] is from: Home              Anticipated d/c is to: Home              Anticipated d/c date is: > 3 days               Alex [name] currently is not medically stable to d/c.        Consultants:   Infectious disease Cardiothoracic surgery  Procedures:   None  Antimicrobials:    Anti-infectives (From admission, onward)   Start     Dose/Rate Route Frequency Ordered Stop   01/04/20 2000  DAPTOmycin (CUBICIN) 500 mg in sodium chloride 0.9 % IVPB        500 mg 220 mL/hr over 30 Minutes Intravenous Daily 01/04/20 1045     12/25/19 1815  vancomycin (VANCOCIN) IVPB 1000 mg/200 mL premix  Status:  Discontinued        1,000 mg 100 mL/hr over 120 Minutes Intravenous Every 12 hours 12/25/19 1804 01/04/20 0954   12/21/19 1800  vancomycin (VANCOCIN) IVPB 1000 mg/200 mL premix  Status:  Discontinued        1,000 mg 200 mL/hr over 60 Minutes Intravenous Every 12 hours 12/21/19 1004 12/25/19 1804   12/17/19 1400  vancomycin (VANCOREADY) IVPB 1500 mg/300 mL  Status:  Discontinued        1,500 mg 150 mL/hr over 120 Minutes Intravenous Every 12 hours 12/17/19 1227 12/21/19 1004   12/14/19 1800  vancomycin (VANCOREADY) IVPB 750 mg/150 mL  Status:  Discontinued        750 mg 150 mL/hr over  60 Minutes Intravenous Every 12 hours 12/14/19 1103 12/17/19 1227   12/14/19 0900  vancomycin (VANCOCIN) IVPB 1000 mg/200 mL premix        1,000 mg 200 mL/hr over 60 Minutes Intravenous  Once 12/14/19 0821 12/14/19 1142   12/14/19 0830  piperacillin-tazobactam (ZOSYN) IVPB 3.375 g  Status:  Discontinued        3.375 g 12.5 mL/hr over 240 Minutes Intravenous  Once 12/14/19 0821 12/14/19 0855        Subjective:  Overnight events noted.  Ongoing itching of his legs with new onset rash.  States that he literally could see the new rashes, on as vancomycin was going last night.  Objective:   Vitals:   01/03/20 0558 01/03/20 2119 01/04/20 0612 01/04/20 1246  BP: 100/70 (!) 134/95 104/70 108/80  Pulse: 96 (!) 107 (!) 101 98  Resp: 14 17 17 16   Temp: 98.1 F (36.7 C) 98.5 F (36.9 C) 98.2 F (36.8 C) 98 F (36.7 C)  TempSrc: Oral Oral Oral Oral  SpO2: 99% 97% 97% 100%  Weight:  Height:       No new findings.  General exam: Young male, moderately built and nourished lying comfortably supine in bed. Respiratory system: Clear to auscultation. Respiratory effort normal. Cardiovascular system: S1 & S2 heard, RRR. No JVD, murmurs, rubs, gallops or clicks. No pedal edema.  Telemetry personally reviewed: Sinus rhythm.  Discontinued telemetry 10/21. Gastrointestinal system: Abdomen is nondistended, soft and nontender. No organomegaly or masses felt. Normal bowel sounds heard. Central nervous system: Alert and oriented. No focal neurological deficits. Extremities: Symmetric 5 x 5 power.  Scars of previous cubital fossa area rash from tape.  Now has diffuse petechial rash on his lower extremities extending to his knees, has pants hence unable to examine thighs but indicates that has made it back of thighs.  No rash seen on trunk, face or upper extremities. Skin: No rashes, lesions or ulcers Psychiatry: Judgement and insight appear normal. Mood & affect appropriate.     Data Reviewed:   I  have personally reviewed following labs and imaging studies   CBC: Recent Labs  Lab 12/29/19 0455 01/03/20 0541  WBC 11.2* 14.2*  NEUTROABS  --  10.2*  HGB 8.7* 8.9*  HCT 28.9* 29.2*  MCV 92.0 89.0  PLT 292 248    Basic Metabolic Panel: Recent Labs  Lab 12/29/19 0455 12/29/19 0455 01/01/20 0835 01/03/20 0541 01/04/20 0547  NA 138  --   --  135  --   K 4.9  --   --  4.3  --   CL 103  --   --  101  --   CO2 28  --   --  24  --   GLUCOSE 96  --   --  113*  --   BUN 21*  --   --  25*  --   CREATININE 0.98   < > 0.95 1.11 1.22  CALCIUM 9.0  --   --  9.0  --    < > = values in this interval not displayed.    Liver Function Tests: Recent Labs  Lab 12/29/19 0455 01/03/20 0541  AST 16 15  ALT 17 14  ALKPHOS 209* 202*  BILITOT 0.8 0.7  PROT 8.2* 8.5*  ALBUMIN 2.2* 2.5*    CBG: No results for input(s): GLUCAP in the last 168 hours.  Microbiology Studies:  No results found for this or any previous visit (from the past 240 hour(s)).   Radiology Studies:  No results found.   Scheduled Meds:   . enoxaparin (LOVENOX) injection  40 mg Subcutaneous Q24H  . famotidine  20 mg Oral Daily  . feeding supplement  237 mL Oral TID BM  . ferrous fumarate-b12-vitamic C-folic acid  1 capsule Oral BID PC  . gabapentin  400 mg Oral TID  . hydrOXYzine  25 mg Oral TID  . morphine  15 mg Oral Q12H  . multivitamin with minerals  1 tablet Oral Daily  . naproxen  500 mg Oral TID WC  . sodium chloride flush  3 mL Intravenous Q12H  . spironolactone  12.5 mg Oral Daily  . traZODone  50 mg Oral QHS    Continuous Infusions:   . sodium chloride 250 mL (12/25/19 1321)  . DAPTOmycin (CUBICIN)  IV    . lactated ringers 10 mL/hr at 12/18/19 1227     LOS: 21 days     Marcellus Scott, MD, [place], [facility]. [facility]    To contact the attending provider between 7A-7P or the covering provider  during after hours 7P-7A, please log into the web site [email or web address] and access  using universal [place] password for that web site. If you do not have the password, please call the hospital operator.  01/04/2020, 5:14 PM

## 2020-01-04 NOTE — Progress Notes (Signed)
[facility]    Date of Admission:  12/14/2019     ID: Alex [name] is a 34 y.o. male with   Principal Problem:   Endocarditis Active Problems:   Polysubstance dependence (HCC)   IVDU (intravenous drug user)   Acute CHF (congestive heart failure) (HCC)   Anemia   Acute septic pulmonary embolism (HCC)   Goals of care, counseling/discussion   Palliative care encounter   Chronic hepatitis C without hepatic coma (HCC)   Infective myositis   Pleural effusion, bilateral   Malnutrition of moderate degree    Subjective: Afebrile, but noticed having a stabbing like pain to right chest wall  Medications:  . enoxaparin (LOVENOX) injection  40 mg Subcutaneous Q24H  . famotidine  20 mg Oral Daily  . feeding supplement  237 mL Oral TID BM  . ferrous fumarate-b12-vitamic C-folic acid  1 capsule Oral BID PC  . gabapentin  400 mg Oral TID  . hydrOXYzine  25 mg Oral TID  . morphine  15 mg Oral Q12H  . multivitamin with minerals  1 tablet Oral Daily  . naproxen  500 mg Oral TID WC  . sodium chloride flush  3 mL Intravenous Q12H  . spironolactone  12.5 mg Oral Daily  . traZODone  50 mg Oral QHS    Objective: Vital signs in last 24 hours: Temp:  [98 F (36.7 C)-98.5 F (36.9 C)] 98 F (36.7 C) (10/25 1246) Pulse Rate:  [98-107] 98 (10/25 1246) Resp:  [16-17] 16 (10/25 1246) BP: (104-134)/(70-95) 108/80 (10/25 1246) SpO2:  [97 %-100 %] 100 % (10/25 1246) Physical Exam  Constitutional: He is oriented to person, place, and time. He appears well-developed and well-nourished. No distress.  HENT:  Mouth/Throat: Oropharynx is clear and moist. No oropharyngeal exudate.  Cardiovascular: Normal rate, regular rhythm and normal heart sounds. Exam reveals no gallop and no friction rub.  No murmur heard.  Pulmonary/Chest: Effort normal and breath sounds normal. No respiratory distress. He has no wheezes.  Abdominal: Soft. Bowel sounds are normal. He exhibits no  distension. There is no tenderness.  Lymphadenopathy:  He has no cervical adenopathy.  Neurological: He is alert and oriented to person, place, and time.  Skin: Skin is warm and dry. +petechail rash to legs only Psychiatric: He has a normal mood and affect. His behavior is normal.     Lab Results Recent Labs    01/03/20 0541 01/04/20 0547  WBC 14.2*  --   HGB 8.9*  --   HCT 29.2*  --   NA 135  --   K 4.3  --   CL 101  --   CO2 24  --   BUN 25*  --   CREATININE 1.11 1.22   Liver Panel Recent Labs    01/03/20 0541  PROT 8.5*  ALBUMIN 2.5*  AST 15  ALT 14  ALKPHOS 202*  BILITOT 0.7   C-Reactive Protein Recent Labs    01/03/20 0541  CRP 16.0*    Microbiology: reviewed Studies/Results: No results found.   Assessment/Plan: Petechial rash to lower legs bilaterally = unclear if all related to drug rash or inpart also due to endocarditis. We will plan to change to daptomycin to finish out the 6 wk ( 3 wks)  Right sided chest wall pain = recommend to get CXR   MRSA bacteremia/TV endocarditis = will be on daptomycin  Pulmonary cavitary lesions = has received 3 wk which should suffice.  But will check CXR to see if any effusion to compare to admitting images  [facility] Cell: [phone] Pager: [phone]  01/04/2020, 5:27 PM

## 2020-01-05 ENCOUNTER — Inpatient Hospital Stay (HOSPITAL_COMMUNITY): Payer: Self-pay

## 2020-01-05 LAB — COMPREHENSIVE METABOLIC PANEL
ALT: 16 U/L (ref 0–44)
AST: 18 U/L (ref 15–41)
Albumin: 2.6 g/dL — ABNORMAL LOW (ref 3.5–5.0)
Alkaline Phosphatase: 224 U/L — ABNORMAL HIGH (ref 38–126)
Anion gap: 11 (ref 5–15)
BUN: 24 mg/dL — ABNORMAL HIGH (ref 6–20)
CO2: 23 mmol/L (ref 22–32)
Calcium: 9 mg/dL (ref 8.9–10.3)
Chloride: 102 mmol/L (ref 98–111)
Creatinine, Ser: 1.23 mg/dL (ref 0.61–1.24)
GFR, Estimated: >60 mL/min (ref >60)
Glucose, Bld: 109 mg/dL — ABNORMAL HIGH (ref 70–99)
Potassium: 4.5 mmol/L (ref 3.5–5.1)
Sodium: 136 mmol/L (ref 135–145)
Total Bilirubin: 0.8 mg/dL (ref 0.3–1.2)
Total Protein: 9 g/dL — ABNORMAL HIGH (ref 6.5–8.1)

## 2020-01-05 LAB — CBC
HCT: 29.5 % — ABNORMAL LOW (ref 39.0–52.0)
Hemoglobin: 8.8 g/dL — ABNORMAL LOW (ref 13.0–17.0)
MCH: 26.4 pg (ref 26.0–34.0)
MCHC: 29.8 g/dL — ABNORMAL LOW (ref 30.0–36.0)
MCV: 88.6 fL (ref 80.0–100.0)
Platelets: 250 10*3/uL (ref 150–400)
RBC: 3.33 MIL/uL — ABNORMAL LOW (ref 4.22–5.81)
RDW: 17.7 % — ABNORMAL HIGH (ref 11.5–15.5)
WBC: 13.6 10*3/uL — ABNORMAL HIGH (ref 4.0–10.5)
nRBC: 0 % (ref 0.0–0.2)

## 2020-01-05 NOTE — Progress Notes (Signed)
   01/05/20 1445  Assess: MEWS Score  Temp (!) 101.1 F (38.4 C)  BP 114/74  Pulse Rate (!) 114  Resp 18  Level of Consciousness Alert  SpO2 95 %  O2 Device Room Air  Patient Activity (if Appropriate) In bed  Assess: if the MEWS score is Yellow or Red  Were vital signs taken at a resting state? Yes  Focused Assessment Change from prior assessment (see assessment flowsheet)  Early Detection of Sepsis Score *See Row Information* Medium  MEWS guidelines implemented *See Row Information* Yes  Escalate  MEWS: Escalate Yellow: discuss with charge nurse/RN and consider discussing with provider and RRT  Notify: Charge Nurse/RN  Name of Charge Nurse/RN Notified [email or web address]  Date Charge Nurse/RN Notified 01/05/20  Time Charge Nurse/RN Notified 1450  Notify: Provider  Provider Name/Title Dr  Waymon Amato  Date Provider Notified 01/05/20  Time Provider Notified 1450  Notification Type Page  Notification Reason Change in status  Response See new orders  Date of Provider Response 01/05/20  Time of Provider Response 1500  Document  Patient Outcome Stabilized after interventions

## 2020-01-05 NOTE — Progress Notes (Signed)
PROGRESS NOTE   Alex [name]  NOM:[id number]    DOB: 04-09-1985    DOA: 12/14/2019  PCP: Patient, No Pcp Per   I have briefly reviewed patients previous medical records in [facility].  Chief Complaint  Patient presents with  . Fatigue    Brief Narrative:  34 year old male with PMH of IVDU, history of MRSA endocarditis in 2020, alcohol abuse, polysubstance abuse, depression, tobacco use, recently admitted at [facility] in [place] where he was found to have MRSA bacteremia and endocarditis, left AMA and readmitted here feeling poorly.  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  Developed new rash 10/25 and IV vancomycin switched to daptomycin.  Fever again on 10/26, obtained blood cultures.   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  Of this, they recommend 6 weeks of the 8 weeks to be given IV, and potentially depending on MRI finding could finish with p.o. antibiotics.  Blood cultures positive on admission.  Repeat blood cultures 12/20/2019: Negative. CT surgeon Dr. Cliffton Asters was consulted and he felt that endocarditis appears to be chronic, patient will not benefit from Angiomax.  Patient also  not a candidate for tricuspid valve repair replacement due to ongoing IVDA.  He will need repeat MRI of lumbar spine in 3 to 4 weeks around the first week of November. IV infiltrated 10/21, PICC team was consulted however they were hesitant to place it since he has IVDA and history of leaving AMA and recommended to use PICC as a last resort.  They were able to find a peripheral IV line for continued antibiotics. IV vancomycin changed to daptomycin on 10/25, secondary to suspected allergic skin rash from vancomycin.  Febrile/101.1 F on 10/26.  Chest x-ray 10/26: Slight interval improvement of numerous nodular and patchy airspace opacities bilaterally compatible with known multifocal septic emboli, pleural effusions resolved.  Repeated blood cultures.  Does have mild leukocytosis and CRP actually recently went up. Right sided scapular area localized pain appears musculoskeletal in nature. ID on board already.  Allergic petechial rash to bilateral lower extremities Initial upper extremity rash suspected to be related to tapes on extremities, resolved with nonlatex tape.  However on 10/25, developed new onset pruritic skin rash on his legs extending up to his thighs, felt to be due to vancomycin which was changed over to daptomycin.  Other DD: Related to endocarditis itself.  Rash and pruritus somewhat better today.  Iron deficiency anemia Continue oral iron and vitamin C.  S/p 1 unit PRBC this admission.  Hemoglobin stable in the 8 g range.  Follow CBC periodically.  Acute on chronic right ventricular failure with severe tricuspid regurgitation: IV Lasix discontinued.  Continue low-dose Aldactone.  Clinically euvolemic on Aldactone alone.  Polysubstance abuse: UDS positive for amphetamines and opiates.  Chronic pain syndrome  Pain currently controlled on the regimen below.  No change in regimen for the last week  Scrotal swelling: Ultrasound of Doppler negative for acute abnormality.  Likely third  spacing from hypoalbuminemia.  Prior hospitalist discussed with urology who recommended conservative measures and scrotal support.  Seems to have resolved.  Hypoalbuminemia Due to acute illness.  Dietary supplements.  Dietitian consulted.  Hepatitis C infection: Not treated.  Outpatient follow-up with ID.  Transaminitis: Likely from passive hepatic congestion.  Resolved  Body mass index is 20.11 kg/m.  Nutritional Status Nutrition Problem: Moderate Malnutrition Etiology: social / environmental circumstances (polysubstance abuse, homelessness) Signs/Symptoms: mild fat depletion, moderate muscle depletion, energy intake < 75% for > or equal to 3 months Interventions: Ensure Enlive (each supplement provides 350kcal and 20 grams of protein), Magic cup, Education  DVT prophylaxis: enoxaparin (LOVENOX) injection 40 mg Start: 12/15/19 2200 Place and maintain sequential compression device Start: 12/14/19 1121     Code Status: Full Code Family Communication: Discussed in detail with patient's father at bedside.  Updated care and answered questions. Disposition:  Status is: Inpatient  Remains inpatient appropriate because:IV treatments appropriate due to intensity of illness or inability to take PO   Dispo: The patient is from: Home              Anticipated d/c is to: Home              Anticipated d/c date is: > 3 days               Patient currently is not medically stable to d/c.        Consultants:   Infectious disease Cardiothoracic surgery  Procedures:   None  Antimicrobials:    Anti-infectives (From admission, onward)   Start     Dose/Rate Route Frequency Ordered Stop   01/04/20 2000  DAPTOmycin (CUBICIN) 500 mg in sodium chloride 0.9 % IVPB        500 mg 220 mL/hr over 30 Minutes Intravenous Daily 01/04/20 1045     12/25/19 1815  vancomycin (VANCOCIN) IVPB 1000 mg/200 mL premix  Status:  Discontinued        1,000 mg 100 mL/hr over 120 Minutes Intravenous Every 12  hours 12/25/19 1804 01/04/20 0954   12/21/19 1800  vancomycin (VANCOCIN) IVPB 1000 mg/200 mL premix  Status:  Discontinued        1,000 mg 200 mL/hr over 60 Minutes Intravenous Every 12 hours 12/21/19 1004 12/25/19 1804   12/17/19 1400  vancomycin (VANCOREADY) IVPB 1500 mg/300 mL  Status:  Discontinued        1,500 mg 150 mL/hr over 120 Minutes Intravenous Every 12 hours 12/17/19 1227 12/21/19 1004   12/14/19 1800  vancomycin (VANCOREADY) IVPB 750 mg/150 mL  Status:  Discontinued        750 mg 150 mL/hr over 60 Minutes Intravenous Every 12 hours 12/14/19 1103 12/17/19 1227   12/14/19 0900  vancomycin (VANCOCIN) IVPB 1000 mg/200 mL premix        1,000 mg 200 mL/hr over 60 Minutes Intravenous  Once 12/14/19 0821 12/14/19 1142   12/14/19 0830  piperacillin-tazobactam (ZOSYN) IVPB 3.375 g  Status:  Discontinued        3.375 g 12.5 mL/hr over 240 Minutes Intravenous  Once 12/14/19 0821 12/14/19 0855        Subjective:  Father at bedside.  Reports lower extremity rash and pruritus are somewhat better.  Not as intense looking as yesterday.  Reports localized area of  right mid back pain, worse with deep inspiration or chest wall movements.  Objective:   Vitals:   01/04/20 1246 01/04/20 2135 01/05/20 0517 01/05/20 1445  BP: 108/80 109/70 109/82 114/74  Pulse: 98 100 (!) 101 (!) 114  Resp: 16 17 16 18   Temp: 98 F (36.7 C) 98.4 F (36.9 C) 98.1 F (36.7 C) (!) 101.1 F (38.4 C)  TempSrc: Oral Oral Oral Oral  SpO2: 100% 92% 97% 95%  Weight:   60 kg   Height:        General exam: Young male, moderately built and nourished seen ambulating comfortably around his hospital bed. Respiratory system: Clear to auscultation. Respiratory effort normal. Cardiovascular system: S1 and S2 heard, RRR.  No JVD, murmurs or pedal edema. Gastrointestinal system: Abdomen is nondistended, soft and nontender. No organomegaly or masses felt. Normal bowel sounds heard. Central nervous system: Alert and  oriented. No focal neurological deficits. Extremities: Symmetric 5 x 5 power.  Scars of previous cubital fossa area rash from tape.  Now has diffuse petechial rash on his lower extremities extending to his knees, has pants hence unable to examine thighs but indicates that has made it back of thighs.  No rash seen on trunk, face or upper extremities.  Rash is definitely fainter compared to yesterday. Skin: No rashes, lesions or ulcers Psychiatry: Judgement and insight appear normal. Mood & affect appropriate.     Data Reviewed:   I have personally reviewed following labs and imaging studies   CBC: Recent Labs  Lab 01/03/20 0541 01/05/20 0522  WBC 14.2* 13.6*  NEUTROABS 10.2*  --   HGB 8.9* 8.8*  HCT 29.2* 29.5*  MCV 89.0 88.6  PLT 248 250    Basic Metabolic Panel: Recent Labs  Lab 01/03/20 0541 01/04/20 0547 01/05/20 0522  NA 135  --  136  K 4.3  --  4.5  CL 101  --  102  CO2 24  --  23  GLUCOSE 113*  --  109*  BUN 25*  --  24*  CREATININE 1.11 1.22 1.23  CALCIUM 9.0  --  9.0    Liver Function Tests: Recent Labs  Lab 01/03/20 0541 01/05/20 0522  AST 15 18  ALT 14 16  ALKPHOS 202* 224*  BILITOT 0.7 0.8  PROT 8.5* 9.0*  ALBUMIN 2.5* 2.6*    CBG: No results for input(s): GLUCAP in the last 168 hours.  Microbiology Studies:  No results found for this or any previous visit (from the past 240 hour(s)).   Radiology Studies:  DG Chest 2 View  Result Date: 01/05/2020 CLINICAL DATA:  Septic emboli EXAM: CHEST - 2 VIEW COMPARISON:  12/22/2019, 12/18/2019 FINDINGS: The heart size and mediastinal contours are within normal limits. Numerous nodular and patchy airspace opacities scattered throughout both lungs compatible with known multifocal septic emboli, slightly improved from prior. Interval resolution of the previously seen bilateral pleural effusions. No pneumothorax. The visualized skeletal structures are unremarkable. IMPRESSION: 1. Numerous nodular and patchy  airspace opacities scattered throughout both lungs compatible with known multifocal septic emboli, with slight interval improved from prior. 2. Interval resolution of previously seen bilateral pleural effusions. Electronically Signed   By: 02/17/2020 D.O.   On: 01/05/2020 08:42     Scheduled Meds:   . enoxaparin (LOVENOX) injection  40 mg Subcutaneous Q24H  . famotidine  20 mg Oral Daily  . feeding supplement  237 mL Oral TID BM  . ferrous fumarate-b12-vitamic C-folic acid  1 capsule  Oral BID PC  . gabapentin  400 mg Oral TID  . hydrOXYzine  25 mg Oral TID  . morphine  15 mg Oral Q12H  . multivitamin with minerals  1 tablet Oral Daily  . naproxen  500 mg Oral TID WC  . sodium chloride flush  3 mL Intravenous Q12H  . spironolactone  12.5 mg Oral Daily  . traZODone  50 mg Oral QHS    Continuous Infusions:   . sodium chloride 250 mL (12/25/19 1321)  . DAPTOmycin (CUBICIN)  IV 500 mg (01/04/20 1954)  . lactated ringers 10 mL/hr at 12/18/19 1227     LOS: 22 days     Marcellus ScottAnand Ichiro Chesnut, MD, [place]FACP, [facility] Center At Miami BeachFHM. [facility]    To contact the attending provider between 7A-7P or the covering provider during after hours 7P-7A, please log into the web site [email or web address] and access using universal [place] password for that web site. If you do not have the password, please call the hospital operator.  01/05/2020, 3:36 PM

## 2020-01-06 NOTE — Progress Notes (Signed)
PROGRESS NOTE  Alex [name] UMP:[id number] DOB: 1985/04/13 DOA: 12/14/2019 PCP: Patient, No Pcp Per   LOS: 23 days   Brief Narrative / Interim history: 34 year old male with history of IVDU, MRSA endocarditis 2020, alcohol abuse, polysubstance abuse, depression, recently admitted to [facility] in [place] where he was found to have MRSA bacteremia and endocarditis, left AMA and readmitted here.  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  Subjective / 24h Interval events: Doing well, no complaints, still has chest pain with deep breathing but no significant shortness of breath  Assessment & Plan: Principal Problem Disseminated MRSA infection, tricuspid valve endocarditis, multiple septic pulmonary emboli, paraspinal muscular myositis -Patient started on IV vancomycin, however due to a rash on 10/25 this had to be changed to daptomycin.  ID following -They recommend minimum 6 weeks of IV antibiotics until 12/5 but that depends on repeat imaging and clinical course.  He needs a repeat L-spine MRI around the first week of November to monitor course after 4 weeks of antibiotics, initial MRI was on 10/8 and 4 weeks would be on 11/5 -Cardiothoracic surgery consulted and felt endocarditis appears chronic and patient would not benefit from Angiomax.  He is also not a candidate for tricuspid valve repair due to ongoing drug use. -Had a repeat fever on 10/26, repeat cultures pending, no growth to date  Active Problems Allergic petechial rash to bilateral lower extremities -Possibly due to vancomycin, changed over to daptomycin.  Slightly better.  Iron deficiency anemia -Hemoglobin stable  Acute on chronic RV failure with severe tricuspid regurgitation -On room air, appears euvolemic, continue spironolactone  Polysubstance abuse -Patient will need to quit  Chronic pain syndrome -Stable on the regimen as  below  Scrotal swelling -Doppler negative for acute abnormalities, possibly third spacing from hypoalbuminemia, urology discussed with the prior hospitalist and recommended conservative measures.  Seems to have resolved  Hep C infection -Outpatient ID follow-up  Moderate malnutrition -supplements   Scheduled Meds: . enoxaparin (LOVENOX) injection  40 mg Subcutaneous Q24H  . famotidine  20 mg Oral Daily  . feeding supplement  237 mL Oral TID BM  . ferrous fumarate-b12-vitamic C-folic acid  1 capsule Oral BID PC  . gabapentin  400 mg Oral TID  . hydrOXYzine  25 mg Oral TID  . morphine  15 mg Oral Q12H  . multivitamin with minerals  1 tablet Oral Daily  . naproxen  500 mg Oral TID WC  . sodium chloride flush  3 mL Intravenous Q12H  . spironolactone  12.5 mg Oral Daily  . traZODone  50 mg Oral QHS   Continuous Infusions: . sodium chloride 250 mL (12/25/19 1321)  . DAPTOmycin (CUBICIN)  IV 500 mg (01/05/20 1951)  . lactated ringers 10 mL/hr at 01/06/20 0838   PRN Meds:.sodium chloride, acetaminophen, diphenhydrAMINE, HYDROmorphone (DILAUDID) injection, levalbuterol, LORazepam, nitroGLYCERIN, ondansetron (ZOFRAN) IV, sodium chloride flush  Diet Orders (From admission, onward)    Start     Ordered   12/18/19 1555  Diet regular Room service appropriate? Yes; Fluid consistency: Thin  Diet effective now       Question Answer Comment  Room service appropriate? Yes   Fluid consistency: Thin      12/18/19 1554          DVT prophylaxis: enoxaparin (LOVENOX) injection 40 mg Start: 12/15/19 2200 Place and maintain sequential compression device Start: 12/14/19 1121     Code Status: Full Code  Family Communication:  no family at bedside   Status is: Inpatient  Remains inpatient appropriate because:Inpatient level of care appropriate due to severity of illness   Dispo: The patient is from: Home              Anticipated d/c is to: Home              Anticipated d/c date is: > 3  days              Patient currently is not medically stable to d/c.  Consultants:  ID  Procedures:  2D echo:  IMPRESSIONS  1. Left ventricular ejection fraction, by estimation, is 60 to 65%. The left ventricle has normal function. The left ventricle has no regional wall motion abnormalities. Left ventricular diastolic parameters were normal.  2. Right ventricular systolic function is mildly reduced. The right ventricular size is severely enlarged. There is mildly elevated pulmonary artery systolic pressure.  3. Right atrial size was severely dilated.  4. A small pericardial effusion is present. The pericardial effusion is posterior to the left ventricle.  5. The mitral valve is normal in structure. Trivial mitral valve regurgitation. No evidence of mitral stenosis.  6. Large mobile vegetation on the septal and lateral leaflet with leaflet destruction and failure to coapt leading to wide open TR. The tricuspid valve is abnormal. Tricuspid valve regurgitation is severe.  7. The aortic valve is normal in structure. Aortic valve regurgitation is not visualized. No aortic stenosis is present.  8. The inferior vena cava is dilated in size with <50% respiratory variability, suggesting right atrial pressure of 15 mmHg.   Microbiology  Blood cultures 10/4-Staph aureus  Antimicrobials: IV vancomycin up until 10/25, daptomycin since   Objective: Vitals:   01/05/20 1855 01/05/20 2245 01/06/20 0245 01/06/20 0500  BP: 103/79 98/77 109/66   Pulse: 90 91 93   Resp: 20 20 17    Temp: 98.2 F (36.8 C) 98 F (36.7 C) 97.6 F (36.4 C)   TempSrc: Oral Oral Oral   SpO2: 96% 99% 99%   Weight:    58.1 kg  Height:       No intake or output data in the 24 hours ending 01/06/20 1239 Filed Weights   01/01/20 0528 01/05/20 0517 01/06/20 0500  Weight: 59.3 kg 60 kg 58.1 kg    Examination:  Constitutional: NAD Eyes: no scleral icterus ENMT: Mucous membranes are moist.  Neck: normal,  supple Respiratory: No wheezing, no crackles, shallow respirations but overall clear Cardiovascular: Regular rate and rhythm, 3/6 SEM, no edema Abdomen: non distended, no tenderness. Bowel sounds positive.  Musculoskeletal: no clubbing / cyanosis.  Skin: no rashes Neurologic: CN 2-12 grossly intact. Strength 5/5 in all 4.    Data Reviewed: I have independently reviewed following labs and imaging studies   CBC: Recent Labs  Lab 01/03/20 0541 01/05/20 0522  WBC 14.2* 13.6*  NEUTROABS 10.2*  --   HGB 8.9* 8.8*  HCT 29.2* 29.5*  MCV 89.0 88.6  PLT 248 250   Basic Metabolic Panel: Recent Labs  Lab 01/01/20 0835 01/03/20 0541 01/04/20 0547 01/05/20 0522  NA  --  135  --  136  K  --  4.3  --  4.5  CL  --  101  --  102  CO2  --  24  --  23  GLUCOSE  --  113*  --  109*  BUN  --  25*  --  24*  CREATININE 0.95 1.11 1.22 1.23  CALCIUM  --  9.0  --  9.0   Liver Function Tests: Recent Labs  Lab 01/03/20 0541 01/05/20 0522  AST 15 18  ALT 14 16  ALKPHOS 202* 224*  BILITOT 0.7 0.8  PROT 8.5* 9.0*  ALBUMIN 2.5* 2.6*   Coagulation Profile: No results for input(s): INR, PROTIME in the last 168 hours. HbA1C: No results for input(s): HGBA1C in the last 72 hours. CBG: No results for input(s): GLUCAP in the last 168 hours.  Recent Results (from the past 240 hour(s))  Culture, blood (Routine X 2) w Reflex to ID Panel     Status: None (Preliminary result)   Collection Time: 01/05/20  3:43 PM   Specimen: BLOOD RIGHT HAND  Result Value Ref Range Status   Specimen Description   Final    BLOOD RIGHT HAND Performed at [facility], [address], [place], [place] [address]    Special Requests   Final    BOTTLES DRAWN AEROBIC ONLY Blood Culture adequate volume Performed at [facility], [address] [place], [place] [address]    Culture   Final    NO GROWTH < 12 HOURS Performed at [facility], [address] [place],  [place] [address]    Report Status PENDING  Incomplete  Culture, blood (Routine X 2) w Reflex to ID Panel     Status: None (Preliminary result)   Collection Time: 01/05/20  3:43 PM   Specimen: BLOOD RIGHT HAND  Result Value Ref Range Status   Specimen Description   Final    BLOOD RIGHT HAND Performed at [facility], [address], [place], [place] [address]    Special Requests   Final    BOTTLES DRAWN AEROBIC ONLY Blood Culture adequate volume Performed at [facility], [address], [place], [place] [address]    Culture   Final    NO GROWTH < 12 HOURS Performed at [facility], [address], [place], [place] [address]    Report Status PENDING  Incomplete     Radiology Studies: No results found.   Pamella Pert, MD, PhD [facility]  Between 7 am - 7 pm I am available, please contact me via Amion or Securechat  Between 7 pm - 7 am I am not available, please contact night coverage MD/APP via Amion

## 2020-01-07 LAB — CREATININE, SERUM
Creatinine, Ser: 1.33 mg/dL — ABNORMAL HIGH (ref 0.61–1.24)
GFR, Estimated: >60 mL/min (ref >60)

## 2020-01-07 NOTE — Progress Notes (Signed)
[facility]    Date of Admission:  12/14/2019     ID: Alex [name] is a 34 y.o. male with  MRSA TV endocarditis, pulmonary cavitary septic emboli,myositis, vanco rash Principal Problem:   Endocarditis Active Problems:   Polysubstance dependence (HCC)   IVDU (intravenous drug user)   Acute CHF (congestive heart failure) (HCC)   Anemia   Acute septic pulmonary embolism (HCC)   Goals of care, counseling/discussion   Palliative care encounter   Chronic hepatitis C without hepatic coma (HCC)   Infective myositis   Pleural effusion, bilateral   Malnutrition of moderate degree    Subjective: Afebrile, rash improved, denies shortness of breath  Medications:   enoxaparin (LOVENOX) injection  40 mg Subcutaneous Q24H   famotidine  20 mg Oral Daily   feeding supplement  237 mL Oral TID BM   ferrous fumarate-b12-vitamic C-folic acid  1 capsule Oral BID PC   gabapentin  400 mg Oral TID   hydrOXYzine  25 mg Oral TID   morphine  15 mg Oral Q12H   multivitamin with minerals  1 tablet Oral Daily   naproxen  500 mg Oral TID WC   sodium chloride flush  3 mL Intravenous Q12H   spironolactone  12.5 mg Oral Daily   traZODone  50 mg Oral QHS    Objective: Vital signs in last 24 hours: Temp:  [97.6 F (36.4 C)-98 F (36.7 C)] 98 F (36.7 C) (10/28 1513) Pulse Rate:  [96-108] 108 (10/28 1513) Resp:  [14-18] 14 (10/28 1513) BP: (109-112)/(69-80) 112/80 (10/28 1513) SpO2:  [98 %-100 %] 98 % (10/28 1513) Weight:  [59.3 kg] 59.3 kg (10/28 0651) Physical Exam  Constitutional: He is oriented to person, place, and time. He appears well-developed and well-nourished. No distress.  HENT:  Mouth/Throat: Oropharynx is clear and moist. No oropharyngeal exudate.  Cardiovascular: Normal rate, regular rhythm and normal heart sounds. +murmur Pulmonary/Chest: Effort normal and breath sounds normal. No respiratory distress. He has no wheezes.  Abdominal: Soft.  Bowel sounds are normal. He exhibits no distension. There is no tenderness.  Lymphadenopathy:  He has no cervical adenopathy.  Neurological: He is alert and oriented to person, place, and time.  Skin: Skin is warm and dry. Petechial rash fading on feet bilaterally Psychiatric: He has a normal mood and affect. His behavior is normal.    Lab Results Recent Labs    01/05/20 0522 01/07/20 0438  WBC 13.6*  --   HGB 8.8*  --   HCT 29.5*  --   NA 136  --   K 4.5  --   CL 102  --   CO2 23  --   BUN 24*  --   CREATININE 1.23 1.33*   Liver Panel Recent Labs    01/05/20 0522  PROT 9.0*  ALBUMIN 2.6*  AST 18  ALT 16  ALKPHOS 224*  BILITOT 0.8    Microbiology: reviewed Studies/Results: No results found.   Assessment/Plan: Fever = now resolved, had repeat blood cx still pending  mrsa disseminated infection with endocarditis, pulmonary cavitary pneumonia, and myositis = pneumonia thought to have been finished in treatment with 3 wk of vancomycin. He was then transitioned to daptomycin for the remaining 3 wks to treat endocarditis. He was transitioned due to drug rash from vancomycin, possibly drug fever>? Tolerating daptomycin for the time being.  Drug rash= resolving  [facility] Cell: [phone] Pager: [phone]  01/07/2020, 4:08 PM

## 2020-01-07 NOTE — Progress Notes (Signed)
Initial Nutrition Assessment  DOCUMENTATION CODES:   Non-severe (moderate) malnutrition in context of social or environmental circumstances  INTERVENTION:   -Ensure Enlive po TID, each supplement provides 350 kcal and 20 grams of protein -Magic cup BID with meals, each supplement provides 290 kcal and 9 grams of protein  NUTRITION DIAGNOSIS:   Moderate Malnutrition related to social / environmental circumstances (polysubstance abuse, homelessness) as evidenced by mild fat depletion, moderate muscle depletion, energy intake < 75% for > or equal to 3 months.  Ongoing.  GOAL:   Patient will meet greater than or equal to 90% of their needs  Progressing.  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  Patient admitted with significant bilateral leg swelling, shortness of breath, positional back pain.  Chest x-ray was concerning for septic emboli and possibly superimposed pneumonia.  LOS x 24, expected prolonged admission d/t IV antibiotics, has completed 3 weeks of planned 6.  Last documented intakes were 100% of meals on 10/23. Pt is drinking protein supplements and taking vitamin supplementation.  Admission weight: 119 lbs. Current weight: 130 lbs.  Weight trending back down since 10/18.  I/Os: -8.5L since admit  Medications: FOLTRIN (B-12, iron, Vit C, folic acid), Multivitamin with minerals daily, Lactated ringers  Labs reviewed.  Diet Order:   Diet Order            Diet regular Room service appropriate? Yes; Fluid consistency: Thin  Diet effective now                 EDUCATION NEEDS:   Education needs have been addressed  Skin:  Skin  Assessment: Reviewed RN Assessment  Last BM:  10/19  Height:   Ht Readings from Last 1 Encounters:  12/18/19 5\' 8"  (1.727 m)    Weight:   Wt Readings from Last 1 Encounters:  01/07/20 59.3 kg   BMI:  Body mass index is 19.88 kg/m.  Estimated Nutritional Needs:   Kcal:  1950-2150  Protein:  105-115g  Fluid:  2L/day  01/09/20, MS, RD, LDN Inpatient Clinical Dietitian Contact information available via Amion

## 2020-01-07 NOTE — Progress Notes (Signed)
PROGRESS NOTE  Alex [name] OVZ:[id number] DOB: [birth year] DOA: 12/14/2019 PCP: Patient, No Pcp Per   LOS: 24 days   Brief Narrative / Interim history: 34 year old male with history of IVDU, MRSA endocarditis 2020, alcohol abuse, polysubstance abuse, depression, recently admitted to [facility] in [place] where he was found to have MRSA bacteremia and endocarditis, left AMA and readmitted here.  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  Subjective / 24h Interval events: Complains of a cough, has noted dark brown sputum, concern for bleed  Assessment & Plan: Principal Problem Disseminated MRSA infection, tricuspid valve endocarditis, multiple septic pulmonary emboli, paraspinal muscular myositis -Patient started on IV vancomycin, however due to a rash on 10/25 this had to be changed to daptomycin.  ID following -They recommend minimum 6 weeks of IV antibiotics until 12/5 but that depends on repeat imaging and clinical course.  He needs a repeat L-spine MRI around the first week of November to monitor course after 4 weeks of antibiotics, initial MRI was on 10/8 and 4 weeks would be on 11/5 -Cardiothoracic surgery consulted and felt endocarditis appears chronic and patient would not benefit from Angiomax.  He is also not a candidate for tricuspid valve repair due to ongoing drug use. -Had a repeat fever on 10/26, repeat cultures pending, still no growth to date  Active Problems Allergic petechial rash to bilateral lower extremities -Possibly due to vancomycin, changed over to daptomycin.  Slightly better.  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  Seems to have resolved  Hep C infection -Outpatient ID follow-up  Moderate malnutrition -supplements   Scheduled Meds: . enoxaparin (LOVENOX) injection  40 mg Subcutaneous Q24H  . famotidine  20 mg Oral Daily  . feeding supplement  237 mL Oral TID BM  . ferrous fumarate-b12-vitamic C-folic acid  1 capsule Oral BID PC  . gabapentin  400 mg Oral TID  . hydrOXYzine  25 mg Oral TID  . morphine  15 mg Oral Q12H  . multivitamin with minerals  1 tablet Oral Daily  . naproxen  500 mg Oral TID WC  . sodium chloride flush  3 mL Intravenous Q12H  . spironolactone  12.5 mg Oral Daily  . traZODone  50 mg Oral QHS   Continuous Infusions: . sodium chloride 250 mL (12/25/19 1321)  . DAPTOmycin (CUBICIN)  IV Stopped (01/06/20 2147)  . lactated ringers 10 mL/hr at 01/07/20 0300   PRN Meds:.sodium chloride, acetaminophen, diphenhydrAMINE, HYDROmorphone (DILAUDID) injection, levalbuterol, LORazepam, nitroGLYCERIN, ondansetron (ZOFRAN) IV, sodium chloride flush  Diet Orders (From admission, onward)    Start     Ordered   12/18/19 1555  Diet regular Room service appropriate? Yes; Fluid consistency: Thin  Diet effective now       Question Answer Comment  Room service appropriate? Yes   Fluid consistency: Thin      12/18/19 1554          DVT prophylaxis: enoxaparin (LOVENOX) injection 40 mg Start: 12/15/19 2200 Place and maintain sequential compression device Start: 12/14/19 1121     Code Status: Full Code  Family Communication: no family at bedside  Status is: Inpatient  Remains inpatient appropriate because:Inpatient level of care appropriate due to severity of illness   Dispo: The patient is from: Home              Anticipated d/c is to: Home              Anticipated d/c date is: > 3 days              Patient  currently is not medically stable to d/c.  Consultants:  ID  Procedures:  2D echo:  IMPRESSIONS  1. Left ventricular ejection fraction, by estimation, is 60 to 65%. The left ventricle has normal function. The left ventricle has no regional wall motion abnormalities. Left ventricular diastolic parameters were normal.  2. Right ventricular systolic function is mildly reduced. The right ventricular size is severely enlarged. There is mildly elevated pulmonary artery systolic pressure.  3. Right atrial size was severely dilated.  4. A small pericardial effusion is present. The pericardial effusion is posterior to the left ventricle.  5. The mitral valve is normal in structure. Trivial mitral valve regurgitation. No evidence of mitral stenosis.  6. Large mobile vegetation on the septal and lateral leaflet with leaflet destruction and failure to coapt leading to wide open TR. The tricuspid valve is abnormal. Tricuspid valve regurgitation is severe.  7. The aortic valve is normal in structure. Aortic valve regurgitation is not visualized. No aortic stenosis is present.  8. The inferior vena cava is dilated in size with <50% respiratory variability, suggesting right atrial pressure of 15 mmHg.   Microbiology  Blood cultures 10/4-Staph aureus  Antimicrobials: IV vancomycin up until 10/25, daptomycin since   Objective: Vitals:   01/06/20 1514 01/06/20 2227 01/07/20 0614 01/07/20 0651  BP: 106/72 109/77 111/69   Pulse: (!) 106 96 100   Resp: 14 16 18    Temp: 97.7 F (36.5 C) 97.6 F (36.4 C) 97.7 F (36.5 C)   TempSrc: Oral Oral Oral   SpO2: 99% 100% 98%   Weight:    59.3 kg  Height:        Intake/Output Summary (Last 24 hours) at 01/07/2020 1149 Last data filed at 01/07/2020 0300 Gross per 24 hour  Intake 405.46 ml  Output --  Net 405.46 ml   Filed Weights   01/05/20 0517 01/06/20 0500 01/07/20 0651  Weight: 60 kg 58.1 kg 59.3 kg    Examination:  Constitutional: No  distress, comfortable   Data Reviewed: I have independently reviewed following labs and imaging studies   CBC: Recent Labs  Lab 01/03/20 0541 01/05/20 0522  WBC 14.2* 13.6*  NEUTROABS 10.2*  --   HGB 8.9* 8.8*  HCT 29.2* 29.5*  MCV 89.0 88.6  PLT 248 250   Basic Metabolic Panel: Recent Labs  Lab 01/01/20 0835 01/03/20 0541 01/04/20 0547 01/05/20 0522 01/07/20 0438  NA  --  135  --  136  --   K  --  4.3  --  4.5  --   CL  --  101  --  102  --   CO2  --  24  --  23  --   GLUCOSE  --  113*  --  109*  --   BUN  --  25*  --  24*  --   CREATININE 0.95 1.11 1.22 1.23 1.33*  CALCIUM  --  9.0  --  9.0  --    Liver Function Tests: Recent Labs  Lab 01/03/20 0541  01/05/20 0522  AST 15 18  ALT 14 16  ALKPHOS 202* 224*  BILITOT 0.7 0.8  PROT 8.5* 9.0*  ALBUMIN 2.5* 2.6*   Coagulation Profile: No results for input(s): INR, PROTIME in the last 168 hours. HbA1C: No results for input(s): HGBA1C in the last 72 hours. CBG: No results for input(s): GLUCAP in the last 168 hours.  Recent Results (from the past 240 hour(s))  Culture, blood (Routine X 2) w Reflex to ID Panel     Status: None (Preliminary result)   Collection Time: 01/05/20  3:43 PM   Specimen: BLOOD RIGHT HAND  Result Value Ref Range Status   Specimen Description   Final    BLOOD RIGHT HAND Performed at [facility], [address], [place], [place] [address]    Special Requests   Final    BOTTLES DRAWN AEROBIC ONLY Blood Culture adequate volume Performed at [facility], [address], [place], [place] [address]    Culture   Final    NO GROWTH 2 DAYS Performed at [facility], [address], [place], [place] [address]    Report Status PENDING  Incomplete  Culture, blood (Routine X 2) w Reflex to ID Panel     Status: None (Preliminary result)   Collection Time: 01/05/20  3:43 PM   Specimen: BLOOD RIGHT HAND  Result Value Ref Range Status   Specimen  Description   Final    BLOOD RIGHT HAND Performed at [facility], [address] [place], [place] [address]    Special Requests   Final    BOTTLES DRAWN AEROBIC ONLY Blood Culture adequate volume Performed at [facility], [address] [place], [place] [address]    Culture   Final    NO GROWTH 2 DAYS Performed at [facility], [address], [place], [place] [address]    Report Status PENDING  Incomplete     Radiology Studies: No results found.   Pamella Pert, MD, PhD [facility]  Between 7 am - 7 pm I am available, please contact me via Amion or Securechat  Between 7 pm - 7 am I am not available, please contact night coverage MD/APP via Amion

## 2020-01-08 MED ORDER — SENNOSIDES-DOCUSATE SODIUM 8.6-50 MG PO TABS
2.0000 | ORAL_TABLET | Freq: Two times a day (BID) | ORAL | Status: DC
  Administered 2020-01-08 – 2020-01-19 (×18): 2 via ORAL
  Filled 2020-01-08 (×20): qty 2

## 2020-01-08 NOTE — Progress Notes (Signed)
PROGRESS NOTE  Alex [name] [facility]:[id number] DOB: 1986-03-03 DOA: 12/14/2019 PCP: Patient, No Pcp Per   LOS: 25 days   Brief Narrative / Interim history: 34 year old male with history of IVDU, MRSA endocarditis 2020, alcohol abuse, polysubstance abuse, depression, recently admitted to [facility] in [place] where he was found to have MRSA bacteremia and endocarditis, left AMA and readmitted here.  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  Subjective / 24h Interval events: Has not had any more coughing.  Overall feels well.  Assessment & Plan: Principal Problem Disseminated MRSA infection, tricuspid valve endocarditis, multiple septic pulmonary emboli, paraspinal muscular myositis -Patient started on IV vancomycin, however due to a rash on 10/25 this had to be changed to daptomycin.  ID following -They recommend minimum 6 weeks of IV antibiotics until 12/5 but that depends on repeat imaging and clinical course.  He needs a repeat L-spine MRI around the first week of November to monitor course after 4 weeks of antibiotics, initial MRI was on 10/8 and 4 weeks would be on 11/5 -Cardiothoracic surgery consulted and felt endocarditis appears chronic and patient would not benefit from Angiomax.  He is also not a candidate for tricuspid valve repair due to ongoing drug use. -Had a repeat fever on 10/26, cultures were sent at that time and they are still negative today  Active Problems Allergic petechial rash to bilateral lower extremities -Possibly due to vancomycin, changed over to daptomycin.  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  Seems to have resolved  Hep C infection -Outpatient ID follow-up  Moderate malnutrition -supplements   Scheduled Meds: . enoxaparin (LOVENOX) injection  40 mg Subcutaneous Q24H  . famotidine  20 mg Oral Daily  . feeding supplement  237 mL Oral TID BM  . ferrous fumarate-b12-vitamic C-folic acid  1 capsule Oral BID PC  . gabapentin  400 mg Oral TID  . hydrOXYzine  25 mg Oral TID  . morphine  15 mg Oral Q12H  . multivitamin with minerals  1 tablet Oral Daily  . naproxen  500 mg Oral TID WC  . senna-docusate  2 tablet Oral BID  . sodium chloride flush  3 mL Intravenous Q12H  . spironolactone  12.5 mg Oral Daily  . traZODone  50 mg Oral QHS   Continuous Infusions: . sodium chloride 250 mL (12/25/19 1321)  . DAPTOmycin (CUBICIN)  IV 500 mg (01/07/20 2219)  . lactated ringers 10 mL/hr at 01/07/20 0300   PRN Meds:.sodium chloride, acetaminophen, diphenhydrAMINE, HYDROmorphone (DILAUDID) injection, levalbuterol, LORazepam, nitroGLYCERIN, ondansetron (ZOFRAN) IV, sodium chloride flush  Diet Orders (From admission, onward)    Start     Ordered   12/18/19 1555  Diet regular Room service appropriate? Yes; Fluid consistency: Thin  Diet effective now       Question Answer Comment  Room service appropriate? Yes   Fluid consistency: Thin      12/18/19 1554          DVT prophylaxis: enoxaparin (LOVENOX) injection 40 mg Start: 12/15/19 2200 Place and maintain sequential compression device Start: 12/14/19 1121  Code Status: Full Code  Family Communication: no family at bedside   Status is: Inpatient  Remains inpatient appropriate because:Inpatient level of care appropriate due to severity of illness   Dispo: The patient is from: Home              Anticipated d/c is to: Home               Anticipated d/c date is: > 3 days              Patient currently is not medically stable to d/c.  Consultants:  ID  Procedures:  2D echo:  IMPRESSIONS  1. Left ventricular ejection fraction, by estimation, is 60 to 65%. The left ventricle has normal function. The left ventricle has no regional wall motion abnormalities. Left ventricular diastolic parameters were normal.  2. Right ventricular systolic function is mildly reduced. The right ventricular size is severely enlarged. There is mildly elevated pulmonary artery systolic pressure.  3. Right atrial size was severely dilated.  4. A small pericardial effusion is present. The pericardial effusion is posterior to the left ventricle.  5. The mitral valve is normal in structure. Trivial mitral valve regurgitation. No evidence of mitral stenosis.  6. Large mobile vegetation on the septal and lateral leaflet with leaflet destruction and failure to coapt leading to wide open TR. The tricuspid valve is abnormal. Tricuspid valve regurgitation is severe.  7. The aortic valve is normal in structure. Aortic valve regurgitation is not visualized. No aortic stenosis is present.  8. The inferior vena cava is dilated in size with <50% respiratory variability, suggesting right atrial pressure of 15 mmHg.   Microbiology  Blood cultures 10/4-Staph aureus  Antimicrobials: IV vancomycin up until 10/25, daptomycin since   Objective: Vitals:   01/07/20 0651 01/07/20 1513 01/07/20 2036 01/08/20 0504  BP:  112/80 109/66 98/67  Pulse:  (!) 108 98 88  Resp:  14 15 16   Temp:  98 F (36.7 C) 98.5 F (36.9 C) 97.8 F (36.6 C)  TempSrc:  Oral Oral Oral  SpO2:  98% 100% 99%  Weight: 59.3 kg     Height:       No intake or output data in the 24 hours ending 01/08/20 0856 Filed Weights   01/05/20 0517 01/06/20 0500 01/07/20 0651  Weight: 60 kg 58.1 kg 59.3 kg    Examination:  Constitutional: NAD, calm, comfortable Eyes: PERRL, lids and  conjunctivae normal ENMT: Mucous membranes are moist.  Neck: normal, supple Respiratory: clear to auscultation bilaterally, no wheezing, no crackles. Normal respiratory effort. Slightly diminished at the bases Cardiovascular: Regular rate and rhythm, no murmurs / rubs / gallops. No LE edema. Abdomen: no tenderness. Bowel sounds positive.  Skin: Petechial rash bilateral lower extremities, improving Neurologic: non focal    Data Reviewed: I have independently reviewed following labs and imaging studies   CBC: Recent Labs  Lab 01/03/20 0541 01/05/20 0522  WBC 14.2* 13.6*  NEUTROABS 10.2*  --   HGB 8.9* 8.8*  HCT 29.2* 29.5*  MCV 89.0 88.6  PLT 248 250   Basic Metabolic Panel: Recent Labs  Lab 01/03/20 0541 01/04/20 0547 01/05/20 0522 01/07/20 0438  NA 135  --  136  --   K 4.3  --  4.5  --   CL 101  --  102  --   CO2 24  --  23  --   GLUCOSE 113*  --  109*  --   BUN 25*  --  24*  --   CREATININE 1.11 1.22 1.23 1.33*  CALCIUM 9.0  --  9.0  --    Liver Function Tests: Recent Labs  Lab 01/03/20 0541 01/05/20 0522  AST 15 18  ALT 14 16  ALKPHOS 202* 224*  BILITOT 0.7 0.8  PROT 8.5* 9.0*  ALBUMIN 2.5* 2.6*   Coagulation Profile: No results for input(s): INR, PROTIME in the last 168 hours. HbA1C: No results for input(s): HGBA1C in the last 72 hours. CBG: No results for input(s): GLUCAP in the last 168 hours.  Recent Results (from the past 240 hour(s))  Culture, blood (Routine X 2) w Reflex to ID Panel     Status: None (Preliminary result)   Collection Time: 01/05/20  3:43 PM   Specimen: BLOOD RIGHT HAND  Result Value Ref Range Status   Specimen Description   Final    BLOOD RIGHT HAND Performed at [facility], [address], [place], [place] [address]    Special Requests   Final    BOTTLES DRAWN AEROBIC ONLY Blood Culture adequate volume Performed at [facility], [address] [place], [place] [address]     Culture   Final    NO GROWTH 2 DAYS Performed at [facility], [address], [place], [place] [address]    Report Status PENDING  Incomplete  Culture, blood (Routine X 2) w Reflex to ID Panel     Status: None (Preliminary result)   Collection Time: 01/05/20  3:43 PM   Specimen: BLOOD RIGHT HAND  Result Value Ref Range Status   Specimen Description   Final    BLOOD RIGHT HAND Performed at [facility], [address] [place], [place] [address]    Special Requests   Final    BOTTLES DRAWN AEROBIC ONLY Blood Culture adequate volume Performed at [facility], [address], [place], [place] [address]    Culture   Final    NO GROWTH 2 DAYS Performed at [facility], [address] [place], [place] [address]    Report Status PENDING  Incomplete     Radiology Studies: No results found.   Pamella Pert, MD, PhD [facility]  Between 7 am - 7 pm I am available, please contact me via Amion or Securechat  Between 7 pm - 7 am I am not available, please contact night coverage MD/APP via Amion

## 2020-01-09 LAB — BASIC METABOLIC PANEL
Anion gap: 10 (ref 5–15)
BUN: 31 mg/dL — ABNORMAL HIGH (ref 6–20)
CO2: 26 mmol/L (ref 22–32)
Calcium: 9.7 mg/dL (ref 8.9–10.3)
Chloride: 100 mmol/L (ref 98–111)
Creatinine, Ser: 1.31 mg/dL — ABNORMAL HIGH (ref 0.61–1.24)
GFR, Estimated: >60 mL/min (ref >60)
Glucose, Bld: 92 mg/dL (ref 70–99)
Potassium: 5 mmol/L (ref 3.5–5.1)
Sodium: 136 mmol/L (ref 135–145)

## 2020-01-09 LAB — CBC
HCT: 33.4 % — ABNORMAL LOW (ref 39.0–52.0)
Hemoglobin: 9.9 g/dL — ABNORMAL LOW (ref 13.0–17.0)
MCH: 25.8 pg — ABNORMAL LOW (ref 26.0–34.0)
MCHC: 29.6 g/dL — ABNORMAL LOW (ref 30.0–36.0)
MCV: 87.2 fL (ref 80.0–100.0)
Platelets: 300 10*3/uL (ref 150–400)
RBC: 3.83 MIL/uL — ABNORMAL LOW (ref 4.22–5.81)
RDW: 17.6 % — ABNORMAL HIGH (ref 11.5–15.5)
WBC: 12.7 10*3/uL — ABNORMAL HIGH (ref 4.0–10.5)
nRBC: 0 % (ref 0.0–0.2)

## 2020-01-09 NOTE — Progress Notes (Signed)
PROGRESS NOTE  Alex [name] KYH:[id number] DOB: 08-Dec-1985 DOA: 12/14/2019 PCP: Alex [name], No Pcp Per   LOS: 26 days   Brief Narrative / Interim history: 34 year old male with history of IVDU, MRSA endocarditis 2020, alcohol abuse, polysubstance abuse, depression, recently admitted to [facility] in [place] where he was found to have MRSA bacteremia and endocarditis, left AMA and readmitted here.  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  Subjective / 24h Interval events: Eating breakfast this morning, feels well.  Assessment & Plan: Principal Problem Disseminated MRSA infection, tricuspid valve endocarditis, multiple septic pulmonary emboli, paraspinal muscular myositis -Alex [name] started on IV vancomycin, however due to a rash on 10/25 this had to be changed to daptomycin.  ID following -They recommend minimum 6 weeks of IV antibiotics until 12/5 but that depends on repeat imaging and clinical course.  He needs a repeat L-spine MRI around the first week of November to monitor course after 4 weeks of antibiotics, initial MRI was on 10/8 and 4 weeks would be on 11/5 -Cardiothoracic surgery consulted and felt endocarditis appears chronic and Alex [name] would not benefit from Angiomax.  He is also not a candidate for tricuspid valve repair due to ongoing drug use. -Had a repeat fever on 10/26, cultures without growth today, continue to monitor to completion  Active Problems Allergic petechial rash to bilateral lower extremities -Possibly due to vancomycin, changed over to daptomycin.  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  Seems to have resolved  Hep C infection -Outpatient ID follow-up  Moderate malnutrition -supplements   Scheduled Meds: . enoxaparin (LOVENOX) injection  40 mg Subcutaneous Q24H  . famotidine  20 mg Oral Daily  . feeding supplement  237 mL Oral TID BM  . ferrous fumarate-b12-vitamic C-folic acid  1 capsule Oral BID PC  . gabapentin  400 mg Oral TID  . hydrOXYzine  25 mg Oral TID  . morphine  15 mg Oral Q12H  . multivitamin with minerals  1 tablet Oral Daily  . naproxen  500 mg Oral TID WC  . senna-docusate  2 tablet Oral BID  . sodium chloride flush  3 mL Intravenous Q12H  . spironolactone  12.5 mg Oral Daily  . traZODone  50 mg Oral QHS   Continuous Infusions: . sodium chloride 250 mL (12/25/19 1321)  . DAPTOmycin (CUBICIN)  IV 500 mg (01/08/20 1931)  . lactated ringers 10 mL/hr at 01/09/20 0520   PRN Meds:.sodium chloride, acetaminophen, diphenhydrAMINE, HYDROmorphone (DILAUDID) injection, levalbuterol, LORazepam, nitroGLYCERIN, ondansetron (ZOFRAN) IV, sodium chloride flush  Diet Orders (From admission, onward)    [place]     Ordered   12/18/19 1555  Diet regular Room service appropriate? Yes; Fluid consistency: Thin  Diet effective now       Question Answer Comment  Room service appropriate? Yes   Fluid consistency: Thin      12/18/19 1554          DVT prophylaxis: enoxaparin (LOVENOX) injection 40 mg Start: 12/15/19 2200 Place and maintain sequential compression device Start: 12/14/19 1121     Code Status: Full Code  Family Communication: no  family at bedside   Status is: Inpatient  Remains inpatient appropriate because:Inpatient level of care appropriate due to severity of illness   Dispo: The Alex [name] is from: Home              Anticipated d/c is to: Home              Anticipated d/c date is: > 3 days               Alex [name] currently is not medically stable to d/c.  Consultants:  ID  Procedures:  2D echo:  IMPRESSIONS  1. Left ventricular ejection fraction, by estimation, is 60 to 65%. The left ventricle has normal function. The left ventricle has no regional wall motion abnormalities. Left ventricular diastolic parameters were normal.  2. Right ventricular systolic function is mildly reduced. The right ventricular size is severely enlarged. There is mildly elevated pulmonary artery systolic pressure.  3. Right atrial size was severely dilated.  4. A small pericardial effusion is present. The pericardial effusion is posterior to the left ventricle.  5. The mitral valve is normal in structure. Trivial mitral valve regurgitation. No evidence of mitral stenosis.  6. Large mobile vegetation on the septal and lateral leaflet with leaflet destruction and failure to coapt leading to wide open TR. The tricuspid valve is abnormal. Tricuspid valve regurgitation is severe.  7. The aortic valve is normal in structure. Aortic valve regurgitation is not visualized. No aortic stenosis is present.  8. The inferior vena cava is dilated in size with <50% respiratory variability, suggesting right atrial pressure of 15 mmHg.   Microbiology  Blood cultures 10/4-Staph aureus  Antimicrobials: IV vancomycin up until 10/25, daptomycin since   Objective: Vitals:   01/08/20 1325 01/08/20 2153 01/09/20 0519 01/09/20 0713  BP: 103/77 101/67 98/70   Pulse: (!) 101 (!) 102 98   Resp: 16 16 16    Temp: 98.4 F (36.9 C) 98 F (36.7 C) 97.8 F (36.6 C)   TempSrc: Oral Oral Oral   SpO2: 90% 96% 97%   Weight:    58 kg  Height:       No intake or output data in the 24 hours ending 01/09/20 1112 Filed Weights   01/06/20 0500 01/07/20 0651 01/09/20 0713  Weight: 58.1 kg 59.3 kg 58 kg    Examination:  Constitutional: No distress Respiratory: Clear to auscultation Cardiovascular: Regular rate and rhythm, 3/6  SEM, no edema   Data Reviewed: I have independently reviewed following labs and imaging studies   CBC: Recent Labs  Lab 01/03/20 0541 01/05/20 0522 01/09/20 0548  WBC 14.2* 13.6* 12.7*  NEUTROABS 10.2*  --   --   HGB 8.9* 8.8* 9.9*  HCT 29.2* 29.5* 33.4*  MCV 89.0 88.6 87.2  PLT 248 250 300   Basic Metabolic Panel: Recent Labs  Lab 01/03/20 0541 01/04/20 0547 01/05/20 0522 01/07/20 0438 01/09/20 0548  NA 135  --  136  --  136  K 4.3  --  4.5  --  5.0  CL 101  --  102  --  100  CO2 24  --  23  --  26  GLUCOSE 113*  --  109*  --  92  BUN 25*  --  24*  --  31*  CREATININE 1.11 1.22 1.23 1.33* 1.31*  CALCIUM 9.0  --  9.0  --  9.7   Liver Function Tests: Recent Labs  Lab 01/03/20 0541 01/05/20 0522  AST 15 18  ALT 14 16  ALKPHOS 202* 224*  BILITOT 0.7 0.8  PROT 8.5* 9.0*  ALBUMIN 2.5* 2.6*   Coagulation Profile: No results for input(s): INR, PROTIME in the last 168 hours. HbA1C: No results for input(s): HGBA1C in the last 72 hours. CBG: No results for input(s): GLUCAP in the last 168 hours.  Recent Results (from the past 240 hour(s))  Culture, blood (Routine X 2) w Reflex to ID Panel     Status: None (Preliminary result)   Collection Time: 01/05/20  3:43 PM   Specimen: BLOOD RIGHT HAND  Result Value Ref Range Status   Specimen Description   Final    BLOOD RIGHT HAND Performed at [facility], [address] [place], [place] [address]    Special Requests   Final    BOTTLES DRAWN AEROBIC ONLY Blood Culture adequate volume Performed at [facility], [address] [place], [place] [address]    Culture   Final    NO GROWTH 3 DAYS Performed at [facility], [address], [place], [place] [address]    Report Status PENDING  Incomplete  Culture, blood (Routine X 2) w Reflex to ID Panel     Status: None (Preliminary result)   Collection Time: 01/05/20  3:43 PM   Specimen: BLOOD RIGHT HAND  Result Value Ref  Range Status   Specimen Description   Final    BLOOD RIGHT HAND Performed at [facility], [address], [place], [place] [address]    Special Requests   Final    BOTTLES DRAWN AEROBIC ONLY Blood Culture adequate volume Performed at [facility], [address], [place], [place] [address]    Culture   Final    NO GROWTH 3 DAYS Performed at [facility], [address], [place], [place] [address]    Report Status PENDING  Incomplete     Radiology Studies: No results found.   Pamella Pert, MD, PhD [facility]  Between 7 am - 7 pm I am available, please contact me via Amion or Securechat  Between 7 pm - 7 am I am not available, please contact night coverage MD/APP via Amion

## 2020-01-10 LAB — COMPREHENSIVE METABOLIC PANEL
ALT: 37 U/L (ref 0–44)
AST: 44 U/L — ABNORMAL HIGH (ref 15–41)
Albumin: 2.5 g/dL — ABNORMAL LOW (ref 3.5–5.0)
Alkaline Phosphatase: 241 U/L — ABNORMAL HIGH (ref 38–126)
Anion gap: 7 (ref 5–15)
BUN: 27 mg/dL — ABNORMAL HIGH (ref 6–20)
CO2: 26 mmol/L (ref 22–32)
Calcium: 9.3 mg/dL (ref 8.9–10.3)
Chloride: 102 mmol/L (ref 98–111)
Creatinine, Ser: 1.2 mg/dL (ref 0.61–1.24)
GFR, Estimated: >60 mL/min (ref >60)
Glucose, Bld: 101 mg/dL — ABNORMAL HIGH (ref 70–99)
Potassium: 4.4 mmol/L (ref 3.5–5.1)
Sodium: 135 mmol/L (ref 135–145)
Total Bilirubin: 0.8 mg/dL (ref 0.3–1.2)
Total Protein: 8.6 g/dL — ABNORMAL HIGH (ref 6.5–8.1)

## 2020-01-10 LAB — CBC WITH DIFFERENTIAL/PLATELET
Abs Immature Granulocytes: 0.05 10*3/uL (ref 0.00–0.07)
Basophils Absolute: 0.1 10*3/uL (ref 0.0–0.1)
Basophils Relative: 1 %
Eosinophils Absolute: 0.4 10*3/uL (ref 0.0–0.5)
Eosinophils Relative: 3 %
HCT: 29.5 % — ABNORMAL LOW (ref 39.0–52.0)
Hemoglobin: 9.1 g/dL — ABNORMAL LOW (ref 13.0–17.0)
Immature Granulocytes: 1 %
Lymphocytes Relative: 28 %
Lymphs Abs: 3 10*3/uL (ref 0.7–4.0)
MCH: 26.1 pg (ref 26.0–34.0)
MCHC: 30.8 g/dL (ref 30.0–36.0)
MCV: 84.5 fL (ref 80.0–100.0)
Monocytes Absolute: 0.8 10*3/uL (ref 0.1–1.0)
Monocytes Relative: 7 %
Neutro Abs: 6.5 10*3/uL (ref 1.7–7.7)
Neutrophils Relative %: 60 %
Platelets: 286 10*3/uL (ref 150–400)
RBC: 3.49 MIL/uL — ABNORMAL LOW (ref 4.22–5.81)
RDW: 17.5 % — ABNORMAL HIGH (ref 11.5–15.5)
WBC: 10.8 10*3/uL — ABNORMAL HIGH (ref 4.0–10.5)
nRBC: 0 % (ref 0.0–0.2)

## 2020-01-10 LAB — CULTURE, BLOOD (ROUTINE X 2)
Culture: NO GROWTH
Culture: NO GROWTH
Special Requests: ADEQUATE
Special Requests: ADEQUATE

## 2020-01-10 LAB — C-REACTIVE PROTEIN: CRP: 8.8 mg/dL — ABNORMAL HIGH (ref <1.0)

## 2020-01-10 MED ORDER — GABAPENTIN 100 MG PO CAPS
100.0000 mg | ORAL_CAPSULE | Freq: Three times a day (TID) | ORAL | Status: AC
  Administered 2020-01-10 – 2020-01-11 (×3): 100 mg via ORAL
  Filled 2020-01-10 (×3): qty 1

## 2020-01-10 NOTE — Progress Notes (Signed)
PROGRESS NOTE  Alex [name] FTD:[id number] DOB: 06-Nov-1985 DOA: 12/14/2019 PCP: Patient, No Pcp Per   LOS: 27 days   Brief Narrative / Interim history: 34 year old male with history of IVDU, MRSA endocarditis 2020, alcohol abuse, polysubstance abuse, depression, recently admitted to [facility] in [place] where he was found to have MRSA bacteremia and endocarditis, left AMA and readmitted here.  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  Subjective / 24h Interval events: Eating breakfast this morning, feels well.  Assessment & Plan: Principal Problem Disseminated MRSA infection, tricuspid valve endocarditis, multiple septic pulmonary emboli, paraspinal muscular myositis -Patient started on IV vancomycin, however due to a rash on 10/25 this had to be changed to daptomycin.  ID following -They recommend minimum 6 weeks of IV antibiotics until 12/5 but that depends on repeat imaging and clinical course.  He needs a repeat L-spine MRI around the first week of November to monitor course after 4 weeks of antibiotics, initial MRI was on 10/8 and 4 weeks would be on 11/5 -Cardiothoracic surgery consulted and felt endocarditis appears chronic and patient would not benefit from Angiomax.  He is also not a candidate for tricuspid valve repair due to ongoing drug use. -Had a repeat fever on 10/26, culture remains without growth  Active Problems Allergic petechial rash to bilateral lower extremities -Possibly due to vancomycin, changed over to daptomycin.  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  Possible side effect to gabapentin, this is  a new medication for him and has not been on this in the past.  Will decrease the dose for 1 more day then stop  Polysubstance abuse -Patient will need to quit  Chronic pain syndrome -Stable on the regimen as below  Scrotal swelling -Doppler negative for acute abnormalities, possibly third spacing from hypoalbuminemia, urology discussed with the prior hospitalist and recommended conservative measures.  Seems to have resolved  Hep C infection -Outpatient ID follow-up  Moderate malnutrition -supplements   Scheduled Meds: . enoxaparin (LOVENOX) injection  40 mg Subcutaneous Q24H  . famotidine  20 mg Oral Daily  . feeding supplement  237 mL Oral TID BM  . ferrous fumarate-b12-vitamic C-folic acid  1 capsule Oral BID PC  . gabapentin  100 mg Oral TID  . hydrOXYzine  25 mg Oral TID  . morphine  15 mg Oral Q12H  . multivitamin with minerals  1 tablet Oral Daily  . naproxen  500 mg Oral TID WC  . senna-docusate  2 tablet Oral BID  . sodium chloride flush  3 mL Intravenous Q12H  . spironolactone  12.5 mg Oral Daily  . traZODone  50 mg Oral QHS   Continuous Infusions: . sodium chloride 250 mL (12/25/19 1321)  . DAPTOmycin (CUBICIN)  IV 500 mg (01/09/20 2010)  . lactated ringers 10 mL/hr at 01/09/20 0520   PRN Meds:.sodium chloride, acetaminophen, diphenhydrAMINE, HYDROmorphone (DILAUDID) injection, levalbuterol, LORazepam, nitroGLYCERIN, ondansetron (ZOFRAN) IV, sodium chloride flush  Diet Orders (From admission, onward)    [place]     Ordered   12/18/19 1555  Diet regular Room service appropriate? Yes; Fluid consistency: Thin  Diet effective now       Question Answer Comment  Room service appropriate? Yes   Fluid  consistency: Thin      12/18/19 1554          DVT prophylaxis: enoxaparin (LOVENOX) injection 40 mg Start: 12/15/19 2200 Place and maintain sequential compression device Start: 12/14/19 1121     Code Status: Full Code  Family Communication: no family at  bedside   Status is: Inpatient  Remains inpatient appropriate because:Inpatient level of care appropriate due to severity of illness   Dispo: The patient is from: Home              Anticipated d/c is to: Home              Anticipated d/c date is: > 3 days              Patient currently is not medically stable to d/c.  Consultants:  ID  Procedures:  2D echo:  IMPRESSIONS  1. Left ventricular ejection fraction, by estimation, is 60 to 65%. The left ventricle has normal function. The left ventricle has no regional wall motion abnormalities. Left ventricular diastolic parameters were normal.  2. Right ventricular systolic function is mildly reduced. The right ventricular size is severely enlarged. There is mildly elevated pulmonary artery systolic pressure.  3. Right atrial size was severely dilated.  4. A small pericardial effusion is present. The pericardial effusion is posterior to the left ventricle.  5. The mitral valve is normal in structure. Trivial mitral valve regurgitation. No evidence of mitral stenosis.  6. Large mobile vegetation on the septal and lateral leaflet with leaflet destruction and failure to coapt leading to wide open TR. The tricuspid valve is abnormal. Tricuspid valve regurgitation is severe.  7. The aortic valve is normal in structure. Aortic valve regurgitation is not visualized. No aortic stenosis is present.  8. The inferior vena cava is dilated in size with <50% respiratory variability, suggesting right atrial pressure of 15 mmHg.   Microbiology  Blood cultures 10/4-Staph aureus  Antimicrobials: IV vancomycin up until 10/25, daptomycin since   Objective: Vitals:   01/09/20 0713 01/09/20 1338 01/09/20 2023 01/10/20 0606  BP:  106/66 108/73 (!) 97/51  Pulse:  (!) 104 98 87  Resp:  18 16 16   Temp:  98.3 F (36.8 C) 98.1 F (36.7 C) 97.8 F (36.6 C)  TempSrc:  Oral Oral Oral  SpO2:  98% 96% 97%  Weight: 58 kg     Height:       No intake  or output data in the 24 hours ending 01/10/20 1016 Filed Weights   01/06/20 0500 01/07/20 0651 01/09/20 0713  Weight: 58.1 kg 59.3 kg 58 kg    Examination:  Constitutional: NAD, calm, comfortable Eyes: PERRL, lids and conjunctivae normal ENMT: Mucous membranes are moist.  Neck: normal, supple Respiratory: clear to auscultation bilaterally, no wheezing, no crackles. Normal respiratory effort.  Cardiovascular: Regular rate and rhythm, 3/6 SEM. No LE edema. 2+ pedal pulses.  Abdomen: no tenderness. Bowel sounds positive.  Skin: No new rashes, petechial rash improving Neurologic: non focal   Data Reviewed: I have independently reviewed following labs and imaging studies   CBC: Recent Labs  Lab 01/05/20 0522 01/09/20 0548 01/10/20 0646  WBC 13.6* 12.7* 10.8*  NEUTROABS  --   --  6.5  HGB 8.8* 9.9* 9.1*  HCT 29.5* 33.4* 29.5*  MCV 88.6 87.2 84.5  PLT 250 300 286   Basic Metabolic Panel: Recent Labs  Lab 01/04/20 0547 01/05/20 0522 01/07/20 0438 01/09/20 0548 01/10/20 0646  NA  --  136  --  136 135  K  --  4.5  --  5.0 4.4  CL  --  102  --  100 102  CO2  --  23  --  26 26  GLUCOSE  --  109*  --  92 101*  BUN  --  24*  --  31* 27*  CREATININE 1.22 1.23 1.33* 1.31* 1.20  CALCIUM  --  9.0  --  9.7 9.3   Liver Function Tests: Recent Labs  Lab 01/05/20 0522 01/10/20 0646  AST 18 44*  ALT 16 37  ALKPHOS 224* 241*  BILITOT 0.8 0.8  PROT 9.0* 8.6*  ALBUMIN 2.6* 2.5*   Coagulation Profile: No results for input(s): INR, PROTIME in the last 168 hours. HbA1C: No results for input(s): HGBA1C in the last 72 hours. CBG: No results for input(s): GLUCAP in the last 168 hours.  Recent Results (from the past 240 hour(s))  Culture, blood (Routine X 2) w Reflex to ID Panel     Status: None   Collection Time: 01/05/20  3:43 PM   Specimen: BLOOD RIGHT HAND  Result Value Ref Range Status   Specimen Description   Final    BLOOD RIGHT HAND Performed at [facility], [address] [place], [place] [address]    Special Requests   Final    BOTTLES DRAWN AEROBIC ONLY Blood Culture adequate volume Performed at [facility], [address] [place], [place] [address]    Culture   Final    NO GROWTH 5 DAYS Performed at [facility], [address], [place], [place] [address]    Report Status 01/10/2020 FINAL  Final  Culture, blood (Routine X 2) w Reflex to ID Panel     Status: None   Collection Time: 01/05/20  3:43 PM   Specimen: BLOOD RIGHT HAND  Result Value Ref Range Status   Specimen Description   Final    BLOOD RIGHT HAND Performed at [facility], [address], [place], [place] [address]    Special Requests   Final    BOTTLES DRAWN AEROBIC ONLY Blood Culture adequate volume Performed at [facility], [address], [place], [place] [address]    Culture   Final    NO GROWTH 5 DAYS Performed at [facility], [address] [place], [place] [address]    Report Status 01/10/2020 FINAL  Final     Radiology Studies: No results found.   Pamella Pert, MD, PhD [facility]  Between 7 am - 7 pm I am available, please contact me via Amion or Securechat  Between 7 pm - 7 am I am not available, please contact night coverage MD/APP via Amion

## 2020-01-11 DIAGNOSIS — M310 Hypersensitivity angiitis: Secondary | ICD-10-CM

## 2020-01-11 DIAGNOSIS — J189 Pneumonia, unspecified organism: Secondary | ICD-10-CM

## 2020-01-11 DIAGNOSIS — I776 Arteritis, unspecified: Secondary | ICD-10-CM

## 2020-01-11 DIAGNOSIS — I079 Rheumatic tricuspid valve disease, unspecified: Secondary | ICD-10-CM

## 2020-01-11 DIAGNOSIS — R7881 Bacteremia: Secondary | ICD-10-CM

## 2020-01-11 DIAGNOSIS — B9562 Methicillin resistant Staphylococcus aureus infection as the cause of diseases classified elsewhere: Secondary | ICD-10-CM

## 2020-01-11 LAB — CK: Total CK: 25 U/L — ABNORMAL LOW (ref 49–397)

## 2020-01-11 MED ORDER — DIPHENHYDRAMINE-ZINC ACETATE 2-0.1 % EX CREA
TOPICAL_CREAM | Freq: Two times a day (BID) | CUTANEOUS | Status: DC | PRN
  Filled 2020-01-11: qty 28

## 2020-01-11 NOTE — Progress Notes (Signed)
[facility]    Date of Admission:  12/14/2019   Total days of antibiotics 23  ID: Alex [name] is a 34 y.o. male with  MRSA bacteremia, TV endocarditis, pulmonary cavitary pneumonia, and myositis Principal Problem:   Endocarditis Active Problems:   Polysubstance dependence (HCC)   IVDU (intravenous drug user)   Acute CHF (congestive heart failure) (HCC)   Anemia   Acute septic pulmonary embolism (HCC)   Goals of care, counseling/discussion   Palliative care encounter   Chronic hepatitis C without hepatic coma (HCC)   Infective myositis   Pleural effusion, bilateral   Malnutrition of moderate degree    Subjective: Rash to bilateral lower legs reappeared. Has intermittent small teaspoon blood noted from productive cough again, but same amount as yesterday.   Medications:  . enoxaparin (LOVENOX) injection  40 mg Subcutaneous Q24H  . famotidine  20 mg Oral Daily  . feeding supplement  237 mL Oral TID BM  . ferrous fumarate-b12-vitamic C-folic acid  1 capsule Oral BID PC  . hydrOXYzine  25 mg Oral TID  . morphine  15 mg Oral Q12H  . multivitamin with minerals  1 tablet Oral Daily  . naproxen  500 mg Oral TID WC  . senna-docusate  2 tablet Oral BID  . sodium chloride flush  3 mL Intravenous Q12H  . spironolactone  12.5 mg Oral Daily  . traZODone  50 mg Oral QHS    Objective: Vital signs in last 24 hours: Temp:  [97.4 F (36.3 C)-98.4 F (36.9 C)] 98.4 F (36.9 C) (11/01 1500) Pulse Rate:  [81-96] 91 (11/01 1500) Resp:  [14-18] 18 (11/01 1500) BP: (89-101)/(63-77) 98/77 (11/01 1500) SpO2:  [97 %-99 %] 99 % (11/01 1500) Physical Exam  Constitutional: He is oriented to person, place, and time. He appears well-developed and well-nourished. No distress.  HENT:  Mouth/Throat: Oropharynx is clear and moist. No oropharyngeal exudate.  Cardiovascular: Normal rate, regular rhythm and normal heart sounds. Exam reveals no gallop and no friction rub.   No murmur heard.  Pulmonary/Chest: Effort normal and breath sounds normal. No respiratory distress. He has no wheezes.  Abdominal: Soft. Bowel sounds are normal. He exhibits no distension. There is no tenderness.  Lymphadenopathy:  He has no cervical adenopathy.  Neurological: He is alert and oriented to person, place, and time.  Skin: Skin is warm and dry. Bilateral lower leg petechial rash. Psychiatric: He has a normal mood and affect. His behavior is normal.     Lab Results Recent Labs    01/09/20 0548 01/10/20 0646  WBC 12.7* 10.8*  HGB 9.9* 9.1*  HCT 33.4* 29.5*  NA 136 135  K 5.0 4.4  CL 100 102  CO2 26 26  BUN 31* 27*  CREATININE 1.31* 1.20   Liver Panel Recent Labs    01/10/20 0646  PROT 8.6*  ALBUMIN 2.5*  AST 44*  ALT 37  ALKPHOS 241*  BILITOT 0.8   Sedimentation Rate C-Reactive Protein Recent Labs    01/10/20 0646  CRP 8.8*    Microbiology: 10/26 blood cx ngtd Studies/Results: No results found.   Assessment/Plan: MRSA complicated bacteremia with TV endocarditis, pulmonary septic emboli/cavitary lesions and myositis with recurrent rash, c/w leukocytoclastic vasculitis- initially thought it was due to cephalosporin, but now it is reoccurring and wonder if it just due to endocarditis vs. Hx of HCV.  - would recommend to do 0.5mg /kg -- roughly 30mg  prednisone daily for 1-2 wk then  taper off to see if improvement and relief of itching  - continue on daptomycin for now. Total course of therapy was planned to be 6 wk  Myositis = has repeat mri to see if any drainable abscess  Hemoptysis = appears small amount likely from cavitary lung infection, will continue to monitor if worsens, appears intermittent, has 1 teaspoon blood reported today.    [facility] Cell: [phone] Pager: [phone]  01/11/2020, 4:57 PM

## 2020-01-11 NOTE — Progress Notes (Signed)
PROGRESS NOTE  Alex [name] NWG:[id number] DOB: 01-12-1986 DOA: 12/14/2019 PCP: Patient, No Pcp Per   LOS: 28 days   Brief Narrative / Interim history: 34 year old male with history of IVDU, MRSA endocarditis 2020, alcohol abuse, polysubstance abuse, depression, recently admitted to [facility] in [place] where he was found to have MRSA bacteremia and endocarditis, left AMA and readmitted here.  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  Subjective / 24h Interval events: His rash is back.  Complains of itchiness bilateral legs.  Assessment & Plan: Principal Problem Disseminated MRSA infection, tricuspid valve endocarditis, multiple septic pulmonary emboli, paraspinal muscular myositis -Patient started on IV vancomycin, however due to a rash on 10/25 this had to be changed to daptomycin.  ID following -They recommend minimum 6 weeks of IV antibiotics until 12/5 but that depends on repeat imaging and clinical course.  He needs a repeat L-spine MRI around the first week of November to monitor course after 4 weeks of antibiotics, initial MRI was on 10/8.  Order MRI today -Cardiothoracic surgery consulted and felt endocarditis appears chronic and patient would not benefit from Angiomax.  He is also not a candidate for tricuspid valve repair due to ongoing drug use. -Had a repeat fever on 10/26, repeat blood cultures have remained without growth.  Active Problems Allergic petechial rash to bilateral lower extremities -Possibly due to vancomycin, changed over to daptomycin.  -Rash appears to be recurrent today, pruritic in nature.?  Vasculitis.  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  Possible side effect to gabapentin, this is a new medication for him and has not been on this in the past.  Gabapentin dose decreased, tremors are improved now.  Will discontinue gabapentin today  Polysubstance abuse -Patient will need to quit  Chronic pain syndrome -Stable on the regimen as below  Scrotal swelling -Doppler negative for acute abnormalities, possibly third spacing from hypoalbuminemia, urology discussed with the prior hospitalist and recommended conservative measures.  Seems to have resolved  Hep C infection -Outpatient ID follow-up  Moderate malnutrition -supplements   Scheduled Meds: . enoxaparin (LOVENOX) injection  40 mg Subcutaneous Q24H  . famotidine  20 mg Oral Daily  . feeding supplement  237 mL Oral TID BM  . ferrous fumarate-b12-vitamic C-folic acid  1 capsule Oral BID PC  . hydrOXYzine  25 mg Oral TID  . morphine  15 mg Oral Q12H  . multivitamin with minerals  1 tablet Oral Daily  . naproxen  500 mg Oral TID WC  . senna-docusate  2 tablet Oral BID  . sodium chloride flush  3 mL Intravenous Q12H  . spironolactone  12.5 mg Oral Daily  . traZODone  50 mg Oral QHS   Continuous Infusions: . sodium chloride 250 mL (12/25/19 1321)  . DAPTOmycin (CUBICIN)  IV 500 mg (01/10/20 2046)  . lactated ringers 10 mL/hr at 01/09/20 0520   PRN Meds:.sodium chloride, acetaminophen, diphenhydrAMINE, HYDROmorphone (DILAUDID) injection, levalbuterol, LORazepam, nitroGLYCERIN, ondansetron (ZOFRAN) IV, sodium chloride flush  Diet Orders (From admission, onward)    Start     Ordered   12/18/19 1555  Diet regular Room service appropriate? Yes; Fluid consistency: Thin  Diet effective now       Question Answer Comment  Room service appropriate? Yes   Fluid consistency: Thin      12/18/19 1554          DVT prophylaxis: enoxaparin (LOVENOX) injection 40 mg Start: 12/15/19 2200 Place and maintain sequential compression  device Start: 12/14/19 1121     Code Status: Full Code  Family Communication: no family at bedside   Status is: Inpatient  Remains inpatient appropriate because:Inpatient level of care appropriate due to severity of illness   Dispo: The patient is from: Home              Anticipated d/c is to: Home              Anticipated d/c date is: > 3 days              Patient currently is not medically stable to d/c.  Consultants:  ID  Procedures:  2D echo:  IMPRESSIONS  1. Left ventricular ejection fraction, by estimation, is 60 to 65%. The left ventricle has normal function. The left ventricle has no regional wall motion abnormalities. Left ventricular diastolic parameters were normal.  2. Right ventricular systolic function is mildly reduced. The right ventricular size is severely enlarged. There is mildly elevated pulmonary artery systolic pressure.  3. Right atrial size was severely dilated.  4. A small pericardial effusion is present. The pericardial effusion is posterior to the left ventricle.  5. The mitral valve is normal in structure. Trivial mitral valve regurgitation. No evidence of mitral stenosis.  6. Large mobile vegetation on the septal and lateral leaflet with leaflet destruction and failure to coapt leading to wide open TR. The tricuspid valve is abnormal. Tricuspid valve regurgitation is severe.  7. The aortic valve is normal in structure. Aortic valve regurgitation is not visualized. No aortic stenosis is present.  8. The inferior vena cava is dilated in size with <50% respiratory variability, suggesting right atrial pressure of 15 mmHg.   Microbiology  Blood cultures 10/4-Staph aureus  Antimicrobials: IV vancomycin up until 10/25, daptomycin since   Objective: Vitals:   01/10/20 1546 01/10/20 2017 01/11/20 0036 01/11/20 0613  BP: (!) 92/59 101/70 92/67 (!) 89/63  Pulse: 90 96 86 81  Resp: 16 14  14   Temp: 97.8 F (36.6 C) (!) 97.5 F (36.4 C) 97.7 F  (36.5 C) (!) 97.4 F (36.3 C)  TempSrc: Oral Oral Axillary Oral  SpO2: 97% 97% 97% 98%  Weight:      Height:        Intake/Output Summary (Last 24 hours) at 01/11/2020 1157 Last data filed at 01/10/2020 1707 Gross per 24 hour  Intake 720 ml  Output --  Net 720 ml   Filed Weights   01/06/20 0500 01/07/20 0651 01/09/20 0713  Weight: 58.1 kg 59.3 kg 58 kg    Examination:  Constitutional: No distress, comfortable Eyes: No scleral icterus ENMT: Moist mucous membranes Neck: normal, supple Respiratory: Clear bilaterally without wheezing Cardiovascular: Regular rate and rhythm, 3/6 SEM, no edema Abdomen: Bowel sounds positive Skin: New worsening petechial rash, nonblanching, bilateral lower extremities up to the knees Neurologic: No focal deficits   Data Reviewed: I have independently reviewed following labs and imaging studies   CBC: Recent Labs  Lab 01/05/20 0522 01/09/20 0548 01/10/20 0646  WBC 13.6* 12.7* 10.8*  NEUTROABS  --   --  6.5  HGB 8.8* 9.9* 9.1*  HCT 29.5* 33.4* 29.5*  MCV 88.6 87.2 84.5  PLT 250 300 286   Basic Metabolic Panel: Recent  Labs  Lab 01/05/20 0522 01/07/20 0438 01/09/20 0548 01/10/20 0646  NA 136  --  136 135  K 4.5  --  5.0 4.4  CL 102  --  100 102  CO2 23  --  26 26  GLUCOSE 109*  --  92 101*  BUN 24*  --  31* 27*  CREATININE 1.23 1.33* 1.31* 1.20  CALCIUM 9.0  --  9.7 9.3   Liver Function Tests: Recent Labs  Lab 01/05/20 0522 01/10/20 0646  AST 18 44*  ALT 16 37  ALKPHOS 224* 241*  BILITOT 0.8 0.8  PROT 9.0* 8.6*  ALBUMIN 2.6* 2.5*   Coagulation Profile: No results for input(s): INR, PROTIME in the last 168 hours. HbA1C: No results for input(s): HGBA1C in the last 72 hours. CBG: No results for input(s): GLUCAP in the last 168 hours.  Recent Results (from the past 240 hour(s))  Culture, blood (Routine X 2) w Reflex to ID Panel     Status: None   Collection Time: 01/05/20  3:43 PM   Specimen: BLOOD RIGHT HAND   Result Value Ref Range Status   Specimen Description   Final    BLOOD RIGHT HAND Performed at [facility], [address] [place], [place] [address]    Special Requests   Final    BOTTLES DRAWN AEROBIC ONLY Blood Culture adequate volume Performed at [facility], [address], [place], [place] [address]    Culture   Final    NO GROWTH 5 DAYS Performed at [facility], [address] [place], [place] [address]    Report Status 01/10/2020 FINAL  Final  Culture, blood (Routine X 2) w Reflex to ID Panel     Status: None   Collection Time: 01/05/20  3:43 PM   Specimen: BLOOD RIGHT HAND  Result Value Ref Range Status   Specimen Description   Final    BLOOD RIGHT HAND Performed at [facility], [address], [place], [place] [address]    Special Requests   Final    BOTTLES DRAWN AEROBIC ONLY Blood Culture adequate volume Performed at [facility], [address], [place], [place] [address]    Culture   Final    NO GROWTH 5 DAYS Performed at [facility], [address], [place], [place] [address]    Report Status 01/10/2020 FINAL  Final     Radiology Studies: No results found.   Pamella Pert, MD, PhD [facility]  Between 7 am - 7 pm I am available, please contact me via Amion or Securechat  Between 7 pm - 7 am I am not available, please contact night coverage MD/APP via Amion

## 2020-01-11 NOTE — Plan of Care (Signed)
  Problem: Clinical Measurements: Goal: Cardiovascular complication will be avoided Outcome: Progressing   Problem: Nutrition: Goal: Adequate nutrition will be maintained Outcome: Progressing   Problem: Coping: Goal: Level of anxiety will decrease Outcome: Progressing   Problem: Pain Managment: Goal: General experience of comfort will improve Outcome: Progressing Note: Patient received Dilaudid once so far this shift.    Problem: Safety: Goal: Ability to remain free from injury will improve Outcome: Progressing Note: Patient is sitting up in bed. Patient is ambulatory. All clutter in the room was cleaned.    Problem: Skin Integrity: Goal: Risk for impaired skin integrity will decrease Outcome: Progressing Note: Patient given Bendadryl ointment for bilateral lower extremities. Patient took shower this evening.

## 2020-01-12 MED ORDER — PREDNISONE 5 MG PO TABS
30.0000 mg | ORAL_TABLET | Freq: Every day | ORAL | Status: DC
  Administered 2020-01-12 – 2020-01-18 (×6): 30 mg via ORAL
  Filled 2020-01-12 (×6): qty 1

## 2020-01-12 MED ORDER — HYDROCERIN EX CREA
TOPICAL_CREAM | Freq: Two times a day (BID) | CUTANEOUS | Status: DC
  Filled 2020-01-12: qty 113

## 2020-01-12 NOTE — Progress Notes (Signed)
[facility]    Date of Admission:  12/14/2019   Total days of antibiotics 25   ID: Alex [name] is a 34 y.o. male with MRSA bacteremia/TV endocarditis, septic pulmonary emboli with cavitary lesions and spinal myositis. C/b leukocytoclastic vasculitis Principal Problem:   Endocarditis Active Problems:   Polysubstance dependence (HCC)   IVDU (intravenous drug user)   Acute CHF (congestive heart failure) (HCC)   Anemia   Acute septic pulmonary embolism (HCC)   Goals of care, counseling/discussion   Palliative care encounter   Chronic hepatitis C without hepatic coma (HCC)   Infective myositis   Pleural effusion, bilateral   Malnutrition of moderate degree   Multifocal pneumonia   MRSA bacteremia   Leukocytoclastic vasculitis (HCC)    Subjective: Afebrile, rash to lower extremity to his knees bilaterally.dry skin to his legs as well. No fever, diarrhea,shortness of breath. His aunt is visiting him.  Medications:  . enoxaparin (LOVENOX) injection  40 mg Subcutaneous Q24H  . famotidine  20 mg Oral Daily  . feeding supplement  237 mL Oral TID BM  . ferrous fumarate-b12-vitamic C-folic acid  1 capsule Oral BID PC  . hydrocerin   Topical BID  . hydrOXYzine  25 mg Oral TID  . morphine  15 mg Oral Q12H  . multivitamin with minerals  1 tablet Oral Daily  . naproxen  500 mg Oral TID WC  . predniSONE  30 mg Oral Q breakfast  . senna-docusate  2 tablet Oral BID  . sodium chloride flush  3 mL Intravenous Q12H  . spironolactone  12.5 mg Oral Daily  . traZODone  50 mg Oral QHS    Objective: Vital signs in last 24 hours: Temp:  [98.2 F (36.8 C)-99 F (37.2 C)] 98.2 F (36.8 C) (11/02 1424) Pulse Rate:  [88-105] 105 (11/02 1424) Resp:  [14-18] 18 (11/02 1424) BP: (96-117)/(64-76) 117/76 (11/02 1424) SpO2:  [97 %-99 %] 99 % (11/02 1424) Physical Exam  Constitutional: He is oriented to person, place, and time. He appears well-developed and  well-nourished. No distress.  HENT:  Mouth/Throat: Oropharynx is clear and moist. No oropharyngeal exudate.  Cardiovascular: Normal rate, regular rhythm and normal heart sounds. Exam reveals no gallop and no friction rub.  No murmur heard.  Pulmonary/Chest: Effort normal and breath sounds normal. No respiratory distress. He has no wheezes.  Abdominal: Soft. Bowel sounds are normal. He exhibits no distension. There is no tenderness.  Lymphadenopathy:  He has no cervical adenopathy.  Neurological: He is alert and oriented to person, place, and time.  Skin: Skin is warm and dry. Petechial rash to lower extremities. Psychiatric: He has a normal mood and affect. His behavior is normal.     Lab Results Recent Labs    01/10/20 0646  WBC 10.8*  HGB 9.1*  HCT 29.5*  NA 135  K 4.4  CL 102  CO2 26  BUN 27*  CREATININE 1.20   Liver Panel Recent Labs    01/10/20 0646  PROT 8.6*  ALBUMIN 2.5*  AST 44*  ALT 37  ALKPHOS 241*  BILITOT 0.8   C-Reactive Protein Recent Labs    01/10/20 0646  CRP 8.8*    Microbiology: reviewed Studies/Results: No results found.   Assessment/Plan: Leukocytoclastic vasculitis = will start prednisone 30mg  daily  MRSA bacteremia/endocarditis = continue on daptomycin  Dermatitis = will start on eucerin bid  [facility] Cell: [phone] Pager: [phone]  01/12/2020, 3:51 PM

## 2020-01-12 NOTE — Progress Notes (Signed)
PROGRESS NOTE  Alex [name] JGO:[id number] DOB: 22-Nov-1985 DOA: 12/14/2019 PCP: Patient, No Pcp Per   LOS: 29 days   Brief Narrative / Interim history: 34 year old male with history of IVDU, MRSA endocarditis 2020, alcohol abuse, polysubstance abuse, depression, recently admitted to [facility] in [place] where he was found to have MRSA bacteremia and endocarditis, left AMA and readmitted here.  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  Subjective / 24h Interval events: No complaints, rash better today   Assessment & Plan: Principal Problem Disseminated MRSA infection, tricuspid valve endocarditis, multiple septic pulmonary emboli, paraspinal muscular myositis -Patient started on IV vancomycin, however due to a rash on 10/25 this had to be changed to daptomycin.  ID following -They recommend minimum 6 weeks of IV antibiotics until 12/5 but that depends on repeat imaging and clinical course.  He needs a repeat L-spine MRI around the first week of November to monitor course after 4 weeks of antibiotics, initial MRI was on 10/8. Repeat MRI pending  -Cardiothoracic surgery consulted and felt endocarditis appears chronic and patient would not benefit from Angiomax.  He is also not a candidate for tricuspid valve repair due to ongoing drug use. -Had a repeat fever on 10/26, repeat blood cultures have remained without growth.  Active Problems Petechial rash to bilateral lower extremities, possibly leukocytoclastic  -initially possibly due to vancomycin, changed over to daptomycin.  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  Possible side effect to gabapentin, this is a new medication for him and has not been on this in the past.  Gabapentin dose decreased, tremors are improved now.  Gabapentin has been discontinued  Polysubstance abuse -Patient will need to quit  Chronic pain syndrome -Stable on the regimen as below  Scrotal swelling -Doppler negative for acute abnormalities, possibly third spacing from hypoalbuminemia, urology discussed with the prior hospitalist and recommended conservative measures.  Seems to have resolved  Hep C infection -Outpatient ID follow-up  Moderate malnutrition -supplements   Scheduled Meds: . enoxaparin (LOVENOX) injection  40 mg Subcutaneous Q24H  . famotidine  20 mg Oral Daily  . feeding supplement  237 mL Oral TID BM  . ferrous fumarate-b12-vitamic C-folic acid  1 capsule Oral BID PC  . hydrOXYzine  25 mg Oral TID  . morphine  15 mg Oral Q12H  . multivitamin with minerals  1 tablet Oral Daily  . naproxen  500 mg Oral TID WC  . senna-docusate  2 tablet Oral BID  . sodium chloride flush  3 mL Intravenous Q12H  . spironolactone  12.5 mg Oral Daily  . traZODone  50 mg Oral QHS   Continuous Infusions: . sodium chloride 250 mL (12/25/19 1321)  . DAPTOmycin (CUBICIN)  IV 500 mg (01/11/20 2141)  . lactated ringers 10 mL/hr at 01/09/20 0520   PRN Meds:.sodium chloride, acetaminophen, diphenhydrAMINE, diphenhydrAMINE-zinc acetate, HYDROmorphone (DILAUDID) injection, levalbuterol, LORazepam, nitroGLYCERIN, ondansetron (ZOFRAN) IV, sodium chloride flush  Diet Orders (From admission, onward)    Start     Ordered   12/18/19 1555  Diet regular Room service appropriate? Yes; Fluid consistency: Thin  Diet effective now  Question Answer Comment  Room service appropriate? Yes   Fluid consistency: Thin      12/18/19 1554          DVT prophylaxis: enoxaparin (LOVENOX) injection 40 mg Start: 12/15/19  2200 Place and maintain sequential compression device Start: 12/14/19 1121     Code Status: Full Code  Family Communication: no family at bedside   Status is: Inpatient  Remains inpatient appropriate because:Inpatient level of care appropriate due to severity of illness   Dispo: The patient is from: Home              Anticipated d/c is to: Home              Anticipated d/c date is: > 3 days              Patient currently is not medically stable to d/c.  Consultants:  ID  Procedures:  2D echo:  IMPRESSIONS  1. Left ventricular ejection fraction, by estimation, is 60 to 65%. The left ventricle has normal function. The left ventricle has no regional wall motion abnormalities. Left ventricular diastolic parameters were normal.  2. Right ventricular systolic function is mildly reduced. The right ventricular size is severely enlarged. There is mildly elevated pulmonary artery systolic pressure.  3. Right atrial size was severely dilated.  4. A small pericardial effusion is present. The pericardial effusion is posterior to the left ventricle.  5. The mitral valve is normal in structure. Trivial mitral valve regurgitation. No evidence of mitral stenosis.  6. Large mobile vegetation on the septal and lateral leaflet with leaflet destruction and failure to coapt leading to wide open TR. The tricuspid valve is abnormal. Tricuspid valve regurgitation is severe.  7. The aortic valve is normal in structure. Aortic valve regurgitation is not visualized. No aortic stenosis is present.  8. The inferior vena cava is dilated in size with <50% respiratory variability, suggesting right atrial pressure of 15 mmHg.   Microbiology  Blood cultures 10/4-Staph aureus  Antimicrobials: IV vancomycin up until 10/25, daptomycin since   Objective: Vitals:   01/11/20 0613 01/11/20 1500 01/11/20 2139 01/12/20 0548  BP: (!) 89/63 98/77 103/64 96/67  Pulse: 81 91 91 88  Resp: 14 18 16 14   Temp: (!)  97.4 F (36.3 C) 98.4 F (36.9 C) 99 F (37.2 C) 98.4 F (36.9 C)  TempSrc: Oral Oral Oral Oral  SpO2: 98% 99% 97% 98%  Weight:      Height:        Intake/Output Summary (Last 24 hours) at 01/12/2020 0941 Last data filed at 01/11/2020 1700 Gross per 24 hour  Intake 240 ml  Output --  Net 240 ml   Filed Weights   01/06/20 0500 01/07/20 0651 01/09/20 0713  Weight: 58.1 kg 59.3 kg 58 kg    Examination:  Constitutional: NAD, comfortable Eyes: no icterus  ENMT: mmm Neck: normal, supple Respiratory: CTA biL, no wheezing  Cardiovascular: RRR, 3/6 SEM, no edema Abdomen: non distended BS+ Skin: New worsening petechial rash, nonblanching, bilateral lower extremities up to the knees, improving Neurologic: non focal    Data Reviewed: I have independently reviewed following labs and imaging studies   CBC: Recent Labs  Lab 01/09/20 0548 01/10/20 0646  WBC 12.7* 10.8*  NEUTROABS  --  6.5  HGB 9.9* 9.1*  HCT 33.4* 29.5*  MCV 87.2 84.5  PLT 300 286   Basic Metabolic Panel: Recent Labs  Lab 01/07/20 0438 01/09/20 0548 01/10/20 0646  NA  --  136 135  K  --  5.0 4.4  CL  --  100 102  CO2  --  26 26  GLUCOSE  --  92 101*  BUN  --  31* 27*  CREATININE 1.33* 1.31* 1.20  CALCIUM  --  9.7 9.3   Liver Function Tests: Recent Labs  Lab 01/10/20 0646  AST 44*  ALT 37  ALKPHOS 241*  BILITOT 0.8  PROT 8.6*  ALBUMIN 2.5*   Coagulation Profile: No results for input(s): INR, PROTIME in the last 168 hours. HbA1C: No results for input(s): HGBA1C in the last 72 hours. CBG: No results for input(s): GLUCAP in the last 168 hours.  Recent Results (from the past 240 hour(s))  Culture, blood (Routine X 2) w Reflex to ID Panel     Status: None   Collection Time: 01/05/20  3:43 PM   Specimen: BLOOD RIGHT HAND  Result Value Ref Range Status   Specimen Description   Final    BLOOD RIGHT HAND Performed at [facility], [address], [place], [place]  [address]    Special Requests   Final    BOTTLES DRAWN AEROBIC ONLY Blood Culture adequate volume Performed at [facility], [address], [place], [place] [address]    Culture   Final    NO GROWTH 5 DAYS Performed at [facility], [address], [place], [place] [address]    Report Status 01/10/2020 FINAL  Final  Culture, blood (Routine X 2) w Reflex to ID Panel     Status: None   Collection Time: 01/05/20  3:43 PM   Specimen: BLOOD RIGHT HAND  Result Value Ref Range Status   Specimen Description   Final    BLOOD RIGHT HAND Performed at [facility], [address] [place], [place] [address]    Special Requests   Final    BOTTLES DRAWN AEROBIC ONLY Blood Culture adequate volume Performed at [facility], [address], [place], [place] [address]    Culture   Final    NO GROWTH 5 DAYS Performed at [facility], [address] [place], [place] [address]    Report Status 01/10/2020 FINAL  Final     Radiology Studies: No results found.   Pamella Pert, MD, PhD [facility]  Between 7 am - 7 pm I am available, please contact me via Amion or Securechat  Between 7 pm - 7 am I am not available, please contact night coverage MD/APP via Amion

## 2020-01-13 DIAGNOSIS — E440 Moderate protein-calorie malnutrition: Secondary | ICD-10-CM

## 2020-01-13 NOTE — Progress Notes (Signed)
PROGRESS NOTE    ECHO ALLSBROOK  JSE:[id number] DOB: [birth year] DOA: 12/14/2019 PCP: Patient, No Pcp Per    Brief Narrative:  Mr. Detty was admitted to the hospital with a working diagnosis of disseminated MRSA infection, tricuspid valve endocarditis, multiple septic pulmonary emboli and paraspinal muscular myositis. Prolonged hospital stay  34 year old male with past medical history of alcohol abuse, polysubstance abuse, tricuspid valve endocarditis, depression and tobacco use who presented to the hospital with worsening fatigue over the several days.  He was recently hospitalized at another hospital where he was diagnosed with MRSA bacteremia and pulmonary embolism related to endocarditis.  He left the hospital against medical advice about 2 days of his hospitalization at [facility].  He mentioned been recently incarcerated for several months where he did use intravenous drugs. On his initial physical examination blood pressure 107/70, heart rate 87, temperature 98, respiratory rate 18, oxygen saturation 97%.  He was ill looking appearing, heart S1-S2, present, tachycardic, positive systolic murmur, positive JVD, abdomen soft, positive lower extremity edema.  Patient was started on antibiotic therapy with vancomycin.   Further work-up with echocardiography 12/14/19 showed preserved LV systolic function with 60 to 65% ejection fraction, right ventricle with decreased systolic function, severely enlarge cavity, small pericardial effusion. Tricuspid valve with large mobile vegetation on the septal and lateral leaflet with leaflet destruction failure to coapt leading to wide open tricuspid regurgitation.   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 He was treated with intravenous furosemide with good toleration.  He did require 1 unit packed red blood cells for symptomatic anemia.  His antibiotic regimen was changed to daptomycin due to rash on  10/25.  Patient was evaluated by cardiothoracic surgery, he was found not candidate for tricuspid valve repair, or Angiomax.  Plan to repeat lumbar MRI in 3 to 4 weeks to rule out deep infection.  Plan to complete antibiotic therapy on 12/05.   Assessment & Plan:   Principal Problem:   Endocarditis Active Problems:   Polysubstance dependence (HCC)   IVDU (intravenous drug user)   Acute CHF (congestive heart failure) (HCC)   Anemia   Acute septic pulmonary embolism (HCC)   Goals of care, counseling/discussion   Palliative care encounter   Chronic hepatitis C without hepatic coma (HCC)   Infective myositis   Pleural effusion, bilateral   Malnutrition of moderate degree   Multifocal pneumonia   MRSA bacteremia   Leukocytoclastic vasculitis (HCC)  1. 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  Continue antibiotic therapy with daptomycin, plan to complete therapy on 12/05.  He has been afebrile, continue to have productive cough with bloody sputum.  Plan to follow up spine MRI in am, to assess paraspinal myositis.   Pain controlled with prn hydromorphone and scheduled morphine   2. Acute on chronic right sided heart failure due to severe tricuspid regurgitation.  Today patient is euvolemic, continue blood pressure monitoring. Continue with low dose spironolactone.   Currently not candidate for valve intervention.   3. Anemia (chronic). Likely related to combined iron deficiency and chronic disease, his Hgb is stable at 9,1 and Hct at 29.5  4. Hepatitis C . Will need outpatient follow up.   5. Moderate calorie protein malnutrition. Continue nutritional supplements.  6. Leukocytoclastic vasculitis. Continue with prednisone.   7. IV drug abuse. No signs of withdrawal, continue with as needed lorazepam.  Continue trazodone at night.  Status is: Inpatient  Remains inpatient appropriate because:IV treatments  appropriate due to intensity of illness or inability to take PO   Dispo: The patient is from: Home              Anticipated d/c is to: Home              Anticipated d/c date is: > 3 days              Patient currently is not medically stable to d/c.  DVT prophylaxis: Enoxaparin   Code Status:   full  Family Communication:  I spoke with patient's father at the bedside, we talked in detail about patient's condition, plan of care and prognosis and all questions were addressed.      Nutrition Status: Nutrition Problem: Moderate Malnutrition Etiology: social / environmental circumstances (polysubstance abuse, homelessness) Signs/Symptoms: mild fat depletion, moderate muscle depletion, energy intake < 75% for > or equal to 3 months Interventions: Ensure Enlive (each supplement provides 350kcal and 20 grams of protein), Magic cup, Education     Skin Documentation:     Consultants:   ID   CT surgery   Cardiology    Antimicrobials:   IV dapromycin     Subjective: Patient is awake and alert, back pain is controlled, no nausea or vomiting, continue to have dyspnea and decreased physical functional capacity. Continue to have productive cough.   Objective: Vitals:   01/12/20 1424 01/12/20 2156 01/13/20 0644 01/13/20 1346  BP: 117/76 97/69 (!) 85/65 109/80  Pulse: (!) 105 94 85 100  Resp: 18 16 16 17   Temp: 98.2 F (36.8 C) 98.7 F (37.1 C) 97.8 F (36.6 C) 97.8 F (36.6 C)  TempSrc: Oral Oral Oral Oral  SpO2: 99% 97% 96% 100%  Weight:      Height:        Intake/Output Summary (Last 24 hours) at 01/13/2020 1433 Last data filed at 01/13/2020 1100 Gross per 24 hour  Intake 119.6 ml  Output --  Net 119.6 ml   Filed Weights   01/06/20 0500 01/07/20 0651 01/09/20 0713  Weight: 58.1 kg 59.3 kg 58 kg    Examination:   General: Not in pain or dyspnea, deconditioned  Neurology: Awake and alert, non focal  E ENT: no pallor, no icterus, oral mucosa  moist Cardiovascular: No JVD. S1-S2 present, rhythmic, positive systolic murmur at the right sternal border. No lower extremity edema. Pulmonary: positive breath sounds bilaterally, adequate air movement, no wheezing, rhonchi or rales. Gastrointestinal. Abdomen soft and non tender Skin. No rashes Musculoskeletal: no joint deformities     Data Reviewed: I have personally reviewed following labs and imaging studies  CBC: Recent Labs  Lab 01/09/20 0548 01/10/20 0646  WBC 12.7* 10.8*  NEUTROABS  --  6.5  HGB 9.9* 9.1*  HCT 33.4* 29.5*  MCV 87.2 84.5  PLT 300 286   Basic Metabolic Panel: Recent Labs  Lab 01/07/20 0438 01/09/20 0548 01/10/20 0646  NA  --  136 135  K  --  5.0 4.4  CL  --  100 102  CO2  --  26 26  GLUCOSE  --  92 101*  BUN  --  31* 27*  CREATININE 1.33* 1.31* 1.20  CALCIUM  --  9.7 9.3   GFR: Estimated Creatinine Clearance: 71.2 mL/min (by C-G formula based on SCr of 1.2 mg/dL). Liver Function Tests: Recent Labs  Lab 01/10/20 0646  AST 44*  ALT 37  ALKPHOS 241*  BILITOT 0.8  PROT 8.6*  ALBUMIN 2.5*   No results for input(s): LIPASE, AMYLASE in the last 168 hours. No results for input(s): AMMONIA in the last 168 hours. Coagulation Profile: No results for input(s): INR, PROTIME in the last 168 hours. Cardiac Enzymes: Recent Labs  Lab 01/11/20 1048  CKTOTAL 25*   BNP (last 3 results) No results for input(s): PROBNP in the last 8760 hours. HbA1C: No results for input(s): HGBA1C in the last 72 hours. CBG: No results for input(s): GLUCAP in the last 168 hours. Lipid Profile: No results for input(s): CHOL, HDL, LDLCALC, TRIG, CHOLHDL, LDLDIRECT in the last 72 hours. Thyroid Function Tests: No results for input(s): TSH, T4TOTAL, FREET4, T3FREE, THYROIDAB in the last 72 hours. Anemia Panel: No results for input(s): VITAMINB12, FOLATE, FERRITIN, TIBC, IRON, RETICCTPCT in the last 72 hours.    Radiology Studies: I have reviewed all of the  imaging during this hospital visit personally     Scheduled Meds: . enoxaparin (LOVENOX) injection  40 mg Subcutaneous Q24H  . famotidine  20 mg Oral Daily  . feeding supplement  237 mL Oral TID BM  . ferrous fumarate-b12-vitamic C-folic acid  1 capsule Oral BID PC  . hydrocerin   Topical BID  . hydrOXYzine  25 mg Oral TID  . morphine  15 mg Oral Q12H  . multivitamin with minerals  1 tablet Oral Daily  . naproxen  500 mg Oral TID WC  . predniSONE  30 mg Oral Q breakfast  . senna-docusate  2 tablet Oral BID  . sodium chloride flush  3 mL Intravenous Q12H  . spironolactone  12.5 mg Oral Daily  . traZODone  50 mg Oral QHS   Continuous Infusions: . sodium chloride 250 mL (12/25/19 1321)  . DAPTOmycin (CUBICIN)  IV 500 mg (01/12/20 2138)  . lactated ringers 10 mL/hr at 01/13/20 0957     LOS: 30 days        Emad Brechtel Annett Gula, MD

## 2020-01-14 ENCOUNTER — Encounter (HOSPITAL_COMMUNITY)
Admission: EM | Disposition: A | Discharge status: Home / Self Care | Payer: Self-pay | Source: Home / Self Care | Attending: Internal Medicine

## 2020-01-14 ENCOUNTER — Encounter (HOSPITAL_COMMUNITY): Payer: Self-pay | Admitting: Internal Medicine

## 2020-01-14 ENCOUNTER — Inpatient Hospital Stay (HOSPITAL_COMMUNITY): Payer: Self-pay | Admitting: Certified Registered Nurse Anesthetist

## 2020-01-14 ENCOUNTER — Ambulatory Visit (HOSPITAL_COMMUNITY)
Admit: 2020-01-14 | Discharge: 2020-01-14 | Disposition: A | Discharge status: Home / Self Care | Payer: Self-pay | Attending: Internal Medicine | Admitting: Internal Medicine

## 2020-01-14 HISTORY — PX: RADIOLOGY WITH ANESTHESIA: SHX6223

## 2020-01-14 SURGERY — MRI WITH ANESTHESIA
Anesthesia: General

## 2020-01-14 MED ORDER — EPHEDRINE SULFATE-NACL 50-0.9 MG/10ML-% IV SOSY
PREFILLED_SYRINGE | INTRAVENOUS | Status: DC | PRN
  Administered 2020-01-14 (×2): 10 mg via INTRAVENOUS

## 2020-01-14 MED ORDER — FENTANYL CITRATE (PF) 100 MCG/2ML IJ SOLN
INTRAMUSCULAR | Status: DC | PRN
  Administered 2020-01-14: 100 ug via INTRAVENOUS

## 2020-01-14 MED ORDER — PHENYLEPHRINE 40 MCG/ML (10ML) SYRINGE FOR IV PUSH (FOR BLOOD PRESSURE SUPPORT)
PREFILLED_SYRINGE | INTRAVENOUS | Status: DC | PRN
  Administered 2020-01-14 (×4): 120 ug via INTRAVENOUS

## 2020-01-14 MED ORDER — KETAMINE HCL 50 MG/5ML IJ SOSY
PREFILLED_SYRINGE | INTRAMUSCULAR | Status: AC
  Filled 2020-01-14: qty 5

## 2020-01-14 MED ORDER — HYDROMORPHONE HCL 1 MG/ML IJ SOLN
INTRAMUSCULAR | Status: AC
  Filled 2020-01-14: qty 1

## 2020-01-14 MED ORDER — KETAMINE HCL 10 MG/ML IJ SOLN
INTRAMUSCULAR | Status: DC | PRN
  Administered 2020-01-14: 30 mg via INTRAVENOUS

## 2020-01-14 MED ORDER — CHLORHEXIDINE GLUCONATE 0.12 % MT SOLN: 15.0000 mL | OROMUCOSAL | Status: AC

## 2020-01-14 MED ORDER — LIDOCAINE 2% (20 MG/ML) 5 ML SYRINGE
INTRAMUSCULAR | Status: DC | PRN
  Administered 2020-01-14: 100 mg via INTRAVENOUS

## 2020-01-14 MED ORDER — PROPOFOL 10 MG/ML IV BOLUS
INTRAVENOUS | Status: DC | PRN
  Administered 2020-01-14: 200 mg via INTRAVENOUS

## 2020-01-14 MED ORDER — HYDROMORPHONE HCL 1 MG/ML IJ SOLN
0.2500 mg | INTRAMUSCULAR | Status: DC | PRN
  Administered 2020-01-14 (×3): 0.5 mg via INTRAVENOUS

## 2020-01-14 MED ORDER — MIDAZOLAM HCL 5 MG/5ML IJ SOLN
INTRAMUSCULAR | Status: DC | PRN
  Administered 2020-01-14: 2 mg via INTRAVENOUS

## 2020-01-14 MED ORDER — LACTATED RINGERS IV SOLN: INTRAVENOUS | Status: DC

## 2020-01-14 MED ORDER — HYDROMORPHONE HCL 1 MG/ML IJ SOLN
INTRAMUSCULAR | Status: AC
  Administered 2020-01-15: 1 mg via INTRAVENOUS
  Filled 2020-01-14: qty 1

## 2020-01-14 MED ORDER — GADOBUTROL 1 MMOL/ML IV SOLN
5.5000 mL | Freq: Once | INTRAVENOUS | Status: AC | PRN
  Administered 2020-01-14: 5.5 mL via INTRAVENOUS

## 2020-01-14 NOTE — Progress Notes (Signed)
Nutrition Follow-up  DOCUMENTATION CODES:   Non-severe (moderate) malnutrition in context of social or environmental circumstances  INTERVENTION:   -Ensure Enlive poTID, each supplement provides 350 kcal and 20 grams of protein -Magic cupBID with meals, each supplement provides 290 kcal and 9 grams of protein  NUTRITION DIAGNOSIS:   Moderate Malnutrition related to social / environmental circumstances (polysubstance abuse, homelessness) as evidenced by mild fat depletion, moderate muscle depletion, energy intake < 75% for > or equal to 3 months.  Ongoing.  GOAL:   Patient will meet greater than or equal to 90% of their needs  Progressing.  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  Patient admitted with significant bilateral leg swelling, shortness of breath, positional back pain.  Chest x-ray was concerning for septic emboli and possibly superimposed pneumonia.  Pt is s/p thoracic and lumbar spine MRI today.  Pt has been consuming 50-100% of meals. Over the last couple of days only 1 Ensure has been accepted by pt.   Pt expected to continue IV antibiotics until 12/5.    Admission weight: 119 lbs. Current weight: 127 lbs.  I/Os: +6.6L since 10/21.  Labs reviewed. Medications: FOLTRIN (B-12, iron, Vit C, folic acid),Multivitamin with minerals daily, Senokot, Lactated ringers  Diet Order:   Diet Order            Diet regular Room service appropriate? Yes; Fluid consistency: Thin  Diet effective now                 EDUCATION NEEDS:   Education needs have been addressed  Skin:  Skin Assessment: Reviewed RN Assessment  Last BM:   11/2  Height:   Ht Readings from Last 1 Encounters:  01/14/20 5\' 8"  (1.727 m)    Weight:   Wt Readings from Last 1 Encounters:  01/14/20 58 kg   BMI:  Body mass index is 19.44 kg/m.  Estimated Nutritional Needs:   Kcal:  1950-2150  Protein:  105-115g  Fluid:  2L/day  13/04/21, MS, RD, LDN Inpatient Clinical Dietitian Contact information available via Amion

## 2020-01-14 NOTE — Anesthesia Procedure Notes (Signed)
Procedure Name: LMA Insertion Date/Time: 01/14/2020 9:57 AM Performed by: Sheppard Evens, CRNA Pre-anesthesia Checklist: Patient identified, Emergency Drugs available, Suction available and Patient being monitored Patient Re-evaluated:Patient Re-evaluated prior to induction Oxygen Delivery Method: Circle System Utilized Preoxygenation: Pre-oxygenation with 100% oxygen Induction Type: IV induction Ventilation: Mask ventilation without difficulty LMA: LMA inserted LMA Size: 4.0 Number of attempts: 1 Airway Equipment and Method: Bite block Placement Confirmation: positive ETCO2 Tube secured with: Tape Dental Injury: Teeth and Oropharynx as per pre-operative assessment

## 2020-01-14 NOTE — Anesthesia Preprocedure Evaluation (Signed)
Anesthesia Evaluation  Patient identified by MRN, date of birth, ID band Patient awake    Reviewed: Allergy & Precautions, NPO status , Patient's Chart, lab work & pertinent test results  Airway Mallampati: II  TM Distance: >3 FB Neck ROM: Full    Dental  (+) Chipped, Missing   Pulmonary asthma , pneumonia, Current Smoker and Patient abstained from smoking.,  10 pack year history    Pulmonary exam normal breath sounds clear to auscultation       Cardiovascular +CHF  Normal cardiovascular exam+ Valvular Problems/Murmurs (Endocarditis of tricuspid valve)  Rhythm:Regular Rate:Normal  Echo 12/2019 1. Left ventricular ejection fraction, by estimation, is 60 to 65%. The left ventricle has normal function. The left ventricle has no regional wall motion abnormalities. Left ventricular diastolic parameters were normal.  2. Right ventricular systolic function is mildly reduced. The right ventricular size is severely enlarged. There is mildly elevated pulmonary artery systolic pressure.  3. Right atrial size was severely dilated.  4. A small pericardial effusion is present. The pericardial effusion is posterior to the left ventricle.  5. The mitral valve is normal in structure. Trivial mitral valve regurgitation. No evidence of mitral stenosis.  6. Large mobile vegetation on the septal and lateral leaflet with leaflet destruction and failure to coapt leading to wide open TR. The tricuspid valve is abnormal. Tricuspid valve regurgitation is severe.  7. The aortic valve is normal in structure. Aortic valve regurgitation is not visualized. No aortic stenosis is present.  8. The inferior vena cava is dilated in size with <50% respiratory variability, suggesting right atrial pressure of 15 mmHg.    Neuro/Psych PSYCHIATRIC DISORDERS Depression  Back Pain    GI/Hepatic negative GI ROS, (+)     substance abuse  alcohol use, cocaine use,  marijuana use and IV drug use, Hepatitis -IV fentanyl, heroin, cocaine 6x/wk   Endo/Other  negative endocrine ROS  Renal/GU negative Renal ROS     Musculoskeletal negative musculoskeletal ROS (+) narcotic dependent  Abdominal   Peds  Hematology  (+) Blood dyscrasia, anemia ,   Anesthesia Other Findings 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, known TV endocarditis   Reproductive/Obstetrics                            Anesthesia Physical  Anesthesia Plan  ASA: III  Anesthesia Plan: General   Post-op Pain Management:    Induction: Intravenous  PONV Risk Score and Plan: 1 and Treatment may vary due to age or medical condition, Ondansetron, Dexamethasone and Midazolam  Airway Management Planned: LMA  Additional Equipment: None  Intra-op Plan:   Post-operative Plan: Extubation in OR  Informed Consent: I have reviewed the patients History and Physical, chart, labs and discussed the procedure including the risks, benefits and alternatives for the proposed anesthesia with the patient or authorized representative who has indicated his/her understanding and acceptance.     Dental advisory given  Plan Discussed with: CRNA  Anesthesia Plan Comments:         Anesthesia Quick Evaluation

## 2020-01-14 NOTE — Progress Notes (Signed)
PROGRESS NOTE    Alex [name]  LFY:[id number] DOB: 1985-06-12 DOA: 12/14/2019 PCP: Alex [name], No Pcp Per    Brief Narrative:  Alex [name] was admitted to the hospital with a working diagnosis of disseminated MRSA infection, tricuspid valve endocarditis, multiple septic pulmonary emboli and paraspinal muscular myositis. Prolonged hospital stay  34 year old male with past medical history of alcohol abuse, polysubstance abuse, tricuspid valve endocarditis, depression and tobacco use who presented to the hospital with worsening fatigue over the several days.  He was recently hospitalized at another hospital where he was diagnosed with MRSA bacteremia and pulmonary embolism related to endocarditis.  He left the hospital against medical advice about 2 days of his hospitalization at [facility].  He mentioned been recently incarcerated for several months where he did use intravenous drugs. On his initial physical examination blood pressure 107/70, heart rate 87, temperature 98, respiratory rate 18, oxygen saturation 97%.  He was ill looking appearing, heart S1-S2, present, tachycardic, positive systolic murmur, positive JVD, abdomen soft, positive lower extremity edema.  Alex [name] was started on antibiotic therapy with vancomycin.   Further work-up with echocardiography 12/14/19 showed preserved LV systolic function with 60 to 65% ejection fraction, right ventricle with decreased systolic function, severely enlarge cavity, small pericardial effusion. Tricuspid valve with large mobile vegetation on the septal and lateral leaflet with leaflet destruction failure to coapt leading to wide open tricuspid regurgitation.   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 He was treated with intravenous furosemide with good toleration.  He did require 1 unit packed red blood cells for symptomatic anemia.  His antibiotic regimen was changed to daptomycin due to rash on  10/25.  Alex [name] was evaluated by cardiothoracic surgery, he was found not candidate for tricuspid valve repair, or Angiomax.  Follow up thoracic and lumbar spine MRI with no sings of local infection.   Plan to complete antibiotic therapy on 12/05.     Assessment & Plan:   Principal Problem:   Endocarditis Active Problems:   Polysubstance dependence (HCC)   IVDU (intravenous drug user)   Acute CHF (congestive heart failure) (HCC)   Anemia   Acute septic pulmonary embolism (HCC)   Goals of care, counseling/discussion   Palliative care encounter   Chronic hepatitis C without hepatic coma (HCC)   Infective myositis   Pleural effusion, bilateral   Malnutrition of moderate degree   Multifocal pneumonia   MRSA bacteremia   Leukocytoclastic vasculitis (HCC)    1. 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  On daptomycin with good toleration, plan to complete therapy on 12/05.   Follow up spine MRI with no infection.   Continue pain control with prn hydromorphone and scheduled morphine   2. Acute on chronic right sided heart failure due to severe tricuspid regurgitation.  No signs of heart failure decompensation.    Continue with spironolactone.   Currently not candidate for valve intervention at this point per CT surgery recommendations.   3. Anemia (chronic). Likely related to combined iron deficiency and chronic disease. Stable cell counts. Continue iron supplementation.   4. Hepatitis C . Outpatient follow up.   5. Moderate calorie protein malnutrition. On nutritional supplements.  6. Leukocytoclastic vasculitis.  On prednisone. Will plan to taper in am.   7. IV drug abuse. Continue with as needed lorazepam and qhs trazodone.   8. GERD. Continue with famotidine.   Status is: Inpatient  Remains  inpatient appropriate because:IV treatments appropriate due to intensity of illness or  inability to take PO   Dispo: The Alex [name] is from: Home              Anticipated d/c is to: Home              Anticipated d/c date is: > 3 days              Alex [name] currently is not medically stable to d/c.  DVT prophylaxis: Enoxaparin   Code Status:   full  Family Communication:  I spoke with Alex [name]'s father at the bedside, we talked in detail about Alex [name]'s condition, plan of care and prognosis and all questions were addressed.      Nutrition Status: Nutrition Problem: Moderate Malnutrition Etiology: social / environmental circumstances (polysubstance abuse, homelessness) Signs/Symptoms: mild fat depletion, moderate muscle depletion, energy intake < 75% for > or equal to 3 months Interventions: Ensure Enlive (each supplement provides 350kcal and 20 grams of protein), Magic cup, Education     Skin Documentation:     Consultants:   ID   CT surgery    Antimicrobials:   daptomycin     Subjective: Alex [name] is feeling well, back pain is controlled, no nausea or vomiting, tolearating po well,. No dyspnea or chest pain,   Objective: Vitals:   01/14/20 1211 01/14/20 1215 01/14/20 1226 01/14/20 1310  BP: 105/69  105/68 101/68  Pulse: 88 91 88 87  Resp: 12 15 17 16   Temp:   97.8 F (36.6 C) 98.2 F (36.8 C)  TempSrc:    Oral  SpO2: 97% 97% 98% 98%  Weight:      Height:        Intake/Output Summary (Last 24 hours) at 01/14/2020 1542 Last data filed at 01/14/2020 1119 Gross per 24 hour  Intake 1050 ml  Output --  Net 1050 ml   Filed Weights   01/07/20 0651 01/09/20 0713 01/14/20 0731  Weight: 59.3 kg 58 kg 58 kg    Examination:   General: Not in pain or dyspnea  Neurology: Awake and alert, non focal  E ENT: no pallor, no icterus, oral mucosa moist Cardiovascular: No JVD. S1-S2 present, rhythmic, positive systolic murmurs right parasternal . No lower extremity edema. Pulmonary: positive breath sounds bilaterally,  Gastrointestinal. Abdomen soft and non  tender Skin. No rashes Musculoskeletal: no joint deformities     Data Reviewed: I have personally reviewed following labs and imaging studies  CBC: Recent Labs  Lab 01/09/20 0548 01/10/20 0646  WBC 12.7* 10.8*  NEUTROABS  --  6.5  HGB 9.9* 9.1*  HCT 33.4* 29.5*  MCV 87.2 84.5  PLT 300 286   Basic Metabolic Panel: Recent Labs  Lab 01/09/20 0548 01/10/20 0646  NA 136 135  K 5.0 4.4  CL 100 102  CO2 26 26  GLUCOSE 92 101*  BUN 31* 27*  CREATININE 1.31* 1.20  CALCIUM 9.7 9.3   GFR: Estimated Creatinine Clearance: 71.2 mL/min (by C-G formula based on SCr of 1.2 mg/dL). Liver Function Tests: Recent Labs  Lab 01/10/20 0646  AST 44*  ALT 37  ALKPHOS 241*  BILITOT 0.8  PROT 8.6*  ALBUMIN 2.5*   No results for input(s): LIPASE, AMYLASE in the last 168 hours. No results for input(s): AMMONIA in the last 168 hours. Coagulation Profile: No results for input(s): INR, PROTIME in the last 168 hours. Cardiac Enzymes: Recent Labs  Lab 01/11/20 1048  CKTOTAL 25*   BNP (  last 3 results) No results for input(s): PROBNP in the last 8760 hours. HbA1C: No results for input(s): HGBA1C in the last 72 hours. CBG: No results for input(s): GLUCAP in the last 168 hours. Lipid Profile: No results for input(s): CHOL, HDL, LDLCALC, TRIG, CHOLHDL, LDLDIRECT in the last 72 hours. Thyroid Function Tests: No results for input(s): TSH, T4TOTAL, FREET4, T3FREE, THYROIDAB in the last 72 hours. Anemia Panel: No results for input(s): VITAMINB12, FOLATE, FERRITIN, TIBC, IRON, RETICCTPCT in the last 72 hours.    Radiology Studies: I have reviewed all of the imaging during this hospital visit personally     Scheduled Meds:  chlorhexidine  15 mL Mouth/Throat NOW   enoxaparin (LOVENOX) injection  40 mg Subcutaneous Q24H   famotidine  20 mg Oral Daily   feeding supplement  237 mL Oral TID BM   ferrous fumarate-b12-vitamic C-folic acid  1 capsule Oral BID PC   hydrocerin    Topical BID   HYDROmorphone       HYDROmorphone       hydrOXYzine  25 mg Oral TID   morphine  15 mg Oral Q12H   multivitamin with minerals  1 tablet Oral Daily   naproxen  500 mg Oral TID WC   predniSONE  30 mg Oral Q breakfast   senna-docusate  2 tablet Oral BID   sodium chloride flush  3 mL Intravenous Q12H   spironolactone  12.5 mg Oral Daily   traZODone  50 mg Oral QHS   Continuous Infusions:  sodium chloride 250 mL (12/25/19 1321)   DAPTOmycin (CUBICIN)  IV 500 mg (01/13/20 1956)   lactated ringers 10 mL/hr at 01/14/20 0940   lactated ringers 10 mL/hr at 01/14/20 0734     LOS: 31 days        Ahaan Zobrist Annett Gula, MD

## 2020-01-14 NOTE — Progress Notes (Signed)
[facility]    Date of Admission:  12/14/2019    ID: Alex [name] is a 34 y.o. male with   Principal Problem:   Endocarditis Active Problems:   Polysubstance dependence (HCC)   IVDU (intravenous drug user)   Acute CHF (congestive heart failure) (HCC)   Anemia   Acute septic pulmonary embolism (HCC)   Goals of care, counseling/discussion   Palliative care encounter   Chronic hepatitis C without hepatic coma (HCC)   Infective myositis   Pleural effusion, bilateral   Malnutrition of moderate degree   Multifocal pneumonia   MRSA bacteremia   Leukocytoclastic vasculitis (HCC)    Subjective: Underwent thoracic and lumbar mri which did not show any fluid collections. He reports rash is improving  Medications:  . chlorhexidine  15 mL Mouth/Throat NOW  . enoxaparin (LOVENOX) injection  40 mg Subcutaneous Q24H  . famotidine  20 mg Oral Daily  . feeding supplement  237 mL Oral TID BM  . ferrous fumarate-b12-vitamic C-folic acid  1 capsule Oral BID PC  . hydrocerin   Topical BID  . HYDROmorphone      . HYDROmorphone      . hydrOXYzine  25 mg Oral TID  . morphine  15 mg Oral Q12H  . multivitamin with minerals  1 tablet Oral Daily  . naproxen  500 mg Oral TID WC  . predniSONE  30 mg Oral Q breakfast  . senna-docusate  2 tablet Oral BID  . sodium chloride flush  3 mL Intravenous Q12H  . spironolactone  12.5 mg Oral Daily  . traZODone  50 mg Oral QHS    Objective: Vital signs in last 24 hours: Temp:  [97.7 F (36.5 C)-98.2 F (36.8 C)] 98.2 F (36.8 C) (11/04 1310) Pulse Rate:  [72-92] 87 (11/04 1310) Resp:  [9-17] 16 (11/04 1310) BP: (90-107)/(61-73) 101/68 (11/04 1310) SpO2:  [94 %-99 %] 98 % (11/04 1310) Weight:  [58 kg] 58 kg (11/04 0731) Physical Exam  Constitutional: He is oriented to person, place, and time. He appears well-developed and well-nourished. No distress.  HENT:  Mouth/Throat: Oropharynx is clear and moist. No  oropharyngeal exudate.  Cardiovascular: Normal rate, regular rhythm and normal heart sounds. Exam reveals no gallop and no friction rub.  No murmur heard.  Pulmonary/Chest: Effort normal and breath sounds normal. No respiratory distress. He has no wheezes.  Abdominal: Soft. Bowel sounds are normal. He exhibits no distension. There is no tenderness.  Lymphadenopathy:  He has no cervical adenopathy.  Neurological: He is alert and oriented to person, place, and time.  Skin: Skin is warm and dry. +petechail rash Psychiatric: He has a normal mood and affect. His behavior is normal.     Lab Results No results for input(s): WBC, HGB, HCT, NA, K, CL, CO2, BUN, CREATININE, GLU in the last 72 hours.  Invalid input(s): PLATELETS Liver Panel No results for input(s): PROT, ALBUMIN, AST, ALT, ALKPHOS, BILITOT, BILIDIR, IBILI in the last 72 hours. Sedimentation Rate No results for input(s): ESRSEDRATE in the last 72 hours. C-Reactive Protein No results for input(s): CRP in the last 72 hours.  Microbiology: reviewed Studies/Results: MR THORACIC SPINE W WO CONTRAST  Result Date: 01/14/2020 CLINICAL DATA:  Epidural abscess. History of IV drug abuse. Recent MRSA bacteremia and endocarditis. EXAM: MRI THORACIC AND LUMBAR SPINE WITHOUT AND WITH CONTRAST TECHNIQUE: Multiplanar and multiecho pulse sequences of the thoracic and lumbar spine were obtained without and with intravenous contrast. CONTRAST:  5.34mL GADAVIST GADOBUTROL 1 MMOL/ML IV SOLN COMPARISON:  None. FINDINGS: MRI THORACIC SPINE FINDINGS Alignment:  Normal Vertebrae: Normal bone marrow. Negative for fracture or mass. Negative for spinal infection. Cord: Normal spinal cord signal. No abnormal enhancement of the cord. No cord compression. No epidural abscess identified. Paraspinal and other soft tissues: Negative for paraspinous mass. No epidural abscess or fluid collection Patchy bilateral lung infiltrates in lung nodules compatible with  infection. See chest CT report 12/22/2019 Disc levels: Normal disc spaces.  Negative for disc degeneration or infection. MRI LUMBAR SPINE FINDINGS Segmentation:  Normal Alignment:  Normal Vertebrae: Normal bone marrow. Negative for fracture, mass, or infection. Conus medullaris: Extends to the L1-2 level and appears normal. Paraspinal and other soft tissues: Negative for paraspinous mass or fluid collection. Negative for abscess. Disc levels: Small central and left-sided disc protrusion L5-S1. Mild flattening left S1 nerve root. Remaining disc spaces normal. IMPRESSION: No evidence of spinal infection in the thoracic or lumbar spine. No epidural abscess or diskitis identified. Small central and left-sided disc protrusion L5-S1. Electronically Signed   By: Marlan Palau M.D.   On: 01/14/2020 15:10   MR Lumbar Spine W Wo Contrast  Result Date: 01/14/2020 CLINICAL DATA:  Epidural abscess. History of IV drug abuse. Recent MRSA bacteremia and endocarditis. EXAM: MRI THORACIC AND LUMBAR SPINE WITHOUT AND WITH CONTRAST TECHNIQUE: Multiplanar and multiecho pulse sequences of the thoracic and lumbar spine were obtained without and with intravenous contrast. CONTRAST:  5.37mL GADAVIST GADOBUTROL 1 MMOL/ML IV SOLN COMPARISON:  None. FINDINGS: MRI THORACIC SPINE FINDINGS Alignment:  Normal Vertebrae: Normal bone marrow. Negative for fracture or mass. Negative for spinal infection. Cord: Normal spinal cord signal. No abnormal enhancement of the cord. No cord compression. No epidural abscess identified. Paraspinal and other soft tissues: Negative for paraspinous mass. No epidural abscess or fluid collection Patchy bilateral lung infiltrates in lung nodules compatible with infection. See chest CT report 12/22/2019 Disc levels: Normal disc spaces.  Negative for disc degeneration or infection. MRI LUMBAR SPINE FINDINGS Segmentation:  Normal Alignment:  Normal Vertebrae: Normal bone marrow. Negative for fracture, mass, or  infection. Conus medullaris: Extends to the L1-2 level and appears normal. Paraspinal and other soft tissues: Negative for paraspinous mass or fluid collection. Negative for abscess. Disc levels: Small central and left-sided disc protrusion L5-S1. Mild flattening left S1 nerve root. Remaining disc spaces normal. IMPRESSION: No evidence of spinal infection in the thoracic or lumbar spine. No epidural abscess or diskitis identified. Small central and left-sided disc protrusion L5-S1. Electronically Signed   By: Marlan Palau M.D.   On: 01/14/2020 15:10     Assessment/Plan: mrsa bacteremia/TV endocarditis/ pulmonary cavitary pneumonia/ spinal myositis c/b leukocytoclastic vasculitis   - continue with daptomycin IV daily  - will need weekly CK  Leukocytoclastic vasculitis = continue on prednisone 30mg  daily, appears to be improving. Would like to have him on it through 11/8 then can consider changing dose   Then decide to finish out abtx course of 6 wk with oral abtx  [facility] Cell: [phone] Pager: [phone]  01/14/2020, 6:32 PM

## 2020-01-14 NOTE — Transfer of Care (Signed)
Immediate Anesthesia Transfer of Care Note  Patient: Alex [name]  Procedure(s) Performed: MRI WITH ANESTHESIA OF THORACIC SPINE AND LUMBAR SPINE (N/A )  Patient Location: PACU  Anesthesia Type:General  Level of Consciousness: responds to stimulation  Airway & Oxygen Therapy: Patient Spontanous Breathing  Post-op Assessment: Report given to RN and Post -op Vital signs reviewed and stable  Post vital signs: Reviewed and stable  Last Vitals:  Vitals Value Taken Time  BP 107/69 01/14/20 1126  Temp    Pulse 84 01/14/20 1128  Resp 11 01/14/20 1128  SpO2 97 % 01/14/20 1128  Vitals shown include unvalidated device data.  Last Pain:  Vitals:   01/14/20 0546  TempSrc: Oral  PainSc:       Patients Stated Pain Goal: 2 (01/13/20 1945)  Complications: No complications documented.

## 2020-01-15 ENCOUNTER — Encounter (HOSPITAL_COMMUNITY): Payer: Self-pay | Admitting: Radiology

## 2020-01-15 NOTE — Anesthesia Postprocedure Evaluation (Signed)
Anesthesia Post Note  Patient: Alex [name]  Procedure(s) Performed: MRI WITH ANESTHESIA OF THORACIC SPINE AND LUMBAR SPINE (N/A )     Patient location during evaluation: PACU Anesthesia Type: General Level of consciousness: sedated and patient cooperative Pain management: pain level controlled Vital Signs Assessment: post-procedure vital signs reviewed and stable Respiratory status: spontaneous breathing Cardiovascular status: stable Anesthetic complications: no   No complications documented.  Last Vitals:  Vitals:   01/15/20 0553 01/15/20 0958  BP: (!) 81/59 99/72  Pulse: 83 86  Resp: 17   Temp: (!) 36.4 C   SpO2: 98%     Last Pain:  Vitals:   01/15/20 0844  TempSrc:   PainSc: Asleep                 Lewie Loron

## 2020-01-15 NOTE — Progress Notes (Addendum)
PROGRESS NOTE    DAXTER PAULE  GUY:[id number] DOB: March 17, 1985 DOA: 12/14/2019 PCP: Patient, No Pcp Per    Brief Narrative:  Mr. Vanschaick was admitted to the hospital with a working diagnosis of disseminated MRSA infection, tricuspid valve endocarditis, multiple septic pulmonary emboli and paraspinal muscular myositis. Prolonged hospital stay  34 year old male with past medical history of alcohol abuse, polysubstance abuse, tricuspid valve endocarditis, depression and tobacco use who presented to the hospital with worsening fatigue over the several days. He was recently hospitalized at another hospital where he was diagnosed with MRSA bacteremia and pulmonary embolism related to endocarditis. He left the hospital against medical adviceabout 2 days of his hospitalization at [facility]. He mentioned been recently incarcerated for several months where he did use intravenous drugs. On his initial physical examination blood pressure 107/70, heart rate 87, temperature 98, respiratory rate 18, oxygen saturation 97%. He was ill looking appearing, heart S1-S2, present, tachycardic, positive systolic murmur, positive JVD, abdomen soft, positive lower extremity edema.  Patient was started on antibiotic therapy with vancomycin.  Further work-up with echocardiography 12/14/19 showed preserved LV systolic functionwith60 to 65% ejection fraction, right ventricle with decreased systolic function, severelyenlarge cavity, small pericardial effusion. Tricuspid valve withlarge mobile vegetation on the septal and lateral leaflet with leaflet destruction failure to coapt leading to wide open tricuspid regurgitation.  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 He was treated with intravenous furosemide with good toleration.  He did require 1 unit packed red blood cells for symptomatic anemia.  His antibiotic regimen was changed to daptomycin due to rash on  10/25.  Patient was evaluated by cardiothoracic surgery, he was found not candidate for tricuspid valve repair, or Angiomax.  Follow up thoracic and lumbar spine MRI with no sings of local infection.   Plan to complete antibiotic therapy on 12/05.    Assessment & Plan:   Principal Problem:   Endocarditis Active Problems:   Polysubstance dependence (HCC)   IVDU (intravenous drug user)   Acute CHF (congestive heart failure) (HCC)   Anemia   Acute septic pulmonary embolism (HCC)   Goals of care, counseling/discussion   Palliative care encounter   Chronic hepatitis C without hepatic coma (HCC)   Infective myositis   Pleural effusion, bilateral   Malnutrition of moderate degree   Multifocal pneumonia   MRSA bacteremia   Leukocytoclastic vasculitis (HCC)    1. 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  On daptomycin with good toleration, plan to complete therapy on 12/05.   11/04 follow up spine MRI with no infection.    PRN hydromorphone and scheduled morphine with good toleration.   2. Acute on chronic right sided heart failure due to severe tricuspid regurgitation.  No current signs of heart failure decompensation.   On spironolactone.   Currently not candidate for valve intervention at this point per CT surgery recommendations.   3. Anemia (chronic). Likely related to combined iron deficiency and chronic disease. On iron supplementation   4. Hepatitis C . Outpatient follow up.   5. Moderate calorie protein malnutrition.  Continue with nutritional supplements.  6. Leukocytoclastic vasculitis.  Continue with current dose of prednisone. Rash at the lower extremities clinically improving.   7. IV drug abuse. On needed lorazepam and qhs trazodone.   8. GERD. On famotidine.    Status is: Inpatient  Remains inpatient appropriate because:IV treatments appropriate due to intensity of  illness or inability to take PO   Dispo: The patient is from: Home              Anticipated d/c is to: Home              Anticipated d/c date is: > 3 days              Patient currently is not medically stable to d/c.   DVT prophylaxis: Enoxaparin   Code Status:   full  Family Communication:  No family at the bedside      Nutrition Status: Nutrition Problem: Moderate Malnutrition Etiology: social / environmental circumstances (polysubstance abuse, homelessness) Signs/Symptoms: mild fat depletion, moderate muscle depletion, energy intake < 75% for > or equal to 3 months Interventions: Ensure Enlive (each supplement provides 350kcal and 20 grams of protein), Magic cup, Education     Consultants:   ID   CT surgery    Antimicrobials:   daptomycin     Subjective: Patient continue to feel stable, no significant back pain, no nausea or vomiting.   Objective: Vitals:   01/14/20 1310 01/14/20 2130 01/15/20 0553 01/15/20 0958  BP: 101/68 107/81 (!) 81/59 99/72  Pulse: 87 83 83 86  Resp: 16 17 17    Temp: 98.2 F (36.8 C) 98 F (36.7 C) (!) 97.5 F (36.4 C)   TempSrc: Oral Oral Oral   SpO2: 98% 99% 98%   Weight:      Height:        Intake/Output Summary (Last 24 hours) at 01/15/2020 1624 Last data filed at 01/15/2020 0600 Gross per 24 hour  Intake 960 ml  Output --  Net 960 ml   Filed Weights   01/07/20 0651 01/09/20 0713 01/14/20 0731  Weight: 59.3 kg 58 kg 58 kg    Examination:   General: Not in pain or dyspnea,  Neurology: Awake and alert, non focal  E ENT: mild pallor, no icterus, oral mucosa moist Cardiovascular: No JVD. S1-S2 present, rhythmic, no gallops, rubs, or murmurs. No lower extremity edema. Pulmonary: positive breath sounds bilaterally, adequate air movement, no wheezing, rhonchi or rales. Gastrointestinal. Abdomen soft and non tender Skin. Lower extremity macules, anterior leg, non tender.  Musculoskeletal: no joint  deformities     Data Reviewed: I have personally reviewed following labs and imaging studies  CBC: Recent Labs  Lab 01/09/20 0548 01/10/20 0646  WBC 12.7* 10.8*  NEUTROABS  --  6.5  HGB 9.9* 9.1*  HCT 33.4* 29.5*  MCV 87.2 84.5  PLT 300 286   Basic Metabolic Panel: Recent Labs  Lab 01/09/20 0548 01/10/20 0646  NA 136 135  K 5.0 4.4  CL 100 102  CO2 26 26  GLUCOSE 92 101*  BUN 31* 27*  CREATININE 1.31* 1.20  CALCIUM 9.7 9.3   GFR: Estimated Creatinine Clearance: 71.2 mL/min (by C-G formula based on SCr of 1.2 mg/dL). Liver Function Tests: Recent Labs  Lab 01/10/20 0646  AST 44*  ALT 37  ALKPHOS 241*  BILITOT 0.8  PROT 8.6*  ALBUMIN 2.5*   No results for input(s): LIPASE, AMYLASE in the last 168 hours. No results for input(s): AMMONIA in the last 168 hours. Coagulation Profile: No results for input(s): INR, PROTIME in the last 168 hours. Cardiac Enzymes: Recent Labs  Lab 01/11/20 1048  CKTOTAL 25*   BNP (last 3 results) No results for input(s): PROBNP in the last 8760 hours. HbA1C: No results for input(s): HGBA1C in the last 72 hours. CBG:  No results for input(s): GLUCAP in the last 168 hours. Lipid Profile: No results for input(s): CHOL, HDL, LDLCALC, TRIG, CHOLHDL, LDLDIRECT in the last 72 hours. Thyroid Function Tests: No results for input(s): TSH, T4TOTAL, FREET4, T3FREE, THYROIDAB in the last 72 hours. Anemia Panel: No results for input(s): VITAMINB12, FOLATE, FERRITIN, TIBC, IRON, RETICCTPCT in the last 72 hours.    Radiology Studies: I have reviewed all of the imaging during this hospital visit personally     Scheduled Meds: . enoxaparin (LOVENOX) injection  40 mg Subcutaneous Q24H  . famotidine  20 mg Oral Daily  . feeding supplement  237 mL Oral TID BM  . ferrous fumarate-b12-vitamic C-folic acid  1 capsule Oral BID PC  . hydrocerin   Topical BID  . hydrOXYzine  25 mg Oral TID  . morphine  15 mg Oral Q12H  . multivitamin with  minerals  1 tablet Oral Daily  . naproxen  500 mg Oral TID WC  . predniSONE  30 mg Oral Q breakfast  . senna-docusate  2 tablet Oral BID  . sodium chloride flush  3 mL Intravenous Q12H  . spironolactone  12.5 mg Oral Daily  . traZODone  50 mg Oral QHS   Continuous Infusions: . sodium chloride 250 mL (12/25/19 1321)  . DAPTOmycin (CUBICIN)  IV 500 mg (01/14/20 2006)  . lactated ringers 10 mL/hr at 01/14/20 0940  . lactated ringers 10 mL/hr at 01/14/20 0734     LOS: 32 days        Anav Lammert Annett Gula, MD

## 2020-01-16 NOTE — Progress Notes (Signed)
PROGRESS NOTE    PARTH MCCORMAC  PIR:[id number] DOB: 1985-10-15 DOA: 12/14/2019 PCP: Patient, No Pcp Per    Brief Narrative:  Mr. Haynesworth was admitted to the hospital with a working diagnosis of disseminated MRSA infection, tricuspid valve endocarditis, multiple septic pulmonary emboli and paraspinal muscular myositis. Prolonged hospital stay  34 year old male with past medical history of alcohol abuse, polysubstance abuse, tricuspid valve endocarditis, depression and tobacco use who presented to the hospital with worsening fatigue over the several days. He was recently hospitalized at another hospital where he was diagnosed with MRSA bacteremia and pulmonary embolism related to endocarditis. He left the hospital against medical adviceabout 2 days of his hospitalization at [facility]. He mentioned been recently incarcerated for several months where he did use intravenous drugs. On his initial physical examination blood pressure 107/70, heart rate 87, temperature 98, respiratory rate 18, oxygen saturation 97%. He was ill looking appearing, heart S1-S2, present, tachycardic, positive systolic murmur, positive JVD, abdomen soft, positive lower extremity edema.  Patient was started on antibiotic therapy with vancomycin.  Further work-up with echocardiography 12/14/19 showed preserved LV systolic functionwith60 to 65% ejection fraction, right ventricle with decreased systolic function, severelyenlarge cavity, small pericardial effusion. Tricuspid valve withlarge mobile vegetation on the septal and lateral leaflet with leaflet destruction failure to coapt leading to wide open tricuspid regurgitation.  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 He was treated with intravenous furosemide with good toleration.  He did require 1 unit packed red blood cells for symptomatic anemia.  His antibiotic regimen was changed to daptomycin due to rash on  10/25.  Patient was evaluated by cardiothoracic surgery, he was found not candidate for tricuspid valve repair, or Angiomax.  Follow up thoracic and lumbar spine MRI with no sings of local infection.  Plan to complete antibiotic therapy on 12/05.   Assessment & Plan:   Principal Problem:   Endocarditis Active Problems:   Polysubstance dependence (HCC)   IVDU (intravenous drug user)   Acute CHF (congestive heart failure) (HCC)   Anemia   Acute septic pulmonary embolism (HCC)   Goals of care, counseling/discussion   Palliative care encounter   Chronic hepatitis C without hepatic coma (HCC)   Infective myositis   Pleural effusion, bilateral   Malnutrition of moderate degree   Multifocal pneumonia   MRSA bacteremia   Leukocytoclastic vasculitis (HCC)   1. 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 Continue withdaptomycin with good toleration,plan to complete therapy on 12/05.  11/04 follow up spine MRI with no infection.  Continue with PRN hydromorphone and scheduled morphine with good toleration.  Out of bed to chair tid with meals.   2. Acute on chronic right sided heart failure due to severe tricuspid regurgitation. No current signs of heart failure decompensation.   Continue with spironolactone.   Currently not candidate for valve interventionat this point per CT surgery recommendations. Will need a close follow up as outpatient.   3. Anemia (chronic).Likely related to combined iron deficiency and chronic disease. Continue with oral iron supplementation   4. Hepatitis C .Outpatient follow up.   5. Moderate calorie protein malnutrition. On nutritional supplements.  6. Leukocytoclastic vasculitis.Onprednisone with good toleration, rash at the lower extremities continue improving.   7. IV drug abuse.Continue with PRNlorazepamand qhs trazodone.   8. GERD. Continue  with famotidine.  Status is: Inpatient  Remains inpatient appropriate because:IV treatments appropriate due to intensity  of illness or inability to take PO   Dispo: The patient is from: Home              Anticipated d/c is to: Home              Anticipated d/c date is: > 3 days              Patient currently is not medically stable to d/c.   DVT prophylaxis: Enoxaparin   Code Status:   full Family Communication:  I spoke with patient's father at the bedside, we talked in detail about patient's condition, plan of care and prognosis and all questions were addressed.      Nutrition Status: Nutrition Problem: Moderate Malnutrition Etiology: social / environmental circumstances (polysubstance abuse, homelessness) Signs/Symptoms: mild fat depletion, moderate muscle depletion, energy intake < 75% for > or equal to 3 months Interventions: Ensure Enlive (each supplement provides 350kcal and 20 grams of protein), Magic cup, Education    Consultants:  ID   CT surgery   Antimicrobials:  daptomycin   Subjective: Patient feeling well, no nausea or vomiting ,no dyspnea or edema. Lower extremity rash is improving.   Objective: Vitals:   01/15/20 0958 01/15/20 2103 01/16/20 0606 01/16/20 1321  BP: 99/72 115/82 97/67 107/77  Pulse: 86 78 75 78  Resp:  19 19 18   Temp:  (!) 97.4 F (36.3 C) 97.7 F (36.5 C) 97.9 F (36.6 C)  TempSrc:  Oral  Oral  SpO2:  99% 98% 100%  Weight:      Height:        Intake/Output Summary (Last 24 hours) at 01/16/2020 1503 Last data filed at 01/16/2020 0300 Gross per 24 hour  Intake 1134.33 ml  Output --  Net 1134.33 ml   Filed Weights   01/07/20 0651 01/09/20 0713 01/14/20 0731  Weight: 59.3 kg 58 kg 58 kg    Examination:   General: Not in pain or dyspnea,  Neurology: Awake and alert, non focal  E ENT: no pallor, no icterus, oral mucosa moist Cardiovascular: No JVD. S1-S2 present, rhythmic, no gallops, positive systolic  murmurs. No lower extremity edema. Pulmonary: positive breath sounds bilaterally, Gastrointestinal. Abdomen soft and non tender Skin. No rashes Musculoskeletal: no joint deformities     Data Reviewed: I have personally reviewed following labs and imaging studies  CBC: Recent Labs  Lab 01/10/20 0646  WBC 10.8*  NEUTROABS 6.5  HGB 9.1*  HCT 29.5*  MCV 84.5  PLT 286   Basic Metabolic Panel: Recent Labs  Lab 01/10/20 0646  NA 135  K 4.4  CL 102  CO2 26  GLUCOSE 101*  BUN 27*  CREATININE 1.20  CALCIUM 9.3   GFR: Estimated Creatinine Clearance: 71.2 mL/min (by C-G formula based on SCr of 1.2 mg/dL). Liver Function Tests: Recent Labs  Lab 01/10/20 0646  AST 44*  ALT 37  ALKPHOS 241*  BILITOT 0.8  PROT 8.6*  ALBUMIN 2.5*   No results for input(s): LIPASE, AMYLASE in the last 168 hours. No results for input(s): AMMONIA in the last 168 hours. Coagulation Profile: No results for input(s): INR, PROTIME in the last 168 hours. Cardiac Enzymes: Recent Labs  Lab 01/11/20 1048  CKTOTAL 25*   BNP (last 3 results) No results for input(s): PROBNP in the last 8760 hours. HbA1C: No results for input(s): HGBA1C in the last 72 hours. CBG: No results for input(s): GLUCAP in the last 168 hours. Lipid Profile: No results for input(s): CHOL, HDL,  LDLCALC, TRIG, CHOLHDL, LDLDIRECT in the last 72 hours. Thyroid Function Tests: No results for input(s): TSH, T4TOTAL, FREET4, T3FREE, THYROIDAB in the last 72 hours. Anemia Panel: No results for input(s): VITAMINB12, FOLATE, FERRITIN, TIBC, IRON, RETICCTPCT in the last 72 hours.    Radiology Studies: I have reviewed all of the imaging during this hospital visit personally     Scheduled Meds: . enoxaparin (LOVENOX) injection  40 mg Subcutaneous Q24H  . famotidine  20 mg Oral Daily  . feeding supplement  237 mL Oral TID BM  . ferrous fumarate-b12-vitamic C-folic acid  1 capsule Oral BID PC  . hydrocerin   Topical BID   . hydrOXYzine  25 mg Oral TID  . morphine  15 mg Oral Q12H  . multivitamin with minerals  1 tablet Oral Daily  . naproxen  500 mg Oral TID WC  . predniSONE  30 mg Oral Q breakfast  . senna-docusate  2 tablet Oral BID  . sodium chloride flush  3 mL Intravenous Q12H  . spironolactone  12.5 mg Oral Daily  . traZODone  50 mg Oral QHS   Continuous Infusions: . sodium chloride 250 mL (12/25/19 1321)  . DAPTOmycin (CUBICIN)  IV Stopped (01/15/20 2201)  . lactated ringers 10 mL/hr at 01/14/20 0940  . lactated ringers 10 mL/hr at 01/14/20 0734     LOS: 33 days        Yamin Swingler Annett Gula, MD

## 2020-01-17 LAB — CBC WITH DIFFERENTIAL/PLATELET
Abs Immature Granulocytes: 0.04 10*3/uL (ref 0.00–0.07)
Basophils Absolute: 0.1 10*3/uL (ref 0.0–0.1)
Basophils Relative: 1 %
Eosinophils Absolute: 0.1 10*3/uL (ref 0.0–0.5)
Eosinophils Relative: 1 %
HCT: 32.3 % — ABNORMAL LOW (ref 39.0–52.0)
Hemoglobin: 9.6 g/dL — ABNORMAL LOW (ref 13.0–17.0)
Immature Granulocytes: 0 %
Lymphocytes Relative: 32 %
Lymphs Abs: 3.7 10*3/uL (ref 0.7–4.0)
MCH: 25.8 pg — ABNORMAL LOW (ref 26.0–34.0)
MCHC: 29.7 g/dL — ABNORMAL LOW (ref 30.0–36.0)
MCV: 86.8 fL (ref 80.0–100.0)
Monocytes Absolute: 0.8 10*3/uL (ref 0.1–1.0)
Monocytes Relative: 7 %
Neutro Abs: 6.9 10*3/uL (ref 1.7–7.7)
Neutrophils Relative %: 59 %
Platelets: 224 10*3/uL (ref 150–400)
RBC: 3.72 MIL/uL — ABNORMAL LOW (ref 4.22–5.81)
RDW: 17.4 % — ABNORMAL HIGH (ref 11.5–15.5)
WBC: 11.6 10*3/uL — ABNORMAL HIGH (ref 4.0–10.5)
nRBC: 0 % (ref 0.0–0.2)

## 2020-01-17 LAB — COMPREHENSIVE METABOLIC PANEL
ALT: 41 U/L (ref 0–44)
AST: 31 U/L (ref 15–41)
Albumin: 2.6 g/dL — ABNORMAL LOW (ref 3.5–5.0)
Alkaline Phosphatase: 189 U/L — ABNORMAL HIGH (ref 38–126)
Anion gap: 6 (ref 5–15)
BUN: 28 mg/dL — ABNORMAL HIGH (ref 6–20)
CO2: 24 mmol/L (ref 22–32)
Calcium: 8.9 mg/dL (ref 8.9–10.3)
Chloride: 106 mmol/L (ref 98–111)
Creatinine, Ser: 1.14 mg/dL (ref 0.61–1.24)
GFR, Estimated: >60 mL/min (ref >60)
Glucose, Bld: 94 mg/dL (ref 70–99)
Potassium: 4.5 mmol/L (ref 3.5–5.1)
Sodium: 136 mmol/L (ref 135–145)
Total Bilirubin: 0.5 mg/dL (ref 0.3–1.2)
Total Protein: 7.8 g/dL (ref 6.5–8.1)

## 2020-01-17 LAB — C-REACTIVE PROTEIN: CRP: 2.4 mg/dL — ABNORMAL HIGH (ref <1.0)

## 2020-01-17 NOTE — Progress Notes (Signed)
PROGRESS NOTE    Alex [name]  ACZ:[id number] DOB: [birth year] DOA: 12/14/2019 PCP: Patient, No Pcp Per    Brief Narrative:  Alex [name] was admitted to the hospital with a working diagnosis of disseminated MRSA infection, tricuspid valve endocarditis, multiple septic pulmonary emboli and paraspinal muscular myositis. Prolonged hospital stay  34 year old male with past medical history of alcohol abuse, polysubstance abuse, tricuspid valve endocarditis, depression and tobacco use who presented to the hospital with worsening fatigue over the several days. He was recently hospitalized at another hospital where he was diagnosed with MRSA bacteremia and pulmonary embolism related to endocarditis. He left the hospital against medical adviceabout 2 days of his hospitalization at [facility]. He mentioned been recently incarcerated for several months where he did use intravenous drugs. On his initial physical examination blood pressure 107/70, heart rate 87, temperature 98, respiratory rate 18, oxygen saturation 97%. He was ill looking appearing, heart S1-S2, present, tachycardic, positive systolic murmur, positive JVD, abdomen soft, positive lower extremity edema.  Patient was started on antibiotic therapy with vancomycin.  Further work-up with echocardiography 12/14/19 showed preserved LV systolic functionwith60 to 65% ejection fraction, right ventricle with decreased systolic function, severelyenlarge cavity, small pericardial effusion. Tricuspid valve withlarge mobile vegetation on the septal and lateral leaflet with leaflet destruction failure to coapt leading to wide open tricuspid regurgitation.  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 He was treated with intravenous furosemide with good toleration.  He did require 1 unit packed red blood cells for symptomatic anemia.  His antibiotic regimen was changed to daptomycin due to rash on  10/25.  Patient was evaluated by cardiothoracic surgery, he was found not candidate for tricuspid valve repair, or Angiomax.  Follow up thoracic and lumbar spine MRI with no sings of local infection.  Plan to complete antibiotic therapy on 12/05.   Assessment & Plan:   Principal Problem:   Endocarditis Active Problems:   Polysubstance dependence (HCC)   IVDU (intravenous drug user)   Acute CHF (congestive heart failure) (HCC)   Anemia   Acute septic pulmonary embolism (HCC)   Goals of care, counseling/discussion   Palliative care encounter   Chronic hepatitis C without hepatic coma (HCC)   Infective myositis   Pleural effusion, bilateral   Malnutrition of moderate degree   Multifocal pneumonia   MRSA bacteremia   Leukocytoclastic vasculitis (HCC)    1. 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 Continue withdaptomycin with good toleration,plan to complete therapy on 12/05.  11/04 follow up spine MRI with no infection.  Pain is well controlled with PRNhydromorphone (#4 doses yesterday) and scheduled morphine 15 mg bid  Continue to encourage out of bed to the chair and ambulation.   2. Acute on chronic right sided heart failure due to severe tricuspid regurgitation. Nocurrentsigns of heart failure decompensation.   No clinical sings of decompensation, continue with spironolactone.   Currently not candidate for valve interventionat this point per CT surgery recommendations.  3. Anemia (chronic).Likely related to combined iron deficiency and chronic disease. On oral iron supplementation  4. Hepatitis C .Outpatient follow up.   5. Moderate calorie protein malnutrition.continue with nutritional supplements.  6. Leukocytoclastic vasculitis.Continue withprednisone, rash continue to improve.   7. IV drug abuse. continue with qhs trazodone. Last dose of lorazepam 10/17,  will discontinue this agent.   8. GERD.On famotidine.   Status is: Inpatient  Remains inpatient appropriate because:IV treatments  appropriate due to intensity of illness or inability to take PO   Dispo: The patient is from: Home              Anticipated d/c is to: Home              Anticipated d/c date is: 2 days              Patient currently is not medically stable to d/c.  Pending final recommendations from ID.    DVT prophylaxis: Enoxaparin   Code Status:   full  Family Communication:  I spoke with his father yesterday.      Nutrition Status: Nutrition Problem: Moderate Malnutrition Etiology: social / environmental circumstances (polysubstance abuse, homelessness) Signs/Symptoms: mild fat depletion, moderate muscle depletion, energy intake < 75% for > or equal to 3 months Interventions: Ensure Enlive (each supplement provides 350kcal and 20 grams of protein), Magic cup, Education     Consultants:  ID   CT surgery   Antimicrobials:  daptomycin   Subjective: Patient is feeling well, no nausea or vomiting, no chest pain or dyspnea.   Objective: Vitals:   01/15/20 2103 01/16/20 0606 01/16/20 1321 01/16/20 2116  BP: 115/82 97/67 107/77 103/71  Pulse: 78 75 78 89  Resp: 19 19 18 18   Temp:  97.7 F (36.5 C) 97.9 F (36.6 C) 97.8 F (36.6 C)  TempSrc: Oral  Oral Oral  SpO2: 99% 98% 100% 98%  Weight:      Height:        Intake/Output Summary (Last 24 hours) at 01/17/2020 1406 Last data filed at 01/16/2020 2116 Gross per 24 hour  Intake 480 ml  Output --  Net 480 ml   Filed Weights   01/07/20 0651 01/09/20 0713 01/14/20 0731  Weight: 59.3 kg 58 kg 58 kg    Examination:   General: Not in pain or dyspnea.   Neurology: Awake and alert, non focal  E ENT: no pallor, no icterus, oral mucosa moist Cardiovascular: No JVD. S1-S2 present, rhythmic,systolic murmur. No lower extremity edema. Pulmonary: positive breath sounds bilaterally,   Gastrointestinal. Abdomen soft and non tender Skin. No rashes Musculoskeletal: no joint deformities     Data Reviewed: I have personally reviewed following labs and imaging studies  CBC: Recent Labs  Lab 01/17/20 0524  WBC 11.6*  NEUTROABS 6.9  HGB 9.6*  HCT 32.3*  MCV 86.8  PLT 224   Basic Metabolic Panel: Recent Labs  Lab 01/17/20 0524  NA 136  K 4.5  CL 106  CO2 24  GLUCOSE 94  BUN 28*  CREATININE 1.14  CALCIUM 8.9   GFR: Estimated Creatinine Clearance: 74.9 mL/min (by C-G formula based on SCr of 1.14 mg/dL). Liver Function Tests: Recent Labs  Lab 01/17/20 0524  AST 31  ALT 41  ALKPHOS 189*  BILITOT 0.5  PROT 7.8  ALBUMIN 2.6*   No results for input(s): LIPASE, AMYLASE in the last 168 hours. No results for input(s): AMMONIA in the last 168 hours. Coagulation Profile: No results for input(s): INR, PROTIME in the last 168 hours. Cardiac Enzymes: Recent Labs  Lab 01/11/20 1048  CKTOTAL 25*   BNP (last 3 results) No results for input(s): PROBNP in the last 8760 hours. HbA1C: No results for input(s): HGBA1C in the last 72 hours. CBG: No results for input(s): GLUCAP in the last 168 hours. Lipid Profile: No results for input(s): CHOL, HDL, LDLCALC, TRIG, CHOLHDL, LDLDIRECT in the last 72 hours. Thyroid Function Tests: No  results for input(s): TSH, T4TOTAL, FREET4, T3FREE, THYROIDAB in the last 72 hours. Anemia Panel: No results for input(s): VITAMINB12, FOLATE, FERRITIN, TIBC, IRON, RETICCTPCT in the last 72 hours.    Radiology Studies: I have reviewed all of the imaging during this hospital visit personally     Scheduled Meds: . enoxaparin (LOVENOX) injection  40 mg Subcutaneous Q24H  . famotidine  20 mg Oral Daily  . feeding supplement  237 mL Oral TID BM  . ferrous fumarate-b12-vitamic C-folic acid  1 capsule Oral BID PC  . hydrocerin   Topical BID  . hydrOXYzine  25 mg Oral TID  . morphine  15 mg Oral Q12H  . multivitamin with  minerals  1 tablet Oral Daily  . naproxen  500 mg Oral TID WC  . predniSONE  30 mg Oral Q breakfast  . senna-docusate  2 tablet Oral BID  . sodium chloride flush  3 mL Intravenous Q12H  . spironolactone  12.5 mg Oral Daily  . traZODone  50 mg Oral QHS   Continuous Infusions: . sodium chloride 250 mL (12/25/19 1321)  . DAPTOmycin (CUBICIN)  IV 500 mg (01/16/20 2224)  . lactated ringers 10 mL/hr at 01/14/20 0940  . lactated ringers 10 mL/hr at 01/17/20 1015     LOS: 34 days        Dasha Kawabata Annett Gula, MD

## 2020-01-18 ENCOUNTER — Other Ambulatory Visit (HOSPITAL_COMMUNITY): Payer: Self-pay | Admitting: Internal Medicine

## 2020-01-18 LAB — CK: Total CK: 7 U/L — ABNORMAL LOW (ref 49–397)

## 2020-01-18 MED ORDER — LINEZOLID 600 MG PO TABS: 600.0000 mg | ORAL_TABLET | Freq: Two times a day (BID) | ORAL | 0 refills | Status: DC

## 2020-01-18 MED FILL — LINEZOLID 600 MG TABS: 600 | 14 days supply | Qty: 28 | Fill #0

## 2020-01-18 NOTE — Plan of Care (Signed)
BP soft this am, pt not symptomatic.  Pain remaining at 5/10 per pt.   Problem: Health Behavior/Discharge Planning: Goal: Ability to manage health-related needs will improve Outcome: Progressing   Problem: Clinical Measurements: Goal: Ability to maintain clinical measurements within normal limits will improve Outcome: Progressing Goal: Will remain free from infection Outcome: Progressing Goal: Diagnostic test results will improve Outcome: Progressing Goal: Cardiovascular complication will be avoided Outcome: Progressing   Problem: Activity: Goal: Risk for activity intolerance will decrease Outcome: Progressing   Problem: Nutrition: Goal: Adequate nutrition will be maintained Outcome: Progressing   Problem: Coping: Goal: Level of anxiety will decrease Outcome: Progressing   Problem: Safety: Goal: Ability to remain free from injury will improve Outcome: Progressing   Problem: Skin Integrity: Goal: Risk for impaired skin integrity will decrease Outcome: Progressing   Problem: Pain Managment: Goal: General experience of comfort will improve Outcome: Not Progressing

## 2020-01-18 NOTE — Progress Notes (Signed)
PROGRESS NOTE    Alex [name]  SAY:[id number] DOB: 25-Jul-1985 DOA: 12/14/2019 PCP: Patient, No Pcp Per    Brief Narrative:  Alex [name] was admitted to the hospital with a working diagnosis of disseminated MRSA infection, tricuspid valve endocarditis, multiple septic pulmonary emboli and paraspinal muscular myositis. Prolonged hospital stay  34 year old male with past medical history of alcohol abuse, polysubstance abuse, tricuspid valve endocarditis, depression and tobacco use who presented to the hospital with worsening fatigue over the several days. He was recently hospitalized at another hospital where he was diagnosed with MRSA bacteremia and pulmonary embolism related to endocarditis. He left the hospital against medical adviceabout 2 days of his hospitalization at [facility]. He mentioned been recently incarcerated for several months where he did use intravenous drugs. On his initial physical examination blood pressure 107/70, heart rate 87, temperature 98, respiratory rate 18, oxygen saturation 97%. He was ill looking appearing, heart S1-S2, present, tachycardic, positive systolic murmur, positive JVD, abdomen soft, positive lower extremity edema.  Patient was started on antibiotic therapy with vancomycin.  Further work-up with echocardiography 12/14/19 showed preserved LV systolic functionwith60 to 65% ejection fraction, right ventricle with decreased systolic function, severelyenlarge cavity, small pericardial effusion. Tricuspid valve withlarge mobile vegetation on the septal and lateral leaflet with leaflet destruction failure to coapt leading to wide open tricuspid regurgitation.  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 He was treated with intravenous furosemide with good toleration.  He did require 1 unit packed red blood cells for symptomatic anemia.  His antibiotic regimen was changed to daptomycin due to rash on  10/25.  Patient was evaluated by cardiothoracic surgery, he was found not candidate for tricuspid valve repair, or Angiomax.  Follow up thoracic and lumbar spine MRI with no sings of local infection.  Plan to complete antibiotic therapy on 12/05.    Assessment & Plan:   Principal Problem:   Endocarditis Active Problems:   Polysubstance dependence (HCC)   IVDU (intravenous drug user)   Acute CHF (congestive heart failure) (HCC)   Anemia   Acute septic pulmonary embolism (HCC)   Goals of care, counseling/discussion   Palliative care encounter   Chronic hepatitis C without hepatic coma (HCC)   Infective myositis   Pleural effusion, bilateral   Malnutrition of moderate degree   Multifocal pneumonia   MRSA bacteremia   Leukocytoclastic vasculitis (HCC)    1. 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 Continue withdaptomycin with good toleration,plan to complete therapy on 12/05.  11/04 follow up spine MRI with no infection.  Continue withPRNhydromorphone (#4 doses yesterday) and scheduled morphine 15 mg bid  At discharge will prescribe tramadol for pain control.   2. Acute on chronic right sided heart failure due to severe tricuspid regurgitation. Nocurrentsigns of heart failure decompensation.  Currently not candidate for valve interventionat this point per CT surgery recommendations.  Continue with spironolactone as outpatient, follow up with cardiology.   3. Anemia (chronic).Likely related to combined iron deficiency and chronic disease. Continue with oral iron supplementation.   4. Hepatitis C .Outpatient follow up wit ID clinic.   5. Moderate calorie protein malnutrition.On nutritional supplements.  6. Leukocytoclastic vasculitis.rash improved, will dc prednisone.    7. IV drug abuse. On qhs trazodone.  8. GERD.continue with famotidine.    Status is:  Inpatient  Remains inpatient appropriate because:Inpatient level of care appropriate due to severity of illness   Dispo:  The patient is from: Home              Anticipated d/c is to: Home              Anticipated d/c date is: 1 day              Patient currently is not medically stable to d/c.    DVT prophylaxis: Enoxaparin   Code Status:   full  Family Communication:  No family at the bedside      Nutrition Status: Nutrition Problem: Moderate Malnutrition Etiology: social / environmental circumstances (polysubstance abuse, homelessness) Signs/Symptoms: mild fat depletion, moderate muscle depletion, energy intake < 75% for > or equal to 3 months Interventions: Ensure Enlive (each supplement provides 350kcal and 20 grams of protein), Magic cup, Education      Consultants:   ID   Cardiology   CT surgery    Antimicrobials:   daptomycin     Subjective: Controlled pain, no nausea or vomiting, no dyspnea or chest pain.,   Objective: Vitals:   01/17/20 1406 01/17/20 2056 01/18/20 0549 01/18/20 1450  BP: 106/71 111/72 94/64 107/68  Pulse: 67 71 71 78  Resp: 16 16 18 16   Temp: 97.8 F (36.6 C) 98.1 F (36.7 C) 97.8 F (36.6 C) 97.9 F (36.6 C)  TempSrc: Oral Oral Oral Oral  SpO2: 99% 98% 97% 100%  Weight:   60.5 kg   Height:       No intake or output data in the 24 hours ending 01/18/20 1557 Filed Weights   01/09/20 0713 01/14/20 0731 01/18/20 0549  Weight: 58 kg 58 kg 60.5 kg    Examination:   General: Not in pain or dyspnea.  Neurology: Awake and alert, non focal  E ENT: no pallor, no icterus, oral mucosa moist Cardiovascular: No JVD. S1-S2 present, rhythmic, systolic murmur at the right sternal border. No lower extremity edema. Pulmonary: positive breath sounds bilaterally Gastrointestinal. Abdomen soft and non tender Skin. No rashes Musculoskeletal: no joint deformities     Data Reviewed: I have personally reviewed following labs and imaging  studies  CBC: Recent Labs  Lab 01/17/20 0524  WBC 11.6*  NEUTROABS 6.9  HGB 9.6*  HCT 32.3*  MCV 86.8  PLT 224   Basic Metabolic Panel: Recent Labs  Lab 01/17/20 0524  NA 136  K 4.5  CL 106  CO2 24  GLUCOSE 94  BUN 28*  CREATININE 1.14  CALCIUM 8.9   GFR: Estimated Creatinine Clearance: 78.1 mL/min (by C-G formula based on SCr of 1.14 mg/dL). Liver Function Tests: Recent Labs  Lab 01/17/20 0524  AST 31  ALT 41  ALKPHOS 189*  BILITOT 0.5  PROT 7.8  ALBUMIN 2.6*   No results for input(s): LIPASE, AMYLASE in the last 168 hours. No results for input(s): AMMONIA in the last 168 hours. Coagulation Profile: No results for input(s): INR, PROTIME in the last 168 hours. Cardiac Enzymes: Recent Labs  Lab 01/18/20 1024  CKTOTAL 7*   BNP (last 3 results) No results for input(s): PROBNP in the last 8760 hours. HbA1C: No results for input(s): HGBA1C in the last 72 hours. CBG: No results for input(s): GLUCAP in the last 168 hours. Lipid Profile: No results for input(s): CHOL, HDL, LDLCALC, TRIG, CHOLHDL, LDLDIRECT in the last 72 hours. Thyroid Function Tests: No results for input(s): TSH, T4TOTAL, FREET4, T3FREE, THYROIDAB in the last 72 hours. Anemia Panel: No results for input(s): VITAMINB12, FOLATE, FERRITIN, TIBC, IRON, RETICCTPCT in  the last 72 hours.    Radiology Studies: I have reviewed all of the imaging during this hospital visit personally     Scheduled Meds: . enoxaparin (LOVENOX) injection  40 mg Subcutaneous Q24H  . famotidine  20 mg Oral Daily  . feeding supplement  237 mL Oral TID BM  . ferrous fumarate-b12-vitamic C-folic acid  1 capsule Oral BID PC  . hydrocerin   Topical BID  . hydrOXYzine  25 mg Oral TID  . morphine  15 mg Oral Q12H  . multivitamin with minerals  1 tablet Oral Daily  . naproxen  500 mg Oral TID WC  . predniSONE  30 mg Oral Q breakfast  . senna-docusate  2 tablet Oral BID  . sodium chloride flush  3 mL Intravenous  Q12H  . spironolactone  12.5 mg Oral Daily  . traZODone  50 mg Oral QHS   Continuous Infusions: . sodium chloride 250 mL (12/25/19 1321)  . DAPTOmycin (CUBICIN)  IV 500 mg (01/17/20 2134)  . lactated ringers 10 mL/hr at 01/14/20 0940  . lactated ringers 10 mL/hr at 01/18/20 1327     LOS: 35 days        Alex Goodnow Annett Gula, MD

## 2020-01-18 NOTE — Progress Notes (Signed)
    [facility]   Reason for visit: Follow up on TV endocarditis with septic pulmonary emboli  Interval History: no acute events.  WBC wnl, afebrile.  No rash, no diarrhea.     Physical Exam: Constitutional:  Vitals:   01/18/20 0549 01/18/20 1450  BP: 94/64 107/68  Pulse: 71 78  Resp: 18 16  Temp: 97.8 F (36.6 C) 97.9 F (36.6 C)  SpO2: 97% 100%   patient appears in NAD Respiratory: Normal respiratory effort; CTA B Cardiovascular: RRR  Review of Systems: Constitutional: negative for fevers and chills Gastrointestinal: negative for nausea and diarrhea  Lab Results  Component Value Date   WBC 11.6 (H) 01/17/2020   HGB 9.6 (L) 01/17/2020   HCT 32.3 (L) 01/17/2020   MCV 86.8 01/17/2020   PLT 224 01/17/2020    Lab Results  Component Value Date   CREATININE 1.14 01/17/2020   BUN 28 (H) 01/17/2020   NA 136 01/17/2020   K 4.5 01/17/2020   CL 106 01/17/2020   CO2 24 01/17/2020    Lab Results  Component Value Date   ALT 41 01/17/2020   AST 31 01/17/2020   ALKPHOS 189 (H) 01/17/2020     Microbiology: No results found for this or any previous visit (from the past 240 hour(s)).  Impression/Plan:  1. TV endocarditis - he has completed 5 weeks and 1 day of antibiotics IV, over 4 weeks from 1st negative blood culture.   Will transition to oral linezolid tomorrow and will provide prior to discharge for 2 weeks  2.  Hepatitis C - will initiate treatment as an outpatient.    3.  Substance abuse - he understands the need to remain drug free.    He has follow up arranged for 12/1 at 4:15 with me

## 2020-01-19 ENCOUNTER — Other Ambulatory Visit (HOSPITAL_COMMUNITY): Payer: Self-pay | Admitting: Internal Medicine

## 2020-01-19 MED ORDER — TRAMADOL HCL 50 MG PO TABS: 50.0000 mg | ORAL_TABLET | Freq: Four times a day (QID) | ORAL | Status: DC | PRN

## 2020-01-19 MED ORDER — TRAMADOL HCL 50 MG PO TABS: 50.0000 mg | ORAL_TABLET | Freq: Four times a day (QID) | ORAL | 0 refills | Status: DC | PRN

## 2020-01-19 MED ORDER — SPIRONOLACTONE 25 MG PO TABS: 12.5000 mg | ORAL_TABLET | Freq: Every day | ORAL | 0 refills | Status: DC

## 2020-01-19 MED ORDER — ACETAMINOPHEN 325 MG PO TABS: 650.0000 mg | ORAL_TABLET | ORAL | Status: DC | PRN

## 2020-01-19 MED FILL — SPIRONOLACTONE 25 MG TABLET: 25 | 30 days supply | Qty: 15 | Fill #0

## 2020-01-19 MED FILL — traMADol HCL 50 MG TABS: 50 | 3 days supply | Qty: 10 | Fill #0

## 2020-01-19 NOTE — Progress Notes (Addendum)
ID Pharmacy Note   Mr. Garlow discharge medications including linezolid, spirinolactone and tramadol have been delivered to his bedside. He was counseled on how to take the medications and common side effects by Lucina Mellow, 4th year pharmacy student.    Sharin Mons, PharmD, BCPS, BCIDP Infectious Diseases Clinical Pharmacist Phone: [phone] 01/19/2020 10:08 AM

## 2020-01-19 NOTE — Discharge Summary (Signed)
Physician Discharge Summary  Alex [name] FAO:[id number] Alex [name]-1987 DOA: 12/14/2019  PCP: Patient, No Pcp Per  Admit date: 12/14/2019 Discharge date: 01/19/2020  Admitted From: Home  Disposition:  Home   Recommendations for Outpatient Follow-up and new medication changes:  1. Follow up with ID clinic on 02/10/20 at 4:15 pm 2. Continue antibiotic therapy with oral linezolid for 2 weeks.   Home Health: no   Equipment/Devices: no    Discharge Condition: stable  CODE STATUS: full  Diet recommendation: ragular.   Brief/Interim Summary: Alex [name] was admitted to the hospital with a working diagnosis of disseminated MRSA infection, tricuspid valve endocarditis, multiple septic pulmonary emboli and paraspinal muscular myositis. Prolonged hospital stay for inpatient IV antibiotic therapy, unsafe outpatient IV therapies due to IV drug abuse.   34 year old male with past medical history of alcohol abuse, polysubstance abuse, tricuspid valve endocarditis, depression and tobacco use who presented to the hospital with worsening fatigue over the several days. He was recently hospitalized at another hospital where he was diagnosed with MRSA bacteremia and pulmonary embolism related to endocarditis. He left the hospital against medical adviceabout 2 days prior of his hospitalization at [facility]. He mentioned been recently incarcerated for several months where he did use intravenous drugs. On his initial physical examination blood pressure 107/70, heart rate 87, temperature 98, respiratory rate 18, oxygen saturation 97%. He was ill looking appearing, heart S1-S2, present, tachycardic, positive systolic murmur, positive JVD, abdomen soft, positive lower extremity edema.  Patient was started on antibiotic therapy with vancomycin.  Further work-up with echocardiography 12/14/19 showed preserved LV systolic functionwith60 to 65% ejection fraction, right ventricle with decreased systolic function,  severelyenlarge cavity, small pericardial effusion. Tricuspid valve withlarge mobile vegetation on the septal and lateral leaflet with leaflet destruction failure to coapt leading to wide open tricuspid regurgitation.  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 He was treated with intravenous furosemide with good toleration.  He did require 1 unit packed red blood cells for symptomatic anemia.  His antibiotic regimen was changed to daptomycin due to rash on 10/25.  Patient was evaluated by cardiothoracic surgery, he was found not candidate for tricuspid valve repair, or Angiomax.  Follow up thoracic and lumbar spine MRI with no sings of local infection.  Plan to complete antibiotic therapy with oral Linezolid for 2 more weeks.   1.  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  Patient had prolonged hospitalization for inpatient IV antibiotic therapy. He received initially vancomycin and then changed to daptomycin due to rash.  His last positive blood culture was December 16, 2019. He has no signs of further satellite infections, follow-up spine MRI negative for deep infection. He will be discharged on oral antibiotic therapy with linezolid for 2 more weeks, follow-up with infectious disease clinic as an outpatient.  2.  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  Significant valve injury due to infective endocarditis.  Patient did not required diuresis for heart failure decompensation. Currently not candidate for surgical intervention.  He has remained stable with low-dose of spironolactone. Plan to follow-up as an outpatient with primary care, if he remains drug-free it will be reasonable to be reassessed by CT surgery.  3.  Anemia, acute on chronic, combined chronic disease and iron deficiency.   Patient received iron supplementation during his hospitalization.  He required 1 unit of packed  red blood cells. His discharge hemoglobin is 9.6, hematocrit 32.3.  4.  Hepatitis C. patient will start therapy as an outpatient. He had transitory elevation of liver enzymes that has resolved.   5.  Moderate calorie protein malnutrition.  Continue nutritional supplements.  6.  IV drug abuse.  No signs of acute withdrawal, patient did receive opiates along with benzodiazepines as needed during his hospitalization for pain control and anxiety control.  7.  Leukocytoclastic vasculitis.  Patient received short course of prednisone with significant improvement of symptoms.  Lower extremity rash has resolved.  8.  GERD.  Patient received famotidine while hospitalized.  9. Chronic pain syndrome. Pain is well controlled, patient will have as needed tramadol at discharge.   Discharge Diagnoses:  Principal Problem:   Endocarditis Active Problems:   Polysubstance dependence (HCC)   IVDU (intravenous drug user)   Acute CHF (congestive heart failure) (HCC)   Anemia   Acute septic pulmonary embolism (HCC)   Goals of care, counseling/discussion   Palliative care encounter   Chronic hepatitis C without hepatic coma (HCC)   Infective myositis   Pleural effusion, bilateral   Malnutrition of moderate degree   Multifocal pneumonia   MRSA bacteremia   Leukocytoclastic vasculitis (HCC)    Discharge Instructions   Allergies as of 01/19/2020      Reactions   Vancomycin Rash   Patient on vancomycin for 3 weeks, developed rash on his lower extremities. Did not resemble red man syndrome. Required change of abx      Medication List    STOP taking these medications   cloNIDine 0.1 MG tablet Commonly known as: CATAPRES     TAKE these medications   acetaminophen 325 MG tablet Commonly known as: TYLENOL Take 2 tablets (650 mg total) by mouth every 4 (four) hours as needed for headache or mild  pain.   albuterol 108 (90 Base) MCG/ACT inhaler Commonly known as: Proventil HFA Inhale 1-2 puffs into the lungs every 4 (four) hours as needed for wheezing or shortness of breath. What changed: Another medication with the same name was removed. Continue taking this medication, and follow the directions you see here.   linezolid 600 MG tablet Commonly known as: ZYVOX Take 1 tablet (600 mg total) by mouth 2 (two) times daily for 14 days.   spironolactone 25 MG tablet Commonly known as: ALDACTONE Take 0.5 tablets (12.5 mg total) by mouth daily.   traMADol 50 MG tablet Commonly known as: ULTRAM Take 1 tablet (50 mg total) by mouth every 6 (six) hours as needed for severe pain.       Allergies  Allergen Reactions  . Vancomycin Rash    Patient on vancomycin for 3 weeks, developed rash on his lower extremities. Did not resemble red man syndrome. Required change of abx    Consultations:  ID  Cardiology   Palliative Care    Procedures/Studies: DG Chest 2 View  Result Date: 01/05/2020 CLINICAL DATA:  Septic emboli EXAM: CHEST - 2 VIEW COMPARISON:  12/22/2019, 12/18/2019 FINDINGS: The heart size and mediastinal contours are within normal limits. Numerous nodular and patchy airspace opacities scattered throughout both lungs compatible with known multifocal septic emboli, slightly improved from prior. Interval resolution of the previously seen bilateral pleural effusions. No pneumothorax. The visualized skeletal structures are unremarkable. IMPRESSION: 1. Numerous nodular and patchy airspace opacities scattered throughout both lungs compatible with known multifocal septic emboli, with slight interval improved from prior. 2. Interval resolution of previously seen bilateral pleural effusions.  Electronically Signed   By: Duanne Guess D.O.   On: 01/05/2020 08:42   CT CHEST WO CONTRAST  Result Date: 12/22/2019 CLINICAL DATA:  Pneumonia, evaluate pleural effusion, septic emboli EXAM:  CT CHEST WITHOUT CONTRAST TECHNIQUE: Multidetector CT imaging of the chest was performed following the standard protocol without IV contrast. COMPARISON:  None. FINDINGS: Cardiovascular: No significant vascular findings. Normal heart size. No pericardial effusion. Mediastinum/Nodes: Prominent mediastinal lymph nodes. Thyroid gland, trachea, and esophagus demonstrate no significant findings. Lungs/Pleura: There are numerous bilateral pulmonary nodules throughout the lungs, the largest in the right pulmonary upper lobe measuring 2.4 x 2.1 cm (series 7, image 34). There are several larger nodules demonstrating subtle cavitation (e.g. series 7, image 42). Small bilateral pleural effusions and associated atelectasis or consolidation. Upper Abdomen: No acute abnormality. Musculoskeletal: No chest wall mass or suspicious bone lesions identified. IMPRESSION: 1. There are numerous bilateral pulmonary nodules throughout the lungs, the largest in the right pulmonary upper lobe measuring 2.4 x 2.1 cm. There are several larger nodules demonstrating subtle cavitation. Findings are consistent with septic emboli. 2. Small bilateral pleural effusions and associated atelectasis or consolidation. 3. Prominent mediastinal lymph nodes, likely reactive. Electronically Signed   By: Lauralyn Primes M.D.   On: 12/22/2019 13:18   MR THORACIC SPINE W WO CONTRAST  Result Date: 01/14/2020 CLINICAL DATA:  Epidural abscess. History of IV drug abuse. Recent MRSA bacteremia and endocarditis. EXAM: MRI THORACIC AND LUMBAR SPINE WITHOUT AND WITH CONTRAST TECHNIQUE: Multiplanar and multiecho pulse sequences of the thoracic and lumbar spine were obtained without and with intravenous contrast. CONTRAST:  5.50mL GADAVIST GADOBUTROL 1 MMOL/ML IV SOLN COMPARISON:  None. FINDINGS: MRI THORACIC SPINE FINDINGS Alignment:  Normal Vertebrae: Normal bone marrow. Negative for fracture or mass. Negative for spinal infection. Cord: Normal spinal cord signal. No  abnormal enhancement of the cord. No cord compression. No epidural abscess identified. Paraspinal and other soft tissues: Negative for paraspinous mass. No epidural abscess or fluid collection Patchy bilateral lung infiltrates in lung nodules compatible with infection. See chest CT report 12/22/2019 Disc levels: Normal disc spaces.  Negative for disc degeneration or infection. MRI LUMBAR SPINE FINDINGS Segmentation:  Normal Alignment:  Normal Vertebrae: Normal bone marrow. Negative for fracture, mass, or infection. Conus medullaris: Extends to the L1-2 level and appears normal. Paraspinal and other soft tissues: Negative for paraspinous mass or fluid collection. Negative for abscess. Disc levels: Small central and left-sided disc protrusion L5-S1. Mild flattening left S1 nerve root. Remaining disc spaces normal. IMPRESSION: No evidence of spinal infection in the thoracic or lumbar spine. No epidural abscess or diskitis identified. Small central and left-sided disc protrusion L5-S1. Electronically Signed   By: Marlan Palau M.D.   On: 01/14/2020 15:10   MR Lumbar Spine W Wo Contrast  Result Date: 01/14/2020 CLINICAL DATA:  Epidural abscess. History of IV drug abuse. Recent MRSA bacteremia and endocarditis. EXAM: MRI THORACIC AND LUMBAR SPINE WITHOUT AND WITH CONTRAST TECHNIQUE: Multiplanar and multiecho pulse sequences of the thoracic and lumbar spine were obtained without and with intravenous contrast. CONTRAST:  5.88mL GADAVIST GADOBUTROL 1 MMOL/ML IV SOLN COMPARISON:  None. FINDINGS: MRI THORACIC SPINE FINDINGS Alignment:  Normal Vertebrae: Normal bone marrow. Negative for fracture or mass. Negative for spinal infection. Cord: Normal spinal cord signal. No abnormal enhancement of the cord. No cord compression. No epidural abscess identified. Paraspinal and other soft tissues: Negative for paraspinous mass. No epidural abscess or fluid collection Patchy bilateral lung infiltrates in lung nodules compatible  with  infection. See chest CT report 12/22/2019 Disc levels: Normal disc spaces.  Negative for disc degeneration or infection. MRI LUMBAR SPINE FINDINGS Segmentation:  Normal Alignment:  Normal Vertebrae: Normal bone marrow. Negative for fracture, mass, or infection. Conus medullaris: Extends to the L1-2 level and appears normal. Paraspinal and other soft tissues: Negative for paraspinous mass or fluid collection. Negative for abscess. Disc levels: Small central and left-sided disc protrusion L5-S1. Mild flattening left S1 nerve root. Remaining disc spaces normal. IMPRESSION: No evidence of spinal infection in the thoracic or lumbar spine. No epidural abscess or diskitis identified. Small central and left-sided disc protrusion L5-S1. Electronically Signed   By: Marlan Palau M.D.   On: 01/14/2020 15:10   US SCROTUM W/DOPPLER  Result Date: 12/20/2019 CLINICAL DATA:  Hydrocele. EXAM: SCROTAL ULTRASOUND DOPPLER ULTRASOUND OF THE TESTICLES TECHNIQUE: Complete ultrasound examination of the testicles, epididymis, and other scrotal structures was performed. Color and spectral Doppler ultrasound were also utilized to evaluate blood flow to the testicles. COMPARISON:  None. FINDINGS: Right testicle Measurements: 4.1 x 2.8 x 2.7 cm. No mass or microlithiasis visualized. Left testicle Measurements: 3.6 x 2.6 x 2.5 cm. No mass or microlithiasis visualized. Right epididymis:  Normal in size and appearance. Left epididymis:  Normal in size and appearance. Hydrocele:  None visualized. Varicocele:  None visualized. Pulsed Doppler interrogation of both testes demonstrates normal low resistance arterial and venous waveforms bilaterally. There is scrotal wall thickening with increased vascularity. IMPRESSION: 1. No evidence for testicular mass.  No evidence for portion. 2. Nonspecific diffuse scrotal wall thickening and hypervascularity. This can be seen in patients with a soft tissue infection. Correlation with physical exam is  recommended. Electronically Signed   By: Katherine Mantle M.D.   On: 12/20/2019 20:49   [place] EKG SITE RITE  Result Date: 01/01/2020 If Site Rite image not attached, placement could not be confirmed due to current cardiac rhythm.      Subjective: Patient is feeling well, no nausea or vomiting, no dyspnea or chest pain. His back pain is well controlled.   Discharge Exam: Vitals:   01/18/20 2102 01/19/20 0529  BP: 107/66 103/64  Pulse: 77 67  Resp: 17 15  Temp: 98.7 F (37.1 C) 98.1 F (36.7 C)  SpO2: 98% 96%   Vitals:   01/18/20 0549 01/18/20 1450 01/18/20 2102 01/19/20 0529  BP: 94/64 107/68 107/66 103/64  Pulse: 71 78 77 67  Resp: 18 16 17 15   Temp: 97.8 F (36.6 C) 97.9 F (36.6 C) 98.7 F (37.1 C) 98.1 F (36.7 C)  TempSrc: Oral Oral Oral Oral  SpO2: 97% 100% 98% 96%  Weight: 60.5 kg   62.1 kg  Height:        General: Not in pain or dyspnea.  Neurology: Awake and alert, non focal  E ENT: no pallor, no icterus, oral mucosa moist Cardiovascular: No JVD. S1-S2 present, rhythmic, positive systolic murmur at the right sternal border. No lower extremity edema. Pulmonary: positive breath sounds bilaterally,  Gastrointestinal. Abdomen soft and non tender Skin. No rashes Musculoskeletal: no joint deformities   The results of significant diagnostics from this hospitalization (including imaging, microbiology, ancillary and laboratory) are listed below for reference.     Microbiology: No results found for this or any previous visit (from the past 240 hour(s)).   Labs: BNP (last 3 results) Recent Labs    12/14/19 0810  BNP 526.1*   Basic Metabolic Panel: Recent Labs  Lab 01/17/20 0524  NA  136  K 4.5  CL 106  CO2 24  GLUCOSE 94  BUN 28*  CREATININE 1.14  CALCIUM 8.9   Liver Function Tests: Recent Labs  Lab 01/17/20 0524  AST 31  ALT 41  ALKPHOS 189*  BILITOT 0.5  PROT 7.8  ALBUMIN 2.6*   No results for input(s): LIPASE, AMYLASE in the last  168 hours. No results for input(s): AMMONIA in the last 168 hours. CBC: Recent Labs  Lab 01/17/20 0524  WBC 11.6*  NEUTROABS 6.9  HGB 9.6*  HCT 32.3*  MCV 86.8  PLT 224   Cardiac Enzymes: Recent Labs  Lab 01/18/20 1024  CKTOTAL 7*   BNP: Invalid input(s): POCBNP CBG: No results for input(s): GLUCAP in the last 168 hours. D-Dimer No results for input(s): DDIMER in the last 72 hours. Hgb A1c No results for input(s): HGBA1C in the last 72 hours. Lipid Profile No results for input(s): CHOL, HDL, LDLCALC, TRIG, CHOLHDL, LDLDIRECT in the last 72 hours. Thyroid function studies No results for input(s): TSH, T4TOTAL, T3FREE, THYROIDAB in the last 72 hours.  Invalid input(s): FREET3 Anemia work up No results for input(s): VITAMINB12, FOLATE, FERRITIN, TIBC, IRON, RETICCTPCT in the last 72 hours. Urinalysis    Component Value Date/Time   COLORURINE YELLOW 12/14/2019 1143   APPEARANCEUR CLEAR 12/14/2019 1143   LABSPEC 1.014 12/14/2019 1143   PHURINE 6.0 12/14/2019 1143   GLUCOSEU NEGATIVE 12/14/2019 1143   HGBUR LARGE (A) 12/14/2019 1143   BILIRUBINUR NEGATIVE 12/14/2019 1143   KETONESUR NEGATIVE 12/14/2019 1143   PROTEINUR NEGATIVE 12/14/2019 1143   NITRITE NEGATIVE 12/14/2019 1143   LEUKOCYTESUR TRACE (A) 12/14/2019 1143   Sepsis Labs Invalid input(s): PROCALCITONIN,  WBC,  LACTICIDVEN Microbiology No results found for this or any previous visit (from the past 240 hour(s)).   Time coordinating discharge: 45 minutes  SIGNED:   Coralie Keens, MD  [facility] 01/19/2020, 8:16 AM

## 2020-02-07 ENCOUNTER — Emergency Department (HOSPITAL_COMMUNITY): Payer: Self-pay

## 2020-02-07 ENCOUNTER — Inpatient Hospital Stay (HOSPITAL_COMMUNITY): Payer: Self-pay

## 2020-02-07 ENCOUNTER — Encounter (HOSPITAL_COMMUNITY): Payer: Self-pay | Admitting: Emergency Medicine

## 2020-02-07 ENCOUNTER — Inpatient Hospital Stay (HOSPITAL_COMMUNITY)
Admission: EM | Admit: 2020-02-07 | Discharge: 2020-02-12 | DRG: 288 | Payer: Self-pay | Attending: Internal Medicine | Admitting: Internal Medicine

## 2020-02-07 ENCOUNTER — Other Ambulatory Visit: Payer: Self-pay

## 2020-02-07 DIAGNOSIS — D649 Anemia, unspecified: Secondary | ICD-10-CM | POA: Diagnosis present

## 2020-02-07 DIAGNOSIS — F1920 Other psychoactive substance dependence, uncomplicated: Secondary | ICD-10-CM | POA: Diagnosis present

## 2020-02-07 DIAGNOSIS — I2690 Septic pulmonary embolism without acute cor pulmonale: Secondary | ICD-10-CM | POA: Diagnosis present

## 2020-02-07 DIAGNOSIS — I079 Rheumatic tricuspid valve disease, unspecified: Secondary | ICD-10-CM | POA: Diagnosis present

## 2020-02-07 DIAGNOSIS — I76 Septic arterial embolism: Secondary | ICD-10-CM | POA: Diagnosis present

## 2020-02-07 DIAGNOSIS — I5033 Acute on chronic diastolic (congestive) heart failure: Secondary | ICD-10-CM | POA: Diagnosis present

## 2020-02-07 DIAGNOSIS — Z20822 Contact with and (suspected) exposure to covid-19: Secondary | ICD-10-CM | POA: Diagnosis present

## 2020-02-07 DIAGNOSIS — B9562 Methicillin resistant Staphylococcus aureus infection as the cause of diseases classified elsewhere: Secondary | ICD-10-CM | POA: Diagnosis present

## 2020-02-07 DIAGNOSIS — J45909 Unspecified asthma, uncomplicated: Secondary | ICD-10-CM | POA: Diagnosis present

## 2020-02-07 DIAGNOSIS — I330 Acute and subacute infective endocarditis: Principal | ICD-10-CM | POA: Diagnosis present

## 2020-02-07 DIAGNOSIS — Z881 Allergy status to other antibiotic agents status: Secondary | ICD-10-CM

## 2020-02-07 DIAGNOSIS — R079 Chest pain, unspecified: Secondary | ICD-10-CM

## 2020-02-07 DIAGNOSIS — Z825 Family history of asthma and other chronic lower respiratory diseases: Secondary | ICD-10-CM

## 2020-02-07 DIAGNOSIS — L959 Vasculitis limited to the skin, unspecified: Secondary | ICD-10-CM | POA: Diagnosis present

## 2020-02-07 DIAGNOSIS — I440 Atrioventricular block, first degree: Secondary | ICD-10-CM | POA: Diagnosis present

## 2020-02-07 DIAGNOSIS — J984 Other disorders of lung: Secondary | ICD-10-CM | POA: Diagnosis present

## 2020-02-07 DIAGNOSIS — F1210 Cannabis abuse, uncomplicated: Secondary | ICD-10-CM | POA: Diagnosis present

## 2020-02-07 DIAGNOSIS — E875 Hyperkalemia: Secondary | ICD-10-CM | POA: Diagnosis present

## 2020-02-07 DIAGNOSIS — B9689 Other specified bacterial agents as the cause of diseases classified elsewhere: Secondary | ICD-10-CM | POA: Diagnosis present

## 2020-02-07 DIAGNOSIS — F17210 Nicotine dependence, cigarettes, uncomplicated: Secondary | ICD-10-CM | POA: Diagnosis present

## 2020-02-07 DIAGNOSIS — B1920 Unspecified viral hepatitis C without hepatic coma: Secondary | ICD-10-CM | POA: Diagnosis present

## 2020-02-07 DIAGNOSIS — Z79899 Other long term (current) drug therapy: Secondary | ICD-10-CM

## 2020-02-07 DIAGNOSIS — F1510 Other stimulant abuse, uncomplicated: Secondary | ICD-10-CM | POA: Diagnosis present

## 2020-02-07 DIAGNOSIS — I38 Endocarditis, valve unspecified: Secondary | ICD-10-CM

## 2020-02-07 DIAGNOSIS — R7881 Bacteremia: Secondary | ICD-10-CM | POA: Diagnosis present

## 2020-02-07 DIAGNOSIS — F1110 Opioid abuse, uncomplicated: Secondary | ICD-10-CM | POA: Diagnosis present

## 2020-02-07 DIAGNOSIS — I509 Heart failure, unspecified: Secondary | ICD-10-CM

## 2020-02-07 DIAGNOSIS — I776 Arteritis, unspecified: Secondary | ICD-10-CM

## 2020-02-07 DIAGNOSIS — F1010 Alcohol abuse, uncomplicated: Secondary | ICD-10-CM | POA: Diagnosis present

## 2020-02-07 HISTORY — DX: Heart failure, unspecified: I50.9

## 2020-02-07 LAB — CBC WITH DIFFERENTIAL/PLATELET
Abs Immature Granulocytes: 0.02 10*3/uL (ref 0.00–0.07)
Basophils Absolute: 0 10*3/uL (ref 0.0–0.1)
Basophils Relative: 1 %
Eosinophils Absolute: 0.2 10*3/uL (ref 0.0–0.5)
Eosinophils Relative: 3 %
HCT: 35 % — ABNORMAL LOW (ref 39.0–52.0)
Hemoglobin: 10.4 g/dL — ABNORMAL LOW (ref 13.0–17.0)
Immature Granulocytes: 0 %
Lymphocytes Relative: 23 %
Lymphs Abs: 2 10*3/uL (ref 0.7–4.0)
MCH: 26.5 pg (ref 26.0–34.0)
MCHC: 29.7 g/dL — ABNORMAL LOW (ref 30.0–36.0)
MCV: 89.3 fL (ref 80.0–100.0)
Monocytes Absolute: 0.4 10*3/uL (ref 0.1–1.0)
Monocytes Relative: 4 %
Neutro Abs: 6 10*3/uL (ref 1.7–7.7)
Neutrophils Relative %: 69 %
Platelets: 212 10*3/uL (ref 150–400)
RBC: 3.92 MIL/uL — ABNORMAL LOW (ref 4.22–5.81)
RDW: 19.4 % — ABNORMAL HIGH (ref 11.5–15.5)
WBC: 8.6 10*3/uL (ref 4.0–10.5)
nRBC: 0 % (ref 0.0–0.2)

## 2020-02-07 LAB — URINALYSIS, ROUTINE W REFLEX MICROSCOPIC
Bacteria, UA: NONE SEEN
Bilirubin Urine: NEGATIVE
Glucose, UA: NEGATIVE mg/dL
Ketones, ur: NEGATIVE mg/dL
Leukocytes,Ua: NEGATIVE
Nitrite: NEGATIVE
Protein, ur: 30 mg/dL — AB
Specific Gravity, Urine: 1.015 (ref 1.005–1.030)
pH: 6 (ref 5.0–8.0)

## 2020-02-07 LAB — COMPREHENSIVE METABOLIC PANEL
ALT: 51 U/L — ABNORMAL HIGH (ref 0–44)
AST: 59 U/L — ABNORMAL HIGH (ref 15–41)
Albumin: 3.2 g/dL — ABNORMAL LOW (ref 3.5–5.0)
Alkaline Phosphatase: 218 U/L — ABNORMAL HIGH (ref 38–126)
Anion gap: 10 (ref 5–15)
BUN: 10 mg/dL (ref 6–20)
CO2: 24 mmol/L (ref 22–32)
Calcium: 8.6 mg/dL — ABNORMAL LOW (ref 8.9–10.3)
Chloride: 105 mmol/L (ref 98–111)
Creatinine, Ser: 0.99 mg/dL (ref 0.61–1.24)
GFR, Estimated: >60 mL/min (ref >60)
Glucose, Bld: 78 mg/dL (ref 70–99)
Potassium: 4.4 mmol/L (ref 3.5–5.1)
Sodium: 139 mmol/L (ref 135–145)
Total Bilirubin: 0.8 mg/dL (ref 0.3–1.2)
Total Protein: 7.5 g/dL (ref 6.5–8.1)

## 2020-02-07 LAB — ECHOCARDIOGRAM COMPLETE
Area-P 1/2: 2.76 cm2
Height: 71 in
S' Lateral: 3.1 cm
Weight: 2176 oz

## 2020-02-07 LAB — PROTIME-INR
INR: 1.4 — ABNORMAL HIGH (ref 0.8–1.2)
Prothrombin Time: 16.4 seconds — ABNORMAL HIGH (ref 11.4–15.2)

## 2020-02-07 LAB — RAPID URINE DRUG SCREEN, HOSP PERFORMED
Amphetamines: POSITIVE — AB
Barbiturates: NOT DETECTED
Benzodiazepines: NOT DETECTED
Cocaine: NOT DETECTED
Opiates: NOT DETECTED
Tetrahydrocannabinol: POSITIVE — AB

## 2020-02-07 LAB — APTT: aPTT: 38 seconds — ABNORMAL HIGH (ref 24–36)

## 2020-02-07 LAB — TROPONIN I (HIGH SENSITIVITY): Troponin I (High Sensitivity): <2 ng/L (ref <18)

## 2020-02-07 LAB — RESP PANEL BY RT-PCR (FLU A&B, COVID) ARPGX2
Influenza A by PCR: NEGATIVE
Influenza B by PCR: NEGATIVE
SARS Coronavirus 2 by RT PCR: NEGATIVE

## 2020-02-07 LAB — LACTIC ACID, PLASMA
Lactic Acid, Venous: 2 mmol/L (ref 0.5–1.9)
Lactic Acid, Venous: 1.5 mmol/L (ref 0.5–1.9)

## 2020-02-07 MED ORDER — ONDANSETRON HCL 4 MG PO TABS: 4.0000 mg | ORAL_TABLET | Freq: Four times a day (QID) | ORAL | Status: DC | PRN

## 2020-02-07 MED ORDER — SPIRONOLACTONE 12.5 MG HALF TABLET
12.5000 mg | ORAL_TABLET | Freq: Every day | ORAL | Status: DC
  Administered 2020-02-07 – 2020-02-09 (×3): 12.5 mg via ORAL
  Filled 2020-02-07 (×5): qty 1

## 2020-02-07 MED ORDER — ONDANSETRON HCL 4 MG/2ML IJ SOLN: 4.0000 mg | Freq: Four times a day (QID) | INTRAMUSCULAR | Status: DC | PRN

## 2020-02-07 MED ORDER — SODIUM CHLORIDE 0.9% FLUSH
3.0000 mL | Freq: Two times a day (BID) | INTRAVENOUS | Status: DC
  Administered 2020-02-09 – 2020-02-12 (×5): 3 mL via INTRAVENOUS

## 2020-02-07 MED ORDER — ACETAMINOPHEN 650 MG RE SUPP: 650.0000 mg | Freq: Four times a day (QID) | RECTAL | Status: DC | PRN

## 2020-02-07 MED ORDER — SODIUM CHLORIDE 0.9 % IV SOLN
2.0000 g | INTRAVENOUS | Status: AC
  Administered 2020-02-07: 2 g via INTRAVENOUS
  Filled 2020-02-07: qty 2

## 2020-02-07 MED ORDER — TRAMADOL HCL 50 MG PO TABS
50.0000 mg | ORAL_TABLET | Freq: Four times a day (QID) | ORAL | Status: DC | PRN
  Administered 2020-02-07 – 2020-02-12 (×13): 50 mg via ORAL
  Filled 2020-02-07 (×14): qty 1

## 2020-02-07 MED ORDER — LACTATED RINGERS IV BOLUS (SEPSIS)
1000.0000 mL | Freq: Once | INTRAVENOUS | Status: AC
  Administered 2020-02-07: 1000 mL via INTRAVENOUS

## 2020-02-07 MED ORDER — LIDOCAINE 5 % EX PTCH
1.0000 | MEDICATED_PATCH | CUTANEOUS | Status: AC
  Administered 2020-02-07 – 2020-02-08 (×2): 1 via TRANSDERMAL
  Filled 2020-02-07 (×3): qty 1

## 2020-02-07 MED ORDER — SODIUM CHLORIDE (PF) 0.9 % IJ SOLN
INTRAMUSCULAR | Status: AC
  Filled 2020-02-07: qty 50

## 2020-02-07 MED ORDER — VANCOMYCIN HCL 1500 MG/300ML IV SOLN
1500.0000 mg | Freq: Once | INTRAVENOUS | Status: AC
  Administered 2020-02-07: 1500 mg via INTRAVENOUS
  Filled 2020-02-07: qty 300

## 2020-02-07 MED ORDER — VANCOMYCIN HCL IN DEXTROSE 1-5 GM/200ML-% IV SOLN
1000.0000 mg | Freq: Two times a day (BID) | INTRAVENOUS | Status: DC
  Administered 2020-02-08 – 2020-02-10 (×5): 1000 mg via INTRAVENOUS
  Filled 2020-02-07 (×5): qty 200

## 2020-02-07 MED ORDER — LACTATED RINGERS IV SOLN: INTRAVENOUS | Status: DC

## 2020-02-07 MED ORDER — HEPARIN SODIUM (PORCINE) 5000 UNIT/ML IJ SOLN
5000.0000 [IU] | Freq: Three times a day (TID) | INTRAMUSCULAR | Status: DC
  Administered 2020-02-07 – 2020-02-09 (×6): 5000 [IU] via SUBCUTANEOUS
  Filled 2020-02-07 (×6): qty 1

## 2020-02-07 MED ORDER — SODIUM CHLORIDE 0.9 % IV SOLN
500.0000 mg | Freq: Once | INTRAVENOUS | Status: DC
  Filled 2020-02-07 (×2): qty 10

## 2020-02-07 MED ORDER — ACETAMINOPHEN 500 MG PO TABS
1000.0000 mg | ORAL_TABLET | Freq: Once | ORAL | Status: AC
  Administered 2020-02-07: 1000 mg via ORAL
  Filled 2020-02-07: qty 2

## 2020-02-07 MED ORDER — KETOROLAC TROMETHAMINE 15 MG/ML IJ SOLN
15.0000 mg | Freq: Four times a day (QID) | INTRAMUSCULAR | Status: AC | PRN
  Administered 2020-02-07 – 2020-02-12 (×16): 15 mg via INTRAVENOUS
  Filled 2020-02-07 (×16): qty 1

## 2020-02-07 MED ORDER — IOHEXOL 350 MG/ML SOLN
100.0000 mL | Freq: Once | INTRAVENOUS | Status: AC | PRN
  Administered 2020-02-07: 100 mL via INTRAVENOUS

## 2020-02-07 MED ORDER — ACETAMINOPHEN 325 MG PO TABS
650.0000 mg | ORAL_TABLET | Freq: Four times a day (QID) | ORAL | Status: DC | PRN
  Administered 2020-02-07 – 2020-02-11 (×5): 650 mg via ORAL
  Filled 2020-02-07 (×5): qty 2

## 2020-02-07 MED ORDER — SODIUM CHLORIDE 0.9 % IV SOLN
500.0000 mg | Freq: Once | INTRAVENOUS | Status: AC
  Administered 2020-02-07: 500 mg via INTRAVENOUS
  Filled 2020-02-07: qty 10

## 2020-02-07 NOTE — H&P (Signed)
History and Physical        Hospital Admission Note Date: 02/07/2020  Patient name: Alex [name] Medical record number: [id number] Date of birth: 1985/04/26 Age: 34 y.o. Gender: male  PCP: Patient, No Pcp Per   Chief Complaint    Chief Complaint  Patient presents with  . Medical Clearance  . Chest Pain      HPI:   This is a 34 year old male with past medical history of IV drug abuse, alcohol abuse, tricuspid valve MRSA endocarditis, HCV, recurrent hospitalizations for endocarditis who presented to the ED in police custody for concerns of chest pain and bilateral lower extremity rash which has been going on for several days.  Patient recently had a hospitalization from 10/4-11/9 for TV endocarditis with pulmonary septic emboli paraspinal myositis.  He was initially treated with vancomycin and then switched to daptomycin due to a rash and discharged on linezolid for 2 weeks.  States that he finished his antibiotics a few days ago and then had his presenting symptoms soon after.  Denies any recent IV drug use but has been using amphetamines and marijuana in the past several days.  He was seen by CT surgery during his last hospitalization and was not deemed a candidate for TV repair or Angiomax.  ED Course: Afebrile, hemodynamically stable, on room air.  O2 occasionally briefly dropped to 80s while I was in the room and resolved spontaneously.  Notable Labs: Alk phos 218, AST 59, ALT 51, lactic acid 2.0-> 1.5, WBC 8.6, UA with Hb. Notable Imaging: CXR: Bilateral predominant scarring with multiple upper lobe predominant cystic lucencies likely representing postinflammatory/infectious pneumatoceles.  CTA chest: No acute PE, consistent with multifocal septic emboli most of the previously noted nodules demonstrate interval improvement however there is a new cavitary lung nodule in the posterior  right lower lobe measuring 1.9 cm concerning for recurrent septic emboli, enlarged RA and RV with reflux of contrast into the IVC and hepatic veins consistent with passive venous congestion due to right heart failure. Patient received daptomycin, cefepime, LR.    Vitals:   02/07/20 1130 02/07/20 1321  BP: 123/90 (!) 131/96  Pulse: 94 75  Resp: 14 16  Temp:  97.7 F (36.5 C)  SpO2: 97% 99%     Review of Systems:  Review of Systems  Skin:       Painful bilateral lower extremity rash  All other systems reviewed and are negative.   Medical/Social/Family History   Past Medical History: Past Medical History:  Diagnosis Date  . ADHD (attention deficit hyperactivity disorder)   . Allergy   . Asthma   . CHF (congestive heart failure) ([place])   . Depressed   . 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 ETOH abuse   . History of suicidal tendencies   . Polysubstance abuse [facility])     Past Surgical History:  Procedure Laterality Date  . RADIOLOGY WITH ANESTHESIA N/A 12/18/2019   Procedure: MRI-THORACIC MRI WITH AND WITHOUT CONSTRAST;  Surgeon: Radiologist, Medication, MD;  Location: [place];  Service: Radiology;  Laterality: N/A;  . RADIOLOGY WITH ANESTHESIA N/A 01/14/2020   Procedure: MRI WITH ANESTHESIA OF THORACIC SPINE AND LUMBAR SPINE;  Surgeon: Radiologist, Medication, MD;  Location: [place];  Service: Radiology;  Laterality: N/A;  . TEE WITHOUT CARDIOVERSION N/A 12/31/2018   Procedure: TRANSESOPHAGEAL ECHOCARDIOGRAM (TEE);  Surgeon: Sanda Klein, MD;  Location: [facility] ENDOSCOPY;  Service: Cardiovascular;  Laterality: N/A;    Medications: Prior to Admission medications   Medication Sig Start Date End Date Taking? Authorizing Provider  acetaminophen (TYLENOL) 325 MG tablet Take 2 tablets (650 mg total) by mouth every 4 (four) hours as needed for headache or mild pain. 01/19/20  Yes Arrien, Jimmy Picket, MD  albuterol (PROVENTIL HFA) [phone] Base) MCG/ACT inhaler Inhale 1-2 puffs  into the lungs every 4 (four) hours as needed for wheezing or shortness of breath. 09/08/18  Yes Wieters, Hallie C, PA-C  spironolactone (ALDACTONE) 25 MG tablet Take 0.5 tablets (12.5 mg total) by mouth daily. 01/19/20 02/18/20 Yes Arrien, Jimmy Picket, MD  traMADol (ULTRAM) 50 MG tablet Take 1 tablet (50 mg total) by mouth every 6 (six) hours as needed for severe pain. 01/19/20  Yes Arrien, Jimmy Picket, MD  ipratropium-albuterol (DUONEB) 0.5-2.5 (3) MG/3ML SOLN Take 3 mLs by nebulization every 6 (six) hours as needed (wheezing). Patient not taking: Reported on 12/28/2018 04/14/18 12/28/18  Charlann Lange, PA-C  montelukast (SINGULAIR) 10 MG tablet Take 1 tablet (10 mg total) by mouth at bedtime. Patient not taking: Reported on 12/28/2018 07/25/17 12/28/18  Zigmund Gottron, NP  traZODone (DESYREL) 50 MG tablet Take 1 tablet (50 mg total) by mouth at bedtime and may repeat dose one time if needed. Patient not taking: Reported on 07/05/2018 04/29/16 12/28/18  Derrill Center, NP    Allergies:   Allergies  Allergen Reactions  . Vancomycin Rash    Patient on vancomycin for 3 weeks, developed rash on his lower extremities. Did not resemble red man syndrome. Required change of abx    Social History:  reports that he has been smoking cigarettes. He has a 10.00 pack-year smoking history. He has never used smokeless tobacco. He reports current alcohol use of about 12.0 standard drinks of alcohol per week. He reports current drug use. Frequency: 6.00 times per week. Drugs: Marijuana, Cocaine, Heroin, Fentanyl, and IV.  Family History: Family History  Problem Relation Age of Onset  . COPD Other      Objective   Physical Exam: Blood pressure (!) 131/96, pulse 75, temperature 97.7 F (36.5 C), resp. rate 16, height 5' 11"  (1.803 m), weight 61.7 kg, SpO2 99 %.  Physical Exam Vitals and nursing note reviewed.  Constitutional:      General: He is not in acute distress. HENT:     Head:  Normocephalic.  Cardiovascular:     Rate and Rhythm: Normal rate and regular rhythm.     Heart sounds: Normal heart sounds. No murmur heard.   Pulmonary:     Effort: Pulmonary effort is normal. No respiratory distress.     Breath sounds: Normal breath sounds.  Abdominal:     Palpations: Abdomen is soft.  Musculoskeletal:     Right lower leg: No edema.     Left lower leg: No edema.  Skin:    Coloration: Skin is pale.     Findings: Rash present.     Comments: No lesions on his palms or soles  Neurological:     General: No focal deficit present.     Mental Status: He is alert.  Psychiatric:        Mood and Affect: Mood normal. Mood is not anxious.  LABS on Admission: I have personally reviewed all the labs and imaging below    Basic Metabolic Panel: Recent Labs  Lab 02/07/20 0623  NA 139  K 4.4  CL 105  CO2 24  GLUCOSE 78  BUN 10  CREATININE 0.99  CALCIUM 8.6*   Liver Function Tests: Recent Labs  Lab 02/07/20 0623  AST 59*  ALT 51*  ALKPHOS 218*  BILITOT 0.8  PROT 7.5  ALBUMIN 3.2*   No results for input(s): LIPASE, AMYLASE in the last 168 hours. No results for input(s): AMMONIA in the last 168 hours. CBC: Recent Labs  Lab 02/07/20 0623  WBC 8.6  NEUTROABS 6.0  HGB 10.4*  HCT 35.0*  MCV 89.3  PLT 212   Cardiac Enzymes: No results for input(s): CKTOTAL, CKMB, CKMBINDEX, TROPONINI in the last 168 hours. BNP: Invalid input(s): POCBNP CBG: No results for input(s): GLUCAP in the last 168 hours.  Radiological Exams on Admission:  CT Angio Chest PE W and/or Wo Contrast  Result Date: 02/07/2020 CLINICAL DATA:  Evaluate for pulmonary embolus. EXAM: CT ANGIOGRAPHY CHEST WITH CONTRAST TECHNIQUE: Multidetector CT imaging of the chest was performed using the standard protocol during bolus administration of intravenous contrast. Multiplanar CT image reconstructions and MIPs were obtained to evaluate the vascular anatomy. CONTRAST:  176m OMNIPAQUE  IOHEXOL 350 MG/ML SOLN COMPARISON:  CT chest 12/22/2019 FINDINGS: Cardiovascular: Satisfactory opacification of the pulmonary arteries to the segmental level. No evidence of pulmonary embolism. Normal heart size. No pericardial effusion. The right atrium and right ventricle appear increased in caliber and there is reflux of contrast material into the IVC and hepatic veins consistent with passive venous congestion due to right heart failure. Mediastinum/Nodes: No enlarged mediastinal, hilar, or axillary lymph nodes. Thyroid gland, trachea, and esophagus demonstrate no significant findings. Lungs/Pleura: No pleural effusion identified. Multifocal bilateral peripheral predominant areas of nodularity, scarring, and architectural distortion identified consistent with sequelae of previous septic emboli. Most of the previously noted nodules demonstrate interval improvement from previous exam: Previous solid subpleural nodule within the lateral right upper lobe now appears cavitary and decreased in size measuring 2.3 x 1.3 cm, image 29/7. Previously this measured 2.4 x 2.1 cm. Nodule within the posterior left upper lobe measures 0.8 x 1.0 cm, image 40/7. Previously 1.6 x 1.4 cm. Previous solid nodule in the superior segment of left lower lobe measuring 1.8 x 1.1 cm, image 90/7 of the previous exam, has resolved in the interval. New cavitary lung nodule within the posterior right lower lobe measures 1.9 cm, image 90/7. Upper Abdomen: No acute abnormality. Musculoskeletal: No chest wall abnormality. No acute or significant osseous findings. Review of the MIP images confirms the above findings. IMPRESSION: 1. No evidence for acute pulmonary embolus. 2. Multifocal bilateral peripheral predominant areas of nodularity, scarring, and architectural distortion consistent with sequelae of previous septic emboli. Most of the previously noted nodules demonstrate interval improvement from previous exam. However, there is a new cavitary  lung nodule within the posterior right lower lobe measuring 1.9 cm. 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 3. Enlarged right atrium and right ventricle with reflux of contrast material into the IVC and hepatic veins consistent with passive venous congestion due to right heart failure. Electronically Signed   By: TKerby MoorsM.D.   On: 02/07/2020 09:57   DG Chest Port 1 View  Result Date: 02/07/2020 CLINICAL DATA:  Questionable sepsis.  Evaluate for abnormality. EXAM: PORTABLE CHEST 1 VIEW COMPARISON:  01/05/2020 FINDINGS: The heart  size appears within normal limits. No pleural effusion identified. Bilateral peripheral predominant scarring with multiple upper lobe predominant cystic lucencies likely representing postinflammatory/infectious pneumatoceles. IMPRESSION: Bilateral peripheral predominant scarring with multiple upper lobe predominant cystic lucencies likely representing postinflammatory/infectious pneumatoceles. Electronically Signed   By: Kerby Moors M.D.   On: 02/07/2020 07:13      EKG: Right axis deviation, first-degree AV block otherwise sinus   A & P   Principal Problem:   Endocarditis of tricuspid valve Active Problems:   Polysubstance dependence ([place])   MRSA bacteremia   Septic embolism ([place])   1. Concern for recurrent/persistent endocarditis with new septic emboli a. History of MRSA TV endocarditis with multiple hospitalizations in the past year b. Recently completed antibiotic therapy c. Chest pain likely secondary to emboli d. Was not deemed a candidate for surgery at the time.  Discussed with Dr. Orvan Seen, CT surgery, who did not feel the patient would likely be a candidate at this time but will reach out to the previous surgeon e. Echo f. ID consulted, discussed with Dr. Juleen China, recommended daptomycin g. Blood cultures h. Might be a candidate for palliative care discussions i. Heart failure protocol  2. Concern for developing sepsis  secondary to above a. Low-grade fever and lactic acidosis with infectious etiology b. Plan as above  3. Polysubstance abuse a. Strongly encouraged cessation  4. Concern for developing right sided heart failure secondary to vegetations with history of the same a. Continue home spironolactone b. Heart failure protocol    DVT prophylaxis: Heparin, monitor for hemoptysis   Code Status: Full Code  Diet: Low-sodium Family Communication: Admission, patients condition and plan of care including tests being ordered have been discussed with the patient who indicates understanding and agrees with the plan and Code Status.  Disposition Plan: The appropriate patient status for this patient is INPATIENT. Inpatient status is judged to be reasonable and necessary in order to provide the required intensity of service to ensure the patient's safety. The patient's presenting symptoms, physical exam findings, and initial radiographic and laboratory data in the context of their chronic comorbidities is felt to place them at high risk for further clinical deterioration. Furthermore, it is not anticipated that the patient will be medically stable for discharge from the hospital within 2 midnights of admission. The following factors support the patient status of inpatient.   " The patient's presenting symptoms include rash, chest pain. " The worrisome physical exam findings include lower extremity rash. " The initial radiographic and laboratory data are worrisome because of new septic emboli. " The chronic co-morbidities include drug abuse, TV endocarditis.   * I certify that at the point of admission it is my clinical judgment that the patient will require inpatient hospital care spanning beyond 2 midnights from the point of admission due to high intensity of service, high risk for further deterioration and high frequency of surveillance required.*   Status is: Inpatient  Remains inpatient appropriate  because:IV treatments appropriate due to intensity of illness or inability to take PO and Inpatient level of care appropriate due to severity of illness   Dispo: The patient is from: Home              Anticipated d/c is to: TBD              Anticipated d/c date is: > 3 days              Patient currently is not medically stable to d/c.  The medical decision making on this patient was of high complexity and the patient is at high risk for clinical deterioration, therefore this is a level 3 admission.  Consultants  . ID . Discussed with CT surgery  Procedures  . none  Time Spent on Admission: 70 minutes    Harold Hedge, DO [facility]  02/07/2020, 1:59 PM

## 2020-02-07 NOTE — ED Notes (Signed)
ED TO INPATIENT HANDOFF REPORT  ED Nurse Name and Phone #: [phone]  S Name/Age/Gender Alex [name] 34 y.o. male Room/Bed: WA15/WA15  Code Status   Code Status: Prior  Home/SNF/Other Home Patient oriented to: self, place, time and situation Is this baseline? Yes   Triage Complete: Triage complete  Chief Complaint Septic embolism [facility](HCC) [[id number]]  Triage Note Patient presents in police custody for medical clearance for jail. Patient states he was recently admitted for approx. 2 months and was recently discharged. Patient is complaining of chest pain and "kidney" pain, and leg swelling. Patient states, "I think they took me off the antibiotics too soon." Patient denies IVDU, states last use was before his admission.    Allergies Allergies  Allergen Reactions  . Vancomycin Rash    Patient on vancomycin for 3 weeks, developed rash on his lower extremities. Did not resemble red man syndrome. Required change of abx    Level of Care/Admitting Diagnosis ED Disposition    ED Disposition Condition Comment   Admit  [facility] [[id number]]  Level of Care: Progressive [102]  Admit to Progressive based on following criteria: MULTISYSTEM THREATS such as stable sepsis, metabolic/electrolyte imbalance with or without encephalopathy that is responding to early treatment.  [facility] to [facility] or [facility] if equivalent level of care is available:: Yes  Covid Evaluation: Asymptomatic Screening Protocol (No Symptoms)  Diagnosis: Septic embolism [facility](HCC) [[id number]][[id number]]  Admitting Physician: Jae DireSEGAL, JARED E [[id number]]  Attending Physician: Jae DireSEGAL, JARED E [[id number]]  Estimated length of stay: past midnight tomorrow  Certification:: I certify this patient will need inpatient services for at least 2 midnights       B Medical/Surgery History Past Medical History:  Diagnosis Date  . ADHD (attention deficit hyperactivity disorder)   . Allergy   . Asthma   .  CHF (congestive heart failure) (HCC)   . Depressed   . 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 ETOH abuse   . History of suicidal tendencies   . Polysubstance abuse [facility](HCC)    Past Surgical History:  Procedure Laterality Date  . RADIOLOGY WITH ANESTHESIA N/A 12/18/2019   Procedure: MRI-THORACIC MRI WITH AND WITHOUT CONSTRAST;  Surgeon: Radiologist, Medication, MD;  Location: [facility] OR;  Service: Radiology;  Laterality: N/A;  . RADIOLOGY WITH ANESTHESIA N/A 01/14/2020   Procedure: MRI WITH ANESTHESIA OF THORACIC SPINE AND LUMBAR SPINE;  Surgeon: Radiologist, Medication, MD;  Location: [facility] OR;  Service: Radiology;  Laterality: N/A;  . TEE WITHOUT CARDIOVERSION N/A 12/31/2018   Procedure: TRANSESOPHAGEAL ECHOCARDIOGRAM (TEE);  Surgeon: Thurmon Fairroitoru, Mihai, MD;  [facility];  Service: Cardiovascular;  Laterality: N/A;     A IV Location/Drains/Wounds Patient Lines/Drains/Airways Status    Active Line/Drains/Airways    Name Placement date Placement time Site Days   Peripheral IV 02/07/20 Right Forearm 02/07/20  0622  Forearm  less than 1   Peripheral IV 02/07/20 Left;Medial Forearm 02/07/20  0820  Forearm  less than 1          Intake/Output Last 24 hours  Intake/Output Summary (Last 24 hours) at 02/07/2020 1157 Last data filed at 02/07/2020 1154 Gross per 24 hour  Intake 2000 ml  Output --  Net 2000 ml    Labs/Imaging Results for orders placed or performed during the hospital encounter of 02/07/20 (from the past 48 hour(s))  Lactic acid, plasma     Status: Abnormal   Collection Time: 02/07/20  6:23 AM  Result Value Ref  Range   Lactic Acid, Venous 2.0 (HH) 0.5 - 1.9 mmol/L    Comment: CRITICAL RESULT CALLED TO, READ BACK BY AND VERIFIED WITH: ZULETA,[name] RN 02/07/2020 @0733  BY P.HENDERSON Performed at [facility], [address], [place], [place]   Lactic acid, plasma     Status: None   Collection Time: 02/07/20  6:23 AM  Result Value Ref  Range   Lactic Acid, Venous 1.5 0.5 - 1.9 mmol/L    Comment: Performed at [facility], [address], [place], [place]  Comprehensive metabolic panel     Status: Abnormal   Collection Time: 02/07/20  6:23 AM  Result Value Ref Range   Sodium 139 135 - 145 mmol/L   Potassium 4.4 3.5 - 5.1 mmol/L   Chloride 105 98 - 111 mmol/L   CO2 24 22 - 32 mmol/L   Glucose, Bld 78 70 - 99 mg/dL    Comment: Glucose reference range applies only to samples taken after fasting for at least 8 hours.   BUN 10 6 - 20 mg/dL   Creatinine, Ser 02/09/20 0.61 - 1.24 mg/dL   Calcium 8.6 (L) 8.9 - 10.3 mg/dL   Total Protein 7.5 6.5 - 8.1 g/dL   Albumin 3.2 (L) 3.5 - 5.0 g/dL   AST 59 (H) 15 - 41 U/L   ALT 51 (H) 0 - 44 U/L   Alkaline Phosphatase 218 (H) 38 - 126 U/L   Total Bilirubin 0.8 0.3 - 1.2 mg/dL   GFR, Estimated 6.95 >07 mL/min    Comment: (NOTE) Calculated using the CKD-EPI Creatinine Equation (2021)    Anion gap 10 5 - 15    Comment: Performed at [facility], [address], [place], [place]  CBC WITH DIFFERENTIAL     Status: Abnormal   Collection Time: 02/07/20  6:23 AM  Result Value Ref Range   WBC 8.6 4.0 - 10.5 K/uL   RBC 3.92 (L) 4.22 - 5.81 MIL/uL   Hemoglobin 10.4 (L) 13.0 - 17.0 g/dL   HCT 02/09/20 (L) 39 - 52 %   MCV 89.3 80.0 - 100.0 fL   MCH 26.5 26.0 - 34.0 pg   MCHC 29.7 (L) 30.0 - 36.0 g/dL   RDW 18.3 (H) 35.8 - 25.1 %   Platelets 212 150 - 400 K/uL   nRBC 0.0 0.0 - 0.2 %   Neutrophils Relative % 69 %   Neutro Abs 6.0 1.7 - 7.7 K/uL   Lymphocytes Relative 23 %   Lymphs Abs 2.0 0.7 - 4.0 K/uL   Monocytes Relative 4 %   Monocytes Absolute 0.4 0.1 - 1.0 K/uL   Eosinophils Relative 3 %   Eosinophils Absolute 0.2 0.0 - 0.5 K/uL   Basophils Relative 1 %   Basophils Absolute 0.0 0.0 - 0.1 K/uL   Immature Granulocytes 0 %   Abs Immature Granulocytes 0.02 0.00 - 0.07 K/uL    Comment: Performed at [facility],  [address] [place], [place]  Urinalysis, Routine w reflex microscopic Urine, Clean Catch     Status: Abnormal   Collection Time: 02/07/20  6:23 AM  Result Value Ref Range   Color, Urine YELLOW YELLOW   APPearance CLEAR CLEAR   Specific Gravity, Urine 1.015 1.005 - 1.030   pH 6.0 5.0 - 8.0   Glucose, UA NEGATIVE NEGATIVE mg/dL   Hgb urine dipstick LARGE (A) NEGATIVE   Bilirubin Urine NEGATIVE NEGATIVE  Ketones, ur NEGATIVE NEGATIVE mg/dL   Protein, ur 30 (A) NEGATIVE mg/dL   Nitrite NEGATIVE NEGATIVE   Leukocytes,Ua NEGATIVE NEGATIVE   RBC / HPF 21-50 0 - 5 RBC/hpf   WBC, UA 0-5 0 - 5 WBC/hpf   Bacteria, UA NONE SEEN NONE SEEN   Mucus PRESENT     Comment: Performed at [facility], [address] [place], [place] [address]  Rapid urine drug screen (hospital performed)     Status: Abnormal   Collection Time: 02/07/20  6:23 AM  Result Value Ref Range   Opiates NONE DETECTED NONE DETECTED   Cocaine NONE DETECTED NONE DETECTED   Benzodiazepines NONE DETECTED NONE DETECTED   Amphetamines POSITIVE (A) NONE DETECTED   Tetrahydrocannabinol POSITIVE (A) NONE DETECTED   Barbiturates NONE DETECTED NONE DETECTED    Comment: (NOTE) DRUG SCREEN FOR MEDICAL PURPOSES ONLY.  IF CONFIRMATION IS NEEDED FOR ANY PURPOSE, NOTIFY LAB WITHIN 5 DAYS.  LOWEST DETECTABLE LIMITS FOR URINE DRUG SCREEN Drug Class                     Cutoff (ng/mL) Amphetamine and metabolites    1000 Barbiturate and metabolites    200 Benzodiazepine                 200 Tricyclics and metabolites     300 Opiates and metabolites        300 Cocaine and metabolites        300 THC                            50 Performed at [facility], [address], [place], [place] [address]   Resp Panel by RT-PCR (Flu A&B, Covid) Nasopharyngeal Swab     Status: None   Collection Time: 02/07/20  6:23 AM   Specimen: Nasopharyngeal Swab; Nasopharyngeal(NP) swabs in vial transport  medium  Result Value Ref Range   SARS Coronavirus 2 by RT PCR NEGATIVE NEGATIVE    Comment: (NOTE) SARS-CoV-2 target nucleic acids are NOT DETECTED.  The SARS-CoV-2 RNA is generally detectable in upper respiratory specimens during the acute phase of infection. The lowest concentration of SARS-CoV-2 viral copies this assay can detect is 138 copies/mL. A negative result does not preclude SARS-Cov-2 infection and should not be used as the sole basis for treatment or other patient management decisions. A negative result may occur with  improper specimen collection/handling, submission of specimen other than nasopharyngeal swab, presence of viral mutation(s) within the areas targeted by this assay, and inadequate number of viral copies(<138 copies/mL). A negative result must be combined with clinical observations, patient history, and epidemiological information. The expected result is Negative.  Fact Sheet for Patients:  BloggerCourse.com  Fact Sheet for Healthcare Providers:  SeriousBroker.it  This test is no t yet approved or cleared by the [place] FDA and  has been authorized for detection and/or diagnosis of SARS-CoV-2 by FDA under an Emergency Use Authorization (EUA). This EUA will remain  in effect (meaning this test can be used) for the duration of the COVID-19 declaration under Section 564(b)(1) of the Act, 21 U.S.C.section 360bbb-3(b)(1), unless the authorization is terminated  or revoked sooner.       Influenza A by PCR NEGATIVE NEGATIVE   Influenza B by PCR NEGATIVE NEGATIVE    Comment: (NOTE) The Xpert Xpress SARS-CoV-2/FLU/RSV plus assay is intended as an aid in the diagnosis of  influenza from Nasopharyngeal swab specimens and should not be used as a sole basis for treatment. Nasal washings and aspirates are unacceptable for Xpert Xpress SARS-CoV-2/FLU/RSV testing.  Fact Sheet for  Patients: BloggerCourse.com  Fact Sheet for Healthcare Providers: SeriousBroker.it  This test is not yet approved or cleared by the [place] FDA and has been authorized for detection and/or diagnosis of SARS-CoV-2 by FDA under an Emergency Use Authorization (EUA). This EUA will remain in effect (meaning this test can be used) for the duration of the COVID-19 declaration under Section 564(b)(1) of the Act, 21 U.S.C. section 360bbb-3(b)(1), unless the authorization is terminated or revoked.  Performed at [facility], [address], [place], [place] [address]   Troponin I (High Sensitivity)     Status: None   Collection Time: 02/07/20  6:23 AM  Result Value Ref Range   Troponin I (High Sensitivity) <2 <18 ng/L    Comment: (NOTE) Elevated high sensitivity troponin I (hsTnI) values and significant  changes across serial measurements may suggest ACS but many other  chronic and acute conditions are known to elevate hsTnI results.  Refer to the "Links" section for chest pain algorithms and additional  guidance. Performed at [facility], [address] [place], [place] [address]   Protime-INR     Status: Abnormal   Collection Time: 02/07/20  7:11 AM  Result Value Ref Range   Prothrombin Time 16.4 (H) 11.4 - 15.2 seconds   INR 1.4 (H) 0.8 - 1.2    Comment: (NOTE) INR goal varies based on device and disease states. Performed at [facility], [address] [place], [place] [address]   APTT     Status: Abnormal   Collection Time: 02/07/20  7:11 AM  Result Value Ref Range   aPTT 38 (H) 24 - 36 seconds    Comment:        IF BASELINE aPTT IS ELEVATED, SUGGEST PATIENT RISK ASSESSMENT BE USED TO DETERMINE APPROPRIATE ANTICOAGULANT THERAPY. Performed at [facility], [address], [place], [place] [address]    CT Angio Chest PE W and/or Wo  Contrast  Result Date: 02/07/2020 CLINICAL DATA:  Evaluate for pulmonary embolus. EXAM: CT ANGIOGRAPHY CHEST WITH CONTRAST TECHNIQUE: Multidetector CT imaging of the chest was performed using the standard protocol during bolus administration of intravenous contrast. Multiplanar CT image reconstructions and MIPs were obtained to evaluate the vascular anatomy. CONTRAST:  OMNIPAQUE IOHEXOL 350 MG/ML SOLN COMPARISON:  CT chest 12/22/2019 FINDINGS: Cardiovascular: Satisfactory opacification of the pulmonary arteries to the segmental level. No evidence of pulmonary embolism. Normal heart size. No pericardial effusion. The right atrium and right ventricle appear increased in caliber and there is reflux of contrast material into the IVC and hepatic veins consistent with passive venous congestion due to right heart failure. Mediastinum/Nodes: No enlarged mediastinal, hilar, or axillary lymph nodes. Thyroid gland, trachea, and esophagus demonstrate no significant findings. Lungs/Pleura: No pleural effusion identified. Multifocal bilateral peripheral predominant areas of nodularity, scarring, and architectural distortion identified consistent with sequelae of previous septic emboli. Most of the previously noted nodules demonstrate interval improvement from previous exam: Previous solid subpleural nodule within the lateral right upper lobe now appears cavitary and decreased in size measuring 2.3 x 1.3 cm, image 29/7. Previously this measured 2.4 x 2.1 cm. Nodule within the posterior left upper lobe measures 0.8 x 1.0 cm, image 40/7. Previously 1.6 x 1.4 cm. Previous solid nodule in the superior segment of  left lower lobe measuring 1.8 x 1.1 cm, image 90/7 of the previous exam, has resolved in the interval. New cavitary lung nodule within the posterior right lower lobe measures 1.9 cm, image 90/7. Upper Abdomen: No acute abnormality. Musculoskeletal: No chest wall abnormality. No acute or significant osseous findings.  Review of the MIP images confirms the above findings. IMPRESSION: 1. No evidence for acute pulmonary embolus. 2. Multifocal bilateral peripheral predominant areas of nodularity, scarring, and architectural distortion consistent with sequelae of previous septic emboli. Most of the previously noted nodules demonstrate interval improvement from previous exam. However, there is a new cavitary lung nodule within the posterior right lower lobe measuring 1.9 cm. 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 3. Enlarged right atrium and right ventricle with reflux of contrast material into the IVC and hepatic veins consistent with passive venous congestion due to right heart failure. Electronically Signed   By: Signa Kell M.D.   On: 02/07/2020 09:57   DG Chest Port 1 View  Result Date: 02/07/2020 CLINICAL DATA:  Questionable sepsis.  Evaluate for abnormality. EXAM: PORTABLE CHEST 1 VIEW COMPARISON:  01/05/2020 FINDINGS: The heart size appears within normal limits. No pleural effusion identified. Bilateral peripheral predominant scarring with multiple upper lobe predominant cystic lucencies likely representing postinflammatory/infectious pneumatoceles. IMPRESSION: Bilateral peripheral predominant scarring with multiple upper lobe predominant cystic lucencies likely representing postinflammatory/infectious pneumatoceles. Electronically Signed   By: Signa Kell M.D.   On: 02/07/2020 07:13    Pending Labs Unresulted Labs (From admission, onward)          Start     Ordered   02/07/20 0557  Blood Culture (routine x 2)  (Septic presentation on arrival (screening labs, nursing and treatment orders for obvious sepsis))  BLOOD CULTURE X 2,   STAT      02/07/20 0559   02/07/20 0557  Urine culture  (Septic presentation on arrival (screening labs, nursing and treatment orders for obvious sepsis))  ONCE - STAT,   STAT        02/07/20 0559          Vitals/Pain Today's Vitals   02/07/20 0830  02/07/20 0900 02/07/20 0930 02/07/20 1130  BP: 120/86 (!) 109/91 (!) 134/104 123/90  Pulse: 82 84 73 94  Resp:  11 (!) 7 14  Temp:      TempSrc:      SpO2: 98% 97% 100% 97%  PainSc:        Isolation Precautions No active isolations  Medications Medications  lactated ringers infusion ( Intravenous New Bag/Given 02/07/20 0635)  sodium chloride (PF) 0.9 % injection (has no administration in time range)  lactated ringers bolus 1,000 mL (0 mLs Intravenous Stopped 02/07/20 1153)    And  lactated ringers bolus 1,000 mL (0 mLs Intravenous Stopped 02/07/20 1154)  acetaminophen (TYLENOL) tablet 1,000 mg (1,000 mg Oral Given 02/07/20 0634)  ceFEPIme (MAXIPIME) 2 g in sodium chloride 0.9 % 100 mL IVPB (0 g Intravenous Stopped 02/07/20 0722)  DAPTOmycin (CUBICIN) 500 mg in sodium chloride 0.9 % IVPB (0 mg Intravenous Stopped 02/07/20 0913)  iohexol (OMNIPAQUE) 350 MG/ML injection 100 mL (100 mLs Intravenous Contrast Given 02/07/20 5277)    Mobility walks Low fall risk   Focused Assessments .   R Recommendations: See Admitting Provider Note  Report given to:   Additional Notes: n/a

## 2020-02-07 NOTE — ED Provider Notes (Signed)
7:17 AM Care assumed from Dr. Elesa Massed.  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  He reports that after completing his outpatient linezolid, several days later he developed a rash and symptoms similar to prior endocarditis with chest discomfort.  Patient did have a near fever here with a rectal temp of 100.1.  He was started on broad-spectrum antibiotics and is getting CTA to look for new septic emboli as well as other labs.  Plan of care is to admit after work-up.  CT scan returned showing evidence of new septic emboli which likely indicates recurrent endocarditis.  Medicine team called and will admit for further management.  Patient is already sued antibiotics.  Patient agrees with admission.      Alex [name], Canary Brim, MD 02/07/20 1150

## 2020-02-07 NOTE — Progress Notes (Signed)
Officer noted to be in attendance of patient, states that shift change will likely occur at 2230.

## 2020-02-07 NOTE — Progress Notes (Signed)
Pharmacy Antibiotic Note  Alex [name] is a 34 y.o. male with a h/o IVDU and endocarditis with recent extended hospitalization for the same. During the last admission, he was treated with daptomycin and discharged with linezolid. Patient is re-admitted on 02/07/2020 with chest pain and rash. CT chest revealed new septic emboli which may be consistent with recurrent endocarditis per EDP. Patient received daptomycin x 1 this AM. Patient has a vancomycin allergy listed from previous admission related to a LE rash that persists to this hospitalization. Seems less likely to be a drug reaction than part of the disease process especially since it persists. Plan is to retrial with vancomycin which would cover if cavitary lesion on CT is PNA and reveal if vancomycin is true allergy which may be valuable information in the future.   Plan: Will load vancomycin 1500 mg iv once followed by 1000 mg iv q 12 hours. Will target trough 15-20 mcg/ml. Regimen results in predicted AUC 559 and predicted Css min of 15 using actual weight.   Will follow renal function closely with daily SCr and check a vanc level at steady-state depending on plans for antibiotics   Instructed RN to monitor for rash or signs of allergic reaction.  Will update allergy information if patient tolerates loading dose.       Temp (24hrs), Avg:99.2 F (37.3 C), Min:98.2 F (36.8 C), Max:100.1 F (37.8 C)  Recent Labs  Lab 02/07/20 0623  WBC 8.6  CREATININE 0.99  LATICACIDVEN 1.5  2.0*    CrCl cannot be calculated (Unknown ideal weight.).    Allergies  Allergen Reactions  . Vancomycin Rash    Patient on vancomycin for 3 weeks, developed rash on his lower extremities. Did not resemble red man syndrome. Required change of abx    Antimicrobials this admission: 11/28 daptomycin x 1 11/28 vancomyin >>  Dose adjustments this admission:  Microbiology results: 11/28 BCx:  11/28 UCx:   11/28 Resp PCR: neg/neg  Thank you for  allowing pharmacy to be a part of this patient's care.  Luisa Hart D 02/07/2020 12:59 PM

## 2020-02-07 NOTE — ED Provider Notes (Signed)
TIME SEEN: 5:59 AM  CHIEF COMPLAINT: Chest pain, rash  HPI: Patient is a 34 year old male with history of IV drug abuse (heroin and methamphetamine), alcohol abuse, endocarditis of the tricuspid valve, hepatitis C who presents to the emergency department in police custody for concerns for chest pain.  Police report he is under arrest for multiple warrants.  He began complaining of chest pain and they brought him here for medical clearance.  He states he has had several days of chest pain and return of rash to his lower extremities.  He has had chills.  He is not sure if he has had fever.  Reports nausea, vomiting but no diarrhea.  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  He was hospitalized for inpatient IV antibiotic therapy.  He was on daptomycin and then discharged home on linezolid for 2 weeks which he reports he finished but states within 3 days of finishing antibiotics his chest pain and rash were back.  He states he has not used any IV drugs since being discharged.  On review of records, it appears patient was seen by cardiothoracic surgery and was not deemed a candidate for tricuspid valve repair or Angiomax.  ROS: See HPI Constitutional: no fever  Eyes: no drainage  ENT: no runny nose   Cardiovascular:   chest pain  Resp: no SOB  GI:  vomiting GU: no dysuria Integumentary:  rash  Allergy: no hives  Musculoskeletal: no leg swelling  Neurological: no slurred speech ROS otherwise negative  PAST MEDICAL HISTORY/PAST SURGICAL HISTORY:  Past Medical History:  Diagnosis Date  . ADHD (attention deficit hyperactivity disorder)   . Allergy   . Asthma   . Depressed   . 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 ETOH abuse   . History of suicidal tendencies   . Polysubstance abuse (HCC)     MEDICATIONS:  Prior to Admission medications    Medication Sig Start Date End Date Taking? Authorizing Provider  acetaminophen (TYLENOL) 325 MG tablet Take 2 tablets (650 mg total) by mouth every 4 (four) hours as needed for headache or mild pain. 01/19/20   Arrien, York Ram, MD  albuterol (PROVENTIL HFA) 108 [phone] Base) MCG/ACT inhaler Inhale 1-2 puffs into the lungs every 4 (four) hours as needed for wheezing or shortness of breath. 09/08/18   Wieters, Hallie C, PA-C  spironolactone (ALDACTONE) 25 MG tablet Take 0.5 tablets (12.5 mg total) by mouth daily. 01/19/20 02/18/20  Arrien, York Ram, MD  traMADol (ULTRAM) 50 MG tablet Take 1 tablet (50 mg total) by mouth every 6 (six) hours as needed for severe pain. 01/19/20   Arrien, York Ram, MD  ipratropium-albuterol (DUONEB) 0.5-2.5 (3) MG/3ML SOLN Take 3 mLs by nebulization every 6 (six) hours as needed (wheezing). Patient not taking: Reported on 12/28/2018 04/14/18 12/28/18  Elpidio Anis, PA-C  montelukast (SINGULAIR) 10 MG tablet Take 1 tablet (10 mg total) by mouth at bedtime. Patient not taking: Reported on 12/28/2018 07/25/17 12/28/18  Georgetta Haber, NP  traZODone (DESYREL) 50 MG tablet Take 1 tablet (50 mg total) by mouth at bedtime and may repeat dose one time if needed. Patient not taking: Reported on 07/05/2018 04/29/16 12/28/18  Oneta Rack, NP    ALLERGIES:  Allergies  Allergen Reactions  . Vancomycin Rash    Patient on vancomycin for 3 weeks, developed rash on his  lower extremities. Did not resemble red man syndrome. Required change of abx    SOCIAL HISTORY:  Social History   Tobacco Use  . Smoking status: Current Every Day Smoker    Packs/day: 1.00    Years: 10.00    Pack years: 10.00    Types: Cigarettes  . Smokeless tobacco: Never Used  Substance Use Topics  . Alcohol use: Yes    Alcohol/week: 12.0 standard drinks    Types: 12 Cans of beer per week    Comment: 12 beers daily    FAMILY HISTORY: Family History  Problem Relation Age of Onset   . COPD Other     EXAM: BP 120/90 (BP Location: Left Arm)   Pulse 81   Temp 100.1 F (37.8 C) (Rectal)   SpO2 100%  CONSTITUTIONAL: Alert and oriented and responds appropriately to questions.  Chronically ill-appearing, appears older than stated age, appears drowsy HEAD: Normocephalic EYES: Conjunctivae clear, pupils appear equal, EOM appear intact ENT: normal nose; moist mucous membranes NECK: Supple, normal ROM CARD: RRR; S1 and S2 appreciated; + systolic murmur appreciated, no clicks, no rubs, no gallops RESP: Normal chest excursion without splinting or tachypnea; breath sounds clear and equal bilaterally; no wheezes, no rhonchi, no rales, no hypoxia or respiratory distress, speaking full sentences ABD/GI: Normal bowel sounds; non-distended; soft, non-tender, no rebound, no guarding, no peritoneal signs, no hepatosplenomegaly BACK:  The back appears normal EXT: Normal ROM in all joints; no deformity noted, no edema; no cyanosis SKIN: Normal color for age and race; warm; scattered maculopapular erythematous rash noted to his bilateral extremities without petechiae or purpura NEURO: Moves all extremities equally PSYCH: The patient's mood and manner are appropriate.   MEDICAL DECISION MAKING: Patient here with low-grade rectal temperature of 100.1, chest pain, bilateral lower extremity rash, positive murmur on exam.  History of previous endocarditis.  He states he has not used any IV drugs since being discharged at the beginning of November.  Will check urine drug screen today.  He states that his symptoms came back 3 days after finishing his oral linezolid.  Will obtain labs, chest x-ray, cultures.  Will give IV daptomycin and cefepime for broad coverage.  Will give 30 mL/kg IV fluid bolus.  Given his history of septic pulmonary emboli with complaints of chest pain, will also obtain CTA of his chest.  ED PROGRESS: Signed out to Dr. Rush Landmark.  Labs and imaging pending.  Will need  admission.  I reviewed all nursing notes and pertinent previous records as available.  I have reviewed and interpreted any EKGs, lab and urine results, imaging (as available).    EKG Interpretation  Date/Time:  Sunday February 07 2020 05:39:51 EST Ventricular Rate:  87 PR Interval:    QRS Duration: 92 QT Interval:  361 QTC Calculation: 435 R Axis:   122 Text Interpretation: Sinus rhythm Prolonged PR interval Right axis deviation Baseline wander in lead(s) III No significant change since last tracing Confirmed by Elliana Bal, Baxter Hire [id number]) on 02/07/2020 6:01:08 AM       CRITICAL CARE Performed by: Baxter Hire Priti Consoli   Total critical care time: 55 minutes  Critical care time was exclusive of separately billable procedures and treating other patients.  Critical care was necessary to treat or prevent imminent or life-threatening deterioration.  Critical care was time spent personally by me on the following activities: development of treatment plan with patient and/or surrogate as well as nursing, discussions with consultants, evaluation of patient's response to treatment, examination of  patient, obtaining history from patient or surrogate, ordering and performing treatments and interventions, ordering and review of laboratory studies, ordering and review of radiographic studies, pulse oximetry and re-evaluation of patient's condition.   DANIELE YANKOWSKI was evaluated in Emergency Department on 02/07/2020 for the symptoms described in the history of present illness. He was evaluated in the context of the global COVID-19 pandemic, which necessitated consideration that the patient might be at risk for infection with the SARS-CoV-2 virus that causes COVID-19. Institutional protocols and algorithms that pertain to the evaluation of patients at risk for COVID-19 are in a state of rapid change based on information released by regulatory bodies including the CDC and federal and state organizations. These  policies and algorithms were followed during the patient's care in the ED.      Quina Wilbourne, Layla Maw, DO 02/07/20 [phone]

## 2020-02-07 NOTE — ED Notes (Signed)
Pt refused rectal temperature EDP Ward notified

## 2020-02-07 NOTE — Consult Note (Signed)
[facility]    Date of Admission:  02/07/2020     Current antibiotics: Day 1 daptomycin  Reason for Consult: MRSA endocarditis     Referring Physician: Dr Dairl Ponder  ASSESSMENT:    # MRSA bacteremia/Tricuspid valve endocarditis # New pulmonary cavitary lesion # Substance use disorder # Hepatitis C  Patient recently completed several weeks of IV antibiotics followed by 2 weeks of linezolid at discharge on 01/19/20, however, it sounds like he was inconsistent in taking it as prescribed.  Nonetheless, he is here again with low-grade fevers, recurrence of his lower extremity rash, and new pulmonary septic emboli in the setting of known tricuspid valve endocarditis secondary to MRSA bacteremia from injection drug use.  I think his vancomycin allergy is equivocal as this may have been more so related to his infection and not medication related especially since it has now recurred several weeks after being off vancomycin.  PLAN:    -- I favor a retrial of vancomycin and will order with pharmacy to dose -- I think this will be helpful to know if he has a true allergy and will enable [place] to use monotherapy (I had considered adding linezolid to daptomycin in the setting of pulmonary disease) -- TTE -- Consider punch biopsy of vasculitic rash to confirm diagnosis of leukocytoclastic vasculitis -- I would have a low threshold to re-image his back if pain continues  -- Follow cultures  -- Hep C to be managed as an outpatient   MEDICATIONS:    Scheduled Meds: . heparin  5,000 Units Subcutaneous Q8H  . sodium chloride (PF)      . sodium chloride flush  3 mL Intravenous Q12H  . spironolactone  12.5 mg Oral Daily    Continuous Infusions:   PRN Meds: acetaminophen **OR** acetaminophen, ketorolac, ondansetron **OR** ondansetron (ZOFRAN) IV, traMADol  HPI:    Alex [name] is a 34 y.o. male with history of injection drug use, alcohol use disorder, tricuspid  valve endocarditis, hepatitis C who presented to the emergency department overnight in police custody for concerns of chest pain.  He was in custody for multiple warrants and began complaining of chest pain when they brought him to [facility] long emergency department for medical clearance.  States he has had several days of chest pain and return of rash to his lower extremities associated with chills.  Unclear if he had fever but he denies objective fevers.  Heported nausea, vomiting, but no diarrhea or constipation.  He also complains of back pain.    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  He remained hospitalized for inpatient IV antibiotic therapy.  He was on vancomycin then daptomycin for several weeks and ultimately discharged on linezolid for 2 weeks.  He reports taking this but reports some missed doses and has not completed all his pills yet.  However, about 3 days ago he began developing symptoms c/w prior endocarditis with chest pain, leg swelling, and recurrence of his rash.  He reports not using any injection drugs since being discharged but has been snorting opiates.    During the last hospitalization he was seen by cardiothoracic surgery and was deemed not a candidate for tricuspid valve repair or angio Vac procedure.  Last admission he received about 3 weeks of therapy  with vancomycin prior to the development of a rash that was unclear if it was related to drug rash or in part also due to endocarditis prompting a change from vancomycin to daptomycin at that point.  Today, he has multifocal bilateral peripheral predominant areas of nodularity scarring and architectural distortion consistent with sequela I of previous septic emboli most of the previously noted nodules  demonstrate interval improvement from previous exam.  However, there is a new cavitary nodule within the posterior right lower lobe measuring 1.9 cm suggestive of recurrent septic emboli.  Also seen was an enlarged right atrium and right ventricle with reflux of contrast material into the IVC and hepatic veins consistent with passive venous congestion due to right heart failure.  In the Ed he had a low grade temp of 100.1 and WBC 8.6.    Past Medical History:  Diagnosis Date  . ADHD (attention deficit hyperactivity disorder)   . Allergy   . Asthma   . CHF (congestive heart failure) (HCC)   . Depressed   . 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 ETOH abuse   . History of suicidal tendencies   . Polysubstance abuse (HCC)     Social History   Tobacco Use  . Smoking status: Current Every Day Smoker    Packs/day: 1.00    Years: 10.00    Pack years: 10.00    Types: Cigarettes  . Smokeless tobacco: Never Used  Vaping Use  . Vaping Use: Never used  Substance Use Topics  . Alcohol use: Yes    Alcohol/week: 12.0 standard drinks    Types: 12 Cans of beer per week    Comment: 12 beers daily  . Drug use: Yes    Frequency: 6.0 times per week    Types: Marijuana, Cocaine, Heroin, Fentanyl, IV    Comment: daily use cocaine (snort)    Family History  Problem Relation Age of Onset  . COPD Other     Allergies  Allergen Reactions  . Vancomycin Rash    Patient on vancomycin for 3 weeks, developed rash on his lower extremities. Did not resemble red man syndrome. Required change of abx    Review of Systems  Constitutional: Positive for chills. Negative for fever.  HENT: Negative.   Eyes: Negative.   Respiratory: Negative for cough and shortness of breath.   Cardiovascular: Positive for chest pain and leg swelling.  Gastrointestinal: Positive for nausea and vomiting. Negative for abdominal pain and diarrhea.  Genitourinary: Negative.   Musculoskeletal: Positive for back pain.    Skin: Positive for rash.  Neurological: Negative.   Psychiatric/Behavioral: Negative.     All other systems reviewed and are negative.  OBJECTIVE:   Blood pressure (!) 131/96, pulse 75, temperature 97.7 F (36.5 C), resp. rate 16, height 5\' 11"  (1.803 m), weight 61.7 kg, SpO2 99 %. Body mass index is 18.97 kg/m.  Physical Exam Constitutional:      Comments: Uncomfortable appearing, restless in the bed, not acutely in distress.   HENT:     Head: Normocephalic and atraumatic.     Mouth/Throat:     Mouth: Mucous membranes are moist.     Pharynx: Oropharynx is clear.  Eyes:     Extraocular Movements: Extraocular movements intact.     Conjunctiva/sclera: Conjunctivae normal.  Cardiovascular:     Rate and Rhythm: Normal rate and regular rhythm.     Heart sounds: Murmur heard.   Pulmonary:     Effort: Pulmonary effort is normal.  No respiratory distress.     Breath sounds: Normal breath sounds.  Abdominal:     General: Abdomen is flat.     Palpations: Abdomen is soft.     Tenderness: There is no abdominal tenderness.  Musculoskeletal:     Cervical back: Normal range of motion and neck supple.     Right lower leg: Edema present.     Left lower leg: Edema present.  Skin:    General: Skin is warm and dry.     Findings: Rash present.     Comments: Superficial abrasions noted of upper extremities. Vasculitic appearing rash of bilateral legs.   Neurological:     General: No focal deficit present.     Mental Status: He is oriented to person, place, and time.      Lab Results & Microbiology Lab Results  Component Value Date   WBC 8.6 02/07/2020   HGB 10.4 (L) 02/07/2020   HCT 35.0 (L) 02/07/2020   MCV 89.3 02/07/2020   PLT 212 02/07/2020    Lab Results  Component Value Date   NA 139 02/07/2020   K 4.4 02/07/2020   CO2 24 02/07/2020   GLUCOSE 78 02/07/2020   BUN 10 02/07/2020   CREATININE 0.99 02/07/2020   CALCIUM 8.6 (L) 02/07/2020   GFRNONAA >60 02/07/2020    GFRAA >60 12/15/2019    Lab Results  Component Value Date   ALT 51 (H) 02/07/2020   AST 59 (H) 02/07/2020   ALKPHOS 218 (H) 02/07/2020   BILITOT 0.8 02/07/2020    C-Reactive Protein     Component Value Date/Time   CRP 2.4 (H) 01/17/2020 0524    Erythrocyte Sedimentation Rate     Component Value Date/Time   ESRSEDRATE 7 08/20/2019 1027      I have reviewed the micro and lab results in Epic.  Imaging CT Angio Chest PE W and/or Wo Contrast  Result Date: 02/07/2020 CLINICAL DATA:  Evaluate for pulmonary embolus. EXAM: CT ANGIOGRAPHY CHEST WITH CONTRAST TECHNIQUE: Multidetector CT imaging of the chest was performed using the standard protocol during bolus administration of intravenous contrast. Multiplanar CT image reconstructions and MIPs were obtained to evaluate the vascular anatomy. CONTRAST:  100mL OMNIPAQUE IOHEXOL 350 MG/ML SOLN COMPARISON:  CT chest 12/22/2019 FINDINGS: Cardiovascular: Satisfactory opacification of the pulmonary arteries to the segmental level. No evidence of pulmonary embolism. Normal heart size. No pericardial effusion. The right atrium and right ventricle appear increased in caliber and there is reflux of contrast material into the IVC and hepatic veins consistent with passive venous congestion due to right heart failure. Mediastinum/Nodes: No enlarged mediastinal, hilar, or axillary lymph nodes. Thyroid gland, trachea, and esophagus demonstrate no significant findings. Lungs/Pleura: No pleural effusion identified. Multifocal bilateral peripheral predominant areas of nodularity, scarring, and architectural distortion identified consistent with sequelae of previous septic emboli. Most of the previously noted nodules demonstrate interval improvement from previous exam: Previous solid subpleural nodule within the lateral right upper lobe now appears cavitary and decreased in size measuring 2.3 x 1.3 cm, image 29/7. Previously this measured 2.4 x 2.1 cm. Nodule within  the posterior left upper lobe measures 0.8 x 1.0 cm, image 40/7. Previously 1.6 x 1.4 cm. Previous solid nodule in the superior segment of left lower lobe measuring 1.8 x 1.1 cm, image 90/7 of the previous exam, has resolved in the interval. New cavitary lung nodule within the posterior right lower lobe measures 1.9 cm, image 90/7. Upper Abdomen: No acute abnormality. Musculoskeletal: No chest  wall abnormality. No acute or significant osseous findings. Review of the MIP images confirms the above findings. IMPRESSION: 1. No evidence for acute pulmonary embolus. 2. Multifocal bilateral peripheral predominant areas of nodularity, scarring, and architectural distortion consistent with sequelae of previous septic emboli. Most of the previously noted nodules demonstrate interval improvement from previous exam. However, there is a new cavitary lung nodule within the posterior right lower lobe measuring 1.9 cm. 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 3. Enlarged right atrium and right ventricle with reflux of contrast material into the IVC and hepatic veins consistent with passive venous congestion due to right heart failure. Electronically Signed   By: Signa Kell M.D.   On: 02/07/2020 09:57   DG Chest Port 1 View  Result Date: 02/07/2020 CLINICAL DATA:  Questionable sepsis.  Evaluate for abnormality. EXAM: PORTABLE CHEST 1 VIEW COMPARISON:  01/05/2020 FINDINGS: The heart size appears within normal limits. No pleural effusion identified. Bilateral peripheral predominant scarring with multiple upper lobe predominant cystic lucencies likely representing postinflammatory/infectious pneumatoceles. IMPRESSION: Bilateral peripheral predominant scarring with multiple upper lobe predominant cystic lucencies likely representing postinflammatory/infectious pneumatoceles. Electronically Signed   By: Signa Kell M.D.   On: 02/07/2020 07:13      Vedia Coffer for Infectious  Disease [facility] [phone] pager 02/07/2020, 1:56 PM

## 2020-02-07 NOTE — ED Triage Notes (Addendum)
Patient presents in police custody for medical clearance for jail. Patient states he was recently admitted for approx. 2 months and was recently discharged. Patient is complaining of chest pain and "kidney" pain, and leg swelling. Patient states, "I think they took me off the antibiotics too soon." Patient denies IVDU, states last use was before his admission.

## 2020-02-07 NOTE — Progress Notes (Signed)
A consult was received from an ED physician for Daptomycin and Cefepime per pharmacy dosing.  The patient's profile has been reviewed for ht/wt/allergies/indication/available labs.    A one time order has been placed for Cefepime 2gm and Daptomycin 500mg  IV.    Further antibiotics/pharmacy consults should be ordered by admitting physician if indicated.                       Thank you, , PharmD 02/07/2020  6:16 AM

## 2020-02-07 NOTE — Plan of Care (Signed)
  Problem: Nutrition: Goal: Adequate nutrition will be maintained Outcome: Progressing   Problem: Pain Managment: Goal: General experience of comfort will improve Outcome: Progressing   Problem: Skin Integrity: Goal: Risk for impaired skin integrity will decrease Outcome: Progressing   Problem: Education: Goal: Knowledge of disease or condition will improve Outcome: Progressing Goal: Understanding of discharge needs will improve Outcome: Progressing   Problem: Safety: Goal: Ability to remain free from injury will improve Outcome: Progressing

## 2020-02-08 ENCOUNTER — Inpatient Hospital Stay (HOSPITAL_COMMUNITY): Payer: Self-pay

## 2020-02-08 DIAGNOSIS — R079 Chest pain, unspecified: Secondary | ICD-10-CM

## 2020-02-08 DIAGNOSIS — M549 Dorsalgia, unspecified: Secondary | ICD-10-CM

## 2020-02-08 DIAGNOSIS — I776 Arteritis, unspecified: Secondary | ICD-10-CM

## 2020-02-08 DIAGNOSIS — R21 Rash and other nonspecific skin eruption: Secondary | ICD-10-CM

## 2020-02-08 LAB — CBC
HCT: 30.5 % — ABNORMAL LOW (ref 39.0–52.0)
Hemoglobin: 9.2 g/dL — ABNORMAL LOW (ref 13.0–17.0)
MCH: 26 pg (ref 26.0–34.0)
MCHC: 30.2 g/dL (ref 30.0–36.0)
MCV: 86.2 fL (ref 80.0–100.0)
Platelets: 176 10*3/uL (ref 150–400)
RBC: 3.54 MIL/uL — ABNORMAL LOW (ref 4.22–5.81)
RDW: 19 % — ABNORMAL HIGH (ref 11.5–15.5)
WBC: 7.2 10*3/uL (ref 4.0–10.5)
nRBC: 0 % (ref 0.0–0.2)

## 2020-02-08 LAB — BASIC METABOLIC PANEL
Anion gap: 8 (ref 5–15)
BUN: 19 mg/dL (ref 6–20)
CO2: 23 mmol/L (ref 22–32)
Calcium: 8.2 mg/dL — ABNORMAL LOW (ref 8.9–10.3)
Chloride: 104 mmol/L (ref 98–111)
Creatinine, Ser: 1.02 mg/dL (ref 0.61–1.24)
GFR, Estimated: >60 mL/min (ref >60)
Glucose, Bld: 95 mg/dL (ref 70–99)
Potassium: 4.6 mmol/L (ref 3.5–5.1)
Sodium: 135 mmol/L (ref 135–145)

## 2020-02-08 LAB — URINE CULTURE: Culture: NO GROWTH

## 2020-02-08 LAB — PROTIME-INR
INR: 1.3 — ABNORMAL HIGH (ref 0.8–1.2)
Prothrombin Time: 15.7 seconds — ABNORMAL HIGH (ref 11.4–15.2)

## 2020-02-08 MED ORDER — LIDOCAINE-EPINEPHRINE 2 %-1:100000 IJ SOLN
30.0000 mL | Freq: Once | INTRAMUSCULAR | Status: DC
  Filled 2020-02-08: qty 30

## 2020-02-08 MED ORDER — OXYCODONE HCL 5 MG PO TABS: 5.0000 mg | ORAL_TABLET | ORAL | Status: DC | PRN

## 2020-02-08 MED ORDER — FUROSEMIDE 20 MG PO TABS
20.0000 mg | ORAL_TABLET | Freq: Every day | ORAL | Status: DC
  Administered 2020-02-09 – 2020-02-12 (×3): 20 mg via ORAL
  Filled 2020-02-08 (×3): qty 1

## 2020-02-08 MED ORDER — OXYCODONE HCL 5 MG PO TABS
10.0000 mg | ORAL_TABLET | ORAL | Status: DC | PRN
  Administered 2020-02-08 – 2020-02-12 (×16): 10 mg via ORAL
  Filled 2020-02-08 (×16): qty 2

## 2020-02-08 MED ORDER — PANTOPRAZOLE SODIUM 40 MG PO TBEC
40.0000 mg | DELAYED_RELEASE_TABLET | Freq: Every day | ORAL | Status: DC
  Administered 2020-02-08 – 2020-02-12 (×4): 40 mg via ORAL
  Filled 2020-02-08 (×4): qty 1

## 2020-02-08 NOTE — Progress Notes (Addendum)
Subjective: Low to mid back pain   Antibiotics:  Anti-infectives (From admission, onward)   Start     Dose/Rate Route Frequency Ordered Stop   02/08/20 0500  vancomycin (VANCOCIN) IVPB 1000 mg/200 mL premix        1,000 mg 200 mL/hr over 60 Minutes Intravenous Every 12 hours 02/07/20 1631     02/07/20 1730  vancomycin (VANCOREADY) IVPB 1500 mg/300 mL        1,500 mg 150 mL/hr over 120 Minutes Intravenous  Once 02/07/20 1631 02/07/20 2055   02/07/20 0830  DAPTOmycin (CUBICIN) 500 mg in sodium chloride 0.9 % IVPB        500 mg 220 mL/hr over 30 Minutes Intravenous  Once 02/07/20 0712 02/07/20 0913   02/07/20 0630  DAPTOmycin (CUBICIN) 500 mg in sodium chloride 0.9 % IVPB  Status:  Discontinued        500 mg 220 mL/hr over 30 Minutes Intravenous  Once 02/07/20 0612 02/07/20 0712   02/07/20 0615  ceFEPIme (MAXIPIME) 2 g in sodium chloride 0.9 % 100 mL IVPB        2 g 200 mL/hr over 30 Minutes Intravenous STAT 02/07/20 0610 02/07/20 0722      Medications: Scheduled Meds: . heparin  5,000 Units Subcutaneous Q8H  . lidocaine  1 patch Transdermal Q24H  . sodium chloride flush  3 mL Intravenous Q12H  . spironolactone  12.5 mg Oral Daily   Continuous Infusions: . vancomycin 1,000 mg (02/08/20 0448)   PRN Meds:.acetaminophen **OR** acetaminophen, ketorolac, ondansetron **OR** ondansetron (ZOFRAN) IV, oxyCODONE **OR** oxyCODONE, traMADol    Objective: Weight change:   Intake/Output Summary (Last 24 hours) at 02/08/2020 1355 Last data filed at 02/08/2020 0530 Gross per 24 hour  Intake 78.04 ml  Output 400 ml  Net -321.96 ml   Blood pressure (!) 130/104, pulse 62, temperature 97.8 F (36.6 C), temperature source Oral, resp. rate 20, height 5\' 11"  (1.803 m), weight 61.7 kg, SpO2 97 %. Temp:  [97.8 F (36.6 C)-98.6 F (37 C)] 97.8 F (36.6 C) (11/29 1242) Pulse Rate:  [59-70] 62 (11/29 1251) Resp:  [19-20] 20 (11/29 1242) BP: (119-131)/(83-104) 130/104 (11/29  1251) SpO2:  [97 %-99 %] 97 % (11/29 1242)  Physical Exam: General: Alert and awake, oriented x3, pale, flat affect HEENT: anicteric sclera, EOMI CVS regular rate, normal no mgr heard Chest: , no wheezing, no respiratory distress, no rales Abdomen: soft non-distended,  Extremities: no edema or deformity noted bilaterally Skin:   02/08/2020 vasculitis rash:      Neuro: nonfocal  CBC:    BMET Recent Labs    02/07/20 0623 02/08/20 0415  NA 139 135  K 4.4 4.6  CL 105 104  CO2 24 23  GLUCOSE 78 95  BUN 10 19  CREATININE 0.99 1.02  CALCIUM 8.6* 8.2*     Liver Panel  Recent Labs    02/07/20 0623  PROT 7.5  ALBUMIN 3.2*  AST 59*  ALT 51*  ALKPHOS 218*  BILITOT 0.8       Sedimentation Rate No results for input(s): ESRSEDRATE in the last 72 hours. C-Reactive Protein No results for input(s): CRP in the last 72 hours.  Micro Results: Recent Results (from the past 720 hour(s))  Blood Culture (routine x 2)     Status: None (Preliminary result)   Collection Time: 02/07/20  6:23 AM   Specimen: BLOOD RIGHT HAND  Result Value Ref Range Status  Specimen Description   Final    BLOOD RIGHT HAND Performed at [facility], [address] [place], [place] [address]    Special Requests   Final    BOTTLES DRAWN AEROBIC AND ANAEROBIC Blood Culture results may not be optimal due to an inadequate volume of blood received in culture bottles Performed at [facility], [address], [place], [place] [address]    Culture   Final    NO GROWTH < 24 HOURS Performed at [facility], [address], [place], [place] [address]    Report Status PENDING  Incomplete  Blood Culture (routine x 2)     Status: None (Preliminary result)   Collection Time: 02/07/20  6:23 AM   Specimen: BLOOD RIGHT FOREARM  Result Value Ref Range Status   Specimen Description   Final    BLOOD RIGHT FOREARM Performed at [facility], [address] [place], [place] [address]    Special Requests   Final    BOTTLES DRAWN AEROBIC AND ANAEROBIC Blood Culture adequate volume Performed at [facility], [address] [place], [place] [address]    Culture   Final    NO GROWTH < 24 HOURS Performed at [facility], [address], [place], [place] [address]    Report Status PENDING  Incomplete  Urine culture     Status: None   Collection Time: 02/07/20  6:23 AM   Specimen: In/Out Cath Urine  Result Value Ref Range Status   Specimen Description   Final    IN/OUT CATH URINE Performed at [facility], [address], [place], [place] [address]    Special Requests   Final    URINE, RANDOM Performed at [facility], [address] [place], [place] [address]    Culture   Final    NO GROWTH Performed at [facility], [address] [place], [place] [address]    Report Status 02/08/2020 FINAL  Final  Resp Panel by RT-PCR (Flu A&B, Covid) Nasopharyngeal Swab     Status: None   Collection Time: 02/07/20  6:23 AM   Specimen: Nasopharyngeal Swab; Nasopharyngeal(NP) swabs in vial transport medium  Result Value Ref Range Status   SARS Coronavirus 2 by RT PCR NEGATIVE NEGATIVE Final    Comment: (NOTE) SARS-CoV-2 target nucleic acids are NOT DETECTED.  The SARS-CoV-2 RNA is generally detectable in upper respiratory specimens during the acute phase of infection. The lowest concentration of SARS-CoV-2 viral copies this assay can detect is 138 copies/mL. A negative result does not preclude SARS-Cov-2 infection and should not be used as the sole basis for treatment or other patient management decisions. A negative result may occur with  improper specimen collection/handling, submission of specimen other than nasopharyngeal swab, presence of viral mutation(s) within the areas targeted by this assay, and inadequate number of viral copies(<138  copies/mL). A negative result must be combined with clinical observations, patient history, and epidemiological information. The expected result is Negative.  Fact Sheet for Patients:  BloggerCourse.com  Fact Sheet for Healthcare Providers:  SeriousBroker.it  This test is no t yet approved or cleared by the [place] FDA and  has been authorized for detection and/or diagnosis of SARS-CoV-2 by FDA under an Emergency Use Authorization (EUA). This EUA will remain  in effect (meaning this test can be used) for the duration of the COVID-19 declaration under Section 564(b)(1) of the  Act, 21 U.S.C.section 360bbb-3(b)(1), unless the authorization is terminated  or revoked sooner.       Influenza A by PCR NEGATIVE NEGATIVE Final   Influenza B by PCR NEGATIVE NEGATIVE Final    Comment: (NOTE) The Xpert Xpress SARS-CoV-2/FLU/RSV plus assay is intended as an aid in the diagnosis of influenza from Nasopharyngeal swab specimens and should not be used as a sole basis for treatment. Nasal washings and aspirates are unacceptable for Xpert Xpress SARS-CoV-2/FLU/RSV testing.  Fact Sheet for Patients: BloggerCourse.com  Fact Sheet for Healthcare Providers: SeriousBroker.it  This test is not yet approved or cleared by the [place] FDA and has been authorized for detection and/or diagnosis of SARS-CoV-2 by FDA under an Emergency Use Authorization (EUA). This EUA will remain in effect (meaning this test can be used) for the duration of the COVID-19 declaration under Section 564(b)(1) of the Act, 21 U.S.C. section 360bbb-3(b)(1), unless the authorization is terminated or revoked.  Performed at [facility], [address], [place], [place] [address]     Studies/Results: CT Angio Chest PE W and/or Wo Contrast  Result Date: 02/07/2020 CLINICAL DATA:  Evaluate for  pulmonary embolus. EXAM: CT ANGIOGRAPHY CHEST WITH CONTRAST TECHNIQUE: Multidetector CT imaging of the chest was performed using the standard protocol during bolus administration of intravenous contrast. Multiplanar CT image reconstructions and MIPs were obtained to evaluate the vascular anatomy. CONTRAST:  OMNIPAQUE IOHEXOL 350 MG/ML SOLN COMPARISON:  CT chest 12/22/2019 FINDINGS: Cardiovascular: Satisfactory opacification of the pulmonary arteries to the segmental level. No evidence of pulmonary embolism. Normal heart size. No pericardial effusion. The right atrium and right ventricle appear increased in caliber and there is reflux of contrast material into the IVC and hepatic veins consistent with passive venous congestion due to right heart failure. Mediastinum/Nodes: No enlarged mediastinal, hilar, or axillary lymph nodes. Thyroid gland, trachea, and esophagus demonstrate no significant findings. Lungs/Pleura: No pleural effusion identified. Multifocal bilateral peripheral predominant areas of nodularity, scarring, and architectural distortion identified consistent with sequelae of previous septic emboli. Most of the previously noted nodules demonstrate interval improvement from previous exam: Previous solid subpleural nodule within the lateral right upper lobe now appears cavitary and decreased in size measuring 2.3 x 1.3 cm, image 29/7. Previously this measured 2.4 x 2.1 cm. Nodule within the posterior left upper lobe measures 0.8 x 1.0 cm, image 40/7. Previously 1.6 x 1.4 cm. Previous solid nodule in the superior segment of left lower lobe measuring 1.8 x 1.1 cm, image 90/7 of the previous exam, has resolved in the interval. New cavitary lung nodule within the posterior right lower lobe measures 1.9 cm, image 90/7. Upper Abdomen: No acute abnormality. Musculoskeletal: No chest wall abnormality. No acute or significant osseous findings. Review of the MIP images confirms the above findings. IMPRESSION:  1. No evidence for acute pulmonary embolus. 2. Multifocal bilateral peripheral predominant areas of nodularity, scarring, and architectural distortion consistent with sequelae of previous septic emboli. Most of the previously noted nodules demonstrate interval improvement from previous exam. However, there is a new cavitary lung nodule within the posterior right lower lobe measuring 1.9 cm. 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 3. Enlarged right atrium and right ventricle with reflux of contrast material into the IVC and hepatic veins consistent with passive venous congestion due to right heart failure. Electronically Signed   By: Signa Kell M.D.   On: 02/07/2020 09:57   DG Chest Port 1 View  Result Date: 02/07/2020 CLINICAL DATA:  Questionable sepsis.  Evaluate for abnormality. EXAM: PORTABLE CHEST 1 VIEW COMPARISON:  01/05/2020 FINDINGS: The heart size appears within normal limits. No pleural effusion identified. Bilateral peripheral predominant scarring with multiple upper lobe predominant cystic lucencies likely representing postinflammatory/infectious pneumatoceles. IMPRESSION: Bilateral peripheral predominant scarring with multiple upper lobe predominant cystic lucencies likely representing postinflammatory/infectious pneumatoceles. Electronically Signed   By: Signa Kell M.D.   On: 02/07/2020 07:13   ECHOCARDIOGRAM COMPLETE  Result Date: 02/07/2020    ECHOCARDIOGRAM REPORT   Patient Name:   Alex [name] Date of Exam: 02/07/2020 Medical Rec #:  [id number]      Height:       71.0 in Accession #:    [id number]     Weight:       136.0 lb Date of Birth:  [birth year]      BSA:          1.790 m Patient Age:    34 years       BP:           131/96 mmHg Patient Gender: M              HR:           56 bpm. Exam Location:  Inpatient Procedure: 2D Echo Indications:    Endocarditis I38  History:        Patient has prior history of Echocardiogram examinations, most                  recent 12/14/2019. CHF, Endocarditis; Risk Factors:Polysubstance                 Abuse.  Sonographer:    Thurman Coyer RDCS (AE) Referring Phys: [id number] Jae Dire  Sonographer Comments: Image acquisition challenging due to uncooperative patient. Unable to finish last few images due to poor patient compliance. IMPRESSIONS  1. Left ventricular ejection fraction, by estimation, is 60 to 65%. The left ventricle has normal function. The left ventricle has no regional wall motion abnormalities. There is mild left ventricular hypertrophy. Left ventricular diastolic parameters were normal. There is the interventricular septum is flattened in systole and diastole, consistent with right ventricular pressure and volume overload.  2. Coarse trabeculation of the RV apex. Right ventricular systolic function is mildly reduced. The right ventricular size is severely enlarged.  3. Right atrial size was severely dilated.  4. The mitral valve is abnormal. Trivial mitral valve regurgitation. There is mild late systolic prolapse of multiple scallops of the posterior leaflet of the mitral valve.  5. Mobile vegetations noted on the posterior and anterior TV leaflets (measures 1.5 x 0.7 cm posterior and up to 2.21 x 0.5 cm on the anterior leaflet).. The tricuspid valve is abnormal. Tricuspid valve regurgitation is severe.  6. The aortic valve is tricuspid. Aortic valve regurgitation is not visualized.  7. The inferior vena cava is dilated in size with <50% respiratory variability, suggesting right atrial pressure of 15 mmHg. Comparison(s): 12/14/2019: LVEF 60-65%, prior vegetations noted on the TV. FINDINGS  Left Ventricle: Left ventricular ejection fraction, by estimation, is 60 to 65%. The left ventricle has normal function. The left ventricle has no regional wall motion abnormalities. The left ventricular internal cavity size was normal in size. There is  mild left ventricular hypertrophy. The interventricular septum is flattened  in systole and diastole, consistent with right ventricular pressure and volume overload. Left ventricular diastolic parameters were normal. Right Ventricle: Coarse trabeculation of the RV apex. The right ventricular size  is severely enlarged. No increase in right ventricular wall thickness. Right ventricular systolic function is mildly reduced. Left Atrium: Left atrial size was normal in size. Right Atrium: Right atrial size was severely dilated. Pericardium: There is no evidence of pericardial effusion. Mitral Valve: The mitral valve is abnormal. There is mild late systolic prolapse of multiple scallops of the posterior leaflet of the mitral valve. There is mild thickening of the mitral valve leaflet(s). Trivial mitral valve regurgitation. Tricuspid Valve: Mobile vegetations noted on the posterior and anterior TV leaflets (measures 1.5 x 0.7 cm posterior and up to 2.21 x 0.5 cm on the anterior leaflet). The tricuspid valve is abnormal. Tricuspid valve regurgitation is severe. The flow in the hepatic veins is reversed during ventricular systole. Aortic Valve: The aortic valve is tricuspid. Aortic valve regurgitation is not visualized. Pulmonic Valve: The pulmonic valve was grossly normal. Pulmonic valve regurgitation is trivial. Aorta: The aortic root and ascending aorta are structurally normal, with no evidence of dilitation. Venous: The inferior vena cava is dilated in size with less than 50% respiratory variability, suggesting right atrial pressure of 15 mmHg. IAS/Shunts: There is left bowing of the interatrial septum, suggestive of elevated right atrial pressure. No atrial level shunt detected by color flow Doppler.  LEFT VENTRICLE PLAX 2D LVIDd:         4.40 cm  Diastology LVIDs:         3.10 cm  LV e' medial:    13.10 cm/s LV PW:         1.10 cm  LV E/e' medial:  4.4 LV IVS:        1.30 cm  LV e' lateral:   16.30 cm/s LVOT diam:     2.10 cm  LV E/e' lateral: 3.5 LV SV:         39 LV SV Index:   22 LVOT Area:      3.46 cm  LEFT ATRIUM           Index       RIGHT ATRIUM           Index LA diam:      3.90 cm 2.18 cm/m  RA Area:     33.30 cm LA Vol (A4C): 33.5 ml 18.72 ml/m RA Volume:   125.00 ml 69.84 ml/m  AORTIC VALVE LVOT Vmax:   59.40 cm/s LVOT Vmean:  38.700 cm/s LVOT VTI:    0.112 m  AORTA Ao Root diam: 2.90 cm MITRAL VALVE MV Area (PHT): 2.76 cm    SHUNTS MV Decel Time: 275 msec    Systemic VTI:  0.11 m MV E velocity: 57.00 cm/s  Systemic Diam: 2.10 cm MV A velocity: 37.30 cm/s MV E/A ratio:  1.53 Zoila Shutter MD Electronically signed by Zoila Shutter MD Signature Date/Time: 02/07/2020/4:28:01 PM    Final       Assessment/Plan:  INTERVAL HISTORY:   TTE shows:  Mobile vegetations noted on the posterior and anterior TV leaflets  (measures 1.5 x 0.7 cm posterior and up to 2.21 x 0.5 cm on the anterior  leaflet)..  With severe TR  Principal Problem:   Endocarditis of tricuspid valve Active Problems:   Polysubstance dependence (HCC)   MRSA bacteremia   Septic embolism (HCC)    Alex [name] is a 34 y.o. 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  He completed dapto/doxy, dapto--> zyvox experienced recurrence of chest pain 3 days prior to admission with worsening rash. CXR shows worsening cavitation. TTE continues to show mobile vegetations with severe TR. He has mid to low back pain  MRSA TV endocarditis , prior bacteremia back pain in person living with IV drug use CVTS felt pt not candidate for angiovac during prior admission.  --continue vancomycin --would try to get MRI T and L spine (he claims he requires general anesthesia to tolerate this)  IVDU: needs long term plan for this  HCV: can treat as an outpatient.  Rash: seems c/w with that seen with SAB but I think biopsy would be nice to firm up diagnosis     LOS: 1 day   Acey Lav 02/08/2020, 1:55 PM

## 2020-02-08 NOTE — Progress Notes (Signed)
Called the Radiology centralized scheduling [phone] twice to have pt scheduled MRI with sedation. Left a message on VM. Called again to follow up and service desk said that the MRI tech will call back here.

## 2020-02-08 NOTE — Progress Notes (Signed)
PROGRESS NOTE    Alex RIETH  RCB:[id number] DOB: 03-27-1985 DOA: 02/07/2020 PCP: Patient, No Pcp Per  Chief Complaint  Patient presents with  . Medical Clearance  . Chest Pain   Brief Narrative:  This is Alex [name] 34 year old male with past medical history of IV drug abuse, alcohol abuse, tricuspid valve MRSA endocarditis, HCV, recurrent hospitalizations for endocarditis who presented to the ED in police custody for concerns of chest pain and bilateral lower extremity rash which has been going on for several days.  Patient recently had Alex [name] hospitalization from 10/4-11/9 for TV endocarditis with pulmonary septic emboli paraspinal myositis.  He was initially treated with vancomycin and then switched to daptomycin due to Michaelann Gunnoe rash and discharged on linezolid for 2 weeks.  States that he finished his antibiotics Alex [name] few days ago and then had his presenting symptoms soon after.  Denies any recent IV drug use but has been using amphetamines and marijuana in the past several days.  He was seen by CT surgery during his last hospitalization and was not deemed Alex [name] candidate for TV repair or Angiomax.  ED Course: Afebrile, hemodynamically stable, on room air.  O2 occasionally briefly dropped to 80s while I was in the room and resolved spontaneously.  Notable Labs: Alk phos 218, AST 59, ALT 51, lactic acid 2.0-> 1.5, WBC 8.6, UA with Hb. Notable Imaging: CXR: Bilateral predominant scarring with multiple upper lobe predominant cystic lucencies likely representing postinflammatory/infectious pneumatoceles.  CTA chest: No acute PE, consistent with multifocal septic emboli most of the previously noted nodules demonstrate interval improvement however there is Burel Kahre new cavitary lung nodule in the posterior right lower lobe measuring 1.9 cm concerning for recurrent septic emboli, enlarged RA and RV with reflux of contrast into the IVC and hepatic veins consistent with passive venous congestion due to right heart failure. Patient  received daptomycin, cefepime, LR.   Assessment & Plan:   Principal Problem:   Endocarditis of tricuspid valve Active Problems:   Polysubstance dependence (HCC)   MRSA bacteremia   Vasculitis (HCC)   Septic embolism (HCC)   Nonspecific chest pain  1. Septic Emboli  Tricuspid Valve Endocarditis  History of Staph Aureus Bacteremia  History of Paraspinal Myositis  Concern for Recurrent/Nonresolved disease  Alex [name]. Sepsis ruled out b. History of MRSA TV endocarditis with multiple hospitalizations in the past year c. Discharged on 11/9 with plan to complete additional 2 weeks of linezolid and then follow up with infectious disease in clinic d. Dr. Kipp Brood to review case and make recommendation e. MRI T/L spine when able f. Continue vancomycin per ID g. Echo with mobile vegetations on the posterior and anterior TV leaflets, severe TV regurgitation  h. ID consulted - recommend retrial of vancomycin, punch bx of rash, MRI T/L spine i. Blood and urine cx pending  2. Right Sided Heart Failure  Alex [name]. Echo with EF 80-32%, RV systolic function mildly reduced, flattening of the IV septum b. Cardiology consult, appreciate recs c. Continue spironolactone d. Will add lasix e. Strict I/O, daily weights  3. Polysubstance abuse  Hx IVDU Alex [name]. Denies recent IVDU, but admits to recent mj, meth, heroin use b. Strongly encouraged cessation c. Discussed and recommended suboxone, he's declined this  4. Hepatitis C 1. Outpatient f/u with ID  5. Anemia, Normocytic 1. Stable, follow  6. Rash Alex [name]. Surgery consult for bx, appreciate assistance  DVT prophylaxis: heparin  Code Status: full  Family Communication: none at bedside Disposition:   Status is: Inpatient  Remains inpatient appropriate because:Inpatient level of care appropriate due to severity of illness   Dispo: The patient is from: Home              Anticipated d/c is to: Home              Anticipated d/c date is: > 3 days               Patient currently is not medically stable to d/c.  Consultants:   ID  CT surgery  Cardiology  General surgery  Procedures:  Echo IMPRESSIONS    1. Left ventricular ejection fraction, by estimation, is 60 to 65%. The  left ventricle has normal function. The left ventricle has no regional  wall motion abnormalities. There is mild left ventricular hypertrophy.  Left ventricular diastolic parameters  were normal. There is the interventricular septum is flattened in systole  and diastole, consistent with right ventricular pressure and volume  overload.  2. Coarse trabeculation of the RV apex. Right ventricular systolic  function is mildly reduced. The right ventricular size is severely  enlarged.  3. Right atrial size was severely dilated.  4. The mitral valve is abnormal. Trivial mitral valve regurgitation.  There is mild late systolic prolapse of multiple scallops of the posterior  leaflet of the mitral valve.  5. Mobile vegetations noted on the posterior and anterior TV leaflets  (measures 1.5 x 0.7 cm posterior and up to 2.21 x 0.5 cm on the anterior  leaflet).. The tricuspid valve is abnormal. Tricuspid valve regurgitation  is severe.  6. The aortic valve is tricuspid. Aortic valve regurgitation is not  visualized.  7. The inferior vena cava is dilated in size with <50% respiratory  variability, suggesting right atrial pressure of 15 mmHg.   Comparison(s): 12/14/2019: LVEF 60-65%, prior vegetations noted on the TV.   Antimicrobials:  Anti-infectives (From admission, onward)   Start     Dose/Rate Route Frequency Ordered Stop   02/08/20 0500  vancomycin (VANCOCIN) IVPB 1000 mg/200 mL premix        1,000 mg 200 mL/hr over 60 Minutes Intravenous Every 12 hours 02/07/20 1631     02/07/20 1730  vancomycin (VANCOREADY) IVPB 1500 mg/300 mL        1,500 mg 150 mL/hr over 120 Minutes Intravenous  Once 02/07/20 1631 02/07/20 2055   02/07/20 0830  DAPTOmycin (CUBICIN)  500 mg in sodium chloride 0.9 % IVPB        500 mg 220 mL/hr over 30 Minutes Intravenous  Once 02/07/20 0712 02/07/20 0913   02/07/20 0630  DAPTOmycin (CUBICIN) 500 mg in sodium chloride 0.9 % IVPB  Status:  Discontinued        500 mg 220 mL/hr over 30 Minutes Intravenous  Once 02/07/20 0612 02/07/20 0712   02/07/20 0615  ceFEPIme (MAXIPIME) 2 g in sodium chloride 0.9 % 100 mL IVPB        2 g 200 mL/hr over 30 Minutes Intravenous STAT 02/07/20 0610 02/07/20 0722      Subjective: Complains of back pain   Objective: Vitals:   02/07/20 2136 02/08/20 0440 02/08/20 1242 02/08/20 1251  BP: 119/83 (!) 121/93 (!) 131/100 (!) 130/104  Pulse: 66 70 (!) 59 62  Resp: 19 20 20    Temp: 98.6 F (37 C)  97.8 F (36.6 C)   TempSrc: Oral  Oral   SpO2: 99% 98% 97%   Weight:      Height:  Intake/Output Summary (Last 24 hours) at 02/08/2020 1531 Last data filed at 02/08/2020 0530 Gross per 24 hour  Intake 78.04 ml  Output 400 ml  Net -321.96 ml   Filed Weights   02/07/20 1323  Weight: 61.7 kg    Examination:  General exam: Appears calm and comfortable  Respiratory system: unlabored Cardiovascular system: S1 & S2 heard, RRR. Gastrointestinal system: Abdomen is nondistended, soft and nontender Central nervous system: Alert and oriented. No focal neurological deficits. Extremities: bilateral LE rash  Psychiatry: Judgement and insight appear normal. Mood & affect appropriate.     Data Reviewed: I have personally reviewed following labs and imaging studies  CBC: Recent Labs  Lab 02/07/20 0623 02/08/20 0415  WBC 8.6 7.2  NEUTROABS 6.0  --   HGB 10.4* 9.2*  HCT 35.0* 30.5*  MCV 89.3 86.2  PLT 212 546    Basic Metabolic Panel: Recent Labs  Lab 02/07/20 0623 02/08/20 0415  NA 139 135  K 4.4 4.6  CL 105 104  CO2 24 23  GLUCOSE 78 95  BUN 10 19  CREATININE 0.99 1.02  CALCIUM 8.6* 8.2*    GFR: Estimated Creatinine Clearance: 89.1 mL/min (by C-G formula based  on SCr of 1.02 mg/dL).  Liver Function Tests: Recent Labs  Lab 02/07/20 0623  AST 59*  ALT 51*  ALKPHOS 218*  BILITOT 0.8  PROT 7.5  ALBUMIN 3.2*    CBG: No results for input(s): GLUCAP in the last 168 hours.   Recent Results (from the past 240 hour(s))  Blood Culture (routine x 2)     Status: None (Preliminary result)   Collection Time: 02/07/20  6:23 AM   Specimen: BLOOD RIGHT HAND  Result Value Ref Range Status   Specimen Description   Final    BLOOD RIGHT HAND Performed at [facility] [address], [place], [place] [address]    Special Requests   Final    BOTTLES DRAWN AEROBIC AND ANAEROBIC Blood Culture results may not be optimal due to an inadequate volume of blood received in culture bottles Performed at [facility] [address] [place], [place] [address]    Culture   Final    NO GROWTH < 24 HOURS Performed at [facility] [address], [place], [place] [address]    Report Status PENDING  Incomplete  Blood Culture (routine x 2)     Status: None (Preliminary result)   Collection Time: 02/07/20  6:23 AM   Specimen: BLOOD RIGHT FOREARM  Result Value Ref Range Status   Specimen Description   Final    BLOOD RIGHT FOREARM Performed at [facility] [address] [place], [place] [address]    Special Requests   Final    BOTTLES DRAWN AEROBIC AND ANAEROBIC Blood Culture adequate volume Performed at [facility] [address], [place], [place] [address]    Culture   Final    NO GROWTH < 24 HOURS Performed at [facility] [address] [place], [place] [address]    Report Status PENDING  Incomplete  Urine culture     Status: None   Collection Time: 02/07/20  6:23 AM   Specimen: In/Out Cath Urine  Result Value Ref Range Status   Specimen Description   Final    IN/OUT CATH URINE Performed at [facility] [address], [place],  [place] [address]    Special Requests   Final  URINE, RANDOM Performed at [facility], [place] [address], [place], [place] [address]    Culture   Final    NO GROWTH Performed at [facility], [place] [address], [place], [place] [address]    Report Status 02/08/2020 FINAL  Final  Resp Panel by RT-PCR (Flu Kamaiyah Uselton&B, Covid) Nasopharyngeal Swab     Status: None   Collection Time: 02/07/20  6:23 AM   Specimen: Nasopharyngeal Swab; Nasopharyngeal(NP) swabs in vial transport medium  Result Value Ref Range Status   SARS Coronavirus 2 by RT PCR NEGATIVE NEGATIVE Final    Comment: (NOTE) SARS-CoV-2 target nucleic acids are NOT DETECTED.  The SARS-CoV-2 RNA is generally detectable in upper respiratory specimens during the acute phase of infection. The lowest concentration of SARS-CoV-2 viral copies this assay can detect is 138 copies/mL. Genevie Elman negative result does not preclude SARS-Cov-2 infection and should not be used as the sole basis for treatment or other patient management decisions. Bronte Sabado negative result may occur with  improper specimen collection/handling, submission of specimen other than nasopharyngeal swab, presence of viral mutation(s) within the areas targeted by this assay, and inadequate number of viral copies(<138 copies/mL). Lian Tanori negative result must be combined with clinical observations, patient history, and epidemiological information. The expected result is Negative.  Fact Sheet for Patients:  EntrepreneurPulse.com.au  Fact Sheet for Healthcare Providers:  IncredibleEmployment.be  This test is no t yet approved or cleared by the [place] FDA and  has been authorized for detection and/or diagnosis of SARS-CoV-2 by FDA under an Emergency Use Authorization (EUA). This EUA will remain  in effect (meaning this test can be used) for the duration of the COVID-19 declaration under Section 564(b)(1) of the Act, 21 U.S.C.section  360bbb-3(b)(1), unless the authorization is terminated  or revoked sooner.       Influenza Yoceline Bazar by PCR NEGATIVE NEGATIVE Final   Influenza B by PCR NEGATIVE NEGATIVE Final    Comment: (NOTE) The Xpert Xpress SARS-CoV-2/FLU/RSV plus assay is intended as an aid in the diagnosis of influenza from Nasopharyngeal swab specimens and should not be used as Meloni Hinz sole basis for treatment. Nasal washings and aspirates are unacceptable for Xpert Xpress SARS-CoV-2/FLU/RSV testing.  Fact Sheet for Patients: EntrepreneurPulse.com.au  Fact Sheet for Healthcare Providers: IncredibleEmployment.be  This test is not yet approved or cleared by the [place] FDA and has been authorized for detection and/or diagnosis of SARS-CoV-2 by FDA under an Emergency Use Authorization (EUA). This EUA will remain in effect (meaning this test can be used) for the duration of the COVID-19 declaration under Section 564(b)(1) of the Act, 21 U.S.C. section 360bbb-3(b)(1), unless the authorization is terminated or revoked.  Performed at [facility], [place] [address] [place], [place] [address]          Radiology Studies: CT Angio Chest PE W and/or Wo Contrast  Result Date: 02/07/2020 CLINICAL DATA:  Evaluate for pulmonary embolus. EXAM: CT ANGIOGRAPHY CHEST WITH CONTRAST TECHNIQUE: Multidetector CT imaging of the chest was performed using the standard protocol during bolus administration of intravenous contrast. Multiplanar CT image reconstructions and MIPs were obtained to evaluate the vascular anatomy. CONTRAST:  143m OMNIPAQUE IOHEXOL 350 MG/ML SOLN COMPARISON:  CT chest 12/22/2019 FINDINGS: Cardiovascular: Satisfactory opacification of the pulmonary arteries to the segmental level. No evidence of pulmonary embolism. Normal heart size. No pericardial effusion. The right atrium and right ventricle appear increased in caliber and there is reflux of contrast  material into the IVC and  hepatic veins consistent with passive venous congestion due to right heart failure. Mediastinum/Nodes: No enlarged mediastinal, hilar, or axillary lymph nodes. Thyroid gland, trachea, and esophagus demonstrate no significant findings. Lungs/Pleura: No pleural effusion identified. Multifocal bilateral peripheral predominant areas of nodularity, scarring, and architectural distortion identified consistent with sequelae of previous septic emboli. Most of the previously noted nodules demonstrate interval improvement from previous exam: Previous solid subpleural nodule within the lateral right upper lobe now appears cavitary and decreased in size measuring 2.3 x 1.3 cm, image 29/7. Previously this measured 2.4 x 2.1 cm. Nodule within the posterior left upper lobe measures 0.8 x 1.0 cm, image 40/7. Previously 1.6 x 1.4 cm. Previous solid nodule in the superior segment of left lower lobe measuring 1.8 x 1.1 cm, image 90/7 of the previous exam, has resolved in the interval. New cavitary lung nodule within the posterior right lower lobe measures 1.9 cm, image 90/7. Upper Abdomen: No acute abnormality. Musculoskeletal: No chest wall abnormality. No acute or significant osseous findings. Review of the MIP images confirms the above findings. IMPRESSION: 1. No evidence for acute pulmonary embolus. 2. Multifocal bilateral peripheral predominant areas of nodularity, scarring, and architectural distortion consistent with sequelae of previous septic emboli. Most of the previously noted nodules demonstrate interval improvement from previous exam. However, there is Chanice Brenton new cavitary lung nodule within the posterior right lower lobe measuring 1.9 cm. 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 3. Enlarged right atrium and right ventricle with reflux of contrast material into the IVC and hepatic veins consistent with passive venous congestion due to right heart failure. Electronically Signed   By:  Kerby Moors M.D.   On: 02/07/2020 09:57   DG Chest Port 1 View  Result Date: 02/07/2020 CLINICAL DATA:  Questionable sepsis.  Evaluate for abnormality. EXAM: PORTABLE CHEST 1 VIEW COMPARISON:  01/05/2020 FINDINGS: The heart size appears within normal limits. No pleural effusion identified. Bilateral peripheral predominant scarring with multiple upper lobe predominant cystic lucencies likely representing postinflammatory/infectious pneumatoceles. IMPRESSION: Bilateral peripheral predominant scarring with multiple upper lobe predominant cystic lucencies likely representing postinflammatory/infectious pneumatoceles. Electronically Signed   By: Kerby Moors M.D.   On: 02/07/2020 07:13   ECHOCARDIOGRAM COMPLETE  Result Date: 02/07/2020    ECHOCARDIOGRAM REPORT   Patient Name:   Alex [name] Date of Exam: 02/07/2020 Medical Rec #:  [id number]      Height:       71.0 in Accession #:    [id number]     Weight:       136.0 lb Date of Birth:  [birth year]      BSA:          1.790 m Patient Age:    89 years       BP:           131/96 mmHg Patient Gender: M              HR:           56 bpm. Exam Location:  Inpatient Procedure: 2D Echo Indications:    Endocarditis I38  History:        Patient has prior history of Echocardiogram examinations, most                 recent 12/14/2019. CHF, Endocarditis; Risk Factors:Polysubstance                 Abuse.  Sonographer:    Mikki Santee RDCS (AE) Referring Phys: [id number] Harold Hedge  Sonographer Comments: Image acquisition challenging due to uncooperative patient. Unable to finish last few images due to poor patient compliance. IMPRESSIONS  1. Left ventricular ejection fraction, by estimation, is 60 to 65%. The left ventricle has normal function. The left ventricle has no regional wall motion abnormalities. There is mild left ventricular hypertrophy. Left ventricular diastolic parameters were normal. There is the interventricular septum is flattened in systole and  diastole, consistent with right ventricular pressure and volume overload.  2. Coarse trabeculation of the RV apex. Right ventricular systolic function is mildly reduced. The right ventricular size is severely enlarged.  3. Right atrial size was severely dilated.  4. The mitral valve is abnormal. Trivial mitral valve regurgitation. There is mild late systolic prolapse of multiple scallops of the posterior leaflet of the mitral valve.  5. Mobile vegetations noted on the posterior and anterior TV leaflets (measures 1.5 x 0.7 cm posterior and up to 2.21 x 0.5 cm on the anterior leaflet).. The tricuspid valve is abnormal. Tricuspid valve regurgitation is severe.  6. The aortic valve is tricuspid. Aortic valve regurgitation is not visualized.  7. The inferior vena cava is dilated in size with <50% respiratory variability, suggesting right atrial pressure of 15 mmHg. Comparison(s): 12/14/2019: LVEF 60-65%, prior vegetations noted on the TV. FINDINGS  Left Ventricle: Left ventricular ejection fraction, by estimation, is 60 to 65%. The left ventricle has normal function. The left ventricle has no regional wall motion abnormalities. The left ventricular internal cavity size was normal in size. There is  mild left ventricular hypertrophy. The interventricular septum is flattened in systole and diastole, consistent with right ventricular pressure and volume overload. Left ventricular diastolic parameters were normal. Right Ventricle: Coarse trabeculation of the RV apex. The right ventricular size is severely enlarged. No increase in right ventricular wall thickness. Right ventricular systolic function is mildly reduced. Left Atrium: Left atrial size was normal in size. Right Atrium: Right atrial size was severely dilated. Pericardium: There is no evidence of pericardial effusion. Mitral Valve: The mitral valve is abnormal. There is mild late systolic prolapse of multiple scallops of the posterior leaflet of the mitral valve.  There is mild thickening of the mitral valve leaflet(s). Trivial mitral valve regurgitation. Tricuspid Valve: Mobile vegetations noted on the posterior and anterior TV leaflets (measures 1.5 x 0.7 cm posterior and up to 2.21 x 0.5 cm on the anterior leaflet). The tricuspid valve is abnormal. Tricuspid valve regurgitation is severe. The flow in the hepatic veins is reversed during ventricular systole. Aortic Valve: The aortic valve is tricuspid. Aortic valve regurgitation is not visualized. Pulmonic Valve: The pulmonic valve was grossly normal. Pulmonic valve regurgitation is trivial. Aorta: The aortic root and ascending aorta are structurally normal, with no evidence of dilitation. Venous: The inferior vena cava is dilated in size with less than 50% respiratory variability, suggesting right atrial pressure of 15 mmHg. IAS/Shunts: There is left bowing of the interatrial septum, suggestive of elevated right atrial pressure. No atrial level shunt detected by color flow Doppler.  LEFT VENTRICLE PLAX 2D LVIDd:         4.40 cm  Diastology LVIDs:         3.10 cm  LV e' medial:    13.10 cm/s LV PW:         1.10 cm  LV E/e' medial:  4.4 LV IVS:        1.30 cm  LV e' lateral:   16.30 cm/s LVOT diam:  2.10 cm  LV E/e' lateral: 3.5 LV SV:         39 LV SV Index:   22 LVOT Area:     3.46 cm  LEFT ATRIUM           Index       RIGHT ATRIUM           Index LA diam:      3.90 cm 2.18 cm/m  RA Area:     33.30 cm LA Vol (A4C): 33.5 ml 18.72 ml/m RA Volume:   125.00 ml 69.84 ml/m  AORTIC VALVE LVOT Vmax:   59.40 cm/s LVOT Vmean:  38.700 cm/s LVOT VTI:    0.112 m  AORTA Ao Root diam: 2.90 cm MITRAL VALVE MV Area (PHT): 2.76 cm    SHUNTS MV Decel Time: 275 msec    Systemic VTI:  0.11 m MV E velocity: 57.00 cm/s  Systemic Diam: 2.10 cm MV Jenisa Monty velocity: 37.30 cm/s MV E/Layne Dilauro ratio:  1.53 Lyman Bishop MD Electronically signed by Lyman Bishop MD Signature Date/Time: 02/07/2020/4:28:01 PM    Final         Scheduled Meds: .  heparin  5,000 Units Subcutaneous Q8H  . lidocaine  1 patch Transdermal Q24H  . sodium chloride flush  3 mL Intravenous Q12H  . spironolactone  12.5 mg Oral Daily   Continuous Infusions: . vancomycin 1,000 mg (02/08/20 0448)     LOS: 1 day    Time spent: over 30 min    Fayrene Helper, MD [facility]   To contact the attending provider between 7A-7P or the covering provider during after hours 7P-7A, please log into the web site [email or web address] and access using universal [place] password for that web site. If you do not have the password, please call the hospital operator.  02/08/2020, 3:31 PM

## 2020-02-09 DIAGNOSIS — I10 Essential (primary) hypertension: Secondary | ICD-10-CM

## 2020-02-09 LAB — CBC
HCT: 32.9 % — ABNORMAL LOW (ref 39.0–52.0)
Hemoglobin: 9.9 g/dL — ABNORMAL LOW (ref 13.0–17.0)
MCH: 26.3 pg (ref 26.0–34.0)
MCHC: 30.1 g/dL (ref 30.0–36.0)
MCV: 87.3 fL (ref 80.0–100.0)
Platelets: 189 10*3/uL (ref 150–400)
RBC: 3.77 MIL/uL — ABNORMAL LOW (ref 4.22–5.81)
RDW: 19.1 % — ABNORMAL HIGH (ref 11.5–15.5)
WBC: 7.1 10*3/uL (ref 4.0–10.5)
nRBC: 0 % (ref 0.0–0.2)

## 2020-02-09 LAB — COMPREHENSIVE METABOLIC PANEL
ALT: 47 U/L — ABNORMAL HIGH (ref 0–44)
AST: 48 U/L — ABNORMAL HIGH (ref 15–41)
Albumin: 2.6 g/dL — ABNORMAL LOW (ref 3.5–5.0)
Alkaline Phosphatase: 187 U/L — ABNORMAL HIGH (ref 38–126)
Anion gap: 6 (ref 5–15)
BUN: 23 mg/dL — ABNORMAL HIGH (ref 6–20)
CO2: 24 mmol/L (ref 22–32)
Calcium: 8.2 mg/dL — ABNORMAL LOW (ref 8.9–10.3)
Chloride: 106 mmol/L (ref 98–111)
Creatinine, Ser: 1.22 mg/dL (ref 0.61–1.24)
GFR, Estimated: >60 mL/min (ref >60)
Glucose, Bld: 100 mg/dL — ABNORMAL HIGH (ref 70–99)
Potassium: 4.7 mmol/L (ref 3.5–5.1)
Sodium: 136 mmol/L (ref 135–145)
Total Bilirubin: 0.6 mg/dL (ref 0.3–1.2)
Total Protein: 5.9 g/dL — ABNORMAL LOW (ref 6.5–8.1)

## 2020-02-09 LAB — PHOSPHORUS: Phosphorus: 3.9 mg/dL (ref 2.5–4.6)

## 2020-02-09 LAB — MAGNESIUM: Magnesium: 1.7 mg/dL (ref 1.7–2.4)

## 2020-02-09 LAB — BRAIN NATRIURETIC PEPTIDE: B Natriuretic Peptide: 395.2 pg/mL — ABNORMAL HIGH (ref 0.0–100.0)

## 2020-02-09 MED ORDER — ENOXAPARIN SODIUM 40 MG/0.4ML ~~LOC~~ SOLN
40.0000 mg | Freq: Every day | SUBCUTANEOUS | Status: DC
  Administered 2020-02-09 – 2020-02-12 (×3): 40 mg via SUBCUTANEOUS
  Filled 2020-02-09 (×3): qty 0.4

## 2020-02-09 NOTE — Progress Notes (Signed)
PROGRESS NOTE    BENIGNO CHECK  OEV:[id number] DOB: Jan 15, 1986 DOA: 02/07/2020 PCP: Patient, No Pcp Per  Chief Complaint  Patient presents with  . Medical Clearance  . Chest Pain   Brief Narrative:  This is Aaralyn Kil 34 year old male with past medical history of IV drug abuse, alcohol abuse, tricuspid valve MRSA endocarditis, HCV, recurrent hospitalizations for endocarditis who presented to the ED in police custody for concerns of chest pain and bilateral lower extremity rash which has been going on for several days.  Patient recently had [facility] hospitalization from 10/4-11/9 for TV endocarditis with pulmonary septic emboli paraspinal myositis.  He was initially treated with vancomycin and then switched to daptomycin due to [place] rash and discharged on linezolid for 2 weeks.  States that he finished his antibiotics Deairra Halleck few days ago and then had his presenting symptoms soon after.  Denies any recent IV drug use but has been using amphetamines and marijuana in the past several days.  He was seen by CT surgery during his last hospitalization and was not deemed Xandrea Clarey candidate for TV repair or Angiomax.  ED Course: Afebrile, hemodynamically stable, on room air.  O2 occasionally briefly dropped to 80s while I was in the room and resolved spontaneously.  Notable Labs: Alk phos 218, AST 59, ALT 51, lactic acid 2.0-> 1.5, WBC 8.6, UA with Hb. Notable Imaging: CXR: Bilateral predominant scarring with multiple upper lobe predominant cystic lucencies likely representing postinflammatory/infectious pneumatoceles.  CTA chest: No acute PE, consistent with multifocal septic emboli most of the previously noted nodules demonstrate interval improvement however there is Ryshawn Sanzone new cavitary lung nodule in the posterior right lower lobe measuring 1.9 cm concerning for recurrent septic emboli, enlarged RA and RV with reflux of contrast into the IVC and hepatic veins consistent with passive venous congestion due to right heart failure. Patient  received daptomycin, cefepime, LR.   Assessment & Plan:   Principal Problem:   Septic embolism ([place]) Active Problems:   Polysubstance dependence (HCC)   Endocarditis of tricuspid valve   MRSA bacteremia   Vasculitis (HCC)   Nonspecific chest pain  1. Septic Emboli  Tricuspid Valve Endocarditis  History of Staph Aureus Bacteremia  History of Paraspinal Myositis  Concern for Recurrent/Nonresolved disease  Elice Crigger. Sepsis ruled out b. History of MRSA TV endocarditis with multiple hospitalizations in the past year c. Discharged on 11/9 with plan to complete additional 2 weeks of linezolid and then follow up with infectious disease in clinic d. Dr. Kipp Brood to review case and make recommendation -> 11/30 note - Dr. Kipp Brood notes angio [facility] debridement would yield minimal result, continue abx e. MRI T/L spine when able - plan for 12/1 AM f. Continue vancomycin per ID g. Echo with mobile vegetations on the posterior and anterior TV leaflets, severe TV regurgitation  h. ID consulted - recommend retrial of vancomycin, punch bx of rash, MRI T/L spine  --- he's requested time to think about whether he wants to go forward with bx (please call surgery if he's agreeable) i. Blood cx NG x 48 hrs and urine cx no growth   2. Right Sided Heart Failure  Cathaleen Korol. Echo with EF 82-99%, RV systolic function mildly reduced, flattening of the IV septum b. Cardiology consult, appreciate recs c. Continue spironolactone d. Will add lasix e. Strict I/O, daily weights  3. Polysubstance abuse  Hx IVDU Amaziah Raisanen. Denies recent IVDU, but admits to recent mj, meth, heroin use b. Strongly encouraged cessation c. Discussed and recommended suboxone,  he's declined this  4. Hepatitis C 1. Outpatient f/u with ID  5. Anemia, Normocytic 1. Stable, follow  6. Rash Mateja Dier. Surgery consult for bx, appreciate assistance - he's resistant today, but will consider this, please call surgery if he's agreeable  DVT prophylaxis: heparin    Code Status: full  Family Communication: none at bedside Disposition:   Status is: Inpatient  Remains inpatient appropriate because:Inpatient level of care appropriate due to severity of illness   Dispo: The patient is from: Home              Anticipated d/c is to: Home              Anticipated d/c date is: > 3 days              Patient currently is not medically stable to d/c.  Consultants:   ID  CT surgery  Cardiology  General surgery  Procedures:  Echo IMPRESSIONS    1. Left ventricular ejection fraction, by estimation, is 60 to 65%. The  left ventricle has normal function. The left ventricle has no regional  wall motion abnormalities. There is mild left ventricular hypertrophy.  Left ventricular diastolic parameters  were normal. There is the interventricular septum is flattened in systole  and diastole, consistent with right ventricular pressure and volume  overload.  2. Coarse trabeculation of the RV apex. Right ventricular systolic  function is mildly reduced. The right ventricular size is severely  enlarged.  3. Right atrial size was severely dilated.  4. The mitral valve is abnormal. Trivial mitral valve regurgitation.  There is mild late systolic prolapse of multiple scallops of the posterior  leaflet of the mitral valve.  5. Mobile vegetations noted on the posterior and anterior TV leaflets  (measures 1.5 x 0.7 cm posterior and up to 2.21 x 0.5 cm on the anterior  leaflet).. The tricuspid valve is abnormal. Tricuspid valve regurgitation  is severe.  6. The aortic valve is tricuspid. Aortic valve regurgitation is not  visualized.  7. The inferior vena cava is dilated in size with <50% respiratory  variability, suggesting right atrial pressure of 15 mmHg.   Comparison(s): 12/14/2019: LVEF 60-65%, prior vegetations noted on the TV.   Antimicrobials:  Anti-infectives (From admission, onward)   Start     Dose/Rate Route Frequency Ordered Stop    02/08/20 0500  vancomycin (VANCOCIN) IVPB 1000 mg/200 mL premix        1,000 mg 200 mL/hr over 60 Minutes Intravenous Every 12 hours 02/07/20 1631     02/07/20 1730  vancomycin (VANCOREADY) IVPB 1500 mg/300 mL        1,500 mg 150 mL/hr over 120 Minutes Intravenous  Once 02/07/20 1631 02/07/20 2055   02/07/20 0830  DAPTOmycin (CUBICIN) 500 mg in sodium chloride 0.9 % IVPB        500 mg 220 mL/hr over 30 Minutes Intravenous  Once 02/07/20 0712 02/07/20 0913   02/07/20 0630  DAPTOmycin (CUBICIN) 500 mg in sodium chloride 0.9 % IVPB  Status:  Discontinued        500 mg 220 mL/hr over 30 Minutes Intravenous  Once 02/07/20 0612 02/07/20 0712   02/07/20 0615  ceFEPIme (MAXIPIME) 2 g in sodium chloride 0.9 % 100 mL IVPB        2 g 200 mL/hr over 30 Minutes Intravenous STAT 02/07/20 0610 02/07/20 0722      Subjective: Continued back pain   Objective: Vitals:   02/08/20 1251 02/08/20  2132 02/09/20 0352 02/09/20 1403  BP: (!) 130/104 (!) 135/103 (!) 133/105 111/86  Pulse: 62 60 64 71  Resp:  16 16 18   Temp:  98.1 F (36.7 C) 98.4 F (36.9 C) 97.9 F (36.6 C)  TempSrc:  Oral Oral Oral  SpO2:  97% 98% 98%  Weight:      Height:        Intake/Output Summary (Last 24 hours) at 02/09/2020 1820 Last data filed at 02/09/2020 1300 Gross per 24 hour  Intake 240 ml  Output 625 ml  Net -385 ml   Filed Weights   02/07/20 1323  Weight: 61.7 kg    Examination:  General: No acute distress. Cardiovascular: Heart sounds show Maksim Peregoy regular rate, and rhythm Lungs: Clear to auscultation bilaterally Abdomen: Soft, nontender, nondistended  Neurological: Alert and oriented 3. Moves all extremities 4 . Cranial nerves II through XII grossly intact. Skin: Warm and dry. No rashes or lesions. Extremities: bilateral LE rash      Data Reviewed: I have personally reviewed following labs and imaging studies  CBC: Recent Labs  Lab 02/07/20 0623 02/08/20 0415 02/09/20 0509  WBC 8.6 7.2 7.1    NEUTROABS 6.0  --   --   HGB 10.4* 9.2* 9.9*  HCT 35.0* 30.5* 32.9*  MCV 89.3 86.2 87.3  PLT 212 176 470    Basic Metabolic Panel: Recent Labs  Lab 02/07/20 0623 02/08/20 0415 02/09/20 0509  NA 139 135 136  K 4.4 4.6 4.7  CL 105 104 106  CO2 24 23 24   GLUCOSE 78 95 100*  BUN 10 19 23*  CREATININE 0.99 1.02 1.22  CALCIUM 8.6* 8.2* 8.2*  MG  --   --  1.7  PHOS  --   --  3.9    GFR: Estimated Creatinine Clearance: 74.5 mL/min (by C-G formula based on SCr of 1.22 mg/dL).  Liver Function Tests: Recent Labs  Lab 02/07/20 0623 02/09/20 0509  AST 59* 48*  ALT 51* 47*  ALKPHOS 218* 187*  BILITOT 0.8 0.6  PROT 7.5 5.9*  ALBUMIN 3.2* 2.6*    CBG: No results for input(s): GLUCAP in the last 168 hours.   Recent Results (from the past 240 hour(s))  Blood Culture (routine x 2)     Status: None (Preliminary result)   Collection Time: 02/07/20  6:23 AM   Specimen: BLOOD RIGHT HAND  Result Value Ref Range Status   Specimen Description   Final    BLOOD RIGHT HAND Performed at [facility] [address] [place], [place] [address]    Special Requests   Final    BOTTLES DRAWN AEROBIC AND ANAEROBIC Blood Culture results may not be optimal due to an inadequate volume of blood received in culture bottles Performed at [facility] [address], [place], [place] [address]    Culture   Final    NO GROWTH 2 DAYS Performed at [facility] [address] [place], [place] [address]    Report Status PENDING  Incomplete  Blood Culture (routine x 2)     Status: None (Preliminary result)   Collection Time: 02/07/20  6:23 AM   Specimen: BLOOD RIGHT FOREARM  Result Value Ref Range Status   Specimen Description   Final    BLOOD RIGHT FOREARM Performed at [facility] [address], [place],  [address]    Special Requests   Final    BOTTLES DRAWN AEROBIC AND ANAEROBIC  Blood Culture adequate  volume Performed at [facility] [address] [place], [place] [address]    Culture   Final    NO GROWTH 2 DAYS Performed at [facility] [address], [place], [place] [address]    Report Status PENDING  Incomplete  Urine culture     Status: None   Collection Time: 02/07/20  6:23 AM   Specimen: In/Out Cath Urine  Result Value Ref Range Status   Specimen Description   Final    IN/OUT CATH URINE Performed at [facility] [address], [place], [place] [address]    Special Requests   Final    URINE, RANDOM Performed at [facility] [address], [place], [place] [address]    Culture   Final    NO GROWTH Performed at [facility], [place] [address], [place], [place] [address]    Report Status 02/08/2020 FINAL  Final  Resp Panel by RT-PCR (Flu Chrishaun Sasso&B, Covid) Nasopharyngeal Swab     Status: None   Collection Time: 02/07/20  6:23 AM   Specimen: Nasopharyngeal Swab; Nasopharyngeal(NP) swabs in vial transport medium  Result Value Ref Range Status   SARS Coronavirus 2 by RT PCR NEGATIVE NEGATIVE Final    Comment: (NOTE) SARS-CoV-2 target nucleic acids are NOT DETECTED.  The SARS-CoV-2 RNA is generally detectable in upper respiratory specimens during the acute phase of infection. The lowest concentration of SARS-CoV-2 viral copies this assay can detect is 138 copies/mL. Sahir Tolson negative result does not preclude SARS-Cov-2 infection and should not be used as the sole basis for treatment or other patient management decisions. Damein Gaunce negative result may occur with  improper specimen collection/handling, submission of specimen other than nasopharyngeal swab, presence of viral mutation(s) within the areas targeted by this assay, and inadequate number of viral copies(<138 copies/mL). Susie Ehresman negative result must be combined with clinical observations, patient history, and epidemiological information. The expected result is  Negative.  Fact Sheet for Patients:  EntrepreneurPulse.com.au  Fact Sheet for Healthcare Providers:  IncredibleEmployment.be  This test is no t yet approved or cleared by the [place] FDA and  has been authorized for detection and/or diagnosis of SARS-CoV-2 by FDA under an Emergency Use Authorization (EUA). This EUA will remain  in effect (meaning this test can be used) for the duration of the COVID-19 declaration under Section 564(b)(1) of the Act, 21 U.S.C.section 360bbb-3(b)(1), unless the authorization is terminated  or revoked sooner.       Influenza Caro Brundidge by PCR NEGATIVE NEGATIVE Final   Influenza B by PCR NEGATIVE NEGATIVE Final    Comment: (NOTE) The Xpert Xpress SARS-CoV-2/FLU/RSV plus assay is intended as an aid in the diagnosis of influenza from Nasopharyngeal swab specimens and should not be used as Pete Merten sole basis for treatment. Nasal washings and aspirates are unacceptable for Xpert Xpress SARS-CoV-2/FLU/RSV testing.  Fact Sheet for Patients: EntrepreneurPulse.com.au  Fact Sheet for Healthcare Providers: IncredibleEmployment.be  This test is not yet approved or cleared by the [place] FDA and has been authorized for detection and/or diagnosis of SARS-CoV-2 by FDA under an Emergency Use Authorization (EUA). This EUA will remain in effect (meaning this test can be used) for the duration of the COVID-19 declaration under Section 564(b)(1) of the Act, 21 U.S.C. section 360bbb-3(b)(1), unless the authorization is terminated or revoked.  Performed at [facility], [place] [address] [place], [place] [address]  Radiology Studies: No results found.      Scheduled Meds: . enoxaparin (LOVENOX) injection  40 mg Subcutaneous Daily  . furosemide  20 mg Oral Daily  . lidocaine  1 patch Transdermal Q24H  . lidocaine-EPINEPHrine  30 mL Intradermal Once  .  pantoprazole  40 mg Oral Daily  . sodium chloride flush  3 mL Intravenous Q12H  . spironolactone  12.5 mg Oral Daily   Continuous Infusions: . vancomycin 1,000 mg (02/09/20 1655)     LOS: 2 days    Time spent: over 30 min    Fayrene Helper, MD [facility]   To contact the attending provider between 7A-7P or the covering provider during after hours 7P-7A, please log into the web site [email or web address] and access using universal [place] password for that web site. If you do not have the password, please call the hospital operator.  02/09/2020, 6:20 PM

## 2020-02-09 NOTE — Progress Notes (Signed)
Subjective: Low to mid back pain and concerned re the need for biopsy   Antibiotics:  Anti-infectives (From admission, onward)   Start     Dose/Rate Route Frequency Ordered Stop   02/08/20 0500  vancomycin (VANCOCIN) IVPB 1000 mg/200 mL premix        1,000 mg 200 mL/hr over 60 Minutes Intravenous Every 12 hours 02/07/20 1631     02/07/20 1730  vancomycin (VANCOREADY) IVPB 1500 mg/300 mL        1,500 mg 150 mL/hr over 120 Minutes Intravenous  Once 02/07/20 1631 02/07/20 2055   02/07/20 0830  DAPTOmycin (CUBICIN) 500 mg in sodium chloride 0.9 % IVPB        500 mg 220 mL/hr over 30 Minutes Intravenous  Once 02/07/20 0712 02/07/20 0913   02/07/20 0630  DAPTOmycin (CUBICIN) 500 mg in sodium chloride 0.9 % IVPB  Status:  Discontinued        500 mg 220 mL/hr over 30 Minutes Intravenous  Once 02/07/20 0612 02/07/20 0712   02/07/20 0615  ceFEPIme (MAXIPIME) 2 g in sodium chloride 0.9 % 100 mL IVPB        2 g 200 mL/hr over 30 Minutes Intravenous STAT 02/07/20 0610 02/07/20 0722      Medications: Scheduled Meds: . enoxaparin (LOVENOX) injection  40 mg Subcutaneous Daily  . furosemide  20 mg Oral Daily  . lidocaine  1 patch Transdermal Q24H  . lidocaine-EPINEPHrine  30 mL Intradermal Once  . pantoprazole  40 mg Oral Daily  . sodium chloride flush  3 mL Intravenous Q12H  . spironolactone  12.5 mg Oral Daily   Continuous Infusions: . vancomycin 1,000 mg (02/09/20 0508)   PRN Meds:.acetaminophen **OR** acetaminophen, ketorolac, ondansetron **OR** ondansetron (ZOFRAN) IV, oxyCODONE **OR** oxyCODONE, traMADol    Objective: Weight change:   Intake/Output Summary (Last 24 hours) at 02/09/2020 1112 Last data filed at 02/09/2020 0900 Gross per 24 hour  Intake 480 ml  Output 600 ml  Net -120 ml   Blood pressure (!) 133/105, pulse 64, temperature 98.4 F (36.9 C), temperature source Oral, resp. rate 16, height 5\' 11"  (1.803 m), weight 61.7 kg, SpO2 98 %. Temp:  [97.8 F  (36.6 C)-98.4 F (36.9 C)] 98.4 F (36.9 C) (11/30 0352) Pulse Rate:  [59-64] 64 (11/30 0352) Resp:  [16-20] 16 (11/30 0352) BP: (130-135)/(100-105) 133/105 (11/30 0352) SpO2:  [97 %-98 %] 98 % (11/30 0352)  Physical Exam: General: Alert and awake, oriented x3, pale, flat affect HEENT: anicteric sclera, EOMI CVS regular rate, normal no mgr heard Chest: , no wheezing, no respiratory distress, no rales Abdomen: soft non-distended,  Extremities: no edema or deformity noted bilaterally Skin:   02/08/2020 vasculitic rash:      Neuro: nonfocal  CBC:    BMET Recent Labs    02/08/20 0415 02/09/20 0509  NA 135 136  K 4.6 4.7  CL 104 106  CO2 23 24  GLUCOSE 95 100*  BUN 19 23*  CREATININE 1.02 1.22  CALCIUM 8.2* 8.2*     Liver Panel  Recent Labs    02/07/20 0623 02/09/20 0509  PROT 7.5 5.9*  ALBUMIN 3.2* 2.6*  AST 59* 48*  ALT 51* 47*  ALKPHOS 218* 187*  BILITOT 0.8 0.6       Sedimentation Rate No results for input(s): ESRSEDRATE in the last 72 hours. C-Reactive Protein No results for input(s): CRP in the last 72 hours.  Micro Results: Recent Results (  from the past 720 hour(s))  Blood Culture (routine x 2)     Status: None (Preliminary result)   Collection Time: 02/07/20  6:23 AM   Specimen: BLOOD RIGHT HAND  Result Value Ref Range Status   Specimen Description   Final    BLOOD RIGHT HAND Performed at [facility], [address], [place], [place] [address]    Special Requests   Final    BOTTLES DRAWN AEROBIC AND ANAEROBIC Blood Culture results may not be optimal due to an inadequate volume of blood received in culture bottles Performed at [facility], [address] [place], [place] [address]    Culture   Final    NO GROWTH 2 DAYS Performed at [facility], [address] [place], [place] [address]    Report Status PENDING  Incomplete  Blood Culture (routine x 2)     Status: None (Preliminary  result)   Collection Time: 02/07/20  6:23 AM   Specimen: BLOOD RIGHT FOREARM  Result Value Ref Range Status   Specimen Description   Final    BLOOD RIGHT FOREARM Performed at [facility], [address] [place], [place] [address]    Special Requests   Final    BOTTLES DRAWN AEROBIC AND ANAEROBIC Blood Culture adequate volume Performed at [facility], [address], [place], [place] [address]    Culture   Final    NO GROWTH 2 DAYS Performed at [facility], [address], [place], [place] [address]    Report Status PENDING  Incomplete  Urine culture     Status: None   Collection Time: 02/07/20  6:23 AM   Specimen: In/Out Cath Urine  Result Value Ref Range Status   Specimen Description   Final    IN/OUT CATH URINE Performed at [facility], [address] [place], [place] [address]    Special Requests   Final    URINE, RANDOM Performed at [facility], [address] [place], [place] [address]    Culture   Final    NO GROWTH Performed at [facility], [address], [place], [place] [address]    Report Status 02/08/2020 FINAL  Final  Resp Panel by RT-PCR (Flu A&B, Covid) Nasopharyngeal Swab     Status: None   Collection Time: 02/07/20  6:23 AM   Specimen: Nasopharyngeal Swab; Nasopharyngeal(NP) swabs in vial transport medium  Result Value Ref Range Status   SARS Coronavirus 2 by RT PCR NEGATIVE NEGATIVE Final    Comment: (NOTE) SARS-CoV-2 target nucleic acids are NOT DETECTED.  The SARS-CoV-2 RNA is generally detectable in upper respiratory specimens during the acute phase of infection. The lowest concentration of SARS-CoV-2 viral copies this assay can detect is 138 copies/mL. A negative result does not preclude SARS-Cov-2 infection and should not be used as the sole basis for treatment or other patient management decisions. A negative result may occur with  improper specimen  collection/handling, submission of specimen other than nasopharyngeal swab, presence of viral mutation(s) within the areas targeted by this assay, and inadequate number of viral copies(<138 copies/mL). A negative result must be combined with clinical observations, patient history, and epidemiological information. The expected result is Negative.  Fact Sheet for Patients:  BloggerCourse.com  Fact Sheet for Healthcare Providers:  SeriousBroker.it  This test is no t yet approved or cleared by the [place] and  has been authorized  for detection and/or diagnosis of SARS-CoV-2 by FDA under an Emergency Use Authorization (EUA). This EUA will remain  in effect (meaning this test can be used) for the duration of the COVID-19 declaration under Section 564(b)(1) of the Act, 21 U.S.C.section 360bbb-3(b)(1), unless the authorization is terminated  or revoked sooner.       Influenza A by PCR NEGATIVE NEGATIVE Final   Influenza B by PCR NEGATIVE NEGATIVE Final    Comment: (NOTE) The Xpert Xpress SARS-CoV-2/FLU/RSV plus assay is intended as an aid in the diagnosis of influenza from Nasopharyngeal swab specimens and should not be used as a sole basis for treatment. Nasal washings and aspirates are unacceptable for Xpert Xpress SARS-CoV-2/FLU/RSV testing.  Fact Sheet for Patients: BloggerCourse.com  Fact Sheet for Healthcare Providers: SeriousBroker.it  This test is not yet approved or cleared by the [place] FDA and has been authorized for detection and/or diagnosis of SARS-CoV-2 by FDA under an Emergency Use Authorization (EUA). This EUA will remain in effect (meaning this test can be used) for the duration of the COVID-19 declaration under Section 564(b)(1) of the Act, 21 U.S.C. section 360bbb-3(b)(1), unless the authorization is terminated or revoked.  Performed at [facility], [address], [place], [place] [address]     Studies/Results: ECHOCARDIOGRAM COMPLETE  Result Date: 02/07/2020    ECHOCARDIOGRAM REPORT   Patient Name:   Alex [name] Date of Exam: 02/07/2020 Medical Rec #:  [id number]      Height:       71.0 in Accession #:    [id number]     Weight:       136.0 lb Date of Birth:  [birth year]      BSA:          1.790 m Patient Age:    34 years       BP:           131/96 mmHg Patient Gender: M              HR:           56 bpm. Exam Location:  Inpatient Procedure: 2D Echo Indications:    Endocarditis I38  History:        Patient has prior history of Echocardiogram examinations, most                 recent 12/14/2019. CHF, Endocarditis; Risk Factors:Polysubstance                 Abuse.  Sonographer:    Thurman Coyer RDCS (AE) Referring Phys: [id number] Jae Dire  Sonographer Comments: Image acquisition challenging due to uncooperative patient. Unable to finish last few images due to poor patient compliance. IMPRESSIONS  1. Left ventricular ejection fraction, by estimation, is 60 to 65%. The left ventricle has normal function. The left ventricle has no regional wall motion abnormalities. There is mild left ventricular hypertrophy. Left ventricular diastolic parameters were normal. There is the interventricular septum is flattened in systole and diastole, consistent with right ventricular pressure and volume overload.  2. Coarse trabeculation of the RV apex. Right ventricular systolic function is mildly reduced. The right ventricular size is severely enlarged.  3. Right atrial size was severely dilated.  4. The mitral valve is abnormal. Trivial mitral valve regurgitation. There is mild late systolic prolapse of multiple scallops of the posterior leaflet of the mitral valve.  5. Mobile vegetations noted on the posterior and anterior TV leaflets (measures 1.5 x 0.7 cm  posterior and up to 2.21 x 0.5 cm on the anterior leaflet).. The tricuspid valve  is abnormal. Tricuspid valve regurgitation is severe.  6. The aortic valve is tricuspid. Aortic valve regurgitation is not visualized.  7. The inferior vena cava is dilated in size with <50% respiratory variability, suggesting right atrial pressure of 15 mmHg. Comparison(s): 12/14/2019: LVEF 60-65%, prior vegetations noted on the TV. FINDINGS  Left Ventricle: Left ventricular ejection fraction, by estimation, is 60 to 65%. The left ventricle has normal function. The left ventricle has no regional wall motion abnormalities. The left ventricular internal cavity size was normal in size. There is  mild left ventricular hypertrophy. The interventricular septum is flattened in systole and diastole, consistent with right ventricular pressure and volume overload. Left ventricular diastolic parameters were normal. Right Ventricle: Coarse trabeculation of the RV apex. The right ventricular size is severely enlarged. No increase in right ventricular wall thickness. Right ventricular systolic function is mildly reduced. Left Atrium: Left atrial size was normal in size. Right Atrium: Right atrial size was severely dilated. Pericardium: There is no evidence of pericardial effusion. Mitral Valve: The mitral valve is abnormal. There is mild late systolic prolapse of multiple scallops of the posterior leaflet of the mitral valve. There is mild thickening of the mitral valve leaflet(s). Trivial mitral valve regurgitation. Tricuspid Valve: Mobile vegetations noted on the posterior and anterior TV leaflets (measures 1.5 x 0.7 cm posterior and up to 2.21 x 0.5 cm on the anterior leaflet). The tricuspid valve is abnormal. Tricuspid valve regurgitation is severe. The flow in the hepatic veins is reversed during ventricular systole. Aortic Valve: The aortic valve is tricuspid. Aortic valve regurgitation is not visualized. Pulmonic Valve: The pulmonic valve was grossly normal. Pulmonic valve regurgitation is trivial. Aorta: The aortic root  and ascending aorta are structurally normal, with no evidence of dilitation. Venous: The inferior vena cava is dilated in size with less than 50% respiratory variability, suggesting right atrial pressure of 15 mmHg. IAS/Shunts: There is left bowing of the interatrial septum, suggestive of elevated right atrial pressure. No atrial level shunt detected by color flow Doppler.  LEFT VENTRICLE PLAX 2D LVIDd:         4.40 cm  Diastology LVIDs:         3.10 cm  LV e' medial:    13.10 cm/s LV PW:         1.10 cm  LV E/e' medial:  4.4 LV IVS:        1.30 cm  LV e' lateral:   16.30 cm/s LVOT diam:     2.10 cm  LV E/e' lateral: 3.5 LV SV:         39 LV SV Index:   22 LVOT Area:     3.46 cm  LEFT ATRIUM           Index       RIGHT ATRIUM           Index LA diam:      3.90 cm 2.18 cm/m  RA Area:     33.30 cm LA Vol (A4C): 33.5 ml 18.72 ml/m RA Volume:   125.00 ml 69.84 ml/m  AORTIC VALVE LVOT Vmax:   59.40 cm/s LVOT Vmean:  38.700 cm/s LVOT VTI:    0.112 m  AORTA Ao Root diam: 2.90 cm MITRAL VALVE MV Area (PHT): 2.76 cm    SHUNTS MV Decel Time: 275 msec    Systemic VTI:  0.11 m MV E  velocity: 57.00 cm/s  Systemic Diam: 2.10 cm MV A velocity: 37.30 cm/s MV E/A ratio:  1.53 Zoila ShutterKenneth Hilty MD Electronically signed by Zoila ShutterKenneth Hilty MD Signature Date/Time: 02/07/2020/4:28:01 PM    Final       Assessment/Plan:  INTERVAL HISTORY:   Pt reluctant re biopsy  Principal Problem:   Septic embolism (HCC) Active Problems:   Polysubstance dependence (HCC)   Endocarditis of tricuspid valve   MRSA bacteremia   Vasculitis (HCC)   Nonspecific chest pain    [name] M Lengel is a 34 y.o. 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  He completed dapto/doxy, dapto--> zyvox experienced recurrence of chest pain 3 days prior to admission with worsening rash. CXR shows worsening cavitation. TTE  continues to show mobile vegetations with severe TR. He has mid to low back pain  MRSA TV endocarditis , prior bacteremia back pain in person living with IV drug use CVTS felt pt not candidate for angiovac during prior admission.  --continue vancomycin --would try to get MRI T and L spine (he claims he requires general anesthesia to tolerate this)  IVDU: needs long term plan for this  HCV: can treat as an outpatient.  Rash: seems c/w with that seen with SAB but I think biopsy would be best to firm up diagnosis     LOS: 2 days   Acey LavCornelius Van Dam 02/09/2020, 11:12 AM

## 2020-02-09 NOTE — Plan of Care (Signed)
  Problem: Nutrition: Goal: Adequate nutrition will be maintained 02/09/2020 1545 by Charmian Muff, RN Outcome: Progressing 02/09/2020 1544 by Charmian Muff, RN Outcome: Progressing   Problem: Pain Managment: Goal: General experience of comfort will improve 02/09/2020 1545 by Charmian Muff, RN Outcome: Progressing 02/09/2020 1544 by Charmian Muff, RN Outcome: Progressing   Problem: Skin Integrity: Goal: Risk for impaired skin integrity will decrease Outcome: Progressing   Problem: Education: Goal: Knowledge of disease or condition will improve Outcome: Progressing Goal: Understanding of discharge needs will improve Outcome: Progressing   Problem: Safety: Goal: Ability to remain free from injury will improve Outcome: Progressing

## 2020-02-09 NOTE — Progress Notes (Signed)
     [address]       [place] [address]             [phone]       Consult received. Must recent echo shows that the vegetation is smaller.  He has completed his course of antibiotics but the vegetation is still present.  Blood cultures have been negative.  He has 1 new pulmonary septic emboli, but all the other ones appear to be healing.  He will require an MRI of this spine to evaluate for an abscess based off of the notes.  Given the size of the tricuspid valve endocarditis I still feel as though angio VAC debridement would still yield minimal result.  If the plan is to continue antibiotics for other sources of infection that I would recommend medical therapy for the endocarditis as well.  Rondia Higginbotham Keane Scrape

## 2020-02-09 NOTE — Consult Note (Signed)
Cardiology Consultation:   Patient ID: Alex [name] MRN: [id number]; DOB: 09-Feb-1986  Admit date: 02/07/2020 Date of Consult: 02/09/2020  Primary Care Provider: Patient, No Pcp Per [facility] Cardiologist: Thurmon FairMihai Croitoru, MD  [facility] Electrophysiologist:  None    Patient Profile:   Alex [name] is a 34 y.o. male with a PMH of recurrent TV endocarditis, polysubstance abuse with IVDA, ETOH use, incarceration, depression with prior suicide attempt, and tobacco abuse, who is being seen today for the evaluation of right-sided heart failure and endocarditis at the request of Dr. Lowell GuitarPowell.  History of Present Illness:   Alex [name] was last evaluated by cardiology during a hospitalization 12/2019 for the evaluation of TV endocarditis with evidence of new mobile vegetations on the tricuspid leaflets which were severely damaged with wide-open/torrential TR on echo that admission. He was felt to be a poor candidate for surgical intervention given ongoing substance abuse. He was ultimately discharged home 01/19/20 on po antibiotics for an additional 2 week course after a 1 month hospitalization.  Unfortunately he presented to the ED 02/07/20 in police custody with complaints of chest pain and bilateral LE rash for several days. He completed his po antibiotics a few days prior to arrival with symptoms onset soon thereafter. He reported abstinence from IV drugs, though admits to heroin, amphetamine, and marijuana use since discharge 01/19/20. He was admitted to medicine for IV antibiotics given c/f recurrent/persistent endocarditis with recurrent septic emboli and acute on chronic right sided heart failure. ID has been following this admission for antibiotic guidance. MRI spine is pending. Biopsy of leg rash is pending, though suspicions high for SAB. Echo this admission showed EF 60-65%, mild LVH, normal LV diastolic function, no RWMA, mildly reduced RV systolic function, severe RAE, MVP, mobile  vegetation on the posterior and anterior TV leaflets with severe TR, though overall no significant change from 12/2019. Cardiology asked to evaluate for right-sided heart failure.  At the time of this evaluation he reports improvement in his LE edema. He reports approximately 7 days ago he notice LE swelling. He initially attributed this to being on his feet for a significant portion of 2-3 days while under the influence of methamphetamine. He reports his breathing has not returned to normal since his last hospitalization, though he attributed some of this to his chronic asthma and ongoing tobacco abuse. He has DOE though denies any significant orthopnea or PND. He has occasional chest discomfort which is not clearly exertional in nature as this sometimes occurs when watching emotional television scenes. He denies palpitations or syncope, though has occasional dizzy spells. We discussed how limited our options are for managing his tricuspid valve disease with his ongoing substance abuse. We discussed use of medications to promote abstinence from opioids, though he is not interesting in using another drug to quit. We discussed right-sided heart failure and the importance of limiting salt and fluid intake. He does state he was drinking 4 giant slushies a day at times.   Past Medical History:  Diagnosis Date  . ADHD (attention deficit hyperactivity disorder)   . Allergy   . Asthma   . CHF (congestive heart failure) (HCC)   . Depressed   . 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 ETOH abuse   . History of suicidal tendencies   . Polysubstance abuse [facility](HCC)     Past Surgical History:  Procedure Laterality Date  . RADIOLOGY WITH ANESTHESIA N/A 12/18/2019   Procedure: MRI-THORACIC MRI WITH AND WITHOUT CONSTRAST;  Surgeon: Radiologist, Medication, MD;  Location: [facility] OR;  Service: Radiology;  Laterality: N/A;  . RADIOLOGY WITH ANESTHESIA N/A 01/14/2020   Procedure: MRI WITH ANESTHESIA OF THORACIC SPINE AND  LUMBAR SPINE;  Surgeon: Radiologist, Medication, MD;  Location: [facility] OR;  Service: Radiology;  Laterality: N/A;  . TEE WITHOUT CARDIOVERSION N/A 12/31/2018   Procedure: TRANSESOPHAGEAL ECHOCARDIOGRAM (TEE);  Surgeon: Thurmon Fair, MD;  Location[facility] ENDOSCOPY;  Service: Cardiovascular;  Laterality: N/A;     Home Medications:  Prior to Admission medications   Medication Sig Start Date End Date Taking? Authorizing Provider  acetaminophen (TYLENOL) 325 MG tablet Take 2 tablets (650 mg total) by mouth every 4 (four) hours as needed for headache or mild pain. 01/19/20  Yes Arrien, York Ram, MD  albuterol (PROVENTIL HFA) [phone] Base) MCG/ACT inhaler Inhale 1-2 puffs into the lungs every 4 (four) hours as needed for wheezing or shortness of breath. 09/08/18  Yes Wieters, Hallie C, PA-C  spironolactone (ALDACTONE) 25 MG tablet Take 0.5 tablets (12.5 mg total) by mouth daily. 01/19/20 02/18/20 Yes Arrien, York Ram, MD  traMADol (ULTRAM) 50 MG tablet Take 1 tablet (50 mg total) by mouth every 6 (six) hours as needed for severe pain. 01/19/20  Yes Arrien, York Ram, MD  ipratropium-albuterol (DUONEB) 0.5-2.5 (3) MG/3ML SOLN Take 3 mLs by nebulization every 6 (six) hours as needed (wheezing). Patient not taking: Reported on 12/28/2018 04/14/18 12/28/18  Elpidio Anis, PA-C  montelukast (SINGULAIR) 10 MG tablet Take 1 tablet (10 mg total) by mouth at bedtime. Patient not taking: Reported on 12/28/2018 07/25/17 12/28/18  Georgetta Haber, NP  traZODone (DESYREL) 50 MG tablet Take 1 tablet (50 mg total) by mouth at bedtime and may repeat dose one time if needed. Patient not taking: Reported on 07/05/2018 04/29/16 12/28/18  Oneta Rack, NP    Inpatient Medications: Scheduled Meds: . furosemide  20 mg Oral Daily  . lidocaine  1 patch Transdermal Q24H  . lidocaine-EPINEPHrine  30 mL Intradermal Once  . pantoprazole  40 mg Oral Daily  . sodium chloride flush  3 mL Intravenous Q12H  .  spironolactone  12.5 mg Oral Daily   Continuous Infusions: . vancomycin 1,000 mg (02/09/20 0508)   PRN Meds: acetaminophen **OR** acetaminophen, ketorolac, ondansetron **OR** ondansetron (ZOFRAN) IV, oxyCODONE **OR** oxyCODONE, traMADol  Allergies:   No Active Allergies  Social History:   Social History   Socioeconomic History  . Marital status: Single    Spouse name: Not on file  . Number of children: Not on file  . Years of education: Not on file  . Highest education level: Not on file  Occupational History  . Not on file  Tobacco Use  . Smoking status: Current Every Day Smoker    Packs/day: 1.00    Years: 10.00    Pack years: 10.00    Types: Cigarettes  . Smokeless tobacco: Never Used  Vaping Use  . Vaping Use: Never used  Substance and Sexual Activity  . Alcohol use: Yes    Alcohol/week: 12.0 standard drinks    Types: 12 Cans of beer per week    Comment: 12 beers daily  . Drug use: Yes    Frequency: 6.0 times per week    Types: Marijuana, Cocaine, Heroin, Fentanyl, IV    Comment: daily use cocaine (snort)  . Sexual activity: Not Currently  Other Topics Concern  . Not on file  Social History Narrative  . Not on file   Social Determinants  of Health   Financial Resource Strain:   . Difficulty of Paying Living Expenses: Not on file  Food Insecurity:   . Worried About Programme researcher, broadcasting/film/video in the Last Year: Not on file  . Ran Out of Food in the Last Year: Not on file  Transportation Needs:   . Lack of Transportation (Medical): Not on file  . Lack of Transportation (Non-Medical): Not on file  Physical Activity:   . Days of Exercise per Week: Not on file  . Minutes of Exercise per Session: Not on file  Stress:   . Feeling of Stress : Not on file  Social Connections:   . Frequency of Communication with Friends and Family: Not on file  . Frequency of Social Gatherings with Friends and Family: Not on file  . Attends Religious Services: Not on file  . Active  Member of Clubs or Organizations: Not on file  . Attends Banker Meetings: Not on file  . Marital Status: Not on file  Intimate Partner Violence:   . Fear of Current or Ex-Partner: Not on file  . Emotionally Abused: Not on file  . Physically Abused: Not on file  . Sexually Abused: Not on file    Family History:    Family History  Problem Relation Age of Onset  . COPD Other      ROS:  Please see the history of present illness.   All other ROS reviewed and negative.     Physical Exam/Data:   Vitals:   02/08/20 1242 02/08/20 1251 02/08/20 2132 02/09/20 0352  BP: (!) 131/100 (!) 130/104 (!) 135/103 (!) 133/105  Pulse: (!) 59 62 60 64  Resp: 20  16 16   Temp: 97.8 F (36.6 C)  98.1 F (36.7 C) 98.4 F (36.9 C)  TempSrc: Oral  Oral Oral  SpO2: 97%  97% 98%  Weight:      Height:        Intake/Output Summary (Last 24 hours) at 02/09/2020 0917 Last data filed at 02/09/2020 1610 Gross per 24 hour  Intake 240 ml  Output 600 ml  Net -360 ml   Last 3 Weights 02/07/2020 01/19/2020 01/18/2020  Weight (lbs) 136 lb 136 lb 14.5 oz 133 lb 6.1 oz  Weight (kg) 61.689 kg 62.1 kg 60.5 kg     Body mass index is 18.97 kg/m.  General:  Well nourished, well developed, in no acute distress HEENT: sclera anicteric Neck: no JVD Vascular: No carotid bruits; distal pulses 2+ bilaterally without bruits  Cardiac:  normal S1, S2; RRR; +murmur, no rubs or gallops Lungs:  clear to auscultation bilaterally, no wheezing, rhonchi or rales  Abd: soft, nontender, no hepatomegaly  Ext: no edema Musculoskeletal:  No deformities, BUE and BLE strength normal and equal Skin: warm and dry  Neuro:  CNs 2-12 intact, no focal abnormalities noted Psych:  Normal affect   EKG:  The EKG was personally reviewed and demonstrates:  Sinus rhythm, rate 87 bpm, 1st degree AV block, no TWI, no STE/D. Telemetry:  Telemetry was personally reviewed and demonstrates:  Sinus rhythm with 1st degree AV  block  Relevant CV Studies: Echocardiogram 02/07/20: 1. Left ventricular ejection fraction, by estimation, is 60 to 65%. The  left ventricle has normal function. The left ventricle has no regional  wall motion abnormalities. There is mild left ventricular hypertrophy.  Left ventricular diastolic parameters  were normal. There is the interventricular septum is flattened in systole  and diastole, consistent with right  ventricular pressure and volume  overload.  2. Coarse trabeculation of the RV apex. Right ventricular systolic  function is mildly reduced. The right ventricular size is severely  enlarged.  3. Right atrial size was severely dilated.  4. The mitral valve is abnormal. Trivial mitral valve regurgitation.  There is mild late systolic prolapse of multiple scallops of the posterior  leaflet of the mitral valve.  5. Mobile vegetations noted on the posterior and anterior TV leaflets  (measures 1.5 x 0.7 cm posterior and up to 2.21 x 0.5 cm on the anterior  leaflet).. The tricuspid valve is abnormal. Tricuspid valve regurgitation  is severe.  6. The aortic valve is tricuspid. Aortic valve regurgitation is not  visualized.  7. The inferior vena cava is dilated in size with <50% respiratory  variability, suggesting right atrial pressure of 15 mmHg.   Echocardiogram 12/14/19: 1. Left ventricular ejection fraction, by estimation, is 60 to 65%. The  left ventricle has normal function. The left ventricle has no regional  wall motion abnormalities. Left ventricular diastolic parameters were  normal.  2. Right ventricular systolic function is mildly reduced. The right  ventricular size is severely enlarged. There is mildly elevated pulmonary  artery systolic pressure.  3. Right atrial size was severely dilated.  4. A small pericardial effusion is present. The pericardial effusion is  posterior to the left ventricle.  5. The mitral valve is normal in structure. Trivial  mitral valve  regurgitation. No evidence of mitral stenosis.  6. Large mobile vegetation on the septal and lateral leaflet with leaflet  destruction and failure to coapt leading to wide open TR. The tricuspid  valve is abnormal. Tricuspid valve regurgitation is severe.  7. The aortic valve is normal in structure. Aortic valve regurgitation is  not visualized. No aortic stenosis is present.  8. The inferior vena cava is dilated in size with <50% respiratory  variability, suggesting right atrial pressure of 15 mmHg.   Laboratory Data:  High Sensitivity Troponin:   Recent Labs  Lab 02/07/20 0623  TROPONINIHS <2     Chemistry Recent Labs  Lab 02/07/20 0623 02/08/20 0415 02/09/20 0509  NA 139 135 136  K 4.4 4.6 4.7  CL 105 104 106  CO2 24 23 24   GLUCOSE 78 95 100*  BUN 10 19 23*  CREATININE 0.99 1.02 1.22  CALCIUM 8.6* 8.2* 8.2*  GFRNONAA >60 >60 >60  ANIONGAP 10 8 6     Recent Labs  Lab 02/07/20 0623 02/09/20 0509  PROT 7.5 5.9*  ALBUMIN 3.2* 2.6*  AST 59* 48*  ALT 51* 47*  ALKPHOS 218* 187*  BILITOT 0.8 0.6   Hematology Recent Labs  Lab 02/07/20 0623 02/08/20 0415 02/09/20 0509  WBC 8.6 7.2 7.1  RBC 3.92* 3.54* 3.77*  HGB 10.4* 9.2* 9.9*  HCT 35.0* 30.5* 32.9*  MCV 89.3 86.2 87.3  MCH 26.5 26.0 26.3  MCHC 29.7* 30.2 30.1  RDW 19.4* 19.0* 19.1*  PLT 212 176 189   BNP Recent Labs  Lab 02/09/20 0509  BNP 395.2*    DDimer No results for input(s): DDIMER in the last 168 hours.   Radiology/Studies:  CT Angio Chest PE W and/or Wo Contrast  Result Date: 02/07/2020 CLINICAL DATA:  Evaluate for pulmonary embolus. EXAM: CT ANGIOGRAPHY CHEST WITH CONTRAST TECHNIQUE: Multidetector CT imaging of the chest was performed using the standard protocol during bolus administration of intravenous contrast. Multiplanar CT image reconstructions and MIPs were obtained to evaluate the vascular anatomy. CONTRAST:  OMNIPAQUE IOHEXOL 350 MG/ML SOLN COMPARISON:  CT  chest 12/22/2019 FINDINGS: Cardiovascular: Satisfactory opacification of the pulmonary arteries to the segmental level. No evidence of pulmonary embolism. Normal heart size. No pericardial effusion. The right atrium and right ventricle appear increased in caliber and there is reflux of contrast material into the IVC and hepatic veins consistent with passive venous congestion due to right heart failure. Mediastinum/Nodes: No enlarged mediastinal, hilar, or axillary lymph nodes. Thyroid gland, trachea, and esophagus demonstrate no significant findings. Lungs/Pleura: No pleural effusion identified. Multifocal bilateral peripheral predominant areas of nodularity, scarring, and architectural distortion identified consistent with sequelae of previous septic emboli. Most of the previously noted nodules demonstrate interval improvement from previous exam: Previous solid subpleural nodule within the lateral right upper lobe now appears cavitary and decreased in size measuring 2.3 x 1.3 cm, image 29/7. Previously this measured 2.4 x 2.1 cm. Nodule within the posterior left upper lobe measures 0.8 x 1.0 cm, image 40/7. Previously 1.6 x 1.4 cm. Previous solid nodule in the superior segment of left lower lobe measuring 1.8 x 1.1 cm, image 90/7 of the previous exam, has resolved in the interval. New cavitary lung nodule within the posterior right lower lobe measures 1.9 cm, image 90/7. Upper Abdomen: No acute abnormality. Musculoskeletal: No chest wall abnormality. No acute or significant osseous findings. Review of the MIP images confirms the above findings. IMPRESSION: 1. No evidence for acute pulmonary embolus. 2. Multifocal bilateral peripheral predominant areas of nodularity, scarring, and architectural distortion consistent with sequelae of previous septic emboli. Most of the previously noted nodules demonstrate interval improvement from previous exam. However, there is a new cavitary lung nodule within the posterior right  lower lobe measuring 1.9 cm. 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 3. Enlarged right atrium and right ventricle with reflux of contrast material into the IVC and hepatic veins consistent with passive venous congestion due to right heart failure. Electronically Signed   By: Signa Kell M.D.   On: 02/07/2020 09:57   DG Chest Port 1 View  Result Date: 02/07/2020 CLINICAL DATA:  Questionable sepsis.  Evaluate for abnormality. EXAM: PORTABLE CHEST 1 VIEW COMPARISON:  01/05/2020 FINDINGS: The heart size appears within normal limits. No pleural effusion identified. Bilateral peripheral predominant scarring with multiple upper lobe predominant cystic lucencies likely representing postinflammatory/infectious pneumatoceles. IMPRESSION: Bilateral peripheral predominant scarring with multiple upper lobe predominant cystic lucencies likely representing postinflammatory/infectious pneumatoceles. Electronically Signed   By: Signa Kell M.D.   On: 02/07/2020 07:13   ECHOCARDIOGRAM COMPLETE  Result Date: 02/07/2020    ECHOCARDIOGRAM REPORT   Patient Name:   Alex [name] Date of Exam: 02/07/2020 Medical Rec #:  [id number]      Height:       71.0 in Accession #:    [id number]     Weight:       136.0 lb Date of Birth:  Aug 23, 1985      BSA:          1.790 m Patient Age:    34 years       BP:           131/96 mmHg Patient Gender: M              HR:           56 bpm. Exam Location:  Inpatient Procedure: 2D Echo Indications:    Endocarditis I38  History:        Patient has prior history of Echocardiogram  examinations, most                 recent 12/14/2019. CHF, Endocarditis; Risk Factors:Polysubstance                 Abuse.  Sonographer:    Thurman Coyer RDCS (AE) Referring Phys: [id number] Jae Dire  Sonographer Comments: Image acquisition challenging due to uncooperative patient. Unable to finish last few images due to poor patient compliance. IMPRESSIONS  1. Left ventricular ejection fraction,  by estimation, is 60 to 65%. The left ventricle has normal function. The left ventricle has no regional wall motion abnormalities. There is mild left ventricular hypertrophy. Left ventricular diastolic parameters were normal. There is the interventricular septum is flattened in systole and diastole, consistent with right ventricular pressure and volume overload.  2. Coarse trabeculation of the RV apex. Right ventricular systolic function is mildly reduced. The right ventricular size is severely enlarged.  3. Right atrial size was severely dilated.  4. The mitral valve is abnormal. Trivial mitral valve regurgitation. There is mild late systolic prolapse of multiple scallops of the posterior leaflet of the mitral valve.  5. Mobile vegetations noted on the posterior and anterior TV leaflets (measures 1.5 x 0.7 cm posterior and up to 2.21 x 0.5 cm on the anterior leaflet).. The tricuspid valve is abnormal. Tricuspid valve regurgitation is severe.  6. The aortic valve is tricuspid. Aortic valve regurgitation is not visualized.  7. The inferior vena cava is dilated in size with <50% respiratory variability, suggesting right atrial pressure of 15 mmHg. Comparison(s): 12/14/2019: LVEF 60-65%, prior vegetations noted on the TV. FINDINGS  Left Ventricle: Left ventricular ejection fraction, by estimation, is 60 to 65%. The left ventricle has normal function. The left ventricle has no regional wall motion abnormalities. The left ventricular internal cavity size was normal in size. There is  mild left ventricular hypertrophy. The interventricular septum is flattened in systole and diastole, consistent with right ventricular pressure and volume overload. Left ventricular diastolic parameters were normal. Right Ventricle: Coarse trabeculation of the RV apex. The right ventricular size is severely enlarged. No increase in right ventricular wall thickness. Right ventricular systolic function is mildly reduced. Left Atrium: Left  atrial size was normal in size. Right Atrium: Right atrial size was severely dilated. Pericardium: There is no evidence of pericardial effusion. Mitral Valve: The mitral valve is abnormal. There is mild late systolic prolapse of multiple scallops of the posterior leaflet of the mitral valve. There is mild thickening of the mitral valve leaflet(s). Trivial mitral valve regurgitation. Tricuspid Valve: Mobile vegetations noted on the posterior and anterior TV leaflets (measures 1.5 x 0.7 cm posterior and up to 2.21 x 0.5 cm on the anterior leaflet). The tricuspid valve is abnormal. Tricuspid valve regurgitation is severe. The flow in the hepatic veins is reversed during ventricular systole. Aortic Valve: The aortic valve is tricuspid. Aortic valve regurgitation is not visualized. Pulmonic Valve: The pulmonic valve was grossly normal. Pulmonic valve regurgitation is trivial. Aorta: The aortic root and ascending aorta are structurally normal, with no evidence of dilitation. Venous: The inferior vena cava is dilated in size with less than 50% respiratory variability, suggesting right atrial pressure of 15 mmHg. IAS/Shunts: There is left bowing of the interatrial septum, suggestive of elevated right atrial pressure. No atrial level shunt detected by color flow Doppler.  LEFT VENTRICLE PLAX 2D LVIDd:         4.40 cm  Diastology LVIDs:  3.10 cm  LV e' medial:    13.10 cm/s LV PW:         1.10 cm  LV E/e' medial:  4.4 LV IVS:        1.30 cm  LV e' lateral:   16.30 cm/s LVOT diam:     2.10 cm  LV E/e' lateral: 3.5 LV SV:         39 LV SV Index:   22 LVOT Area:     3.46 cm  LEFT ATRIUM           Index       RIGHT ATRIUM           Index LA diam:      3.90 cm 2.18 cm/m  RA Area:     33.30 cm LA Vol (A4C): 33.5 ml 18.72 ml/m RA Volume:   125.00 ml 69.84 ml/m  AORTIC VALVE LVOT Vmax:   59.40 cm/s LVOT Vmean:  38.700 cm/s LVOT VTI:    0.112 m  AORTA Ao Root diam: 2.90 cm MITRAL VALVE MV Area (PHT): 2.76 cm    SHUNTS MV  Decel Time: 275 msec    Systemic VTI:  0.11 m MV E velocity: 57.00 cm/s  Systemic Diam: 2.10 cm MV A velocity: 37.30 cm/s MV E/A ratio:  1.53 Zoila Shutter MD Electronically signed by Zoila Shutter MD Signature Date/Time: 02/07/2020/4:28:01 PM    Final      Assessment and Plan:   1. 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 Completed a 2 week course of linezolid a few days prior to admission. Now with diffuse LE rash and evidence of new cavitary lesion RLL lesion c/f recurrent septic emboli. Echo this admission with ongoing mobile vegetation on TV with severe TR. He has continued to use drugs (heroin, amphetamines, and marijuana), though reports avoidance of IV drug use since discharge earlier this month. CT Surgery is aware, though likely he is still not a candidate for intervention given ongoing drug use.  - Unfortunate situation with ongoing drug use - unlikely to be a candidate for surgical intervention until he proves abstinence from drug use.  - Continue antibiotics per ID  2. Acute on chronic right-sided heart failure: echo this admission with ongoing mild RV systolic dysfunction with severe RAE 2/2 severe TR in the setting of TV endocarditis. BNP 395 this admission. CTA Chest with reflux of contrast into the IVC and hepatic veins c/w passive venous congestion. Lasix was added to his spironolactone this admission for improved volume management. He appears euvolemic at this time - Continue spironolactone and lasix - We discussed the importance of limiting sodium to 2g/day and fluid to 2L/day  3. Polysubstance abuse: the crux of his issues. Still with ongoing heroin, amphetamine, and marijuana use, though he reports no IV drug use since discharge. Has declined suboxone. Would benefit from rehab - Continue to provide resources/support for cessation of drug use.     For questions or updates, please contact [facility] Please consult [email or web address] for contact info under    Signed, Beatriz Stallion, PA-C  02/09/2020 9:17 AM

## 2020-02-09 NOTE — Progress Notes (Addendum)
Spoke with Haywood Lasso from MRI at [facility], [phone], MRI under general anesthesia scheduled for 0800, pt to arrive at [facility] Stay @ 0630, [facility]  has been scheduled to transport patient to Short Stay--- to complete MRI under anesthesia. Informed consent completed.  Pt is to be NPO after midnight. Pt aware and MD updated. SRP, RN

## 2020-02-09 NOTE — Progress Notes (Signed)
ANTICOAGULATION CONSULT NOTE - Initial Consult  Pharmacy Consult for Lovenox Indication: VTE prophylaxis  No Active Allergies  Patient Measurements: Height: 5\' 11"  (180.3 cm) Weight: 61.7 kg (136 lb) IBW/kg (Calculated) : 75.3 Heparin Dosing Weight:   Vital Signs: Temp: 98.4 F (36.9 C) (11/30 0352) Temp Source: Oral (11/30 0352) BP: 133/105 (11/30 0352) Pulse Rate: 64 (11/30 0352)  Labs: Recent Labs    02/07/20 0623 02/07/20 0623 02/07/20 0711 02/08/20 0415 02/09/20 0509  HGB 10.4*   < >  --  9.2* 9.9*  HCT 35.0*  --   --  30.5* 32.9*  PLT 212  --   --  176 189  APTT  --   --  38*  --   --   LABPROT  --   --  16.4* 15.7*  --   INR  --   --  1.4* 1.3*  --   CREATININE 0.99  --   --  1.02 1.22  TROPONINIHS <2  --   --   --   --    < > = values in this interval not displayed.    Estimated Creatinine Clearance: 74.5 mL/min (by C-G formula based on SCr of 1.22 mg/dL).   Medical History: Past Medical History:  Diagnosis Date  . ADHD (attention deficit hyperactivity disorder)   . Allergy   . Asthma   . CHF (congestive heart failure) (HCC)   . Depressed   . 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 ETOH abuse   . History of suicidal tendencies   . Polysubstance abuse (HCC)    Plan:  Start LMWH 40 qday at 12 noon today (last dose SQH at 05am)  Pharmacy to sign off  01/2019, Pharm.D 02/09/2020 9:23 AM

## 2020-02-10 ENCOUNTER — Inpatient Hospital Stay (HOSPITAL_COMMUNITY): Payer: Self-pay | Admitting: Certified Registered"

## 2020-02-10 ENCOUNTER — Encounter (HOSPITAL_COMMUNITY): Admission: EM | Payer: Self-pay | Attending: Internal Medicine

## 2020-02-10 ENCOUNTER — Inpatient Hospital Stay: Payer: Self-pay | Admitting: Internal Medicine

## 2020-02-10 ENCOUNTER — Ambulatory Visit (HOSPITAL_COMMUNITY)
Admit: 2020-02-10 | Discharge: 2020-02-10 | Disposition: A | Discharge status: Home / Self Care | Payer: Self-pay | Attending: Family Medicine | Admitting: Family Medicine

## 2020-02-10 ENCOUNTER — Ambulatory Visit (HOSPITAL_COMMUNITY): Admit: 2020-02-10 | Payer: Self-pay

## 2020-02-10 DIAGNOSIS — I5033 Acute on chronic diastolic (congestive) heart failure: Secondary | ICD-10-CM

## 2020-02-10 HISTORY — PX: RADIOLOGY WITH ANESTHESIA: SHX6223

## 2020-02-10 LAB — MAGNESIUM: Magnesium: 1.6 mg/dL — ABNORMAL LOW (ref 1.7–2.4)

## 2020-02-10 LAB — CBC WITH DIFFERENTIAL/PLATELET
Abs Immature Granulocytes: 0.02 10*3/uL (ref 0.00–0.07)
Basophils Absolute: 0 10*3/uL (ref 0.0–0.1)
Basophils Relative: 1 %
Eosinophils Absolute: 0.2 10*3/uL (ref 0.0–0.5)
Eosinophils Relative: 3 %
HCT: 33.7 % — ABNORMAL LOW (ref 39.0–52.0)
Hemoglobin: 10.1 g/dL — ABNORMAL LOW (ref 13.0–17.0)
Immature Granulocytes: 0 %
Lymphocytes Relative: 24 %
Lymphs Abs: 1.9 10*3/uL (ref 0.7–4.0)
MCH: 26 pg (ref 26.0–34.0)
MCHC: 30 g/dL (ref 30.0–36.0)
MCV: 86.6 fL (ref 80.0–100.0)
Monocytes Absolute: 0.4 10*3/uL (ref 0.1–1.0)
Monocytes Relative: 6 %
Neutro Abs: 5.2 10*3/uL (ref 1.7–7.7)
Neutrophils Relative %: 66 %
Platelets: 197 10*3/uL (ref 150–400)
RBC: 3.89 MIL/uL — ABNORMAL LOW (ref 4.22–5.81)
RDW: 19.1 % — ABNORMAL HIGH (ref 11.5–15.5)
WBC: 7.8 10*3/uL (ref 4.0–10.5)
nRBC: 0 % (ref 0.0–0.2)

## 2020-02-10 LAB — COMPREHENSIVE METABOLIC PANEL
ALT: 51 U/L — ABNORMAL HIGH (ref 0–44)
AST: 44 U/L — ABNORMAL HIGH (ref 15–41)
Albumin: 2.7 g/dL — ABNORMAL LOW (ref 3.5–5.0)
Alkaline Phosphatase: 191 U/L — ABNORMAL HIGH (ref 38–126)
Anion gap: 9 (ref 5–15)
BUN: 19 mg/dL (ref 6–20)
CO2: 24 mmol/L (ref 22–32)
Calcium: 8.2 mg/dL — ABNORMAL LOW (ref 8.9–10.3)
Chloride: 103 mmol/L (ref 98–111)
Creatinine, Ser: 1.24 mg/dL (ref 0.61–1.24)
GFR, Estimated: >60 mL/min (ref >60)
Glucose, Bld: 86 mg/dL (ref 70–99)
Potassium: 4.5 mmol/L (ref 3.5–5.1)
Sodium: 136 mmol/L (ref 135–145)
Total Bilirubin: 0.7 mg/dL (ref 0.3–1.2)
Total Protein: 6.2 g/dL — ABNORMAL LOW (ref 6.5–8.1)

## 2020-02-10 LAB — PHOSPHORUS: Phosphorus: 4.4 mg/dL (ref 2.5–4.6)

## 2020-02-10 LAB — VANCOMYCIN, TROUGH: Vancomycin Tr: 25 ug/mL (ref 15–20)

## 2020-02-10 SURGERY — MRI WITH ANESTHESIA
Anesthesia: General

## 2020-02-10 MED ORDER — DEXAMETHASONE SODIUM PHOSPHATE 10 MG/ML IJ SOLN
INTRAMUSCULAR | Status: DC | PRN
  Administered 2020-02-10: 10 mg via INTRAVENOUS

## 2020-02-10 MED ORDER — LIDOCAINE 2% (20 MG/ML) 5 ML SYRINGE
INTRAMUSCULAR | Status: DC | PRN
  Administered 2020-02-10: 60 mg via INTRAVENOUS

## 2020-02-10 MED ORDER — PROPOFOL 10 MG/ML IV BOLUS
INTRAVENOUS | Status: DC | PRN
  Administered 2020-02-10: 200 mg via INTRAVENOUS

## 2020-02-10 MED ORDER — SUGAMMADEX SODIUM 200 MG/2ML IV SOLN
INTRAVENOUS | Status: DC | PRN
  Administered 2020-02-10: 200 mg via INTRAVENOUS

## 2020-02-10 MED ORDER — MIDAZOLAM HCL 5 MG/5ML IJ SOLN
INTRAMUSCULAR | Status: DC | PRN
  Administered 2020-02-10: 2 mg via INTRAVENOUS

## 2020-02-10 MED ORDER — KETOROLAC TROMETHAMINE 15 MG/ML IJ SOLN
INTRAMUSCULAR | Status: AC
  Filled 2020-02-10: qty 1

## 2020-02-10 MED ORDER — PHENYLEPHRINE HCL-NACL 10-0.9 MG/250ML-% IV SOLN
INTRAVENOUS | Status: DC | PRN
  Administered 2020-02-10: 20 ug/min via INTRAVENOUS

## 2020-02-10 MED ORDER — GADOBUTROL 1 MMOL/ML IV SOLN
6.0000 mL | Freq: Once | INTRAVENOUS | Status: AC | PRN
  Administered 2020-02-10: 6 mL via INTRAVENOUS

## 2020-02-10 MED ORDER — ONDANSETRON HCL 4 MG/2ML IJ SOLN
INTRAMUSCULAR | Status: DC | PRN
  Administered 2020-02-10: 4 mg via INTRAVENOUS

## 2020-02-10 MED ORDER — ROCURONIUM BROMIDE 10 MG/ML (PF) SYRINGE
PREFILLED_SYRINGE | INTRAVENOUS | Status: DC | PRN
  Administered 2020-02-10: 50 mg via INTRAVENOUS
  Administered 2020-02-10: 30 mg via INTRAVENOUS

## 2020-02-10 MED ORDER — LACTATED RINGERS IV SOLN: INTRAVENOUS | Status: DC | PRN

## 2020-02-10 MED ORDER — FENTANYL CITRATE (PF) 250 MCG/5ML IJ SOLN
INTRAMUSCULAR | Status: DC | PRN
  Administered 2020-02-10 (×2): 50 ug via INTRAVENOUS

## 2020-02-10 MED ORDER — VANCOMYCIN HCL 750 MG/150ML IV SOLN
750.0000 mg | Freq: Two times a day (BID) | INTRAVENOUS | Status: DC
  Administered 2020-02-10 – 2020-02-11 (×2): 750 mg via INTRAVENOUS
  Filled 2020-02-10 (×4): qty 150

## 2020-02-10 NOTE — Progress Notes (Signed)
Received pt from PACU at [facility] from MRI throrax and back area under local anesthersia, to Rm 1426. Pt in stable condition, VS noted. Pt requested pain med. No other acute changes note at this time. Will cont monitor. SRP, RN

## 2020-02-10 NOTE — Anesthesia Procedure Notes (Signed)
Procedure Name: Intubation Date/Time: 02/10/2020 12:43 PM Performed by: Griffin Dakin, CRNA Pre-anesthesia Checklist: Patient identified, Emergency Drugs available, Suction available and Patient being monitored Patient Re-evaluated:Patient Re-evaluated prior to induction Oxygen Delivery Method: Circle system utilized Preoxygenation: Pre-oxygenation with 100% oxygen Induction Type: IV induction Ventilation: Mask ventilation without difficulty Laryngoscope Size: Mac and 4 Grade View: Grade I Tube type: Oral Tube size: 7.5 mm Number of attempts: 1 Airway Equipment and Method: Stylet Placement Confirmation: ETT inserted through vocal cords under direct vision,  positive ETCO2 and breath sounds checked- equal and bilateral Secured at: 21 cm Tube secured with: Tape Dental Injury: Teeth and Oropharynx as per pre-operative assessment

## 2020-02-10 NOTE — Plan of Care (Signed)
  Problem: Nutrition: Goal: Adequate nutrition will be maintained 02/10/2020 1849 by Charmian Muff, RN Outcome: Progressing 02/10/2020 1811 by Charmian Muff, RN Outcome: Progressing   Problem: Pain Managment: Goal: General experience of comfort will improve 02/10/2020 1849 by Charmian Muff, RN Outcome: Progressing 02/10/2020 1811 by Charmian Muff, RN Outcome: Progressing

## 2020-02-10 NOTE — Progress Notes (Signed)
Pharmacy Antibiotic Note  Alex [name] is a 34 y.o. male with a h/o IVDU and endocarditis with recent extended hospitalization for the same. During the last admission, he was treated with daptomycin and discharged with linezolid. Patient is re-admitted on 02/07/2020 with chest pain and rash. CT chest revealed new septic emboli which may be consistent with recurrent endocarditis per EDP. Patient received daptomycin x 1 this AM. Patient has a vancomycin allergy listed from previous admission related to a LE rash that persists to this hospitalization. Seems less likely to be a drug reaction than part of the disease process especially since it persists. Plan is to retrial with vancomycin which would cover if cavitary lesion on CT is PNA and reveal if vancomycin is true allergy which may be valuable information in the future.   Vancomycin trough resulted at 25, drawn about 1.5 hours early based on last dose. True trough closer to 23. Patient half-life calculated at ~10.5 hours so will continue his current schedule of q12h dosing but lower the dose. His scr is stable at 1.24 today but still above his baseline so will continue to monitor closely. Vancomycin 750mg  IV q12h gives a predicted trough of 17.  Plan: Stop vancomycin 1000mg  IV q12h Start vancomycin 750 mg IV q12h  Will follow renal function closely with daily SCr and check a vanc level at steady-state depending on plans for antibiotics   Instructed RN to monitor for rash or signs of allergic reaction.  Will update allergy information if patient tolerates loading dose.    Height: 5\' 11"  (180.3 cm) Weight: 66.8 kg (147 lb 4.8 oz) IBW/kg (Calculated) : 75.3  Temp (24hrs), Avg:98.3 F (36.8 C), Min:97.9 F (36.6 C), Max:98.7 F (37.1 C)  Recent Labs  Lab 02/07/20 0623 02/08/20 0415 02/09/20 0509 02/10/20 0347  WBC 8.6 7.2 7.1 7.8  CREATININE 0.99 1.02 1.22 1.24  LATICACIDVEN 1.5  2.0*  --   --   --   VANCOTROUGH  --   --   --  25*     Estimated Creatinine Clearance: 79.3 mL/min (by C-G formula based on SCr of 1.24 mg/dL).    No Active Allergies  Antimicrobials this admission: 11/28 daptomycin x 1 11/28 vancomyin >>  Dose adjustments this admission: Vancomycin 1000mg  IV q12h VT: 23 >> vancomycin 750mg  IV q12h  Microbiology results: 11/28 BCx:  11/28 UCx:   11/28 Resp PCR: neg/neg  Thank you for allowing pharmacy to be a part of this patient's care.  02/10/2020 9:15 AM

## 2020-02-10 NOTE — Progress Notes (Signed)
Pt at [facility] under general anesthesia for review of thorax and lumbar areas r/t to ongoing pain. Received report from off going RN pt left for MR with [facility] approx. 0109. Waiting for pt return. SRP, RN

## 2020-02-10 NOTE — Transfer of Care (Addendum)
Immediate Anesthesia Transfer of Care Note  Patient: Alex [name]  Procedure(s) Performed: MRI WITH ANESTHESIA OF THORACIC SPINE WITH AND WIHTOUT AND LUMBAR WITH AND WITHOUT CONTRAST (N/A )  Patient Location: PACU  Anesthesia Type:General  Level of Consciousness: awake, alert  and oriented  Airway & Oxygen Therapy: Patient Spontanous Breathing  Post-op Assessment: Report given to RN and Post -op Vital signs reviewed and stable  Post vital signs: Reviewed and stable  Last Vitals:  Vitals Value Taken Time  BP    Temp    Pulse 76 02/10/20 1537  Resp 27 02/10/20 1537  SpO2 91% 02/10/20 1537  Vitals shown include unvalidated device data.  Last Pain:  Vitals:   02/10/20 1051  TempSrc:   PainSc: 7       Patients Stated Pain Goal: 4 (02/10/20 1051)  Complications: No complications documented.

## 2020-02-10 NOTE — Anesthesia Preprocedure Evaluation (Signed)
Anesthesia Evaluation  Patient identified by MRN, date of birth, ID band Patient awake    Reviewed: Allergy & Precautions, NPO status   Airway Mallampati: II  TM Distance: >3 FB     Dental   Pulmonary asthma , pneumonia, Current Smoker and Patient abstained from smoking.,    breath sounds clear to auscultation       Cardiovascular +CHF   Rhythm:Regular Rate:Normal     Neuro/Psych Anxiety Depression  Neuromuscular disease    GI/Hepatic negative GI ROS, (+) Hepatitis -  Endo/Other    Renal/GU negative Renal ROS     Musculoskeletal   Abdominal   Peds  Hematology  (+) anemia ,   Anesthesia Other Findings   Reproductive/Obstetrics                             Anesthesia Physical Anesthesia Plan  ASA: III  Anesthesia Plan: General   Post-op Pain Management:    Induction: Intravenous  PONV Risk Score and Plan: 1 and Ondansetron, Dexamethasone and Midazolam  Airway Management Planned: LMA  Additional Equipment:   Intra-op Plan:   Post-operative Plan:   Informed Consent: I have reviewed the patients History and Physical, chart, labs and discussed the procedure including the risks, benefits and alternatives for the proposed anesthesia with the patient or authorized representative who has indicated his/her understanding and acceptance.     Dental advisory given  Plan Discussed with: CRNA and Anesthesiologist  Anesthesia Plan Comments:         Anesthesia Quick Evaluation

## 2020-02-10 NOTE — Progress Notes (Addendum)
Patient ID: Alex [name], male   DOB: Sep 19, 1985, 34 y.o.   MRN: [id number]  PROGRESS NOTE    Alex [name]  UYQ:[id number] DOB: Sep 19, 1985 DOA: 02/07/2020 PCP: Patient, No Pcp Per   Brief Narrative:  34 year old male with past medical history of IV drug abuse, alcohol abuse, tricuspid valve MRSA endocarditis, HCV, recurrent hospitalizations for endocarditis who presented to the ED in police custody for concerns of chest pain and bilateral lower extremity rash which has been going on for several days. Patient recently had a hospitalization from 10/4-11/9 for TV endocarditis with pulmonary septic emboli and paraspinal myositis. He was initially treated with vancomycin and then switched to daptomycin due to a rash and discharged on linezolid for 2 weeks. States that he finished his antibiotics a few days ago and then had his presenting symptoms soon after. Denies any recent IV drug use but has been using amphetamines and marijuana in the past several days. He was seen by CT surgery during his last hospitalization and was not deemed a candidate for TV repair or Angiomax.  On presentation, patient was hemodynamically stable. CXR: Bilateral predominant scarring with multiple upper lobe predominant cystic lucencies likely representing postinflammatory/infectious pneumatoceles. CTA chest: No acute PE, consistent with multifocal septic emboli most of the previously noted nodules demonstrate interval improvement however there is a new cavitary lung nodule in the posterior right lower lobe measuring 1.9 cm concerning for recurrent septic emboli, enlarged RA and RV with reflux of contrast into the IVC and hepatic veins consistent with passive venous congestion due to right heart failure.  Patient was started on IV antibiotics.  Subsequently ID, cardiothoracic surgery and cardiology were consulted.  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 Cliffton AstersLightfoot appreciated: Recommended continuing medical management with IV antibiotics and currently does not need surgical intervention -ID following: Continue vancomycin for now.  MRI of thoracolumbar spine was negative for osteomyelitis/abscesses.  ID recommended punch biopsy of skin lesions.  General surgery has been consulted: patient not decided yet -Blood cultures negative so far -Echo with mobile vegetations on the posterior and anterior TV leaflets, severe TV regurgitation   Acute on chronic diastolic heart failure -Echo with EF 60-65%, RV systolic function mildly reduced, flattening of the IV septum -Strict input and output.  Daily weights.  Fluid restriction.  Continue Lasix and spironolactone.  Cardiology has signed off and recommend outpatient follow-up  Rash -Possibly drug rash.  ID recommended biopsy of the rash for which neurosurgery has been consulted.  He is resistant to skin biopsy: still not decided.  Polysubstance abuse/history of IVDU -Denies recent IVDU, but admits to recent marijuana, meth, heroin use -Strongly encouraged cessation -Prior hospitalist discussed and recommended suboxone, he's declined this  Hepatitis C -Outpatient follow-up with ID  Chronic normocytic anemia -Stable  DVT prophylaxis: Lovenox  code Status: Full Family Communication: None at bedside Disposition Plan: Status is: Inpatient  Remains inpatient appropriate because:Inpatient level of care appropriate due to severity of illness   Dispo: The patient is from: Home              Anticipated d/c is to: Home.  Apparently patient will be discharged to prison once medically  stable              Anticipated d/c date is: 2 days              Patient currently is not medically stable to d/c.  Consultants: ID/CT surgery/cardiology/general surgery  Procedures:  Echo IMPRESSIONS    1. Left ventricular ejection fraction, by estimation, is 60 to 65%. The  left ventricle has normal function. The left ventricle has no regional  wall motion abnormalities. There is mild left ventricular hypertrophy.  Left ventricular diastolic parameters  were normal. There is the interventricular septum is flattened in systole  and diastole, consistent with right ventricular pressure and volume  overload.  2. Coarse trabeculation of the RV apex. Right ventricular systolic  function is mildly reduced. The right ventricular size is severely  enlarged.  3. Right atrial size was severely dilated.  4. The mitral valve is abnormal. Trivial mitral valve regurgitation.  There is mild late systolic prolapse of multiple scallops of the posterior  leaflet of the mitral valve.  5. Mobile vegetations noted on the posterior and anterior TV leaflets  (measures 1.5 x 0.7 cm posterior and up to 2.21 x 0.5 cm on the anterior  leaflet).. The tricuspid valve is abnormal. Tricuspid valve regurgitation  is severe.  6. The aortic valve is tricuspid. Aortic valve regurgitation is not  visualized.  7. The inferior vena cava is dilated in size with <50% respiratory  variability, suggesting right atrial pressure of 15 mmHg.   Comparison(s): 12/14/2019: LVEF 60-65%, prior vegetations noted on the TV.   Antimicrobials:  Anti-infectives (From admission, onward)   Start     Dose/Rate Route Frequency Ordered Stop   02/10/20 1700  vancomycin (VANCOREADY) IVPB 750 mg/150 mL        750 mg 150 mL/hr over 60 Minutes Intravenous Every 12 hours 02/10/20 0922     02/08/20 0500  vancomycin (VANCOCIN) IVPB 1000 mg/200 mL premix  Status:  Discontinued        1,000 mg 200 mL/hr over 60 Minutes Intravenous  Every 12 hours 02/07/20 1631 02/10/20 0922   02/07/20 1730  vancomycin (VANCOREADY) IVPB 1500 mg/300 mL        1,500 mg 150 mL/hr over 120 Minutes Intravenous  Once 02/07/20 1631 02/07/20 2055   02/07/20 0830  DAPTOmycin (CUBICIN) 500 mg in sodium chloride 0.9 % IVPB        500 mg 220 mL/hr over 30 Minutes Intravenous  Once 02/07/20 0712 02/07/20 0913   02/07/20 0630  DAPTOmycin (CUBICIN) 500 mg in sodium chloride 0.9 % IVPB  Status:  Discontinued        500 mg 220 mL/hr over 30 Minutes Intravenous  Once 02/07/20 0612 02/07/20 0712   02/07/20 0615  ceFEPIme (MAXIPIME) 2 g in sodium chloride 0.9 % 100 mL IVPB        2 g 200 mL/hr over 30 Minutes Intravenous STAT 02/07/20 0610 02/07/20 0722       Subjective: Patient seen and examined at bedside.  Still complains of back pain.  Denies worsening fever, nausea or vomiting.  Feels weak. Objective: Vitals:   02/09/20 0352 02/09/20 1403 02/09/20 2111 02/10/20 0527  BP: (!) 133/105 111/86 114/84 119/89  Pulse: 64 71 74 73  Resp: 16 18 14 14   Temp: 98.4 F (36.9 C) 97.9 F (36.6 C) 98.3 F (36.8 C) 98.7 F (37.1 C)  TempSrc: Oral Oral Oral Oral  SpO2: 98% 98% 97% 96%  Weight:  66.8 kg  Height:        Intake/Output Summary (Last 24 hours) at 02/10/2020 1241 Last data filed at 02/10/2020 0526 Gross per 24 hour  Intake --  Output 875 ml  Net -875 ml   Filed Weights   02/07/20 1323 02/10/20 0527  Weight: 61.7 kg 66.8 kg    Examination:  General exam: Poor historian.  Looks chronically ill.  No distress. Respiratory system: Decreased breath sounds at bases bilaterally.  No wheezing Cardiovascular system: Rate controlled, S1-S2 heard Gastrointestinal system: Abdomen is nondistended, soft and nontender.  Bowel sounds are heard  extremities: Trace lower extremity edema.  No cyanosis or clubbing.   Central nervous system: Awake and alert.  No focal neurological deficits.  Moves extremities.   Skin: Bilateral lower extremity rash  still present Psychiatry: Affect is flat.    Data Reviewed: I have personally reviewed following labs and imaging studies  CBC: Recent Labs  Lab 02/07/20 0623 02/08/20 0415 02/09/20 0509 02/10/20 0347  WBC 8.6 7.2 7.1 7.8  NEUTROABS 6.0  --   --  5.2  HGB 10.4* 9.2* 9.9* 10.1*  HCT 35.0* 30.5* 32.9* 33.7*  MCV 89.3 86.2 87.3 86.6  PLT 212 176 189 197   Basic Metabolic Panel: Recent Labs  Lab 02/07/20 0623 02/08/20 0415 02/09/20 0509 02/10/20 0347  NA 139 135 136 136  K 4.4 4.6 4.7 4.5  CL 105 104 106 103  CO2 24 23 24 24   GLUCOSE 78 95 100* 86  BUN 10 19 23* 19  CREATININE 0.99 1.02 1.22 1.24  CALCIUM 8.6* 8.2* 8.2* 8.2*  MG  --   --  1.7 1.6*  PHOS  --   --  3.9 4.4   GFR: Estimated Creatinine Clearance: 79.3 mL/min (by C-G formula based on SCr of 1.24 mg/dL). Liver Function Tests: Recent Labs  Lab 02/07/20 0623 02/09/20 0509 02/10/20 0347  AST 59* 48* 44*  ALT 51* 47* 51*  ALKPHOS 218* 187* 191*  BILITOT 0.8 0.6 0.7  PROT 7.5 5.9* 6.2*  ALBUMIN 3.2* 2.6* 2.7*   No results for input(s): LIPASE, AMYLASE in the last 168 hours. No results for input(s): AMMONIA in the last 168 hours. Coagulation Profile: Recent Labs  Lab 02/07/20 0711 02/08/20 0415  INR 1.4* 1.3*   Cardiac Enzymes: No results for input(s): CKTOTAL, CKMB, CKMBINDEX, TROPONINI in the last 168 hours. BNP (last 3 results) No results for input(s): PROBNP in the last 8760 hours. HbA1C: No results for input(s): HGBA1C in the last 72 hours. CBG: No results for input(s): GLUCAP in the last 168 hours. Lipid Profile: No results for input(s): CHOL, HDL, LDLCALC, TRIG, CHOLHDL, LDLDIRECT in the last 72 hours. Thyroid Function Tests: No results for input(s): TSH, T4TOTAL, FREET4, T3FREE, THYROIDAB in the last 72 hours. Anemia Panel: No results for input(s): VITAMINB12, FOLATE, FERRITIN, TIBC, IRON, RETICCTPCT in the last 72 hours. Sepsis Labs: Recent Labs  Lab 02/07/20 02/09/20   LATICACIDVEN 1.5  2.0*    Recent Results (from the past 240 hour(s))  Blood Culture (routine x 2)     Status: None (Preliminary result)   Collection Time: 02/07/20  6:23 AM   Specimen: BLOOD RIGHT HAND  Result Value Ref Range Status   Specimen Description   Final    BLOOD RIGHT HAND Performed at [facility], [address], [place], [place]    Special Requests   Final    BOTTLES DRAWN AEROBIC AND ANAEROBIC Blood Culture results  may not be optimal due to an inadequate volume of blood received in culture bottles Performed at [facility], [address] [place], [place] [address]    Culture   Final    NO GROWTH 3 DAYS Performed at [facility], [address], [place], [place] [address]    Report Status PENDING  Incomplete  Blood Culture (routine x 2)     Status: None (Preliminary result)   Collection Time: 02/07/20  6:23 AM   Specimen: BLOOD RIGHT FOREARM  Result Value Ref Range Status   Specimen Description   Final    BLOOD RIGHT FOREARM Performed at [facility], [address] [place], [place] [address]    Special Requests   Final    BOTTLES DRAWN AEROBIC AND ANAEROBIC Blood Culture adequate volume Performed at [facility], [address], [place], [place] [address]    Culture   Final    NO GROWTH 3 DAYS Performed at [facility], [address] [place], [place] [address]    Report Status PENDING  Incomplete  Urine culture     Status: None   Collection Time: 02/07/20  6:23 AM   Specimen: In/Out Cath Urine  Result Value Ref Range Status   Specimen Description   Final    IN/OUT CATH URINE Performed at [facility], [address] [place], [place] [address]    Special Requests   Final    URINE, RANDOM Performed at [facility], [address] [place], [place] [address]    Culture   Final    NO GROWTH Performed at [facility], [address], [place], [place] [address]    Report Status 02/08/2020 FINAL  Final  Resp Panel by RT-PCR (Flu A&B, Covid) Nasopharyngeal Swab     Status: None   Collection Time: 02/07/20  6:23 AM   Specimen: Nasopharyngeal Swab; Nasopharyngeal(NP) swabs in vial transport medium  Result Value Ref Range Status   SARS Coronavirus 2 by RT PCR NEGATIVE NEGATIVE Final    Comment: (NOTE) SARS-CoV-2 target nucleic acids are NOT DETECTED.  The SARS-CoV-2 RNA is generally detectable in upper respiratory specimens during the acute phase of infection. The lowest concentration of SARS-CoV-2 viral copies this assay can detect is 138 copies/mL. A negative result does not preclude SARS-Cov-2 infection and should not be used as the sole basis for treatment or other patient management decisions. A negative result may occur with  improper specimen collection/handling, submission of specimen other than nasopharyngeal swab, presence of viral mutation(s) within the areas targeted by this assay, and inadequate number of viral copies(<138 copies/mL). A negative result must be combined with clinical observations, patient history, and epidemiological information. The expected result is Negative.  Fact Sheet for Patients:  BloggerCourse.com  Fact Sheet for Healthcare Providers:  SeriousBroker.it  This test is no t yet approved or cleared by the [place] FDA and  has been authorized for detection and/or diagnosis of SARS-CoV-2 by FDA under an Emergency Use Authorization (EUA). This EUA will remain  in effect (meaning this test can be used) for the duration of the COVID-19 declaration under Section 564(b)(1) of the Act, 21 U.S.C.section 360bbb-3(b)(1), unless the authorization is terminated  or revoked sooner.       Influenza A by PCR NEGATIVE NEGATIVE Final   Influenza B by PCR NEGATIVE NEGATIVE Final    Comment: (NOTE) The Xpert  Xpress SARS-CoV-2/FLU/RSV plus  assay is intended as an aid in the diagnosis of influenza from Nasopharyngeal swab specimens and should not be used as a sole basis for treatment. Nasal washings and aspirates are unacceptable for Xpert Xpress SARS-CoV-2/FLU/RSV testing.  Fact Sheet for Patients: BloggerCourse.com  Fact Sheet for Healthcare Providers: SeriousBroker.it  This test is not yet approved or cleared by the [place] FDA and has been authorized for detection and/or diagnosis of SARS-CoV-2 by FDA under an Emergency Use Authorization (EUA). This EUA will remain in effect (meaning this test can be used) for the duration of the COVID-19 declaration under Section 564(b)(1) of the Act, 21 U.S.C. section 360bbb-3(b)(1), unless the authorization is terminated or revoked.  Performed at [facility], [address], [place], [place] [address]          Radiology Studies: No results found.      Scheduled Meds: . [MAR Hold] enoxaparin (LOVENOX) injection  40 mg Subcutaneous Daily  . [MAR Hold] furosemide  20 mg Oral Daily  . lidocaine  1 patch Transdermal Q24H  . [MAR Hold] lidocaine-EPINEPHrine  30 mL Intradermal Once  . [MAR Hold] pantoprazole  40 mg Oral Daily  . [MAR Hold] sodium chloride flush  3 mL Intravenous Q12H  . [MAR Hold] spironolactone  12.5 mg Oral Daily   Continuous Infusions: . vancomycin            Glade Lloyd, MD [facility] 02/10/2020, 12:41 PM

## 2020-02-10 NOTE — Anesthesia Postprocedure Evaluation (Signed)
Anesthesia Post Note  Patient: Alex [name]  Procedure(s) Performed: MRI WITH ANESTHESIA OF THORACIC SPINE WITH AND WIHTOUT AND LUMBAR WITH AND WITHOUT CONTRAST (N/A )     Patient location during evaluation: PACU Anesthesia Type: General Level of consciousness: awake and alert Pain management: pain level controlled Vital Signs Assessment: post-procedure vital signs reviewed and stable Respiratory status: spontaneous breathing, nonlabored ventilation, respiratory function stable and patient connected to nasal cannula oxygen Cardiovascular status: blood pressure returned to baseline and stable Postop Assessment: no apparent nausea or vomiting Anesthetic complications: no   No complications documented.  Last Vitals:  Vitals:   02/10/20 1611 02/10/20 1641  BP: 115/83 (!) 117/93  Pulse: 65 72  Resp: (!) 27 16  Temp: 36.6 C 36.7 C  SpO2: 96% 98%    Last Pain:  Vitals:   02/10/20 1641  TempSrc: Oral  PainSc:                  Anesa Fronek COKER

## 2020-02-11 ENCOUNTER — Encounter (HOSPITAL_COMMUNITY): Payer: Self-pay | Admitting: Radiology

## 2020-02-11 LAB — MAGNESIUM: Magnesium: 1.6 mg/dL — ABNORMAL LOW (ref 1.7–2.4)

## 2020-02-11 LAB — C-REACTIVE PROTEIN: CRP: 1.4 mg/dL — ABNORMAL HIGH (ref <1.0)

## 2020-02-11 LAB — CBC
HCT: 32.8 % — ABNORMAL LOW (ref 39.0–52.0)
Hemoglobin: 9.9 g/dL — ABNORMAL LOW (ref 13.0–17.0)
MCH: 26.1 pg (ref 26.0–34.0)
MCHC: 30.2 g/dL (ref 30.0–36.0)
MCV: 86.5 fL (ref 80.0–100.0)
Platelets: 185 10*3/uL (ref 150–400)
RBC: 3.79 MIL/uL — ABNORMAL LOW (ref 4.22–5.81)
RDW: 18.7 % — ABNORMAL HIGH (ref 11.5–15.5)
WBC: 7.7 10*3/uL (ref 4.0–10.5)
nRBC: 0 % (ref 0.0–0.2)

## 2020-02-11 LAB — POTASSIUM: Potassium: 5.5 mmol/L — ABNORMAL HIGH (ref 3.5–5.1)

## 2020-02-11 LAB — BASIC METABOLIC PANEL
Anion gap: 9 (ref 5–15)
BUN: 21 mg/dL — ABNORMAL HIGH (ref 6–20)
CO2: 23 mmol/L (ref 22–32)
Calcium: 8.7 mg/dL — ABNORMAL LOW (ref 8.9–10.3)
Chloride: 103 mmol/L (ref 98–111)
Creatinine, Ser: 1.18 mg/dL (ref 0.61–1.24)
GFR, Estimated: >60 mL/min (ref >60)
Glucose, Bld: 158 mg/dL — ABNORMAL HIGH (ref 70–99)
Potassium: 5.6 mmol/L — ABNORMAL HIGH (ref 3.5–5.1)
Sodium: 135 mmol/L (ref 135–145)

## 2020-02-11 MED ORDER — DOXYCYCLINE MONOHYDRATE 100 MG PO TABS: 100.0000 mg | ORAL_TABLET | Freq: Two times a day (BID) | ORAL | 0 refills | Status: DC

## 2020-02-11 MED ORDER — MAGNESIUM SULFATE 2 GM/50ML IV SOLN
2.0000 g | Freq: Once | INTRAVENOUS | Status: AC
  Administered 2020-02-11: 2 g via INTRAVENOUS
  Filled 2020-02-11: qty 50

## 2020-02-11 MED ORDER — ORITAVANCIN DIPHOSPHATE 400 MG IV SOLR
1200.0000 mg | Freq: Once | INTRAVENOUS | Status: AC
  Administered 2020-02-11: 1200 mg via INTRAVENOUS
  Filled 2020-02-11: qty 120

## 2020-02-11 NOTE — Plan of Care (Signed)
  Problem: Nutrition: Goal: Adequate nutrition will be maintained Outcome: Progressing   Problem: Pain Managment: Goal: General experience of comfort will improve Outcome: Progressing   Problem: Skin Integrity: Goal: Risk for impaired skin integrity will decrease Outcome: Progressing   

## 2020-02-11 NOTE — Evaluation (Signed)
Physical Therapy One Time Evaluation Patient Details Name: Alex [name] MRN: [id number] DOB: 07/10/1985 Today's Date: 02/11/2020   History of Present Illness  34 year old male with past medical history of IV drug abuse, alcohol abuse, tricuspid valve MRSA endocarditis, HCV, recurrent hospitalizations for endocarditis who presented to the ED in police custody for concerns of chest pain and bilateral lower extremity rash which has been going on for several days.  Patient recently had a hospitalization from 10/4-11/9 for TV endocarditis with pulmonary septic emboli and paraspinal myositis.  Pt admitted for Septic emboli/tricuspid valve endocarditis/history of staph aureus bacteremia/history of paraspinal myositis/concern for recurrent, nonresolved disease  Clinical Impression  Patient evaluated by Physical Therapy with no further acute PT needs identified. All education has been completed and the patient has no further questions.  Pt mobilizing well (only had one instance of LOB due to ankle chain).  Pt declines need for PT. See below for any follow-up Physical Therapy or equipment needs. PT is signing off. Thank you for this referral.     Follow Up Recommendations No PT follow up    Equipment Recommendations  None recommended by PT    Recommendations for Other Services       Precautions / Restrictions Precautions Precaution Comments: police officer in room      Mobility  Bed Mobility Overal bed mobility: Independent                  Transfers Overall transfer level: Independent                  Ambulation/Gait Ambulation/Gait assistance: Supervision Gait Distance (Feet): 200 Feet Assistive device: None Gait Pattern/deviations: Step-through pattern;Decreased stride length     General Gait Details: pt with ankle chain and did trip on chain however self corrected, otherwise ambulating well, does report 2-3/4 dyspnea end of ambulation  Stairs             Wheelchair Mobility    Modified Rankin (Stroke Patients Only)       Balance                                             Pertinent Vitals/Pain Pain Assessment: Faces Faces Pain Scale: Hurts a little bit Pain Location: back Pain Descriptors / Indicators: Discomfort Pain Intervention(s): Repositioned;Patient requesting pain meds-RN notified;Monitored during session    Home Living                        Prior Function           Comments: Pt to d/c to jail per notes.     Hand Dominance        Extremity/Trunk Assessment   Upper Extremity Assessment Upper Extremity Assessment: Overall WFL for tasks assessed    Lower Extremity Assessment Lower Extremity Assessment: Generalized weakness    Cervical / Trunk Assessment Cervical / Trunk Assessment: Normal  Communication   Communication: No difficulties  Cognition Arousal/Alertness: Awake/alert Behavior During Therapy: WFL for tasks assessed/performed Overall Cognitive Status: Within Functional Limits for tasks assessed                                        General Comments      Exercises  Assessment/Plan    PT Assessment Patent does not need any further PT services  PT Problem List         PT Treatment Interventions      PT Goals (Current goals can be found in the Care Plan section)  Acute Rehab PT Goals PT Goal Formulation: All assessment and education complete, DC therapy    Frequency     Barriers to discharge        Co-evaluation               AM-PAC PT "6 Clicks" Mobility  Outcome Measure   Help needed moving from lying on your back to sitting on the side of a flat bed without using bedrails?: None Help needed moving to and from a bed to a chair (including a wheelchair)?: None Help needed standing up from a chair using your arms (e.g., wheelchair or bedside chair)?: None Help needed to walk in hospital room?: None Help needed  climbing 3-5 steps with a railing? : None 6 Click Score: 20    End of Session   Activity Tolerance: Patient tolerated treatment well Patient left: in bed;with call bell/phone within reach;Other (comment) (Emergency planning/management officer present) Nurse Communication: Mobility status PT Visit Diagnosis: Difficulty in walking, not elsewhere classified (R26.2)    Time: 1157-2620 PT Time Calculation (min) (ACUTE ONLY): 9 min   Charges:   PT Evaluation $PT Eval Low Complexity: 1 Low        Kati PT, DPT Acute Rehabilitation Services Pager: [phone] Office: [phone]  Maida Sale E 02/11/2020, 3:23 PM

## 2020-02-11 NOTE — Progress Notes (Addendum)
Subjective: Low to mid back pain  Antibiotics:  Anti-infectives (From admission, onward)   Start     Dose/Rate Route Frequency Ordered Stop   02/11/20 1700  Oritavancin Diphosphate (ORBACTIV) 1,200 mg in dextrose 5 % IVPB        1,200 mg 333.3 mL/hr over 180 Minutes Intravenous Once 02/11/20 1001     02/11/20 0000  doxycycline (ADOXA) 100 MG tablet  Status:  Discontinued        100 mg Oral 2 times daily 02/11/20 1026 02/11/20    02/10/20 1700  vancomycin (VANCOREADY) IVPB 750 mg/150 mL  Status:  Discontinued        750 mg 150 mL/hr over 60 Minutes Intravenous Every 12 hours 02/10/20 0922 02/11/20 1001   02/08/20 0500  vancomycin (VANCOCIN) IVPB 1000 mg/200 mL premix  Status:  Discontinued        1,000 mg 200 mL/hr over 60 Minutes Intravenous Every 12 hours 02/07/20 1631 02/10/20 0922   02/07/20 1730  vancomycin (VANCOREADY) IVPB 1500 mg/300 mL        1,500 mg 150 mL/hr over 120 Minutes Intravenous  Once 02/07/20 1631 02/07/20 2055   02/07/20 0830  DAPTOmycin (CUBICIN) 500 mg in sodium chloride 0.9 % IVPB        500 mg 220 mL/hr over 30 Minutes Intravenous  Once 02/07/20 0712 02/07/20 0913   02/07/20 0630  DAPTOmycin (CUBICIN) 500 mg in sodium chloride 0.9 % IVPB  Status:  Discontinued        500 mg 220 mL/hr over 30 Minutes Intravenous  Once 02/07/20 0612 02/07/20 0712   02/07/20 0615  ceFEPIme (MAXIPIME) 2 g in sodium chloride 0.9 % 100 mL IVPB        2 g 200 mL/hr over 30 Minutes Intravenous STAT 02/07/20 0610 02/07/20 0722      Medications: Scheduled Meds: . enoxaparin (LOVENOX) injection  40 mg Subcutaneous Daily  . furosemide  20 mg Oral Daily  . lidocaine-EPINEPHrine  30 mL Intradermal Once  . pantoprazole  40 mg Oral Daily  . sodium chloride flush  3 mL Intravenous Q12H   Continuous Infusions: . oritavancin (ORBACTIV) IVPB     PRN Meds:.acetaminophen **OR** acetaminophen, ketorolac, ondansetron **OR** ondansetron (ZOFRAN) IV, oxyCODONE **OR** oxyCODONE,  traMADol    Objective: Weight change: 2.586 kg  Intake/Output Summary (Last 24 hours) at 02/11/2020 1213 Last data filed at 02/11/2020 1208 Gross per 24 hour  Intake 1710 ml  Output 2000 ml  Net -290 ml   Blood pressure 114/86, pulse (!) 50, temperature 98.5 F (36.9 C), temperature source Oral, resp. rate 14, height 5\' 11"  (1.803 m), weight 69.4 kg, SpO2 99 %. Temp:  [97.6 F (36.4 C)-98.5 F (36.9 C)] 98.5 F (36.9 C) (12/02 0558) Pulse Rate:  [50-89] 50 (12/02 0558) Resp:  [9-27] 14 (12/02 0558) BP: (109-120)/(74-93) 114/86 (12/02 0558) SpO2:  [87 %-99 %] 99 % (12/02 0558) Weight:  [69.4 kg] 69.4 kg (12/02 0558)  Physical Exam: General: Alert and awake, oriented x3, pale, flat affect HEENT: anicteric sclera, EOMI CVS regular rate, normal no mgr heard Chest: , no wheezing, no respiratory distress, no rales Abdomen: soft non-distended,  Extremities: no edema or deformity noted bilaterally Skin:   02/08/2020 vasculitic rash:     02/11/2020:        Neuro: nonfocal  CBC:    BMET Recent Labs    02/10/20 0347 02/10/20 0347 02/11/20 0430 02/11/20 0911  NA 136  --  135  --   K 4.5   < > 5.6* 5.5*  CL 103  --  103  --   CO2 24  --  23  --   GLUCOSE 86  --  158*  --   BUN 19  --  21*  --   CREATININE 1.24  --  1.18  --   CALCIUM 8.2*  --  8.7*  --    < > = values in this interval not displayed.     Liver Panel  Recent Labs    02/09/20 0509 02/10/20 0347  PROT 5.9* 6.2*  ALBUMIN 2.6* 2.7*  AST 48* 44*  ALT 47* 51*  ALKPHOS 187* 191*  BILITOT 0.6 0.7       Sedimentation Rate No results for input(s): ESRSEDRATE in the last 72 hours. C-Reactive Protein Recent Labs    02/11/20 0430  CRP 1.4*    Micro Results: Recent Results (from the past 720 hour(s))  Blood Culture (routine x 2)     Status: None (Preliminary result)   Collection Time: 02/07/20  6:23 AM   Specimen: BLOOD RIGHT HAND  Result Value Ref Range Status   Specimen  Description   Final    BLOOD RIGHT HAND Performed at [facility], [address] [place], [place] [address]    Special Requests   Final    BOTTLES DRAWN AEROBIC AND ANAEROBIC Blood Culture results may not be optimal due to an inadequate volume of blood received in culture bottles Performed at [facility], [address] [place], [place] [address]    Culture   Final    NO GROWTH 4 DAYS Performed at [facility], [address] [place], [place] [address]    Report Status PENDING  Incomplete  Blood Culture (routine x 2)     Status: None (Preliminary result)   Collection Time: 02/07/20  6:23 AM   Specimen: BLOOD RIGHT FOREARM  Result Value Ref Range Status   Specimen Description   Final    BLOOD RIGHT FOREARM Performed at [facility], [address] [place], [place] [address]    Special Requests   Final    BOTTLES DRAWN AEROBIC AND ANAEROBIC Blood Culture adequate volume Performed at [facility], [address] [place], [place] [address]    Culture   Final    NO GROWTH 4 DAYS Performed at [facility], [address] [place], [place] [address]    Report Status PENDING  Incomplete  Urine culture     Status: None   Collection Time: 02/07/20  6:23 AM   Specimen: In/Out Cath Urine  Result Value Ref Range Status   Specimen Description   Final    IN/OUT CATH URINE Performed at [facility], [address] [place], [place] [address]    Special Requests   Final    URINE, RANDOM Performed at [facility], [address] [place], [place] [address]    Culture   Final    NO GROWTH Performed at [facility], [address] [place], [place] [address]    Report Status 02/08/2020 FINAL  Final  Resp Panel by RT-PCR (Flu A&B, Covid) Nasopharyngeal Swab     Status: None   Collection Time: 02/07/20  6:23 AM   Specimen: Nasopharyngeal Swab;  Nasopharyngeal(NP) swabs in vial transport medium  Result Value Ref Range Status   SARS Coronavirus 2 by  RT PCR NEGATIVE NEGATIVE Final    Comment: (NOTE) SARS-CoV-2 target nucleic acids are NOT DETECTED.  The SARS-CoV-2 RNA is generally detectable in upper respiratory specimens during the acute phase of infection. The lowest concentration of SARS-CoV-2 viral copies this assay can detect is 138 copies/mL. A negative result does not preclude SARS-Cov-2 infection and should not be used as the sole basis for treatment or other patient management decisions. A negative result may occur with  improper specimen collection/handling, submission of specimen other than nasopharyngeal swab, presence of viral mutation(s) within the areas targeted by this assay, and inadequate number of viral copies(<138 copies/mL). A negative result must be combined with clinical observations, patient history, and epidemiological information. The expected result is Negative.  Fact Sheet for Patients:  BloggerCourse.com  Fact Sheet for Healthcare Providers:  SeriousBroker.it  This test is no t yet approved or cleared by the [place] FDA and  has been authorized for detection and/or diagnosis of SARS-CoV-2 by FDA under an Emergency Use Authorization (EUA). This EUA will remain  in effect (meaning this test can be used) for the duration of the COVID-19 declaration under Section 564(b)(1) of the Act, 21 U.S.C.section 360bbb-3(b)(1), unless the authorization is terminated  or revoked sooner.       Influenza A by PCR NEGATIVE NEGATIVE Final   Influenza B by PCR NEGATIVE NEGATIVE Final    Comment: (NOTE) The Xpert Xpress SARS-CoV-2/FLU/RSV plus assay is intended as an aid in the diagnosis of influenza from Nasopharyngeal swab specimens and should not be used as a sole basis for treatment. Nasal washings and aspirates are unacceptable for Xpert Xpress  SARS-CoV-2/FLU/RSV testing.  Fact Sheet for Patients: BloggerCourse.com  Fact Sheet for Healthcare Providers: SeriousBroker.it  This test is not yet approved or cleared by the [place] FDA and has been authorized for detection and/or diagnosis of SARS-CoV-2 by FDA under an Emergency Use Authorization (EUA). This EUA will remain in effect (meaning this test can be used) for the duration of the COVID-19 declaration under Section 564(b)(1) of the Act, 21 U.S.C. section 360bbb-3(b)(1), unless the authorization is terminated or revoked.  Performed at [facility], [address], [place], [place] [address]     Studies/Results: MR THORACIC SPINE W WO CONTRAST  Result Date: 02/10/2020 CLINICAL DATA:  Septic emboli, back pain. EXAM: MRI THORACIC AND LUMBAR SPINE WITHOUT AND WITH CONTRAST TECHNIQUE: Multiplanar and multiecho pulse sequences of the thoracic and lumbar spine were obtained without and with intravenous contrast. CONTRAST:  70mL GADAVIST GADOBUTROL 1 MMOL/ML IV SOLN COMPARISON:  MRI of the thoracic spine January 14, 2020. FINDINGS: MRI THORACIC SPINE FINDINGS Alignment:  Physiologic. Vertebrae: No fracture, evidence of discitis, or bone lesion. No abnormal contrast enhancement. Cord:  Normal signal and morphology. Paraspinal and other soft tissues: Negative paraspinal soft tissues. Bilateral pleural effusion. Please refer to CT angiogram of the chest performed on February 07, 2020 for additional chest findings. Disc levels: No significant disc herniation, spinal canal or neural foraminal stenosis. MRI LUMBAR SPINE FINDINGS Segmentation:  Standard. Alignment:  Physiologic. Vertebrae: No fracture, evidence of discitis, or bone lesion. No abnormal contrast enhancement. Conus medullaris: Extends to the T12-L1 level and appears normal. Paraspinal and other soft tissues: No evidence of psoas or paravertebral muscle edema or  abscess. Disc levels: Tiny left subarticular disc protrusion at L5-S1. No significant spinal canal or neural foraminal stenosis at any level. IMPRESSION: 1. No evidence of discitis-osteomyelitis or epidural abscess. 2. No significant spinal canal or  neural foraminal stenosis at any level. 3. Bilateral pleural effusion. Please refer to CT angiogram of the chest performed on February 07, 2020 for additional chest findings. Electronically Signed   By: Baldemar Lenis M.D.   On: 02/10/2020 20:49   MR Lumbar Spine W Wo Contrast  Result Date: 02/10/2020 CLINICAL DATA:  Septic emboli, back pain. EXAM: MRI THORACIC AND LUMBAR SPINE WITHOUT AND WITH CONTRAST TECHNIQUE: Multiplanar and multiecho pulse sequences of the thoracic and lumbar spine were obtained without and with intravenous contrast. CONTRAST:  6mL GADAVIST GADOBUTROL 1 MMOL/ML IV SOLN COMPARISON:  MRI of the thoracic spine January 14, 2020. FINDINGS: MRI THORACIC SPINE FINDINGS Alignment:  Physiologic. Vertebrae: No fracture, evidence of discitis, or bone lesion. No abnormal contrast enhancement. Cord:  Normal signal and morphology. Paraspinal and other soft tissues: Negative paraspinal soft tissues. Bilateral pleural effusion. Please refer to CT angiogram of the chest performed on February 07, 2020 for additional chest findings. Disc levels: No significant disc herniation, spinal canal or neural foraminal stenosis. MRI LUMBAR SPINE FINDINGS Segmentation:  Standard. Alignment:  Physiologic. Vertebrae: No fracture, evidence of discitis, or bone lesion. No abnormal contrast enhancement. Conus medullaris: Extends to the T12-L1 level and appears normal. Paraspinal and other soft tissues: No evidence of psoas or paravertebral muscle edema or abscess. Disc levels: Tiny left subarticular disc protrusion at L5-S1. No significant spinal canal or neural foraminal stenosis at any level. IMPRESSION: 1. No evidence of discitis-osteomyelitis or epidural  abscess. 2. No significant spinal canal or neural foraminal stenosis at any level. 3. Bilateral pleural effusion. Please refer to CT angiogram of the chest performed on February 07, 2020 for additional chest findings. Electronically Signed   By: Baldemar Lenis M.D.   On: 02/10/2020 20:49      Assessment/Plan:  INTERVAL HISTORY:   Rash better, and MRIs without infection seen  Principal Problem:   Septic embolism (HCC) Active Problems:   Polysubstance dependence (HCC)   Endocarditis of tricuspid valve   MRSA bacteremia   Vasculitis (HCC)   Nonspecific chest pain    Alex [name] is a 34 y.o. 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  He completed dapto/doxy, dapto--> zyvox experienced recurrence of chest pain 3 days prior to admission with worsening rash. CXR shows worsening cavitation. TTE continues to show mobile vegetations with severe TR. He has mid to low back pain  MRSA TV endocarditis , prior bacteremia back pain in person living with IV drug use CVTS felt pt not candidate for angiovac during prior admission.  MRI T and L spine without infection  Blood cultures sterile  Given no + blood cultures I would like to go ahead and give him a dose of IV ORITAVANCIN today followed by 6 weeks or oral doxycycline with followup at [facility] and we will directly provide him with these antibiotics today  IVDU: needs long term plan for this  HCV: can treat as an outpatient.  Rash: seems c/w with that seen with SAB and is getting better    Alex [name] has an appointment on 02/24/2020 at 1015 AM with Dr. Renold Don  The [facility] is located in the [facility] at  [address] [place] in [place].  [address], which is located to the left of the elevators.  Phone: [phone]  Fax:  [phone]  [email or web address]  He should arrive 15 minutes prior to his appt and he should be brought there from jail if still in jail.  I will sign off for now Please call with further questions.   LOS: 4 days   Acey Lav 02/11/2020, 12:13 PM

## 2020-02-11 NOTE — Plan of Care (Signed)
  Problem: Nutrition: Goal: Adequate nutrition will be maintained Outcome: Progressing   Problem: Pain Managment: Goal: General experience of comfort will improve Outcome: Progressing   Problem: Skin Integrity: Goal: Risk for impaired skin integrity will decrease Outcome: Progressing   Problem: Education: Goal: Knowledge of disease or condition will improve Outcome: Progressing Goal: Understanding of discharge needs will improve Outcome: Progressing   Problem: Safety: Goal: Ability to remain free from injury will improve Outcome: Progressing   

## 2020-02-11 NOTE — Progress Notes (Signed)
Patient ID: Alex [name], male   DOB: 01/05/1986, 34 y.o.   MRN: [id number]  PROGRESS NOTE    Alex [name]  TKZ:[id number] DOB: 01/05/1986 DOA: 02/07/2020 PCP: Patient, No Pcp Per   Brief Narrative:  34 year old male with past medical history of IV drug abuse, alcohol abuse, tricuspid valve MRSA endocarditis, HCV, recurrent hospitalizations for endocarditis who presented to the ED in police custody for concerns of chest pain and bilateral lower extremity rash which has been going on for several days. Patient recently had a hospitalization from 10/4-11/9 for TV endocarditis with pulmonary septic emboli and paraspinal myositis. He was initially treated with vancomycin and then switched to daptomycin due to a rash and discharged on linezolid for 2 weeks. States that he finished his antibiotics a few days ago and then had his presenting symptoms soon after. Denies any recent IV drug use but has been using amphetamines and marijuana in the past several days. He was seen by CT surgery during his last hospitalization and was not deemed a candidate for TV repair or Angiomax.  On presentation, patient was hemodynamically stable. CXR: Bilateral predominant scarring with multiple upper lobe predominant cystic lucencies likely representing postinflammatory/infectious pneumatoceles. CTA chest: No acute PE, consistent with multifocal septic emboli most of the previously noted nodules demonstrate interval improvement however there is a new cavitary lung nodule in the posterior right lower lobe measuring 1.9 cm concerning for recurrent septic emboli, enlarged RA and RV with reflux of contrast into the IVC and hepatic veins consistent with passive venous congestion due to right heart failure.  Patient was started on IV antibiotics.  Subsequently ID, cardiothoracic surgery and cardiology were consulted.  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 Cliffton AstersLightfoot appreciated: Recommended continuing medical management with IV antibiotics and currently does not need surgical intervention -ID following: Antibiotics as per ID recommendations.  MRI of thoracolumbar spine was negative for osteomyelitis/abscesses.  ID recommended punch biopsy of skin lesions.  General surgery has been consulted: patient not decided yet -Blood cultures negative so far -Echo with mobile vegetations on the posterior and anterior TV leaflets, severe TV regurgitation   Acute on chronic diastolic heart failure -Echo with EF 60-65%, RV systolic function mildly reduced, flattening of the IV septum -Strict input and output.  Daily weights.  Fluid restriction.  Continue Lasix and spironolactone.  Cardiology has signed off and recommend outpatient follow-up -Hold spironolactone because of hyperkalemia  Hyperkalemia -Potassium 5.6 this morning.  Repeat stat potassium.  Hold spironolactone.  Rash -Possibly drug rash.  ID recommended biopsy of the rash for which neurosurgery has been consulted.  He is resistant to skin biopsy: still not decided.  Polysubstance abuse/history of IVDU -Denies recent IVDU, but admits to recent marijuana, meth, heroin use -Strongly encouraged cessation -Prior hospitalist discussed and recommended suboxone, he's declined this  Hepatitis C -Outpatient follow-up with ID  Chronic normocytic anemia -Stable  DVT prophylaxis: Lovenox  code Status: Full Family Communication: None at bedside Disposition Plan: Status is: Inpatient  Remains inpatient appropriate because:Inpatient level of care appropriate due to severity of  illness   Dispo: The patient is from: Home  Anticipated d/c is to: Home.  Apparently patient will be discharged to prison once medically stable              Anticipated d/c date is: 2 days              Patient currently is not medically stable to d/c.  Consultants: ID/CT surgery/cardiology/general surgery  Procedures:  Echo IMPRESSIONS    1. Left ventricular ejection fraction, by estimation, is 60 to 65%. The  left ventricle has normal function. The left ventricle has no regional  wall motion abnormalities. There is mild left ventricular hypertrophy.  Left ventricular diastolic parameters  were normal. There is the interventricular septum is flattened in systole  and diastole, consistent with right ventricular pressure and volume  overload.  2. Coarse trabeculation of the RV apex. Right ventricular systolic  function is mildly reduced. The right ventricular size is severely  enlarged.  3. Right atrial size was severely dilated.  4. The mitral valve is abnormal. Trivial mitral valve regurgitation.  There is mild late systolic prolapse of multiple scallops of the posterior  leaflet of the mitral valve.  5. Mobile vegetations noted on the posterior and anterior TV leaflets  (measures 1.5 x 0.7 cm posterior and up to 2.21 x 0.5 cm on the anterior  leaflet).. The tricuspid valve is abnormal. Tricuspid valve regurgitation  is severe.  6. The aortic valve is tricuspid. Aortic valve regurgitation is not  visualized.  7. The inferior vena cava is dilated in size with <50% respiratory  variability, suggesting right atrial pressure of 15 mmHg.   Comparison(s): 12/14/2019: LVEF 60-65%, prior vegetations noted on the TV.   Antimicrobials:  Anti-infectives (From admission, onward)   Start     Dose/Rate Route Frequency Ordered Stop   02/11/20 1700  Oritavancin Diphosphate (ORBACTIV) 1,200 mg in dextrose 5 % IVPB        1,200 mg 333.3 mL/hr over 180 Minutes Intravenous  Once 02/11/20 1001     02/10/20 1700  vancomycin (VANCOREADY) IVPB 750 mg/150 mL  Status:  Discontinued        750 mg 150 mL/hr over 60 Minutes Intravenous Every 12 hours 02/10/20 0922 02/11/20 1001   02/08/20 0500  vancomycin (VANCOCIN) IVPB 1000 mg/200 mL premix  Status:  Discontinued        1,000 mg 200 mL/hr over 60 Minutes Intravenous Every 12 hours 02/07/20 1631 02/10/20 0922   02/07/20 1730  vancomycin (VANCOREADY) IVPB 1500 mg/300 mL        1,500 mg 150 mL/hr over 120 Minutes Intravenous  Once 02/07/20 1631 02/07/20 2055   02/07/20 0830  DAPTOmycin (CUBICIN) 500 mg in sodium chloride 0.9 % IVPB        500 mg 220 mL/hr over 30 Minutes Intravenous  Once 02/07/20 0712 02/07/20 0913   02/07/20 0630  DAPTOmycin (CUBICIN) 500 mg in sodium chloride 0.9 % IVPB  Status:  Discontinued        500 mg 220 mL/hr over 30 Minutes Intravenous  Once 02/07/20 0612 02/07/20 0712   02/07/20 0615  ceFEPIme (MAXIPIME) 2 g in sodium chloride 0.9 % 100 mL IVPB        2 g 200 mL/hr over 30 Minutes Intravenous STAT 02/07/20 0610 02/07/20 0722       Subjective: Patient seen and examined at bedside.  Complains of severe back pain.  No overnight fever, worsening shortness of breath or chest pain.   Objective: Vitals:   02/10/20 1611 02/10/20 1641  02/10/20 2055 02/11/20 0558  BP: 115/83 (!) 117/93 116/78 114/86  Pulse: 65 72 72 (!) 50  Resp: (!) 27 16 16 14   Temp: 97.8 F (36.6 C) 98.1 F (36.7 C) 98.5 F (36.9 C) 98.5 F (36.9 C)  TempSrc:  Oral Oral Oral  SpO2: 96% 98% 98% 99%  Weight:    69.4 kg  Height:        Intake/Output Summary (Last 24 hours) at 02/11/2020 1021 Last data filed at 02/11/2020 0917 Gross per 24 hour  Intake 1710 ml  Output 1250 ml  Net 460 ml   Filed Weights   02/07/20 1323 02/10/20 0527 02/11/20 0558  Weight: 61.7 kg 66.8 kg 69.4 kg    Examination:  General exam: No acute distress.  Poor historian.  Chronically ill looking. Respiratory system: Decreased breath  sounds at bases bilaterally with some scattered crackles Cardiovascular system: S1-S2 heard, rate controlled Gastrointestinal system: Abdomen is nondistended, soft and nontender.  Normal bowel sounds heard  extremities: No clubbing or cyanosis.  Mild lower extremity edema present..   Central nervous system: Alert and oriented.  No focal neurological deficits.  Moving extremities.   Skin: Lower extremity rash present bilaterally Psychiatry: Flat affect   Data Reviewed: I have personally reviewed following labs and imaging studies  CBC: Recent Labs  Lab 02/07/20 0623 02/08/20 0415 02/09/20 0509 02/10/20 0347 02/11/20 0430  WBC 8.6 7.2 7.1 7.8 7.7  NEUTROABS 6.0  --   --  5.2  --   HGB 10.4* 9.2* 9.9* 10.1* 9.9*  HCT 35.0* 30.5* 32.9* 33.7* 32.8*  MCV 89.3 86.2 87.3 86.6 86.5  PLT 212 176 189 197 185   Basic Metabolic Panel: Recent Labs  Lab 02/07/20 0623 02/07/20 0623 02/08/20 0415 02/09/20 0509 02/10/20 0347 02/11/20 0430 02/11/20 0911  NA 139  --  135 136 136 135  --   K 4.4   < > 4.6 4.7 4.5 5.6* 5.5*  CL 105  --  104 106 103 103  --   CO2 24  --  23 24 24 23   --   GLUCOSE 78  --  95 100* 86 158*  --   BUN 10  --  19 23* 19 21*  --   CREATININE 0.99  --  1.02 1.22 1.24 1.18  --   CALCIUM 8.6*  --  8.2* 8.2* 8.2* 8.7*  --   MG  --   --   --  1.7 1.6* 1.6*  --   PHOS  --   --   --  3.9 4.4  --   --    < > = values in this interval not displayed.   GFR: Estimated Creatinine Clearance: 86.6 mL/min (by C-G formula based on SCr of 1.18 mg/dL). Liver Function Tests: Recent Labs  Lab 02/07/20 0623 02/09/20 0509 02/10/20 0347  AST 59* 48* 44*  ALT 51* 47* 51*  ALKPHOS 218* 187* 191*  BILITOT 0.8 0.6 0.7  PROT 7.5 5.9* 6.2*  ALBUMIN 3.2* 2.6* 2.7*   No results for input(s): LIPASE, AMYLASE in the last 168 hours. No results for input(s): AMMONIA in the last 168 hours. Coagulation Profile: Recent Labs  Lab 02/07/20 0711 02/08/20 0415  INR 1.4* 1.3*    Cardiac Enzymes: No results for input(s): CKTOTAL, CKMB, CKMBINDEX, TROPONINI in the last 168 hours. BNP (last 3 results) No results for input(s): PROBNP in the last 8760 hours. HbA1C: No results for input(s): HGBA1C in the last 72 hours. CBG:  No results for input(s): GLUCAP in the last 168 hours. Lipid Profile: No results for input(s): CHOL, HDL, LDLCALC, TRIG, CHOLHDL, LDLDIRECT in the last 72 hours. Thyroid Function Tests: No results for input(s): TSH, T4TOTAL, FREET4, T3FREE, THYROIDAB in the last 72 hours. Anemia Panel: No results for input(s): VITAMINB12, FOLATE, FERRITIN, TIBC, IRON, RETICCTPCT in the last 72 hours. Sepsis Labs: Recent Labs  Lab 02/07/20 7989  LATICACIDVEN 1.5  2.0*    Recent Results (from the past 240 hour(s))  Blood Culture (routine x 2)     Status: None (Preliminary result)   Collection Time: 02/07/20  6:23 AM   Specimen: BLOOD RIGHT HAND  Result Value Ref Range Status   Specimen Description   Final    BLOOD RIGHT HAND Performed at [facility], [address], [place], [place] [address]    Special Requests   Final    BOTTLES DRAWN AEROBIC AND ANAEROBIC Blood Culture results may not be optimal due to an inadequate volume of blood received in culture bottles Performed at [facility], [address], [place], [place] [address]    Culture   Final    NO GROWTH 4 DAYS Performed at [facility], [address] [place], [place] [address]    Report Status PENDING  Incomplete  Blood Culture (routine x 2)     Status: None (Preliminary result)   Collection Time: 02/07/20  6:23 AM   Specimen: BLOOD RIGHT FOREARM  Result Value Ref Range Status   Specimen Description   Final    BLOOD RIGHT FOREARM Performed at [facility], [address] [place], [place] [address]    Special Requests   Final    BOTTLES DRAWN AEROBIC AND ANAEROBIC Blood Culture adequate volume Performed at [facility], [address], [place], [place] [address]    Culture   Final    NO GROWTH 4 DAYS Performed at [facility], [address], [place], [place] [address]    Report Status PENDING  Incomplete  Urine culture     Status: None   Collection Time: 02/07/20  6:23 AM   Specimen: In/Out Cath Urine  Result Value Ref Range Status   Specimen Description   Final    IN/OUT CATH URINE Performed at [facility], [address], [place], [place] [address]    Special Requests   Final    URINE, RANDOM Performed at [facility], [address], [place], [place] [address]    Culture   Final    NO GROWTH Performed at [facility], [address] [place], [place] [address]    Report Status 02/08/2020 FINAL  Final  Resp Panel by RT-PCR (Flu A&B, Covid) Nasopharyngeal Swab     Status: None   Collection Time: 02/07/20  6:23 AM   Specimen: Nasopharyngeal Swab; Nasopharyngeal(NP) swabs in vial transport medium  Result Value Ref Range Status   SARS Coronavirus 2 by RT PCR NEGATIVE NEGATIVE Final    Comment: (NOTE) SARS-CoV-2 target nucleic acids are NOT DETECTED.  The SARS-CoV-2 RNA is generally detectable in upper respiratory specimens during the acute phase of infection. The lowest concentration of SARS-CoV-2 viral copies this assay can detect is 138 copies/mL. A negative result does not preclude SARS-Cov-2 infection and should not be used as the sole basis for treatment or other patient management decisions. A negative result may occur with  improper specimen collection/handling, submission  of specimen other than nasopharyngeal swab, presence of viral mutation(s) within the areas targeted by this assay, and inadequate number of viral copies(<138 copies/mL). A negative result must be combined with clinical observations, patient history, and epidemiological information. The expected result is Negative.  Fact Sheet for Patients:    BloggerCourse.com  Fact Sheet for Healthcare Providers:  SeriousBroker.it  This test is no t yet approved or cleared by the [place] FDA and  has been authorized for detection and/or diagnosis of SARS-CoV-2 by FDA under an Emergency Use Authorization (EUA). This EUA will remain  in effect (meaning this test can be used) for the duration of the COVID-19 declaration under Section 564(b)(1) of the Act, 21 U.S.C.section 360bbb-3(b)(1), unless the authorization is terminated  or revoked sooner.       Influenza A by PCR NEGATIVE NEGATIVE Final   Influenza B by PCR NEGATIVE NEGATIVE Final    Comment: (NOTE) The Xpert Xpress SARS-CoV-2/FLU/RSV plus assay is intended as an aid in the diagnosis of influenza from Nasopharyngeal swab specimens and should not be used as a sole basis for treatment. Nasal washings and aspirates are unacceptable for Xpert Xpress SARS-CoV-2/FLU/RSV testing.  Fact Sheet for Patients: BloggerCourse.com  Fact Sheet for Healthcare Providers: SeriousBroker.it  This test is not yet approved or cleared by the [place] FDA and has been authorized for detection and/or diagnosis of SARS-CoV-2 by FDA under an Emergency Use Authorization (EUA). This EUA will remain in effect (meaning this test can be used) for the duration of the COVID-19 declaration under Section 564(b)(1) of the Act, 21 U.S.C. section 360bbb-3(b)(1), unless the authorization is terminated or revoked.  Performed at [facility], [address], [place], [place] [address]          Radiology Studies: MR THORACIC SPINE W WO CONTRAST  Result Date: 02/10/2020 CLINICAL DATA:  Septic emboli, back pain. EXAM: MRI THORACIC AND LUMBAR SPINE WITHOUT AND WITH CONTRAST TECHNIQUE: Multiplanar and multiecho pulse sequences of the thoracic and lumbar spine were obtained without and  with intravenous contrast. CONTRAST:  6mL GADAVIST GADOBUTROL 1 MMOL/ML IV SOLN COMPARISON:  MRI of the thoracic spine January 14, 2020. FINDINGS: MRI THORACIC SPINE FINDINGS Alignment:  Physiologic. Vertebrae: No fracture, evidence of discitis, or bone lesion. No abnormal contrast enhancement. Cord:  Normal signal and morphology. Paraspinal and other soft tissues: Negative paraspinal soft tissues. Bilateral pleural effusion. Please refer to CT angiogram of the chest performed on February 07, 2020 for additional chest findings. Disc levels: No significant disc herniation, spinal canal or neural foraminal stenosis. MRI LUMBAR SPINE FINDINGS Segmentation:  Standard. Alignment:  Physiologic. Vertebrae: No fracture, evidence of discitis, or bone lesion. No abnormal contrast enhancement. Conus medullaris: Extends to the T12-L1 level and appears normal. Paraspinal and other soft tissues: No evidence of psoas or paravertebral muscle edema or abscess. Disc levels: Tiny left subarticular disc protrusion at L5-S1. No significant spinal canal or neural foraminal stenosis at any level. IMPRESSION: 1. No evidence of discitis-osteomyelitis or epidural abscess. 2. No significant spinal canal or neural foraminal stenosis at any level. 3. Bilateral pleural effusion. Please refer to CT angiogram of the chest performed on February 07, 2020 for additional chest findings. Electronically Signed   By: Baldemar Lenis M.D.   On: 02/10/2020 20:49   MR Lumbar Spine W Wo Contrast  Result Date: 02/10/2020 CLINICAL DATA:  Septic emboli, back pain. EXAM: MRI THORACIC AND LUMBAR SPINE WITHOUT AND WITH CONTRAST TECHNIQUE: Multiplanar and multiecho  pulse sequences of the thoracic and lumbar spine were obtained without and with intravenous contrast. CONTRAST:  55mL GADAVIST GADOBUTROL 1 MMOL/ML IV SOLN COMPARISON:  MRI of the thoracic spine January 14, 2020. FINDINGS: MRI THORACIC SPINE FINDINGS Alignment:  Physiologic. Vertebrae:  No fracture, evidence of discitis, or bone lesion. No abnormal contrast enhancement. Cord:  Normal signal and morphology. Paraspinal and other soft tissues: Negative paraspinal soft tissues. Bilateral pleural effusion. Please refer to CT angiogram of the chest performed on February 07, 2020 for additional chest findings. Disc levels: No significant disc herniation, spinal canal or neural foraminal stenosis. MRI LUMBAR SPINE FINDINGS Segmentation:  Standard. Alignment:  Physiologic. Vertebrae: No fracture, evidence of discitis, or bone lesion. No abnormal contrast enhancement. Conus medullaris: Extends to the T12-L1 level and appears normal. Paraspinal and other soft tissues: No evidence of psoas or paravertebral muscle edema or abscess. Disc levels: Tiny left subarticular disc protrusion at L5-S1. No significant spinal canal or neural foraminal stenosis at any level. IMPRESSION: 1. No evidence of discitis-osteomyelitis or epidural abscess. 2. No significant spinal canal or neural foraminal stenosis at any level. 3. Bilateral pleural effusion. Please refer to CT angiogram of the chest performed on February 07, 2020 for additional chest findings. Electronically Signed   By: Baldemar Lenis M.D.   On: 02/10/2020 20:49        Scheduled Meds: . enoxaparin (LOVENOX) injection  40 mg Subcutaneous Daily  . furosemide  20 mg Oral Daily  . lidocaine-EPINEPHrine  30 mL Intradermal Once  . pantoprazole  40 mg Oral Daily  . sodium chloride flush  3 mL Intravenous Q12H   Continuous Infusions: . magnesium sulfate bolus IVPB 2 g (02/11/20 0930)  . oritavancin (ORBACTIV) IVPB            Glade Lloyd, MD [facility] 02/11/2020, 10:21 AM

## 2020-02-11 NOTE — Plan of Care (Signed)
Pt with guard maintained at the bedside. Pt given snacks through out the night. Generalized pain rated 7-9/10 Pt a little irritable stating that he wanted dinner. [place] sandwich and other snacks given to patient. Follow up on pain meds usually rated 3-5 . Chocolate ice cream and coffee given per patient request. Noted B/L legs with red raised rash. Patient states "Its been there". No weeping noted.  Per Telemetry monitor tech, HR SB and respirations between 9-10. Call light in reach and frequent rounding maintained.  Attempted to call breakfast for patient, phone log backed up and placed on hold. Day shift nurse aware and will follow up.   Daily weight 69.4kg this am.

## 2020-02-12 DIAGNOSIS — I509 Heart failure, unspecified: Secondary | ICD-10-CM

## 2020-02-12 LAB — CBC
HCT: 33.3 % — ABNORMAL LOW (ref 39.0–52.0)
Hemoglobin: 9.8 g/dL — ABNORMAL LOW (ref 13.0–17.0)
MCH: 26.1 pg (ref 26.0–34.0)
MCHC: 29.4 g/dL — ABNORMAL LOW (ref 30.0–36.0)
MCV: 88.8 fL (ref 80.0–100.0)
Platelets: 181 10*3/uL (ref 150–400)
RBC: 3.75 MIL/uL — ABNORMAL LOW (ref 4.22–5.81)
RDW: 19 % — ABNORMAL HIGH (ref 11.5–15.5)
WBC: 9.9 10*3/uL (ref 4.0–10.5)
nRBC: 0 % (ref 0.0–0.2)

## 2020-02-12 LAB — CULTURE, BLOOD (ROUTINE X 2)
Culture: NO GROWTH
Culture: NO GROWTH
Special Requests: ADEQUATE

## 2020-02-12 LAB — BASIC METABOLIC PANEL
Anion gap: 8 (ref 5–15)
BUN: 26 mg/dL — ABNORMAL HIGH (ref 6–20)
CO2: 24 mmol/L (ref 22–32)
Calcium: 8.4 mg/dL — ABNORMAL LOW (ref 8.9–10.3)
Chloride: 104 mmol/L (ref 98–111)
Creatinine, Ser: 1.19 mg/dL (ref 0.61–1.24)
GFR, Estimated: >60 mL/min (ref >60)
Glucose, Bld: 87 mg/dL (ref 70–99)
Potassium: 4.9 mmol/L (ref 3.5–5.1)
Sodium: 136 mmol/L (ref 135–145)

## 2020-02-12 LAB — MAGNESIUM: Magnesium: 2.1 mg/dL (ref 1.7–2.4)

## 2020-02-12 MED ORDER — OXYCODONE HCL 5 MG PO TABS: 5.0000 mg | ORAL_TABLET | Freq: Four times a day (QID) | ORAL | 0 refills | Status: DC | PRN

## 2020-02-12 MED ORDER — FUROSEMIDE 20 MG PO TABS
20.0000 mg | ORAL_TABLET | Freq: Every day | ORAL | 0 refills | Status: DC
Start: 2020-02-12 — End: 2020-06-02

## 2020-02-12 MED ORDER — DOXYCYCLINE HYCLATE 100 MG PO TABS
100.0000 mg | ORAL_TABLET | Freq: Two times a day (BID) | ORAL | Status: DC
  Administered 2020-02-12: 100 mg via ORAL
  Filled 2020-02-12: qty 1

## 2020-02-12 MED ORDER — DOXYCYCLINE MONOHYDRATE 100 MG PO TABS: 100.0000 mg | ORAL_TABLET | Freq: Two times a day (BID) | ORAL | 0 refills | Status: AC

## 2020-02-12 MED ORDER — TRAMADOL HCL 50 MG PO TABS: 50.0000 mg | ORAL_TABLET | Freq: Four times a day (QID) | ORAL | 0 refills | Status: DC | PRN

## 2020-02-12 NOTE — Discharge Summary (Signed)
Physician Discharge Summary  Alex [name] WUJ:[id number] DOB: 03/03/1986 DOA: 02/07/2020  PCP: Patient, No Pcp Per  Admit date: 02/07/2020 Discharge date: 02/12/2020  Admitted From: Jail Disposition: Jail  Recommendations for Outpatient Follow-up:  1. Follow up with PCP in 1 week with repeat CBC/BMP 2. Outpatient follow-up with ID and cardiology 3. Comply with medications and follow-up 4. Abstain from illicit drug use 5. Follow up in ED if symptoms worsen or new appear   Home Health: No Equipment/Devices: None  Discharge Condition: Stable CODE STATUS: Full Diet recommendation: Heart healthy/fluid restriction of up to 1500 cc a day  Brief/Interim Summary: 34 year old male with past medical history of IV drug abuse, alcohol abuse, tricuspid valve MRSA endocarditis, HCV, recurrent hospitalizations for endocarditis who presented to the ED in police custody for concerns of chest pain and bilateral lower extremity rash which has been going on for several days. Patient recently had a hospitalization from 10/4-11/9 for TV endocarditis with pulmonary septic emboli and paraspinal myositis. He was initially treated with vancomycin and then switched to daptomycin due to a rash and discharged on linezolid for 2 weeks. States that he finished his antibiotics a few days ago and then had his presenting symptoms soon after. Denies any recent IV drug use but has been using amphetamines and marijuana in the past several days. He was seen by CT surgery during his last hospitalization and was not deemed a candidate for TV repair or Angiomax.  On presentation, patient was hemodynamically stable. CXR: Bilateral predominant scarring with multiple upper lobe predominant cystic lucencies likely representing postinflammatory/infectious pneumatoceles. CTA chest: No acute PE, consistent with multifocal septic emboli most of the previously noted nodules demonstrate interval improvement however there is a new  cavitary lung nodule in the posterior right lower lobe measuring 1.9 cm concerning for recurrent septic emboli, enlarged RA and RV with reflux of contrast into the IVC and hepatic veins consistent with passive venous congestion due to right heart failure.  Patient was started on IV antibiotics.  Subsequently ID, cardiothoracic surgery and cardiology were consulted.  During the hospitalization, his blood cultures have remained negative.  No recent temperature spikes.  MRI of thoracolumbar spine was negative for abscess or any osteomyelitis/discitis.  Cardiothoracic recommended conservative nonsurgical management for now.  Cardiology has signed off with outpatient follow-up recommendations.  Patient was getting IV vancomycin and subsequently received IV oritavancin 1 dose on 02/11/2020 and ID recommends oral doxycycline for 6 weeks with outpatient follow-up with ID.  Patient has tolerated PT.  He will be discharged back to jail today with outpatient follow-up with consultants.  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 Cliffton Asters appreciated: Recommended continuing medical management with IV antibiotics and currently does not need surgical intervention -ID following: MRI of thoracolumbar spine was negative for osteomyelitis/abscesses.  ID initially recommended punch biopsy of skin lesions.  General surgery was subsequently consulted but patient initially refused the punch biopsy.  Subsequently, punch biopsy was not pursued as this would not change patient's management since rash was probably secondary to subacute bacterial  endocarditis as per ID. -Blood cultures negative so far -Echo with mobile vegetations on the posterior and anterior TV leaflets, severe TV regurgitation  -  Patient was getting IV vancomycin and subsequently received IV oritavancin 1 dose on 02/11/2020 and ID recommends oral doxycycline for 6 weeks with outpatient follow-up with ID.  Patient has tolerated PT.  He will be discharged back to jail today with outpatient follow-up with consultants.  Acute on chronic diastolic heart failure -Echo with EF 60-65%, RV systolic function mildly reduced, flattening of the IV septum -Continue low-salt diet and 1500 cc a day Fluid restriction.  Continue Lasix. Cardiology has signed off and recommend outpatient follow-up -Hold spironolactone because of hyperkalemia  Hyperkalemia -Resolved after holding spironolactone.  Outpatient follow-up.  Rash -Possibly due to subacute bacterial endocarditis.  Improving.  Discussion as above.  Polysubstance abuse/history of IVDU -Denies recent IVDU, but admits to recent marijuana, meth, heroin use -Strongly encouraged cessation -Prior hospitalist discussed and recommended suboxone, he's declined this  Hepatitis C -Outpatient follow-up with ID  Chronic normocytic anemia -Stable  Discharge Instructions  Discharge Instructions    Ambulatory referral to Cardiology   Complete by: As directed    Hospital followup   Ambulatory referral to Infectious Disease   Complete by: As directed    Hospital followup   Diet - low sodium heart healthy   Complete by: As directed    Increase activity slowly   Complete by: As directed      Allergies as of 02/12/2020   No Active Allergies     Medication List    STOP taking these medications   spironolactone 25 MG tablet Commonly known as: ALDACTONE     TAKE these medications   acetaminophen 325 MG tablet Commonly known as: TYLENOL Take 2 tablets (650 mg total) by mouth every 4 (four) hours as needed for headache  or mild pain.   albuterol 108 (90 Base) MCG/ACT inhaler Commonly known as: Proventil HFA Inhale 1-2 puffs into the lungs every 4 (four) hours as needed for wheezing or shortness of breath.   doxycycline 100 MG tablet Commonly known as: ADOXA Take 1 tablet (100 mg total) by mouth 2 (two) times daily for 84 doses. Begin taking 12/10   furosemide 20 MG tablet Commonly known as: LASIX Take 1 tablet (20 mg total) by mouth daily.   oxyCODONE 5 MG immediate release tablet Commonly known as: Oxy IR/ROXICODONE Take 1 tablet (5 mg total) by mouth every 6 (six) hours as needed for severe pain.   traMADol 50 MG tablet Commonly known as: ULTRAM Take 1 tablet (50 mg total) by mouth every 6 (six) hours as needed for moderate pain. What changed: reasons to take this       Follow-up Information    Croitoru, Mihai, MD. Schedule an appointment as soon as possible for a visit in 1 week(s).   Specialty: Cardiology Why: with repeat cbc/bmp Contact information: [address] [place] [address] [phone]              No Active Allergies  Consultations:  ID/cardiothoracic surgery/cardiology/general surgery   Procedures/Studies: CT Angio Chest PE W and/or Wo Contrast  Result Date: 02/07/2020 CLINICAL DATA:  Evaluate for pulmonary embolus. EXAM: CT ANGIOGRAPHY CHEST WITH CONTRAST TECHNIQUE: Multidetector CT imaging of the chest was performed using the standard protocol during bolus administration of intravenous contrast. Multiplanar CT image reconstructions and MIPs were obtained to evaluate the vascular anatomy. CONTRAST:  OMNIPAQUE IOHEXOL 350 MG/ML SOLN COMPARISON:  CT chest 12/22/2019 FINDINGS: Cardiovascular: Satisfactory opacification of the pulmonary arteries to the segmental level. No evidence of pulmonary embolism. Normal heart size.  No pericardial effusion. The right atrium and right ventricle appear increased in caliber and there is reflux of contrast  material into the IVC and hepatic veins consistent with passive venous congestion due to right heart failure. Mediastinum/Nodes: No enlarged mediastinal, hilar, or axillary lymph nodes. Thyroid gland, trachea, and esophagus demonstrate no significant findings. Lungs/Pleura: No pleural effusion identified. Multifocal bilateral peripheral predominant areas of nodularity, scarring, and architectural distortion identified consistent with sequelae of previous septic emboli. Most of the previously noted nodules demonstrate interval improvement from previous exam: Previous solid subpleural nodule within the lateral right upper lobe now appears cavitary and decreased in size measuring 2.3 x 1.3 cm, image 29/7. Previously this measured 2.4 x 2.1 cm. Nodule within the posterior left upper lobe measures 0.8 x 1.0 cm, image 40/7. Previously 1.6 x 1.4 cm. Previous solid nodule in the superior segment of left lower lobe measuring 1.8 x 1.1 cm, image 90/7 of the previous exam, has resolved in the interval. New cavitary lung nodule within the posterior right lower lobe measures 1.9 cm, image 90/7. Upper Abdomen: No acute abnormality. Musculoskeletal: No chest wall abnormality. No acute or significant osseous findings. Review of the MIP images confirms the above findings. IMPRESSION: 1. No evidence for acute pulmonary embolus. 2. Multifocal bilateral peripheral predominant areas of nodularity, scarring, and architectural distortion consistent with sequelae of previous septic emboli. Most of the previously noted nodules demonstrate interval improvement from previous exam. However, there is a new cavitary lung nodule within the posterior right lower lobe measuring 1.9 cm. 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 3. Enlarged right atrium and right ventricle with reflux of contrast material into the IVC and hepatic veins consistent with passive venous congestion due to right heart failure. Electronically Signed   By:  Signa Kell M.D.   On: 02/07/2020 09:57   MR THORACIC SPINE W WO CONTRAST  Result Date: 02/10/2020 CLINICAL DATA:  Septic emboli, back pain. EXAM: MRI THORACIC AND LUMBAR SPINE WITHOUT AND WITH CONTRAST TECHNIQUE: Multiplanar and multiecho pulse sequences of the thoracic and lumbar spine were obtained without and with intravenous contrast. CONTRAST:  6mL GADAVIST GADOBUTROL 1 MMOL/ML IV SOLN COMPARISON:  MRI of the thoracic spine January 14, 2020. FINDINGS: MRI THORACIC SPINE FINDINGS Alignment:  Physiologic. Vertebrae: No fracture, evidence of discitis, or bone lesion. No abnormal contrast enhancement. Cord:  Normal signal and morphology. Paraspinal and other soft tissues: Negative paraspinal soft tissues. Bilateral pleural effusion. Please refer to CT angiogram of the chest performed on February 07, 2020 for additional chest findings. Disc levels: No significant disc herniation, spinal canal or neural foraminal stenosis. MRI LUMBAR SPINE FINDINGS Segmentation:  Standard. Alignment:  Physiologic. Vertebrae: No fracture, evidence of discitis, or bone lesion. No abnormal contrast enhancement. Conus medullaris: Extends to the T12-L1 level and appears normal. Paraspinal and other soft tissues: No evidence of psoas or paravertebral muscle edema or abscess. Disc levels: Tiny left subarticular disc protrusion at L5-S1. No significant spinal canal or neural foraminal stenosis at any level. IMPRESSION: 1. No evidence of discitis-osteomyelitis or epidural abscess. 2. No significant spinal canal or neural foraminal stenosis at any level. 3. Bilateral pleural effusion. Please refer to CT angiogram of the chest performed on February 07, 2020 for additional chest findings. Electronically Signed   By: Baldemar Lenis M.D.   On: 02/10/2020 20:49   MR THORACIC SPINE W WO CONTRAST  Result Date: 01/14/2020 CLINICAL DATA:  Epidural abscess. History of IV drug abuse. Recent MRSA  bacteremia and endocarditis.  EXAM: MRI THORACIC AND LUMBAR SPINE WITHOUT AND WITH CONTRAST TECHNIQUE: Multiplanar and multiecho pulse sequences of the thoracic and lumbar spine were obtained without and with intravenous contrast. CONTRAST:  5.80mL GADAVIST GADOBUTROL 1 MMOL/ML IV SOLN COMPARISON:  None. FINDINGS: MRI THORACIC SPINE FINDINGS Alignment:  Normal Vertebrae: Normal bone marrow. Negative for fracture or mass. Negative for spinal infection. Cord: Normal spinal cord signal. No abnormal enhancement of the cord. No cord compression. No epidural abscess identified. Paraspinal and other soft tissues: Negative for paraspinous mass. No epidural abscess or fluid collection Patchy bilateral lung infiltrates in lung nodules compatible with infection. See chest CT report 12/22/2019 Disc levels: Normal disc spaces.  Negative for disc degeneration or infection. MRI LUMBAR SPINE FINDINGS Segmentation:  Normal Alignment:  Normal Vertebrae: Normal bone marrow. Negative for fracture, mass, or infection. Conus medullaris: Extends to the L1-2 level and appears normal. Paraspinal and other soft tissues: Negative for paraspinous mass or fluid collection. Negative for abscess. Disc levels: Small central and left-sided disc protrusion L5-S1. Mild flattening left S1 nerve root. Remaining disc spaces normal. IMPRESSION: No evidence of spinal infection in the thoracic or lumbar spine. No epidural abscess or diskitis identified. Small central and left-sided disc protrusion L5-S1. Electronically Signed   By: Marlan Palau M.D.   On: 01/14/2020 15:10   MR Lumbar Spine W Wo Contrast  Result Date: 02/10/2020 CLINICAL DATA:  Septic emboli, back pain. EXAM: MRI THORACIC AND LUMBAR SPINE WITHOUT AND WITH CONTRAST TECHNIQUE: Multiplanar and multiecho pulse sequences of the thoracic and lumbar spine were obtained without and with intravenous contrast. CONTRAST:  6mL GADAVIST GADOBUTROL 1 MMOL/ML IV SOLN COMPARISON:  MRI of the thoracic spine January 14, 2020.  FINDINGS: MRI THORACIC SPINE FINDINGS Alignment:  Physiologic. Vertebrae: No fracture, evidence of discitis, or bone lesion. No abnormal contrast enhancement. Cord:  Normal signal and morphology. Paraspinal and other soft tissues: Negative paraspinal soft tissues. Bilateral pleural effusion. Please refer to CT angiogram of the chest performed on February 07, 2020 for additional chest findings. Disc levels: No significant disc herniation, spinal canal or neural foraminal stenosis. MRI LUMBAR SPINE FINDINGS Segmentation:  Standard. Alignment:  Physiologic. Vertebrae: No fracture, evidence of discitis, or bone lesion. No abnormal contrast enhancement. Conus medullaris: Extends to the T12-L1 level and appears normal. Paraspinal and other soft tissues: No evidence of psoas or paravertebral muscle edema or abscess. Disc levels: Tiny left subarticular disc protrusion at L5-S1. No significant spinal canal or neural foraminal stenosis at any level. IMPRESSION: 1. No evidence of discitis-osteomyelitis or epidural abscess. 2. No significant spinal canal or neural foraminal stenosis at any level. 3. Bilateral pleural effusion. Please refer to CT angiogram of the chest performed on February 07, 2020 for additional chest findings. Electronically Signed   By: Baldemar Lenis M.D.   On: 02/10/2020 20:49   MR Lumbar Spine W Wo Contrast  Result Date: 01/14/2020 CLINICAL DATA:  Epidural abscess. History of IV drug abuse. Recent MRSA bacteremia and endocarditis. EXAM: MRI THORACIC AND LUMBAR SPINE WITHOUT AND WITH CONTRAST TECHNIQUE: Multiplanar and multiecho pulse sequences of the thoracic and lumbar spine were obtained without and with intravenous contrast. CONTRAST:  5.31mL GADAVIST GADOBUTROL 1 MMOL/ML IV SOLN COMPARISON:  None. FINDINGS: MRI THORACIC SPINE FINDINGS Alignment:  Normal Vertebrae: Normal bone marrow. Negative for fracture or mass. Negative for spinal infection. Cord: Normal spinal cord signal. No  abnormal enhancement of the cord. No cord compression. No epidural abscess identified. Paraspinal  and other soft tissues: Negative for paraspinous mass. No epidural abscess or fluid collection Patchy bilateral lung infiltrates in lung nodules compatible with infection. See chest CT report 12/22/2019 Disc levels: Normal disc spaces.  Negative for disc degeneration or infection. MRI LUMBAR SPINE FINDINGS Segmentation:  Normal Alignment:  Normal Vertebrae: Normal bone marrow. Negative for fracture, mass, or infection. Conus medullaris: Extends to the L1-2 level and appears normal. Paraspinal and other soft tissues: Negative for paraspinous mass or fluid collection. Negative for abscess. Disc levels: Small central and left-sided disc protrusion L5-S1. Mild flattening left S1 nerve root. Remaining disc spaces normal. IMPRESSION: No evidence of spinal infection in the thoracic or lumbar spine. No epidural abscess or diskitis identified. Small central and left-sided disc protrusion L5-S1. Electronically Signed   By: Marlan Palau M.D.   On: 01/14/2020 15:10   DG Chest Port 1 View  Result Date: 02/07/2020 CLINICAL DATA:  Questionable sepsis.  Evaluate for abnormality. EXAM: PORTABLE CHEST 1 VIEW COMPARISON:  01/05/2020 FINDINGS: The heart size appears within normal limits. No pleural effusion identified. Bilateral peripheral predominant scarring with multiple upper lobe predominant cystic lucencies likely representing postinflammatory/infectious pneumatoceles. IMPRESSION: Bilateral peripheral predominant scarring with multiple upper lobe predominant cystic lucencies likely representing postinflammatory/infectious pneumatoceles. Electronically Signed   By: Signa Kell M.D.   On: 02/07/2020 07:13   ECHOCARDIOGRAM COMPLETE  Result Date: 02/07/2020    ECHOCARDIOGRAM REPORT   Patient Name:   Alex [name] Date of Exam: 02/07/2020 Medical Rec #:  [id number]      Height:       71.0 in Accession #:    [id number]      Weight:       136.0 lb Date of Birth:  07/24/1985      BSA:          1.790 m Patient Age:    34 years       BP:           131/96 mmHg Patient Gender: M              HR:           56 bpm. Exam Location:  Inpatient Procedure: 2D Echo Indications:    Endocarditis I38  History:        Patient has prior history of Echocardiogram examinations, most                 recent 12/14/2019. CHF, Endocarditis; Risk Factors:Polysubstance                 Abuse.  Sonographer:    Thurman Coyer RDCS (AE) Referring Phys: [id number] Jae Dire  Sonographer Comments: Image acquisition challenging due to uncooperative patient. Unable to finish last few images due to poor patient compliance. IMPRESSIONS  1. Left ventricular ejection fraction, by estimation, is 60 to 65%. The left ventricle has normal function. The left ventricle has no regional wall motion abnormalities. There is mild left ventricular hypertrophy. Left ventricular diastolic parameters were normal. There is the interventricular septum is flattened in systole and diastole, consistent with right ventricular pressure and volume overload.  2. Coarse trabeculation of the RV apex. Right ventricular systolic function is mildly reduced. The right ventricular size is severely enlarged.  3. Right atrial size was severely dilated.  4. The mitral valve is abnormal. Trivial mitral valve regurgitation. There is mild late systolic prolapse of multiple scallops of the posterior leaflet of the mitral valve.  5. Mobile vegetations noted on the  posterior and anterior TV leaflets (measures 1.5 x 0.7 cm posterior and up to 2.21 x 0.5 cm on the anterior leaflet).. The tricuspid valve is abnormal. Tricuspid valve regurgitation is severe.  6. The aortic valve is tricuspid. Aortic valve regurgitation is not visualized.  7. The inferior vena cava is dilated in size with <50% respiratory variability, suggesting right atrial pressure of 15 mmHg. Comparison(s): 12/14/2019: LVEF 60-65%, prior  vegetations noted on the TV. FINDINGS  Left Ventricle: Left ventricular ejection fraction, by estimation, is 60 to 65%. The left ventricle has normal function. The left ventricle has no regional wall motion abnormalities. The left ventricular internal cavity size was normal in size. There is  mild left ventricular hypertrophy. The interventricular septum is flattened in systole and diastole, consistent with right ventricular pressure and volume overload. Left ventricular diastolic parameters were normal. Right Ventricle: Coarse trabeculation of the RV apex. The right ventricular size is severely enlarged. No increase in right ventricular wall thickness. Right ventricular systolic function is mildly reduced. Left Atrium: Left atrial size was normal in size. Right Atrium: Right atrial size was severely dilated. Pericardium: There is no evidence of pericardial effusion. Mitral Valve: The mitral valve is abnormal. There is mild late systolic prolapse of multiple scallops of the posterior leaflet of the mitral valve. There is mild thickening of the mitral valve leaflet(s). Trivial mitral valve regurgitation. Tricuspid Valve: Mobile vegetations noted on the posterior and anterior TV leaflets (measures 1.5 x 0.7 cm posterior and up to 2.21 x 0.5 cm on the anterior leaflet). The tricuspid valve is abnormal. Tricuspid valve regurgitation is severe. The flow in the hepatic veins is reversed during ventricular systole. Aortic Valve: The aortic valve is tricuspid. Aortic valve regurgitation is not visualized. Pulmonic Valve: The pulmonic valve was grossly normal. Pulmonic valve regurgitation is trivial. Aorta: The aortic root and ascending aorta are structurally normal, with no evidence of dilitation. Venous: The inferior vena cava is dilated in size with less than 50% respiratory variability, suggesting right atrial pressure of 15 mmHg. IAS/Shunts: There is left bowing of the interatrial septum, suggestive of elevated right  atrial pressure. No atrial level shunt detected by color flow Doppler.  LEFT VENTRICLE PLAX 2D LVIDd:         4.40 cm  Diastology LVIDs:         3.10 cm  LV e' medial:    13.10 cm/s LV PW:         1.10 cm  LV E/e' medial:  4.4 LV IVS:        1.30 cm  LV e' lateral:   16.30 cm/s LVOT diam:     2.10 cm  LV E/e' lateral: 3.5 LV SV:         39 LV SV Index:   22 LVOT Area:     3.46 cm  LEFT ATRIUM           Index       RIGHT ATRIUM           Index LA diam:      3.90 cm 2.18 cm/m  RA Area:     33.30 cm LA Vol (A4C): 33.5 ml 18.72 ml/m RA Volume:   125.00 ml 69.84 ml/m  AORTIC VALVE LVOT Vmax:   59.40 cm/s LVOT Vmean:  38.700 cm/s LVOT VTI:    0.112 m  AORTA Ao Root diam: 2.90 cm MITRAL VALVE MV Area (PHT): 2.76 cm    SHUNTS MV Decel Time: 275 msec  Systemic VTI:  0.11 m MV E velocity: 57.00 cm/s  Systemic Diam: 2.10 cm MV A velocity: 37.30 cm/s MV E/A ratio:  1.53 Zoila Shutter MD Electronically signed by Zoila Shutter MD Signature Date/Time: 02/07/2020/4:28:01 PM    Final        Subjective: Patient seen and examined at bedside.  Still complains of back pain.  No overnight fever or vomiting reported.  Denies worsening shortness of breath or chest pain.  Discharge Exam: Vitals:   02/11/20 2205 02/12/20 0609  BP: 110/78 (!) 122/100  Pulse: 61 (!) 52  Resp: 18 18  Temp: 97.6 F (36.4 C) 97.8 F (36.6 C)  SpO2: 97% 99%    General: Pt is alert, awake, not in acute distress.  Poor historian.  Looks chronically ill. Cardiovascular: Mildly bradycardic, S1/S2 + Respiratory: bilateral decreased breath sounds at bases with some scattered crackles Abdominal: Soft, NT, ND, bowel sounds + Extremities: no edema, no cyanosis.  Improving lower extremity rash present.    The results of significant diagnostics from this hospitalization (including imaging, microbiology, ancillary and laboratory) are listed below for reference.     Microbiology: Recent Results (from the past 240 hour(s))  Blood  Culture (routine x 2)     Status: None   Collection Time: 02/07/20  6:23 AM   Specimen: BLOOD RIGHT HAND  Result Value Ref Range Status   Specimen Description   Final    BLOOD RIGHT HAND Performed at [facility], [address], [place], [place] [address]    Special Requests   Final    BOTTLES DRAWN AEROBIC AND ANAEROBIC Blood Culture results may not be optimal due to an inadequate volume of blood received in culture bottles Performed at [facility], [address] [place], [place] [address]    Culture   Final    NO GROWTH 5 DAYS Performed at [facility], [address] [place], [place] [address]    Report Status 02/12/2020 FINAL  Final  Blood Culture (routine x 2)     Status: None   Collection Time: 02/07/20  6:23 AM   Specimen: BLOOD RIGHT FOREARM  Result Value Ref Range Status   Specimen Description   Final    BLOOD RIGHT FOREARM Performed at [facility], [address], [place], [place] [address]    Special Requests   Final    BOTTLES DRAWN AEROBIC AND ANAEROBIC Blood Culture adequate volume Performed at [facility], [address] [place], [place] [address]    Culture   Final    NO GROWTH 5 DAYS Performed at [facility], [address] [place], [place] [address]    Report Status 02/12/2020 FINAL  Final  Urine culture     Status: None   Collection Time: 02/07/20  6:23 AM   Specimen: In/Out Cath Urine  Result Value Ref Range Status   Specimen Description   Final    IN/OUT CATH URINE Performed at [facility], [address], [place], [place] [address]    Special Requests   Final    URINE, RANDOM Performed at [facility], [address], [place], [place] [address]    Culture   Final    NO GROWTH Performed at [facility], [address], [place], [place] [address]    Report Status 02/08/2020 FINAL  Final  Resp Panel by  RT-PCR (Flu A&B, Covid) Nasopharyngeal Swab  Status: None   Collection Time: 02/07/20  6:23 AM   Specimen: Nasopharyngeal Swab; Nasopharyngeal(NP) swabs in vial transport medium  Result Value Ref Range Status   SARS Coronavirus 2 by RT PCR NEGATIVE NEGATIVE Final    Comment: (NOTE) SARS-CoV-2 target nucleic acids are NOT DETECTED.  The SARS-CoV-2 RNA is generally detectable in upper respiratory specimens during the acute phase of infection. The lowest concentration of SARS-CoV-2 viral copies this assay can detect is 138 copies/mL. A negative result does not preclude SARS-Cov-2 infection and should not be used as the sole basis for treatment or other patient management decisions. A negative result may occur with  improper specimen collection/handling, submission of specimen other than nasopharyngeal swab, presence of viral mutation(s) within the areas targeted by this assay, and inadequate number of viral copies(<138 copies/mL). A negative result must be combined with clinical observations, patient history, and epidemiological information. The expected result is Negative.  Fact Sheet for Patients:  BloggerCourse.com  Fact Sheet for Healthcare Providers:  SeriousBroker.it  This test is no t yet approved or cleared by the [place] FDA and  has been authorized for detection and/or diagnosis of SARS-CoV-2 by FDA under an Emergency Use Authorization (EUA). This EUA will remain  in effect (meaning this test can be used) for the duration of the COVID-19 declaration under Section 564(b)(1) of the Act, 21 U.S.C.section 360bbb-3(b)(1), unless the authorization is terminated  or revoked sooner.       Influenza A by PCR NEGATIVE NEGATIVE Final   Influenza B by PCR NEGATIVE NEGATIVE Final    Comment: (NOTE) The Xpert Xpress SARS-CoV-2/FLU/RSV plus assay is intended as an aid in the diagnosis of influenza from Nasopharyngeal swab  specimens and should not be used as a sole basis for treatment. Nasal washings and aspirates are unacceptable for Xpert Xpress SARS-CoV-2/FLU/RSV testing.  Fact Sheet for Patients: BloggerCourse.com  Fact Sheet for Healthcare Providers: SeriousBroker.it  This test is not yet approved or cleared by the [place] FDA and has been authorized for detection and/or diagnosis of SARS-CoV-2 by FDA under an Emergency Use Authorization (EUA). This EUA will remain in effect (meaning this test can be used) for the duration of the COVID-19 declaration under Section 564(b)(1) of the Act, 21 U.S.C. section 360bbb-3(b)(1), unless the authorization is terminated or revoked.  Performed at [facility], [address] [place], [place] [address]      Labs: BNP (last 3 results) Recent Labs    12/14/19 0810 02/09/20 0509  BNP 526.1* 395.2*   Basic Metabolic Panel: Recent Labs  Lab 02/08/20 0415 02/08/20 0415 02/09/20 0509 02/10/20 0347 02/11/20 0430 02/11/20 0911 02/12/20 0446  NA 135  --  136 136 135  --  136  K 4.6   < > 4.7 4.5 5.6* 5.5* 4.9  CL 104  --  106 103 103  --  104  CO2 23  --  24 24 23   --  24  GLUCOSE 95  --  100* 86 158*  --  87  BUN 19  --  23* 19 21*  --  26*  CREATININE 1.02  --  1.22 1.24 1.18  --  1.19  CALCIUM 8.2*  --  8.2* 8.2* 8.7*  --  8.4*  MG  --   --  1.7 1.6* 1.6*  --  2.1  PHOS  --   --  3.9 4.4  --   --   --    < > = values  in this interval not displayed.   Liver Function Tests: Recent Labs  Lab 02/07/20 0623 02/09/20 0509 02/10/20 0347  AST 59* 48* 44*  ALT 51* 47* 51*  ALKPHOS 218* 187* 191*  BILITOT 0.8 0.6 0.7  PROT 7.5 5.9* 6.2*  ALBUMIN 3.2* 2.6* 2.7*   No results for input(s): LIPASE, AMYLASE in the last 168 hours. No results for input(s): AMMONIA in the last 168 hours. CBC: Recent Labs  Lab 02/07/20 0623 02/07/20 0623 02/08/20 0415 02/09/20 0509  02/10/20 0347 02/11/20 0430 02/12/20 0446  WBC 8.6   < > 7.2 7.1 7.8 7.7 9.9  NEUTROABS 6.0  --   --   --  5.2  --   --   HGB 10.4*   < > 9.2* 9.9* 10.1* 9.9* 9.8*  HCT 35.0*   < > 30.5* 32.9* 33.7* 32.8* 33.3*  MCV 89.3   < > 86.2 87.3 86.6 86.5 88.8  PLT 212   < > 176 189 197 185 181   < > = values in this interval not displayed.   Cardiac Enzymes: No results for input(s): CKTOTAL, CKMB, CKMBINDEX, TROPONINI in the last 168 hours. BNP: Invalid input(s): POCBNP CBG: No results for input(s): GLUCAP in the last 168 hours. D-Dimer No results for input(s): DDIMER in the last 72 hours. Hgb A1c No results for input(s): HGBA1C in the last 72 hours. Lipid Profile No results for input(s): CHOL, HDL, LDLCALC, TRIG, CHOLHDL, LDLDIRECT in the last 72 hours. Thyroid function studies No results for input(s): TSH, T4TOTAL, T3FREE, THYROIDAB in the last 72 hours.  Invalid input(s): FREET3 Anemia work up No results for input(s): VITAMINB12, FOLATE, FERRITIN, TIBC, IRON, RETICCTPCT in the last 72 hours. Urinalysis    Component Value Date/Time   COLORURINE YELLOW 02/07/2020 0623   APPEARANCEUR CLEAR 02/07/2020 0623   LABSPEC 1.015 02/07/2020 0623   PHURINE 6.0 02/07/2020 0623   GLUCOSEU NEGATIVE 02/07/2020 0623   HGBUR LARGE (A) 02/07/2020 0623   BILIRUBINUR NEGATIVE 02/07/2020 0623   KETONESUR NEGATIVE 02/07/2020 0623   PROTEINUR 30 (A) 02/07/2020 0623   NITRITE NEGATIVE 02/07/2020 0623   LEUKOCYTESUR NEGATIVE 02/07/2020 1610   Sepsis Labs Invalid input(s): PROCALCITONIN,  WBC,  LACTICIDVEN Microbiology Recent Results (from the past 240 hour(s))  Blood Culture (routine x 2)     Status: None   Collection Time: 02/07/20  6:23 AM   Specimen: BLOOD RIGHT HAND  Result Value Ref Range Status   Specimen Description   Final    BLOOD RIGHT HAND Performed at [facility], [address] [place], [place] [address]    Special Requests   Final    BOTTLES DRAWN AEROBIC  AND ANAEROBIC Blood Culture results may not be optimal due to an inadequate volume of blood received in culture bottles Performed at [facility], [address], [place], [place] [address]    Culture   Final    NO GROWTH 5 DAYS Performed at [facility], [address], [place], [place] [address]    Report Status 02/12/2020 FINAL  Final  Blood Culture (routine x 2)     Status: None   Collection Time: 02/07/20  6:23 AM   Specimen: BLOOD RIGHT FOREARM  Result Value Ref Range Status   Specimen Description   Final    BLOOD RIGHT FOREARM Performed at [facility], [address] [place], [place] [address]    Special Requests   Final    BOTTLES DRAWN  AEROBIC AND ANAEROBIC Blood Culture adequate volume Performed at [facility], [address] [place], [place] [address]    Culture   Final    NO GROWTH 5 DAYS Performed at [facility], [address], [place], [place] [address]    Report Status 02/12/2020 FINAL  Final  Urine culture     Status: None   Collection Time: 02/07/20  6:23 AM   Specimen: In/Out Cath Urine  Result Value Ref Range Status   Specimen Description   Final    IN/OUT CATH URINE Performed at [facility], [address] [place], [place] [address]    Special Requests   Final    URINE, RANDOM Performed at [facility], [address] [place], [place] [address]    Culture   Final    NO GROWTH Performed at [facility], [address], [place], [place] [address]    Report Status 02/08/2020 FINAL  Final  Resp Panel by RT-PCR (Flu A&B, Covid) Nasopharyngeal Swab     Status: None   Collection Time: 02/07/20  6:23 AM   Specimen: Nasopharyngeal Swab; Nasopharyngeal(NP) swabs in vial transport medium  Result Value Ref Range Status   SARS Coronavirus 2 by RT PCR NEGATIVE NEGATIVE Final    Comment: (NOTE) SARS-CoV-2 target nucleic acids are NOT  DETECTED.  The SARS-CoV-2 RNA is generally detectable in upper respiratory specimens during the acute phase of infection. The lowest concentration of SARS-CoV-2 viral copies this assay can detect is 138 copies/mL. A negative result does not preclude SARS-Cov-2 infection and should not be used as the sole basis for treatment or other patient management decisions. A negative result may occur with  improper specimen collection/handling, submission of specimen other than nasopharyngeal swab, presence of viral mutation(s) within the areas targeted by this assay, and inadequate number of viral copies(<138 copies/mL). A negative result must be combined with clinical observations, patient history, and epidemiological information. The expected result is Negative.  Fact Sheet for Patients:  BloggerCourse.com  Fact Sheet for Healthcare Providers:  SeriousBroker.it  This test is no t yet approved or cleared by the [place] FDA and  has been authorized for detection and/or diagnosis of SARS-CoV-2 by FDA under an Emergency Use Authorization (EUA). This EUA will remain  in effect (meaning this test can be used) for the duration of the COVID-19 declaration under Section 564(b)(1) of the Act, 21 U.S.C.section 360bbb-3(b)(1), unless the authorization is terminated  or revoked sooner.       Influenza A by PCR NEGATIVE NEGATIVE Final   Influenza B by PCR NEGATIVE NEGATIVE Final    Comment: (NOTE) The Xpert Xpress SARS-CoV-2/FLU/RSV plus assay is intended as an aid in the diagnosis of influenza from Nasopharyngeal swab specimens and should not be used as a sole basis for treatment. Nasal washings and aspirates are unacceptable for Xpert Xpress SARS-CoV-2/FLU/RSV testing.  Fact Sheet for Patients: BloggerCourse.com  Fact Sheet for Healthcare Providers: SeriousBroker.it  This test is not yet  approved or cleared by the [place] FDA and has been authorized for detection and/or diagnosis of SARS-CoV-2 by FDA under an Emergency Use Authorization (EUA). This EUA will remain in effect (meaning this test can be used) for the duration of the COVID-19 declaration under Section 564(b)(1) of the Act, 21 U.S.C. section 360bbb-3(b)(1), unless the authorization is terminated or revoked.  Performed at [facility], [address] [place], [place] [address]  Time coordinating discharge: 35 minutes  SIGNED:   Glade LloydKshitiz Dajanae Brophy, MD  [facility] 02/12/2020, 10:43 AM

## 2020-02-12 NOTE — Progress Notes (Signed)
Patient informed of his discharge, he requested Cardiology to discuss his infection then later requested I&D, states he feels because he is and IV drug user he is being discharged to early. I did allow patient to express his concerns and informed him that his MD has deemed him medically stable to continue po antibiotics and he can do that outside of hospital setting. Patient aware he will be returning to jail, officer remains in room and discharge paperwork as well as prescriptions given to the officer. IV removed from patients left hand, site WDL. Tele removed. Personal clothes/items returned to patient from locked cabinet. Instructed patient/officer to notify staff when ride is here and transport out of hospital via wheelchair will be provided. Verbalized understanding

## 2020-02-15 ENCOUNTER — Emergency Department (HOSPITAL_COMMUNITY): Payer: Self-pay

## 2020-02-15 ENCOUNTER — Encounter (HOSPITAL_COMMUNITY): Payer: Self-pay

## 2020-02-15 ENCOUNTER — Emergency Department (HOSPITAL_COMMUNITY)
Admission: EM | Admit: 2020-02-15 | Discharge: 2020-02-16 | Disposition: A | Discharge status: Home / Self Care | Payer: Self-pay | Attending: Emergency Medicine | Admitting: Emergency Medicine

## 2020-02-15 DIAGNOSIS — F17210 Nicotine dependence, cigarettes, uncomplicated: Secondary | ICD-10-CM | POA: Insufficient documentation

## 2020-02-15 DIAGNOSIS — Z8679 Personal history of other diseases of the circulatory system: Secondary | ICD-10-CM

## 2020-02-15 DIAGNOSIS — I509 Heart failure, unspecified: Secondary | ICD-10-CM | POA: Insufficient documentation

## 2020-02-15 DIAGNOSIS — R0602 Shortness of breath: Secondary | ICD-10-CM | POA: Insufficient documentation

## 2020-02-15 DIAGNOSIS — J45909 Unspecified asthma, uncomplicated: Secondary | ICD-10-CM | POA: Insufficient documentation

## 2020-02-15 DIAGNOSIS — R079 Chest pain, unspecified: Secondary | ICD-10-CM | POA: Insufficient documentation

## 2020-02-15 DIAGNOSIS — R531 Weakness: Secondary | ICD-10-CM | POA: Insufficient documentation

## 2020-02-15 LAB — CBC WITH DIFFERENTIAL/PLATELET
Abs Immature Granulocytes: 0.03 10*3/uL (ref 0.00–0.07)
Basophils Absolute: 0.1 10*3/uL (ref 0.0–0.1)
Basophils Relative: 1 %
Eosinophils Absolute: 0 10*3/uL (ref 0.0–0.5)
Eosinophils Relative: 1 %
HCT: 37.9 % — ABNORMAL LOW (ref 39.0–52.0)
Hemoglobin: 11.7 g/dL — ABNORMAL LOW (ref 13.0–17.0)
Immature Granulocytes: 0 %
Lymphocytes Relative: 30 %
Lymphs Abs: 2.6 10*3/uL (ref 0.7–4.0)
MCH: 26.7 pg (ref 26.0–34.0)
MCHC: 30.9 g/dL (ref 30.0–36.0)
MCV: 86.3 fL (ref 80.0–100.0)
Monocytes Absolute: 0.6 10*3/uL (ref 0.1–1.0)
Monocytes Relative: 7 %
Neutro Abs: 5.4 10*3/uL (ref 1.7–7.7)
Neutrophils Relative %: 61 %
Platelets: 264 10*3/uL (ref 150–400)
RBC: 4.39 MIL/uL (ref 4.22–5.81)
RDW: 19.2 % — ABNORMAL HIGH (ref 11.5–15.5)
WBC: 8.8 10*3/uL (ref 4.0–10.5)
nRBC: 0 % (ref 0.0–0.2)

## 2020-02-15 LAB — URINALYSIS, ROUTINE W REFLEX MICROSCOPIC
Bacteria, UA: NONE SEEN
Bilirubin Urine: NEGATIVE
Glucose, UA: NEGATIVE mg/dL
Ketones, ur: NEGATIVE mg/dL
Leukocytes,Ua: NEGATIVE
Nitrite: NEGATIVE
Protein, ur: NEGATIVE mg/dL
RBC / HPF: >50 RBC/hpf — ABNORMAL HIGH (ref 0–5)
Specific Gravity, Urine: 1.015 (ref 1.005–1.030)
pH: 8 (ref 5.0–8.0)

## 2020-02-15 LAB — COMPREHENSIVE METABOLIC PANEL
ALT: 70 U/L — ABNORMAL HIGH (ref 0–44)
AST: 47 U/L — ABNORMAL HIGH (ref 15–41)
Albumin: 3.2 g/dL — ABNORMAL LOW (ref 3.5–5.0)
Alkaline Phosphatase: 194 U/L — ABNORMAL HIGH (ref 38–126)
Anion gap: 11 (ref 5–15)
BUN: 12 mg/dL (ref 6–20)
CO2: 22 mmol/L (ref 22–32)
Calcium: 9.1 mg/dL (ref 8.9–10.3)
Chloride: 105 mmol/L (ref 98–111)
Creatinine, Ser: 0.83 mg/dL (ref 0.61–1.24)
GFR, Estimated: >60 mL/min (ref >60)
Glucose, Bld: 96 mg/dL (ref 70–99)
Potassium: 4.1 mmol/L (ref 3.5–5.1)
Sodium: 138 mmol/L (ref 135–145)
Total Bilirubin: 0.5 mg/dL (ref 0.3–1.2)
Total Protein: 7.3 g/dL (ref 6.5–8.1)

## 2020-02-15 LAB — PROTIME-INR
INR: 1.3 — ABNORMAL HIGH (ref 0.8–1.2)
Prothrombin Time: 15.8 seconds — ABNORMAL HIGH (ref 11.4–15.2)

## 2020-02-15 LAB — LACTIC ACID, PLASMA: Lactic Acid, Venous: 1.7 mmol/L (ref 0.5–1.9)

## 2020-02-15 MED ORDER — IBUPROFEN 400 MG PO TABS
400.0000 mg | ORAL_TABLET | Freq: Once | ORAL | Status: AC | PRN
  Administered 2020-02-15: 400 mg via ORAL
  Filled 2020-02-15: qty 1

## 2020-02-15 NOTE — ED Triage Notes (Signed)
Pt comes via [facility] from [facility], was recently diagnosed with endocarditis in September. CP started again on Sat, some SOB, worse with sitting up, left [facility] on Friday

## 2020-02-16 ENCOUNTER — Other Ambulatory Visit: Payer: Self-pay

## 2020-02-16 MED ORDER — OXYCODONE-ACETAMINOPHEN 5-325 MG PO TABS
2.0000 | ORAL_TABLET | Freq: Once | ORAL | Status: AC
  Administered 2020-02-16: 2 via ORAL
  Filled 2020-02-16: qty 2

## 2020-02-16 MED ORDER — KETOROLAC TROMETHAMINE 60 MG/2ML IM SOLN
60.0000 mg | Freq: Once | INTRAMUSCULAR | Status: AC
  Administered 2020-02-16: 60 mg via INTRAMUSCULAR
  Filled 2020-02-16: qty 2

## 2020-02-16 NOTE — ED Provider Notes (Signed)
[facility]   CSN: [id number] Arrival date & time: 02/15/20  2131     History Chief Complaint  Patient presents with  . Chest Pain    Alex [name] is a 34 y.o. male.  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  Patient has a damaged tricuspid valve.  During his recent hospitalization, he received intravenous vancomycin along with a Oritavancin and was discharged with doxycycline.  Patient was discharged to jail on Friday.  He returns today stating that he is having pain in his chest and difficulty breathing.  He denies any fevers or chills.  He describes weakness.  The history is provided by the patient.  Chest Pain Pain location:  Substernal area Pain quality: sharp   Pain radiates to:  Does not radiate Pain severity:  Moderate Timing:  Constant Progression:  Unchanged Chronicity:  Recurrent Relieved by:  Nothing Worsened by:  Nothing      Past Medical History:  Diagnosis Date  . ADHD (attention deficit hyperactivity disorder)   . Allergy   . Asthma   . CHF (congestive heart failure) (HCC)   . Depressed   . 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 ETOH abuse   . History of suicidal tendencies   . Polysubstance abuse [facility](HCC)     Patient Active Problem List   Diagnosis Date Noted  . Nonspecific chest pain   . Septic embolism (HCC) 02/07/2020  . Multifocal pneumonia   . MRSA bacteremia   . Vasculitis (HCC)   . Malnutrition of moderate degree 12/25/2019  . Pleural effusion, bilateral   . Chronic hepatitis C without hepatic coma (HCC)   . Infective myositis   . Acute septic pulmonary embolism (HCC)   . Goals of care, counseling/discussion   . Palliative care encounter   . Endocarditis 12/14/2019  . Acute CHF (congestive heart failure) (HCC) 12/14/2019  . Anemia 12/14/2019  . Agitation 08/19/2019  . Endocarditis of tricuspid valve   . IVDU  (intravenous drug user)   . Sepsis (HCC) 12/28/2018  . Alcohol use disorder, severe, dependence (HCC) 09/11/2018  . Substance induced mood disorder (HCC) 04/24/2016  . Polysubstance dependence (HCC) 04/23/2016  . Major depressive disorder, single episode, severe without psychotic features (HCC) 04/23/2016  . 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 Tobacco use disorder 02/01/2014  . ADHD (attention deficit hyperactivity disorder)     Past Surgical History:  Procedure Laterality Date  . RADIOLOGY WITH ANESTHESIA N/A 12/18/2019   Procedure: MRI-THORACIC MRI WITH AND WITHOUT CONSTRAST;  Surgeon: Radiologist, Medication, MD;  Location: [facility] OR;  Service: Radiology;  Laterality: N/A;  . RADIOLOGY WITH ANESTHESIA N/A 01/14/2020   Procedure: MRI WITH ANESTHESIA OF THORACIC SPINE AND LUMBAR SPINE;  Surgeon: Radiologist, Medication, MD;  Location: [facility] OR;  Service: Radiology;  Laterality: N/A;  . RADIOLOGY WITH ANESTHESIA N/A 02/10/2020   Procedure: MRI WITH ANESTHESIA OF THORACIC SPINE WITH AND WIHTOUT AND LUMBAR WITH AND WITHOUT CONTRAST;  Surgeon: Radiologist, Medication, MD;  Location: [facility] OR;  Service: Radiology;  Laterality: N/A;  . TEE WITHOUT CARDIOVERSION N/A 12/31/2018   Procedure: TRANSESOPHAGEAL ECHOCARDIOGRAM (TEE);  Surgeon: Thurmon Fairroitoru, Mihai, MD;  [facility];  Service: Cardiovascular;  Laterality: N/A;       Family History  Problem Relation Age of Onset  . COPD Other     Social History   Tobacco Use  .  Smoking status: Current Every Day Smoker    Packs/day: 1.00    Years: 10.00    Pack years: 10.00    Types: Cigarettes  . Smokeless tobacco: Never Used  Vaping Use  . Vaping Use: Never used  Substance Use Topics  . Alcohol use: Yes    Alcohol/week: 12.0 standard drinks    Types: 12 Cans of beer per week    Comment: 12 beers daily  . Drug use: Yes    Frequency: 6.0 times per week    Types: Marijuana, Cocaine, Heroin,  Fentanyl, IV    Comment: daily use cocaine (snort)    Home Medications Prior to Admission medications   Medication Sig Start Date End Date Taking? Authorizing Provider  acetaminophen (TYLENOL) 325 MG tablet Take 2 tablets (650 mg total) by mouth every 4 (four) hours as needed for headache or mild pain. 01/19/20   Arrien, York Ram, MD  albuterol (PROVENTIL HFA) [phone] Base) MCG/ACT inhaler Inhale 1-2 puffs into the lungs every 4 (four) hours as needed for wheezing or shortness of breath. 09/08/18   Wieters, Hallie C, PA-C  doxycycline (ADOXA) 100 MG tablet Take 1 tablet (100 mg total) by mouth 2 (two) times daily for 84 doses. Begin taking 12/10 02/12/20 03/25/20  Glade Lloyd, MD  furosemide (LASIX) 20 MG tablet Take 1 tablet (20 mg total) by mouth daily. 02/12/20   Glade Lloyd, MD  oxyCODONE (OXY IR/ROXICODONE) 5 MG immediate release tablet Take 1 tablet (5 mg total) by mouth every 6 (six) hours as needed for severe pain. 02/12/20   Glade Lloyd, MD  traMADol (ULTRAM) 50 MG tablet Take 1 tablet (50 mg total) by mouth every 6 (six) hours as needed for moderate pain. 02/12/20   Glade Lloyd, MD  ipratropium-albuterol (DUONEB) 0.5-2.5 (3) MG/3ML SOLN Take 3 mLs by nebulization every 6 (six) hours as needed (wheezing). Patient not taking: Reported on 12/28/2018 04/14/18 12/28/18  Elpidio Anis, PA-C  montelukast (SINGULAIR) 10 MG tablet Take 1 tablet (10 mg total) by mouth at bedtime. Patient not taking: Reported on 12/28/2018 07/25/17 12/28/18  Georgetta Haber, NP  traZODone (DESYREL) 50 MG tablet Take 1 tablet (50 mg total) by mouth at bedtime and may repeat dose one time if needed. Patient not taking: Reported on 07/05/2018 04/29/16 12/28/18  Oneta Rack, NP    Allergies    Vancomycin  Review of Systems   Review of Systems  Cardiovascular: Positive for chest pain.  All other systems reviewed and are negative.   Physical Exam Updated Vital Signs BP 127/90   Pulse 74   Temp  98.1 F (36.7 C) (Oral)   Resp (!) 23   Ht 6' (1.829 m)   Wt 68.9 kg   SpO2 100%   BMI 20.61 kg/m   Physical Exam Vitals and nursing note reviewed.  Constitutional:      General: He is not in acute distress.    Appearance: He is well-developed. He is not diaphoretic.  HENT:     Head: Normocephalic and atraumatic.  Cardiovascular:     Rate and Rhythm: Normal rate and regular rhythm.     Heart sounds: Murmur heard.  No friction rub.  Pulmonary:     Effort: Pulmonary effort is normal. No respiratory distress.     Breath sounds: Normal breath sounds. No wheezing or rales.  Abdominal:     General: Bowel sounds are normal. There is no distension.     Palpations: Abdomen is soft.  Tenderness: There is no abdominal tenderness.  Musculoskeletal:        General: Normal range of motion.     Cervical back: Normal range of motion and neck supple.     Right lower leg: No tenderness. No edema.     Left lower leg: No tenderness. No edema.  Skin:    General: Skin is warm and dry.  Neurological:     Mental Status: He is alert and oriented to person, place, and time.     Coordination: Coordination normal.     ED Results / Procedures / Treatments   Labs (all labs ordered are listed, but only abnormal results are displayed) Labs Reviewed  COMPREHENSIVE METABOLIC PANEL - Abnormal; Notable for the following components:      Result Value   Albumin 3.2 (*)    AST 47 (*)    ALT 70 (*)    Alkaline Phosphatase 194 (*)    All other components within normal limits  CBC WITH DIFFERENTIAL/PLATELET - Abnormal; Notable for the following components:   Hemoglobin 11.7 (*)    HCT 37.9 (*)    RDW 19.2 (*)    All other components within normal limits  PROTIME-INR - Abnormal; Notable for the following components:   Prothrombin Time 15.8 (*)    INR 1.3 (*)    All other components within normal limits  URINALYSIS, ROUTINE W REFLEX MICROSCOPIC - Abnormal; Notable for the following components:    Hgb urine dipstick MODERATE (*)    RBC / HPF >50 (*)    All other components within normal limits  CULTURE, BLOOD (ROUTINE X 2)  CULTURE, BLOOD (ROUTINE X 2)  LACTIC ACID, PLASMA    EKG EKG Interpretation  Date/Time:  Monday February 15 2020 21:39:40 EST Ventricular Rate:  81 PR Interval:  176 QRS Duration: 82 QT Interval:  376 QTC Calculation: 436 R Axis:   104 Text Interpretation: Sinus rhythm Rightward axis Nonspecific T wave abnormality Abnormal ECG Confirmed by Geoffery Lyons ([id number]) on 02/16/2020 11:29:01 AM   Radiology DG Chest 2 View  Result Date: 02/15/2020 CLINICAL DATA:  Chest pain and shortness of breath. EXAM: CHEST - 2 VIEW COMPARISON:  February 07, 2020 FINDINGS: Multiple small, stable ill-defined areas of focal scarring and/or atelectasis are again seen. There is no evidence of a pleural effusion or pneumothorax. The cardiac silhouette is borderline in size. The visualized skeletal structures are unremarkable. IMPRESSION: Multiple stable areas of focal scarring and/or atelectasis. Sequelae associated with chronic septic emboli cannot be excluded. Correlation with follow-up chest CT is recommended. Electronically Signed   By: Aram Candela M.D.   On: 02/15/2020 23:48    Procedures Procedures (including critical care time)  Medications Ordered in ED Medications  ibuprofen (ADVIL) tablet 400 mg (400 mg Oral Given 02/15/20 2143)    ED Course  I have reviewed the triage vital signs and the nursing notes.  Pertinent labs & imaging results that were available during my care of the patient were reviewed by me and considered in my medical decision making (see chart for details).    MDM Rules/Calculators/A&P  Patient is a 34 year old male with history of endocarditis related to IV drug abuse.  He is brought from jail for evaluation of chest pain and shortness of breath.  He was just discharged on Friday from [facility] where he was admitted after receiving IV  antibiotics.  He was felt not to be a candidate for valve replacement given his history of ongoing drug  abuse.  On his physical exam, patient is in no acute distress.  He is awake, alert, and breathing normally.  His oxygen saturations are 100%, blood pressure is not elevated, heart rate is normal, and there is no leg edema or rales on his lung exam.  Patient is displaying no overt signs of heart failure.  His chest x-ray shows chronic findings consistent with prior septic emboli, but no evidence for CHF.  He has no white cell count and no fever.  Remainder of his laboratory studies are essentially unremarkable and unchanged from discharge 4 days ago.  At this point, I see no indication for further testing.  He is showing no hypoxia and no evidence for heart failure.  I have obtained an additional set of blood cultures which are currently pending.  Care was discussed with Dr. Daiva Eves from infectious disease who agrees with my assessment that no further intervention is indicated and that there is no indication to readmit him.  [facility] to be [facility] to the jail.  He is complaining of pain for which she will receive Toradol and 2 Percocet here.  Final Clinical Impression(s) / ED Diagnoses Final diagnoses:  None    Rx / DC Orders ED Discharge Orders    None       Geoffery Lyons, MD 02/16/20 1133

## 2020-02-16 NOTE — Discharge Instructions (Addendum)
Follow-up with your physicians as recommended during your previous admission.  Return to the ER for new and/or worsening symptoms.

## 2020-02-16 NOTE — ED Notes (Signed)
Officer remains at pt bedside.

## 2020-02-20 LAB — CULTURE, BLOOD (ROUTINE X 2)
Culture: NO GROWTH
Culture: NO GROWTH
Special Requests: ADEQUATE
Special Requests: ADEQUATE

## 2020-02-22 ENCOUNTER — Ambulatory Visit: Payer: Self-pay | Admitting: Internal Medicine

## 2020-02-24 ENCOUNTER — Ambulatory Visit: Payer: Self-pay | Admitting: Internal Medicine

## 2020-02-25 ENCOUNTER — Other Ambulatory Visit: Payer: Self-pay

## 2020-02-25 ENCOUNTER — Encounter: Payer: Self-pay | Admitting: Internal Medicine

## 2020-02-25 ENCOUNTER — Ambulatory Visit (INDEPENDENT_AMBULATORY_CARE_PROVIDER_SITE_OTHER): Admitting: Internal Medicine

## 2020-02-25 VITALS — BP 109/78 | HR 99

## 2020-02-25 DIAGNOSIS — L270 Generalized skin eruption due to drugs and medicaments taken internally: Secondary | ICD-10-CM

## 2020-02-25 DIAGNOSIS — I509 Heart failure, unspecified: Secondary | ICD-10-CM

## 2020-02-25 DIAGNOSIS — J852 Abscess of lung without pneumonia: Secondary | ICD-10-CM

## 2020-02-25 DIAGNOSIS — Z8679 Personal history of other diseases of the circulatory system: Secondary | ICD-10-CM

## 2020-02-25 NOTE — Progress Notes (Signed)
[facility] for Infectious Disease  Patient Active Problem List   Diagnosis Date Noted  . Nonspecific chest pain   . Septic embolism ([place]) 02/07/2020  . Multifocal pneumonia   . MRSA bacteremia   . Vasculitis ([place])   . Malnutrition of moderate degree 12/25/2019  . Pleural effusion, bilateral   . Chronic hepatitis C without hepatic coma ([place])   . Infective myositis   . Acute septic pulmonary embolism ([place])   . Goals of care, counseling/discussion   . Palliative care encounter   . Endocarditis 12/14/2019  . Acute CHF (congestive heart failure) ([place]) 12/14/2019  . Anemia 12/14/2019  . Agitation 08/19/2019  . Endocarditis of tricuspid valve   . IVDU (intravenous drug user)   . Sepsis ([place]) 12/28/2018  . Alcohol use disorder, severe, dependence ([place]) 09/11/2018  . Substance induced mood disorder ([place]) 04/24/2016  . Polysubstance dependence ([place]) 04/23/2016  . Major depressive disorder, single episode, severe without psychotic features ([place]) 04/23/2016  . Major depressive disorder, single episode, severe without psychosis ([place]) 04/23/2016  . Tobacco use disorder 02/01/2014  . ADHD (attention deficit hyperactivity disorder)       Subjective:    Patient ID: Alex [name], male    DOB: 03-24-1985, 34 y.o.   MRN: [id number]  Chief Complaint  Patient presents with  . New Patient (Initial Visit)    HPI:  Alex [name] is a 34 y.o. male IVDU here for f/u tv IE  Of note, he had tv endocarditis 12/2018 s/p vanc --> oritavancin and transitioned to 6 weeks bactrim   hre represented to [facility] 10/4 for mrsa bacteremia and echo finding tv vegetations He was admitted early 12/2009 with sepsis found to have mrsa ie endocarditis Initially started on vanc Tee with tv vegetations/regurg Complicated by pulm cavitations and psoas and lumbar parasinous myositis cts evaluation not candidate for angiovac  Developed LCV on vanc switched to dapto on 11/01 then linezolid  11/08 (4 weeks iv prior) to finish course  He was discharged home but returned 11/28 with syndrome of right sided overload. tte shows persistent tv vegeations. bcx negative. Rash ?worse. cxr worse cavitationa. By this time he was incarcerated. abx changed to doxy after a dose oritavancin  12/16 id visit He tolerate doxycycline Had another ed visit 12/7 for chest pain but no active issue related to infection No f/c Lower back pain mild stable No diarrhea/n/v, LE weakness No cough Chest pain moderate vague/diffuse; worse with deep breathing     Allergies  Allergen Reactions  . Vancomycin Rash    Patient on vancomycin for 3 weeks, developed rash on his lower extremities. Did not resemble red man syndrome. Required change of abx      Outpatient Medications Prior to Visit  Medication Sig Dispense Refill  . doxycycline (ADOXA) 100 MG tablet Take 1 tablet (100 mg total) by mouth 2 (two) times daily for 84 doses. Begin taking 12/10 84 tablet 0  . oxyCODONE (OXY IR/ROXICODONE) 5 MG immediate release tablet Take 1 tablet (5 mg total) by mouth every 6 (six) hours as needed for severe pain. 14 tablet 0  . traMADol (ULTRAM) 50 MG tablet Take 1 tablet (50 mg total) by mouth every 6 (six) hours as needed for moderate pain. 14 tablet 0  . acetaminophen (TYLENOL) 325 MG tablet Take 2 tablets (650 mg total) by mouth every 4 (four) hours as needed for headache or mild pain. (Patient not taking: Reported on  02/25/2020)    . albuterol (PROVENTIL HFA) 108 (90 Base) MCG/ACT inhaler Inhale 1-2 puffs into the lungs every 4 (four) hours as needed for wheezing or shortness of breath. (Patient not taking: Reported on 02/25/2020) 8 g 2  . furosemide (LASIX) 20 MG tablet Take 1 tablet (20 mg total) by mouth daily. (Patient not taking: Reported on 02/25/2020) 30 tablet 0   No facility-administered medications prior to visit.     Social History   Socioeconomic History  . Marital status: Single    Spouse  name: Not on file  . Number of children: Not on file  . Years of education: Not on file  . Highest education level: Not on file  Occupational History  . Not on file  Tobacco Use  . Smoking status: Current Every Day Smoker    Packs/day: 1.00    Years: 10.00    Pack years: 10.00    Types: Cigarettes  . Smokeless tobacco: Never Used  Vaping Use  . Vaping Use: Never used  Substance and Sexual Activity  . Alcohol use: Yes    Alcohol/week: 12.0 standard drinks    Types: 12 Cans of beer per week    Comment: 12 beers daily  . Drug use: Yes    Frequency: 6.0 times per week    Types: Marijuana, Cocaine, Heroin, Fentanyl, IV    Comment: daily use cocaine (snort)  . Sexual activity: Not Currently  Other Topics Concern  . Not on file  Social History Narrative  . Not on file   Social Determinants of Health   Financial Resource Strain: Not on file  Food Insecurity: Not on file  Transportation Needs: Not on file  Physical Activity: Not on file  Stress: Not on file  Social Connections: Not on file  Intimate Partner Violence: Not on file      Review of Systems   negative 11 point ros unless mentioned above  Objective:    BP 109/78   Pulse 99  Nursing note and vital signs reviewed.  Physical Exam Accompanied by 2 officers Thin, well appearing, no distress Heent: normocephalic; per; conj clear; eomi Neck supple cv rrr soft systolic murmur; no rub Lungs clear abd s/nt Ext no edema msk mild-mod tenderness bilateral and midline lumbar area Skin hyperpigmentation of punctate 2-3 mm bilateral LE; some eschared Neuro cn2-12 intact, strength/reflex intact Psych alert/oriented       Labs: 12/06 cr 0.8; lft 47/70/194/0.5; cbc 9/12/264; inr 1.3  Micro: 12/06 bcx negative 11/28 bcx negative 10/06, 10/10, 10/26 bcx negative 10/04 bcx mrsa  Serology:  Imaging: 12/01 mr lumbar thoracic spine 1. No evidence of discitis-osteomyelitis or epidural abscess. 2. No  significant spinal canal or neural foraminal stenosis at any level. 3. Bilateral pleural effusion. Please refer to CT angiogram of the chest performed on February 07, 2020 for additional chest findings.  11/28 chest cta 1. No evidence for acute pulmonary embolus. 2. Multifocal bilateral peripheral predominant areas of nodularity, scarring, and architectural distortion consistent with sequelae of previous septic emboli. Most of the previously noted nodules demonstrate interval improvement from previous exam. However, there is a new cavitary lung nodule within the posterior right lower lobe measuring 1.9 cm. 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 3. Enlarged right atrium and right ventricle with reflux of contrast material into the IVC and hepatic veins consistent with passive venous congestion due to right heart failure.  11/28 tte 1. Left ventricular ejection fraction, by estimation, is 60 to 65%.  The  left ventricle has normal function. The left ventricle has no regional  wall motion abnormalities. There is mild left ventricular hypertrophy.  Left ventricular diastolic parameters  were normal. There is the interventricular septum is flattened in systole  and diastole, consistent with right ventricular pressure and volume  overload.  2. Coarse trabeculation of the RV apex. Right ventricular systolic  function is mildly reduced. The right ventricular size is severely  enlarged.  3. Right atrial size was severely dilated.  4. The mitral valve is abnormal. Trivial mitral valve regurgitation.  There is mild late systolic prolapse of multiple scallops of the posterior  leaflet of the mitral valve.  5. Mobile vegetations noted on the posterior and anterior TV leaflets  (measures 1.5 x 0.7 cm posterior and up to 2.21 x 0.5 cm on the anterior  leaflet).. The tricuspid valve is abnormal. Tricuspid valve regurgitation  is severe.  6. The aortic valve is  tricuspid. Aortic valve regurgitation is not  visualized.  7. The inferior vena cava is dilated in size with <50% respiratory  variability, suggesting right atrial pressure of 15 mmHg.    10/08 mr lumbar thoracic 1. No evidence for osteomyelitis discitis or septic arthritis within the thoracic or lumbar spine. No epidural abscess or other intraspinal infection. 2. Diffuse edema throughout the lower lumbar posterior paraspinous musculature, with similar changes along the posterior margins of the psoas musculature bilaterally. Findings suggestive of acute myositis, likely infectious in nature. No loculated soft tissue collections or abscess. 3. Large irregular bilateral pleural effusions with associated cystic/cavitary changes within the partially visualized lungs, consistent with known history of septic emboli. 4. Shallow left subarticular to foraminal disc protrusion at L5-S1 with resultant mild left foraminal and left lateral recess stenosis.  Assessment & Plan:   Problem List Items Addressed This Visit   None   Visit Diagnoses    Congestive heart failure, unspecified HF chronicity, unspecified heart failure type ([place])    -  Primary   Relevant Orders   CBC w/Diff   Comp Met (CMET)   C-reactive protein   Abscess of lung without pneumonia, unspecified laterality ([place])       Relevant Orders   CBC w/Diff   Comp Met (CMET)   C-reactive protein   History of acquired endocarditis       Relevant Orders   CBC w/Diff   Comp Met (CMET)   C-reactive protein   Drug rash         34 yo male ivdu admitted 12/2019 [place] for tv endocarditis mrsa complicated by lung abscesses, placed on vanc, but developed drug rash switched to dapto -> linezolid, subsequently represented twice with chest pain/right heart failure then chest pain again, and in the interim was started back on doxycycline for lung abscesses   He is currently in jail as of 02/25/2020  Had back pain during initial  admissions at [facility] and mri with psoas/lumbar paraspinous myositis; no osseous abnormality  He was started on furosemide the 1st representation 02/07/2020 to [facility] [facility] with controll of right heart failure; at that time started on doxycycline plan 6 weeks until mid 03/2020 due to concern new septic pulmonary emboli on chest cta. Blood cultures repeat negative He was in the ed at [facility] 12/07 for the 2nd episode of chest pain; discharged back to jail as no active unaddressed issue found  His crp has overall continued to improve An tte 11/28 showed normal lv function, remaining tv vegeations/thrombuses with  tr Mri early 02/2020 of lumbar thoracic spine no longer mention myositis changes  His chest pain is due to irritation by the lungs cavitation/abscesses. His LE rash from prior vanc had resolved now with residual pigmentation He still have some back pain lumbar area. Already on treatment. If crp up or worsening back pain, can consider reimaging   -supportive care with ibuprofen 600 mg tid prn pain along with tylenol 650 mg po q6hours prn pain -continue doxycycline until 03/22/2020 -cbc, cmp, crp today -f/u 4 weeks.  I am having Alex [name] maintain his albuterol, acetaminophen, doxycycline, furosemide, oxyCODONE, and traMADol.   Follow-up: 4 weeks      Jabier Mutton, [facility] for Infectious [place] -- -- pager   [phone] cell 02/25/2020, 9:19 AM

## 2020-02-25 NOTE — Patient Instructions (Addendum)
Thank you for seeing [place] today  Continue doxycycline until 03/22/2020  Blood tests today   Follow up with [place] in 4 weeks   Take ibuprofen and tylenol as needed for chest pain   You'll need to see the chest/heart surgeon once drug use in remission for 6 months

## 2020-02-26 LAB — CBC WITH DIFFERENTIAL/PLATELET
Absolute Monocytes: 1128 cells/uL — ABNORMAL HIGH (ref 200–950)
Basophils Absolute: 85 cells/uL (ref 0–200)
Basophils Relative: 0.6 %
Eosinophils Absolute: 99 cells/uL (ref 15–500)
Eosinophils Relative: 0.7 %
HCT: 40.3 % (ref 38.5–50.0)
Hemoglobin: 12.9 g/dL — ABNORMAL LOW (ref 13.2–17.1)
Lymphs Abs: 3948 cells/uL — ABNORMAL HIGH (ref 850–3900)
MCH: 26.8 pg — ABNORMAL LOW (ref 27.0–33.0)
MCHC: 32 g/dL (ref 32.0–36.0)
MCV: 83.8 fL (ref 80.0–100.0)
MPV: 11.8 fL (ref 7.5–12.5)
Monocytes Relative: 8 %
Neutro Abs: 8841 cells/uL — ABNORMAL HIGH (ref 1500–7800)
Neutrophils Relative %: 62.7 %
Platelets: 313 10*3/uL (ref 140–400)
RBC: 4.81 10*6/uL (ref 4.20–5.80)
RDW: 17.4 % — ABNORMAL HIGH (ref 11.0–15.0)
Total Lymphocyte: 28 %
WBC: 14.1 10*3/uL — ABNORMAL HIGH (ref 3.8–10.8)

## 2020-02-26 LAB — COMPREHENSIVE METABOLIC PANEL
AG Ratio: 1.2 (calc) (ref 1.0–2.5)
ALT: 63 U/L — ABNORMAL HIGH (ref 9–46)
AST: 36 U/L (ref 10–40)
Albumin: 4.4 g/dL (ref 3.6–5.1)
Alkaline phosphatase (APISO): 230 U/L — ABNORMAL HIGH (ref 36–130)
BUN: 17 mg/dL (ref 7–25)
CO2: 24 mmol/L (ref 20–32)
Calcium: 10 mg/dL (ref 8.6–10.3)
Chloride: 103 mmol/L (ref 98–110)
Creat: 0.76 mg/dL (ref 0.60–1.35)
Globulin: 3.6 g/dL (calc) (ref 1.9–3.7)
Glucose, Bld: 68 mg/dL (ref 65–99)
Potassium: 4.1 mmol/L (ref 3.5–5.3)
Sodium: 139 mmol/L (ref 135–146)
Total Bilirubin: 0.7 mg/dL (ref 0.2–1.2)
Total Protein: 8 g/dL (ref 6.1–8.1)

## 2020-02-26 LAB — C-REACTIVE PROTEIN: CRP: 43.6 mg/L — ABNORMAL HIGH (ref <8.0)

## 2020-04-24 NOTE — Progress Notes (Deleted)
Cardiology Office Note:    Date:  04/24/2020   ID:  Alex [name], DOB 11-02-1985, MRN [id number]  PCP:  Patient, No Pcp Per  Cardiologist:  Thurmon Fair, MD  Electrophysiologist:  None   Referring MD: Glade Lloyd, MD   No chief complaint on file. ***  History of Present Illness:    Alex [name] is a 35 y.o. male with a hx of recurrent tricuspid valve endocarditis, IVDA, depression, tobacco use  He was found in October 2021 to have tricuspid valve endocarditis with mobile vegetations on the tricuspid leaflets with wide open/torrential TR on echo.  Thought to be a poor surgical candidate given ongoing substance abuse.  He was treated with antibiotics.  He was readmitted in November 2021 with acute on chronic right-sided heart failure and septic emboli.  Echocardiogram that admission showed EF 60 to 65%, mild RV systolic dysfunction, severe right atrial enlargement, mobile vegetation on tricuspid valve leaflets with severe TR. Cardiothoracic surgery again recommended nonsurgical management.  He was discharged on antibiotics.  Past Medical History:  Diagnosis Date  . ADHD (attention deficit hyperactivity disorder)   . Allergy   . Asthma   . CHF (congestive heart failure) (HCC)   . Depressed   . 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 ETOH abuse   . History of suicidal tendencies   . Polysubstance abuse [facility])     Past Surgical History:  Procedure Laterality Date  . RADIOLOGY WITH ANESTHESIA N/A 12/18/2019   Procedure: MRI-THORACIC MRI WITH AND WITHOUT CONSTRAST;  Surgeon: Radiologist, Medication, MD;  Location: [facility] OR;  Service: Radiology;  Laterality: N/A;  . RADIOLOGY WITH ANESTHESIA N/A 01/14/2020   Procedure: MRI WITH ANESTHESIA OF THORACIC SPINE AND LUMBAR SPINE;  Surgeon: Radiologist, Medication, MD;  Location: [facility] OR;  Service: Radiology;  Laterality: N/A;  . RADIOLOGY WITH ANESTHESIA N/A 02/10/2020   Procedure: MRI WITH ANESTHESIA OF THORACIC SPINE WITH AND WIHTOUT  AND LUMBAR WITH AND WITHOUT CONTRAST;  Surgeon: Radiologist, Medication, MD;  Location: [facility] OR;  Service: Radiology;  Laterality: N/A;  . TEE WITHOUT CARDIOVERSION N/A 12/31/2018   Procedure: TRANSESOPHAGEAL ECHOCARDIOGRAM (TEE);  Surgeon: Thurmon Fair, MD;  Location: [facility] ENDOSCOPY;  Service: Cardiovascular;  Laterality: N/A;    Current Medications: No outpatient medications have been marked as taking for the 04/25/20 encounter (Appointment) with [name], MD.     Allergies:   Vancomycin   Social History   Socioeconomic History  . Marital status: Single    Spouse name: Not on file  . Number of children: Not on file  . Years of education: Not on file  . Highest education level: Not on file  Occupational History  . Not on file  Tobacco Use  . Smoking status: Current Every Day Smoker    Packs/day: 1.00    Years: 10.00    Pack years: 10.00    Types: Cigarettes  . Smokeless tobacco: Never Used  Vaping Use  . Vaping Use: Never used  Substance and Sexual Activity  . Alcohol use: Yes    Alcohol/week: 12.0 standard drinks    Types: 12 Cans of beer per week    Comment: 12 beers daily  . Drug use: Yes    Frequency: 6.0 times per week    Types: Marijuana, Cocaine, Heroin, Fentanyl, IV    Comment: daily use cocaine (snort)  . Sexual activity: Not Currently  Other Topics Concern  . Not on file  Social History Narrative  . Not on  file   Social Determinants of Health   Financial Resource Strain: Not on file  Food Insecurity: Not on file  Transportation Needs: Not on file  Physical Activity: Not on file  Stress: Not on file  Social Connections: Not on file     Family History: The patient's ***family history includes COPD in an other family member.  ROS:   Please see the history of present illness.    *** All other systems reviewed and are negative.  EKGs/Labs/Other Studies Reviewed:    The following studies were reviewed today: ***  EKG:  EKG is ***  ordered today.  The ekg ordered today demonstrates ***  Recent Labs: 02/09/2020: B Natriuretic Peptide 395.2 02/12/2020: Magnesium 2.1 02/25/2020: ALT 63; BUN 17; Creat 0.76; Hemoglobin 12.9; Platelets 313; Potassium 4.1; Sodium 139  Recent Lipid Panel No results found for: CHOL, TRIG, HDL, CHOLHDL, VLDL, LDLCALC, LDLDIRECT  Physical Exam:    VS:  There were no vitals taken for this visit.    Wt Readings from Last 3 Encounters:  02/16/20 152 lb (68.9 kg)  01/19/20 136 lb 14.5 oz (62.1 kg)  01/06/19 119 lb 9.6 oz (54.3 kg)     GEN: *** Well nourished, well developed in no acute distress HEENT: Normal NECK: No JVD; No carotid bruits LYMPHATICS: No lymphadenopathy CARDIAC: ***RRR, no murmurs, rubs, gallops RESPIRATORY:  Clear to auscultation without rales, wheezing or rhonchi  ABDOMEN: Soft, non-tender, non-distended MUSCULOSKELETAL:  No edema; No deformity  SKIN: Warm and dry NEUROLOGIC:  Alert and oriented x 3 PSYCHIATRIC:  Normal affect   ASSESSMENT:    No diagnosis found. PLAN:    Tricuspid valve endocarditis: Due to IVDA.  Causing severe TR.  Has been evaluated by CT surgery, not felt to be surgical candidate given ongoing substance abuse -Medical management of RV failure.  Continue Lasix  RTC in***   Medication Adjustments/Labs and Tests Ordered: Current medicines are reviewed at length with the patient today.  Concerns regarding medicines are outlined above.  No orders of the defined types were placed in this encounter.  No orders of the defined types were placed in this encounter.   There are no Patient Instructions on file for this visit.   Signed, [name], MD  04/24/2020 4:02 PM    [facility]

## 2020-04-25 ENCOUNTER — Ambulatory Visit: Admitting: Cardiology

## 2020-04-28 ENCOUNTER — Other Ambulatory Visit: Payer: Self-pay

## 2020-04-28 ENCOUNTER — Inpatient Hospital Stay (HOSPITAL_COMMUNITY)
Admission: EM | Admit: 2020-04-28 | Discharge: 2020-05-04 | DRG: 546 | Disposition: A | Discharge status: Home / Self Care | Payer: Self-pay | Attending: Internal Medicine | Admitting: Internal Medicine

## 2020-04-28 ENCOUNTER — Encounter (HOSPITAL_COMMUNITY): Payer: Self-pay | Admitting: Emergency Medicine

## 2020-04-28 ENCOUNTER — Emergency Department (HOSPITAL_COMMUNITY): Payer: Self-pay

## 2020-04-28 DIAGNOSIS — M310 Hypersensitivity angiitis: Principal | ICD-10-CM | POA: Diagnosis present

## 2020-04-28 DIAGNOSIS — F17210 Nicotine dependence, cigarettes, uncomplicated: Secondary | ICD-10-CM | POA: Diagnosis present

## 2020-04-28 DIAGNOSIS — Z86711 Personal history of pulmonary embolism: Secondary | ICD-10-CM

## 2020-04-28 DIAGNOSIS — I071 Rheumatic tricuspid insufficiency: Secondary | ICD-10-CM | POA: Diagnosis present

## 2020-04-28 DIAGNOSIS — F1410 Cocaine abuse, uncomplicated: Secondary | ICD-10-CM | POA: Diagnosis present

## 2020-04-28 DIAGNOSIS — R21 Rash and other nonspecific skin eruption: Secondary | ICD-10-CM

## 2020-04-28 DIAGNOSIS — I776 Arteritis, unspecified: Secondary | ICD-10-CM

## 2020-04-28 DIAGNOSIS — Z881 Allergy status to other antibiotic agents status: Secondary | ICD-10-CM

## 2020-04-28 DIAGNOSIS — F909 Attention-deficit hyperactivity disorder, unspecified type: Secondary | ICD-10-CM | POA: Diagnosis present

## 2020-04-28 DIAGNOSIS — F1990 Other psychoactive substance use, unspecified, uncomplicated: Secondary | ICD-10-CM | POA: Diagnosis present

## 2020-04-28 DIAGNOSIS — F1910 Other psychoactive substance abuse, uncomplicated: Secondary | ICD-10-CM | POA: Diagnosis present

## 2020-04-28 DIAGNOSIS — L03115 Cellulitis of right lower limb: Secondary | ICD-10-CM | POA: Diagnosis present

## 2020-04-28 DIAGNOSIS — Z8614 Personal history of Methicillin resistant Staphylococcus aureus infection: Secondary | ICD-10-CM

## 2020-04-28 DIAGNOSIS — Z79899 Other long term (current) drug therapy: Secondary | ICD-10-CM

## 2020-04-28 DIAGNOSIS — D692 Other nonthrombocytopenic purpura: Secondary | ICD-10-CM

## 2020-04-28 DIAGNOSIS — Z20822 Contact with and (suspected) exposure to covid-19: Secondary | ICD-10-CM | POA: Diagnosis present

## 2020-04-28 DIAGNOSIS — I079 Rheumatic tricuspid valve disease, unspecified: Secondary | ICD-10-CM | POA: Diagnosis present

## 2020-04-28 DIAGNOSIS — B182 Chronic viral hepatitis C: Secondary | ICD-10-CM | POA: Diagnosis present

## 2020-04-28 DIAGNOSIS — Z825 Family history of asthma and other chronic lower respiratory diseases: Secondary | ICD-10-CM

## 2020-04-28 DIAGNOSIS — I50813 Acute on chronic right heart failure: Secondary | ICD-10-CM | POA: Diagnosis present

## 2020-04-28 DIAGNOSIS — L03116 Cellulitis of left lower limb: Secondary | ICD-10-CM | POA: Diagnosis present

## 2020-04-28 LAB — CBC WITH DIFFERENTIAL/PLATELET
Abs Immature Granulocytes: 0.02 10*3/uL (ref 0.00–0.07)
Basophils Absolute: 0 10*3/uL (ref 0.0–0.1)
Basophils Relative: 1 %
Eosinophils Absolute: 0.2 10*3/uL (ref 0.0–0.5)
Eosinophils Relative: 2 %
HCT: 36.3 % — ABNORMAL LOW (ref 39.0–52.0)
Hemoglobin: 11.1 g/dL — ABNORMAL LOW (ref 13.0–17.0)
Immature Granulocytes: 0 %
Lymphocytes Relative: 18 %
Lymphs Abs: 1.2 10*3/uL (ref 0.7–4.0)
MCH: 27.6 pg (ref 26.0–34.0)
MCHC: 30.6 g/dL (ref 30.0–36.0)
MCV: 90.3 fL (ref 80.0–100.0)
Monocytes Absolute: 0.6 10*3/uL (ref 0.1–1.0)
Monocytes Relative: 9 %
Neutro Abs: 4.7 10*3/uL (ref 1.7–7.7)
Neutrophils Relative %: 70 %
Platelets: 224 10*3/uL (ref 150–400)
RBC: 4.02 MIL/uL — ABNORMAL LOW (ref 4.22–5.81)
RDW: 16.6 % — ABNORMAL HIGH (ref 11.5–15.5)
WBC: 6.6 10*3/uL (ref 4.0–10.5)
nRBC: 0 % (ref 0.0–0.2)

## 2020-04-28 LAB — C-REACTIVE PROTEIN: CRP: 6.6 mg/dL — ABNORMAL HIGH (ref <1.0)

## 2020-04-28 LAB — COMPREHENSIVE METABOLIC PANEL
ALT: 37 U/L (ref 0–44)
AST: 52 U/L — ABNORMAL HIGH (ref 15–41)
Albumin: 3.2 g/dL — ABNORMAL LOW (ref 3.5–5.0)
Alkaline Phosphatase: 158 U/L — ABNORMAL HIGH (ref 38–126)
Anion gap: 11 (ref 5–15)
BUN: 24 mg/dL — ABNORMAL HIGH (ref 6–20)
CO2: 26 mmol/L (ref 22–32)
Calcium: 9.1 mg/dL (ref 8.9–10.3)
Chloride: 101 mmol/L (ref 98–111)
Creatinine, Ser: 0.94 mg/dL (ref 0.61–1.24)
GFR, Estimated: >60 mL/min (ref >60)
Glucose, Bld: 102 mg/dL — ABNORMAL HIGH (ref 70–99)
Potassium: 4.3 mmol/L (ref 3.5–5.1)
Sodium: 138 mmol/L (ref 135–145)
Total Bilirubin: 0.9 mg/dL (ref 0.3–1.2)
Total Protein: 7.4 g/dL (ref 6.5–8.1)

## 2020-04-28 LAB — URINALYSIS, ROUTINE W REFLEX MICROSCOPIC
Bacteria, UA: NONE SEEN
Bilirubin Urine: NEGATIVE
Glucose, UA: NEGATIVE mg/dL
Ketones, ur: NEGATIVE mg/dL
Leukocytes,Ua: NEGATIVE
Nitrite: NEGATIVE
Protein, ur: NEGATIVE mg/dL
Specific Gravity, Urine: 1.018 (ref 1.005–1.030)
pH: 6 (ref 5.0–8.0)

## 2020-04-28 LAB — RESP PANEL BY RT-PCR (FLU A&B, COVID) ARPGX2
Influenza A by PCR: NEGATIVE
Influenza B by PCR: NEGATIVE
SARS Coronavirus 2 by RT PCR: NEGATIVE

## 2020-04-28 LAB — LACTIC ACID, PLASMA: Lactic Acid, Venous: 2.3 mmol/L (ref 0.5–1.9)

## 2020-04-28 LAB — RAPID URINE DRUG SCREEN, HOSP PERFORMED
Amphetamines: POSITIVE — AB
Barbiturates: NOT DETECTED
Benzodiazepines: NOT DETECTED
Cocaine: NOT DETECTED
Opiates: NOT DETECTED
Tetrahydrocannabinol: POSITIVE — AB

## 2020-04-28 LAB — BRAIN NATRIURETIC PEPTIDE: B Natriuretic Peptide: 182.3 pg/mL — ABNORMAL HIGH (ref 0.0–100.0)

## 2020-04-28 LAB — PROTIME-INR
INR: 1.2 (ref 0.8–1.2)
Prothrombin Time: 14.6 seconds (ref 11.4–15.2)

## 2020-04-28 LAB — TROPONIN I (HIGH SENSITIVITY): Troponin I (High Sensitivity): 4 ng/L (ref <18)

## 2020-04-28 MED ORDER — FUROSEMIDE 10 MG/ML IJ SOLN
20.0000 mg | Freq: Once | INTRAMUSCULAR | Status: AC
  Administered 2020-04-28: 20 mg via INTRAVENOUS
  Filled 2020-04-28: qty 4

## 2020-04-28 MED ORDER — ONDANSETRON HCL 4 MG/2ML IJ SOLN: 4.0000 mg | Freq: Four times a day (QID) | INTRAMUSCULAR | Status: DC | PRN

## 2020-04-28 MED ORDER — SODIUM CHLORIDE 0.9% FLUSH
3.0000 mL | Freq: Two times a day (BID) | INTRAVENOUS | Status: DC
  Administered 2020-04-28 – 2020-05-04 (×12): 3 mL via INTRAVENOUS

## 2020-04-28 MED ORDER — ACETAMINOPHEN 500 MG PO TABS
1000.0000 mg | ORAL_TABLET | Freq: Three times a day (TID) | ORAL | Status: DC | PRN
  Administered 2020-04-28 – 2020-05-02 (×3): 1000 mg via ORAL
  Filled 2020-04-28 (×3): qty 2

## 2020-04-28 MED ORDER — ENOXAPARIN SODIUM 40 MG/0.4ML ~~LOC~~ SOLN
40.0000 mg | SUBCUTANEOUS | Status: DC
  Administered 2020-04-28 – 2020-05-03 (×6): 40 mg via SUBCUTANEOUS
  Filled 2020-04-28 (×6): qty 0.4

## 2020-04-28 MED ORDER — ACETAMINOPHEN 650 MG RE SUPP: 650.0000 mg | Freq: Four times a day (QID) | RECTAL | Status: DC | PRN

## 2020-04-28 MED ORDER — SENNOSIDES-DOCUSATE SODIUM 8.6-50 MG PO TABS: 1.0000 | ORAL_TABLET | Freq: Every evening | ORAL | Status: DC | PRN

## 2020-04-28 MED ORDER — OXYCODONE HCL 5 MG PO TABS
10.0000 mg | ORAL_TABLET | ORAL | Status: AC | PRN
  Administered 2020-04-28 – 2020-04-29 (×4): 10 mg via ORAL
  Filled 2020-04-28 (×5): qty 2

## 2020-04-28 MED ORDER — OXYCODONE HCL 5 MG PO TABS
5.0000 mg | ORAL_TABLET | Freq: Once | ORAL | Status: AC
  Administered 2020-04-28: 5 mg via ORAL
  Filled 2020-04-28: qty 1

## 2020-04-28 MED ORDER — NICOTINE 21 MG/24HR TD PT24
21.0000 mg | MEDICATED_PATCH | Freq: Every day | TRANSDERMAL | Status: DC
  Administered 2020-05-01 – 2020-05-04 (×4): 21 mg via TRANSDERMAL
  Filled 2020-04-28 (×7): qty 1

## 2020-04-28 MED ORDER — ONDANSETRON HCL 4 MG PO TABS: 4.0000 mg | ORAL_TABLET | Freq: Four times a day (QID) | ORAL | Status: DC | PRN

## 2020-04-28 NOTE — Progress Notes (Signed)
When doing nursing admission history pt states he has 2 bottles of medications with him. Instructed him not to take them and told him his nurse will be giving him the medications his doctor orders.Patient verbalizes understanding.  Sticky note left on chart to staff nurse on unit that pt will be going to so the nurse can follow up and document on nursing admission history. Briscoe Burns BSN, RN-BC Admissions RN 04/28/2020 6:59 PM

## 2020-04-28 NOTE — H&P (Addendum)
History and Physical    Alex [name] TIR:[id number] DOB: July 02, 1985 DOA: 04/28/2020  PCP: Patient, No Pcp Per  Patient coming from: Home  I have personally briefly reviewed patient's old medical records in [facility]  Chief Complaint: Lower extremity swelling, worsening rash  HPI: Alex [name] is a 35 y.o. 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  Patient with known tricuspid valve MRSA endocarditis with multiple recent admissions.  Most recently admitted 02/07/2020-02/12/2020 at which time he was found to have pulmonary septic emboli.  He was initially treated with IV vancomycin however developed a suspected drug rash and antibiotic with switched to daptomycin.  He received a dose of IV oritavancin on 02/11/2020 and was discharged on a 6-week course of oral doxycycline (patient did not fill until 04/16/2020).  During that hospitalization blood cultures remained negative. TTE 02/07/2020 again showed tricuspid valve endocarditis with severe TR. CT surgery recommended conservative nonsurgical management.    Patient states that over the last 2 weeks he has had progressive swelling in both of his lower extremities.  His legs have been feeling tight and he noticed worsening red rash.  Initially the areas appeared as blisters.  Area became pruritic and he says he was scratching his lower legs frequently.  He says this caused some open sores which are now scabbing over.  He is not sure if he seen any drainage from the area.  He filled his previous doxycycline prescription on 04/16/2020 which he started taking without improvement.  He says he walks a lot and has not had any  shortness of breath except for when walking up a big hill.  He denies any other subjective fevers, chills, diaphoresis, chest pain, palpitations, nausea, vomiting, abdominal pain, dysuria, or diarrhea.  He reports snorting opiates several days ago.  He denies any injection drug use over the last couple months.  He denies any recent cocaine or alcohol use.  He smokes half pack of cigarettes per day.  He denies using any recreational benzodiazepines.  ED Course:  Initial vitals showed BP 122/94, pulse 106, RR 18, temp 98.1 F, SPO2 100% on room air.  Labs show WBC 6.6, hemoglobin 11.1, platelets 224,000, sodium 138, potassium 4.3, bicarb 26, BUN 24, creatinine 0.94, serum glucose 102, AST 52, ALT 37, alkaline phosphatase 158, total bilirubin 0.9, lactic acid 2.3, BNP 182.3, high-sensitivity troponin I 4.  Blood cultures obtained and pending.  Urinalysis negative for UTI.  UDS obtained and pending.  SARS-CoV-2 PCR panel obtained and pending.  Portable chest x-ray personally reviewed and shows significant improvement of previous bilateral pulmonary infiltrates.  Patient was given IV Lasix 20 mg once and OxyIR 5 mg.  EDP discussed with on-call infectious disease who recommended medical admission and will plan to consult tomorrow.  Okay to hold antibiotics as patient not septic appearing.  EDP also discussed with on-call CT surgery who recommended repeat echocardiogram and vascular surgery who felt there was no vascular surgery needed at this time.  The hospitalist service was consulted to admit for further evaluation and management.  Review of Systems: All systems reviewed and are negative except as documented in history of present illness above.   Past Medical History:  Diagnosis Date  . ADHD (attention deficit hyperactivity disorder)   .  Allergy   . Asthma   . CHF (congestive heart failure) (HCC)   . Depressed   . 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 ETOH abuse   . History of suicidal  tendencies   . Polysubstance abuse [facility])     Past Surgical History:  Procedure Laterality Date  . RADIOLOGY WITH ANESTHESIA N/A 12/18/2019   Procedure: MRI-THORACIC MRI WITH AND WITHOUT CONSTRAST;  Surgeon: Radiologist, Medication, MD;  Location: [facility] OR;  Service: Radiology;  Laterality: N/A;  . RADIOLOGY WITH ANESTHESIA N/A 01/14/2020   Procedure: MRI WITH ANESTHESIA OF THORACIC SPINE AND LUMBAR SPINE;  Surgeon: Radiologist, Medication, MD;  Location: [facility] OR;  Service: Radiology;  Laterality: N/A;  . RADIOLOGY WITH ANESTHESIA N/A 02/10/2020   Procedure: MRI WITH ANESTHESIA OF THORACIC SPINE WITH AND WIHTOUT AND LUMBAR WITH AND WITHOUT CONTRAST;  Surgeon: Radiologist, Medication, MD;  Location: [facility] OR;  Service: Radiology;  Laterality: N/A;  . TEE WITHOUT CARDIOVERSION N/A 12/31/2018   Procedure: TRANSESOPHAGEAL ECHOCARDIOGRAM (TEE);  Surgeon: Thurmon Fair, MD;  Location: [facility] ENDOSCOPY;  Service: Cardiovascular;  Laterality: N/A;    Social History:  reports that he has been smoking cigarettes. He has a 10.00 pack-year smoking history. He has never used smokeless tobacco. He reports current alcohol use of about 12.0 standard drinks of alcohol per week. He reports current drug use. Frequency: 6.00 times per week. Drugs: Marijuana, Cocaine, Heroin, Fentanyl, and IV.  Allergies  Allergen Reactions  . Vancomycin Rash    Patient on vancomycin for 3 weeks, developed rash on his lower extremities. Did not resemble red man syndrome. Required change of abx    Family History  Problem Relation Age of Onset  . COPD Other      Prior to Admission medications   Medication Sig Start Date End Date Taking? Authorizing Provider  furosemide (LASIX) 20 MG tablet Take 1 tablet (20 mg total) by mouth daily. Patient not taking: Reported on 02/25/2020 02/12/20   Glade Lloyd, MD  oxyCODONE (OXY IR/ROXICODONE) 5 MG immediate release tablet Take 1 tablet (5 mg total) by mouth every 6 (six) hours as needed for  severe pain. 02/12/20   Glade Lloyd, MD  traMADol (ULTRAM) 50 MG tablet Take 1 tablet (50 mg total) by mouth every 6 (six) hours as needed for moderate pain. 02/12/20   Glade Lloyd, MD  albuterol (PROVENTIL HFA) 108 (90 Base) MCG/ACT inhaler Inhale 1-2 puffs into the lungs every 4 (four) hours as needed for wheezing or shortness of breath. Patient not taking: Reported on 02/25/2020 09/08/18 04/28/20  Wieters, Hallie C, PA-C  ipratropium-albuterol (DUONEB) 0.5-2.5 (3) MG/3ML SOLN Take 3 mLs by nebulization every 6 (six) hours as needed (wheezing). Patient not taking: Reported on 12/28/2018 04/14/18 12/28/18  Elpidio Anis, PA-C  montelukast (SINGULAIR) 10 MG tablet Take 1 tablet (10 mg total) by mouth at bedtime. Patient not taking: Reported on 12/28/2018 07/25/17 12/28/18  Georgetta Haber, NP  traZODone (DESYREL) 50 MG tablet Take 1 tablet (50 mg total) by mouth at bedtime and may repeat dose one time if needed. Patient not taking: Reported on 07/05/2018 04/29/16 12/28/18  Oneta Rack, NP    Physical Exam: Vitals:   04/28/20 1700 04/28/20 1730 04/28/20 1800 04/28/20 1830  BP: 137/90 (!) 135/105 (!) 130/98 (!) 126/92  Pulse: 82 (!) 103 79 83  Resp: 11 15 11 13   Temp:      TempSrc:      SpO2: 100% 94% 100% 99%   Constitutional: Sitting up  in bed, NAD, calm, comfortable Eyes: PERRL, lids and conjunctivae normal ENMT: Mucous membranes are moist. Posterior pharynx clear of any exudate or lesions.Normal dentition.  Neck: normal, supple, no masses. Respiratory: clear to auscultation bilaterally, no wheezing, no crackles. Normal respiratory effort. No accessory muscle use.  Cardiovascular: Regular rate and rhythm, soft early systolic murmur present.  +1 bilateral lower extremity edema. 2+ pedal pulses. Abdomen: no tenderness, no masses palpated.  Bowel sounds positive.  Musculoskeletal: no clubbing / cyanosis. No joint deformity upper and lower extremities. Good ROM, no contractures. Normal  muscle tone.  Skin: Numerous petechial lesions on all extremities and several on the face.  Significant maculopapular rash on the lower extremities with large scabbed over areas on the feet and above both ankles as pictured below, question of some early ulceration.  Areas are tender to light palpation. Neurologic: CN 2-12 grossly intact. Sensation intact. Strength 5/5 in all 4.  Psychiatric: Normal judgment and insight. Alert and oriented x 3. Normal mood.             Labs on Admission: I have personally reviewed following labs and imaging studies  CBC: Recent Labs  Lab 04/28/20 1600  WBC 6.6  NEUTROABS 4.7  HGB 11.1*  HCT 36.3*  MCV 90.3  PLT 224   Basic Metabolic Panel: Recent Labs  Lab 04/28/20 1600  NA 138  K 4.3  CL 101  CO2 26  GLUCOSE 102*  BUN 24*  CREATININE 0.94  CALCIUM 9.1   GFR: CrCl cannot be calculated (Unknown ideal weight.). Liver Function Tests: Recent Labs  Lab 04/28/20 1600  AST 52*  ALT 37  ALKPHOS 158*  BILITOT 0.9  PROT 7.4  ALBUMIN 3.2*   No results for input(s): LIPASE, AMYLASE in the last 168 hours. No results for input(s): AMMONIA in the last 168 hours. Coagulation Profile: Recent Labs  Lab 04/28/20 1600  INR 1.2   Cardiac Enzymes: No results for input(s): CKTOTAL, CKMB, CKMBINDEX, TROPONINI in the last 168 hours. BNP (last 3 results) No results for input(s): PROBNP in the last 8760 hours. HbA1C: No results for input(s): HGBA1C in the last 72 hours. CBG: No results for input(s): GLUCAP in the last 168 hours. Lipid Profile: No results for input(s): CHOL, HDL, LDLCALC, TRIG, CHOLHDL, LDLDIRECT in the last 72 hours. Thyroid Function Tests: No results for input(s): TSH, T4TOTAL, FREET4, T3FREE, THYROIDAB in the last 72 hours. Anemia Panel: No results for input(s): VITAMINB12, FOLATE, FERRITIN, TIBC, IRON, RETICCTPCT in the last 72 hours. Urine analysis:    Component Value Date/Time   COLORURINE YELLOW 04/28/2020  1900   APPEARANCEUR CLEAR 04/28/2020 1900   LABSPEC 1.018 04/28/2020 1900   PHURINE 6.0 04/28/2020 1900   GLUCOSEU NEGATIVE 04/28/2020 1900   HGBUR SMALL (A) 04/28/2020 1900   BILIRUBINUR NEGATIVE 04/28/2020 1900   KETONESUR NEGATIVE 04/28/2020 1900   PROTEINUR NEGATIVE 04/28/2020 1900   NITRITE NEGATIVE 04/28/2020 1900   LEUKOCYTESUR NEGATIVE 04/28/2020 1900    Radiological Exams on Admission: DG Chest Port 1 View  Result Date: 04/28/2020 CLINICAL DATA:  Worsening heart failure. EXAM: PORTABLE CHEST 1 VIEW COMPARISON:  Chest x-ray 02/15/2020. FINDINGS: Mediastinum and hilar structures normal. Cardiomegaly. No pulmonary venous congestion. Interim near complete clearing of bilateral pulmonary infiltrates. No pleural effusion or pneumothorax. Mild scoliosis thoracic spine. IMPRESSION: 1. Interim near complete clearing of bilateral pulmonary infiltrates. 2.  Cardiomegaly.  No pulmonary venous congestion. Electronically Signed   By: Maisie Fus  Register   On: 04/28/2020 16:31  EKG: Ordered and pending.  Assessment/Plan Active Problems:   Endocarditis of tricuspid valve   Vasculitis (HCC)   Substance use disorder  Alex [name] is a 35 y.o. 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  Blood cultures were negative at that time.  CTA showed pulmonary septic emboli.  Was initially on IV vancomycin which was discontinued and switched to daptomycin due to questionable drug rash.  Received 1 dose IV oritavancin  and was discharged with planned 6-week course of oral doxycycline which he did not fill until 04/16/2020.  Has progressive lower extremity edema without dyspnea or evidence of sepsis physiology on admission. -Follow blood cultures -Holding antibiotics at this time -ID to see in a.m. 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  Could also be related to hepatitis C or drug associated such as levamisole vasculitis or simply worsened related to aggressive scratching. EDP discussed with ID who recommended holding off on antibiotics at this time and will see in a.m. Will obtain lower extremity Dopplers.  Polysubstance use disorder: Reports snorting opiates but no recent injection drug use.  UDS is positive for THC and amphetamines.  Denies recent alcohol or use of recreational benzodiazepines.  Smoking half pack cigarettes per day, nicotine patch provided.  Chronic hepatitis C: Untreated.  Follow-up with infectious disease.  DVT prophylaxis: Lovenox Code Status: Full code, confirmed with patient Family Communication: Discussed with patient, he has discussed with family Disposition Plan: From home and likely discharge to home Consults called: Infectious disease to see in a.m. Level of care: Telemetry Admission status:  Status is: Observation  The patient remains OBS appropriate and will d/c before 2 midnights.  Dispo: The patient is from: Home              Anticipated d/c is to: Home              Anticipated d/c date is: 1 day              Patient currently is not medically stable to d/c.   Difficult to place patient No   Darreld McleanVishal Juanita Devincent MD [facility]  If 7PM-7AM, please contact night-coverage [email or web address]  04/28/2020, 8:04 PM

## 2020-04-28 NOTE — ED Triage Notes (Signed)
Pt presents with c/o leg swelling. Pt has a hx of CHF, reports that he walks a significant amount. Pt reports that his legs have been weeping with significant swelling and purple discoloration.

## 2020-04-28 NOTE — ED Provider Notes (Signed)
[facility]-EMERGENCY DEPT Provider Note   CSN: [id number] Arrival date & time: 04/28/20  1333     History Chief Complaint  Patient presents with  . Leg Swelling    Alex [name] is a 35 y.o. male.  Patient with history of IV drug abuse, history of tricuspid valve endocarditis and MRSA, history of right sided heart failure --presents to the emergency department for evaluation of lower extremity swelling, weeping, scabbing.  Symptoms have been ongoing for a couple of weeks.  It began as some scabbing and blistering with then worsened.  He went and filled a prescription for doxycycline as well as furosemide and started taking this on 04/17/2020.  Symptoms progressed and he developed a rash more proximally on the legs and of the arms.  He states that while in the hospital (10-01/2021) he developed a similar rash (leukoclastic cellulitis?) that was treated with prednisone taper.  He denies current fevers or chills.  No nausea vomiting or diarrhea.  He states that family members saw his legs couple of days ago and encouraged him to come to the emergency department.  The onset of this condition was acute. The course is worsening. Aggravating factors: none. Alleviating factors: none.          Past Medical History:  Diagnosis Date  . ADHD (attention deficit hyperactivity disorder)   . Allergy   . Asthma   . CHF (congestive heart failure) (HCC)   . Depressed   . 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 ETOH abuse   . History of suicidal tendencies   . Polysubstance abuse [facility])     Patient Active Problem List   Diagnosis Date Noted  . Nonspecific chest pain   . Septic embolism (HCC) 02/07/2020  . Multifocal pneumonia   . MRSA bacteremia   . Vasculitis (HCC)   . Malnutrition of moderate degree 12/25/2019  . Pleural effusion, bilateral   . Chronic hepatitis C without hepatic coma (HCC)   . Infective myositis   . Acute septic pulmonary embolism (HCC)   . Goals  of care, counseling/discussion   . Palliative care encounter   . Endocarditis 12/14/2019  . Acute CHF (congestive heart failure) (HCC) 12/14/2019  . Anemia 12/14/2019  . Agitation 08/19/2019  . Endocarditis of tricuspid valve   . IVDU (intravenous drug user)   . Sepsis (HCC) 12/28/2018  . Alcohol use disorder, severe, dependence (HCC) 09/11/2018  . Substance induced mood disorder (HCC) 04/24/2016  . Polysubstance dependence (HCC) 04/23/2016  . Major depressive disorder, single episode, severe without psychotic features (HCC) 04/23/2016  . 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 Tobacco use disorder 02/01/2014  . ADHD (attention deficit hyperactivity disorder)     Past Surgical History:  Procedure Laterality Date  . RADIOLOGY WITH ANESTHESIA N/A 12/18/2019   Procedure: MRI-THORACIC MRI WITH AND WITHOUT CONSTRAST;  Surgeon: Radiologist, Medication, MD;  Location: [facility] OR;  Service: Radiology;  Laterality: N/A;  . RADIOLOGY WITH ANESTHESIA N/A 01/14/2020   Procedure: MRI WITH ANESTHESIA OF THORACIC SPINE AND LUMBAR SPINE;  Surgeon: Radiologist, Medication, MD;  Location: [facility] OR;  Service: Radiology;  Laterality: N/A;  . RADIOLOGY WITH ANESTHESIA N/A 02/10/2020   Procedure: MRI WITH ANESTHESIA OF THORACIC SPINE WITH AND WIHTOUT AND LUMBAR WITH AND WITHOUT CONTRAST;  Surgeon: Radiologist, Medication, MD;  Location: [facility] OR;  Service: Radiology;  Laterality: N/A;  . TEE WITHOUT CARDIOVERSION N/A 12/31/2018   Procedure: TRANSESOPHAGEAL ECHOCARDIOGRAM (TEE);  Surgeon: Croitoru,  Rachelle Hora, MD;  Location: [facility] ENDOSCOPY;  Service: Cardiovascular;  Laterality: N/A;       Family History  Problem Relation Age of Onset  . COPD Other     Social History   Tobacco Use  . Smoking status: Current Every Day Smoker    Packs/day: 1.00    Years: 10.00    Pack years: 10.00    Types: Cigarettes  . Smokeless tobacco: Never Used  Vaping Use  . Vaping Use: Never  used  Substance Use Topics  . Alcohol use: Yes    Alcohol/week: 12.0 standard drinks    Types: 12 Cans of beer per week    Comment: 12 beers daily  . Drug use: Yes    Frequency: 6.0 times per week    Types: Marijuana, Cocaine, Heroin, Fentanyl, IV    Comment: daily use cocaine (snort)    Home Medications Prior to Admission medications   Medication Sig Start Date End Date Taking? Authorizing Provider  acetaminophen (TYLENOL) 325 MG tablet Take 2 tablets (650 mg total) by mouth every 4 (four) hours as needed for headache or mild pain. Patient not taking: Reported on 02/25/2020 01/19/20   Arrien, York Ram, MD  albuterol (PROVENTIL HFA) 108 (90 Base) MCG/ACT inhaler Inhale 1-2 puffs into the lungs every 4 (four) hours as needed for wheezing or shortness of breath. Patient not taking: Reported on 02/25/2020 09/08/18   Wieters, Hallie C, PA-C  furosemide (LASIX) 20 MG tablet Take 1 tablet (20 mg total) by mouth daily. Patient not taking: Reported on 02/25/2020 02/12/20   Glade Lloyd, MD  oxyCODONE (OXY IR/ROXICODONE) 5 MG immediate release tablet Take 1 tablet (5 mg total) by mouth every 6 (six) hours as needed for severe pain. 02/12/20   Glade Lloyd, MD  traMADol (ULTRAM) 50 MG tablet Take 1 tablet (50 mg total) by mouth every 6 (six) hours as needed for moderate pain. 02/12/20   Glade Lloyd, MD  ipratropium-albuterol (DUONEB) 0.5-2.5 (3) MG/3ML SOLN Take 3 mLs by nebulization every 6 (six) hours as needed (wheezing). Patient not taking: Reported on 12/28/2018 04/14/18 12/28/18  Elpidio Anis, PA-C  montelukast (SINGULAIR) 10 MG tablet Take 1 tablet (10 mg total) by mouth at bedtime. Patient not taking: Reported on 12/28/2018 07/25/17 12/28/18  Georgetta Haber, NP  traZODone (DESYREL) 50 MG tablet Take 1 tablet (50 mg total) by mouth at bedtime and may repeat dose one time if needed. Patient not taking: Reported on 07/05/2018 04/29/16 12/28/18  Oneta Rack, NP    Allergies     Vancomycin  Review of Systems   Review of Systems  Constitutional: Negative for chills and fever.  HENT: Negative for rhinorrhea and sore throat.   Eyes: Negative for redness.  Respiratory: Negative for cough.   Cardiovascular: Positive for leg swelling. Negative for chest pain.  Gastrointestinal: Negative for abdominal pain, diarrhea, nausea and vomiting.  Genitourinary: Negative for dysuria and hematuria.  Musculoskeletal: Positive for myalgias.  Skin: Positive for color change, rash and wound.  Neurological: Negative for headaches.    Physical Exam Updated Vital Signs BP (!) 122/94 (BP Location: Left Arm)   Pulse (!) 106   Temp 98.1 F (36.7 C) (Oral)   Resp 18   SpO2 100%   Physical Exam Vitals and nursing note reviewed.  Constitutional:      Appearance: He is well-developed and well-nourished.  HENT:     Head: Normocephalic and atraumatic.  Eyes:     General:  Right eye: No discharge.        Left eye: No discharge.     Conjunctiva/sclera: Conjunctivae normal.  Cardiovascular:     Rate and Rhythm: Regular rhythm. Tachycardia present.     Pulses: Normal pulses.  Pulmonary:     Effort: Pulmonary effort is normal.     Breath sounds: Normal breath sounds.  Abdominal:     Palpations: Abdomen is soft.     Tenderness: There is no abdominal tenderness.  Musculoskeletal:     Cervical back: Normal range of motion and neck supple.  Skin:    General: Skin is warm and dry.     Comments: Patient with extensive skin rash of the upper and lower extremities (see pictures).  There is nonblanching petechial type rash of both the bilateral upper and lower extremities.  Associated scab-like plaques of the bilateral ankles involving the foot and knee areas.  Slight amount of purulent discharge associated with some of these areas, especially on the right ankle.  Neurological:     Mental Status: He is alert.  Psychiatric:        Mood and Affect: Mood and affect normal.                      ED Results / Procedures / Treatments   Labs (all labs ordered are listed, but only abnormal results are displayed) Labs Reviewed  CBC WITH DIFFERENTIAL/PLATELET - Abnormal; Notable for the following components:      Result Value   RBC 4.02 (*)    Hemoglobin 11.1 (*)    HCT 36.3 (*)    RDW 16.6 (*)    All other components within normal limits  COMPREHENSIVE METABOLIC PANEL - Abnormal; Notable for the following components:   Glucose, Bld 102 (*)    BUN 24 (*)    Albumin 3.2 (*)    AST 52 (*)    Alkaline Phosphatase 158 (*)    All other components within normal limits  LACTIC ACID, PLASMA - Abnormal; Notable for the following components:   Lactic Acid, Venous 2.3 (*)    All other components within normal limits  CULTURE, BLOOD (ROUTINE X 2)  CULTURE, BLOOD (ROUTINE X 2)  RESP PANEL BY RT-PCR (FLU A&B, COVID) ARPGX2  PROTIME-INR  BRAIN NATRIURETIC PEPTIDE  C-REACTIVE PROTEIN  URINALYSIS, ROUTINE W REFLEX MICROSCOPIC  RAPID URINE DRUG SCREEN, HOSP PERFORMED  SEDIMENTATION RATE  TROPONIN I (HIGH SENSITIVITY)    ED ECG REPORT   Date: 04/28/2020  Rate: 81  Rhythm: normal sinus rhythm  QRS Axis: right  Intervals: PR prolonged  ST/T Wave abnormalities: normal  Conduction Disutrbances:none  Narrative Interpretation:   Old EKG Reviewed: changes noted from 02/15/2021, improved non-specific t-wave changes  I have personally reviewed the EKG tracing and agree with the computerized printout as noted.  Radiology DG Chest Port 1 View  Result Date: 04/28/2020 CLINICAL DATA:  Worsening heart failure. EXAM: PORTABLE CHEST 1 VIEW COMPARISON:  Chest x-ray 02/15/2020. FINDINGS: Mediastinum and hilar structures normal. Cardiomegaly. No pulmonary venous congestion. Interim near complete clearing of bilateral pulmonary infiltrates. No pleural effusion or pneumothorax. Mild scoliosis thoracic spine. IMPRESSION: 1. Interim near complete clearing of  bilateral pulmonary infiltrates. 2.  Cardiomegaly.  No pulmonary venous congestion. Electronically Signed   By: Maisie Fus  Register   On: 04/28/2020 16:31    Procedures Procedures   Medications Ordered in ED Medications  furosemide (LASIX) injection 20 mg (has no administration in time range)  oxyCODONE (  Oxy IR/ROXICODONE) immediate release tablet 5 mg (5 mg Oral Given 04/28/20 1521)    ED Course  I have reviewed the triage vital signs and the nursing notes.  Pertinent labs & imaging results that were available during my care of the patient were reviewed by me and considered in my medical decision making (see chart for details).  Patient seen and examined. Work-up initiated. Medications ordered.  Reviewed notes from previous hospitalizations.   Vital signs reviewed and are as follows: BP (!) 122/94 (BP Location: Left Arm)   Pulse (!) 106   Temp 98.1 F (36.7 C) (Oral)   Resp 18   SpO2 100%   Discussed with Dr. Silverio LayYao who will see.   5:44 PM Will discuss case with infectious disease.    6:11 PM Discussed with Dr. Elinor ParkinsonManandhar. States if patient does not appear septic, would hold abx for now. ID will plan to consult tomorrow.   Signout to Dr. Silverio LayYao at shift change.     MDM Rules/Calculators/A&P                          Admit.    Final Clinical Impression(s) / ED Diagnoses Final diagnoses:  Acute on chronic right-sided heart failure (HCC)  Rash  Cellulitis of both lower extremities    Rx / DC Orders ED Discharge Orders    None       Renne CriglerGeiple, Elridge Stemm, PA-C 04/28/20 1815    Charlynne PanderYao, David Hsienta, MD 04/28/20 [phone]

## 2020-04-29 ENCOUNTER — Observation Stay (HOSPITAL_COMMUNITY): Payer: Self-pay

## 2020-04-29 ENCOUNTER — Encounter: Payer: Self-pay | Admitting: General Practice

## 2020-04-29 ENCOUNTER — Observation Stay (HOSPITAL_BASED_OUTPATIENT_CLINIC_OR_DEPARTMENT_OTHER): Payer: Self-pay

## 2020-04-29 DIAGNOSIS — R609 Edema, unspecified: Secondary | ICD-10-CM

## 2020-04-29 DIAGNOSIS — I079 Rheumatic tricuspid valve disease, unspecified: Secondary | ICD-10-CM

## 2020-04-29 DIAGNOSIS — I776 Arteritis, unspecified: Secondary | ICD-10-CM

## 2020-04-29 DIAGNOSIS — F1990 Other psychoactive substance use, unspecified, uncomplicated: Secondary | ICD-10-CM

## 2020-04-29 DIAGNOSIS — B182 Chronic viral hepatitis C: Secondary | ICD-10-CM

## 2020-04-29 LAB — COMPREHENSIVE METABOLIC PANEL
ALT: 35 U/L (ref 0–44)
AST: 46 U/L — ABNORMAL HIGH (ref 15–41)
Albumin: 2.9 g/dL — ABNORMAL LOW (ref 3.5–5.0)
Alkaline Phosphatase: 179 U/L — ABNORMAL HIGH (ref 38–126)
Anion gap: 8 (ref 5–15)
BUN: 25 mg/dL — ABNORMAL HIGH (ref 6–20)
CO2: 29 mmol/L (ref 22–32)
Calcium: 8.9 mg/dL (ref 8.9–10.3)
Chloride: 102 mmol/L (ref 98–111)
Creatinine, Ser: 0.97 mg/dL (ref 0.61–1.24)
GFR, Estimated: >60 mL/min (ref >60)
Glucose, Bld: 116 mg/dL — ABNORMAL HIGH (ref 70–99)
Potassium: 4.2 mmol/L (ref 3.5–5.1)
Sodium: 139 mmol/L (ref 135–145)
Total Bilirubin: 0.7 mg/dL (ref 0.3–1.2)
Total Protein: 7.1 g/dL (ref 6.5–8.1)

## 2020-04-29 LAB — CBC
HCT: 36.9 % — ABNORMAL LOW (ref 39.0–52.0)
Hemoglobin: 11.7 g/dL — ABNORMAL LOW (ref 13.0–17.0)
MCH: 27.7 pg (ref 26.0–34.0)
MCHC: 31.7 g/dL (ref 30.0–36.0)
MCV: 87.4 fL (ref 80.0–100.0)
Platelets: 221 10*3/uL (ref 150–400)
RBC: 4.22 MIL/uL (ref 4.22–5.81)
RDW: 16.6 % — ABNORMAL HIGH (ref 11.5–15.5)
WBC: 6.4 10*3/uL (ref 4.0–10.5)
nRBC: 0 % (ref 0.0–0.2)

## 2020-04-29 LAB — ECHOCARDIOGRAM COMPLETE
Height: 72 in
Weight: 2088.2 oz

## 2020-04-29 LAB — MRSA PCR SCREENING: MRSA by PCR: POSITIVE — AB

## 2020-04-29 LAB — SEDIMENTATION RATE: Sed Rate: 36 mm/hr — ABNORMAL HIGH (ref 0–16)

## 2020-04-29 MED ORDER — OXYCODONE HCL 5 MG PO TABS
10.0000 mg | ORAL_TABLET | ORAL | Status: AC | PRN
  Administered 2020-04-29 – 2020-04-30 (×4): 10 mg via ORAL
  Filled 2020-04-29 (×4): qty 2

## 2020-04-29 MED ORDER — MUPIROCIN 2 % EX OINT
1.0000 "application " | TOPICAL_OINTMENT | Freq: Two times a day (BID) | CUTANEOUS | Status: AC
  Administered 2020-04-29 – 2020-05-03 (×10): 1 via NASAL
  Filled 2020-04-29 (×5): qty 22

## 2020-04-29 MED ORDER — CHLORHEXIDINE GLUCONATE CLOTH 2 % EX PADS
6.0000 | MEDICATED_PAD | Freq: Every day | CUTANEOUS | Status: AC
  Administered 2020-05-01 – 2020-05-03 (×3): 6 via TOPICAL

## 2020-04-29 MED ORDER — PREDNISONE 5 MG PO TABS
30.0000 mg | ORAL_TABLET | Freq: Every day | ORAL | Status: DC
  Administered 2020-04-29 – 2020-05-04 (×6): 30 mg via ORAL
  Filled 2020-04-29: qty 2
  Filled 2020-04-29 (×5): qty 1

## 2020-04-29 NOTE — Progress Notes (Signed)
Lower extremity venous has been completed.   Preliminary results in CV Proc.   Blanch Media 04/29/2020 8:20 AM

## 2020-04-29 NOTE — Progress Notes (Signed)
PROGRESS NOTE    Alex [name]  ZOX:[id number] DOB: 09-07-1985 DOA: 04/28/2020 PCP: Patient, No Pcp Per   Brief Narrative:  Alex [name] is a 35 y.o. 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  Patient with known tricuspid valve MRSA endocarditis with multiple recent admissions.  Most recently admitted 02/07/2020-02/12/2020 at which time he was found to have pulmonary septic emboli.  He was initially treated with IV vancomycin however developed a suspected drug rash and antibiotic with switched to daptomycin.  He received a dose of IV oritavancin on 02/11/2020 and was discharged on a 6-week course of oral doxycycline (patient did not fill until 04/16/2020).  During that hospitalization blood cultures remained negative. TTE 02/07/2020 again showed tricuspid valve endocarditis with severe TR. CT surgery recommended conservative nonsurgical management.    Patient states that over the last 2 weeks he has had progressive swelling in both of his lower extremities.  His legs have been feeling tight and he noticed worsening red rash.  Initially the areas appeared as blisters.  Area became pruritic and he says he was scratching his lower legs frequently.  He says this caused some open sores which are now scabbing over.  He is not sure if he seen any drainage from the area.  He filled his previous doxycycline prescription on 04/16/2020 which he started taking without improvement.  He says he walks a lot and has not had any shortness of breath except for when walking up a big hill.  He denies any other subjective fevers, chills, diaphoresis, chest pain, palpitations, nausea, vomiting,  abdominal pain, dysuria, or diarrhea.  He reports snorting opiates several days ago.  He denies any injection drug use over the last couple months.  He denies any recent cocaine or alcohol use.  He smokes half pack of cigarettes per day.  He denies using any recreational benzodiazepines.  ED Course:  Initial vitals showed BP 122/94, pulse 106, RR 18, temp 98.1 F, SPO2 100% on room air.  Labs show WBC 6.6, hemoglobin 11.1, platelets 224,000, sodium 138, potassium 4.3, bicarb 26, BUN 24, creatinine 0.94, serum glucose 102, AST 52, ALT 37, alkaline phosphatase 158, total bilirubin 0.9, lactic acid 2.3, BNP 182.3, high-sensitivity troponin I 4.  Blood cultures obtained and pending.  Urinalysis negative for UTI.  UDS obtained and pending.  SARS-CoV-2 PCR panel obtained and pending.  Portable chest x-ray personally reviewed and shows significant improvement of previous bilateral pulmonary infiltrates.  Patient was given IV Lasix 20 mg once and OxyIR 5 mg.  EDP discussed with on-call infectious disease who recommended medical admission and will plan to consult tomorrow.  Okay to hold antibiotics as patient not septic appearing.  EDP also discussed with on-call CT surgery who recommended repeat echocardiogram and vascular surgery who felt there was no vascular surgery needed at this time.  The hospitalist service was consulted to admit for further evaluation and management.  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  Blood cultures were negative at that time.  CTA showed pulmonary septic emboli.  Was initially on IV vancomycin which was discontinued and switched to daptomycin due to questionable drug rash.  Received 1 dose IV oritavancin and was  discharged with planned 6-week course of oral doxycycline which he did not fill until 04/16/2020.  Has progressive lower extremity edema without dyspnea or evidence of sepsis physiology on admission. -Follow blood cultures -Holding antibiotics at this time per ID recommendation.  Awaiting ID to see him.  Received 20 mg of IV Lasix in the ED.  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  Could also be related to hepatitis C or drug associated such as levamisole vasculitis or simply worsened related to aggressive scratching. EDP discussed with ID who recommended holding off on antibiotics at this time and will see in a.m. Doppler lower extremity negative for any DVT.      Polysubstance use disorder: Reports snorting opiates but no recent injection drug use.  UDS is positive for THC and amphetamines.  Denies recent alcohol or use of recreational benzodiazepines.  Smoking half pack cigarettes per day, nicotine patch provided.  Chronic hepatitis C: Untreated.  Follow-up with infectious disease.  DVT prophylaxis: enoxaparin (LOVENOX) injection 40 mg Start: 04/28/20 2200   Code Status: Full Code  Family Communication:  None present at bedside.  Plan of care discussed with patient in length and he verbalized understanding and agreed with it.  Status is: Observation  The patient will require care spanning > 2 midnights and should be moved to inpatient because: Inpatient level of care appropriate due to severity of illness  Dispo: The patient is from: Home              Anticipated d/c is to: Home              Anticipated d/c date is: 2 days              Patient currently is not medically stable to d/c.   Difficult to place patient No        Estimated body mass index is 17.7 kg/m as calculated from the following:   Height as of this  encounter: 6' (1.829 m).   Weight as of this encounter: 59.2 kg.      Nutritional status:               Consultants:   ID  Procedures:   None  Antimicrobials:  Anti-infectives (From admission, onward)   None         Subjective: Patient seen and examined.  Complains of pain, tenderness and a little bit of itching in bilateral lower extremities.  No other complaint.  Very anxious and fidgety.  Objective: Vitals:   04/28/20 2207 04/29/20 0226 04/29/20 0640 04/29/20 0926  BP: (!) 128/98 124/86 (!) 125/95 (!) 134/101  Pulse: 82 86 86 86  Resp: 16 16 16 18   Temp: 97.9 F (36.6 C) 98.2 F (36.8 C) 98.6 F (37 C) 98.5 F (36.9 C)  TempSrc: Oral Oral Oral Oral  SpO2: 100% 100% 100% 100%  Weight:      Height:        Intake/Output Summary (Last 24 hours) at 04/29/2020 1358 Last data filed at 04/29/2020 1314 Gross per 24 hour  Intake 480 ml  Output 675 ml  Net -195 ml   Filed Weights   04/28/20 2206  Weight: 59.2 kg    Examination:  General exam: Appears calm  and comfortable  Respiratory system: Clear to auscultation. Respiratory effort normal. Cardiovascular system: S1 & S2 heard, RRR. No JVD, murmurs, rubs, gallops or clicks. No pedal edema. Gastrointestinal system: Abdomen is nondistended, soft and nontender. No organomegaly or masses felt. Normal bowel sounds heard. Central nervous system: Alert and oriented. No focal neurological deficits. Extremities: Symmetric 5 x 5 power. Skin: Refer to the picture above. Psychiatry: Judgement and insight appear poor   Data Reviewed: I have personally reviewed following labs and imaging studies  CBC: Recent Labs  Lab 04/28/20 1600 04/29/20 0407  WBC 6.6 6.4  NEUTROABS 4.7  --   HGB 11.1* 11.7*  HCT 36.3* 36.9*  MCV 90.3 87.4  PLT 224 221   Basic Metabolic Panel: Recent Labs  Lab 04/28/20 1600 04/29/20 0407  NA 138 139  K 4.3 4.2  CL 101 102  CO2 26 29  GLUCOSE 102* 116*  BUN 24* 25*   CREATININE 0.94 0.97  CALCIUM 9.1 8.9   GFR: Estimated Creatinine Clearance: 89.9 mL/min (by C-G formula based on SCr of 0.97 mg/dL). Liver Function Tests: Recent Labs  Lab 04/28/20 1600 04/29/20 0407  AST 52* 46*  ALT 37 35  ALKPHOS 158* 179*  BILITOT 0.9 0.7  PROT 7.4 7.1  ALBUMIN 3.2* 2.9*   No results for input(s): LIPASE, AMYLASE in the last 168 hours. No results for input(s): AMMONIA in the last 168 hours. Coagulation Profile: Recent Labs  Lab 04/28/20 1600  INR 1.2   Cardiac Enzymes: No results for input(s): CKTOTAL, CKMB, CKMBINDEX, TROPONINI in the last 168 hours. BNP (last 3 results) No results for input(s): PROBNP in the last 8760 hours. HbA1C: No results for input(s): HGBA1C in the last 72 hours. CBG: No results for input(s): GLUCAP in the last 168 hours. Lipid Profile: No results for input(s): CHOL, HDL, LDLCALC, TRIG, CHOLHDL, LDLDIRECT in the last 72 hours. Thyroid Function Tests: No results for input(s): TSH, T4TOTAL, FREET4, T3FREE, THYROIDAB in the last 72 hours. Anemia Panel: No results for input(s): VITAMINB12, FOLATE, FERRITIN, TIBC, IRON, RETICCTPCT in the last 72 hours. Sepsis Labs: Recent Labs  Lab 04/28/20 1600  LATICACIDVEN 2.3*    Recent Results (from the past 240 hour(s))  Resp Panel by RT-PCR (Flu A&B, Covid) Nasopharyngeal Swab     Status: None   Collection Time: 04/28/20  6:56 PM   Specimen: Nasopharyngeal Swab; Nasopharyngeal(NP) swabs in vial transport medium  Result Value Ref Range Status   SARS Coronavirus 2 by RT PCR NEGATIVE NEGATIVE Final    Comment: (NOTE) SARS-CoV-2 target nucleic acids are NOT DETECTED.  The SARS-CoV-2 RNA is generally detectable in upper respiratory specimens during the acute phase of infection. The lowest concentration of SARS-CoV-2 viral copies this assay can detect is 138 copies/mL. A negative result does not preclude SARS-Cov-2 infection and should not be used as the sole basis for treatment  or other patient management decisions. A negative result may occur with  improper specimen collection/handling, submission of specimen other than nasopharyngeal swab, presence of viral mutation(s) within the areas targeted by this assay, and inadequate number of viral copies(<138 copies/mL). A negative result must be combined with clinical observations, patient history, and epidemiological information. The expected result is Negative.  Fact Sheet for Patients:  BloggerCourse.com  Fact Sheet for Healthcare Providers:  SeriousBroker.it  This test is no t yet approved or cleared by the [place] FDA and  has been authorized for detection and/or diagnosis of SARS-CoV-2 by FDA under an Emergency Use  Authorization (EUA). This EUA will remain  in effect (meaning this test can be used) for the duration of the COVID-19 declaration under Section 564(b)(1) of the Act, 21 U.S.C.section 360bbb-3(b)(1), unless the authorization is terminated  or revoked sooner.       Influenza A by PCR NEGATIVE NEGATIVE Final   Influenza B by PCR NEGATIVE NEGATIVE Final    Comment: (NOTE) The Xpert Xpress SARS-CoV-2/FLU/RSV plus assay is intended as an aid in the diagnosis of influenza from Nasopharyngeal swab specimens and should not be used as a sole basis for treatment. Nasal washings and aspirates are unacceptable for Xpert Xpress SARS-CoV-2/FLU/RSV testing.  Fact Sheet for Patients: BloggerCourse.comhttps://www.fda.gov/media/152166/download  Fact Sheet for Healthcare Providers: SeriousBroker.ithttps://www.fda.gov/media/152162/download  This test is not yet approved or cleared by the [place] FDA and has been authorized for detection and/or diagnosis of SARS-CoV-2 by FDA under an Emergency Use Authorization (EUA). This EUA will remain in effect (meaning this test can be used) for the duration of the COVID-19 declaration under Section 564(b)(1) of the Act, 21 U.S.C. section  360bbb-3(b)(1), unless the authorization is terminated or revoked.  Performed at [facility], [address] [place], [place] [address]   MRSA PCR Screening     Status: Abnormal   Collection Time: 04/28/20 11:57 PM   Specimen: Nasal Mucosa; Nasopharyngeal  Result Value Ref Range Status   MRSA by PCR POSITIVE (A) NEGATIVE Final    Comment:        The GeneXpert MRSA Assay (FDA approved for NASAL specimens only), is one component of a comprehensive MRSA colonization surveillance program. It is not intended to diagnose MRSA infection nor to guide or monitor treatment for MRSA infections. RESULT CALLED TO, READ BACK BY AND VERIFIED WITH: HENSON C. 02.18.22 @ 0223 BY MECIAL J. Performed at [facility], [address] [place], [place] [address]       Radiology Studies: [facility]  Result Date: 04/28/2020 CLINICAL DATA:  Worsening heart failure. EXAM: PORTABLE CHEST 1 VIEW COMPARISON:  Chest x-ray 02/15/2020. FINDINGS: Mediastinum and hilar structures normal. Cardiomegaly. No pulmonary venous congestion. Interim near complete clearing of bilateral pulmonary infiltrates. No pleural effusion or pneumothorax. Mild scoliosis thoracic spine. IMPRESSION: 1. Interim near complete clearing of bilateral pulmonary infiltrates. 2.  Cardiomegaly.  No pulmonary venous congestion. Electronically Signed   By: Maisie Fushomas  Register   On: 04/28/2020 16:31   VAS US LOWER EXTREMITY VENOUS (DVT)  Result Date: 04/29/2020  Lower Venous DVT Study Indications: Edema. Other Indications: Hx of endocarditis, IVDU. Comparison Study: no prior Performing Technologist: Blanch MediaMegan Riddle RVS  Examination Guidelines: A complete evaluation includes B-mode imaging, spectral Doppler, color Doppler, and power Doppler as needed of all accessible portions of each vessel. Bilateral testing is considered an integral part of a complete examination. Limited examinations for reoccurring  indications may be performed as noted. The reflux portion of the exam is performed with the patient in reverse Trendelenburg.  +---------+---------------+---------+-----------+----------+-------------------+ RIGHT    CompressibilityPhasicitySpontaneityPropertiesThrombus Aging      +---------+---------------+---------+-----------+----------+-------------------+ CFV      Full           Yes      Yes                                      +---------+---------------+---------+-----------+----------+-------------------+ SFJ      Full                                                             +---------+---------------+---------+-----------+----------+-------------------+  FV Prox  Full                                                             +---------+---------------+---------+-----------+----------+-------------------+ FV Mid   Full                                                             +---------+---------------+---------+-----------+----------+-------------------+ FV DistalFull                                                             +---------+---------------+---------+-----------+----------+-------------------+ PFV      Full                                                             +---------+---------------+---------+-----------+----------+-------------------+ POP      Full           Yes      Yes                                      +---------+---------------+---------+-----------+----------+-------------------+ PTV      Full                                                             +---------+---------------+---------+-----------+----------+-------------------+ PERO                                                  Not well visualized +---------+---------------+---------+-----------+----------+-------------------+   +---------+---------------+---------+-----------+----------+--------------+ LEFT      CompressibilityPhasicitySpontaneityPropertiesThrombus Aging +---------+---------------+---------+-----------+----------+--------------+ CFV      Full           Yes      Yes                                 +---------+---------------+---------+-----------+----------+--------------+ SFJ      Full                                                        +---------+---------------+---------+-----------+----------+--------------+ FV Prox  Full                                                        +---------+---------------+---------+-----------+----------+--------------+  FV Mid   Full                                                        +---------+---------------+---------+-----------+----------+--------------+ FV DistalFull                                                        +---------+---------------+---------+-----------+----------+--------------+ PFV      Full                                                        +---------+---------------+---------+-----------+----------+--------------+ POP      Full           Yes      Yes                                 +---------+---------------+---------+-----------+----------+--------------+ PTV      Full                                                        +---------+---------------+---------+-----------+----------+--------------+ PERO     Full                                                        +---------+---------------+---------+-----------+----------+--------------+     Summary: BILATERAL: - No evidence of deep vein thrombosis seen in the lower extremities, bilaterally. - No evidence of superficial venous thrombosis in the lower extremities, bilaterally. -No evidence of popliteal cyst, bilaterally. RIGHT: - Ultrasound characteristics of enlarged lymph nodes are noted in the groin.  LEFT: - Ultrasound characteristics of enlarged lymph nodes noted in the groin.  *See table(s) above for measurements  and observations.    Preliminary     Scheduled Meds: . Chlorhexidine Gluconate Cloth  6 each Topical Q0600  . enoxaparin (LOVENOX) injection  40 mg Subcutaneous Q24H  . mupirocin ointment  1 application Nasal BID  . nicotine  21 mg Transdermal Daily  . sodium chloride flush  3 mL Intravenous Q12H   Continuous Infusions:   LOS: 0 days   Time spent: 36-minute   Hughie Closs, MD [facility]  04/29/2020, 1:58 PM   To contact the attending provider between 7A-7P or the covering provider during after hours 7P-7A, please log into the web site [email or web address].

## 2020-04-29 NOTE — Consult Note (Signed)
[facility]    Date of Admission:  04/28/2020     Reason for Consult: Rash and history of endocarditis     Referring Physician: [facility] hospital[facility]  Current antibiotics: None  ASSESSMENT:    1. 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  Status post several days of IV vancomycin before getting a dose of oritavancin and approximately 3 weeks of doxycycline at discharge while incarcerated.  He has been relatively stable since the end of December 2021 off of antibiotics and chest x-ray shows near complete resolution of his pulmonary emboli.  He has no fevers or chills and current blood cultures are negative.  Although these cultures were drawn after he took about 5 days of doxycycline, I would suspect if he were bacteremic from endocarditis that these cultures would be positive nonetheless.  Repeat echocardiogram this admission shows a continued tricuspid valve vegetation measuring 1.1 x 0.6 cm, however, complete resorption of valvular vegetations by the end of antimicrobial therapy is uncommon even with successful treatment. 2. 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  Seems consistent with that seen with SAB.  Previously given a course of steroids with noted improvement per patient.  Unclear if this is the natural progression of his vasculitis in the setting of prior infection, some other underlying etiology, or possibly related to untreated hepatitis C. 3. Substance use disorder:  Will need long term plan for this 4. Right sided heart failure: Repeat Echo shows torrential TV regurgitation 5. Hepatitis C: LFTs  mildly elevated and needs treatment once stable outpatient  PLAN:    . Follow blood cultures but would continue to hold antibiotics as presentation is not consistent with recurrence of infective endocarditis . Will trial prednisone 30mg  daily for vasculitic rash to see if this helps . Recommend punch biopsy to confirm diagnosis . Recommend following up with CT surgery now that Echo has been done to see if any further recommendations . Will check ANCA, cryoglobulins, ANA as initial vasculitis work up . Diuresis per primary team . Will follow.  Plan for repeat blood cultures after some time off all antibiotics . Dr will be available over the weekend  HPI:    Alex [name] is a 35 y.o. 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  Patient has a known history of tricuspid valve endocarditis with previous admissions.  He had a prolonged hospitalization from 12/14/2019 through 01/19/2020 for tricuspid valve endocarditis due to MRSA with pulmonary septic emboli and paraspinal myositis as well as this complication of leukocytoclastic vasculitis as noted above.  He remained hospitalized during that admission for inpatient IV antibiotic therapy.  He was on vancomycin then transition to daptomycin for several weeks total and then ultimately discharged on linezolid for 2 weeks.  His adherence to linezolid was sporadic, however, he did complete 5 weeks of IV antibiotics during that admission with over 4 weeks from first negative blood  culture.  He was subsequently readmitted to the hospital from 02/07/2020 through 02/12/2020.  At that time he presented with acute  chest pain from jail where he was in custody due to multiple outstanding warrants.  He underwent CT scan at that time which showed multifocal bilateral peripheral predominant areas of nodularity, scarring, and architectural distortion consistent with sequela of septic emboli.  At that time most of the previously noted nodules demonstrated interval improvement from his previous exams.  However there was a new cavitary lung nodule within the posterior right lower lobe.  Repeat echocardiogram showed mobile vegetations on the posterior and anterior tricuspid valve (1.5 x 0.7 cm posterior and up to 2.2 x 0.5 cm anterior.  This was coupled with severe tricuspid regurg.  He was rechallenged that admission with vancomycin due to questionable drug allergy and he tolerated this well.  His blood cultures were negative.  He had MRI of the lumbar and thoracic spine which did not show any evidence of osteomyelitis or discitis.  He was given a dose of oritavancin on 12/2 and then discharged with the plan to complete 6 additional weeks of doxycycline.  He was discharged on 02/12/2020 to jail.  He followed up on 02/25/2020 in the ID clinic with Dr. Renold DonVu.  At that time his rash was noted to be improving with some areas of eschar and hyperpigmentation.  Patient reports he was in incarcerated for approximately 3 weeks after his discharge.  He received doxycycline during that time, but did not refill this prescription upon his discharge from jail.  He has been doing okay since his release.  He reports walking 2 to 3 miles per day without shortness of breath unless he is walking up a steep hill.  He has had no chest pain, fevers, chills.  Approximately 1 week ago, he noted worsening lower extremity swelling and rash.  He reported that this rash was painful and was causing him to scratch due to being pruritic.  Due to his symptoms, patient fill this prescription for doxycycline and Lasix and begin taking this medication.  Due to the  progression of his rash he subsequently presented to the emergency department overnight.  His labs on admission were notable for normal WBC, mildly elevated LFTs, elevated BNP, improved inflammatory markers.  Chest x-ray was personally reviewed and showed near complete clearing of his bilateral pulmonary infiltrates.  Blood cultures were obtained and are currently negative.  Antibiotics were held given his presentation did not appear consistent with sepsis.  Patient reports due to pain he is relapsed and reports snorting opiates.  He says he has not used needles for several months.   Past Medical History:  Diagnosis Date  . ADHD (attention deficit hyperactivity disorder)   . Allergy   . Asthma   . CHF (congestive heart failure) (HCC)   . Depressed   . 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 ETOH abuse   . History of suicidal tendencies   . Polysubstance abuse (HCC)     Social History   Tobacco Use  . Smoking status: Current Every Day Smoker    Packs/day: 1.00    Years: 10.00    Pack years: 10.00    Types: Cigarettes  . Smokeless tobacco: Never Used  Vaping Use  . Vaping Use: Never used  Substance Use Topics  . Alcohol use: Yes    Alcohol/week: 12.0 standard drinks    Types: 12 Cans of beer per week    Comment: 12 beers daily  .  Drug use: Yes    Frequency: 6.0 times per week    Types: Marijuana, Cocaine, Heroin, Fentanyl, IV    Comment: daily use cocaine (snort)    Family History  Problem Relation Age of Onset  . COPD Other     Allergies  Allergen Reactions  . Vancomycin Rash    Patient on vancomycin for 3 weeks, developed rash on his lower extremities. Did not resemble red man syndrome. Required change of abx    Review of Systems  Constitutional: Negative for chills and fever.  HENT: Negative.   Eyes: Negative.   Respiratory: Negative for cough and shortness of breath.   Cardiovascular: Positive for leg swelling. Negative for chest pain and orthopnea.   Gastrointestinal: Negative.   Genitourinary: Negative.   Musculoskeletal: Negative.   Skin: Positive for itching and rash.  Neurological: Negative.   Endo/Heme/Allergies: Negative.   Psychiatric/Behavioral: Positive for substance abuse.    OBJECTIVE:   Blood pressure (!) 125/95, pulse 86, temperature 98.6 F (37 C), temperature source Oral, resp. rate 16, height 6' (1.829 m), weight 59.2 kg, SpO2 100 %. Body mass index is 17.7 kg/m.  Physical Exam Constitutional:      General: He is not in acute distress.    Appearance: Normal appearance.  HENT:     Head: Normocephalic and atraumatic.  Eyes:     Extraocular Movements: Extraocular movements intact.     Conjunctiva/sclera: Conjunctivae normal.  Cardiovascular:     Rate and Rhythm: Normal rate and regular rhythm.     Heart sounds: Murmur heard.    Pulmonary:     Effort: Pulmonary effort is normal. No respiratory distress.     Breath sounds: Normal breath sounds.  Musculoskeletal:     Right lower leg: Edema present.     Left lower leg: Edema present.  Skin:    General: Skin is warm and dry.     Findings: Erythema and rash present.  Neurological:     General: No focal deficit present.     Mental Status: He is alert and oriented to person, place, and time.  Psychiatric:        Mood and Affect: Mood normal.        Behavior: Behavior normal.                Lab Results: Lab Results  Component Value Date   WBC 6.4 04/29/2020   HGB 11.7 (L) 04/29/2020   HCT 36.9 (L) 04/29/2020   MCV 87.4 04/29/2020   PLT 221 04/29/2020    Lab Results  Component Value Date   NA 139 04/29/2020   K 4.2 04/29/2020   CO2 29 04/29/2020   GLUCOSE 116 (H) 04/29/2020   BUN 25 (H) 04/29/2020   CREATININE 0.97 04/29/2020   CALCIUM 8.9 04/29/2020   GFRNONAA >60 04/29/2020   GFRAA >60 12/15/2019    Lab Results  Component Value Date   ALT 35 04/29/2020   AST 46 (H) 04/29/2020   ALKPHOS 179 (H) 04/29/2020   BILITOT 0.7  04/29/2020       Component Value Date/Time   CRP 6.6 (H) 04/28/2020 1600       Component Value Date/Time   ESRSEDRATE 36 (H) 04/28/2020 2249    I have reviewed the micro and lab results in Epic.  Imaging: DG Chest Port 1 View  Result Date: 04/28/2020 CLINICAL DATA:  Worsening heart failure. EXAM: PORTABLE CHEST 1 VIEW COMPARISON:  Chest x-ray 02/15/2020. FINDINGS: Mediastinum and hilar structures normal.  Cardiomegaly. No pulmonary venous congestion. Interim near complete clearing of bilateral pulmonary infiltrates. No pleural effusion or pneumothorax. Mild scoliosis thoracic spine. IMPRESSION: 1. Interim near complete clearing of bilateral pulmonary infiltrates. 2.  Cardiomegaly.  No pulmonary venous congestion. Electronically Signed   By: Maisie Fus  Register   On: 04/28/2020 16:31   VAS [place] LOWER EXTREMITY VENOUS (DVT)  Result Date: 04/29/2020  Lower Venous DVT Study Indications: Edema. Other Indications: Hx of endocarditis, IVDU. Comparison Study: no prior Performing Technologist: Blanch Media RVS  Examination Guidelines: A complete evaluation includes B-mode imaging, spectral Doppler, color Doppler, and power Doppler as needed of all accessible portions of each vessel. Bilateral testing is considered an integral part of a complete examination. Limited examinations for reoccurring indications may be performed as noted. The reflux portion of the exam is performed with the patient in reverse Trendelenburg.  +---------+---------------+---------+-----------+----------+-------------------+ RIGHT    CompressibilityPhasicitySpontaneityPropertiesThrombus Aging      +---------+---------------+---------+-----------+----------+-------------------+ CFV      Full           Yes      Yes                                      +---------+---------------+---------+-----------+----------+-------------------+ SFJ      Full                                                              +---------+---------------+---------+-----------+----------+-------------------+ FV Prox  Full                                                             +---------+---------------+---------+-----------+----------+-------------------+ FV Mid   Full                                                             +---------+---------------+---------+-----------+----------+-------------------+ FV DistalFull                                                             +---------+---------------+---------+-----------+----------+-------------------+ PFV      Full                                                             +---------+---------------+---------+-----------+----------+-------------------+ POP      Full           Yes      Yes                                      +---------+---------------+---------+-----------+----------+-------------------+  PTV      Full                                                             +---------+---------------+---------+-----------+----------+-------------------+ PERO                                                  Not well visualized +---------+---------------+---------+-----------+----------+-------------------+   +---------+---------------+---------+-----------+----------+--------------+ LEFT     CompressibilityPhasicitySpontaneityPropertiesThrombus Aging +---------+---------------+---------+-----------+----------+--------------+ CFV      Full           Yes      Yes                                 +---------+---------------+---------+-----------+----------+--------------+ SFJ      Full                                                        +---------+---------------+---------+-----------+----------+--------------+ FV Prox  Full                                                        +---------+---------------+---------+-----------+----------+--------------+ FV Mid   Full                                                         +---------+---------------+---------+-----------+----------+--------------+ FV DistalFull                                                        +---------+---------------+---------+-----------+----------+--------------+ PFV      Full                                                        +---------+---------------+---------+-----------+----------+--------------+ POP      Full           Yes      Yes                                 +---------+---------------+---------+-----------+----------+--------------+ PTV      Full                                                        +---------+---------------+---------+-----------+----------+--------------+  PERO     Full                                                        +---------+---------------+---------+-----------+----------+--------------+     Summary: BILATERAL: - No evidence of deep vein thrombosis seen in the lower extremities, bilaterally. - No evidence of superficial venous thrombosis in the lower extremities, bilaterally. -No evidence of popliteal cyst, bilaterally. RIGHT: - Ultrasound characteristics of enlarged lymph nodes are noted in the groin.  LEFT: - Ultrasound characteristics of enlarged lymph nodes noted in the groin.  *See table(s) above for measurements and observations.    Preliminary      Imaging independently reviewed in Epic.  Vedia Coffer for Infectious Disease [facility] [phone] pager 04/29/2020, 8:49 AM  I spent greater than 110 minutes with the patient including greater than 50% of time in face to face counsel of the patient and in coordination of their care.

## 2020-04-29 NOTE — Progress Notes (Signed)
Patient states it is difficult to walk because of leg pain related to bilateral leg wounds that are stiff and crack with movement. Patient would benefit from wound care consult to give direction on how to address care.

## 2020-04-29 NOTE — Progress Notes (Signed)
  Echocardiogram 2D Echocardiogram has been performed.  Pieter Partridge 04/29/2020, 12:35 PM

## 2020-04-30 LAB — ANA W/REFLEX IF POSITIVE: Anti Nuclear Antibody (ANA): NEGATIVE

## 2020-04-30 LAB — MPO/PR-3 (ANCA) ANTIBODIES
ANCA Proteinase 3: <3.5 U/mL (ref 0.0–3.5)
Myeloperoxidase Abs: <9 U/mL (ref 0.0–9.0)

## 2020-04-30 MED ORDER — OXYCODONE HCL 5 MG PO TABS
10.0000 mg | ORAL_TABLET | ORAL | Status: DC | PRN
  Administered 2020-04-30 – 2020-05-01 (×6): 10 mg via ORAL
  Filled 2020-04-30 (×6): qty 2

## 2020-04-30 MED ORDER — MELATONIN 3 MG PO TABS
3.0000 mg | ORAL_TABLET | Freq: Every day | ORAL | Status: DC
  Administered 2020-04-30 – 2020-05-03 (×4): 3 mg via ORAL
  Filled 2020-04-30 (×4): qty 1

## 2020-04-30 NOTE — Progress Notes (Signed)
PROGRESS NOTE    Alex [name]  ZOX:[id number] DOB: 09-07-1985 DOA: 04/28/2020 PCP: Patient, No Pcp Per   Brief Narrative:  Alex [name] is a 35 y.o. 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  Patient with known tricuspid valve MRSA endocarditis with multiple recent admissions.  Most recently admitted 02/07/2020-02/12/2020 at which time he was found to have pulmonary septic emboli.  He was initially treated with IV vancomycin however developed a suspected drug rash and antibiotic with switched to daptomycin.  He received a dose of IV oritavancin on 02/11/2020 and was discharged on a 6-week course of oral doxycycline (patient did not fill until 04/16/2020).  During that hospitalization blood cultures remained negative. TTE 02/07/2020 again showed tricuspid valve endocarditis with severe TR. CT surgery recommended conservative nonsurgical management.    Patient states that over the last 2 weeks he has had progressive swelling in both of his lower extremities.  His legs have been feeling tight and he noticed worsening red rash.  Initially the areas appeared as blisters.  Area became pruritic and he says he was scratching his lower legs frequently.  He says this caused some open sores which are now scabbing over.  He is not sure if he seen any drainage from the area.  He filled his previous doxycycline prescription on 04/16/2020 which he started taking without improvement.  He says he walks a lot and has not had any shortness of breath except for when walking up a big hill.  He denies any other subjective fevers, chills, diaphoresis, chest pain, palpitations, nausea, vomiting,  abdominal pain, dysuria, or diarrhea.  He reports snorting opiates several days ago.  He denies any injection drug use over the last couple months.  He denies any recent cocaine or alcohol use.  He smokes half pack of cigarettes per day.  He denies using any recreational benzodiazepines.  ED Course:  Initial vitals showed BP 122/94, pulse 106, RR 18, temp 98.1 F, SPO2 100% on room air.  Labs show WBC 6.6, hemoglobin 11.1, platelets 224,000, sodium 138, potassium 4.3, bicarb 26, BUN 24, creatinine 0.94, serum glucose 102, AST 52, ALT 37, alkaline phosphatase 158, total bilirubin 0.9, lactic acid 2.3, BNP 182.3, high-sensitivity troponin I 4.  Blood cultures obtained and pending.  Urinalysis negative for UTI.  UDS obtained and pending.  SARS-CoV-2 PCR panel obtained and pending.  Portable chest x-ray personally reviewed and shows significant improvement of previous bilateral pulmonary infiltrates.  Patient was given IV Lasix 20 mg once and OxyIR 5 mg.  EDP discussed with on-call infectious disease who recommended medical admission and will plan to consult tomorrow.  Okay to hold antibiotics as patient not septic appearing.  EDP also discussed with on-call CT surgery who recommended repeat echocardiogram and vascular surgery who felt there was no vascular surgery needed at this time.  The hospitalist service was consulted to admit for further evaluation and management.  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  Blood cultures were negative at that time.  CTA showed pulmonary septic emboli.  Was initially on IV vancomycin which was discontinued and switched to daptomycin due to questionable drug rash.  Received 1 dose IV oritavancin and was  discharged with planned 6-week course of oral doxycycline which he did not fill until 04/16/2020.  Has progressive lower extremity edema without dyspnea or evidence of sepsis physiology on admission. -Follow blood cultures -Holding antibiotics at this time per ID recommendation.  Received 20 mg of IV Lasix in the ED.  Leukoclassic vasculitis bilateral lower extremities: Could be secondary to combination of subacute/chronic endocarditis, hepatitis C or drug associated such as levamisole vasculitis or simply worsened related to aggressive scratching.  Patient has been started on prednisone 30 mg p.o. daily by ID.  I see some improvement today and his skin is drying up now.  No new lesions.  Will resume oxycodone for pain control.  Polysubstance use disorder: Reports snorting opiates but no recent injection drug use.  UDS is positive for THC and amphetamines.  Denies recent alcohol or use of recreational benzodiazepines.  Smoking half pack cigarettes per day, nicotine patch provided.  Chronic hepatitis C: Untreated.  Follow-up with infectious disease.  DVT prophylaxis: enoxaparin (LOVENOX) injection 40 mg Start: 04/28/20 2200   Code Status: Full Code  Family Communication:  None present at bedside.  Plan of care discussed with patient in length and he verbalized understanding and agreed with it.  Status is: Inpatient  Remains inpatient appropriate because:Inpatient level of care appropriate due to severity of illness   Dispo: The patient is from: Home              Anticipated d/c is to: Home              Anticipated d/c date is: 2 days              Patient currently is not medically stable to d/c.   Difficult to place patient No    Estimated body mass index is 19.46 kg/m as calculated from the following:   Height as of this encounter: 6' (1.829 m).   Weight as of this encounter: 65.1 kg.      Nutritional status:               Consultants:   ID  Procedures:    None  Antimicrobials:  Anti-infectives (From admission, onward)   None         Subjective: Seen and examined.  Still complains of bilateral lower extremity pain but looks very comfortable.  No signs of opioid withdrawal.  No other complaint.  Objective: Vitals:   04/29/20 1506 04/29/20 2053 04/30/20 0500 04/30/20 0514  BP: (!) 134/93 110/85  (!) 129/94  Pulse: 95 91  74  Resp: Temp: 99 F (37.2 C) 98.5 F (36.9 C)  97.8 F (36.6 C)  TempSrc: Oral Oral  Oral  SpO2: 100% 99%  100%  Weight:   65.1 kg   Height:        Intake/Output Summary (Last 24 hours) at 04/30/2020 1150 Last data filed at 04/30/2020 1131 Gross per 24 hour  Intake 843 ml  Output 1200 ml  Net -357 ml   Filed Weights   04/28/20 2206 04/30/20 0500  Weight: 59.2 kg 65.1 kg    Examination:  General exam: Appears fidgety but comfortable Respiratory system: Clear to auscultation. Respiratory effort normal. Cardiovascular system: S1 & S2 heard, RRR. No JVD, murmurs, rubs, gallops or clicks. No pedal edema. Gastrointestinal system: Abdomen is nondistended, soft  and nontender. No organomegaly or masses felt. Normal bowel sounds heard. Central nervous system: Alert and oriented. No focal neurological deficits. Extremities: Symmetric 5 x 5 power. Skin: Palpable purpura bilateral lower extremity. Psychiatry: Judgement and insight appear poor  Data Reviewed: I have personally reviewed following labs and imaging studies  CBC: Recent Labs  Lab 04/28/20 1600 04/29/20 0407  WBC 6.6 6.4  NEUTROABS 4.7  --   HGB 11.1* 11.7*  HCT 36.3* 36.9*  MCV 90.3 87.4  PLT 224 221   Basic Metabolic Panel: Recent Labs  Lab 04/28/20 1600 04/29/20 0407  NA 138 139  K 4.3 4.2  CL 101 102  CO2 26 29  GLUCOSE 102* 116*  BUN 24* 25*  CREATININE 0.94 0.97  CALCIUM 9.1 8.9   GFR: Estimated Creatinine Clearance: 98.8 mL/min (by C-G formula based on SCr of 0.97 mg/dL). Liver Function Tests: Recent  Labs  Lab 04/28/20 1600 04/29/20 0407  AST 52* 46*  ALT 37 35  ALKPHOS 158* 179*  BILITOT 0.9 0.7  PROT 7.4 7.1  ALBUMIN 3.2* 2.9*   No results for input(s): LIPASE, AMYLASE in the last 168 hours. No results for input(s): AMMONIA in the last 168 hours. Coagulation Profile: Recent Labs  Lab 04/28/20 1600  INR 1.2   Cardiac Enzymes: No results for input(s): CKTOTAL, CKMB, CKMBINDEX, TROPONINI in the last 168 hours. BNP (last 3 results) No results for input(s): PROBNP in the last 8760 hours. HbA1C: No results for input(s): HGBA1C in the last 72 hours. CBG: No results for input(s): GLUCAP in the last 168 hours. Lipid Profile: No results for input(s): CHOL, HDL, LDLCALC, TRIG, CHOLHDL, LDLDIRECT in the last 72 hours. Thyroid Function Tests: No results for input(s): TSH, T4TOTAL, FREET4, T3FREE, THYROIDAB in the last 72 hours. Anemia Panel: No results for input(s): VITAMINB12, FOLATE, FERRITIN, TIBC, IRON, RETICCTPCT in the last 72 hours. Sepsis Labs: Recent Labs  Lab 04/28/20 1600  LATICACIDVEN 2.3*    Recent Results (from the past 240 hour(s))  Blood culture (routine x 2)     Status: None (Preliminary result)   Collection Time: 04/28/20  4:00 PM   Specimen: BLOOD RIGHT FOREARM  Result Value Ref Range Status   Specimen Description   Final    BLOOD RIGHT FOREARM Performed at [facility], [address] [place], [place] [address]    Special Requests   Final    BOTTLES DRAWN AEROBIC AND ANAEROBIC Blood Culture results may not be optimal due to an inadequate volume of blood received in culture bottles Performed at [facility], [address] [place], [place] [address]    Culture   Final    NO GROWTH 1 DAY Performed at [facility], [address] [place], [place] [address]    Report Status PENDING  Incomplete  Resp Panel by RT-PCR (Flu A&B, Covid) Nasopharyngeal Swab     Status: None   Collection Time: 04/28/20  6:56  PM   Specimen: Nasopharyngeal Swab; Nasopharyngeal(NP) swabs in vial transport medium  Result Value Ref Range Status   SARS Coronavirus 2 by RT PCR NEGATIVE NEGATIVE Final    Comment: (NOTE) SARS-CoV-2 target nucleic acids are NOT DETECTED.  The SARS-CoV-2 RNA is generally detectable in upper respiratory specimens during the acute phase of infection. The lowest concentration of SARS-CoV-2 viral copies this assay can detect is 138 copies/mL. A negative result does not preclude SARS-Cov-2 infection and should not be used as the sole basis for  treatment or other patient management decisions. A negative result may occur with  improper specimen collection/handling, submission of specimen other than nasopharyngeal swab, presence of viral mutation(s) within the areas targeted by this assay, and inadequate number of viral copies(<138 copies/mL). A negative result must be combined with clinical observations, patient history, and epidemiological information. The expected result is Negative.  Fact Sheet for Patients:  BloggerCourse.comhttps://www.fda.gov/media/152166/download  Fact Sheet for Healthcare Providers:  SeriousBroker.ithttps://www.fda.gov/media/152162/download  This test is no t yet approved or cleared by the [place] FDA and  has been authorized for detection and/or diagnosis of SARS-CoV-2 by FDA under an Emergency Use Authorization (EUA). This EUA will remain  in effect (meaning this test can be used) for the duration of the COVID-19 declaration under Section 564(b)(1) of the Act, 21 U.S.C.section 360bbb-3(b)(1), unless the authorization is terminated  or revoked sooner.       Influenza A by PCR NEGATIVE NEGATIVE Final   Influenza B by PCR NEGATIVE NEGATIVE Final    Comment: (NOTE) The Xpert Xpress SARS-CoV-2/FLU/RSV plus assay is intended as an aid in the diagnosis of influenza from Nasopharyngeal swab specimens and should not be used as a sole basis for treatment. Nasal washings and aspirates are  unacceptable for Xpert Xpress SARS-CoV-2/FLU/RSV testing.  Fact Sheet for Patients: BloggerCourse.comhttps://www.fda.gov/media/152166/download  Fact Sheet for Healthcare Providers: SeriousBroker.ithttps://www.fda.gov/media/152162/download  This test is not yet approved or cleared by the [place] FDA and has been authorized for detection and/or diagnosis of SARS-CoV-2 by FDA under an Emergency Use Authorization (EUA). This EUA will remain in effect (meaning this test can be used) for the duration of the COVID-19 declaration under Section 564(b)(1) of the Act, 21 U.S.C. section 360bbb-3(b)(1), unless the authorization is terminated or revoked.  Performed at [facility], [address] [place], [place] [address]   Blood culture (routine x 2)     Status: None (Preliminary result)   Collection Time: 04/28/20 10:49 PM   Specimen: BLOOD LEFT HAND  Result Value Ref Range Status   Specimen Description   Final    BLOOD LEFT HAND Performed at [facility], [address] [place], [place] [address]    Special Requests   Final    BOTTLES DRAWN AEROBIC ONLY Blood Culture results may not be optimal due to an inadequate volume of blood received in culture bottles Performed at [facility], [address] [place], [place] [address]    Culture   Final    NO GROWTH 1 DAY Performed at [facility], [address] [place], [place] [address]    Report Status PENDING  Incomplete  MRSA PCR Screening     Status: Abnormal   Collection Time: 04/28/20 11:57 PM   Specimen: Nasal Mucosa; Nasopharyngeal  Result Value Ref Range Status   MRSA by PCR POSITIVE (A) NEGATIVE Final    Comment:        The GeneXpert MRSA Assay (FDA approved for NASAL specimens only), is one component of a comprehensive MRSA colonization surveillance program. It is not intended to diagnose MRSA infection nor to guide or monitor treatment for MRSA infections. RESULT CALLED TO, READ  BACK BY AND VERIFIED WITH: HENSON C. 02.18.22 @ 0223 BY MECIAL J. Performed at [facility], [address] [place], [place] [address]       [facility]  Result Date: 04/28/2020 CLINICAL DATA:  Worsening heart failure. EXAM: PORTABLE CHEST 1 VIEW COMPARISON:  Chest x-ray 02/15/2020. FINDINGS: Mediastinum and hilar structures normal. Cardiomegaly. No pulmonary venous congestion. Interim near complete clearing of bilateral pulmonary infiltrates. No pleural effusion or pneumothorax. Mild scoliosis thoracic spine. IMPRESSION: 1. Interim near complete clearing of bilateral pulmonary infiltrates. 2.  Cardiomegaly.  No pulmonary venous congestion. Electronically Signed   By: Maisie Fus  Register   On: 04/28/2020 16:31   ECHOCARDIOGRAM COMPLETE  Result Date: 04/29/2020    ECHOCARDIOGRAM REPORT   Patient Name:   DAMACIO WEISGERBER Date of Exam: 04/29/2020 Medical Rec #:  [id number]      Height:       72.0 in Accession #:    [id number]     Weight:       130.5 lb Date of Birth:  12-Jul-1985      BSA:          1.777 m Patient Age:    34 years       BP:           134/101 mmHg Patient Gender: M              HR:           86 bpm. Exam Location:  Inpatient Procedure: 2D Echo, Cardiac Doppler and Color Doppler Indications:    Endocarditis  History:        Patient has prior history of Echocardiogram examinations, most                 recent 02/07/2020. Endocarditis and TV endocarditis,                 Signs/Symptoms:Bacteremia; Risk Factors:Current Smoker. MRSA,                 polysubstance abuse, Pul.septic embolic, Hep. C.  Sonographer:    Lavenia Atlas Referring Phys: [id number] VISHAL R PATEL IMPRESSIONS  1. Left ventricular ejection fraction, by estimation, is 60 to 65%. The left ventricle has normal function. The left ventricle has no regional wall motion abnormalities. Left ventricular diastolic parameters are indeterminate.  2. Right ventricular systolic function is normal.  The right ventricular size is severely enlarged.  3. Right atrial size was severely dilated.  4. The mitral valve is normal in structure. No evidence of mitral valve regurgitation. No evidence of mitral stenosis.  5. Tricuspid valve vegetation, mobile mass (1.1 x 0.6 cm). The tricuspid valve is abnormal. Tricuspid valve regurgitation is torrential.  6. The aortic valve is normal in structure. Aortic valve regurgitation is not visualized. No aortic stenosis is present.  7. The inferior vena cava is dilated in size with <50% respiratory variability, suggesting right atrial pressure of 15 mmHg. FINDINGS  Left Ventricle: Left ventricular ejection fraction, by estimation, is 60 to 65%. The left ventricle has normal function. The left ventricle has no regional wall motion abnormalities. The left ventricular internal cavity size was normal in size. There is  no left ventricular hypertrophy. Left ventricular diastolic parameters are indeterminate. Right Ventricle: The right ventricular size is severely enlarged. No increase in right ventricular wall thickness. Right ventricular systolic function is normal. Left Atrium: Left atrial size was normal in size. Right Atrium: Right atrial size was severely dilated. Pericardium: There is no evidence of pericardial effusion. Mitral Valve: The mitral valve is normal in structure. No evidence of mitral valve regurgitation. No evidence of mitral valve stenosis. Tricuspid Valve: Tricuspid valve vegetation, mobile mass (1.1 x 0.6 cm). The tricuspid valve is abnormal. Tricuspid valve regurgitation is torrential. No evidence of tricuspid  stenosis. Aortic Valve: The aortic valve is normal in structure. Aortic valve regurgitation is not visualized. No aortic stenosis is present. Pulmonic Valve: The pulmonic valve was normal in structure. Pulmonic valve regurgitation is not visualized. No evidence of pulmonic stenosis. Aorta: The aortic root is normal in size and structure. Venous: The  inferior vena cava is dilated in size with less than 50% respiratory variability, suggesting right atrial pressure of 15 mmHg. IAS/Shunts: No atrial level shunt detected by color flow Doppler. Donato Schultz MD Electronically signed by Donato Schultz MD Signature Date/Time: 04/29/2020/3:21:12 PM    Final    VAS [place] LOWER EXTREMITY VENOUS (DVT)  Result Date: 04/30/2020  Lower Venous DVT Study Indications: Edema. Other Indications: Hx of endocarditis, IVDU. Comparison Study: no prior Performing Technologist: Blanch Media RVS  Examination Guidelines: A complete evaluation includes B-mode imaging, spectral Doppler, color Doppler, and power Doppler as needed of all accessible portions of each vessel. Bilateral testing is considered an integral part of a complete examination. Limited examinations for reoccurring indications may be performed as noted. The reflux portion of the exam is performed with the patient in reverse Trendelenburg.  +---------+---------------+---------+-----------+----------+-------------------+ RIGHT    CompressibilityPhasicitySpontaneityPropertiesThrombus Aging      +---------+---------------+---------+-----------+----------+-------------------+ CFV      Full           Yes      Yes                                      +---------+---------------+---------+-----------+----------+-------------------+ SFJ      Full                                                             +---------+---------------+---------+-----------+----------+-------------------+ FV Prox  Full                                                             +---------+---------------+---------+-----------+----------+-------------------+ FV Mid   Full                                                             +---------+---------------+---------+-----------+----------+-------------------+ FV DistalFull                                                              +---------+---------------+---------+-----------+----------+-------------------+ PFV      Full                                                             +---------+---------------+---------+-----------+----------+-------------------+  POP      Full           Yes      Yes                                      +---------+---------------+---------+-----------+----------+-------------------+ PTV      Full                                                             +---------+---------------+---------+-----------+----------+-------------------+ PERO                                                  Not well visualized +---------+---------------+---------+-----------+----------+-------------------+   +---------+---------------+---------+-----------+----------+--------------+ LEFT     CompressibilityPhasicitySpontaneityPropertiesThrombus Aging +---------+---------------+---------+-----------+----------+--------------+ CFV      Full           Yes      Yes                                 +---------+---------------+---------+-----------+----------+--------------+ SFJ      Full                                                        +---------+---------------+---------+-----------+----------+--------------+ FV Prox  Full                                                        +---------+---------------+---------+-----------+----------+--------------+ FV Mid   Full                                                        +---------+---------------+---------+-----------+----------+--------------+ FV DistalFull                                                        +---------+---------------+---------+-----------+----------+--------------+ PFV      Full                                                        +---------+---------------+---------+-----------+----------+--------------+ POP      Full           Yes      Yes                                  +---------+---------------+---------+-----------+----------+--------------+  PTV      Full                                                        +---------+---------------+---------+-----------+----------+--------------+ PERO     Full                                                        +---------+---------------+---------+-----------+----------+--------------+     Summary: BILATERAL: - No evidence of deep vein thrombosis seen in the lower extremities, bilaterally. - No evidence of superficial venous thrombosis in the lower extremities, bilaterally. -No evidence of popliteal cyst, bilaterally. RIGHT: - Ultrasound characteristics of enlarged lymph nodes are noted in the groin.  LEFT: - Ultrasound characteristics of enlarged lymph nodes noted in the groin.  *See table(s) above for measurements and observations. Electronically signed by Heath Lark on 04/30/2020 at 10:54:10 AM.    Final     Scheduled Meds: . Chlorhexidine Gluconate Cloth  6 each Topical Q0600  . enoxaparin (LOVENOX) injection  40 mg Subcutaneous Q24H  . mupirocin ointment  1 application Nasal BID  . nicotine  21 mg Transdermal Daily  . predniSONE  30 mg Oral Q breakfast  . sodium chloride flush  3 mL Intravenous Q12H   Continuous Infusions:   LOS: 0 days   Time spent: 30-minute   Hughie Closs, MD [facility]  04/30/2020, 11:50 AM   To contact the attending provider between 7A-7P or the covering provider during after hours 7P-7A, please log into the web site [email or web address].

## 2020-04-30 NOTE — Plan of Care (Signed)

## 2020-04-30 NOTE — Progress Notes (Signed)
   04/30/20 2115  Integumentary  Integumentary (WDL) X  Skin Color Pale  Skin Condition Dry  Skin Integrity Cracking;Rash  Cracking Location Leg  Cracking Location Orientation Right;Left;Lower  Cracking Intervention Other (Comment) (monitor)  Rash Location Arm;Knee;Leg  In addition, blackened areas noted on bilateral legs extending to feet.  Dressing changes to be done per md order.  Some small amount of bleeding noted.  Scabbed areas noted on all extremities and face as well.

## 2020-04-30 NOTE — Progress Notes (Signed)
   04/29/20 2320  Integumentary  Skin Color Pale  Skin Condition Dry  Skin Integrity Cracking  Cracking Location Leg  Cracking Location Orientation Right;Left;Lower  Petechiae Location Leg;Hand;Knee  Petechiae Location Orientation Right;Left  Petechiae Intervention Other (Comment) (monitor)  Rash Location Arm;Leg  Rash Location Orientation Right;Left  Rash Intervention Other (Comment) (monitor)  Pt with scabs noted on all extremities; pt is unsure where they came from.  Rashes noted on bilateral knees and thighs, scattered on arms. No complaints of itching at this time.

## 2020-04-30 NOTE — Consult Note (Signed)
WOC Nurse Consult Note: Reason for Consult: Vasculitis, atypical LE wounds Wound type: infectious Pressure Injury POA: NA Measurement: See below for description, diffuse presentation Wound bed: See photos in EHR.  Bilateral anterior and medial LEs with dried serum vs eschar, Right medial and anterior foot with eschar and blue discoloration to subcutaneous tissues.  Erythematous rash presentation bilateral from feet to above knees, also arms. Drainage (amount, consistency, odor) scant serous Periwound: As noted above Dressing procedure/placement/frequency: I have communicated with Dr. Jacqulyn Bath via Secure Chat that the etiology and severity of this patient's vasculitis was outside the scope of WOC nursing, but that conservative topical care orders are something with which I could assist.  For that I have selected and Ordered antimicrobial nonadherent dressings (xeroform) to be applied twice daily following a NS cleanse and gentle, but thorough drying. Dressings are to be topped with dry gauze or ABD pads for the larger areas. Securement will be with Kerlix roll gauze and paper tape.   I have noted the ID recommendation for a biopsy and indicated in my communication to Dr. Jacqulyn Bath that I am not sure if Dermatology or General Surgery could assist with that.  I also advised that Vascular Surgery may be of some assistance and I noted my greatest area of concern was the right anterior and medial foot.  WOC nursing team will not follow, but will remain available to this patient, the nursing and medical teams.  Please re-consult if needed. Thanks, Ladona Mow, MSN, RN, GNP, [place]  Pager# [phone]

## 2020-05-01 LAB — CBC WITH DIFFERENTIAL/PLATELET
Abs Immature Granulocytes: 0.02 10*3/uL (ref 0.00–0.07)
Basophils Absolute: 0 10*3/uL (ref 0.0–0.1)
Basophils Relative: 0 %
Eosinophils Absolute: 0 10*3/uL (ref 0.0–0.5)
Eosinophils Relative: 0 %
HCT: 36.8 % — ABNORMAL LOW (ref 39.0–52.0)
Hemoglobin: 11.6 g/dL — ABNORMAL LOW (ref 13.0–17.0)
Immature Granulocytes: 0 %
Lymphocytes Relative: 28 %
Lymphs Abs: 2.8 10*3/uL (ref 0.7–4.0)
MCH: 27.5 pg (ref 26.0–34.0)
MCHC: 31.5 g/dL (ref 30.0–36.0)
MCV: 87.2 fL (ref 80.0–100.0)
Monocytes Absolute: 0.8 10*3/uL (ref 0.1–1.0)
Monocytes Relative: 8 %
Neutro Abs: 6.5 10*3/uL (ref 1.7–7.7)
Neutrophils Relative %: 64 %
Platelets: 245 10*3/uL (ref 150–400)
RBC: 4.22 MIL/uL (ref 4.22–5.81)
RDW: 16 % — ABNORMAL HIGH (ref 11.5–15.5)
WBC: 10.2 10*3/uL (ref 4.0–10.5)
nRBC: 0 % (ref 0.0–0.2)

## 2020-05-01 LAB — COMPREHENSIVE METABOLIC PANEL
ALT: 34 U/L (ref 0–44)
AST: 36 U/L (ref 15–41)
Albumin: 2.7 g/dL — ABNORMAL LOW (ref 3.5–5.0)
Alkaline Phosphatase: 162 U/L — ABNORMAL HIGH (ref 38–126)
Anion gap: 6 (ref 5–15)
BUN: 24 mg/dL — ABNORMAL HIGH (ref 6–20)
CO2: 25 mmol/L (ref 22–32)
Calcium: 8.5 mg/dL — ABNORMAL LOW (ref 8.9–10.3)
Chloride: 101 mmol/L (ref 98–111)
Creatinine, Ser: 0.81 mg/dL (ref 0.61–1.24)
GFR, Estimated: >60 mL/min (ref >60)
Glucose, Bld: 119 mg/dL — ABNORMAL HIGH (ref 70–99)
Potassium: 4.2 mmol/L (ref 3.5–5.1)
Sodium: 132 mmol/L — ABNORMAL LOW (ref 135–145)
Total Bilirubin: 0.3 mg/dL (ref 0.3–1.2)
Total Protein: 6.9 g/dL (ref 6.5–8.1)

## 2020-05-01 MED ORDER — HYDROMORPHONE HCL 1 MG/ML IJ SOLN
1.0000 mg | INTRAMUSCULAR | Status: DC | PRN
  Administered 2020-05-01 – 2020-05-04 (×17): 1 mg via INTRAVENOUS
  Filled 2020-05-01 (×18): qty 1

## 2020-05-01 MED ORDER — OXYCODONE HCL ER 10 MG PO T12A
10.0000 mg | EXTENDED_RELEASE_TABLET | Freq: Two times a day (BID) | ORAL | Status: DC
  Administered 2020-05-01 – 2020-05-04 (×7): 10 mg via ORAL
  Filled 2020-05-01 (×7): qty 1

## 2020-05-01 NOTE — Progress Notes (Signed)
BLE with eschar cleaned with normal saline and covered with xeroform and  Kerlix and secured with durapore tape.  Pt medicated for pain after procedure.  Pt tolerated fairly well.

## 2020-05-01 NOTE — Progress Notes (Signed)
Wound care performed for bilateral lower extremites, which were cleaned, covered in vasoline gauze and then abdominal pads, and wrapped in kerlix.  His sheet were also changed at this time.  His legs were additionally elevated.

## 2020-05-01 NOTE — Progress Notes (Signed)
Pt was brought to 1318 via bed from 4th floor, pt alert and oriented. Does complain of pain to his legs but understands pain med is not due again until 1945. Pt voices no other complaints. drsgs to bil lower ext changed by 4th floor nurse prior to transfer. drsgs clean dry and intact. Call bell wtihin reach.

## 2020-05-01 NOTE — Progress Notes (Signed)
PROGRESS NOTE    Alex [name]  NAT:[id number] DOB: Aug 24, 1985 DOA: 04/28/2020 PCP: Patient, No Pcp Per   Brief Narrative:  Alex [name] is a 35 y.o. 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  Denied any fever, chills, any other complaint.  Patient with known tricuspid valve MRSA endocarditis with multiple recent admissions.  Most recently admitted 02/07/2020-02/12/2020 at which time he was found to have pulmonary septic emboli.  He was initially treated with IV vancomycin however developed a suspected drug rash and antibiotic with switched to daptomycin.  He received a dose of IV oritavancin on 02/11/2020 and was discharged on a 6-week course of oral doxycycline (patient did not fill until 04/16/2020).  During that hospitalization blood cultures remained negative. TTE 02/07/2020 again showed tricuspid valve endocarditis with severe TR. CT surgery recommended conservative nonsurgical management.   He reports snorting opiates several days ago.  He denies any injection drug use over the last couple months.  He denies any recent cocaine or alcohol use.  He smokes half pack of cigarettes per day.  He denies using any recreational benzodiazepines.  ED Course:  Initial vitals stable, Urinalysis negative for UTI.  COVID-19 negative. UDS positive for THC. Portable chest x-ray personally reviewed and shows significant improvement of previous bilateral pulmonary infiltrates.  Patient was given IV Lasix 20 mg once and OxyIR 5 mg.  EDP discussed with on-call infectious disease who recommended medical admission and will plan to consult tomorrow.  Okay to hold  antibiotics as patient not septic appearing.  EDP also discussed with on-call CT surgery who recommended repeat echocardiogram and vascular surgery who felt there was no vascular surgery needed at this time.    He was admitted to hospital service.  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  Recommended punch biopsy.  I have consulted surgery for that.  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  Blood cultures were negative at that time.  CTA showed pulmonary septic emboli.  Was initially on IV vancomycin which was discontinued and switched to daptomycin due to questionable drug rash.  Received 1 dose IV oritavancin and was discharged with planned 6-week course of oral doxycycline which he did not fill until 04/16/2020.  -Follow blood cultures which are negative today. -Holding antibiotics at this time per ID recommendation.  Received 20 mg of IV Lasix in the ED. repeat echo shows tricuspid wall vegetation which is stable.  Leukoclassic vasculitis bilateral lower extremities: Could be secondary to combination of subacute/chronic endocarditis, hepatitis C or drug associated such as levamisole vasculitis or simply worsened related to aggressive scratching.  Patient has been started on prednisone 30 mg p.o. daily by ID.  I have consulted general surgery for punch biopsy and patient will have that done tomorrow according to the surgery.  Patient still complains of pain despite of receiving oxycodone 10 mg every 4 hours.  I meant to switch him to long-acting oxycodone 10 mg twice daily and IV  Dilaudid 1 mg every 4 hours as needed for breakthrough pain.  Polysubstance use disorder: Reports  snorting opiates but no recent injection drug use.  UDS is positive for THC and amphetamines.  Denies recent alcohol or use of recreational benzodiazepines.  Smoking half pack cigarettes per day, nicotine patch provided.  Chronic hepatitis C: Untreated.  Follow-up with infectious disease.  DVT prophylaxis: enoxaparin (LOVENOX) injection 40 mg Start: 04/28/20 2200   Code Status: Full Code  Family Communication:  None present at bedside.  Plan of care discussed with patient in length and he verbalized understanding and agreed with it.  Status is: Inpatient  Remains inpatient appropriate because:Inpatient level of care appropriate due to severity of illness   Dispo: The patient is from: Home              Anticipated d/c is to: Home              Anticipated d/c date is: 2 days              Patient currently is not medically stable to d/c.   Difficult to place patient No    Estimated body mass index is 18.87 kg/m as calculated from the following:   Height as of this encounter: 6' (1.829 m).   Weight as of this encounter: 63.1 kg.      Nutritional status:               Consultants:   ID  Procedures:   None  Antimicrobials:  Anti-infectives (From admission, onward)   None         Subjective: Seen and examined.  Still complains of bilateral lower extremity pain and severe tenderness.  No other complaint.  Objective: Vitals:   04/30/20 1320 04/30/20 1947 05/01/20 0500 05/01/20 0610  BP: (!) 143/99 (!) 136/91  (!) 126/91  Pulse: 82 90  84  Resp: 12 20  16   Temp: 98.1 F (36.7 C) 98.5 F (36.9 C)  97.9 F (36.6 C)  TempSrc: Oral Oral  Oral  SpO2: 100% 100%    Weight:   63.1 kg   Height:        Intake/Output Summary (Last 24 hours) at 05/01/2020 1053 Last data filed at 05/01/2020 0610 Gross per 24 hour  Intake 1620 ml  Output 2600 ml  Net -980 ml   Filed Weights   04/28/20 2206 04/30/20 0500 05/01/20 0500  Weight: 59.2 kg 65.1 kg 63.1 kg     Examination:  General exam: Appears calm and comfortable  Respiratory system: Clear to auscultation. Respiratory effort normal. Cardiovascular system: S1 & S2 heard, RRR. No JVD, murmurs, rubs, gallops or clicks. No pedal edema. Gastrointestinal system: Abdomen is nondistended, soft and nontender. No organomegaly or masses felt. Normal bowel sounds heard. Central nervous system: Alert and oriented. No focal neurological deficits. Extremities: Symmetric 5 x 5 power. Skin: Palpable purpura and rash in bilateral lower extremity. Psychiatry: Judgement and insight appear poor  Data Reviewed: I have personally reviewed following labs and imaging studies  CBC: Recent Labs  Lab 04/28/20 1600 04/29/20 0407 05/01/20 0442  WBC 6.6 6.4 10.2  NEUTROABS 4.7  --  6.5  HGB 11.1* 11.7* 11.6*  HCT 36.3* 36.9* 36.8*  MCV 90.3 87.4 87.2  PLT 224 221 245   Basic Metabolic Panel: Recent Labs  Lab 04/28/20 1600 04/29/20 0407 05/01/20 0442  NA 138 139 132*  K 4.3 4.2 4.2  CL 101 102 101  CO2 26 29 25  GLUCOSE 102* 116* 119*  BUN 24* 25* 24*  CREATININE 0.94 0.97 0.81  CALCIUM 9.1 8.9 8.5*   GFR: Estimated Creatinine Clearance: 114.7 mL/min (by C-G formula based on SCr of 0.81 mg/dL). Liver Function Tests: Recent Labs  Lab 04/28/20 1600 04/29/20 0407 05/01/20 0442  AST 52* 46* 36  ALT 37 35 34  ALKPHOS 158* 179* 162*  BILITOT 0.9 0.7 0.3  PROT 7.4 7.1 6.9  ALBUMIN 3.2* 2.9* 2.7*   No results for input(s): LIPASE, AMYLASE in the last 168 hours. No results for input(s): AMMONIA in the last 168 hours. Coagulation Profile: Recent Labs  Lab 04/28/20 1600  INR 1.2   Cardiac Enzymes: No results for input(s): CKTOTAL, CKMB, CKMBINDEX, TROPONINI in the last 168 hours. BNP (last 3 results) No results for input(s): PROBNP in the last 8760 hours. HbA1C: No results for input(s): HGBA1C in the last 72 hours. CBG: No results for input(s): GLUCAP in the last 168 hours. Lipid  Profile: No results for input(s): CHOL, HDL, LDLCALC, TRIG, CHOLHDL, LDLDIRECT in the last 72 hours. Thyroid Function Tests: No results for input(s): TSH, T4TOTAL, FREET4, T3FREE, THYROIDAB in the last 72 hours. Anemia Panel: No results for input(s): VITAMINB12, FOLATE, FERRITIN, TIBC, IRON, RETICCTPCT in the last 72 hours. Sepsis Labs: Recent Labs  Lab 04/28/20 1600  LATICACIDVEN 2.3*    Recent Results (from the past 240 hour(s))  Blood culture (routine x 2)     Status: None (Preliminary result)   Collection Time: 04/28/20  4:00 PM   Specimen: BLOOD RIGHT FOREARM  Result Value Ref Range Status   Specimen Description   Final    BLOOD RIGHT FOREARM Performed at [facility], [address], [place], [place] [address]    Special Requests   Final    BOTTLES DRAWN AEROBIC AND ANAEROBIC Blood Culture results may not be optimal due to an inadequate volume of blood received in culture bottles Performed at [facility], [address], [place], [place] [address]    Culture   Final    NO GROWTH 2 DAYS Performed at [facility], [address] [place], [place] [address]    Report Status PENDING  Incomplete  Resp Panel by RT-PCR (Flu A&B, Covid) Nasopharyngeal Swab     Status: None   Collection Time: 04/28/20  6:56 PM   Specimen: Nasopharyngeal Swab; Nasopharyngeal(NP) swabs in vial transport medium  Result Value Ref Range Status   SARS Coronavirus 2 by RT PCR NEGATIVE NEGATIVE Final    Comment: (NOTE) SARS-CoV-2 target nucleic acids are NOT DETECTED.  The SARS-CoV-2 RNA is generally detectable in upper respiratory specimens during the acute phase of infection. The lowest concentration of SARS-CoV-2 viral copies this assay can detect is 138 copies/mL. A negative result does not preclude SARS-Cov-2 infection and should not be used as the sole basis for treatment or other patient management decisions. A negative result may occur with   improper specimen collection/handling, submission of specimen other than nasopharyngeal swab, presence of viral mutation(s) within the areas targeted by this assay, and inadequate number of viral copies(<138 copies/mL). A negative result must be combined with clinical observations, patient history, and epidemiological information. The expected result is Negative.  Fact Sheet for Patients:  BloggerCourse.com  Fact Sheet for Healthcare Providers:  SeriousBroker.it  This test is no t yet approved or cleared by the [place] FDA and  has been authorized for detection and/or diagnosis of SARS-CoV-2 by FDA under an  Emergency Use Authorization (EUA). This EUA will remain  in effect (meaning this test can be used) for the duration of the COVID-19 declaration under Section 564(b)(1) of the Act, 21 U.S.C.section 360bbb-3(b)(1), unless the authorization is terminated  or revoked sooner.       Influenza A by PCR NEGATIVE NEGATIVE Final   Influenza B by PCR NEGATIVE NEGATIVE Final    Comment: (NOTE) The Xpert Xpress SARS-CoV-2/FLU/RSV plus assay is intended as an aid in the diagnosis of influenza from Nasopharyngeal swab specimens and should not be used as a sole basis for treatment. Nasal washings and aspirates are unacceptable for Xpert Xpress SARS-CoV-2/FLU/RSV testing.  Fact Sheet for Patients: BloggerCourse.comhttps://www.fda.gov/media/152166/download  Fact Sheet for Healthcare Providers: SeriousBroker.ithttps://www.fda.gov/media/152162/download  This test is not yet approved or cleared by the [place] FDA and has been authorized for detection and/or diagnosis of SARS-CoV-2 by FDA under an Emergency Use Authorization (EUA). This EUA will remain in effect (meaning this test can be used) for the duration of the COVID-19 declaration under Section 564(b)(1) of the Act, 21 U.S.C. section 360bbb-3(b)(1), unless the authorization is terminated  or revoked.  Performed at [facility], [address] [place], [place] [address]   Blood culture (routine x 2)     Status: None (Preliminary result)   Collection Time: 04/28/20 10:49 PM   Specimen: BLOOD LEFT HAND  Result Value Ref Range Status   Specimen Description   Final    BLOOD LEFT HAND Performed at [facility], [address] [place], [place] [address]    Special Requests   Final    BOTTLES DRAWN AEROBIC ONLY Blood Culture results may not be optimal due to an inadequate volume of blood received in culture bottles Performed at [facility], [address] [place], [place] [address]    Culture   Final    NO GROWTH 2 DAYS Performed at [facility], [address] [place], [place] [address]    Report Status PENDING  Incomplete  MRSA PCR Screening     Status: Abnormal   Collection Time: 04/28/20 11:57 PM   Specimen: Nasal Mucosa; Nasopharyngeal  Result Value Ref Range Status   MRSA by PCR POSITIVE (A) NEGATIVE Final    Comment:        The GeneXpert MRSA Assay (FDA approved for NASAL specimens only), is one component of a comprehensive MRSA colonization surveillance program. It is not intended to diagnose MRSA infection nor to guide or monitor treatment for MRSA infections. RESULT CALLED TO, READ BACK BY AND VERIFIED WITH: HENSON C. 02.18.22 @ 0223 BY MECIAL J. Performed at [facility], [address] [place], [place] [address]       Radiology Studies: ECHOCARDIOGRAM COMPLETE  Result Date: 04/29/2020    ECHOCARDIOGRAM REPORT   Patient Name:   [name] M Celmer Date of Exam: 04/29/2020 Medical Rec #:  [id number]      Height:       72.0 in Accession #:    [id number]     Weight:       130.5 lb Date of Birth:  [birth year]      BSA:          1.777 m Patient Age:    34 years       BP:           134/101 mmHg Patient Gender: M              HR:  86 bpm. Exam Location:  Inpatient  Procedure: 2D Echo, Cardiac Doppler and Color Doppler Indications:    Endocarditis  History:        Patient has prior history of Echocardiogram examinations, most                 recent 02/07/2020. Endocarditis and TV endocarditis,                 Signs/Symptoms:Bacteremia; Risk Factors:Current Smoker. MRSA,                 polysubstance abuse, Pul.septic embolic, Hep. C.  Sonographer:    Lavenia Atlas Referring Phys: [id number] VISHAL R PATEL IMPRESSIONS  1. Left ventricular ejection fraction, by estimation, is 60 to 65%. The left ventricle has normal function. The left ventricle has no regional wall motion abnormalities. Left ventricular diastolic parameters are indeterminate.  2. Right ventricular systolic function is normal. The right ventricular size is severely enlarged.  3. Right atrial size was severely dilated.  4. The mitral valve is normal in structure. No evidence of mitral valve regurgitation. No evidence of mitral stenosis.  5. Tricuspid valve vegetation, mobile mass (1.1 x 0.6 cm). The tricuspid valve is abnormal. Tricuspid valve regurgitation is torrential.  6. The aortic valve is normal in structure. Aortic valve regurgitation is not visualized. No aortic stenosis is present.  7. The inferior vena cava is dilated in size with <50% respiratory variability, suggesting right atrial pressure of 15 mmHg. FINDINGS  Left Ventricle: Left ventricular ejection fraction, by estimation, is 60 to 65%. The left ventricle has normal function. The left ventricle has no regional wall motion abnormalities. The left ventricular internal cavity size was normal in size. There is  no left ventricular hypertrophy. Left ventricular diastolic parameters are indeterminate. Right Ventricle: The right ventricular size is severely enlarged. No increase in right ventricular wall thickness. Right ventricular systolic function is normal. Left Atrium: Left atrial size was normal in size. Right Atrium: Right atrial size was  severely dilated. Pericardium: There is no evidence of pericardial effusion. Mitral Valve: The mitral valve is normal in structure. No evidence of mitral valve regurgitation. No evidence of mitral valve stenosis. Tricuspid Valve: Tricuspid valve vegetation, mobile mass (1.1 x 0.6 cm). The tricuspid valve is abnormal. Tricuspid valve regurgitation is torrential. No evidence of tricuspid stenosis. Aortic Valve: The aortic valve is normal in structure. Aortic valve regurgitation is not visualized. No aortic stenosis is present. Pulmonic Valve: The pulmonic valve was normal in structure. Pulmonic valve regurgitation is not visualized. No evidence of pulmonic stenosis. Aorta: The aortic root is normal in size and structure. Venous: The inferior vena cava is dilated in size with less than 50% respiratory variability, suggesting right atrial pressure of 15 mmHg. IAS/Shunts: No atrial level shunt detected by color flow Doppler. Donato Schultz MD Electronically signed by Donato Schultz MD Signature Date/Time: 04/29/2020/3:21:12 PM    Final     Scheduled Meds: . Chlorhexidine Gluconate Cloth  6 each Topical Q0600  . enoxaparin (LOVENOX) injection  40 mg Subcutaneous Q24H  . melatonin  3 mg Oral QHS  . mupirocin ointment  1 application Nasal BID  . nicotine  21 mg Transdermal Daily  . predniSONE  30 mg Oral Q breakfast  . sodium chloride flush  3 mL Intravenous Q12H   Continuous Infusions:   LOS: 1 day   Time spent: 31-minute   Hughie Closs, MD [facility]  05/01/2020, 10:53 AM   To contact the attending  provider between 7A-7P or the covering provider during after hours 7P-7A, please log into the web site [email or web address].

## 2020-05-01 NOTE — Progress Notes (Signed)
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 Leukocytoclastic vasculitis vs others Chronic Hepatitis C  Recommendations Continue prednisone as is Fu blood cultures  Plan for punch biopsy tomorrow with SX noted  Plan to treat Hep C as Outpatient Pain management per Primary   Dr Earlene Plater is back tomorrow   Rest of the management as per the primary team. Thank you for the consult. Please page with pertinent questions or concerns.  ______________________________________________________________________ Subjective patient seen and examined at the bedside. Lying in bed. Says his rash in the legs seems to have gotten much better than 3 days ago when he was admitted. He is sad as his sister died from endocarditis this morning.   Denies any other complaints. He says last IVDU was in 11/2019 and one week ago he used opiates    Vitals BP (!) 126/91 (BP Location: Right Arm)   Pulse 84   Temp 97.9 F (36.6 C) (Oral)   Resp 16   Ht 6' (1.829 m)   Wt 63.1 kg   SpO2 100%   BMI 18.87 kg/m    Appears comfortable, not in acute distress\  Chest - clear  CVS- Normal s1s2, RRR , murmur+   Abdomen - soft, NT   Extremities - scabs in bilateral hands related to picking  , LE with bilateral swelling and rashes      Pertinent Microbiology Results for orders placed or performed during the hospital encounter of 04/28/20  Blood culture (routine x 2)     Status: None (Preliminary result)   Collection Time:  04/28/20  4:00 PM   Specimen: BLOOD RIGHT FOREARM  Result Value Ref Range Status   Specimen Description   Final    BLOOD RIGHT FOREARM Performed at [facility], [address] [place], [place] [address]    Special Requests   Final    BOTTLES DRAWN AEROBIC AND ANAEROBIC Blood Culture results may not be optimal due to an inadequate volume of blood received in culture bottles Performed at [facility], [address] [place], [place] [address]    Culture   Final    NO GROWTH 2 DAYS Performed at [facility], [address] [place], [place] [address]    Report Status PENDING  Incomplete  Resp Panel by RT-PCR (Flu A&B, Covid) Nasopharyngeal Swab     Status: None   Collection Time: 04/28/20  6:56 PM   Specimen: Nasopharyngeal Swab; Nasopharyngeal(NP) swabs in vial transport medium  Result Value Ref Range Status   SARS Coronavirus 2 by RT PCR NEGATIVE NEGATIVE Final    Comment: (NOTE) SARS-CoV-2 target nucleic acids are NOT DETECTED.  The SARS-CoV-2 RNA is generally detectable in upper respiratory specimens during the acute phase of infection. The lowest concentration of SARS-CoV-2 viral copies this assay can detect is 138 copies/mL. A negative result does not preclude SARS-Cov-2 infection and should not be used as the sole basis for treatment or other patient management decisions. A negative result may occur with  improper specimen collection/handling, submission of specimen  other than nasopharyngeal swab, presence of viral mutation(s) within the areas targeted by this assay, and inadequate number of viral copies(<138 copies/mL). A negative result must be combined with clinical observations, patient history, and epidemiological information. The expected result is Negative.  Fact Sheet for Patients:  BloggerCourse.com  Fact Sheet for Healthcare Providers:  SeriousBroker.it  This test  is no t yet approved or cleared by the [place] FDA and  has been authorized for detection and/or diagnosis of SARS-CoV-2 by FDA under an Emergency Use Authorization (EUA). This EUA will remain  in effect (meaning this test can be used) for the duration of the COVID-19 declaration under Section 564(b)(1) of the Act, 21 U.S.C.section 360bbb-3(b)(1), unless the authorization is terminated  or revoked sooner.       Influenza A by PCR NEGATIVE NEGATIVE Final   Influenza B by PCR NEGATIVE NEGATIVE Final    Comment: (NOTE) The Xpert Xpress SARS-CoV-2/FLU/RSV plus assay is intended as an aid in the diagnosis of influenza from Nasopharyngeal swab specimens and should not be used as a sole basis for treatment. Nasal washings and aspirates are unacceptable for Xpert Xpress SARS-CoV-2/FLU/RSV testing.  Fact Sheet for Patients: BloggerCourse.com  Fact Sheet for Healthcare Providers: SeriousBroker.it  This test is not yet approved or cleared by the [place] FDA and has been authorized for detection and/or diagnosis of SARS-CoV-2 by FDA under an Emergency Use Authorization (EUA). This EUA will remain in effect (meaning this test can be used) for the duration of the COVID-19 declaration under Section 564(b)(1) of the Act, 21 U.S.C. section 360bbb-3(b)(1), unless the authorization is terminated or revoked.  Performed at [facility], [address], [place], [place] [address]   Blood culture (routine x 2)     Status: None (Preliminary result)   Collection Time: 04/28/20 10:49 PM   Specimen: BLOOD LEFT HAND  Result Value Ref Range Status   Specimen Description   Final    BLOOD LEFT HAND Performed at [facility], [address], [place], [place] [address]    Special Requests   Final    BOTTLES DRAWN AEROBIC ONLY Blood Culture results may not be optimal due to an inadequate volume of blood  received in culture bottles Performed at [facility], [address], [place], [place] [address]    Culture   Final    NO GROWTH 2 DAYS Performed at [facility], [address], [place], [place] [address]    Report Status PENDING  Incomplete  MRSA PCR Screening     Status: Abnormal   Collection Time: 04/28/20 11:57 PM   Specimen: Nasal Mucosa; Nasopharyngeal  Result Value Ref Range Status   MRSA by PCR POSITIVE (A) NEGATIVE Final    Comment:        The GeneXpert MRSA Assay (FDA approved for NASAL specimens only), is one component of a comprehensive MRSA colonization surveillance program. It is not intended to diagnose MRSA infection nor to guide or monitor treatment for MRSA infections. RESULT CALLED TO, READ BACK BY AND VERIFIED WITH: HENSON C. 02.18.22 @ 0223 BY MECIAL J. Performed at [facility], [address] [place], [place] [address]      Pertinent Lab. CBC Latest Ref Rng & Units 05/01/2020 04/29/2020 04/28/2020  WBC 4.0 - 10.5 K/uL 10.2 6.4 6.6  Hemoglobin 13.0 - 17.0 g/dL 11.6(L) 11.7(L) 11.1(L)  Hematocrit 39.0 - 52.0 % 36.8(L) 36.9(L) 36.3(L)  Platelets 150 - 400 K/uL  245 221 224   CMP Latest Ref Rng & Units 05/01/2020 04/29/2020 04/28/2020  Glucose 70 - 99 mg/dL 540(G) 867(Y) 195(K)  BUN 6 - 20 mg/dL 93(O) 67(T) 24(P)  Creatinine 0.61 - 1.24 mg/dL 8.09 9.83 3.82  Sodium 135 - 145 mmol/L 132(L) 139 138  Potassium 3.5 - 5.1 mmol/L 4.2 4.2 4.3  Chloride 98 - 111 mmol/L 101 102 101  CO2 22 - 32 mmol/L 25 29 26   Calcium 8.9 - 10.3 mg/dL ) 8.9 9.1  Total Protein 6.5 - 8.1 g/dL 6.9 7.1 7.4  Total Bilirubin 0.3 - 1.2 mg/dL 0.3 0.7 0.9  Alkaline Phos 38 - 126 U/L 162(H) 179(H) 158(H)  AST 15 - 41 U/L 36 46(H) 52(H)  ALT 0 - 44 U/L 34 35 37     Pertinent Imaging today Plain films and CT images have been personally visualized and interpreted; radiology reports have been reviewed. Decision making incorporated into  the Impression / Recommendations.  I have spent approx 30 minutes for this patient encounter including review of prior medical records with greater than 50% of time being face to face and coordination of their care.  Electronically signed by:   5.0(N, MD Infectious Disease Physician [facility] Pager: [phone]

## 2020-05-01 NOTE — Consult Note (Signed)
Reason for Consult: Lower extremity skin lesions-Punch biopsy Referring Physician: Jacqulyn Bath [name]   Alex [name] is an 35 y.o. male.  HPI: Asked see patient at request of medical service for bilateral lower extremity rash/wounds/eschar.  History of IV drug use.  Admitted with lower extremity rash and eschar.  Punch biopsy requested for further diagnosis.  Patient is a poor historian.  Unclear of how long he has had the rash but does have some leg swelling for last couple weeks he states.  Past Medical History:  Diagnosis Date  . ADHD (attention deficit hyperactivity disorder)   . Allergy   . Asthma   . CHF (congestive heart failure) (HCC)   . Depressed   . 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 ETOH abuse   . History of suicidal tendencies   . Polysubstance abuse [facility])     Past Surgical History:  Procedure Laterality Date  . RADIOLOGY WITH ANESTHESIA N/A 12/18/2019   Procedure: MRI-THORACIC MRI WITH AND WITHOUT CONSTRAST;  Surgeon: Radiologist, Medication, [name];  Location: [facility] OR;  Service: Radiology;  Laterality: N/A;  . RADIOLOGY WITH ANESTHESIA N/A 01/14/2020   Procedure: MRI WITH ANESTHESIA OF THORACIC SPINE AND LUMBAR SPINE;  Surgeon: Radiologist, Medication, [name];  Location: [facility] OR;  Service: Radiology;  Laterality: N/A;  . RADIOLOGY WITH ANESTHESIA N/A 02/10/2020   Procedure: MRI WITH ANESTHESIA OF THORACIC SPINE WITH AND WIHTOUT AND LUMBAR WITH AND WITHOUT CONTRAST;  Surgeon: Radiologist, Medication, [name];  Location: [facility] OR;  Service: Radiology;  Laterality: N/A;  . TEE WITHOUT CARDIOVERSION N/A 12/31/2018   Procedure: TRANSESOPHAGEAL ECHOCARDIOGRAM (TEE);  Surgeon: Alex Fair, [name];  Location: [facility] ENDOSCOPY;  Service: Cardiovascular;  Laterality: N/A;    Family History  Problem Relation Age of Onset  . COPD Other     Social History:  reports that he has been smoking cigarettes. He has a 10.00 pack-year smoking history. He has never used smokeless tobacco. He reports current  alcohol use of about 12.0 standard drinks of alcohol per week. He reports current drug use. Frequency: 6.00 times per week. Drugs: Marijuana, Cocaine, Heroin, Fentanyl, and IV.  Allergies:  Allergies  Allergen Reactions  . Vancomycin Rash    Patient on vancomycin for 3 weeks, developed rash on his lower extremities. Did not resemble red man syndrome. Required change of abx    Medications: I have reviewed the patient's current medications.  Results for orders placed or performed during the hospital encounter of 04/28/20 (from the past 48 hour(s))  Mpo/pr-3 (anca) antibodies     Status: None   Collection Time: 04/29/20  5:08 PM  Result Value Ref Range   Myeloperoxidase Abs <9.0 0.0 - 9.0 U/mL   ANCA Proteinase 3 <3.5 0.0 - 3.5 U/mL    Comment: (NOTE) Performed At: [facility] [address] [place], [place] [id number] Alex [name] [id number]   ANA w/Reflex if Positive     Status: None   Collection Time: 04/29/20  5:08 PM  Result Value Ref Range   Anti Nuclear Antibody (ANA) Negative Negative    Comment: (NOTE) Performed At: [facility] [address] [place], [place] [id number] Alex [name] [id number]   CBC with Differential/Platelet     Status: Abnormal   Collection Time: 05/01/20  4:42 AM  Result Value Ref Range   WBC 10.2 4.0 - 10.5 K/uL   RBC 4.22 4.22 - 5.81 MIL/uL   Hemoglobin 11.6 (L) 13.0 - 17.0 g/dL   HCT 43.3 (L) 29.5 - 18.8 %  MCV 87.2 80.0 - 100.0 fL   MCH 27.5 26.0 - 34.0 pg   MCHC 31.5 30.0 - 36.0 g/dL   RDW 41.5 (H) 83.0 - 94.0 %   Platelets 245 150 - 400 K/uL   nRBC 0.0 0.0 - 0.2 %   Neutrophils Relative % 64 %   Neutro Abs 6.5 1.7 - 7.7 K/uL   Lymphocytes Relative 28 %   Lymphs Abs 2.8 0.7 - 4.0 K/uL   Monocytes Relative 8 %   Monocytes Absolute 0.8 0.1 - 1.0 K/uL   Eosinophils Relative 0 %   Eosinophils Absolute 0.0 0.0 - 0.5 K/uL   Basophils Relative 0 %   Basophils Absolute 0.0 0.0 - 0.1 K/uL   Immature  Granulocytes 0 %   Abs Immature Granulocytes 0.02 0.00 - 0.07 K/uL    Comment: Performed at [facility], [address], [place], [place] [address]  Comprehensive metabolic panel     Status: Abnormal   Collection Time: 05/01/20  4:42 AM  Result Value Ref Range   Sodium 132 (L) 135 - 145 mmol/L   Potassium 4.2 3.5 - 5.1 mmol/L   Chloride 101 98 - 111 mmol/L   CO2 25 22 - 32 mmol/L   Glucose, Bld 119 (H) 70 - 99 mg/dL    Comment: Glucose reference range applies only to samples taken after fasting for at least 8 hours.   BUN 24 (H) 6 - 20 mg/dL   Creatinine, Ser 8.11 0.61 - 1.24 mg/dL   Calcium 8.5 (L) 8.9 - 10.3 mg/dL   Total Protein 6.9 6.5 - 8.1 g/dL   Albumin 2.7 (L) 3.5 - 5.0 g/dL   AST 36 15 - 41 U/L   ALT 34 0 - 44 U/L   Alkaline Phosphatase 162 (H) 38 - 126 U/L   Total Bilirubin 0.3 0.3 - 1.2 mg/dL   GFR, Estimated >03 >15 mL/min    Comment: (NOTE) Calculated using the CKD-EPI Creatinine Equation (2021)    Anion gap 6 5 - 15    Comment: Performed at [facility], [address] [place], [place] [address]    ECHOCARDIOGRAM COMPLETE  Result Date: 04/29/2020    ECHOCARDIOGRAM REPORT   Patient Name:   Alex [name] Date of Exam: 04/29/2020 Medical Rec #:  [id number]      Height:       72.0 in Accession #:    [id number]     Weight:       130.5 lb Date of Birth:  1985/09/17      BSA:          1.777 m Patient Age:    34 years       BP:           134/101 mmHg Patient Gender: M              HR:           86 bpm. Exam Location:  Inpatient Procedure: 2D Echo, Cardiac Doppler and Color Doppler Indications:    Endocarditis  History:        Patient has prior history of Echocardiogram examinations, most                 recent 02/07/2020. Endocarditis and TV endocarditis,                 Signs/Symptoms:Bacteremia; Risk Factors:Current Smoker. MRSA,  polysubstance abuse, Pul.septic embolic, Hep. C.  Sonographer:    Alex [name] Referring  Phys: [id number] Alex [name] IMPRESSIONS  1. Left ventricular ejection fraction, by estimation, is 60 to 65%. The left ventricle has normal function. The left ventricle has no regional wall motion abnormalities. Left ventricular diastolic parameters are indeterminate.  2. Right ventricular systolic function is normal. The right ventricular size is severely enlarged.  3. Right atrial size was severely dilated.  4. The mitral valve is normal in structure. No evidence of mitral valve regurgitation. No evidence of mitral stenosis.  5. Tricuspid valve vegetation, mobile mass (1.1 x 0.6 cm). The tricuspid valve is abnormal. Tricuspid valve regurgitation is torrential.  6. The aortic valve is normal in structure. Aortic valve regurgitation is not visualized. No aortic stenosis is present.  7. The inferior vena cava is dilated in size with <50% respiratory variability, suggesting right atrial pressure of 15 mmHg. FINDINGS  Left Ventricle: Left ventricular ejection fraction, by estimation, is 60 to 65%. The left ventricle has normal function. The left ventricle has no regional wall motion abnormalities. The left ventricular internal cavity size was normal in size. There is  no left ventricular hypertrophy. Left ventricular diastolic parameters are indeterminate. Right Ventricle: The right ventricular size is severely enlarged. No increase in right ventricular wall thickness. Right ventricular systolic function is normal. Left Atrium: Left atrial size was normal in size. Right Atrium: Right atrial size was severely dilated. Pericardium: There is no evidence of pericardial effusion. Mitral Valve: The mitral valve is normal in structure. No evidence of mitral valve regurgitation. No evidence of mitral valve stenosis. Tricuspid Valve: Tricuspid valve vegetation, mobile mass (1.1 x 0.6 cm). The tricuspid valve is abnormal. Tricuspid valve regurgitation is torrential. No evidence of tricuspid stenosis. Aortic Valve: The aortic  valve is normal in structure. Aortic valve regurgitation is not visualized. No aortic stenosis is present. Pulmonic Valve: The pulmonic valve was normal in structure. Pulmonic valve regurgitation is not visualized. No evidence of pulmonic stenosis. Aorta: The aortic root is normal in size and structure. Venous: The inferior vena cava is dilated in size with less than 50% respiratory variability, suggesting right atrial pressure of 15 mmHg. IAS/Shunts: No atrial level shunt detected by color flow Doppler. Donato Schultz [name] Electronically signed by Donato Schultz [name] Signature Date/Time: 04/29/2020/3:21:12 PM    Final     Review of Systems  Constitutional: Positive for fatigue. Negative for fever.  All other systems reviewed and are negative.  Blood pressure (!) 126/91, pulse 84, temperature 97.9 F (36.6 C), temperature source Oral, resp. rate 16, height 6' (1.829 m), weight 63.1 kg, SpO2 100 %. Physical Exam HENT:     Head: Normocephalic.  Eyes:     Conjunctiva/sclera: Conjunctivae normal.     Pupils: Pupils are equal, round, and reactive to light.  Cardiovascular:     Rate and Rhythm: Normal rate and regular rhythm.  Pulmonary:     Effort: Pulmonary effort is normal.     Breath sounds: Normal breath sounds.  Musculoskeletal:        General: Normal range of motion.  Skin:    Comments:   Bilateral anterior and medial LEs with dried serum AND  eschar, Right medial and anterior foot with eschar and blue discoloration INTO THE  subcutaneous tissues.  Erythematous rash presentation bilateral from feet to above knees, also arms. Most is dried serum or eschar   Neurological:     General: No focal deficit present.  Mental Status: He is alert.  Psychiatric:        Attention and Perception: He is inattentive.        Mood and Affect: Mood is depressed.        Speech: Speech normal.     Assessment/Plan: Patient Active Problem List   Diagnosis Date Noted  . Substance use disorder 04/28/2020  .  Nonspecific chest pain   . Septic embolism (HCC) 02/07/2020  . Multifocal pneumonia   . MRSA bacteremia   . Vasculitis (HCC)   . Malnutrition of moderate degree 12/25/2019  . Pleural effusion, bilateral   . Chronic hepatitis C without hepatic coma (HCC)   . Infective myositis   . Acute septic pulmonary embolism (HCC)   . Goals of care, counseling/discussion   . Palliative care encounter   . Endocarditis 12/14/2019  . Acute CHF (congestive heart failure) (HCC) 12/14/2019  . Anemia 12/14/2019  . Agitation 08/19/2019  . Endocarditis of tricuspid valve   . IVDU (intravenous drug user)   . Sepsis (HCC) 12/28/2018  . Alcohol use disorder, severe, dependence (HCC) 09/11/2018  . Substance induced mood disorder (HCC) 04/24/2016  . Polysubstance dependence (HCC) 04/23/2016  . Major depressive disorder, single episode, severe without psychotic features (HCC) 04/23/2016  . 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 Tobacco use disorder 02/01/2014  . ADHD (attention deficit hyperactivity disorder)   \\       Primary service has requested punch biopsy of lower extremity skin lesions.  This will be done tomorrow since there is no lab open today for processing of skin specimen.  This was discussed with the patient and physician of care.  Ragan A Phila Shoaf 05/01/2020, 11:41 AM

## 2020-05-01 NOTE — Plan of Care (Signed)
  Problem: Education: Goal: Knowledge of General Education information will improve Description: Including pain rating scale, medication(s)/side effects and non-pharmacologic comfort measures Outcome: Progressing   Problem: Clinical Measurements: Goal: Cardiovascular complication will be avoided Outcome: Progressing   Problem: Skin Integrity: Goal: Risk for impaired skin integrity will decrease Outcome: Progressing

## 2020-05-02 DIAGNOSIS — Z881 Allergy status to other antibiotic agents status: Secondary | ICD-10-CM

## 2020-05-02 DIAGNOSIS — Z8614 Personal history of Methicillin resistant Staphylococcus aureus infection: Secondary | ICD-10-CM

## 2020-05-02 DIAGNOSIS — M310 Hypersensitivity angiitis: Principal | ICD-10-CM

## 2020-05-02 DIAGNOSIS — B1920 Unspecified viral hepatitis C without hepatic coma: Secondary | ICD-10-CM

## 2020-05-02 LAB — COMPREHENSIVE METABOLIC PANEL
ALT: 38 U/L (ref 0–44)
AST: 35 U/L (ref 15–41)
Albumin: 2.9 g/dL — ABNORMAL LOW (ref 3.5–5.0)
Alkaline Phosphatase: 159 U/L — ABNORMAL HIGH (ref 38–126)
Anion gap: 8 (ref 5–15)
BUN: 25 mg/dL — ABNORMAL HIGH (ref 6–20)
CO2: 22 mmol/L (ref 22–32)
Calcium: 8.8 mg/dL — ABNORMAL LOW (ref 8.9–10.3)
Chloride: 104 mmol/L (ref 98–111)
Creatinine, Ser: 0.77 mg/dL (ref 0.61–1.24)
GFR, Estimated: >60 mL/min (ref >60)
Glucose, Bld: 76 mg/dL (ref 70–99)
Potassium: 4.2 mmol/L (ref 3.5–5.1)
Sodium: 134 mmol/L — ABNORMAL LOW (ref 135–145)
Total Bilirubin: 0.6 mg/dL (ref 0.3–1.2)
Total Protein: 7.2 g/dL (ref 6.5–8.1)

## 2020-05-02 LAB — ANCA TITERS
Atypical P-ANCA titer: <1:20 {titer}
C-ANCA: <1:20 {titer}
P-ANCA: <1:20 {titer}

## 2020-05-02 MED ORDER — KETOROLAC TROMETHAMINE 30 MG/ML IJ SOLN
15.0000 mg | Freq: Three times a day (TID) | INTRAMUSCULAR | Status: DC | PRN
  Administered 2020-05-02: 15 mg via INTRAVENOUS
  Filled 2020-05-02: qty 1

## 2020-05-02 MED ORDER — LIDOCAINE HCL (PF) 2 % IJ SOLN
0.0000 mL | Freq: Once | INTRAMUSCULAR | Status: DC | PRN
  Filled 2020-05-02: qty 20

## 2020-05-02 MED ORDER — LIDOCAINE HCL (PF) 1 % IJ SOLN
20.0000 mL | INTRAMUSCULAR | Status: DC | PRN
  Filled 2020-05-02: qty 20

## 2020-05-02 NOTE — Progress Notes (Signed)
Wound care done for patient. Wounds cleaned with normal saline, pat to dry, Xeroform applied along with ABD pads, kerlix, and paper tape.

## 2020-05-02 NOTE — Progress Notes (Signed)
    CC: skin rash  Subjective: ID asking for a skin punch biopsy even with improvement on steroids.  He is having significant pain and crying with dressings down.  Refusing to have it done today.  Asking why it wasn't done when he first came in and it was red.   Objective: Vital signs in last 24 hours: Temp:  [97.7 F (36.5 C)-98.2 F (36.8 C)] 98.2 F (36.8 C) (02/21 0710) Pulse Rate:  [72-86] 72 (02/21 0710) Resp:  [17-18] 18 (02/21 0710) BP: (122-145)/(80-92) 130/88 (02/21 0710) SpO2:  [98 %-100 %] 99 % (02/21 0710) Weight:  [59.9 kg] 59.9 kg (02/21 0500) Last BM Date: 05/01/20  Intake/Output from previous day: 02/20 0701 - 02/21 0700 In: 723 [P.O.:720; I.V.:3] Out: 3800 [Urine:3800] Intake/Output this shift: Total I/O In: 240 [P.O.:240] Out: -   General appearance: alert, cooperative and no distress Skin: Skin color, texture, turgor normal. No rashes or lesions or see pictures below        Lab Results:  Recent Labs    05/01/20 0442  WBC 10.2  HGB 11.6*  HCT 36.8*  PLT 245    BMET Recent Labs    05/01/20 0442 05/02/20 0436  NA 132* 134*  K 4.2 4.2  CL 101 104  CO2 25 22  GLUCOSE 119* 76  BUN 24* 25*  CREATININE 0.81 0.77  CALCIUM 8.5* 8.8*   PT/INR No results for input(s): LABPROT, INR in the last 72 hours.  Recent Labs  Lab 04/28/20 1600 04/29/20 0407 05/01/20 0442 05/02/20 0436  AST 52* 46* 36 35  ALT 37 35 34 38  ALKPHOS 158* 179* 162* 159*  BILITOT 0.9 0.7 0.3 0.6  PROT 7.4 7.1 6.9 7.2  ALBUMIN 3.2* 2.9* 2.7* 2.9*     Lipase  No results found for: LIPASE   Medications: . Chlorhexidine Gluconate Cloth  6 each Topical Q0600  . enoxaparin (LOVENOX) injection  40 mg Subcutaneous Q24H  . melatonin  3 mg Oral QHS  . mupirocin ointment  1 application Nasal BID  . nicotine  21 mg Transdermal Daily  . oxyCODONE  10 mg Oral Q12H  . predniSONE  30 mg Oral Q breakfast  . 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  Pt refusing to do it today, crying with pain asking for more pain medicine.  He has had Dilaudid and Oxycodone.  He reports being off narcotics, but fentanyl was his drug of choice.   I think we can take a biopsy above the eschared area and he wants it on the left side.  York Spaniel he will reconsider tomorrow.  Add Toradol for pain. Will aim to do biopsy tomorrow if he allows.    LOS: 2 days    Dannya Pitkin 05/02/2020 Please see Amion

## 2020-05-02 NOTE — Progress Notes (Signed)
°   ° °  [facility]  Date of Admission:  04/28/2020           Reason for visit: Follow up on vasculitis rash  Current antibiotics: None   ASSESSMENT:    1. 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 Substance use disorder 4. Hepatitis C 5. Listed vancomycin allergy: He tolerated this well during last admission and I do not think he has a true allergy to this  PLAN:     Appreciate surgery seeing for skin punch biopsy.  Patient declines to do today but will reconsider tomorrow  Continue prednisone 30 mg daily  Follow up vasculitis labs  Will follow.  Continue to monitor off antibiotics  SUBJECTIVE:   24 hour events:  No acute events  Patient reports significant pain with dressing changes.  Wants to know why biopsy was not done when his legs were more inflamed.  No fevers, chills, SOB, CP.  Rash improving.    OBJECTIVE:   Blood pressure 130/88, pulse 72, temperature 98.2 F (36.8 C), temperature source Oral, resp. rate 18, height 6' (1.829 m), weight 59.9 kg, SpO2 99 %. Body mass index is 17.9 kg/m.  Physical Exam Constitutional:      General: He is not in acute distress.    Appearance: Normal appearance.  Skin:    General: Skin is warm and dry.     Findings: Erythema (improving) and rash (improving) present.  Neurological:     General: No focal deficit present.     Mental Status: He is alert and oriented to person, place, and time.  Psychiatric:        Mood and Affect: Mood normal.        Behavior: Behavior normal.          Lab Results: Lab Results  Component Value Date   WBC 10.2 05/01/2020   HGB 11.6 (L) 05/01/2020   HCT 36.8 (L) 05/01/2020   MCV 87.2 05/01/2020   PLT 245 05/01/2020    Lab Results  Component Value Date   NA 134 (L) 05/02/2020   K 4.2 05/02/2020   CO2 22 05/02/2020   GLUCOSE 76 05/02/2020    BUN 25 (H) 05/02/2020   CREATININE 0.77 05/02/2020   CALCIUM 8.8 (L) 05/02/2020   GFRNONAA >60 05/02/2020   GFRAA >60 12/15/2019    Lab Results  Component Value Date   ALT 38 05/02/2020   AST 35 05/02/2020   ALKPHOS 159 (H) 05/02/2020   BILITOT 0.6 05/02/2020       Component Value Date/Time   CRP 6.6 (H) 04/28/2020 1600       Component Value Date/Time   ESRSEDRATE 36 (H) 04/28/2020 2249     I have reviewed the micro and lab results in Epic.  Imaging: No results found.   Imaging  independently reviewed in Epic.    Vedia Coffer for Infectious Disease [facility] [phone] pager 05/02/2020, 10:50 AM

## 2020-05-02 NOTE — Progress Notes (Signed)
PROGRESS NOTE    Alex [name]  YWV:[id number] DOB: 05/26/1985 DOA: 04/28/2020 PCP: Patient, No Pcp Per   Brief Narrative:  Alex [name] is a 35 y.o. 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  Denied any fever, chills, any other complaint.  Patient with known tricuspid valve MRSA endocarditis with multiple recent admissions.  Most recently admitted 02/07/2020-02/12/2020 at which time he was found to have pulmonary septic emboli.  He was initially treated with IV vancomycin however developed a suspected drug rash and antibiotic with switched to daptomycin.  He received a dose of IV oritavancin on 02/11/2020 and was discharged on a 6-week course of oral doxycycline (patient did not fill until 04/16/2020).  During that hospitalization blood cultures remained negative. TTE 02/07/2020 again showed tricuspid valve endocarditis with severe TR. CT surgery recommended conservative nonsurgical management.   He reports snorting opiates several days ago.  He denies any injection drug use over the last couple months.  He denies any recent cocaine or alcohol use.  He smokes half pack of cigarettes per day.  He denies using any recreational benzodiazepines.  ED Course:  Initial vitals stable, Urinalysis negative for UTI.  COVID-19 negative. UDS positive for THC. Portable chest x-ray personally reviewed and shows significant improvement of previous bilateral pulmonary infiltrates.  Patient was given IV Lasix 20 mg once and OxyIR 5 mg.  EDP discussed with on-call infectious disease who recommended medical admission and will plan to consult tomorrow.  Okay to hold  antibiotics as patient not septic appearing.  EDP also discussed with on-call CT surgery who recommended repeat echocardiogram and vascular surgery who felt there was no vascular surgery needed at this time.    He was admitted to hospital service.  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  Recommended punch biopsy.  I have consulted surgery for that.  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  Blood cultures were negative at that time.  CTA showed pulmonary septic emboli.  Was initially on IV vancomycin which was discontinued and switched to daptomycin due to questionable drug rash.  Received 1 dose IV oritavancin and was discharged with planned 6-week course of oral doxycycline which he did not fill until 04/16/2020.  -Follow blood cultures which are negative today. -Holding antibiotics at this time per ID recommendation.  Received 20 mg of IV Lasix in the ED. repeat echo shows tricuspid wall vegetation which is stable.  Leukoclassic vasculitis bilateral lower extremities: Could be secondary to combination of subacute/chronic endocarditis, hepatitis C or drug associated such as levamisole vasculitis or simply worsened related to aggressive scratching.  Patient has been started on prednisone 30 mg p.o. daily by ID.  Surgery was consulted yesterday for punch biopsy as ID had recommended.  However per surgery's note, patient did not consent to the biopsy today due to pain.  He questions why his biopsy was not done earlier when he came in.  ID had recommended this on Friday evening  and unfortunately, we did not have the facility to do biopsy over the weekend so it was being done first thing on  Monday but patient refused as mentioned above.  Polysubstance use disorder: Reports snorting opiates but no recent injection drug use.  UDS is positive for THC and amphetamines.  Denies recent alcohol or use of recreational benzodiazepines.  Smoking half pack cigarettes per day, nicotine patch provided.  Chronic hepatitis C: Untreated.  Follow-up with infectious disease.  DVT prophylaxis: enoxaparin (LOVENOX) injection 40 mg Start: 04/28/20 2200   Code Status: Full Code  Family Communication:  None present at bedside.  Plan of care discussed with patient in length and he verbalized understanding and agreed with it.  Status is: Inpatient  Remains inpatient appropriate because:Inpatient level of care appropriate due to severity of illness   Dispo: The patient is from: Home              Anticipated d/c is to: Home              Anticipated d/c date is: 2 days              Patient currently is not medically stable to d/c.   Difficult to place patient No    Estimated body mass index is 17.9 kg/m as calculated from the following:   Height as of this encounter: 6' (1.829 m).   Weight as of this encounter: 59.9 kg.      Nutritional status:               Consultants:   ID  Procedures:   None  Antimicrobials:  Anti-infectives (From admission, onward)   None         Subjective: Seen and examined.  He was comfortable and calm in the beginning.  Surgical PA was seeing this patient in front of me.  We removed his dressings.  He was complaining of pain and tenderness only with dressing removal.  I left the room in order to provide one-on-one time to surgical PA to perform the procedure however patient had declined the procedure afterwards.  Objective: Vitals:   05/01/20 1836 05/01/20 2033 05/02/20 0500 05/02/20 0710  BP: (!) 130/92 (!) 145/85  130/88  Pulse: 82 86  72  Resp: 17 18  18   Temp: 97.7 F (36.5 C) 97.8 F (36.6 C)  98.2 F (36.8 C)  TempSrc:  Oral Oral  Oral  SpO2: 98% 98%  99%  Weight:   59.9 kg   Height:        Intake/Output Summary (Last 24 hours) at 05/02/2020 1058 Last data filed at 05/02/2020 1000 Gross per 24 hour  Intake 1083 ml  Output 3300 ml  Net -2217 ml   Filed Weights   04/30/20 0500 05/01/20 0500 05/02/20 0500  Weight: 65.1 kg 63.1 kg 59.9 kg    Examination:  General exam: Appears calm and comfortable  Respiratory system: Clear to auscultation. Respiratory effort normal. Cardiovascular system: S1 & S2 heard, RRR. No JVD, murmurs, rubs, gallops or clicks. No pedal edema. Gastrointestinal system: Abdomen is nondistended, soft and nontender. No organomegaly or masses felt. Normal bowel sounds heard. Central nervous system: Alert and oriented. No focal neurological deficits. Extremities: Symmetric 5 x 5 power. Skin: Several areas of superficial necrosis bilateral lower extremities Psychiatry: Judgement and insight appear poor   Data Reviewed: I have personally reviewed following labs and imaging studies  CBC: Recent Labs  Lab 04/28/20 1600 04/29/20 0407 05/01/20 0442  WBC  6.6 6.4 10.2  NEUTROABS 4.7  --  6.5  HGB 11.1* 11.7* 11.6*  HCT 36.3* 36.9* 36.8*  MCV 90.3 87.4 87.2  PLT 224 221 245   Basic Metabolic Panel: Recent Labs  Lab 04/28/20 1600 04/29/20 0407 05/01/20 0442 05/02/20 0436  NA 138 139 132* 134*  K 4.3 4.2 4.2 4.2  CL 101 102 101 104  CO2 26 29 25 22   GLUCOSE 102* 116* 119* 76  BUN 24* 25* 24* 25*  CREATININE 0.94 0.97 0.81 0.77  CALCIUM 9.1 8.9 8.5* 8.8*   GFR: Estimated Creatinine Clearance: 110.2 mL/min (by C-G formula based on SCr of 0.77 mg/dL). Liver Function Tests: Recent Labs  Lab 04/28/20 1600 04/29/20 0407 05/01/20 0442 05/02/20 0436  AST 52* 46* 36 35  ALT 37 35 34 38  ALKPHOS 158* 179* 162* 159*  BILITOT 0.9 0.7 0.3 0.6  PROT 7.4 7.1 6.9 7.2  ALBUMIN 3.2* 2.9* 2.7* 2.9*   No results for input(s): LIPASE, AMYLASE in the last 168 hours. No results  for input(s): AMMONIA in the last 168 hours. Coagulation Profile: Recent Labs  Lab 04/28/20 1600  INR 1.2   Cardiac Enzymes: No results for input(s): CKTOTAL, CKMB, CKMBINDEX, TROPONINI in the last 168 hours. BNP (last 3 results) No results for input(s): PROBNP in the last 8760 hours. HbA1C: No results for input(s): HGBA1C in the last 72 hours. CBG: No results for input(s): GLUCAP in the last 168 hours. Lipid Profile: No results for input(s): CHOL, HDL, LDLCALC, TRIG, CHOLHDL, LDLDIRECT in the last 72 hours. Thyroid Function Tests: No results for input(s): TSH, T4TOTAL, FREET4, T3FREE, THYROIDAB in the last 72 hours. Anemia Panel: No results for input(s): VITAMINB12, FOLATE, FERRITIN, TIBC, IRON, RETICCTPCT in the last 72 hours. Sepsis Labs: Recent Labs  Lab 04/28/20 1600  LATICACIDVEN 2.3*    Recent Results (from the past 240 hour(s))  Blood culture (routine x 2)     Status: None (Preliminary result)   Collection Time: 04/28/20  4:00 PM   Specimen: BLOOD RIGHT FOREARM  Result Value Ref Range Status   Specimen Description   Final    BLOOD RIGHT FOREARM Performed at [facility], [address] [place], [place] [address]    Special Requests   Final    BOTTLES DRAWN AEROBIC AND ANAEROBIC Blood Culture results may not be optimal due to an inadequate volume of blood received in culture bottles Performed at [facility], [address] [place], [place] [address]    Culture   Final    NO GROWTH 3 DAYS Performed at [facility], [address] [place], [place] [address]    Report Status PENDING  Incomplete  Resp Panel by RT-PCR (Flu A&B, Covid) Nasopharyngeal Swab     Status: None   Collection Time: 04/28/20  6:56 PM   Specimen: Nasopharyngeal Swab; Nasopharyngeal(NP) swabs in vial transport medium  Result Value Ref Range Status   SARS Coronavirus 2 by RT PCR NEGATIVE NEGATIVE Final    Comment: (NOTE) SARS-CoV-2 target  nucleic acids are NOT DETECTED.  The SARS-CoV-2 RNA is generally detectable in upper respiratory specimens during the acute phase of infection. The lowest concentration of SARS-CoV-2 viral copies this assay can detect is 138 copies/mL. A negative result does not preclude SARS-Cov-2 infection and should not be used as the sole basis for treatment or other patient management decisions. A negative result may occur with  improper specimen collection/handling, submission of specimen other than  nasopharyngeal swab, presence of viral mutation(s) within the areas targeted by this assay, and inadequate number of viral copies(<138 copies/mL). A negative result must be combined with clinical observations, patient history, and epidemiological information. The expected result is Negative.  Fact Sheet for Patients:  BloggerCourse.com  Fact Sheet for Healthcare Providers:  SeriousBroker.it  This test is no t yet approved or cleared by the [place] FDA and  has been authorized for detection and/or diagnosis of SARS-CoV-2 by FDA under an Emergency Use Authorization (EUA). This EUA will remain  in effect (meaning this test can be used) for the duration of the COVID-19 declaration under Section 564(b)(1) of the Act, 21 U.S.C.section 360bbb-3(b)(1), unless the authorization is terminated  or revoked sooner.       Influenza A by PCR NEGATIVE NEGATIVE Final   Influenza B by PCR NEGATIVE NEGATIVE Final    Comment: (NOTE) The Xpert Xpress SARS-CoV-2/FLU/RSV plus assay is intended as an aid in the diagnosis of influenza from Nasopharyngeal swab specimens and should not be used as a sole basis for treatment. Nasal washings and aspirates are unacceptable for Xpert Xpress SARS-CoV-2/FLU/RSV testing.  Fact Sheet for Patients: BloggerCourse.com  Fact Sheet for Healthcare  Providers: SeriousBroker.it  This test is not yet approved or cleared by the [place] FDA and has been authorized for detection and/or diagnosis of SARS-CoV-2 by FDA under an Emergency Use Authorization (EUA). This EUA will remain in effect (meaning this test can be used) for the duration of the COVID-19 declaration under Section 564(b)(1) of the Act, 21 U.S.C. section 360bbb-3(b)(1), unless the authorization is terminated or revoked.  Performed at [facility] [place] [facility], [address] [place], [place] [address]   Blood culture (routine x 2)     Status: None (Preliminary result)   Collection Time: 04/28/20 10:49 PM   Specimen: BLOOD LEFT HAND  Result Value Ref Range Status   Specimen Description   Final    BLOOD LEFT HAND Performed at [facility], [address] [place], [place] [address]    Special Requests   Final    BOTTLES DRAWN AEROBIC ONLY Blood Culture results may not be optimal due to an inadequate volume of blood received in culture bottles Performed at [facility], [address], [place], [place] [address]    Culture   Final    NO GROWTH 3 DAYS Performed at [facility], [address], [place], [place] [address]    Report Status PENDING  Incomplete  MRSA PCR Screening     Status: Abnormal   Collection Time: 04/28/20 11:57 PM   Specimen: Nasal Mucosa; Nasopharyngeal  Result Value Ref Range Status   MRSA by PCR POSITIVE (A) NEGATIVE Final    Comment:        The GeneXpert MRSA Assay (FDA approved for NASAL specimens only), is one component of a comprehensive MRSA colonization surveillance program. It is not intended to diagnose MRSA infection nor to guide or monitor treatment for MRSA infections. RESULT CALLED TO, READ BACK BY AND VERIFIED WITH: HENSON C. 02.18.22 @ 0223 BY MECIAL J. Performed at [facility], [address] [place], [place] [address]        Radiology Studies: No results found.  Scheduled Meds: . Chlorhexidine Gluconate Cloth  6 each Topical Q0600  . enoxaparin (LOVENOX) injection  40 mg Subcutaneous Q24H  . melatonin  3 mg Oral QHS  . mupirocin ointment  1 application Nasal BID  .  nicotine  21 mg Transdermal Daily  . oxyCODONE  10 mg Oral Q12H  . predniSONE  30 mg Oral Q breakfast  . sodium chloride flush  3 mL Intravenous Q12H   Continuous Infusions:   LOS: 2 days   Time spent: 28 -minute   Hughie Closs, MD [facility]  05/02/2020, 10:58 AM   To contact the attending provider between 7A-7P or the covering provider during after hours 7P-7A, please log into the web site [email or web address].

## 2020-05-02 NOTE — Progress Notes (Signed)
Responding to a nurse referral, Chaplain visited w/ pt.  Chaplain established a ministry of presence with pt opening our time together by telling him his nurse had asked me to drop by b/c he was grieving for his sister who had died just a few short days ago.  Pt was very willing to talk about his sister who died from complications related to drug use.  Pt revealed he suffers from a drug abuse too and feels powerless:  First b/c he is an agnostic and second he fears he will lose his life to drug use too, it just being a matter of time before he dies.  Chaplain wondered aloud w/ pt if he could think of any way to regain some of his personal power.  He spoke of drug rehab.  Chaplain affirmed that as a positive next step, commended him for thinking of that and assuring him he is worthy of receiving help.  Pt expressed not knowing how to get there or what resources are available to him. Chaplain offered to make referral to SW who knew those answers and may be able to help him get into one upon discharge.  Pt expressed gratitude and hopefulness.  Chaplain spoke w/ pt's nurse who said the pt and she had talked about rehab not 10 minutes before Chaplain arrived.  Nurse will make referral to SW.    Chaplain will check back in on pt on Wednesday.  Vernell Morgans Chaplain

## 2020-05-03 LAB — CBC
HCT: 39.5 % (ref 39.0–52.0)
Hemoglobin: 12.5 g/dL — ABNORMAL LOW (ref 13.0–17.0)
MCH: 27.2 pg (ref 26.0–34.0)
MCHC: 31.6 g/dL (ref 30.0–36.0)
MCV: 86.1 fL (ref 80.0–100.0)
Platelets: 348 10*3/uL (ref 150–400)
RBC: 4.59 MIL/uL (ref 4.22–5.81)
RDW: 16.3 % — ABNORMAL HIGH (ref 11.5–15.5)
WBC: 12.1 10*3/uL — ABNORMAL HIGH (ref 4.0–10.5)
nRBC: 0 % (ref 0.0–0.2)

## 2020-05-03 LAB — BASIC METABOLIC PANEL
Anion gap: 10 (ref 5–15)
BUN: 27 mg/dL — ABNORMAL HIGH (ref 6–20)
CO2: 21 mmol/L — ABNORMAL LOW (ref 22–32)
Calcium: 8.7 mg/dL — ABNORMAL LOW (ref 8.9–10.3)
Chloride: 102 mmol/L (ref 98–111)
Creatinine, Ser: 0.76 mg/dL (ref 0.61–1.24)
GFR, Estimated: >60 mL/min (ref >60)
Glucose, Bld: 112 mg/dL — ABNORMAL HIGH (ref 70–99)
Potassium: 4.2 mmol/L (ref 3.5–5.1)
Sodium: 133 mmol/L — ABNORMAL LOW (ref 135–145)

## 2020-05-03 LAB — ANCA TITERS
Atypical P-ANCA titer: <1:20 {titer}
C-ANCA: <1:20 {titer}
P-ANCA: <1:20 {titer}

## 2020-05-03 LAB — CRYOGLOBULIN

## 2020-05-03 NOTE — Progress Notes (Signed)
Continues to refuse punch biopsy. We will sign off.  Franne Forts, [facility] 05/03/2020, 8:38 AM Please see Amion for pager number during day hours 7:00am-4:30pm

## 2020-05-03 NOTE — Procedures (Signed)
Skin Biopsy Procedure Note   Procedure: Punch biopsy   Pre-operative Diagnosis: Rash   Post-procedure Diagnosis: same   Indications: rash of unknown etiology   Anesthesia: lidocaine 1%   Procedure Details  The procedure, risks and complications have been discussed in detail (including, but not limited to pain, infection, bleeding) with the patient, and the patient wishes to proceed with the procedure. The skin was sterilely prepped over the affected area in the usual fashion. Using a 45mm punch a skin biopsy was taken from rash on left lower extremity. Specimen placed in formalin specimen cup. Pressure held until hemostasis reached. Dry dressing applied. The patient was observed until stable. There were no complications, and the patient tolerated the procedure well.  Plan:  - Biopsy performed and sent for pathology. Leave dressing in place x24 hours unless it becomes saturated. Ok to English as a second language teacher.  We will sign off, call with concerns.     Franne Forts, PA-C [facility] 05/03/2020, 4:04 PM Please see Amion for pager number during day hours 7:00am-4:30pm

## 2020-05-03 NOTE — Progress Notes (Signed)
PROGRESS NOTE    Alex [name]  QIW:[id number] DOB: 1985/09/18 DOA: 04/28/2020 PCP: Patient, No Pcp Per   Brief Narrative:  Alex [name] is a 35 y.o. 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  Denied any fever, chills, any other complaint.  Patient with known tricuspid valve MRSA endocarditis with multiple recent admissions.  Most recently admitted 02/07/2020-02/12/2020 at which time he was found to have pulmonary septic emboli.  He was initially treated with IV vancomycin however developed a suspected drug rash and antibiotic with switched to daptomycin.  He received a dose of IV oritavancin on 02/11/2020 and was discharged on a 6-week course of oral doxycycline (patient did not fill until 04/16/2020).  During that hospitalization blood cultures remained negative. TTE 02/07/2020 again showed tricuspid valve endocarditis with severe TR. CT surgery recommended conservative nonsurgical management.   He reports snorting opiates several days ago.  He denies any injection drug use over the last couple months.  He denies any recent cocaine or alcohol use.  He smokes half pack of cigarettes per day.  He denies using any recreational benzodiazepines.  Initial vitals stable, Urinalysis negative for UTI.  COVID-19 negative. UDS positive for THC. Portable chest x-ray shows significant improvement of previous bilateral pulmonary infiltrates.  Patient was given IV Lasix 20 mg once and OxyIR 5 mg.  EDP discussed with on-call infectious disease who recommended medical admission and hold antibiotics. EDP also discussed with on-call CT surgery who recommended repeat  echocardiogram and vascular surgery who felt there was no vascular surgery needed at this time.    He was admitted to hospital service.  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  Recommended punch biopsy.  I have consulted surgery for that.  Patient has refused punch biopsy twice so far.  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  Blood cultures were negative at that time.  CTA showed pulmonary septic emboli.  Was initially on IV vancomycin which was discontinued and switched to daptomycin due to questionable drug rash.  Received 1 dose IV oritavancin and was discharged with planned 6-week course of oral doxycycline which he did not fill until 04/16/2020.  -Follow blood cultures which are negative today. -Holding antibiotics at this time per ID recommendation.  Received 20 mg of IV Lasix in the ED. repeat echo shows tricuspid wall vegetation which is stable.  Leukoclassic vasculitis bilateral lower extremities: Could be secondary to combination of subacute/chronic endocarditis, hepatitis C or drug associated such as levamisole vasculitis or simply worsened related to aggressive scratching.  Patient has been started on prednisone 30 mg p.o. daily by ID.  Surgery was consulted over the weekend and they tried to do punch biopsy yesterday but patient refused.  Patient's lesions as per my evaluation yesterday have started to dry up.  He did not allow me to remove his dressing today in order for me to directly visualize that due to pain.  Surgery.  This morning once again saw  him and he refused biopsy 1 more time.  I saw this patient after that and I have convinced him to have the biopsy in order  to establish correct diagnosis.  He is now willing to do that.  I have sent a secure message to surgery PA Meuth brooke and have requested to try 1 more time.  Appreciate surgery as well as ID help.  Polysubstance use disorder: Reports snorting opiates but no recent injection drug use.  UDS is positive for THC and amphetamines.  Denies recent alcohol or use of recreational benzodiazepines.  Smoking half pack cigarettes per day, nicotine patch provided.  Chronic hepatitis C: Untreated.  Follow-up with infectious disease.  DVT prophylaxis: enoxaparin (LOVENOX) injection 40 mg Start: 04/28/20 2200   Code Status: Full Code  Family Communication:  None present at bedside.  Plan of care discussed with patient in length and he verbalized understanding and agreed with it.  Status is: Inpatient  Remains inpatient appropriate because:Inpatient level of care appropriate due to severity of illness   Dispo: The patient is from: Home              Anticipated d/c is to: Home              Anticipated d/c date is: 2 days              Patient currently is not medically stable to d/c.   Difficult to place patient No    Estimated body mass index is 17.9 kg/m as calculated from the following:   Height as of this encounter: 6' (1.829 m).   Weight as of this encounter: 59.9 kg.      Nutritional status:               Consultants:   ID  Procedures:   None  Antimicrobials:  Anti-infectives (From admission, onward)   None         Subjective: Seen and examined.  Remains frustrated and angry as usual.  Did not allow me to remove his dressing so I could see the lesions.  Continues to claim " why this biopsy was not done back in October when he was here" and I continue explained to him that I was not part of his medical team at that point in time and I cannot answer that question.  However, we have tried our best to get the biopsy done as soon as possible but he continues to refuse.   After all the talk, he is now willing to give it a shot.  Objective: Vitals:   05/02/20 0710 05/02/20 1412 05/02/20 2110 05/03/20 0445  BP: 130/88 (!) 132/96 117/78 125/84  Pulse: 72 83 88 74  Resp: 18 15 17 18   Temp: 98.2 F (36.8 C) 98.8 F (37.1 C) 98.2 F (36.8 C) 98 F (36.7 C)  TempSrc: Oral Oral Oral Oral  SpO2: 99% 99% 98% 99%  Weight:      Height:        Intake/Output Summary (Last 24 hours) at 05/03/2020 1054 Last data filed at 05/03/2020 1000 Gross per 24 hour  Intake 2160 ml  Output 2475 ml  Net -315 ml   Filed Weights   04/30/20 0500 05/01/20 0500 05/02/20 0500  Weight: 65.1 kg 63.1 kg 59.9 kg    Examination:  General exam: Appears calm and comfortable  Respiratory system: Clear to auscultation. Respiratory effort normal. Cardiovascular system: S1 & S2 heard, RRR. No JVD, murmurs, rubs, gallops or clicks. No  pedal edema. Gastrointestinal system: Abdomen is nondistended, soft and nontender. No organomegaly or masses felt. Normal bowel sounds heard. Central nervous system: Alert and oriented. No focal neurological deficits. Extremities: Symmetric 5 x 5 power. Skin: Patient did not allow me to remove dressings in bilateral lower extremities. Psychiatry: Judgement and insight appear poor  Data Reviewed: I have personally reviewed following labs and imaging studies  CBC: Recent Labs  Lab 04/28/20 1600 04/29/20 0407 05/01/20 0442 05/03/20 0459  WBC 6.6 6.4 10.2 12.1*  NEUTROABS 4.7  --  6.5  --   HGB 11.1* 11.7* 11.6* 12.5*  HCT 36.3* 36.9* 36.8* 39.5  MCV 90.3 87.4 87.2 86.1  PLT 224 221 245 348   Basic Metabolic Panel: Recent Labs  Lab 04/28/20 1600 04/29/20 0407 05/01/20 0442 05/02/20 0436 05/03/20 0459  NA 138 139 132* 134* 133*  K 4.3 4.2 4.2 4.2 4.2  CL 101 102 101 104 102  CO2 26 29 25 22  21*  GLUCOSE 102* 116* 119* 76 112*  BUN 24* 25* 24* 25* 27*  CREATININE 0.94 0.97 0.81 0.77 0.76  CALCIUM 9.1 8.9 8.5* 8.8* 8.7*    GFR: Estimated Creatinine Clearance: 110.2 mL/min (by C-G formula based on SCr of 0.76 mg/dL). Liver Function Tests: Recent Labs  Lab 04/28/20 1600 04/29/20 0407 05/01/20 0442 05/02/20 0436  AST 52* 46* 36 35  ALT 37 35 34 38  ALKPHOS 158* 179* 162* 159*  BILITOT 0.9 0.7 0.3 0.6  PROT 7.4 7.1 6.9 7.2  ALBUMIN 3.2* 2.9* 2.7* 2.9*   No results for input(s): LIPASE, AMYLASE in the last 168 hours. No results for input(s): AMMONIA in the last 168 hours. Coagulation Profile: Recent Labs  Lab 04/28/20 1600  INR 1.2   Cardiac Enzymes: No results for input(s): CKTOTAL, CKMB, CKMBINDEX, TROPONINI in the last 168 hours. BNP (last 3 results) No results for input(s): PROBNP in the last 8760 hours. HbA1C: No results for input(s): HGBA1C in the last 72 hours. CBG: No results for input(s): GLUCAP in the last 168 hours. Lipid Profile: No results for input(s): CHOL, HDL, LDLCALC, TRIG, CHOLHDL, LDLDIRECT in the last 72 hours. Thyroid Function Tests: No results for input(s): TSH, T4TOTAL, FREET4, T3FREE, THYROIDAB in the last 72 hours. Anemia Panel: No results for input(s): VITAMINB12, FOLATE, FERRITIN, TIBC, IRON, RETICCTPCT in the last 72 hours. Sepsis Labs: Recent Labs  Lab 04/28/20 1600  LATICACIDVEN 2.3*    Recent Results (from the past 240 hour(s))  Blood culture (routine x 2)     Status: None (Preliminary result)   Collection Time: 04/28/20  4:00 PM   Specimen: BLOOD RIGHT FOREARM  Result Value Ref Range Status   Specimen Description   Final    BLOOD RIGHT FOREARM Performed at [facility], [address], [place], [place]    Special Requests   Final    BOTTLES DRAWN AEROBIC AND ANAEROBIC Blood Culture results may not be optimal due to an inadequate volume of blood received in culture bottles Performed at [facility], [address] [place], [place]    Culture   Final    NO GROWTH 4 DAYS Performed at [facility], [address] [place], [place]    Report Status PENDING  Incomplete  Resp Panel by RT-PCR (Flu A&B, Covid) Nasopharyngeal Swab     Status: None   Collection Time: 04/28/20  6:56 PM   Specimen: Nasopharyngeal Swab; Nasopharyngeal(NP) swabs in vial transport  medium  Result Value Ref Range Status   SARS Coronavirus 2 by RT PCR NEGATIVE NEGATIVE Final    Comment: (NOTE) SARS-CoV-2 target nucleic acids are NOT DETECTED.  The SARS-CoV-2 RNA is generally detectable in upper respiratory specimens during the acute phase of infection. The lowest concentration of SARS-CoV-2 viral copies this assay can detect is 138 copies/mL. A negative result does not preclude SARS-Cov-2 infection and should not be used as the sole basis for treatment or other patient management decisions. A negative result may occur with  improper specimen collection/handling, submission of specimen other than nasopharyngeal swab, presence of viral mutation(s) within the areas targeted by this assay, and inadequate number of viral copies(<138 copies/mL). A negative result must be combined with clinical observations, patient history, and epidemiological information. The expected result is Negative.  Fact Sheet for Patients:  BloggerCourse.com  Fact Sheet for Healthcare Providers:  SeriousBroker.it  This test is no t yet approved or cleared by the [place] FDA and  has been authorized for detection and/or diagnosis of SARS-CoV-2 by FDA under an Emergency Use Authorization (EUA). This EUA will remain  in effect (meaning this test can be used) for the duration of the COVID-19 declaration under Section 564(b)(1) of the Act, 21 U.S.C.section 360bbb-3(b)(1), unless the authorization is terminated  or revoked sooner.       Influenza A by PCR NEGATIVE NEGATIVE Final   Influenza B by PCR NEGATIVE NEGATIVE Final    Comment: (NOTE) The Xpert Xpress  SARS-CoV-2/FLU/RSV plus assay is intended as an aid in the diagnosis of influenza from Nasopharyngeal swab specimens and should not be used as a sole basis for treatment. Nasal washings and aspirates are unacceptable for Xpert Xpress SARS-CoV-2/FLU/RSV testing.  Fact Sheet for Patients: BloggerCourse.com  Fact Sheet for Healthcare Providers: SeriousBroker.it  This test is not yet approved or cleared by the [place] FDA and has been authorized for detection and/or diagnosis of SARS-CoV-2 by FDA under an Emergency Use Authorization (EUA). This EUA will remain in effect (meaning this test can be used) for the duration of the COVID-19 declaration under Section 564(b)(1) of the Act, 21 U.S.C. section 360bbb-3(b)(1), unless the authorization is terminated or revoked.  Performed at [facility], [address], [place], [place] [address]   Blood culture (routine x 2)     Status: None (Preliminary result)   Collection Time: 04/28/20 10:49 PM   Specimen: BLOOD LEFT HAND  Result Value Ref Range Status   Specimen Description   Final    BLOOD LEFT HAND Performed at [facility], [address] [place], [place] [address]    Special Requests   Final    BOTTLES DRAWN AEROBIC ONLY Blood Culture results may not be optimal due to an inadequate volume of blood received in culture bottles Performed at [facility], [address], [place], [place] [address]    Culture   Final    NO GROWTH 4 DAYS Performed at [facility], [address], [place], [place] [address]    Report Status PENDING  Incomplete  MRSA PCR Screening     Status: Abnormal   Collection Time: 04/28/20 11:57 PM   Specimen: Nasal Mucosa; Nasopharyngeal  Result Value Ref Range Status   MRSA by PCR POSITIVE (A) NEGATIVE Final    Comment:        The GeneXpert MRSA Assay (FDA approved for NASAL specimens only),  is one component of a comprehensive MRSA colonization  surveillance program. It is not intended to diagnose MRSA infection nor to guide or monitor treatment for MRSA infections. RESULT CALLED TO, READ BACK BY AND VERIFIED WITH: HENSON C. 02.18.22 @ 0223 BY MECIAL J. Performed at [facility], [address], [place], [place] [address]       Radiology Studies: No results found.  Scheduled Meds: . Chlorhexidine Gluconate Cloth  6 each Topical Q0600  . enoxaparin (LOVENOX) injection  40 mg Subcutaneous Q24H  . melatonin  3 mg Oral QHS  . nicotine  21 mg Transdermal Daily  . oxyCODONE  10 mg Oral Q12H  . predniSONE  30 mg Oral Q breakfast  . sodium chloride flush  3 mL Intravenous Q12H   Continuous Infusions:   LOS: 3 days   Time spent: 30-minute   Hughie Closs, MD [facility]  05/03/2020, 10:54 AM   To contact the attending provider between 7A-7P or the covering provider during after hours 7P-7A, please log into the web site [email or web address].

## 2020-05-03 NOTE — TOC Initial Note (Signed)
Transition of Care [facility]) - Initial/Assessment Note    Patient Details  Name: Alex [name] MRN: [id number] Date of Birth: September 23, 1985  Transition of Care (TOC) CM/SW Contact:    Armanda Heritage, RN Phone Number: 05/03/2020, 1:08 PM  Clinical Narrative:                 CM spoke with patient regarding substance abuse. Patient reports he has gone through rehab in the past, approximately 3 times and while it helped for a short while he did relapse. Patient ahres that his sister died recently and this has made him think about his choices/options.  Patient's dad was visiting and patient gave CM permission to speak with dad as well.  Patient reports and dad confirms plan is for patient to discharge home with his dad.  CM provided patient with resources for substance abuse treatment including AA/NA meetings, outpatient treatment and inpatient treatment options.  CM also spoke with patient about need for pcp follow-up and arranged an appointment with [facility] on 06/02/20 at 1030 to establish care.  Patient and dad also ask about anticipated dc date, they are hopeful patient can dc in time for his sisters funeral but do not know what date that will be yet due to the circumstances surrounding her death. CM reached out to MD and relayed this information.  At this time we are awaiting biopsy of patient's leg wounds, it appears patient is now agreeable to this procedure.     Expected Discharge Plan: Home/Self Care Barriers to Discharge: No Barriers Identified   Patient Goals and CMS Choice Patient states their goals for this hospitalization and ongoing recovery are:: to go home      Expected Discharge Plan and Services Expected Discharge Plan: Home/Self Care   Discharge Planning Services: CM Consult   Living arrangements for the past 2 months: Homeless                                      Prior Living Arrangements/Services Living arrangements for the past 2 months: Homeless   Patient  language and need for interpreter reviewed:: Yes        Need for Family Participation in Patient Care: No (Comment) Care giver support system in place?: Yes (comment)   Criminal Activity/Legal Involvement Pertinent to Current Situation/Hospitalization: No - Comment as needed  Activities of Daily Living Home Assistive Devices/Equipment: None ADL Screening (condition at time of admission) Patient's cognitive ability adequate to safely complete daily activities?: Yes Is the patient deaf or have difficulty hearing?: No Does the patient have difficulty seeing, even when wearing glasses/contacts?: No Does the patient have difficulty concentrating, remembering, or making decisions?: No Patient able to express need for assistance with ADLs?: Yes Does the patient have difficulty dressing or bathing?: No Independently performs ADLs?: Yes (appropriate for developmental age) Does the patient have difficulty walking or climbing stairs?: No Weakness of Legs: None Weakness of Arms/Hands: None  Permission Sought/Granted                  Emotional Assessment Appearance:: Appears stated age Attitude/Demeanor/Rapport: Engaged Affect (typically observed): Accepting Orientation: : Oriented to Self,Oriented to Place,Oriented to  Time,Oriented to Situation   Psych Involvement: No (comment)  Admission diagnosis:  Rash [R21] Purpura (HCC) [D69.2] Cellulitis of both lower extremities [L03.115, L03.116] Endocarditis of tricuspid valve [I07.9] Acute on chronic right-sided heart failure (HCC) [I50.813] Vasculitis (HCC) [  I77.6] Patient Active Problem List   Diagnosis Date Noted  . Substance use disorder 04/28/2020  . Nonspecific chest pain   . Septic embolism (HCC) 02/07/2020  . Multifocal pneumonia   . MRSA bacteremia   . Vasculitis (HCC)   . Malnutrition of moderate degree 12/25/2019  . Pleural effusion, bilateral   . Chronic hepatitis C without hepatic coma (HCC)   . Infective myositis    . Acute septic pulmonary embolism (HCC)   . Goals of care, counseling/discussion   . Palliative care encounter   . Endocarditis 12/14/2019  . Acute CHF (congestive heart failure) (HCC) 12/14/2019  . Anemia 12/14/2019  . Agitation 08/19/2019  . Endocarditis of tricuspid valve   . IVDU (intravenous drug user)   . Sepsis (HCC) 12/28/2018  . Alcohol use disorder, severe, dependence (HCC) 09/11/2018  . Substance induced mood disorder (HCC) 04/24/2016  . Polysubstance dependence (HCC) 04/23/2016  . Major depressive disorder, single episode, severe without psychotic features (HCC) 04/23/2016  . 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 Tobacco use disorder 02/01/2014  . ADHD (attention deficit hyperactivity disorder)    PCP:  Patient, No Pcp Per Pharmacy:   [facility], [place] - [address] [place] [address] Phone: [phone] Fax: [phone]  [id number] - [place], [place] - [address] AT [facility] [address] [place] [address] Phone: [phone] Fax: [phone]     Social Determinants of Health (SDOH) Interventions    Readmission Risk Interventions Readmission Risk Prevention Plan 01/12/2020  Transportation Screening Complete  HRI or Home Care Consult Complete  Social Work Consult for Recovery Care Planning/Counseling Complete  Palliative Care Screening Not Applicable  Medication Review Oceanographer) Complete  Some recent data might be hidden

## 2020-05-04 LAB — CULTURE, BLOOD (ROUTINE X 2)
Culture: NO GROWTH
Culture: NO GROWTH

## 2020-05-04 MED ORDER — PREDNISONE 10 MG PO TABS: 30.0000 mg | ORAL_TABLET | Freq: Every day | ORAL | 0 refills | Status: AC

## 2020-05-04 NOTE — Discharge Summary (Signed)
Physician Discharge Summary  [name] M Fitting ZOX:[id number] DOB: 10-08-1985 DOA: 04/28/2020  PCP: Patient, No Pcp Per  Admit date: 04/28/2020 Discharge date: 05/04/2020  Admitted From: Home Disposition:  Home  Recommendations for Outpatient Follow-up:  1. Follow up with PCP in 1 week 2. Follow up with Dr. Renold DonVu, Infectious Disease, as scheduled on 05/11/20 3. Continue prednisone 30 mg daily, end date 05/13/2020 4. 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   Patient with known tricuspid valve MRSA endocarditis with multiple recent admissions. Most recently admitted 02/07/2020-02/12/2020 at which time he was found to have pulmonary septic emboli. He was initially treated with IV vancomycin however developed a suspected drug rash and antibiotic with switched to daptomycin. He received a dose of IV oritavancin on 02/11/2020 and was discharged on a 6-week course of oral doxycycline(patient did not fill until 04/16/2020). During that hospitalization blood cultures remained negative. TTE 02/07/2020 again showed tricuspid valve endocarditis with severe TR. CT surgery recommended conservative nonsurgical management.   He reports snorting opiates several days ago. He denies any injection drug use over the last couple months. He denies any recent cocaine or alcohol use. He smokes half pack of cigarettes per day. He denies using any recreational benzodiazepines.  Initial vitals  stable,Urinalysis negative for UTI.  COVID-19 negative.UDS positive for THC. Portable chest x-ray shows significant improvement of previous bilateral pulmonary infiltrates.  Patient was given IV Lasix 20 mg once and OxyIR 5 mg. EDP discussed with on-call infectious disease who recommended medical admission and hold antibiotics. EDP also discussed with on-call CT surgery who recommended repeat echocardiogram and vascular surgery who felt there was no vascular surgery needed at this time.  He was admitted to hospital service.  Blood culture was negative.  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    Patient underwent skin biopsy on 2/22.  He is discharged home on oral prednisone.  He is to follow-up outpatient with infectious disease.  Discharge Diagnoses:  Principal Problem:   Vasculitis (HCC) Active Problems:   Endocarditis of tricuspid valve   Chronic hepatitis C without hepatic coma (HCC)   Substance use disorder   Discharge Instructions  Discharge Instructions    Call MD for:  difficulty breathing, headache or visual disturbances   Complete by: As directed    Call MD for:  extreme fatigue   Complete by: As directed    Call MD for:  persistant dizziness or light-headedness   Complete by: As directed    Call MD for:  persistant nausea and vomiting   Complete by: As directed    Call MD for:  redness, tenderness, or signs of infection (pain, swelling, redness, odor or green/yellow discharge around incision site)   Complete by: As directed    Call MD for:  severe uncontrolled pain   Complete by: As directed    Call MD for:  temperature >100.4   Complete by: As directed    Discharge instructions   Complete by: As directed    You were cared for by a hospitalist during your hospital stay.  If you have any questions about your discharge medications or the care you received while you were in the hospital after you are  discharged, you can call the unit and ask to speak with the hospitalist on call if the hospitalist that took care of you is not available. Once you are discharged, your primary care physician will handle any further medical issues. Please note that NO REFILLS for any discharge medications will be authorized once you are discharged, as it is imperative that you return to your primary care physician (or establish a relationship with a primary care physician if you do not have one) for your aftercare needs so that they can reassess your need for medications and monitor your lab values.   Discharge wound care:   Complete by: As directed    Wound care to bilateral LEs and to right foot ulcerations:  Cleanse with NS, pat gently, but thoroughly dry. Cover with folded pieces of xeroform gauze [place] # 294), top with dry gauze or ABD pads and secure with Kerlix roll gauze wrapping/paper tape. Change twice daily.   Increase activity slowly   Complete by: As directed      Allergies as of 05/04/2020      Reactions   Vancomycin Rash   Patient on vancomycin for 3 weeks, developed rash on his lower extremities. Did not resemble red man syndrome. Required change of abx      Medication List    STOP taking these medications   acetaminophen 325 MG tablet Commonly known as: TYLENOL   albuterol 108 (90 Base) MCG/ACT inhaler Commonly known as: Proventil HFA   doxycycline 100 MG tablet Commonly known as: VIBRA-TABS   oxyCODONE 5 MG immediate release tablet Commonly known as: Oxy IR/ROXICODONE   traMADol 50 MG tablet Commonly known as: ULTRAM     TAKE these medications   furosemide 20 MG tablet Commonly known as: LASIX Take 1 tablet (20 mg total) by mouth daily.   predniSONE 10 MG tablet Commonly known as: DELTASONE Take 3 tablets (30 mg total) by mouth daily with breakfast for 8 days. Start taking on: May 05, 2020            Discharge Care Instructions  (From admission, onward)          Start     Ordered   05/04/20 0000  Discharge wound care:       Comments: Wound care to bilateral LEs and to right foot ulcerations:  Cleanse with NS, pat gently, but thoroughly dry. Cover with folded pieces of xeroform gauze [place] # 294), top with dry gauze or ABD pads and secure with Kerlix roll gauze wrapping/paper tape. Change twice daily.   05/04/20 1234          Follow-up Information    [facility] Follow up.   Why: you have an appointment to establish primary care on 06/02/20 at 1030. please call clinic to discuss and apply for financial assistance  Contact information: [address] [place] [address] [phone]       Raymondo Band, MD. Go on 05/11/2020.   Specialty: Infectious Diseases Contact information: [address] [place] [address] [phone]              Allergies  Allergen Reactions  . Vancomycin Rash    Patient on vancomycin for 3 weeks, developed rash on his lower extremities. Did not resemble red man syndrome. Required change of abx  Consultations:  General surgery  ID    Procedures/Studies: DG Chest Port 1 View  Result Date: 04/28/2020 CLINICAL DATA:  Worsening heart failure. EXAM: PORTABLE CHEST 1 VIEW COMPARISON:  Chest x-ray 02/15/2020. FINDINGS: Mediastinum and hilar structures normal. Cardiomegaly. No pulmonary venous congestion. Interim near complete clearing of bilateral pulmonary infiltrates. No pleural effusion or pneumothorax. Mild scoliosis thoracic spine. IMPRESSION: 1. Interim near complete clearing of bilateral pulmonary infiltrates. 2.  Cardiomegaly.  No pulmonary venous congestion. Electronically Signed   By: Maisie Fus  Register   On: 04/28/2020 16:31   ECHOCARDIOGRAM COMPLETE  Result Date: 04/29/2020    ECHOCARDIOGRAM REPORT   Patient Name:   BAYLOR CORTEZ Date of Exam: 04/29/2020 Medical Rec #:  [id number]      Height:       72.0 in Accession #:     [id number]     Weight:       130.5 lb Date of Birth:  Feb 07, 1986      BSA:          1.777 m Patient Age:    34 years       BP:           134/101 mmHg Patient Gender: M              HR:           86 bpm. Exam Location:  Inpatient Procedure: 2D Echo, Cardiac Doppler and Color Doppler Indications:    Endocarditis  History:        Patient has prior history of Echocardiogram examinations, most                 recent 02/07/2020. Endocarditis and TV endocarditis,                 Signs/Symptoms:Bacteremia; Risk Factors:Current Smoker. MRSA,                 polysubstance abuse, Pul.septic embolic, Hep. C.  Sonographer:    Lavenia Atlas Referring Phys: [id number] VISHAL R PATEL IMPRESSIONS  1. Left ventricular ejection fraction, by estimation, is 60 to 65%. The left ventricle has normal function. The left ventricle has no regional wall motion abnormalities. Left ventricular diastolic parameters are indeterminate.  2. Right ventricular systolic function is normal. The right ventricular size is severely enlarged.  3. Right atrial size was severely dilated.  4. The mitral valve is normal in structure. No evidence of mitral valve regurgitation. No evidence of mitral stenosis.  5. Tricuspid valve vegetation, mobile mass (1.1 x 0.6 cm). The tricuspid valve is abnormal. Tricuspid valve regurgitation is torrential.  6. The aortic valve is normal in structure. Aortic valve regurgitation is not visualized. No aortic stenosis is present.  7. The inferior vena cava is dilated in size with <50% respiratory variability, suggesting right atrial pressure of 15 mmHg. FINDINGS  Left Ventricle: Left ventricular ejection fraction, by estimation, is 60 to 65%. The left ventricle has normal function. The left ventricle has no regional wall motion abnormalities. The left ventricular internal cavity size was normal in size. There is  no left ventricular hypertrophy. Left ventricular diastolic parameters are indeterminate. Right Ventricle: The right  ventricular size is severely enlarged. No increase in right ventricular wall thickness. Right ventricular systolic function is normal. Left Atrium: Left atrial size was normal in size. Right Atrium: Right atrial size was severely dilated. Pericardium: There is no evidence of pericardial effusion. Mitral Valve: The mitral valve is normal in structure. No evidence of mitral  valve regurgitation. No evidence of mitral valve stenosis. Tricuspid Valve: Tricuspid valve vegetation, mobile mass (1.1 x 0.6 cm). The tricuspid valve is abnormal. Tricuspid valve regurgitation is torrential. No evidence of tricuspid stenosis. Aortic Valve: The aortic valve is normal in structure. Aortic valve regurgitation is not visualized. No aortic stenosis is present. Pulmonic Valve: The pulmonic valve was normal in structure. Pulmonic valve regurgitation is not visualized. No evidence of pulmonic stenosis. Aorta: The aortic root is normal in size and structure. Venous: The inferior vena cava is dilated in size with less than 50% respiratory variability, suggesting right atrial pressure of 15 mmHg. IAS/Shunts: No atrial level shunt detected by color flow Doppler. Donato Schultz MD Electronically signed by Donato Schultz MD Signature Date/Time: 04/29/2020/3:21:12 PM    Final    VAS [place] LOWER EXTREMITY VENOUS (DVT)  Result Date: 04/30/2020  Lower Venous DVT Study Indications: Edema. Other Indications: Hx of endocarditis, IVDU. Comparison Study: no prior Performing Technologist: Blanch Media RVS  Examination Guidelines: A complete evaluation includes B-mode imaging, spectral Doppler, color Doppler, and power Doppler as needed of all accessible portions of each vessel. Bilateral testing is considered an integral part of a complete examination. Limited examinations for reoccurring indications may be performed as noted. The reflux portion of the exam is performed with the patient in reverse Trendelenburg.   +---------+---------------+---------+-----------+----------+-------------------+ RIGHT    CompressibilityPhasicitySpontaneityPropertiesThrombus Aging      +---------+---------------+---------+-----------+----------+-------------------+ CFV      Full           Yes      Yes                                      +---------+---------------+---------+-----------+----------+-------------------+ SFJ      Full                                                             +---------+---------------+---------+-----------+----------+-------------------+ FV Prox  Full                                                             +---------+---------------+---------+-----------+----------+-------------------+ FV Mid   Full                                                             +---------+---------------+---------+-----------+----------+-------------------+ FV DistalFull                                                             +---------+---------------+---------+-----------+----------+-------------------+ PFV      Full                                                             +---------+---------------+---------+-----------+----------+-------------------+  POP      Full           Yes      Yes                                      +---------+---------------+---------+-----------+----------+-------------------+ PTV      Full                                                             +---------+---------------+---------+-----------+----------+-------------------+ PERO                                                  Not well visualized +---------+---------------+---------+-----------+----------+-------------------+   +---------+---------------+---------+-----------+----------+--------------+ LEFT     CompressibilityPhasicitySpontaneityPropertiesThrombus Aging +---------+---------------+---------+-----------+----------+--------------+ CFV      Full            Yes      Yes                                 +---------+---------------+---------+-----------+----------+--------------+ SFJ      Full                                                        +---------+---------------+---------+-----------+----------+--------------+ FV Prox  Full                                                        +---------+---------------+---------+-----------+----------+--------------+ FV Mid   Full                                                        +---------+---------------+---------+-----------+----------+--------------+ FV DistalFull                                                        +---------+---------------+---------+-----------+----------+--------------+ PFV      Full                                                        +---------+---------------+---------+-----------+----------+--------------+ POP      Full           Yes      Yes                                 +---------+---------------+---------+-----------+----------+--------------+  PTV      Full                                                        +---------+---------------+---------+-----------+----------+--------------+ PERO     Full                                                        +---------+---------------+---------+-----------+----------+--------------+     Summary: BILATERAL: - No evidence of deep vein thrombosis seen in the lower extremities, bilaterally. - No evidence of superficial venous thrombosis in the lower extremities, bilaterally. -No evidence of popliteal cyst, bilaterally. RIGHT: - Ultrasound characteristics of enlarged lymph nodes are noted in the groin.  LEFT: - Ultrasound characteristics of enlarged lymph nodes noted in the groin.  *See table(s) above for measurements and observations. Electronically signed by Heath Lark on 04/30/2020 at 10:54:10 AM.    Final        Discharge Exam: Vitals:   05/03/20 2209 05/04/20  0639  BP: 125/74 108/77  Pulse: 95 85  Resp:  18  Temp:  97.7 F (36.5 C)  SpO2: 99% 98%    General: Pt is alert, awake, not in acute distress Cardiovascular: RRR, S1/S2 +, no edema Respiratory: CTA bilaterally, no wheezing, no rhonchi, no respiratory distress, no conversational dyspnea  Abdominal: Soft, NT, ND, bowel sounds + Extremities: no edema, no cyanosis, bilateral LEs in clean and dry dressing  Psych: Stable    The results of significant diagnostics from this hospitalization (including imaging, microbiology, ancillary and laboratory) are listed below for reference.     Microbiology: Recent Results (from the past 240 hour(s))  Blood culture (routine x 2)     Status: None   Collection Time: 04/28/20  4:00 PM   Specimen: BLOOD RIGHT FOREARM  Result Value Ref Range Status   Specimen Description   Final    BLOOD RIGHT FOREARM Performed at [facility], [address], [place], [place] [address]    Special Requests   Final    BOTTLES DRAWN AEROBIC AND ANAEROBIC Blood Culture results may not be optimal due to an inadequate volume of blood received in culture bottles Performed at [facility], [address], [place], [place] [address]    Culture   Final    NO GROWTH 5 DAYS Performed at [facility], [address], [place], [place] [address]    Report Status 05/04/2020 FINAL  Final  Resp Panel by RT-PCR (Flu A&B, Covid) Nasopharyngeal Swab     Status: None   Collection Time: 04/28/20  6:56 PM   Specimen: Nasopharyngeal Swab; Nasopharyngeal(NP) swabs in vial transport medium  Result Value Ref Range Status   SARS Coronavirus 2 by RT PCR NEGATIVE NEGATIVE Final    Comment: (NOTE) SARS-CoV-2 target nucleic acids are NOT DETECTED.  The SARS-CoV-2 RNA is generally detectable in upper respiratory specimens during the acute phase of infection. The lowest concentration of SARS-CoV-2 viral copies this assay can detect is 138  copies/mL. A negative result does not preclude SARS-Cov-2 infection and should not be used as the sole basis for treatment or other patient management decisions. A  negative result may occur with  improper specimen collection/handling, submission of specimen other than nasopharyngeal swab, presence of viral mutation(s) within the areas targeted by this assay, and inadequate number of viral copies(<138 copies/mL). A negative result must be combined with clinical observations, patient history, and epidemiological information. The expected result is Negative.  Fact Sheet for Patients:  BloggerCourse.com  Fact Sheet for Healthcare Providers:  SeriousBroker.it  This test is no t yet approved or cleared by the [place] FDA and  has been authorized for detection and/or diagnosis of SARS-CoV-2 by FDA under an Emergency Use Authorization (EUA). This EUA will remain  in effect (meaning this test can be used) for the duration of the COVID-19 declaration under Section 564(b)(1) of the Act, 21 U.S.C.section 360bbb-3(b)(1), unless the authorization is terminated  or revoked sooner.       Influenza A by PCR NEGATIVE NEGATIVE Final   Influenza B by PCR NEGATIVE NEGATIVE Final    Comment: (NOTE) The Xpert Xpress SARS-CoV-2/FLU/RSV plus assay is intended as an aid in the diagnosis of influenza from Nasopharyngeal swab specimens and should not be used as a sole basis for treatment. Nasal washings and aspirates are unacceptable for Xpert Xpress SARS-CoV-2/FLU/RSV testing.  Fact Sheet for Patients: BloggerCourse.com  Fact Sheet for Healthcare Providers: SeriousBroker.it  This test is not yet approved or cleared by the [place] FDA and has been authorized for detection and/or diagnosis of SARS-CoV-2 by FDA under an Emergency Use Authorization (EUA). This EUA will remain in effect (meaning  this test can be used) for the duration of the COVID-19 declaration under Section 564(b)(1) of the Act, 21 U.S.C. section 360bbb-3(b)(1), unless the authorization is terminated or revoked.  Performed at [facility], [address] [place], [place] [address]   Blood culture (routine x 2)     Status: None   Collection Time: 04/28/20 10:49 PM   Specimen: BLOOD LEFT HAND  Result Value Ref Range Status   Specimen Description   Final    BLOOD LEFT HAND Performed at [facility], [address], [place], [place] [address]    Special Requests   Final    BOTTLES DRAWN AEROBIC ONLY Blood Culture results may not be optimal due to an inadequate volume of blood received in culture bottles Performed at [facility], [address], [place], [place] [address]    Culture   Final    NO GROWTH 5 DAYS Performed at [facility], [address] [place], [place] [address]    Report Status 05/04/2020 FINAL  Final  MRSA PCR Screening     Status: Abnormal   Collection Time: 04/28/20 11:57 PM   Specimen: Nasal Mucosa; Nasopharyngeal  Result Value Ref Range Status   MRSA by PCR POSITIVE (A) NEGATIVE Final    Comment:        The GeneXpert MRSA Assay (FDA approved for NASAL specimens only), is one component of a comprehensive MRSA colonization surveillance program. It is not intended to diagnose MRSA infection nor to guide or monitor treatment for MRSA infections. RESULT CALLED TO, READ BACK BY AND VERIFIED WITH: HENSON C. 02.18.22 @ 0223 BY MECIAL J. Performed at [facility], [address], [place], [place] [address]      Labs: BNP (last 3 results) Recent Labs    12/14/19 0810 02/09/20 0509 04/28/20 1600  BNP 526.1* 395.2* 182.3*   Basic Metabolic Panel: Recent Labs  Lab 04/28/20 1600 04/29/20 0407  05/01/20 0442 05/02/20 0436 05/03/20 0459  NA 138 139 132* 134* 133*  K 4.3 4.2 4.2 4.2 4.2  CL  101 102 101 104 102  CO2 21*  GLUCOSE 102* 116* 119* 76 112*  BUN 24* 25* 24* 25* 27*  CREATININE 0.94 0.97 0.81 0.77 0.76  CALCIUM 9.1 8.9 8.5* 8.8* 8.7*   Liver Function Tests: Recent Labs  Lab 04/28/20 1600 04/29/20 0407 05/01/20 0442 05/02/20 0436  AST 52* 46* 36 35  ALT 37 35 34 38  ALKPHOS 158* 179* 162* 159*  BILITOT 0.9 0.7 0.3 0.6  PROT 7.4 7.1 6.9 7.2  ALBUMIN 3.2* 2.9* 2.7* 2.9*   No results for input(s): LIPASE, AMYLASE in the last 168 hours. No results for input(s): AMMONIA in the last 168 hours. CBC: Recent Labs  Lab 04/28/20 1600 04/29/20 0407 05/01/20 0442 05/03/20 0459  WBC 6.6 6.4 10.2 12.1*  NEUTROABS 4.7  --  6.5  --   HGB 11.1* 11.7* 11.6* 12.5*  HCT 36.3* 36.9* 36.8* 39.5  MCV 90.3 87.4 87.2 86.1  PLT 224 221 245 348   Cardiac Enzymes: No results for input(s): CKTOTAL, CKMB, CKMBINDEX, TROPONINI in the last 168 hours. BNP: Invalid input(s): POCBNP CBG: No results for input(s): GLUCAP in the last 168 hours. D-Dimer No results for input(s): DDIMER in the last 72 hours. Hgb A1c No results for input(s): HGBA1C in the last 72 hours. Lipid Profile No results for input(s): CHOL, HDL, LDLCALC, TRIG, CHOLHDL, LDLDIRECT in the last 72 hours. Thyroid function studies No results for input(s): TSH, T4TOTAL, T3FREE, THYROIDAB in the last 72 hours.  Invalid input(s): FREET3 Anemia work up No results for input(s): VITAMINB12, FOLATE, FERRITIN, TIBC, IRON, RETICCTPCT in the last 72 hours. Urinalysis    Component Value Date/Time   COLORURINE YELLOW 04/28/2020 1900   APPEARANCEUR CLEAR 04/28/2020 1900   LABSPEC 1.018 04/28/2020 1900   PHURINE 6.0 04/28/2020 1900   GLUCOSEU NEGATIVE 04/28/2020 1900   HGBUR SMALL (A) 04/28/2020 1900   BILIRUBINUR NEGATIVE 04/28/2020 1900   KETONESUR NEGATIVE 04/28/2020 1900   PROTEINUR NEGATIVE 04/28/2020 1900   NITRITE NEGATIVE 04/28/2020 1900   LEUKOCYTESUR NEGATIVE 04/28/2020 1900   Sepsis  Labs Invalid input(s): PROCALCITONIN,  WBC,  LACTICIDVEN Microbiology Recent Results (from the past 240 hour(s))  Blood culture (routine x 2)     Status: None   Collection Time: 04/28/20  4:00 PM   Specimen: BLOOD RIGHT FOREARM  Result Value Ref Range Status   Specimen Description   Final    BLOOD RIGHT FOREARM Performed at [facility], [address], [place], [place] [address]    Special Requests   Final    BOTTLES DRAWN AEROBIC AND ANAEROBIC Blood Culture results may not be optimal due to an inadequate volume of blood received in culture bottles Performed at [facility], [address] [place], [place] [address]    Culture   Final    NO GROWTH 5 DAYS Performed at [facility], [address] [place], [place] [address]    Report Status 05/04/2020 FINAL  Final  Resp Panel by RT-PCR (Flu A&B, Covid) Nasopharyngeal Swab     Status: None   Collection Time: 04/28/20  6:56 PM   Specimen: Nasopharyngeal Swab; Nasopharyngeal(NP) swabs in vial transport medium  Result Value Ref Range Status   SARS Coronavirus 2 by RT PCR NEGATIVE NEGATIVE Final    Comment: (NOTE) SARS-CoV-2 target nucleic acids are NOT  DETECTED.  The SARS-CoV-2 RNA is generally detectable in upper respiratory specimens during the acute phase of infection. The lowest concentration of SARS-CoV-2 viral copies this assay can detect is 138 copies/mL. A negative result does not preclude SARS-Cov-2 infection and should not be used as the sole basis for treatment or other patient management decisions. A negative result may occur with  improper specimen collection/handling, submission of specimen other than nasopharyngeal swab, presence of viral mutation(s) within the areas targeted by this assay, and inadequate number of viral copies(<138 copies/mL). A negative result must be combined with clinical observations, patient history, and epidemiological information. The expected  result is Negative.  Fact Sheet for Patients:  BloggerCourse.com  Fact Sheet for Healthcare Providers:  SeriousBroker.it  This test is no t yet approved or cleared by the [place] FDA and  has been authorized for detection and/or diagnosis of SARS-CoV-2 by FDA under an Emergency Use Authorization (EUA). This EUA will remain  in effect (meaning this test can be used) for the duration of the COVID-19 declaration under Section 564(b)(1) of the Act, 21 U.S.C.section 360bbb-3(b)(1), unless the authorization is terminated  or revoked sooner.       Influenza A by PCR NEGATIVE NEGATIVE Final   Influenza B by PCR NEGATIVE NEGATIVE Final    Comment: (NOTE) The Xpert Xpress SARS-CoV-2/FLU/RSV plus assay is intended as an aid in the diagnosis of influenza from Nasopharyngeal swab specimens and should not be used as a sole basis for treatment. Nasal washings and aspirates are unacceptable for Xpert Xpress SARS-CoV-2/FLU/RSV testing.  Fact Sheet for Patients: BloggerCourse.com  Fact Sheet for Healthcare Providers: SeriousBroker.it  This test is not yet approved or cleared by the [place] FDA and has been authorized for detection and/or diagnosis of SARS-CoV-2 by FDA under an Emergency Use Authorization (EUA). This EUA will remain in effect (meaning this test can be used) for the duration of the COVID-19 declaration under Section 564(b)(1) of the Act, 21 U.S.C. section 360bbb-3(b)(1), unless the authorization is terminated or revoked.  Performed at [facility], [address], [place], [place] [address]   Blood culture (routine x 2)     Status: None   Collection Time: 04/28/20 10:49 PM   Specimen: BLOOD LEFT HAND  Result Value Ref Range Status   Specimen Description   Final    BLOOD LEFT HAND Performed at [facility], [address], [place], [place] [address]    Special Requests   Final    BOTTLES DRAWN AEROBIC ONLY Blood Culture results may not be optimal due to an inadequate volume of blood received in culture bottles Performed at [facility], [address], [place], [place] [address]    Culture   Final    NO GROWTH 5 DAYS Performed at [facility], [address], [place], [place] [address]    Report Status 05/04/2020 FINAL  Final  MRSA PCR Screening     Status: Abnormal   Collection Time: 04/28/20 11:57 PM   Specimen: Nasal Mucosa; Nasopharyngeal  Result Value Ref Range Status   MRSA by PCR POSITIVE (A) NEGATIVE Final    Comment:        The GeneXpert MRSA Assay (FDA approved for NASAL specimens only), is one component of a comprehensive MRSA colonization surveillance program. It is not intended to diagnose MRSA infection nor to guide or monitor treatment for MRSA infections. RESULT CALLED TO, READ BACK BY AND VERIFIED WITH: HENSON C.  02.18.22 @ 0223 BY MECIAL J. Performed at [facility], [address] [place], [place] [address]      Patient was seen and examined on the day of discharge and was found to be in stable condition. Time coordinating discharge: 25 minutes including assessment and coordination of care, as well as examination of the patient.   SIGNED:  Noralee Stain, DO [facility] 05/04/2020, 12:34 PM

## 2020-05-04 NOTE — Progress Notes (Signed)
This was a follow-up visit with pt.  Pt's father was at bedside.  Chaplain re-established relationship of care and concern.  Pt. Expressed having no hope.  He has no religion b/c to have faith meant believing in something that isn't true.  Father began showing photo of pt's nephew.  There was a light in the pt's eye as he told of how that little boy is here because he saved his life once.  Chaplain wondered aloud with pt if this little boy can offer hope. This little boy may need to have his life saved again one day, suggesting by holding onto whatever hope this little boy represents may help him through this time of addiction and serve to model for his nephew how to stay sober and not use drugs.    Pt being discharged today.  Thanks to Child psychotherapist for responding to this pt and giving him encouragement and resources.  Vernell Morgans Chaplain

## 2020-05-04 NOTE — Progress Notes (Signed)
[facility]  Date of Admission:  04/28/2020           Reason for visit: Follow up on rash  Current antibiotics: None  Active Problems:   Endocarditis of tricuspid valve   Vasculitis (HCC)   Substance use disorder   ASSESSMENT:    1. 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 Hepatitis C: Untreated 4. Substance use disorder 5. History of vancomycin allergy listed in chart: Tolerated this well during last admission and do not suspect he has a true allergy  PLAN:    . Appreciate surgery evaluation for skin punch biopsy done yesterday . Continue prednisone 30 mg daily.  Anticipate 2-week course which will enable [place] to avoid a prolonged taper.  End date = 05/13/20 . ANCA titers and ANA negative.  Cryoglobulin order canceled by [facility] . Surgical pathology pending . Follow up scheduled with Dr Renold Don at [facility] on 05/11/20 at 3pm to follow-up on vasculitic skin rash and discuss treatment options for hepatitis C . We will remain available as needed but not continue to follow actively  SUBJECTIVE:   24 hour events:  No acute events noted Afebrile, T-max 98.2 Labs are stable, mild leukocytosis with prednisone blood cultures x2 finalized negative  Patient with no complaints this morning he is lying in bed.  No fevers or chills.  Tolerated punch biopsy yesterday.  Rash overall improved.   OBJECTIVE:   Blood pressure 108/77, pulse 85, temperature 97.7 F (36.5 C), resp. rate 18, height 6' (1.829 m), weight 59.9 kg, SpO2 98 %. Body mass index is 17.9 kg/m.  Physical Exam Constitutional:      Comments: Patient lying in bed, no acute distress, eyes closed, not very conversant  Pulmonary:     Effort: Pulmonary effort is normal. No respiratory distress.  Skin:    General: Skin is warm and dry.     Findings: Rash present.     Comments:  Diffuse vasculitic rash is greatly improved. Bilateral lower extremities are wrapped in clean dry gauze.      Lab Results: Lab Results  Component Value Date   WBC 12.1 (H) 05/03/2020   HGB 12.5 (L) 05/03/2020   HCT 39.5 05/03/2020   MCV 86.1 05/03/2020   PLT 348 05/03/2020    Lab Results  Component Value Date   NA 133 (L) 05/03/2020   K 4.2 05/03/2020   CO2 21 (L) 05/03/2020   GLUCOSE 112 (H) 05/03/2020   BUN 27 (H) 05/03/2020   CREATININE 0.76 05/03/2020   CALCIUM 8.7 (L) 05/03/2020   GFRNONAA >60 05/03/2020   GFRAA >60 12/15/2019    Lab Results  Component Value Date   ALT 38 05/02/2020   AST 35 05/02/2020   ALKPHOS 159 (H) 05/02/2020   BILITOT 0.6 05/02/2020       Component Value Date/Time   CRP 6.6 (H) 04/28/2020 1600       Component Value Date/Time   ESRSEDRATE 36 (H) 04/28/2020 2249     I have reviewed the micro and lab results in Epic.  Imaging: No results found.     Vedia Coffer for Infectious Disease [facility] [phone] pager 05/04/2020, 11:13 AM  I spent greater than 35 minutes with the patient including greater than 50% of time in face to face counsel of the patient and in coordination of their care.

## 2020-05-05 LAB — SURGICAL PATHOLOGY

## 2020-05-11 ENCOUNTER — Other Ambulatory Visit: Payer: Self-pay

## 2020-05-11 ENCOUNTER — Ambulatory Visit (INDEPENDENT_AMBULATORY_CARE_PROVIDER_SITE_OTHER): Payer: Self-pay | Admitting: Internal Medicine

## 2020-05-11 ENCOUNTER — Encounter: Payer: Self-pay | Admitting: Internal Medicine

## 2020-05-11 VITALS — Ht 71.0 in | Wt 140.0 lb

## 2020-05-11 DIAGNOSIS — B182 Chronic viral hepatitis C: Secondary | ICD-10-CM

## 2020-05-11 DIAGNOSIS — M310 Hypersensitivity angiitis: Secondary | ICD-10-CM

## 2020-05-11 MED ORDER — PREDNISONE 20 MG PO TABS: 20.0000 mg | ORAL_TABLET | Freq: Every day | ORAL | 0 refills | Status: AC

## 2020-05-11 NOTE — Patient Instructions (Addendum)
Take 2 more weeks of prednisone 20 mg daily for the rash  Labs today to assess for hep c  Ultrasound liver to be scheduled  I have also written for vicodin for your pain   I will refer you to dermatology for further management of the rash  F/u 2-4 weeks

## 2020-05-11 NOTE — Progress Notes (Signed)
[facility]  Patient Active Problem List   Diagnosis Date Noted  . Substance use disorder 04/28/2020  . Nonspecific chest pain   . Septic embolism (HCC) 02/07/2020  . Multifocal pneumonia   . MRSA bacteremia   . Vasculitis (HCC)   . Malnutrition of moderate degree 12/25/2019  . Pleural effusion, bilateral   . Chronic hepatitis C without hepatic coma (HCC)   . Infective myositis   . Acute septic pulmonary embolism (HCC)   . Goals of care, counseling/discussion   . Palliative care encounter   . Endocarditis 12/14/2019  . Acute CHF (congestive heart failure) (HCC) 12/14/2019  . Anemia 12/14/2019  . Agitation 08/19/2019  . Endocarditis of tricuspid valve   . IVDU (intravenous drug user)   . Sepsis (HCC) 12/28/2018  . Alcohol use disorder, severe, dependence (HCC) 09/11/2018  . Substance induced mood disorder (HCC) 04/24/2016  . Polysubstance dependence (HCC) 04/23/2016  . Major depressive disorder, single episode, severe without psychotic features (HCC) 04/23/2016  . 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 Tobacco use disorder 02/01/2014  . ADHD (attention deficit hyperactivity disorder)       Subjective:    Patient ID: Alex [name], male    DOB: 04-06-1985, 35 y.o.   MRN: [id number]  No chief complaint on file.   HPI:  Alex [name] is a 35 y.o. male IVDU here for f/u tv IE  Of note, he had tv endocarditis 12/2018 s/p vanc --> oritavancin and transitioned to 6 weeks bactrim   hre represented to [facility] 10/4 for mrsa bacteremia and echo finding tv vegetations He was admitted early 12/2009 with sepsis found to have mrsa ie endocarditis Initially started on vanc Tee with tv vegetations/regurg Complicated by pulm cavitations and psoas and lumbar parasinous myositis cts evaluation not candidate for angiovac  Developed LCV on vanc switched to dapto on 11/01 then linezolid 11/08  (4 weeks iv prior) to finish course  He was discharged home but returned 11/28 with syndrome of right sided overload. tte shows persistent tv vegeations. bcx negative. Rash ?worse. cxr worse cavitationa. By this time he was incarcerated. abx changed to doxy after a dose oritavancin  12/16 id visit He tolerate doxycycline Had another ed visit 12/7 for chest pain but no active issue related to infection No f/c Lower back pain mild stable No diarrhea/n/v, LE weakness No cough Chest pain moderate vague/diffuse; worse with deep breathing  3/02 id f/u Patient negative given doxy when he went to jail after 12/16 id visit Le painful rash recurred, admitted to hospital skin bx LCV. He took doxy thinking cellulitis but no improvement Id saw during admission given steroid. Noted improvement on steroid but still extensive rash No f/c/cp-sob    Allergies  Allergen Reactions  . Vancomycin Rash    Patient on vancomycin for 3 weeks, developed rash on his lower extremities. Did not resemble red man syndrome. Required change of abx      Outpatient Medications Prior to Visit  Medication Sig Dispense Refill  . furosemide (LASIX) 20 MG tablet Take 1 tablet (20 mg total) by mouth daily. 30 tablet 0  . predniSONE (DELTASONE) 10 MG tablet Take 3 tablets (30 mg total) by mouth daily with breakfast for 8 days. 24 tablet 0   No facility-administered medications prior to visit.     Social History   Socioeconomic History  . Marital status: Single  Spouse name: Not on file  . Number of children: Not on file  . Years of education: Not on file  . Highest education level: Not on file  Occupational History  . Not on file  Tobacco Use  . Smoking status: Current Every Day Smoker    Packs/day: 1.00    Years: 10.00    Pack years: 10.00    Types: Cigarettes  . Smokeless tobacco: Never Used  Vaping Use  . Vaping Use: Never used  Substance and Sexual Activity  . Alcohol use: Yes    Alcohol/week:  12.0 standard drinks    Types: 12 Cans of beer per week    Comment: 12 beers daily  . Drug use: Yes    Frequency: 6.0 times per week    Types: Marijuana, Cocaine, Heroin, Fentanyl, IV    Comment: daily use cocaine (snort)  . Sexual activity: Not Currently  Other Topics Concern  . Not on file  Social History Narrative  . Not on file   Social Determinants of Health   Financial Resource Strain: Not on file  Food Insecurity: Not on file  Transportation Needs: Not on file  Physical Activity: Not on file  Stress: Not on file  Social Connections: Not on file  Intimate Partner Violence: Not on file      Review of Systems   negative 11 point ros unless mentioned above  Objective:    Ht 5\' 11"  (1.803 m)   Wt 140 lb (63.5 kg)   BMI 19.53 kg/m  Nursing note and vital signs reviewed.  Physical Exam Accompanied by 2 officers Thin, well appearing, no distress Heent: normocephalic; per; conj clear; eomi Neck supple cv rrr soft systolic murmur; no rub Lungs clear abd s/nt Ext no edema msk mild-mod tenderness bilateral and midline lumbar area Skin hyperpigmentation of punctate 2-3 mm bilateral LE; some eschared, confluent ulceration bilateral distal le. No surrounding erythema Neuro cn2-12 intact, strength/reflex intact Psych alert/oriented       Labs: reviewed  Micro: 04/28/20 bcx neg 12/06 bcx negative 11/28 bcx negative 10/06, 10/10, 10/26 bcx negative 10/04 bcx mrsa  Serology: 12/2019 hiv neg  Pathology: 2/22 skin biopsy FINAL MICROSCOPIC DIAGNOSIS:   A.  SKIN, LEFT LOWER LEG BELOW KNEE, BIOPSY: PERIVASCULAR DERMATITIS  WITH EXTRAVASATED ERYTHROCYTES AND HEMOSIDERIN DEPOSITION   MICROSCOPIC DESCRIPTION: There is a sparse superficial perivascular  lymphocytic infiltrate with scattered extravasated erythrocytes. The  epidermis is intact. Special stains for microorganisms (PAS and Gram)  are negative. An iron stain highlights hemosiderin deposition. There  is  no evidence of vasculitis in the tissue examined. Multiple deeper  sections are reviewed.   COMMENT: The findings are not completely diagnostic but may be seen in  some viral exanthems or morbilliform drug eruptions. In some instances,  drugs that alter platelet function can accentuate the erythrocyte  extravasation.   Imaging: 2/17 cxr Reviewed 1. Interim near complete clearing of bilateral pulmonary infiltrates.  2.  Cardiomegaly.  No pulmonary venous congestion.  12/01 mr lumbar thoracic spine 1. No evidence of discitis-osteomyelitis or epidural abscess. 2. No significant spinal canal or neural foraminal stenosis at any level. 3. Bilateral pleural effusion. Please refer to CT angiogram of the chest performed on February 07, 2020 for additional chest findings.  11/28 chest cta 1. No evidence for acute pulmonary embolus. 2. Multifocal bilateral peripheral predominant areas of nodularity, scarring, and architectural distortion consistent with sequelae of previous septic emboli. Most of the previously noted nodules demonstrate interval improvement from  previous exam. However, there is a new cavitary lung nodule within the posterior right lower lobe measuring 1.9 cm. 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 3. Enlarged right atrium and right ventricle with reflux of contrast material into the IVC and hepatic veins consistent with passive venous congestion due to right heart failure.  11/28 tte 1. Left ventricular ejection fraction, by estimation, is 60 to 65%. The  left ventricle has normal function. The left ventricle has no regional  wall motion abnormalities. There is mild left ventricular hypertrophy.  Left ventricular diastolic parameters  were normal. There is the interventricular septum is flattened in systole  and diastole, consistent with right ventricular pressure and volume  overload.  2. Coarse trabeculation of the RV apex. Right  ventricular systolic  function is mildly reduced. The right ventricular size is severely  enlarged.  3. Right atrial size was severely dilated.  4. The mitral valve is abnormal. Trivial mitral valve regurgitation.  There is mild late systolic prolapse of multiple scallops of the posterior  leaflet of the mitral valve.  5. Mobile vegetations noted on the posterior and anterior TV leaflets  (measures 1.5 x 0.7 cm posterior and up to 2.21 x 0.5 cm on the anterior  leaflet).. The tricuspid valve is abnormal. Tricuspid valve regurgitation  is severe.  6. The aortic valve is tricuspid. Aortic valve regurgitation is not  visualized.  7. The inferior vena cava is dilated in size with <50% respiratory  variability, suggesting right atrial pressure of 15 mmHg.    10/08 mr lumbar thoracic 1. No evidence for osteomyelitis discitis or septic arthritis within the thoracic or lumbar spine. No epidural abscess or other intraspinal infection. 2. Diffuse edema throughout the lower lumbar posterior paraspinous musculature, with similar changes along the posterior margins of the psoas musculature bilaterally. Findings suggestive of acute myositis, likely infectious in nature. No loculated soft tissue collections or abscess. 3. Large irregular bilateral pleural effusions with associated cystic/cavitary changes within the partially visualized lungs, consistent with known history of septic emboli. 4. Shallow left subarticular to foraminal disc protrusion at L5-S1 with resultant mild left foraminal and left lateral recess stenosis.  Assessment & Plan:   Problem List Items Addressed This Visit      Digestive   Chronic hepatitis C without hepatic coma (HCC) - Primary   Relevant Orders   CBC   Comprehensive metabolic panel   C-reactive protein   Cryoglobulin   Hepatitis C genotype   Hepatitis C RNA quantitative ([facility])   Hepatitis B Surface AntiGEN   Hepatitis B Core Antibody, total    Hepatitis B surface antibody,quantitative   US ABDOMEN COMPLETE W/ELASTOGRAPHY    Other Visit Diagnoses    Leukocytoclastic vasculitis (HCC)       Relevant Orders   CBC   Comprehensive metabolic panel   C-reactive protein   Cryoglobulin   Hepatitis C genotype   Hepatitis C RNA quantitative ([facility])   Hepatitis B Surface AntiGEN   Hepatitis B Core Antibody, total   Hepatitis B surface antibody,quantitative   US ABDOMEN COMPLETE W/ELASTOGRAPHY   Ambulatory referral to Dermatology     35 yo male ivdu admitted 12/2019 [place] for tv endocarditis mrsa complicated by lung abscesses, placed on vanc, but developed drug rash switched to dapto -> linezolid, subsequently represented twice with chest pain/right heart failure then chest pain again, and in the interim was started back on doxycycline for lung abscesses   He is currently in jail as of  02/25/2020  Had [facility] and mri with psoas/lumbar paraspinous myositis; no osseous abnormality  He was started on furosemide the 1st representation 02/07/2020 to [place] with controll of right heart failure; at that time started on doxycycline plan 6 weeks until mid 03/2020 due to concern new septic pulmonary emboli on chest cta. Blood cultures repeat negative He [facility] for the 2nd episode of chest pain; discharged back to jail as no active unaddressed issue found  His crp has overall continued to improve An tte 11/28 showed normal lv function, remaining tv vegeations/thrombuses with tr Mri early 02/2020 of lumbar thoracic spine no longer mention myositis changes  His chest pain is due to irritation by the lungs cavitation/abscesses. His LE rash from prior vanc had resolved now with residual pigmentation He still have some back pain lumbar area. Already on treatment. If crp up or worsening back pain, can consider reimaging  3/02 id assessment Seems like rash/lcv not due to  active staph infxn, or drugs Worry hep c related.    -extend steroid trial -cryo and other labs -liver u/s elastography -vicodin for pain -derm referal as well -f/u 2-4 weeks; planned hep c tx  I am having Alex [name] maintain his furosemide and predniSONE.   Follow-up: 2-4 weeks   I spent more than 40 minute reviewing data/chart, and coordinating care and >50% direct face to face time providing counseling/discussing diagnostics/treatment plan with patient      Alex Bandrung T Raquan Iannone, MD [facility] -- -- pager   [phone] cell 05/11/2020, 3:52 PM

## 2020-05-19 ENCOUNTER — Ambulatory Visit (HOSPITAL_COMMUNITY): Admission: RE | Admit: 2020-05-19 | Payer: Self-pay | Source: Ambulatory Visit

## 2020-05-26 ENCOUNTER — Ambulatory Visit (HOSPITAL_COMMUNITY)
Admission: RE | Admit: 2020-05-26 | Discharge: 2020-05-26 | Disposition: A | Discharge status: Home / Self Care | Payer: Self-pay | Source: Ambulatory Visit | Attending: Internal Medicine | Admitting: Internal Medicine

## 2020-05-26 ENCOUNTER — Other Ambulatory Visit: Payer: Self-pay | Admitting: Internal Medicine

## 2020-05-26 ENCOUNTER — Other Ambulatory Visit: Payer: Self-pay

## 2020-05-26 DIAGNOSIS — M310 Hypersensitivity angiitis: Secondary | ICD-10-CM | POA: Insufficient documentation

## 2020-05-26 DIAGNOSIS — I50812 Chronic right heart failure: Secondary | ICD-10-CM

## 2020-05-26 DIAGNOSIS — B182 Chronic viral hepatitis C: Secondary | ICD-10-CM

## 2020-05-26 NOTE — Progress Notes (Signed)
referal for US guided liver biopsy Referral to CHF clinic for management of chf and reduce risk cirrhosis    He appears to have chf as well  Chronic hep c but not anticipating cirrhosis. elastography suggestive of cirrhosis but not fib-4 panel or clinical history   3/17 liver elastography FINDINGS: ULTRASOUND ABDOMEN  Gallbladder: No gallstones or wall thickening visualized. No sonographic Murphy sign noted by sonographer.  Common bile duct: Diameter: 3 mm.  Liver: No focal lesion identified. Coarsened hepatic echotexture with nodular hepatic contour. Portal vein is patent on color Doppler imaging with normal direction of blood flow towards the liver.  IVC: No abnormality visualized.  Pancreas: Visualized portion unremarkable.  Spleen: Size and appearance within normal limits.  Right Kidney: Length: 12.2 cm. Echogenicity within normal limits. No mass or hydronephrosis visualized.  Left Kidney: Length: 11.6 cm. Echogenicity within normal limits. No mass or hydronephrosis visualized.  Abdominal aorta: No aneurysm visualized.  Other findings: None.  ULTRASOUND HEPATIC ELASTOGRAPHY  Device: Siemens Helix VTQ  Patient position: Supine  Transducer 5C1  Number of measurements: 10  Hepatic segment:  8  Median kPa: 16.8  IQR: 2.9  IQR/Median kPa ratio: 0.2  Data quality:  Good  Diagnostic category:  >13 kPa: highly suggestive of cACLD  The use of hepatic elastography is applicable to patients with viral hepatitis and non-alcoholic fatty liver disease. At this time, there is insufficient data for the referenced cut-off values and use in other causes of liver disease, including alcoholic liver disease. Patients, however, may be assessed by elastography and serve as their own reference standard/baseline.  In patients with non-alcoholic liver disease, the values suggesting compensated advanced chronic liver disease (cACLD) may be lower,  and patients may need additional testing with elasticity results of 7-9 KPa.       Echo 04/29/2020 1. Left ventricular ejection fraction, by estimation, is 60 to 65%. The  left ventricle has normal function. The left ventricle has no regional  wall motion abnormalities. Left ventricular diastolic parameters are  indeterminate.  2. Right ventricular systolic function is normal. The right ventricular  size is severely enlarged.  3. Right atrial size was severely dilated.  4. The mitral valve is normal in structure. No evidence of mitral valve  regurgitation. No evidence of mitral stenosis.  5. Tricuspid valve vegetation, mobile mass (1.1 x 0.6 cm). The tricuspid  valve is abnormal. Tricuspid valve regurgitation is torrential.  6. The aortic valve is normal in structure. Aortic valve regurgitation is  not visualized. No aortic stenosis is present.  7. The inferior vena cava is dilated in size with <50% respiratory  variability, suggesting right atrial pressure of 15 mmHg.

## 2020-05-27 ENCOUNTER — Telehealth: Payer: Self-pay

## 2020-05-27 NOTE — Telephone Encounter (Signed)
-----   Message from Raymondo Band, MD sent at 05/26/2020  4:32 PM EDT ----- Hi team  This patient has chronic hep c, I am planning on treating. He has elastography showing advance fibrosis but I think it is due to his right heart failure.  Please let him know: 1) I have ordered a liver biopsy via u/s guided by IR to confirm or exclude fibrosis (as it will guide treatment of hep c) 2) I have also referred him to chf clinic to evaluate/manage chf   Please reschedule him to after the liver biopsy to follow up with me  thanks  Thank you

## 2020-05-27 NOTE — Telephone Encounter (Signed)
Unable to connect with patient. Will follow up with Mychart message.   Dalphine Cowie Loyola Mast, RN

## 2020-05-27 NOTE — Telephone Encounter (Signed)
Attempted to contact patient to relay test results and continued work up plan. Unable to leave voicemail for patient. Will forward to triage for continued follow up.   Braelee Herrle Loyola Mast, RN

## 2020-05-30 ENCOUNTER — Telehealth (HOSPITAL_COMMUNITY): Payer: Self-pay

## 2020-05-31 ENCOUNTER — Telehealth (HOSPITAL_COMMUNITY): Payer: Self-pay

## 2020-05-31 NOTE — Telephone Encounter (Signed)
Cerri from [facility] called to notify office that they've multiple attempts to reach patient to schedule procedure; unable to reach patient. Left voicemails on parents phones as well. If patient does call [facility] back, she gave number for him to call and schedule.   [phone] and ask for Cerri or Tasha.   Caiden Arteaga Loyola Mast, RN

## 2020-06-01 NOTE — Progress Notes (Signed)
Patient ID: Alex [name], male   DOB: Mar 17, 1985, 35 y.o.   MRN: [id number]      Alex [name], is a 35 y.o. male  NLZ:[id number]  TKW:[id number]  DOB - [birth year]  Subjective:  Chief Complaint and HPI: Alex [name] is a 35 y.o. male here today to establish care and for a follow up visit after hospitalization with vasculitis and infective endocarditis.  Has already seen Dr Renold Don post hospitalization and has an appt scheduled 4/1 for Dr Renold Don and labs.    His only concern today is that of recurrent vasculitis.  He finished the prednisone 2 days ago and legs are looking better but he says the swelling always returns and it worsens.  Here to establish care today.  Says he is clean and sober now but not actively involved in recovery.   Admit date: 04/28/2020 Discharge date: 05/04/2020  Admitted From: Home Disposition:  Home  Recommendations for Outpatient Follow-up:  1. Follow up with PCP in 1 week 2. Follow up with Dr. Renold Don, Infectious Disease, as scheduled on 05/11/20 3. Continue prednisone 30 mg daily, end date 05/13/2020 4. 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   Patient with known tricuspid valve MRSA endocarditis with multiple recent admissions. Most recently admitted 02/07/2020-02/12/2020 at which time he was found to have pulmonary septic emboli. He was initially treated with IV vancomycin however developed a suspected drug rash and antibiotic with switched to daptomycin. He received a dose of IV oritavancin on 02/11/2020 and was discharged on a  6-week course of oral doxycycline(patient did not fill until 04/16/2020). During that hospitalization blood cultures remained negative. TTE 02/07/2020 again showed tricuspid valve endocarditis with severe TR. CT surgery recommended conservative nonsurgical management.   He reports snorting opiates several days ago. He denies any injection drug use over the last couple months. He denies any recent cocaine or alcohol use. He smokes half pack of cigarettes per day. He denies using any recreational benzodiazepines.  Initial vitals stable,Urinalysis negative for UTI. COVID-19 negative.UDS positive for THC. Portable chest x-ray shows significant improvement of previous bilateral pulmonary infiltrates.  Patient was given IV Lasix 20 mg once and OxyIR 5 mg. EDP discussed with on-call infectious disease who recommended medical admissionand hold antibiotics.EDP also discussed with on-call CT surgery who recommended repeat echocardiogram and vascular surgery who felt there was no vascular surgery needed at this time. He was admitted to hospital service. Blood culture was negative.  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   Patient underwent skin biopsy on 2/22.  He is discharged home on oral prednisone.  He is to follow-up outpatient with infectious disease.  Discharge Diagnoses:  Principal Problem:   Vasculitis (HCC) Active Problems:   Endocarditis of tricuspid valve   Chronic hepatitis C without hepatic coma (HCC)   Substance use disorder   ED/Hospital notes reviewed and summarized above.     ROS:   Constitutional:  No f/c, No night sweats, No unexplained weight loss. EENT:  No vision changes, No blurry vision, No hearing changes. No mouth, throat, or ear problems.  Respiratory: No cough, No SOB Cardiac:  No CP, no palpitations GI:  No abd pain, No N/V/D. GU: No Urinary s/sx Musculoskeletal: No joint pain Neuro: No  headache, no dizziness, no motor weakness.  Skin: No rash Endocrine:  No polydipsia. No polyuria.  Psych: Denies SI/HI  No problems updated.  ALLERGIES: Allergies  Allergen Reactions  . Vancomycin Rash    Patient on vancomycin for 3 weeks, developed rash on his lower extremities. Did not resemble red man syndrome. Required change of abx    PAST MEDICAL HISTORY: Past Medical History:  Diagnosis Date  . ADHD (attention deficit hyperactivity disorder)   . Allergy   . Asthma   . CHF (congestive heart failure) (HCC)   . Depressed   . 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 ETOH abuse   . History of suicidal tendencies   . Polysubstance abuse (HCC)     MEDICATIONS AT HOME: Prior to Admission medications   Medication Sig Start Date End Date Taking? Authorizing Provider  furosemide (LASIX) 20 MG tablet Take 1 tablet (20 mg total) by mouth daily. Prn swelling 06/02/20   Anders Simmonds, PA-C  albuterol (PROVENTIL HFA) 108 (90 Base) MCG/ACT inhaler Inhale 1-2 puffs into the lungs every 4 (four) hours as needed for wheezing or shortness of breath. Patient not taking: Reported on 02/25/2020 09/08/18 04/28/20  Wieters, Hallie C, PA-C  ipratropium-albuterol (DUONEB) 0.5-2.5 (3) MG/3ML SOLN Take 3 mLs by nebulization every 6 (six) hours as needed (wheezing). Patient not taking: Reported on 12/28/2018 04/14/18 12/28/18  Elpidio Anis, PA-C  montelukast (SINGULAIR) 10 MG tablet Take 1 tablet (10 mg total) by mouth at bedtime. Patient not taking: Reported on 12/28/2018 07/25/17 12/28/18  Georgetta Haber, NP  traZODone (DESYREL) 50 MG tablet Take 1 tablet (50 mg total) by mouth at bedtime and may repeat dose one time if needed. Patient not taking: Reported on 07/05/2018 04/29/16 12/28/18  Oneta Rack, NP     Objective:  EXAM:   Vitals:   06/02/20 1033  BP: 95/65  Pulse: 65  SpO2: 95%  Weight: 140 lb 3.2 oz (63.6 kg)    General appearance : A&OX3. NAD. Non-toxic-appearing; very  thin; appears to e in poor health HEENT: Atraumatic and Normocephalic.  PERRLA. EOM intact.  Chest/Lungs:  Breathing-non-labored, Good air entry bilaterally, breath sounds normal without rales, rhonchi, or wheezing  CVS: S1 S2 regular, no murmurs, gallops, rubs  Extremities: Bilateral Lower Ext shows no edema, both legs are warm to touch with = pulse throughout Neurology:  CN II-XII grossly intact, Non focal.   Psych:  TP linear. J/I WNL. Normal speech. Appropriate eye contact and affect.  Skin:  No Rash  Data Review Lab Results  Component Value Date   HGBA1C 6.1 (H) 01/01/2019     Assessment & Plan   1. Vasculitis (HCC) To offset swelling if recurs and may be able to decrease UC/ED visit.  Use prn - furosemide (LASIX) 20 MG tablet; Take 1 tablet (20 mg total) by mouth daily. Prn swelling  Dispense: 30 tablet; Refill: 1  2. Acute bacterial endocarditis Resolved.  No SOB/CP  3. Polysubstance dependence (HCC) [place].org is the website for narcotics anonymous [employer].cy (website) or 301-887-2090 is the information for alcoholics anonymous Both are free and immediately available for help with alcohol and drug use I have counseled the patient at length about substance abuse and addiction.  12 step meetings/recovery recommended.  Local 12 step meeting lists were given and attendance was encouraged.  Patient expresses understanding.  4. Chronic hepatitis C without hepatic coma (HCC) Dr Renold Don to follow.  Has appt 06/10/2020  5. Hospital discharge follow-up Doing well overall  6. Edema, unspecified type See #1 - furosemide (LASIX) 20 MG tablet; Take 1 tablet (20 mg total) by mouth daily. Prn swelling  Dispense: 30 tablet; Refill: 1  Patient have been counseled extensively about nutrition and exercise  Return in about 4 months (around 10/02/2020) for assign PCP.  The patient was given clear instructions to go to ER or return to medical center if symptoms don't improve, worsen or  new problems develop. The patient verbalized understanding. The patient was told to call to get lab results if they haven't heard anything in the next week.     Georgian Co, PA-C [facility] [place], [place] [phone]   06/02/2020, 10:48 AM

## 2020-06-02 ENCOUNTER — Encounter: Payer: Self-pay | Admitting: Physician Assistant

## 2020-06-02 ENCOUNTER — Ambulatory Visit: Payer: Self-pay | Attending: Physician Assistant | Admitting: Physician Assistant

## 2020-06-02 ENCOUNTER — Other Ambulatory Visit: Payer: Self-pay

## 2020-06-02 VITALS — BP 95/65 | HR 65 | Wt 140.2 lb

## 2020-06-02 DIAGNOSIS — R609 Edema, unspecified: Secondary | ICD-10-CM

## 2020-06-02 DIAGNOSIS — F1920 Other psychoactive substance dependence, uncomplicated: Secondary | ICD-10-CM

## 2020-06-02 DIAGNOSIS — I330 Acute and subacute infective endocarditis: Secondary | ICD-10-CM

## 2020-06-02 DIAGNOSIS — B182 Chronic viral hepatitis C: Secondary | ICD-10-CM

## 2020-06-02 DIAGNOSIS — Z09 Encounter for follow-up examination after completed treatment for conditions other than malignant neoplasm: Secondary | ICD-10-CM

## 2020-06-02 DIAGNOSIS — I776 Arteritis, unspecified: Secondary | ICD-10-CM

## 2020-06-02 MED ORDER — FUROSEMIDE 20 MG PO TABS: 20.0000 mg | ORAL_TABLET | Freq: Every day | ORAL | 1 refills | Status: AC

## 2020-06-02 MED ORDER — FUROSEMIDE 20 MG PO TABS: 20.0000 mg | ORAL_TABLET | Freq: Every day | ORAL | 1 refills | Status: DC

## 2020-06-02 NOTE — Patient Instructions (Signed)
[place]org is the website for narcotics anonymous [employer] (website) or 551-059-0943 is the information for alcoholics anonymous Both are free and immediately available for help with alcohol and drug use   Vasculitis  Vasculitis is inflammation of the blood vessels. With vasculitis, the blood vessels can become thick, narrow, scarred, or weak. Enough blood may not be able to flow through them. This can cause damage to the muscles, kidneys, lungs, brain, and other parts of the body. There are many types of vasculitis. The different types may affect different kinds of blood vessels or different areas of the body. Some types last only a short time, while others last a long time. What are the causes? The exact cause of this condition is not known. However, vasculitis can develop when the body's defense system (immune system) attacks its own blood vessels. This attack can be caused by:  An infection.  An immune system disease, such as lupus, rheumatoid arthritis, or scleroderma.  An allergic reaction to a medicine.  A cancer that affects blood cells, such as leukemia or lymphoma. What increases the risk? The following factors may make you more likely to develop this condition:  Being a smoker.  Being under stress.  Having a physical injury. What are the signs or symptoms? Symptoms of this condition depend on the type of vasculitis that you have. Symptoms that are common to all types of vasculitis include:  Fever.  Poor appetite.  Weight loss.  Feeling very tired (fatigue).  Having aches and pains.  Weakness.  Numbness in an area of your body. Symptoms for specific types of vasculitis include:  Skin problems, such as sores, spots, or rashes.  Trouble seeing.  Trouble breathing.  Coughing up blood.  Blood in your urine.  Headaches.  Stomach pain.  Stuffy or bloody nose. How is this diagnosed? This condition may be diagnosed based on:  Your symptoms.  A  physical exam. You may also have tests, including:  Blood tests.  A urine test.  A biopsy of a blood vessel.  A test to measure the electrical signals moving through nerves (nerve conduction study).  Imaging tests, such as: ? X-rays. ? CT scan. ? Ultrasound. ? MRI. ? Angiogram. How is this treated? Treatment for this condition will depend on the type of vasculitis that you have and how severe the symptoms are. Sometimes treatment is not needed. Treatment often includes:  Medicines.  Physical therapy or occupational therapy. This helps strengthen muscles that were weakened by the disease. You will need to see your health care provider while you are being treated. During follow-up visits, your health care provider may:  Perform blood tests and bone density tests.  Check your blood pressure and blood sugar.  Check for side effects of any medicines you are taking. Vasculitis cannot always be cured. Sometimes symptoms go away but the disease does not (the disease goes into remission). If symptoms return, increased treatment may be needed.   Follow these instructions at home:  Take over-the-counter and prescription medicines only as told by your health care provider.  Exercise as directed. Talk with your health care provider about what exercises are okay for you to do. Exercises that increase your heart rate (aerobic exercise), such as walking, are usually recommended. Aerobic exercise helps control your blood pressure and prevent bone loss.  Follow a healthy diet. Make sure your diet includes fruits, vegetables, whole grains, and healthy sources of protein.  Learn as much as you can about vasculitis, and consider  joining a support group. ? Talk to other people who have your condition. This may help you cope with the illness. ? Talk with your health care provider if you feel stressed, anxious, or depressed.  Keep all follow-up visits as told by your health care provider. This is  important. Contact a health care provider if:  Your symptoms return or you have new symptoms.  Your fever, fatigue, headache, or weight loss gets worse.  You have signs of infection, such as redness, swelling, tenderness, warmth, or a new fever.  Your pain does not go away, even after you take pain medicine.  Your nose bleeds. Get help right away if:  Your vision gets worse.  You have chest pain or stomach pain.  You have trouble breathing.  One side of your face or body suddenly becomes weak or numb.  There is blood in your urine. Summary  Vasculitis is inflammation of the blood vessels that may cause them to become thick, narrow, scarred, or weak. Enough blood may not be able to flow through them. This can cause damage throughout your body.  The exact cause of this condition is not known. However, vasculitis can develop when the body's immune system attacks its own blood vessels. This attack may be caused by an infection, an immune system disease, an allergic reaction to a medicine, or a cancer that affects blood cells, such as leukemia or lymphoma.  Vasculitis cannot always be cured. Sometimes symptoms go away but the disease does not (the disease goes into remission). If symptoms return, increased treatment may be needed. This information is not intended to replace advice given to you by your health care provider. Make sure you discuss any questions you have with your health care provider. Document Revised: 03/29/2017 Document Reviewed: 03/19/2017 Elsevier Patient Education  2021 [employer].

## 2020-06-02 NOTE — Progress Notes (Signed)
HFU 

## 2020-06-10 ENCOUNTER — Ambulatory Visit: Payer: Self-pay | Admitting: Internal Medicine

## 2020-06-15 ENCOUNTER — Telehealth: Payer: Self-pay

## 2020-06-15 NOTE — Telephone Encounter (Signed)
RN spoke to [facility] at [facility] and instructed her to void the Vicodin prescription per Dr. Renold Don. Heidi verbalized understanding. RN requested that they please ask patient to call our office for follow up. Heidi was able to provide a number for patient that is not listed in the chart. Phone number is [phone]. RN attempted to call, no answer.  Voicemail was identified as a family mailbox, RN did not leave message.   Sandie Ano, RN

## 2020-06-15 NOTE — Telephone Encounter (Signed)
Received call from [facility]. They state patient dropped off hand-written prescription for Vicodin 5mg -325mg  1 tablet every 6 hours as needed for pain, dispense 15 tablets with 0 refills. Pharmacy is wanting to confirm prescription as it was dated 05/11/20 and patient is presenting it for fill over 1 month later. Vicodin is not listed on patient's medication list. Will route to provider.   , RN

## 2020-07-18 ENCOUNTER — Telehealth (HOSPITAL_COMMUNITY): Payer: Self-pay

## 2020-07-30 ENCOUNTER — Emergency Department (HOSPITAL_COMMUNITY)
Admission: EM | Admit: 2020-07-30 | Discharge: 2020-07-30 | Disposition: A | Discharge status: Home / Self Care | Payer: Self-pay | Attending: Emergency Medicine | Admitting: Emergency Medicine

## 2020-07-30 ENCOUNTER — Other Ambulatory Visit: Payer: Self-pay

## 2020-07-30 DIAGNOSIS — F17210 Nicotine dependence, cigarettes, uncomplicated: Secondary | ICD-10-CM | POA: Insufficient documentation

## 2020-07-30 DIAGNOSIS — R17 Unspecified jaundice: Secondary | ICD-10-CM | POA: Insufficient documentation

## 2020-07-30 DIAGNOSIS — R110 Nausea: Secondary | ICD-10-CM | POA: Insufficient documentation

## 2020-07-30 DIAGNOSIS — I509 Heart failure, unspecified: Secondary | ICD-10-CM | POA: Insufficient documentation

## 2020-07-30 DIAGNOSIS — R7401 Elevation of levels of liver transaminase levels: Secondary | ICD-10-CM | POA: Insufficient documentation

## 2020-07-30 DIAGNOSIS — J45909 Unspecified asthma, uncomplicated: Secondary | ICD-10-CM | POA: Insufficient documentation

## 2020-07-30 LAB — CBC WITH DIFFERENTIAL/PLATELET
Abs Immature Granulocytes: 0.02 10*3/uL (ref 0.00–0.07)
Basophils Absolute: 0 10*3/uL (ref 0.0–0.1)
Basophils Relative: 1 %
Eosinophils Absolute: 0.2 10*3/uL (ref 0.0–0.5)
Eosinophils Relative: 2 %
HCT: 38.3 % — ABNORMAL LOW (ref 39.0–52.0)
Hemoglobin: 13.1 g/dL (ref 13.0–17.0)
Immature Granulocytes: 0 %
Lymphocytes Relative: 26 %
Lymphs Abs: 2.2 10*3/uL (ref 0.7–4.0)
MCH: 30.1 pg (ref 26.0–34.0)
MCHC: 34.2 g/dL (ref 30.0–36.0)
MCV: 88 fL (ref 80.0–100.0)
Monocytes Absolute: 0.9 10*3/uL (ref 0.1–1.0)
Monocytes Relative: 10 %
Neutro Abs: 5.2 10*3/uL (ref 1.7–7.7)
Neutrophils Relative %: 61 %
Platelets: 193 10*3/uL (ref 150–400)
RBC: 4.35 MIL/uL (ref 4.22–5.81)
RDW: 18.1 % — ABNORMAL HIGH (ref 11.5–15.5)
WBC: 8.6 10*3/uL (ref 4.0–10.5)
nRBC: 0 % (ref 0.0–0.2)

## 2020-07-30 LAB — HEPATIC FUNCTION PANEL
ALT: 585 U/L — ABNORMAL HIGH (ref 0–44)
AST: 752 U/L — ABNORMAL HIGH (ref 15–41)
Albumin: 3.5 g/dL (ref 3.5–5.0)
Alkaline Phosphatase: 158 U/L — ABNORMAL HIGH (ref 38–126)
Bilirubin, Direct: 4.9 mg/dL — ABNORMAL HIGH (ref 0.0–0.2)
Indirect Bilirubin: 3.3 mg/dL — ABNORMAL HIGH (ref 0.3–0.9)
Total Bilirubin: 8.2 mg/dL — ABNORMAL HIGH (ref 0.3–1.2)
Total Protein: 7.7 g/dL (ref 6.5–8.1)

## 2020-07-30 LAB — BASIC METABOLIC PANEL
Anion gap: 7 (ref 5–15)
BUN: 13 mg/dL (ref 6–20)
CO2: 22 mmol/L (ref 22–32)
Calcium: 9 mg/dL (ref 8.9–10.3)
Chloride: 106 mmol/L (ref 98–111)
Creatinine, Ser: 0.74 mg/dL (ref 0.61–1.24)
GFR, Estimated: >60 mL/min (ref >60)
Glucose, Bld: 93 mg/dL (ref 70–99)
Potassium: 4.2 mmol/L (ref 3.5–5.1)
Sodium: 135 mmol/L (ref 135–145)

## 2020-07-30 NOTE — ED Triage Notes (Signed)
Patient reports he is jaundiced x1 week. Denies pain. Says he was diagnosed with hep C about 9 months ago. Reports vomiting as well.

## 2020-07-30 NOTE — ED Provider Notes (Signed)
[facility]-EMERGENCY DEPT Provider Note   CSN: [id number] Arrival date & time: 07/30/20  [id number]     History Chief Complaint  Patient presents with  . Jaundice    Alex [name] is a 35 y.o. male.  HPI Patient presents for evaluation because his eyes are yellow.  He states his father noticed it.  He has had some nausea lately but has been tolerating his usual diet.  He states that he has "untreated hepatitis C."  He states he is not sure why it has not been treated.  He was recently evaluated in the ED, with imaging, laboratory testing, and has not followed up with gastroenterology since.  He sees an ID doctor occasional because of prior diagnosis of endocarditis.  He is not currently taking any medicines.  He denies current use of alcohol or illegal drugs.  He denies fever, chills, cough, shortness of breath, weakness or dizziness.  There are no other known active modifying factors.    Past Medical History:  Diagnosis Date  . ADHD (attention deficit hyperactivity disorder)   . Allergy   . Asthma   . CHF (congestive heart failure) (HCC)   . Depressed   . 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 ETOH abuse   . History of suicidal tendencies   . Polysubstance abuse [facility](HCC)     Patient Active Problem List   Diagnosis Date Noted  . Substance use disorder 04/28/2020  . Nonspecific chest pain   . Septic embolism (HCC) 02/07/2020  . Multifocal pneumonia   . MRSA bacteremia   . Vasculitis (HCC)   . Malnutrition of moderate degree 12/25/2019  . Pleural effusion, bilateral   . Chronic hepatitis C without hepatic coma (HCC)   . Infective myositis   . Acute septic pulmonary embolism (HCC)   . Goals of care, counseling/discussion   . Palliative care encounter   . Endocarditis 12/14/2019  . Acute CHF (congestive heart failure) (HCC) 12/14/2019  . Anemia 12/14/2019  . Agitation 08/19/2019  . Endocarditis of tricuspid valve   . IVDU (intravenous drug user)   .  Sepsis (HCC) 12/28/2018  . Alcohol use disorder, severe, dependence (HCC) 09/11/2018  . Substance induced mood disorder (HCC) 04/24/2016  . Polysubstance dependence (HCC) 04/23/2016  . Major depressive disorder, single episode, severe without psychotic features (HCC) 04/23/2016  . 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 Tobacco use disorder 02/01/2014  . ADHD (attention deficit hyperactivity disorder)     Past Surgical History:  Procedure Laterality Date  . RADIOLOGY WITH ANESTHESIA N/A 12/18/2019   Procedure: MRI-THORACIC MRI WITH AND WITHOUT CONSTRAST;  Surgeon: Radiologist, Medication, MD;  Location: [facility] OR;  Service: Radiology;  Laterality: N/A;  . RADIOLOGY WITH ANESTHESIA N/A 01/14/2020   Procedure: MRI WITH ANESTHESIA OF THORACIC SPINE AND LUMBAR SPINE;  Surgeon: Radiologist, Medication, MD;  Location: [facility] OR;  Service: Radiology;  Laterality: N/A;  . RADIOLOGY WITH ANESTHESIA N/A 02/10/2020   Procedure: MRI WITH ANESTHESIA OF THORACIC SPINE WITH AND WIHTOUT AND LUMBAR WITH AND WITHOUT CONTRAST;  Surgeon: Radiologist, Medication, MD;  Location: [facility] OR;  Service: Radiology;  Laterality: N/A;  . TEE WITHOUT CARDIOVERSION N/A 12/31/2018   Procedure: TRANSESOPHAGEAL ECHOCARDIOGRAM (TEE);  Surgeon: Thurmon Fairroitoru, Mihai, MD;  [facility];  Service: Cardiovascular;  Laterality: N/A;       Family History  Problem Relation Age of Onset  . COPD Other     Social History   Tobacco Use  .  Smoking status: Current Every Day Smoker    Packs/day: 1.00    Years: 10.00    Pack years: 10.00    Types: Cigarettes  . Smokeless tobacco: Never Used  Vaping Use  . Vaping Use: Never used  Substance Use Topics  . Alcohol use: Yes    Alcohol/week: 12.0 standard drinks    Types: 12 Cans of beer per week    Comment: 12 beers daily  . Drug use: Yes    Frequency: 6.0 times per week    Types: Marijuana, Cocaine, Heroin, Fentanyl, IV    Comment:  daily use cocaine (snort)    Home Medications Prior to Admission medications   Medication Sig Start Date End Date Taking? Authorizing Provider  furosemide (LASIX) 20 MG tablet Take 1 tablet (20 mg total) by mouth daily. Prn swelling Patient not taking: Reported on 07/30/2020 06/02/20   Anders Simmonds, PA-C  albuterol (PROVENTIL HFA) 108 (90 Base) MCG/ACT inhaler Inhale 1-2 puffs into the lungs every 4 (four) hours as needed for wheezing or shortness of breath. Patient not taking: Reported on 02/25/2020 09/08/18 04/28/20  Wieters, Hallie C, PA-C  ipratropium-albuterol (DUONEB) 0.5-2.5 (3) MG/3ML SOLN Take 3 mLs by nebulization every 6 (six) hours as needed (wheezing). Patient not taking: Reported on 12/28/2018 04/14/18 12/28/18  Elpidio Anis, PA-C  montelukast (SINGULAIR) 10 MG tablet Take 1 tablet (10 mg total) by mouth at bedtime. Patient not taking: Reported on 12/28/2018 07/25/17 12/28/18  Linus Mako B, NP  spironolactone (ALDACTONE) 25 MG tablet Take 0.5 tablets (12.5 mg total) by mouth daily. 01/19/20 02/12/20  Arrien, York Ram, MD  traZODone (DESYREL) 50 MG tablet Take 1 tablet (50 mg total) by mouth at bedtime and may repeat dose one time if needed. Patient not taking: Reported on 07/05/2018 04/29/16 12/28/18  Oneta Rack, NP    Allergies    Vancomycin  Review of Systems   Review of Systems  All other systems reviewed and are negative.   Physical Exam Updated Vital Signs BP 104/73   Pulse 62   Temp 98.6 F (37 C) (Oral)   Resp 16   Ht 6' (1.829 m)   Wt 68 kg   SpO2 96%   BMI 20.34 kg/m   Physical Exam Vitals and nursing note reviewed.  Constitutional:      General: He is not in acute distress.    Appearance: He is well-developed. He is not ill-appearing, toxic-appearing or diaphoretic.  HENT:     Head: Normocephalic and atraumatic.     Right Ear: External ear normal.     Left Ear: External ear normal.  Eyes:     Conjunctiva/sclera: Conjunctivae  normal.     Pupils: Pupils are equal, round, and reactive to light.  Neck:     Trachea: Phonation normal.  Cardiovascular:     Rate and Rhythm: Normal rate.  Pulmonary:     Effort: Pulmonary effort is normal.  Abdominal:     General: There is no distension.  Musculoskeletal:        General: Normal range of motion.     Cervical back: Normal range of motion and neck supple.  Skin:    General: Skin is warm and dry.  Neurological:     Mental Status: He is alert and oriented to person, place, and time.     Cranial Nerves: No cranial nerve deficit.     Sensory: No sensory deficit.     Motor: No abnormal muscle tone.  Coordination: Coordination normal.  Psychiatric:        Mood and Affect: Mood normal.        Behavior: Behavior normal.        Thought Content: Thought content normal.        Judgment: Judgment normal.     ED Results / Procedures / Treatments   Labs (all labs ordered are listed, but only abnormal results are displayed) Labs Reviewed  CBC WITH DIFFERENTIAL/PLATELET - Abnormal; Notable for the following components:      Result Value   HCT 38.3 (*)    RDW 18.1 (*)    All other components within normal limits  HEPATIC FUNCTION PANEL - Abnormal; Notable for the following components:   AST 752 (*)    ALT 585 (*)    Alkaline Phosphatase 158 (*)    Total Bilirubin 8.2 (*)    Bilirubin, Direct 4.9 (*)    Indirect Bilirubin 3.3 (*)    All other components within normal limits  BASIC METABOLIC PANEL    EKG None  Radiology No results found.  Procedures Procedures   Medications Ordered in ED Medications - No data to display  ED Course  I have reviewed the triage vital signs and the nursing notes.  Pertinent labs & imaging results that were available during my care of the patient were reviewed by me and considered in my medical decision making (see chart for details).  Clinical Course as of 07/30/20 1117  Sat Jul 30, 2020  1114 I discussed case with  infectious disease physician, Dr. Luciana Axe, who is comfortable with patient following up in the infectious disease clinic later this week with his primary provider there.  We also discussed need for referral to GI for further evaluation and their input. [EW]    Clinical Course User Index [EW] Mancel Bale, MD   MDM Rules/Calculators/A&P                           Patient Vitals for the past 24 hrs:  BP Temp Temp src Pulse Resp SpO2 Height Weight  07/30/20 1030 104/73 -- -- 62 16 96 % -- --  07/30/20 0947 96/72 -- -- 61 17 97 % -- --  07/30/20 0845 113/75 -- -- (!) 54 16 96 % -- --  07/30/20 0819 96/63 -- -- (!) 53 18 94 % -- --  07/30/20 0713 109/79 98.6 F (37 C) Oral 79 18 100 % -- --  07/30/20 0711 -- -- -- -- -- -- 6' (1.829 m) 68 kg    11:17 AM Reevaluation with update and discussion. After initial assessment and treatment, an updated evaluation reveals he is comfortable has no further complaints, findings discussed and questions answered. Mancel Bale   Medical Decision Making:  This patient is presenting for evaluation of yellow eyes, which does require a range of treatment options, and is a complaint that involves a moderate risk of morbidity and mortality. The differential diagnoses include enteritis, hepatitis, viral illness. I decided to review old records, and in summary it was male presenting with recurrent symptoms of jaundice.  I do not require additional historical information from anyone.  Clinical Laboratory Tests Ordered, included CBC, Metabolic panel and Hepatic panel. Review indicates normal electrolytes.  Transaminitis present, total bilirubin high, indirect bilirubin high, direct bilirubin high.   Critical Interventions-clinical evaluation, laboratory testing, observation reassessment  After These Interventions, the Patient was reevaluated and was found stable for discharge.  Patient with increasing bilirubin and transaminases, with mild nausea.  He is nontoxic  and does not require hospitalization at this time.  He has not previously followed up with GI for hepatitis C problems.  He was last evaluated for multiple medical problems and occluding hepatitis C, by infectious disease, May 11, 2020.  He was recommended to follow-up in 2 to 4 weeks but has not done that.  He had labs ordered then which were not done he did have ultrasound with elastography showing advanced fibrosis, of the liver, and was recommended to have IR do a liver biopsy but did not follow-up with radiology to schedule that.  The patient is at risk of complications from this level of disease.  Per ID doctor, patient has history of right heart failure that may be causing the fibrosis.  Recent cardiac echo February 2022 showed dilated right atrium and right ventricle.  Normal systolic function at that time.  Patient not presenting with signs of acute right heart failure at this time.  Following evaluation patient is comfortable and stable for discharge with outpatient follow-up and treatment.  He has new referral given for GI and referred back to the ID clinic to resume previously planned care plan.  CRITICAL CARE-no Performed by: Mancel Bale  Nursing Notes Reviewed/ Care Coordinated Applicable Imaging Reviewed Interpretation of Laboratory Data incorporated into ED treatment  The patient appears reasonably screened and/or stabilized for discharge and I doubt any other medical condition or other [facility] requiring further screening, evaluation, or treatment in the ED at this time prior to discharge.  Plan: Home Medications-no new medications; Home Treatments-continue to avoid illegal substances and alcohol; return here if the recommended treatment, does not improve the symptoms; Recommended follow up-GI and ID follow-up ASAP     Final Clinical Impression(s) / ED Diagnoses Final diagnoses:  Jaundice  Transaminitis  Hyperbilirubinemia    Rx / DC Orders ED Discharge Orders    None        Mancel Bale, MD 07/30/20 1117

## 2020-07-30 NOTE — Discharge Instructions (Addendum)
Call the infectious disease clinic, as well as the gastroenterology office for follow-up appointment to be seen as soon as possible.  They will be able to help you with your problems, with the liver.

## 2020-08-15 ENCOUNTER — Ambulatory Visit (INDEPENDENT_AMBULATORY_CARE_PROVIDER_SITE_OTHER): Payer: Self-pay | Admitting: Internal Medicine

## 2020-08-15 ENCOUNTER — Encounter: Payer: Self-pay | Admitting: Internal Medicine

## 2020-08-15 ENCOUNTER — Other Ambulatory Visit: Payer: Self-pay

## 2020-08-15 VITALS — BP 107/72 | HR 70 | Wt 132.0 lb

## 2020-08-15 DIAGNOSIS — R7401 Elevation of levels of liver transaminase levels: Secondary | ICD-10-CM

## 2020-08-15 DIAGNOSIS — Z8614 Personal history of Methicillin resistant Staphylococcus aureus infection: Secondary | ICD-10-CM

## 2020-08-15 DIAGNOSIS — I50812 Chronic right heart failure: Secondary | ICD-10-CM

## 2020-08-15 DIAGNOSIS — B182 Chronic viral hepatitis C: Secondary | ICD-10-CM

## 2020-08-15 MED ORDER — CHOLESTYRAMINE 4 GM/DOSE PO POWD: 4.0000 g | Freq: Two times a day (BID) | ORAL | 3 refills | Status: AC

## 2020-08-15 NOTE — Progress Notes (Addendum)
Patient passed out during lab collection after visit with Dr Renold Don.Marland Kitchen He became diaphoretic, unresponsive, and slumped over the cross bar in the lab chair. He was held in place by phlebotomist Tenny Craw. Patient still breathing, but was passed out for 1-2 minutes before regaining consciousness.  He was quickly reoriented to the doctor's office, and then was able to orient to self and day. Patient became nauseated with emesis. Supportive care provided, vitals collected as documented beginning at 3:07 pm. He was brought to a patient room where he could lie down and recover. Patient fully recovered by 3:45 pm,cleared by Dr Renold Don, and assisted to the car where his father was waiting to drive him home. RN relayed the events to patient's father. Andree Coss, RN

## 2020-08-15 NOTE — Progress Notes (Signed)
[facility]  Patient Active Problem List   Diagnosis Date Noted  . Substance use disorder 04/28/2020  . Nonspecific chest pain   . Septic embolism (HCC) 02/07/2020  . Multifocal pneumonia   . MRSA bacteremia   . Vasculitis (HCC)   . Malnutrition of moderate degree 12/25/2019  . Pleural effusion, bilateral   . Chronic hepatitis C without hepatic coma (HCC)   . Infective myositis   . Acute septic pulmonary embolism (HCC)   . Goals of care, counseling/discussion   . Palliative care encounter   . Endocarditis 12/14/2019  . Acute CHF (congestive heart failure) (HCC) 12/14/2019  . Anemia 12/14/2019  . Agitation 08/19/2019  . Endocarditis of tricuspid valve   . IVDU (intravenous drug user)   . Sepsis (HCC) 12/28/2018  . Alcohol use disorder, severe, dependence (HCC) 09/11/2018  . Substance induced mood disorder (HCC) 04/24/2016  . Polysubstance dependence (HCC) 04/23/2016  . Major depressive disorder, single episode, severe without psychotic features (HCC) 04/23/2016  . 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 Tobacco use disorder 02/01/2014  . ADHD (attention deficit hyperactivity disorder)       Subjective:    Patient ID: Alex [name], male    DOB: 01/10/1986, 35 y.o.   MRN: [id number]  No chief complaint on file.   HPI:  Alex [name] is a 35 y.o. 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 tte shows persistent tv vegeations. bcx negative. Rash ?worse. cxr worse cavitationa. By this time he was incarcerated. abx changed to doxy after a dose oritavancin  12/16 id visit He tolerate doxycycline Had another ed visit 12/7 for chest pain but no active issue related to infection No f/c Lower back pain mild stable No diarrhea/n/v, LE weakness No cough Chest pain moderate vague/diffuse; worse with deep breathing  3/02 id f/u Patient negative given doxy when he went to jail after 12/16 id visit Le painful rash recurred, admitted to hospital skin bx LCV. He took doxy thinking cellulitis but no improvement Id saw during admission given steroid. Noted improvement on steroid but still extensive rash No f/c/cp-sob  6/06 id f/u Not f/u with cards/ct surgery yet Rash had resolved No f/c No cough or chest pain Still very fatigue and get sob with activities No edema/orthopnea Eating decent but not great Weight loss 5 pounds the last month No night sweat/back pain/joint pain No dysuria but urinating more frequently at night  Seen ed for jaundice/generalized itch 5/21; lft very elevated and tbili 4's. So here for f/u  No new medication Not taking any medication at all  Allergies  Allergen Reactions  . Vancomycin Rash    Patient on vancomycin for 3 weeks, developed rash on his lower extremities. Did not resemble red man syndrome. Required change of abx  Outpatient Medications Prior to Visit  Medication Sig Dispense Refill  . furosemide (LASIX) 20 MG tablet Take 1 tablet (20 mg total) by mouth daily. Prn swelling (Patient not taking: Reported on 07/30/2020) 30 tablet 1   No facility-administered medications prior to visit.     Social History   Socioeconomic History  . Marital status: Single    Spouse name: Not on file  . Number of children: Not on  file  . Years of education: Not on file  . Highest education level: Not on file  Occupational History  . Not on file  Tobacco Use  . Smoking status: Current Every Day Smoker    Packs/day: 1.00    Years: 10.00    Pack years: 10.00    Types: Cigarettes  . Smokeless tobacco: Never Used  Vaping Use  . Vaping Use: Never used  Substance and Sexual Activity  . Alcohol use: Yes    Alcohol/week: 12.0 standard drinks    Types: 12 Cans of beer per week    Comment: 12 beers daily  . Drug use: Yes    Frequency: 6.0 times per week    Types: Marijuana, Cocaine, Heroin, Fentanyl, IV    Comment: daily use cocaine (snort)  . Sexual activity: Not Currently  Other Topics Concern  . Not on file  Social History Narrative  . Not on file   Social Determinants of Health   Financial Resource Strain: Not on file  Food Insecurity: Not on file  Transportation Needs: Not on file  Physical Activity: Not on file  Stress: Not on file  Social Connections: Not on file  Intimate Partner Violence: Not on file      Review of Systems   all other ros negative  Objective:    There were no vitals taken for this visit. Nursing note and vital signs reviewed.  Physical Exam General/constitutional: no distress, pleasant; much thinner than around 02/2020 HEENT: Normocephalic, PER, Conj Clear, EOMI, Oropharynx clear Neck supple CV: rrr no mrg Lungs: clear to auscultation, normal respiratory effort Abd: Soft, Nontender Ext: no edema Skin: ulcerating tender and macularpapular rash resolved in bilateral LE. Neuro: nonfocal MSK: no peripheral joint swelling/tenderness/warmth; back spines nontender         Labs: reviewed Lab Results  Component Value Date   WBC 8.6 07/30/2020   HGB 13.1 07/30/2020   HCT 38.3 (L) 07/30/2020   MCV 88.0 07/30/2020   PLT 193 07/30/2020   Last metabolic panel Lab Results  Component Value Date   GLUCOSE 93 07/30/2020   NA 135 07/30/2020   K 4.2 07/30/2020   CL  106 07/30/2020   CO2 22 07/30/2020   BUN 13 07/30/2020   CREATININE 0.74 07/30/2020   GFRNONAA >60 07/30/2020   GFRAA >60 12/15/2019   CALCIUM 9.0 07/30/2020   PHOS 4.4 02/10/2020   PROT 7.7 07/30/2020   ALBUMIN 3.5 07/30/2020   BILITOT 8.2 (H) 07/30/2020   ALKPHOS 158 (H) 07/30/2020   AST 752 (H) 07/30/2020   ALT 585 (H) 07/30/2020   ANIONGAP 7 07/30/2020    Lab Results  Component Value Date   INR 1.2 04/28/2020   INR 1.3 (H) 02/15/2020   INR 1.3 (H) 02/08/2020    Micro: 04/28/20 bcx neg 12/06 bcx negative 11/28 bcx negative 10/06, 10/10, 10/26 bcx negative 10/04 bcx mrsa  Serology: 12/2019 hiv neg  Pathology: 2/22 skin biopsy FINAL MICROSCOPIC DIAGNOSIS:   A.  SKIN, LEFT LOWER LEG BELOW KNEE, BIOPSY: PERIVASCULAR DERMATITIS  WITH EXTRAVASATED ERYTHROCYTES AND HEMOSIDERIN DEPOSITION   MICROSCOPIC DESCRIPTION: There is a sparse superficial perivascular  lymphocytic infiltrate with scattered extravasated erythrocytes. The  epidermis is intact. Special stains for microorganisms (PAS and Gram)  are negative. An iron stain highlights hemosiderin deposition. There is  no evidence of vasculitis in the tissue examined. Multiple deeper  sections are reviewed.   COMMENT: The findings are not completely diagnostic but may be seen in  some viral exanthems or morbilliform drug eruptions. In some instances,  drugs that alter platelet function can accentuate the erythrocyte  extravasation.   Imaging: 05/26/20 [place] elastography abdomen ULTRASOUND ABDOMEN: Cirrhotic hepatic morphology without ascites. No focal hepatic lesion visualized.  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 Left ventricular ejection fraction, by estimation, is 60 to 65%. The  left ventricle has normal function. The left ventricle has no regional  wall motion abnormalities. Left ventricular diastolic parameters are   indeterminate.  2. Right ventricular systolic function is normal. The right ventricular  size is severely enlarged.  3. Right atrial size was severely dilated.  4. The mitral valve is normal in structure. No evidence of mitral valve  regurgitation. No evidence of mitral stenosis.  5. Tricuspid valve vegetation, mobile mass (1.1 x 0.6 cm). The tricuspid  valve is abnormal. Tricuspid valve regurgitation is torrential.  6. The aortic valve is normal in structure. Aortic valve regurgitation is  not visualized. No aortic stenosis is present.  7. The inferior vena cava is dilated in size with <50% respiratory  variability, suggesting right atrial pressure of 15 mmHg.   2/17 cxr 1. Interim near complete clearing of bilateral pulmonary infiltrates. 2.  Cardiomegaly.  No pulmonary venous congestion.  12/01 mr lumbar thoracic spine 1. No evidence of discitis-osteomyelitis or epidural abscess. 2. No significant spinal canal or neural foraminal stenosis at any level. 3. Bilateral pleural effusion. Please refer to CT angiogram of the chest performed on February 07, 2020 for additional chest findings.  11/28 chest cta 1. No evidence for acute pulmonary embolus. 2. Multifocal bilateral peripheral predominant areas of nodularity, scarring, and architectural distortion consistent with sequelae of previous septic emboli. Most of the previously noted nodules demonstrate interval improvement from previous exam. However, there is a new cavitary lung nodule within the posterior right lower lobe measuring 1.9 cm. 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 3. Enlarged right atrium and right ventricle with reflux of contrast material into the IVC and hepatic veins consistent with passive venous congestion due to right heart failure.  11/28 tte 1. Left ventricular ejection fraction, by estimation, is 60 to 65%. The  left ventricle has normal function. The left ventricle has no  regional  wall motion abnormalities. There is mild left ventricular hypertrophy.  Left ventricular diastolic parameters  were normal. There is the interventricular septum is flattened in systole  and diastole, consistent with right ventricular pressure and volume  overload.  2. Coarse trabeculation of the RV apex. Right ventricular systolic  function is mildly reduced. The right ventricular size is severely  enlarged.  3. Right atrial size was severely dilated.  4. The mitral valve is abnormal. Trivial mitral valve regurgitation.  There is mild late systolic prolapse of multiple scallops of the posterior  leaflet of the mitral valve.  5. Mobile vegetations noted on the posterior and anterior TV leaflets  (measures 1.5 x 0.7 cm posterior and up to 2.21 x 0.5  cm on the anterior  leaflet).. The tricuspid valve is abnormal. Tricuspid valve regurgitation  is severe.  6. The aortic valve is tricuspid. Aortic valve regurgitation is not  visualized.  7. The inferior vena cava is dilated in size with <50% respiratory  variability, suggesting right atrial pressure of 15 mmHg.    10/08 mr lumbar thoracic 1. No evidence for osteomyelitis discitis or septic arthritis within the thoracic or lumbar spine. No epidural abscess or other intraspinal infection. 2. Diffuse edema throughout the lower lumbar posterior paraspinous musculature, with similar changes along the posterior margins of the psoas musculature bilaterally. Findings suggestive of acute myositis, likely infectious in nature. No loculated soft tissue collections or abscess. 3. Large irregular bilateral pleural effusions with associated cystic/cavitary changes within the partially visualized lungs, consistent with known history of septic emboli. 4. Shallow left subarticular to foraminal disc protrusion at L5-S1 with resultant mild left foraminal and left lateral recess stenosis.  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 His rash perhaps might have gotten a little better, then started on doxy for what was thought to be subsequent pna/lung involvement and rash became worse. He did have a skin biopsy 04/2020 which is "not completely diagnostic but suggest drug eruption." I had ordered cryoglobulinemia testing but somehow the order was cancelled 04/2020. so again, the rash seems more consistent with probably vasculitis due to staph aureus vs hep c related vs drug eruption (and ?which drug)  His crp has overall continued to improve An tte repeat 04/2020 showed normal lv/rv function, but severely dilated right RV; normal MV; persistent TV vegetation 1x0.6 cm, and severe TR with dilated ICV and <50% respiratory variability Mri early 02/2020 of lumbar thoracic spine no longer mention myositis changes  He had finished mrsa treatment/pulm absess-emboli mid 03/2020  08/15/2020 assessment During the last visit 05/2020 he presented with ongoing uncontrolled right heart failure and volume overload. His lft was high. I thought it was related to heart failure. While his elastography in 05/2020 suggest cirrhosis, I suspect congestive hepatopathy and I had referred him back to cardiology to optimize and repeat liver imaging/elastography to determine proper course of hep c treatment  He hasn't done so   I would like to treat his hep c but these are the lingering issues that need to be  addressed: 1) while he doesn't appear grossly volume overload today, does he have right heart failure contributing to hepatopathy 2) once cardiology input regarding volume overload, will need cardiothoracic surgery input regarding severe TR and if need to fix tricuspid valve; we don't want his hep c treatment interupted by cardiac issue and will need to optimize cardiac issue as well to prevent future cirrhosis due to that 3) hepatology input for complexity of case and second opinion vs further workup prior to hep c treatment   His recent lft show severely elevated lft and oderately elevated bilirubin. I continue to suspect a big component of congestive hepatopathy. He should have cardiology input, and potentially CT  surgery input as well. For his hep c, due to complexity of case, I will refer him to [facility] for further management in terms of potential liver biopsy and ultimately hep c treatment  At this time his rash had resolved and acutely I do not see need for immediate hep c treatment. As above, further cardiac optimization and stratification of liver health would be beneficial at this time    -refer to cardiology, cardiothoracic surgery, and external ([facility]) hepatology -repeat tte -labs to include bnp, hepatitis b, cbc, cmp, inr, fibrinogen -follow up with me in 4-6 weeks to close loops on current w/u and referral -if severe short of breath, confusion/persistent nausea-abd pain go ER  -cholestyramine for itch    Follow-up: 2-4 weeks  I have spent a total of 20 minutes of face-to-face and non-face-to-face time, excluding clinical staff time, preparing to see patient, ordering tests and/or medications, and provide counseling the patient     Raymondo Band, MD [facility] -- -- pager   [phone] cell 08/15/2020, 1:48 PM

## 2020-08-15 NOTE — Addendum Note (Signed)
Addended by: Harley Alto on: 08/15/2020 04:35 PM   Modules accepted: Orders

## 2020-08-15 NOTE — Patient Instructions (Addendum)
You have bad liver function, potentially cirrhosis.  It is unclear if the liver issue is related to hep c or heart failure (I am worried the latter). You also have severe heart valve dysfunction and can contribute to heart failure  We need to treat your hep c, but heart failure and heart valve optimization are required before this. Liver health assessment is adversely affected by heart failure   I have referred you to cardiology, cardiothoracic surgery to evaluate the heart failure and heart valve issue  I have also referred you to [facility] so when you are ready for hep c treatment, perhaps they can help, if needed  Blood test today Repeat echo ordered  See me in 4-6 weeks to follow on our progress  Take cholestyramine 4 gram packet twice a day for itch. If it helps but desire more control can increase to 3 times a day. Maximal is 8 gram twice a day

## 2020-08-15 NOTE — Addendum Note (Signed)
Addended byRutha Bouchard T on: 08/15/2020 02:42 PM   Modules accepted: Orders

## 2020-08-16 LAB — COMPLETE METABOLIC PANEL WITH GFR
AG Ratio: 1.2 (calc) (ref 1.0–2.5)
ALT: 260 U/L — ABNORMAL HIGH (ref 9–46)
AST: 236 U/L — ABNORMAL HIGH (ref 10–40)
Albumin: 4.1 g/dL (ref 3.6–5.1)
Alkaline phosphatase (APISO): 120 U/L (ref 36–130)
BUN: 10 mg/dL (ref 7–25)
CO2: 22 mmol/L (ref 20–32)
Calcium: 9.4 mg/dL (ref 8.6–10.3)
Chloride: 108 mmol/L (ref 98–110)
Creat: 0.76 mg/dL (ref 0.60–1.35)
GFR, Est African American: 138 mL/min/{1.73_m2} (ref >=60)
GFR, Est Non African American: 119 mL/min/{1.73_m2} (ref >=60)
Globulin: 3.3 g/dL (calc) (ref 1.9–3.7)
Glucose, Bld: 83 mg/dL (ref 65–99)
Potassium: 5 mmol/L (ref 3.5–5.3)
Sodium: 138 mmol/L (ref 135–146)
Total Bilirubin: 4 mg/dL — ABNORMAL HIGH (ref 0.2–1.2)
Total Protein: 7.4 g/dL (ref 6.1–8.1)

## 2020-08-16 LAB — CBC
HCT: 38.4 % — ABNORMAL LOW (ref 38.5–50.0)
Hemoglobin: 12.7 g/dL — ABNORMAL LOW (ref 13.2–17.1)
MCH: 29.5 pg (ref 27.0–33.0)
MCHC: 33.1 g/dL (ref 32.0–36.0)
MCV: 89.3 fL (ref 80.0–100.0)
MPV: 13.7 fL — ABNORMAL HIGH (ref 7.5–12.5)
Platelets: 210 10*3/uL (ref 140–400)
RBC: 4.3 10*6/uL (ref 4.20–5.80)
RDW: 15 % (ref 11.0–15.0)
WBC: 6.6 10*3/uL (ref 3.8–10.8)

## 2020-08-16 LAB — HEPATITIS B SURFACE ANTIGEN: Hepatitis B Surface Ag: REACTIVE — AB

## 2020-08-16 LAB — HEPATITIS B CORE ANTIBODY, TOTAL: Hep B Core Total Ab: REACTIVE — AB

## 2020-08-16 LAB — C-REACTIVE PROTEIN: CRP: 5.6 mg/L (ref <8.0)

## 2020-08-16 LAB — PROTIME-INR
INR: 1
Prothrombin Time: 10.5 s (ref 9.0–11.5)

## 2020-08-16 LAB — HEPATITIS B SURFACE ANTIBODY, QUANTITATIVE: Hep B S AB Quant (Post): <5 m[IU]/mL — ABNORMAL LOW (ref >=10)

## 2020-08-16 LAB — FIBRINOGEN: Fibrinogen: 299 mg/dL (ref 175–425)

## 2020-08-17 ENCOUNTER — Encounter: Payer: Self-pay | Admitting: Internal Medicine

## 2020-08-17 DIAGNOSIS — B1910 Unspecified viral hepatitis B without hepatic coma: Secondary | ICD-10-CM

## 2020-08-17 HISTORY — DX: Unspecified viral hepatitis B without hepatic coma: B19.10

## 2020-09-01 NOTE — Telephone Encounter (Signed)
Attempted to reach patient regarding labs and referrals placed that patient no-showed. Unable to reach patient at this time. Patient has follow up appointment with [facility] on 09/28/20. Will send patient another Mychart.  Valarie Cones

## 2020-09-28 ENCOUNTER — Telehealth: Payer: Self-pay

## 2020-09-28 ENCOUNTER — Ambulatory Visit: Payer: Self-pay | Admitting: Internal Medicine

## 2020-09-28 NOTE — Telephone Encounter (Signed)
Attempted to contact patient today due to missed appointment. No voicemail service available.  Also [facility] has been unable to contact patient to schedule cardio referral.   A new referral will have to be placed in the future.   Routing to MD as Durwin Reges

## 2020-09-28 NOTE — Telephone Encounter (Signed)
Left voice message in regards to appointment today to see if patient is able to make it at 1:45 PM with Dr.Vu and left call back number to reschedule.   Elisabet Gutzmer Lesli Albee, CMA

## 2020-09-29 ENCOUNTER — Telehealth: Payer: Self-pay | Admitting: Internal Medicine

## 2020-09-29 ENCOUNTER — Other Ambulatory Visit: Payer: Self-pay | Admitting: Internal Medicine

## 2020-09-29 DIAGNOSIS — B182 Chronic viral hepatitis C: Secondary | ICD-10-CM

## 2020-09-29 DIAGNOSIS — K7460 Unspecified cirrhosis of liver: Secondary | ICD-10-CM

## 2020-09-29 NOTE — Progress Notes (Signed)
Referal placed for GI for hep c/cirrhosis evaluation and treatment

## 2020-09-29 NOTE — Telephone Encounter (Signed)
Referal to gi/cards for hx chf from tv endocarditis and concern of congestive hepatopathy  Cirrohsis/advance liver fibrosis; chronic hep c needs gi care  Discharge from [facility] clinic

## 2021-01-27 IMAGING — DX DG CHEST 1V PORT
1 series · 1 of 1 positions shown · non-contrast
Comparison: 01/05/2020

CLINICAL DATA: Questionable sepsis.  Evaluate for abnormality.

EXAM:
PORTABLE CHEST 1 VIEW

[chest ap]
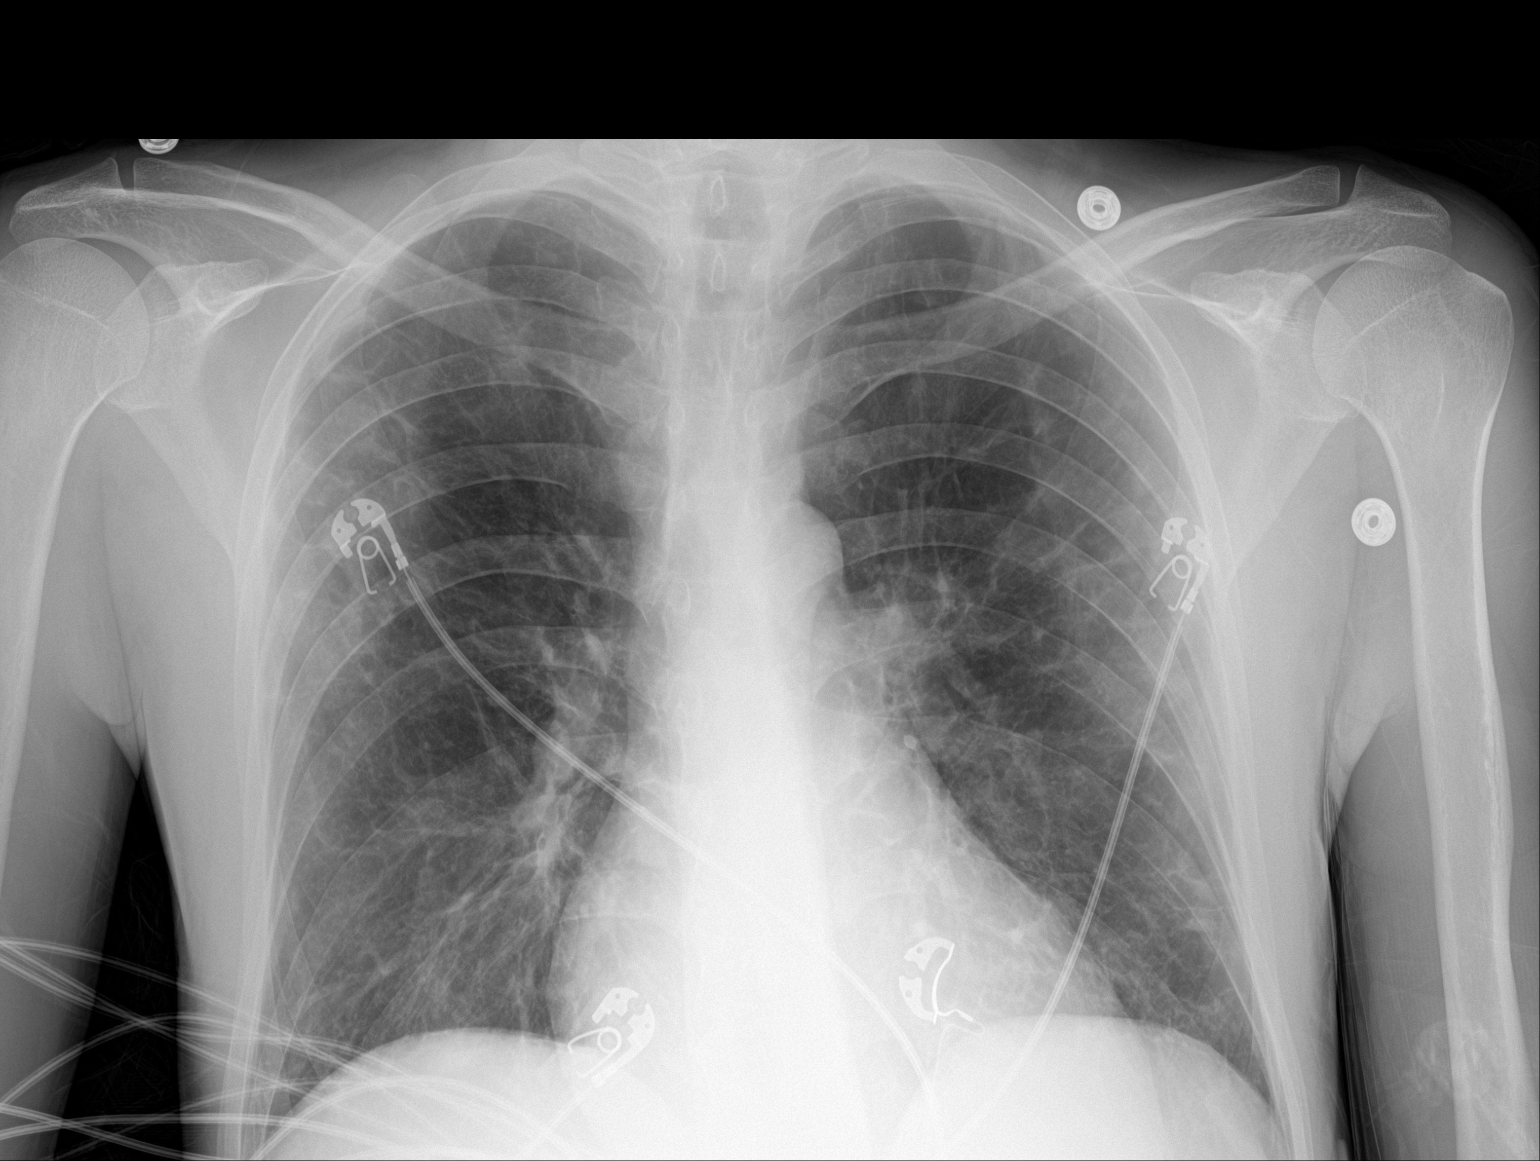

[1 of 1 positions shown; findings below may reference images not displayed]

FINDINGS: The heart size appears within normal limits. No pleural effusion
identified. Bilateral peripheral predominant scarring with multiple
upper lobe predominant cystic lucencies likely representing
postinflammatory/infectious pneumatoceles.
IMPRESSION: Bilateral peripheral predominant scarring with multiple upper lobe
predominant cystic lucencies likely representing
postinflammatory/infectious pneumatoceles.

## 2021-04-11 ENCOUNTER — Emergency Department (HOSPITAL_COMMUNITY)
Admission: EM | Admit: 2021-04-11 | Discharge: 2021-04-12 | Disposition: A | Discharge status: Home / Self Care | Payer: Self-pay | Attending: Emergency Medicine | Admitting: Emergency Medicine

## 2021-04-11 DIAGNOSIS — J45909 Unspecified asthma, uncomplicated: Secondary | ICD-10-CM | POA: Insufficient documentation

## 2021-04-11 DIAGNOSIS — T402X1A Poisoning by other opioids, accidental (unintentional), initial encounter: Secondary | ICD-10-CM | POA: Insufficient documentation

## 2021-04-11 DIAGNOSIS — T50901A Poisoning by unspecified drugs, medicaments and biological substances, accidental (unintentional), initial encounter: Secondary | ICD-10-CM

## 2021-04-11 DIAGNOSIS — F424 Excoriation (skin-picking) disorder: Secondary | ICD-10-CM | POA: Insufficient documentation

## 2021-04-11 DIAGNOSIS — R21 Rash and other nonspecific skin eruption: Secondary | ICD-10-CM | POA: Insufficient documentation

## 2021-04-11 DIAGNOSIS — I509 Heart failure, unspecified: Secondary | ICD-10-CM | POA: Insufficient documentation

## 2021-04-11 LAB — BASIC METABOLIC PANEL
Anion gap: 9 (ref 5–15)
BUN: 22 mg/dL — ABNORMAL HIGH (ref 6–20)
CO2: 20 mmol/L — ABNORMAL LOW (ref 22–32)
Calcium: 8.8 mg/dL — ABNORMAL LOW (ref 8.9–10.3)
Chloride: 110 mmol/L (ref 98–111)
Creatinine, Ser: 0.88 mg/dL (ref 0.61–1.24)
GFR, Estimated: >60 mL/min (ref >60)
Glucose, Bld: 84 mg/dL (ref 70–99)
Potassium: 3.9 mmol/L (ref 3.5–5.1)
Sodium: 139 mmol/L (ref 135–145)

## 2021-04-11 LAB — CBC
HCT: 41.6 % (ref 39.0–52.0)
Hemoglobin: 13.9 g/dL (ref 13.0–17.0)
MCH: 31.4 pg (ref 26.0–34.0)
MCHC: 33.4 g/dL (ref 30.0–36.0)
MCV: 93.9 fL (ref 80.0–100.0)
Platelets: 190 10*3/uL (ref 150–400)
RBC: 4.43 MIL/uL (ref 4.22–5.81)
RDW: 13.3 % (ref 11.5–15.5)
WBC: 8.5 10*3/uL (ref 4.0–10.5)
nRBC: 0 % (ref 0.0–0.2)

## 2021-04-11 LAB — SALICYLATE LEVEL: Salicylate Lvl: <7 mg/dL — ABNORMAL LOW (ref 7.0–30.0)

## 2021-04-11 LAB — ETHANOL: Alcohol, Ethyl (B): <10 mg/dL (ref <10)

## 2021-04-11 NOTE — ED Notes (Signed)
Patient currently in custody of [employer]

## 2021-04-11 NOTE — ED Triage Notes (Addendum)
Patient was brought in by EMS after [employer] was called out for larceny at  [place]. Patient stated he injected a cocktail of substances, that was " brown and white". PD gave 4mg  narcan IN.

## 2021-04-11 NOTE — ED Notes (Signed)
Unable to answer remaining triage question due to patients altered mental status.

## 2021-04-11 NOTE — ED Provider Notes (Signed)
[facility]-EMERGENCY DEPT Provider Note   CSN: [id number] Arrival date & time: 04/11/21  2051     History  No chief complaint on file.   Alex [name] is a 36 y.o. male.  Patient is a 36 year old male with a history of polysubstance abuse with IV heroin use and prior CHF and endocarditis, depression and asthma who is presenting today with police.  It was reported that patient was trying to steal money from sheets and then stayed for 30 more minutes and then injected some type of cocktail which he told police and became minimally responsive.  He did receive Narcan in the field with brief episode of waking up and then went back to sleep.  Unclear what he injected.  Patient is sleeping on exam and will mildly move his arm and examination but patient will not wake up.  The history is provided by the police.      Home Medications Prior to Admission medications   Medication Sig Start Date End Date Taking? Authorizing Provider  cholestyramine Lanetta Inch) 4 GM/DOSE powder Take 1 packet (4 g total) by mouth 2 (two) times daily with a meal. 08/15/20   Vu, Gershon Mussel T, MD  furosemide (LASIX) 20 MG tablet Take 1 tablet (20 mg total) by mouth daily. Prn swelling 06/02/20   Anders Simmonds, PA-C  albuterol (PROVENTIL HFA) 108 (90 Base) MCG/ACT inhaler Inhale 1-2 puffs into the lungs every 4 (four) hours as needed for wheezing or shortness of breath. Patient not taking: Reported on 02/25/2020 09/08/18 04/28/20  Wieters, Hallie C, PA-C  ipratropium-albuterol (DUONEB) 0.5-2.5 (3) MG/3ML SOLN Take 3 mLs by nebulization every 6 (six) hours as needed (wheezing). Patient not taking: Reported on 12/28/2018 04/14/18 12/28/18  Elpidio Anis, PA-C  montelukast (SINGULAIR) 10 MG tablet Take 1 tablet (10 mg total) by mouth at bedtime. Patient not taking: Reported on 12/28/2018 07/25/17 12/28/18  Linus Mako B, NP  spironolactone (ALDACTONE) 25 MG tablet Take 0.5 tablets (12.5 mg total) by mouth  daily. 01/19/20 02/12/20  Arrien, York Ram, MD  traZODone (DESYREL) 50 MG tablet Take 1 tablet (50 mg total) by mouth at bedtime and may repeat dose one time if needed. Patient not taking: Reported on 07/05/2018 04/29/16 12/28/18  Oneta Rack, NP      Allergies    Vancomycin    Review of Systems   Review of Systems  Physical Exam Updated Vital Signs BP 117/89 (BP Location: Left Arm)    Pulse 68    Temp (!) 97.3 F (36.3 C) (Oral)    Resp 15    SpO2 97%  Physical Exam Vitals and nursing note reviewed.  Constitutional:      General: He is not in acute distress.    Appearance: He is well-developed.     Comments: Lethargic but does move to stimulation.  He does not wake up and speak.  He is sleeping comfortably.  HENT:     Head: Normocephalic and atraumatic.  Eyes:     Conjunctiva/sclera: Conjunctivae normal.     Pupils: Pupils are equal, round, and reactive to light.  Cardiovascular:     Rate and Rhythm: Normal rate and regular rhythm.     Heart sounds: No murmur heard. Pulmonary:     Effort: Pulmonary effort is normal. No respiratory distress.     Breath sounds: Normal breath sounds. No wheezing or rales.  Abdominal:     General: There is no distension.     Palpations: Abdomen  is soft.     Tenderness: There is no abdominal tenderness. There is no guarding or rebound.  Musculoskeletal:        General: No tenderness. Normal range of motion.     Cervical back: Normal range of motion and neck supple.     Comments: Numerous track marks over the left antecubital fossa.  Skin:    General: Skin is warm and dry.     Findings: No erythema or rash.     Comments: Maculopapular rash over the arms and legs.  Mild excoriation.  Neurological:     Comments: Lethargic but does respond to stimuli    ED Results / Procedures / Treatments   Labs (all labs ordered are listed, but only abnormal results are displayed) Labs Reviewed  BASIC METABOLIC PANEL - Abnormal; Notable for the  following components:      Result Value   CO2 20 (*)    BUN 22 (*)    Calcium 8.8 (*)    All other components within normal limits  SALICYLATE LEVEL - Abnormal; Notable for the following components:   Salicylate Lvl <7.0 (*)    All other components within normal limits  CBC  ETHANOL  RAPID URINE DRUG SCREEN, HOSP PERFORMED    EKG None  Radiology No results found.  Procedures Procedures    Medications Ordered in ED Medications - No data to display  ED Course/ Medical Decision Making/ A&P                           Medical Decision Making Amount and/or Complexity of Data Reviewed Labs: ordered.   36 year old male presenting with abnormal mental status after injecting an unknown substance.  Patient was at the sheets and attempted to rob them.  He was given Narcan and woke up briefly but then went right back to sleep and he is not appropriate for jail at this time.  Patient is somnolent but vital signs are within normal limits.  Prior external medical records were reviewed and last hospital physician for endocarditis, CHF from ongoing IV heroin use.  Patient is in no acute distress at this time on room air oxygenating 97%.  Patient will need ongoing monitoring and continuous cardiac and pulse ox monitoring until he is more lucid.  He is currently not medically clear.  I independently interpreted patient's labs and his BMP today without acute findings, CBC within normal limits, EtOH and salicylates are negative.        Final Clinical Impression(s) / ED Diagnoses Final diagnoses:  None    Rx / DC Orders ED Discharge Orders     None         Gwyneth Sprout, MD 04/11/21 [phone]

## 2021-04-12 NOTE — ED Provider Notes (Signed)
°  Provider Note MRN:  [id number]  Arrival date & time: 04/12/21    ED Course and Medical Decision Making  Assumed care from Dr. Anitra Lauth at shift change.  Patient presenting in opioid toxidrome, responding well to Narcan.  Still a bit somnolent.  Plan is to observe and monitor and when no longer at risk for severe presedation can be discharged into police custody.  At the 4-hour mark of observation patient is showing no evidence of oversedation or continued toxidrome.  Appropriate for discharge.  Procedures  Final Clinical Impressions(s) / ED Diagnoses     ICD-10-CM   1. Accidental overdose, initial encounter  T50.901A       ED Discharge Orders     None         Discharge Instructions      You were evaluated in the Emergency Department and after careful evaluation, we did not find any emergent condition requiring admission or further testing in the hospital.  Your exam/testing today was overall reassuring.  Please return to the Emergency Department if you experience any worsening of your condition.  Thank you for allowing [place] to be a part of your care.       Elmer Sow. Pilar Plate, MD [facility] Emergency Medicine [facility] [email or web address]    Sabas Sous, MD 04/12/21 [phone]

## 2021-04-12 NOTE — Discharge Instructions (Signed)
You were evaluated in the Emergency Department and after careful evaluation, we did not find any emergent condition requiring admission or further testing in the hospital.  Your exam/testing today was overall reassuring.  Please return to the Emergency Department if you experience any worsening of your condition.  Thank you for allowing us to be a part of your care.  

## 2021-04-12 NOTE — ED Notes (Signed)
Patient discharged in police custody to jail.
